# Patient Record
Sex: Male | Born: 1955 | Race: White | Hispanic: No | Marital: Married | Smoking: Never smoker
Health system: Southern US, Community
[De-identification: ages and names within clinical notes are randomized; demographics above are authoritative.]

## PROBLEM LIST (undated history)

## (undated) DIAGNOSIS — E785 Hyperlipidemia, unspecified: Secondary | ICD-10-CM

## (undated) DIAGNOSIS — E559 Vitamin D deficiency, unspecified: Secondary | ICD-10-CM

## (undated) DIAGNOSIS — E875 Hyperkalemia: Secondary | ICD-10-CM

## (undated) DIAGNOSIS — E46 Unspecified protein-calorie malnutrition: Secondary | ICD-10-CM

## (undated) DIAGNOSIS — I10 Essential (primary) hypertension: Secondary | ICD-10-CM

## (undated) DIAGNOSIS — M792 Neuralgia and neuritis, unspecified: Secondary | ICD-10-CM

## (undated) DIAGNOSIS — A419 Sepsis, unspecified organism: Secondary | ICD-10-CM

## (undated) DIAGNOSIS — F32A Depression, unspecified: Secondary | ICD-10-CM

## (undated) DIAGNOSIS — F329 Major depressive disorder, single episode, unspecified: Secondary | ICD-10-CM

## (undated) DIAGNOSIS — E538 Deficiency of other specified B group vitamins: Secondary | ICD-10-CM

## (undated) DIAGNOSIS — M51379 Other intervertebral disc degeneration, lumbosacral region without mention of lumbar back pain or lower extremity pain: Secondary | ICD-10-CM

## (undated) DIAGNOSIS — E8809 Other disorders of plasma-protein metabolism, not elsewhere classified: Secondary | ICD-10-CM

## (undated) DIAGNOSIS — M5137 Other intervertebral disc degeneration, lumbosacral region: Secondary | ICD-10-CM

## (undated) DIAGNOSIS — K5900 Constipation, unspecified: Secondary | ICD-10-CM

## (undated) DIAGNOSIS — D62 Acute posthemorrhagic anemia: Secondary | ICD-10-CM

## (undated) DIAGNOSIS — K649 Unspecified hemorrhoids: Secondary | ICD-10-CM

## (undated) DIAGNOSIS — N319 Neuromuscular dysfunction of bladder, unspecified: Secondary | ICD-10-CM

## (undated) DIAGNOSIS — G894 Chronic pain syndrome: Secondary | ICD-10-CM

## (undated) DIAGNOSIS — N179 Acute kidney failure, unspecified: Secondary | ICD-10-CM

## (undated) DIAGNOSIS — S2242XA Multiple fractures of ribs, left side, initial encounter for closed fracture: Secondary | ICD-10-CM

## (undated) DIAGNOSIS — K592 Neurogenic bowel, not elsewhere classified: Secondary | ICD-10-CM

## (undated) DIAGNOSIS — M109 Gout, unspecified: Secondary | ICD-10-CM

## (undated) DIAGNOSIS — K920 Hematemesis: Secondary | ICD-10-CM

## (undated) DIAGNOSIS — R6521 Severe sepsis with septic shock: Secondary | ICD-10-CM

## (undated) DIAGNOSIS — F419 Anxiety disorder, unspecified: Secondary | ICD-10-CM

## (undated) HISTORY — DX: Deficiency of other specified B group vitamins: E53.8

## (undated) HISTORY — DX: Neuralgia and neuritis, unspecified: M79.2

## (undated) HISTORY — DX: Neurogenic bowel, not elsewhere classified: K59.2

## (undated) HISTORY — DX: Other intervertebral disc degeneration, lumbosacral region without mention of lumbar back pain or lower extremity pain: M51.379

## (undated) HISTORY — DX: Gout, unspecified: M10.9

## (undated) HISTORY — DX: Neuromuscular dysfunction of bladder, unspecified: N31.9

## (undated) HISTORY — DX: Anxiety disorder, unspecified: F41.9

## (undated) HISTORY — DX: Depression, unspecified: F32.A

## (undated) HISTORY — DX: Other disorders of plasma-protein metabolism, not elsewhere classified: E88.09

## (undated) HISTORY — DX: Chronic pain syndrome: G89.4

## (undated) HISTORY — DX: Acute posthemorrhagic anemia: D62

## (undated) HISTORY — DX: Unspecified hemorrhoids: K64.9

## (undated) HISTORY — DX: Vitamin D deficiency, unspecified: E55.9

## (undated) HISTORY — DX: Hyperlipidemia, unspecified: E78.5

## (undated) HISTORY — DX: Constipation, unspecified: K59.00

## (undated) HISTORY — DX: Unspecified protein-calorie malnutrition: E46

## (undated) HISTORY — PX: BACK SURGERY: SHX140

## (undated) HISTORY — DX: Acute kidney failure, unspecified: N17.9

## (undated) HISTORY — DX: Multiple fractures of ribs, left side, initial encounter for closed fracture: S22.42XA

## (undated) HISTORY — DX: Essential (primary) hypertension: I10

## (undated) HISTORY — PX: TONSILLECTOMY: SUR1361

## (undated) HISTORY — DX: Major depressive disorder, single episode, unspecified: F32.9

## (undated) HISTORY — DX: Other intervertebral disc degeneration, lumbosacral region: M51.37

## (undated) HISTORY — DX: Severe sepsis with septic shock: A41.9

## (undated) HISTORY — DX: Hyperkalemia: E87.5

## (undated) HISTORY — DX: Hematemesis: K92.0

## (undated) HISTORY — DX: Sepsis, unspecified organism: A41.9

## (undated) HISTORY — DX: Sepsis, unspecified organism: R65.21

## (undated) HISTORY — PX: APPENDECTOMY: SHX54

---

## 2000-01-16 ENCOUNTER — Emergency Department (HOSPITAL_COMMUNITY): Admission: EM | Admit: 2000-01-16 | Discharge: 2000-01-16 | Payer: Self-pay | Admitting: Emergency Medicine

## 2000-01-16 ENCOUNTER — Encounter: Payer: Self-pay | Admitting: Emergency Medicine

## 2001-09-22 ENCOUNTER — Encounter: Payer: Self-pay | Admitting: *Deleted

## 2001-09-22 ENCOUNTER — Ambulatory Visit (HOSPITAL_COMMUNITY): Admission: RE | Admit: 2001-09-22 | Discharge: 2001-09-22 | Payer: Self-pay | Admitting: *Deleted

## 2007-07-17 ENCOUNTER — Encounter: Admission: RE | Admit: 2007-07-17 | Discharge: 2007-07-17 | Payer: Self-pay | Admitting: Orthopedic Surgery

## 2008-06-24 HISTORY — PX: CHOLECYSTECTOMY: SHX55

## 2011-04-24 DIAGNOSIS — S68121A 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, initial encounter: Secondary | ICD-10-CM | POA: Insufficient documentation

## 2013-04-20 DIAGNOSIS — S2242XA Multiple fractures of ribs, left side, initial encounter for closed fracture: Secondary | ICD-10-CM

## 2013-04-20 HISTORY — DX: Multiple fractures of ribs, left side, initial encounter for closed fracture: S22.42XA

## 2013-04-22 ENCOUNTER — Encounter: Payer: Self-pay | Admitting: *Deleted

## 2013-04-22 DIAGNOSIS — E559 Vitamin D deficiency, unspecified: Secondary | ICD-10-CM | POA: Insufficient documentation

## 2013-04-22 DIAGNOSIS — S2242XA Multiple fractures of ribs, left side, initial encounter for closed fracture: Secondary | ICD-10-CM

## 2013-04-22 DIAGNOSIS — F329 Major depressive disorder, single episode, unspecified: Secondary | ICD-10-CM | POA: Insufficient documentation

## 2013-04-22 DIAGNOSIS — M109 Gout, unspecified: Secondary | ICD-10-CM | POA: Insufficient documentation

## 2013-04-22 DIAGNOSIS — E785 Hyperlipidemia, unspecified: Secondary | ICD-10-CM | POA: Insufficient documentation

## 2013-04-22 DIAGNOSIS — I10 Essential (primary) hypertension: Secondary | ICD-10-CM | POA: Insufficient documentation

## 2013-04-22 DIAGNOSIS — F418 Other specified anxiety disorders: Secondary | ICD-10-CM | POA: Insufficient documentation

## 2013-04-22 DIAGNOSIS — E538 Deficiency of other specified B group vitamins: Secondary | ICD-10-CM | POA: Insufficient documentation

## 2013-04-22 DIAGNOSIS — M5137 Other intervertebral disc degeneration, lumbosacral region: Secondary | ICD-10-CM | POA: Insufficient documentation

## 2013-04-22 DIAGNOSIS — F419 Anxiety disorder, unspecified: Secondary | ICD-10-CM | POA: Insufficient documentation

## 2013-04-22 DIAGNOSIS — K649 Unspecified hemorrhoids: Secondary | ICD-10-CM | POA: Insufficient documentation

## 2013-04-23 ENCOUNTER — Other Ambulatory Visit: Payer: Self-pay | Admitting: *Deleted

## 2013-04-23 ENCOUNTER — Institutional Professional Consult (permissible substitution) (INDEPENDENT_AMBULATORY_CARE_PROVIDER_SITE_OTHER): Payer: 59 | Admitting: Cardiothoracic Surgery

## 2013-04-23 ENCOUNTER — Encounter: Payer: Self-pay | Admitting: Cardiothoracic Surgery

## 2013-04-23 ENCOUNTER — Ambulatory Visit
Admission: RE | Admit: 2013-04-23 | Discharge: 2013-04-23 | Disposition: A | Discharge status: Home / Self Care | Payer: 59 | Source: Ambulatory Visit | Attending: Cardiothoracic Surgery | Admitting: Cardiothoracic Surgery

## 2013-04-23 VITALS — BP 124/85 | HR 109 | Resp 19 | Ht 68.0 in | Wt 227.0 lb

## 2013-04-23 DIAGNOSIS — S2249XA Multiple fractures of ribs, unspecified side, initial encounter for closed fracture: Secondary | ICD-10-CM

## 2013-04-23 DIAGNOSIS — S2239XA Fracture of one rib, unspecified side, initial encounter for closed fracture: Secondary | ICD-10-CM

## 2013-04-23 DIAGNOSIS — S2232XA Fracture of one rib, left side, initial encounter for closed fracture: Secondary | ICD-10-CM

## 2013-04-23 MED ORDER — OXYCODONE HCL 5 MG PO TABS: 5.0000 mg | ORAL_TABLET | ORAL | Status: DC | PRN

## 2013-04-23 NOTE — Progress Notes (Addendum)
[address]       [place] [address]             [phone]                    [facility] Medical Record #[id number] Date of Birth: [birth year]  Referring: Konrad Felix, MD Primary Care: Konrad Felix, MD  Chief Complaint:    Chief Complaint  Patient presents with  . Rib Fracture    thoracic rib pain, rib fractures    History of Present Illness:    Patient fell about 6 feet off ladder hitting his left side while cleaning gutters. This occurred on the 10/28. Chest xray in [place] showed multiple rib fractures. Patient doing well since without SOB , some tenderness. No hemoptysis or abdominal pain. Patient to have back surgery by Dr  Jola Babinski in 3 weeks.      Current Activity/ Functional Status:  Patient is independent with mobility/ambulation, transfers, ADL's, IADL's.  Zubrod Score: At the time of surgery this patient's most appropriate activity status/level should be described as: [x]  Normal activity, no symptoms []  Symptoms, fully ambulatory []  Symptoms, in bed less than or equal to 50% of the time []  Symptoms, in bed greater than 50% of the time but less than 100% []  Bedridden []  Moribund   Past Medical History  Diagnosis Date  . Anxiety   . Cobalamin deficiency   . Degeneration of lumbar or lumbosacral intervertebral disc   . Hemorrhoids   . Hyperlipidemia   . Hypertension   . Depression   . Multiple fractures of ribs of left side 04/20/13    FALL FROM A LADDER ON   . Gouty arthropathy, unspecified   . Vitamin D deficiency     Past Surgical History  Procedure Laterality Date  . Cholecystectomy  2010    [facility]  Marland Kitchen Appendectomy    . Tonsillectomy      Family History  Problem Relation Age of Onset  . Cancer Mother     BREAST  . Cancer Father     COLON  . Cancer Son     LEUKEMIA    History   Social History  . Marital Status: Married    Spouse Name: N/A    Number of Children: N/A  . Years of  Education: N/A   Occupational History  . Not on file.   Social History Main Topics  . Smoking status: Never Smoker   . Smokeless tobacco: Current User    Types: Chew  . Alcohol Use: Yes     Comment: 6-8 BEERS A NIGHT  . Drug Use: Not on file  . Sexual Activity: Not on file   Other Topics Concern  . Not on file   Social History Narrative  . No narrative on file    History  Smoking status  . Never Smoker   Smokeless tobacco  . Current User  . Types: Chew    History  Alcohol Use  . Yes    Comment: 6-8 BEERS A NIGHT     No Known Allergies  Current Outpatient Prescriptions  Medication Sig Dispense Refill  . allopurinol (ZYLOPRIM) 100 MG tablet Take 300 mg by mouth daily.      . B-12, Methylcobalamin, 1000 MCG SUBL Place 1 tablet under the tongue.      . celecoxib (CELEBREX) 200 MG capsule Take 200 mg by mouth daily. May take one or two daily      .  cholecalciferol (VITAMIN D) 1000 UNITS tablet Take 1,000 Units by mouth daily. TAKE 2,000 UNITS A DAY      . HYDROcodone-acetaminophen (NORCO/VICODIN) 5-325 MG per tablet Take 1 tablet by mouth every 6 (six) hours as needed for pain.      Marland Kitchen lisinopril (PRINIVIL,ZESTRIL) 40 MG tablet Take 40 mg by mouth daily.      . Multiple Vitamin (MULTIVITAMIN) capsule Take 1 capsule by mouth daily.      . mupirocin ointment (BACTROBAN) 2 %       . phenylephrine-shark liver oil-mineral oil-petrolatum (PREPARATION H) 0.25-3-14-71.9 % rectal ointment Place rectally 2 (two) times daily as needed for hemorrhoids.      . pravastatin (PRAVACHOL) 40 MG tablet Take 40 mg by mouth daily.      Marland Kitchen starch (ANUSOL) 51 % suppository Place 1 suppository rectally as needed for pain.      Marland Kitchen venlafaxine (EFFEXOR) 75 MG tablet Take 75 mg by mouth 2 (two) times daily.      Marland Kitchen oxyCODONE (OXY IR/ROXICODONE) 5 MG immediate release tablet Take 1 tablet (5 mg total) by mouth every 4 (four) hours as needed for pain.  60 tablet  0   No current facility-administered  medications for this visit.       Review of Systems:     Cardiac Review of Systems: Y or N  Chest Pain [  y  ]  Resting SOB [n   ] Exertional SOB  [n  ]  Orthopnea [ n ]   Pedal Edema [n   ]    Palpitations [n  ] Syncope  [  n]   Presyncope [  n ]  General Review of Systems: [Y] = yes [  ]=no Constitional: recent weight change [ n ]; anorexia [  ]; fatigue Cove.Etienne  ]; nausea [  n]; night sweats [  ]; fever [ n ]; or chills [ n ];                                                                                                                                          Dental: poor dentition[  ]; Last Dentist visit:   Eye : blurred vision [  ]; diplopia [   ]; vision changes [  ];  Amaurosis fugax[  ]; Resp: cough [  ];  wheezing[  ];  hemoptysis[  ]; shortness of breath[n  ]; paroxysmal nocturnal dyspnea[ n ]; dyspnea on exertion[  ]; or orthopnea[  ];  GI:  gallstones[  ], vomiting[  ];  dysphagia[ n ]; melena[  ];  hematochezia [  ]; heartburn[  ];   Hx of  Colonoscopy[  ]; GU: kidney stones [  ]; hematuria[  ];   dysuria [  ];  nocturia[  ];  history of     obstruction [  ]; urinary frequency [ n ]  Skin: rash, swelling[  ];, hair loss[  ];  peripheral edema[  ];  or itching[  ]; Musculosketetal: myalgias[  ];  joint swelling[  ];  joint erythema[  ];  joint pain[  ];  back pain[y  ];  Heme/Lymph: bruising[  ];  bleeding[  ];  anemia[  ];  Neuro: TIA[  ];  headaches[  ];  stroke[  ];  vertigo[  ];  seizures[n  ];   paresthesias[  ];  difficulty walking[n  ];  Psych:depression[  ]; anxiety[  ];  Endocrine: diabetes[  ];  thyroid dysfunction[  ];  Immunizations: Flu [ y ]; Pneumococcal[ n ];  Other:  Physical Exam: BP 124/85  Pulse 109  Resp 19  Ht 5\' 8"  (1.727 m)  Wt 227 lb (102.967 kg)  BMI 34.52 kg/m2  SpO2 97%  General appearance: alert and cooperative Neurologic: intact Heart: regular rate and rhythm, S1, S2 normal, no murmur, click, rub or gallop and normal apical  impulse Lungs: clear to auscultation bilaterally and tenderness left upper ribs, minor brusing over 7-8 ribs Abdomen: soft, non-tender; bowel sounds normal; no masses,  no organomegaly, no tenderness over spleen Extremities: extremities normal, atraumatic, no cyanosis or edema, Homans sign is negative, no sign of DVT and brusing on medial surface of left arm, no joint tenderness brusing over right abdominal wall   Diagnostic Studies & Laboratory data:     Recent Radiology Findings:   Dg Chest 2 View  04/23/2013   CLINICAL DATA:  Left-sided rib fractures.  EXAM: CHEST  2 VIEW  COMPARISON:  Chest x-ray 04/21/2013.  FINDINGS: The left 4th through 8th ribs are fractured. Mild displacement is noted. There is no pneumothorax. The heart size is normal. Mild bibasilar atelectasis is evident. A small effusion is present on the left. Mild degenerative changes are noted in the thoracic spine.  IMPRESSION: 1. Multiple left-sided rib fractures are mildly displaced. 2. No pneumothorax. 3. Small left pleural effusion and associated atelectasis.   Electronically Signed   By: Gennette Pac M.D.   On: 04/23/2013 14:09      Recent Lab Findings: No results found for this basename: WBC, HGB, HCT, PLT, GLUCOSE, CHOL, TRIG, HDL, LDLDIRECT, LDLCALC, ALT, AST, NA, K, CL, CREATININE, BUN, CO2, TSH, INR, GLUF, HGBA1C      Assessment / Plan:     Multiple left rib fractures, conservative rx with pain meds, follow up chest xray 2 weeks, no indication for rib plating, no working with heavy lifting for 2 weeks  . Anxiety   . Cobalamin deficiency   . Degeneration of lumbar or lumbosacral intervertebral disc   . Hemorrhoids   . Hyperlipidemia   . Hypertension   . Depression   . Gouty arthropathy, unspecified   . Vitamin D deficiency   . Multiple fractures of ribs of left side 04/20/2013    Delight Ovens MD      [address] [place] [place] [address] Office [phone]   Beeper  [phone]  04/23/2013 4:00 PM

## 2013-04-23 NOTE — Patient Instructions (Signed)
Rib Fracture Your caregiver has diagnosed you as having a rib fracture (a break). This can occur by a blow to the chest, by a fall against a hard object, or by violent coughing or sneezing. There may be one or many breaks. Rib fractures may heal on their own within 3 to 8 weeks. The longer healing period is usually associated with a continued cough or other aggravating activities. HOME CARE INSTRUCTIONS   Avoid strenuous activity. Be careful during activities and avoid bumping the injured rib. Activities that cause pain pull on the fracture site(s) and are best avoided if possible.  Eat a normal, well-balanced diet. Drink plenty of fluids to avoid constipation.  Take deep breaths several times a day to keep lungs free of infection. Try to cough several times a day, splinting the injured area with a pillow. This will help prevent pneumonia.  Do not wear a rib belt or binder. These restrict breathing which can lead to pneumonia.  Only take over-the-counter or prescription medicines for pain, discomfort, or fever as directed by your caregiver. SEEK MEDICAL CARE IF:  You develop a continual cough, associated with thick or bloody sputum. SEEK IMMEDIATE MEDICAL CARE IF:   You have a fever.  You have difficulty breathing.  You have nausea (feeling sick to your stomach), vomiting, or abdominal (belly) pain.  You have worsening pain, not controlled with medications. Document Released: 06/10/2005 Document Revised: 09/02/2011 Document Reviewed: 11/12/2006 [facility] Patient Information 2014 [place], [place].  Constipation, Adult Constipation is when a person has fewer than 3 bowel movements a week; has difficulty having a bowel movement; or has stools that are dry, hard, or larger than normal. As people grow older, constipation is more common. If you try to fix constipation with medicines that make you have a bowel movement (laxatives), the problem may get worse. Long-term laxative use may cause the  muscles of the colon to become weak. A low-fiber diet, not taking in enough fluids, and taking certain medicines may make constipation worse. CAUSES   Certain medicines, such as antidepressants, pain medicine, iron supplements, antacids, and water pills.   Certain diseases, such as diabetes, irritable bowel syndrome (IBS), thyroid disease, or depression.   Not drinking enough water.   Not eating enough fiber-rich foods.   Stress or travel.  Lack of physical activity or exercise.  Not going to the restroom when there is the urge to have a bowel movement.  Ignoring the urge to have a bowel movement.  Using laxatives too much. SYMPTOMS   Having fewer than 3 bowel movements a week.   Straining to have a bowel movement.   Having hard, dry, or larger than normal stools.   Feeling full or bloated.   Pain in the lower abdomen.  Not feeling relief after having a bowel movement. DIAGNOSIS  Your caregiver will take a medical history and perform a physical exam. Further testing may be done for severe constipation. Some tests may include:   A barium enema X-ray to examine your rectum, colon, and sometimes, your small intestine.  A sigmoidoscopy to examine your lower colon.  A colonoscopy to examine your entire colon. TREATMENT  Treatment will depend on the severity of your constipation and what is causing it. Some dietary treatments include drinking more fluids and eating more fiber-rich foods. Lifestyle treatments may include regular exercise. If these diet and lifestyle recommendations do not help, your caregiver may recommend taking over-the-counter laxative medicines to help you have bowel movements. Prescription medicines may  be prescribed if over-the-counter medicines do not work.  HOME CARE INSTRUCTIONS   Increase dietary fiber in your diet, such as fruits, vegetables, whole grains, and beans. Limit high-fat and processed sugars in your diet, such as [place] fries,  hamburgers, cookies, candies, and soda.   A fiber supplement may be added to your diet if you cannot get enough fiber from foods.   Drink enough fluids to keep your urine clear or pale yellow.   Exercise regularly or as directed by your caregiver.   Go to the restroom when you have the urge to go. Do not hold it.  Only take medicines as directed by your caregiver. Do not take other medicines for constipation without talking to your caregiver first. SEEK IMMEDIATE MEDICAL CARE IF:   You have bright red blood in your stool.   Your constipation lasts for more than 4 days or gets worse.   You have abdominal or rectal pain.   You have thin, pencil-like stools.  You have unexplained weight loss. MAKE SURE YOU:   Understand these instructions.  Will watch your condition.  Will get help right away if you are not doing well or get worse. Document Released: 03/08/2004 Document Revised: 09/02/2011 Document Reviewed: 05/14/2011 [facility] Patient Information 2014 [place], [place].

## 2013-05-06 ENCOUNTER — Ambulatory Visit
Admission: RE | Admit: 2013-05-06 | Discharge: 2013-05-06 | Disposition: A | Discharge status: Home / Self Care | Payer: 59 | Source: Ambulatory Visit | Attending: Cardiothoracic Surgery | Admitting: Cardiothoracic Surgery

## 2013-05-06 ENCOUNTER — Encounter: Payer: Self-pay | Admitting: Cardiothoracic Surgery

## 2013-05-06 ENCOUNTER — Ambulatory Visit (INDEPENDENT_AMBULATORY_CARE_PROVIDER_SITE_OTHER): Payer: 59 | Admitting: Cardiothoracic Surgery

## 2013-05-06 VITALS — BP 121/86 | HR 98 | Resp 19 | Ht 68.0 in | Wt 227.0 lb

## 2013-05-06 DIAGNOSIS — S2249XA Multiple fractures of ribs, unspecified side, initial encounter for closed fracture: Secondary | ICD-10-CM

## 2013-05-06 DIAGNOSIS — S2232XA Fracture of one rib, left side, initial encounter for closed fracture: Secondary | ICD-10-CM

## 2013-05-06 DIAGNOSIS — S2239XA Fracture of one rib, unspecified side, initial encounter for closed fracture: Secondary | ICD-10-CM

## 2013-05-06 NOTE — Progress Notes (Signed)
[facility] Medical Record #[id number] Date of Birth: 1955/08/17  Referring: Konrad Felix, MD Primary Care: Konrad Felix, MD  Chief Complaint:    Chief Complaint  Patient presents with  . Rib Fracture    thoracic rib pain, rib fractures     History of Present Illness:    Patient fell about 6 feet off ladder hitting his left side while cleaning gutters. This occurred on the 10/28. Chest xray in [place] showed multiple rib fractures. He now comes in 2 weeks after his fall for a followup chest x-ray. He's returned to near-normal activity level fairly rapidly. He notes that he was out cleaning up his yard and raking leaves yesterday without difficulty. He's avoiding the roof. He's had no significant shortness of breath.     Current Activity/ Functional Status:  Patient is independent with mobility/ambulation, transfers, ADL's, IADL's.  Zubrod Score: At the time of surgery this patient's most appropriate activity status/level should be described as: [x]  Normal activity, no symptoms []  Symptoms, fully ambulatory []  Symptoms, in bed less than or equal to 50% of the time []  Symptoms, in bed greater than 50% of the time but less than 100% []  Bedridden []  Moribund   Past Medical History  Diagnosis Date  . Anxiety   . Cobalamin deficiency   . Degeneration of lumbar or lumbosacral intervertebral disc   . Hemorrhoids   . Hyperlipidemia   . Hypertension   . Depression   . Multiple fractures of ribs of left side 04/20/13    FALL FROM A LADDER ON   . Gouty arthropathy, unspecified   . Vitamin D deficiency     Past Surgical History  Procedure Laterality Date  . Cholecystectomy  2010    [facility]  Marland Kitchen Appendectomy    . Tonsillectomy      Family History  Problem Relation Age of Onset  . Cancer Mother     BREAST  . Cancer Father     COLON  . Cancer Son     LEUKEMIA    History   Social History  . Marital Status: Married    Spouse Name: N/A     Number of Children: N/A  . Years of Education: N/A   Occupational History  . Not on file.   Social History Main Topics  . Smoking status: Never Smoker   . Smokeless tobacco: Current User    Types: Chew  . Alcohol Use: Yes     Comment: 6-8 BEERS A NIGHT  . Drug Use: Not on file  . Sexual Activity: Not on file   Other Topics Concern  . Not on file   Social History Narrative  . No narrative on file    History  Smoking status  . Never Smoker   Smokeless tobacco  . Current User  . Types: Chew    History  Alcohol Use  . Yes    Comment: 6-8 BEERS A NIGHT     No Known Allergies  Current Outpatient Prescriptions  Medication Sig Dispense Refill  . allopurinol (ZYLOPRIM) 100 MG tablet Take 300 mg by mouth daily.      . B-12, Methylcobalamin, 1000 MCG SUBL Place 1 tablet under the tongue.      . celecoxib (CELEBREX) 200 MG capsule Take 200 mg by mouth daily. May take one or two daily      . cholecalciferol (VITAMIN D) 1000 UNITS tablet Take 1,000 Units by mouth daily. TAKE  2,000 UNITS A DAY      . HYDROcodone-acetaminophen (NORCO/VICODIN) 5-325 MG per tablet Take 1 tablet by mouth every 6 (six) hours as needed for pain.      Marland Kitchen lisinopril (PRINIVIL,ZESTRIL) 40 MG tablet Take 40 mg by mouth daily.      . Multiple Vitamin (MULTIVITAMIN) capsule Take 1 capsule by mouth daily.      . mupirocin ointment (BACTROBAN) 2 %       . oxyCODONE (OXY IR/ROXICODONE) 5 MG immediate release tablet Take 1 tablet (5 mg total) by mouth every 4 (four) hours as needed for pain.  60 tablet  0  . phenylephrine-shark liver oil-mineral oil-petrolatum (PREPARATION H) 0.25-3-14-71.9 % rectal ointment Place rectally 2 (two) times daily as needed for hemorrhoids.      . pravastatin (PRAVACHOL) 40 MG tablet Take 40 mg by mouth daily.      Marland Kitchen starch (ANUSOL) 51 % suppository Place 1 suppository rectally as needed for pain.      Marland Kitchen venlafaxine (EFFEXOR) 75 MG tablet Take 75 mg by mouth 2 (two) times daily.        No current facility-administered medications for this visit.       Review of Systems:     Cardiac Review of Systems: Y or N  Chest Pain [  y  ]  Resting SOB [n   ] Exertional SOB  [n  ]  Orthopnea [ n ]   Pedal Edema [n   ]    Palpitations [n  ] Syncope  [  n]   Presyncope [  n ]  General Review of Systems: [Y] = yes [  ]=no Constitional: recent weight change [ n ]; anorexia [  ]; fatigue Cove.Etienne  ]; nausea [  n]; night sweats [  ]; fever [ n ]; or chills [ n ];                                                                                                                                          Dental: poor dentition[  ]; Last Dentist visit:   Eye : blurred vision [  ]; diplopia [   ]; vision changes [  ];  Amaurosis fugax[  ]; Resp: cough [  ];  wheezing[  ];  hemoptysis[  ]; shortness of breath[n  ]; paroxysmal nocturnal dyspnea[ n ]; dyspnea on exertion[  ]; or orthopnea[  ];  GI:  gallstones[  ], vomiting[  ];  dysphagia[ n ]; melena[  ];  hematochezia [  ]; heartburn[  ];   Hx of  Colonoscopy[  ]; GU: kidney stones [  ]; hematuria[  ];   dysuria [  ];  nocturia[  ];  history of     obstruction [  ]; urinary frequency [ n ]             Skin: rash,  swelling[  ];, hair loss[  ];  peripheral edema[  ];  or itching[  ]; Musculosketetal: myalgias[  ];  joint swelling[  ];  joint erythema[  ];  joint pain[  ];  back pain[y  ];  Heme/Lymph: bruising[  ];  bleeding[  ];  anemia[  ];  Neuro: TIA[  ];  headaches[  ];  stroke[  ];  vertigo[  ];  seizures[n  ];   paresthesias[  ];  difficulty walking[n  ];  Psych:depression[  ]; anxiety[  ];  Endocrine: diabetes[  ];  thyroid dysfunction[  ];  Immunizations: Flu [ y ]; Pneumococcal[ n ];  Other:  Physical Exam: BP 121/86  Pulse 98  Resp 19  Ht 5\' 8"  (1.727 m)  Wt 227 lb (102.967 kg)  BMI 34.52 kg/m2  SpO2 96%  General appearance: alert and cooperative Neurologic: intact Heart: regular rate and rhythm, S1, S2 normal, no murmur,  click, rub or gallop and normal apical impulse Lungs: clear to auscultation bilaterally and tenderness left upper ribs, minor brusing over 7-8 ribs , has some mild tenderness over the left lower lateral rib but otherwise no chest wall abnormality Abdomen: soft, non-tender; bowel sounds normal; no masses,  no organomegaly, no tenderness over spleen Extremities: extremities normal, atraumatic, no cyanosis or edema, Homans sign is negative, no sign of DVT and brusing on medial surface of left arm, no joint tenderness brusing over right abdominal wall   Diagnostic Studies & Laboratory data:     Recent Radiology Findings:   Dg Chest 2 View  05/06/2013   CLINICAL DATA:  Followup multiple rib fractures.  EXAM: CHEST  2 VIEW  COMPARISON:  04/23/2013  FINDINGS: Multiple left rib fractures are again noted. Small left pleural effusion, slightly increased since prior study. Increasing left base atelectasis. Right lung is clear. Heart is upper limits normal in size.  IMPRESSION: Multiple left rib fractures with slight increase in the small left pleural effusion and left base atelectasis.   Electronically Signed   By: Charlett Nose M.D.   On: 05/06/2013 13:33      Recent Lab Findings: No results found for this basename: WBC,  HGB,  HCT,  PLT,  GLUCOSE,  CHOL,  TRIG,  HDL,  LDLDIRECT,  LDLCALC,  ALT,  AST,  NA,  K,  CL,  CREATININE,  BUN,  CO2,  TSH,  INR,  GLUF,  HGBA1C      Assessment / Plan:     Multiple left rib fractures, conservative rx with pain meds, with stable follow up chest xray 2 weeks after the injury,  Patient was allowed to return to work Plan to see back when necessary  . Anxiety   . Cobalamin deficiency   . Degeneration of lumbar or lumbosacral intervertebral disc   . Hemorrhoids   . Hyperlipidemia   . Hypertension   . Depression   . Gouty arthropathy, unspecified   . Vitamin D deficiency   . Multiple fractures of ribs of left side 04/20/2013    Delight Ovens MD        [address] [place] [place] [address] Office [phone]   Beeper [phone]  05/06/2013 1:38 PM

## 2013-11-03 DIAGNOSIS — M47816 Spondylosis without myelopathy or radiculopathy, lumbar region: Secondary | ICD-10-CM | POA: Insufficient documentation

## 2013-11-03 DIAGNOSIS — M533 Sacrococcygeal disorders, not elsewhere classified: Secondary | ICD-10-CM | POA: Insufficient documentation

## 2013-11-03 HISTORY — DX: Spondylosis without myelopathy or radiculopathy, lumbar region: M47.816

## 2014-01-07 DIAGNOSIS — M25551 Pain in right hip: Secondary | ICD-10-CM | POA: Insufficient documentation

## 2014-01-07 DIAGNOSIS — M1611 Unilateral primary osteoarthritis, right hip: Secondary | ICD-10-CM | POA: Insufficient documentation

## 2014-03-02 DIAGNOSIS — M5126 Other intervertebral disc displacement, lumbar region: Secondary | ICD-10-CM | POA: Insufficient documentation

## 2014-03-02 DIAGNOSIS — Z6835 Body mass index (BMI) 35.0-35.9, adult: Secondary | ICD-10-CM | POA: Insufficient documentation

## 2016-12-31 DIAGNOSIS — I493 Ventricular premature depolarization: Secondary | ICD-10-CM | POA: Diagnosis not present

## 2016-12-31 DIAGNOSIS — R002 Palpitations: Secondary | ICD-10-CM | POA: Diagnosis not present

## 2017-08-05 DIAGNOSIS — E291 Testicular hypofunction: Secondary | ICD-10-CM | POA: Diagnosis not present

## 2017-08-06 DIAGNOSIS — M47816 Spondylosis without myelopathy or radiculopathy, lumbar region: Secondary | ICD-10-CM | POA: Diagnosis not present

## 2017-08-06 DIAGNOSIS — M25552 Pain in left hip: Secondary | ICD-10-CM | POA: Diagnosis not present

## 2017-08-06 DIAGNOSIS — M533 Sacrococcygeal disorders, not elsewhere classified: Secondary | ICD-10-CM | POA: Diagnosis not present

## 2017-08-06 DIAGNOSIS — G894 Chronic pain syndrome: Secondary | ICD-10-CM | POA: Diagnosis not present

## 2017-09-10 DIAGNOSIS — M25552 Pain in left hip: Secondary | ICD-10-CM | POA: Diagnosis not present

## 2017-09-10 DIAGNOSIS — M533 Sacrococcygeal disorders, not elsewhere classified: Secondary | ICD-10-CM | POA: Diagnosis not present

## 2017-09-10 DIAGNOSIS — M47816 Spondylosis without myelopathy or radiculopathy, lumbar region: Secondary | ICD-10-CM | POA: Diagnosis not present

## 2017-09-10 DIAGNOSIS — G894 Chronic pain syndrome: Secondary | ICD-10-CM | POA: Diagnosis not present

## 2017-09-10 DIAGNOSIS — M5136 Other intervertebral disc degeneration, lumbar region: Secondary | ICD-10-CM | POA: Diagnosis not present

## 2017-09-24 DIAGNOSIS — M533 Sacrococcygeal disorders, not elsewhere classified: Secondary | ICD-10-CM | POA: Diagnosis not present

## 2017-09-24 DIAGNOSIS — M47816 Spondylosis without myelopathy or radiculopathy, lumbar region: Secondary | ICD-10-CM | POA: Diagnosis not present

## 2017-09-30 DIAGNOSIS — E291 Testicular hypofunction: Secondary | ICD-10-CM | POA: Diagnosis not present

## 2017-10-21 DIAGNOSIS — M47816 Spondylosis without myelopathy or radiculopathy, lumbar region: Secondary | ICD-10-CM | POA: Diagnosis not present

## 2017-10-21 DIAGNOSIS — M25552 Pain in left hip: Secondary | ICD-10-CM | POA: Diagnosis not present

## 2017-10-21 DIAGNOSIS — M533 Sacrococcygeal disorders, not elsewhere classified: Secondary | ICD-10-CM | POA: Diagnosis not present

## 2017-10-23 DIAGNOSIS — E291 Testicular hypofunction: Secondary | ICD-10-CM | POA: Diagnosis not present

## 2017-10-23 DIAGNOSIS — Z1211 Encounter for screening for malignant neoplasm of colon: Secondary | ICD-10-CM | POA: Diagnosis not present

## 2017-10-23 DIAGNOSIS — Z1331 Encounter for screening for depression: Secondary | ICD-10-CM | POA: Diagnosis not present

## 2017-10-23 DIAGNOSIS — Z125 Encounter for screening for malignant neoplasm of prostate: Secondary | ICD-10-CM | POA: Diagnosis not present

## 2017-10-23 DIAGNOSIS — Z0000 Encounter for general adult medical examination without abnormal findings: Secondary | ICD-10-CM | POA: Diagnosis not present

## 2017-10-23 DIAGNOSIS — Z79899 Other long term (current) drug therapy: Secondary | ICD-10-CM | POA: Diagnosis not present

## 2017-10-23 DIAGNOSIS — M109 Gout, unspecified: Secondary | ICD-10-CM | POA: Diagnosis not present

## 2017-10-23 DIAGNOSIS — I129 Hypertensive chronic kidney disease with stage 1 through stage 4 chronic kidney disease, or unspecified chronic kidney disease: Secondary | ICD-10-CM | POA: Diagnosis not present

## 2017-10-23 DIAGNOSIS — E785 Hyperlipidemia, unspecified: Secondary | ICD-10-CM | POA: Diagnosis not present

## 2017-10-28 DIAGNOSIS — Z125 Encounter for screening for malignant neoplasm of prostate: Secondary | ICD-10-CM | POA: Diagnosis not present

## 2017-10-28 DIAGNOSIS — Z1331 Encounter for screening for depression: Secondary | ICD-10-CM | POA: Diagnosis not present

## 2017-10-28 DIAGNOSIS — E785 Hyperlipidemia, unspecified: Secondary | ICD-10-CM | POA: Diagnosis not present

## 2017-10-28 DIAGNOSIS — Z0000 Encounter for general adult medical examination without abnormal findings: Secondary | ICD-10-CM | POA: Diagnosis not present

## 2017-10-28 DIAGNOSIS — Z9181 History of falling: Secondary | ICD-10-CM | POA: Diagnosis not present

## 2017-11-19 DIAGNOSIS — G894 Chronic pain syndrome: Secondary | ICD-10-CM | POA: Diagnosis not present

## 2017-11-19 DIAGNOSIS — M25552 Pain in left hip: Secondary | ICD-10-CM | POA: Diagnosis not present

## 2017-11-19 DIAGNOSIS — M533 Sacrococcygeal disorders, not elsewhere classified: Secondary | ICD-10-CM | POA: Diagnosis not present

## 2017-11-19 DIAGNOSIS — M47816 Spondylosis without myelopathy or radiculopathy, lumbar region: Secondary | ICD-10-CM | POA: Diagnosis not present

## 2017-11-25 DIAGNOSIS — N401 Enlarged prostate with lower urinary tract symptoms: Secondary | ICD-10-CM | POA: Diagnosis not present

## 2017-11-25 DIAGNOSIS — I129 Hypertensive chronic kidney disease with stage 1 through stage 4 chronic kidney disease, or unspecified chronic kidney disease: Secondary | ICD-10-CM | POA: Diagnosis not present

## 2017-11-25 DIAGNOSIS — G4700 Insomnia, unspecified: Secondary | ICD-10-CM | POA: Diagnosis not present

## 2017-11-25 DIAGNOSIS — M109 Gout, unspecified: Secondary | ICD-10-CM | POA: Diagnosis not present

## 2017-11-26 DIAGNOSIS — M533 Sacrococcygeal disorders, not elsewhere classified: Secondary | ICD-10-CM | POA: Diagnosis not present

## 2017-11-26 DIAGNOSIS — M47816 Spondylosis without myelopathy or radiculopathy, lumbar region: Secondary | ICD-10-CM | POA: Diagnosis not present

## 2017-12-17 DIAGNOSIS — M533 Sacrococcygeal disorders, not elsewhere classified: Secondary | ICD-10-CM | POA: Diagnosis not present

## 2017-12-17 DIAGNOSIS — M25552 Pain in left hip: Secondary | ICD-10-CM | POA: Diagnosis not present

## 2017-12-17 DIAGNOSIS — M47816 Spondylosis without myelopathy or radiculopathy, lumbar region: Secondary | ICD-10-CM | POA: Diagnosis not present

## 2018-01-08 DIAGNOSIS — R3915 Urgency of urination: Secondary | ICD-10-CM | POA: Diagnosis not present

## 2018-01-08 DIAGNOSIS — E349 Endocrine disorder, unspecified: Secondary | ICD-10-CM | POA: Diagnosis not present

## 2018-01-14 DIAGNOSIS — M47816 Spondylosis without myelopathy or radiculopathy, lumbar region: Secondary | ICD-10-CM | POA: Diagnosis not present

## 2018-01-14 DIAGNOSIS — M25552 Pain in left hip: Secondary | ICD-10-CM | POA: Diagnosis not present

## 2018-01-14 DIAGNOSIS — M533 Sacrococcygeal disorders, not elsewhere classified: Secondary | ICD-10-CM | POA: Diagnosis not present

## 2018-01-21 DIAGNOSIS — M533 Sacrococcygeal disorders, not elsewhere classified: Secondary | ICD-10-CM | POA: Diagnosis not present

## 2018-01-21 DIAGNOSIS — M47816 Spondylosis without myelopathy or radiculopathy, lumbar region: Secondary | ICD-10-CM | POA: Diagnosis not present

## 2018-02-09 DIAGNOSIS — M533 Sacrococcygeal disorders, not elsewhere classified: Secondary | ICD-10-CM | POA: Diagnosis not present

## 2018-02-09 DIAGNOSIS — M47816 Spondylosis without myelopathy or radiculopathy, lumbar region: Secondary | ICD-10-CM | POA: Diagnosis not present

## 2018-02-09 DIAGNOSIS — M25552 Pain in left hip: Secondary | ICD-10-CM | POA: Diagnosis not present

## 2018-02-17 DIAGNOSIS — D1809 Hemangioma of other sites: Secondary | ICD-10-CM | POA: Diagnosis not present

## 2018-02-17 DIAGNOSIS — M47816 Spondylosis without myelopathy or radiculopathy, lumbar region: Secondary | ICD-10-CM | POA: Diagnosis not present

## 2018-02-17 DIAGNOSIS — M5136 Other intervertebral disc degeneration, lumbar region: Secondary | ICD-10-CM | POA: Diagnosis not present

## 2018-02-17 DIAGNOSIS — M5126 Other intervertebral disc displacement, lumbar region: Secondary | ICD-10-CM | POA: Diagnosis not present

## 2018-02-17 DIAGNOSIS — M545 Low back pain: Secondary | ICD-10-CM | POA: Diagnosis not present

## 2018-02-25 DIAGNOSIS — G4700 Insomnia, unspecified: Secondary | ICD-10-CM | POA: Diagnosis not present

## 2018-02-25 DIAGNOSIS — M109 Gout, unspecified: Secondary | ICD-10-CM | POA: Diagnosis not present

## 2018-02-25 DIAGNOSIS — I129 Hypertensive chronic kidney disease with stage 1 through stage 4 chronic kidney disease, or unspecified chronic kidney disease: Secondary | ICD-10-CM | POA: Diagnosis not present

## 2018-02-25 DIAGNOSIS — E785 Hyperlipidemia, unspecified: Secondary | ICD-10-CM | POA: Diagnosis not present

## 2018-03-12 DIAGNOSIS — M48061 Spinal stenosis, lumbar region without neurogenic claudication: Secondary | ICD-10-CM | POA: Diagnosis not present

## 2018-03-12 DIAGNOSIS — M533 Sacrococcygeal disorders, not elsewhere classified: Secondary | ICD-10-CM | POA: Diagnosis not present

## 2018-03-12 DIAGNOSIS — M47816 Spondylosis without myelopathy or radiculopathy, lumbar region: Secondary | ICD-10-CM | POA: Diagnosis not present

## 2018-03-12 DIAGNOSIS — G894 Chronic pain syndrome: Secondary | ICD-10-CM | POA: Diagnosis not present

## 2018-03-31 DIAGNOSIS — Z23 Encounter for immunization: Secondary | ICD-10-CM | POA: Diagnosis not present

## 2018-04-08 DIAGNOSIS — M47816 Spondylosis without myelopathy or radiculopathy, lumbar region: Secondary | ICD-10-CM | POA: Diagnosis not present

## 2018-04-08 DIAGNOSIS — M533 Sacrococcygeal disorders, not elsewhere classified: Secondary | ICD-10-CM | POA: Diagnosis not present

## 2018-04-08 DIAGNOSIS — M48061 Spinal stenosis, lumbar region without neurogenic claudication: Secondary | ICD-10-CM | POA: Diagnosis not present

## 2018-04-21 DIAGNOSIS — E785 Hyperlipidemia, unspecified: Secondary | ICD-10-CM | POA: Diagnosis not present

## 2018-04-22 DIAGNOSIS — M7061 Trochanteric bursitis, right hip: Secondary | ICD-10-CM | POA: Diagnosis not present

## 2018-04-22 DIAGNOSIS — M47816 Spondylosis without myelopathy or radiculopathy, lumbar region: Secondary | ICD-10-CM | POA: Diagnosis not present

## 2018-04-22 DIAGNOSIS — M48061 Spinal stenosis, lumbar region without neurogenic claudication: Secondary | ICD-10-CM | POA: Diagnosis not present

## 2018-04-24 DIAGNOSIS — M25551 Pain in right hip: Secondary | ICD-10-CM | POA: Diagnosis not present

## 2018-04-24 DIAGNOSIS — G8929 Other chronic pain: Secondary | ICD-10-CM | POA: Diagnosis not present

## 2018-05-05 DIAGNOSIS — E785 Hyperlipidemia, unspecified: Secondary | ICD-10-CM | POA: Diagnosis not present

## 2018-05-05 DIAGNOSIS — R740 Nonspecific elevation of levels of transaminase and lactic acid dehydrogenase [LDH]: Secondary | ICD-10-CM | POA: Diagnosis not present

## 2018-05-19 DIAGNOSIS — M47816 Spondylosis without myelopathy or radiculopathy, lumbar region: Secondary | ICD-10-CM | POA: Diagnosis not present

## 2018-05-19 DIAGNOSIS — M25552 Pain in left hip: Secondary | ICD-10-CM | POA: Diagnosis not present

## 2018-05-19 DIAGNOSIS — G894 Chronic pain syndrome: Secondary | ICD-10-CM | POA: Diagnosis not present

## 2018-05-19 DIAGNOSIS — M533 Sacrococcygeal disorders, not elsewhere classified: Secondary | ICD-10-CM | POA: Diagnosis not present

## 2018-06-01 DIAGNOSIS — M6281 Muscle weakness (generalized): Secondary | ICD-10-CM | POA: Diagnosis not present

## 2018-06-01 DIAGNOSIS — R2689 Other abnormalities of gait and mobility: Secondary | ICD-10-CM | POA: Diagnosis not present

## 2018-06-01 DIAGNOSIS — G894 Chronic pain syndrome: Secondary | ICD-10-CM | POA: Diagnosis not present

## 2018-06-01 DIAGNOSIS — M545 Low back pain: Secondary | ICD-10-CM | POA: Diagnosis not present

## 2018-06-01 DIAGNOSIS — M47816 Spondylosis without myelopathy or radiculopathy, lumbar region: Secondary | ICD-10-CM | POA: Diagnosis not present

## 2018-06-01 DIAGNOSIS — M25552 Pain in left hip: Secondary | ICD-10-CM | POA: Diagnosis not present

## 2018-06-01 DIAGNOSIS — M533 Sacrococcygeal disorders, not elsewhere classified: Secondary | ICD-10-CM | POA: Diagnosis not present

## 2018-06-01 DIAGNOSIS — M961 Postlaminectomy syndrome, not elsewhere classified: Secondary | ICD-10-CM | POA: Diagnosis not present

## 2018-06-04 DIAGNOSIS — M545 Low back pain: Secondary | ICD-10-CM | POA: Diagnosis not present

## 2018-06-04 DIAGNOSIS — M6281 Muscle weakness (generalized): Secondary | ICD-10-CM | POA: Diagnosis not present

## 2018-06-04 DIAGNOSIS — R2689 Other abnormalities of gait and mobility: Secondary | ICD-10-CM | POA: Diagnosis not present

## 2018-06-09 DIAGNOSIS — R2689 Other abnormalities of gait and mobility: Secondary | ICD-10-CM | POA: Diagnosis not present

## 2018-06-09 DIAGNOSIS — M545 Low back pain: Secondary | ICD-10-CM | POA: Diagnosis not present

## 2018-06-09 DIAGNOSIS — M6281 Muscle weakness (generalized): Secondary | ICD-10-CM | POA: Diagnosis not present

## 2018-06-15 DIAGNOSIS — M545 Low back pain: Secondary | ICD-10-CM | POA: Diagnosis not present

## 2018-06-15 DIAGNOSIS — M6281 Muscle weakness (generalized): Secondary | ICD-10-CM | POA: Diagnosis not present

## 2018-06-15 DIAGNOSIS — R2689 Other abnormalities of gait and mobility: Secondary | ICD-10-CM | POA: Diagnosis not present

## 2018-06-19 DIAGNOSIS — M6281 Muscle weakness (generalized): Secondary | ICD-10-CM | POA: Diagnosis not present

## 2018-06-19 DIAGNOSIS — R2689 Other abnormalities of gait and mobility: Secondary | ICD-10-CM | POA: Diagnosis not present

## 2018-06-19 DIAGNOSIS — M545 Low back pain: Secondary | ICD-10-CM | POA: Diagnosis not present

## 2018-06-22 DIAGNOSIS — R2689 Other abnormalities of gait and mobility: Secondary | ICD-10-CM | POA: Diagnosis not present

## 2018-06-22 DIAGNOSIS — M6281 Muscle weakness (generalized): Secondary | ICD-10-CM | POA: Diagnosis not present

## 2018-06-22 DIAGNOSIS — M545 Low back pain: Secondary | ICD-10-CM | POA: Diagnosis not present

## 2018-06-29 DIAGNOSIS — M6281 Muscle weakness (generalized): Secondary | ICD-10-CM | POA: Diagnosis not present

## 2018-06-29 DIAGNOSIS — R2689 Other abnormalities of gait and mobility: Secondary | ICD-10-CM | POA: Diagnosis not present

## 2018-06-29 DIAGNOSIS — M545 Low back pain: Secondary | ICD-10-CM | POA: Diagnosis not present

## 2018-07-01 DIAGNOSIS — G894 Chronic pain syndrome: Secondary | ICD-10-CM | POA: Diagnosis not present

## 2018-07-01 DIAGNOSIS — M961 Postlaminectomy syndrome, not elsewhere classified: Secondary | ICD-10-CM | POA: Diagnosis not present

## 2018-07-06 DIAGNOSIS — R2689 Other abnormalities of gait and mobility: Secondary | ICD-10-CM | POA: Diagnosis not present

## 2018-07-06 DIAGNOSIS — M6281 Muscle weakness (generalized): Secondary | ICD-10-CM | POA: Diagnosis not present

## 2018-07-06 DIAGNOSIS — M545 Low back pain: Secondary | ICD-10-CM | POA: Diagnosis not present

## 2018-07-09 DIAGNOSIS — M545 Low back pain: Secondary | ICD-10-CM | POA: Diagnosis not present

## 2018-07-09 DIAGNOSIS — M6281 Muscle weakness (generalized): Secondary | ICD-10-CM | POA: Diagnosis not present

## 2018-07-09 DIAGNOSIS — R2689 Other abnormalities of gait and mobility: Secondary | ICD-10-CM | POA: Diagnosis not present

## 2018-07-13 DIAGNOSIS — M6281 Muscle weakness (generalized): Secondary | ICD-10-CM | POA: Diagnosis not present

## 2018-07-13 DIAGNOSIS — M545 Low back pain: Secondary | ICD-10-CM | POA: Diagnosis not present

## 2018-07-13 DIAGNOSIS — R2689 Other abnormalities of gait and mobility: Secondary | ICD-10-CM | POA: Diagnosis not present

## 2018-07-16 DIAGNOSIS — R2689 Other abnormalities of gait and mobility: Secondary | ICD-10-CM | POA: Diagnosis not present

## 2018-07-16 DIAGNOSIS — M545 Low back pain: Secondary | ICD-10-CM | POA: Diagnosis not present

## 2018-07-16 DIAGNOSIS — M6281 Muscle weakness (generalized): Secondary | ICD-10-CM | POA: Diagnosis not present

## 2018-07-21 DIAGNOSIS — I129 Hypertensive chronic kidney disease with stage 1 through stage 4 chronic kidney disease, or unspecified chronic kidney disease: Secondary | ICD-10-CM | POA: Diagnosis not present

## 2018-07-21 DIAGNOSIS — G894 Chronic pain syndrome: Secondary | ICD-10-CM | POA: Diagnosis not present

## 2018-07-21 DIAGNOSIS — M109 Gout, unspecified: Secondary | ICD-10-CM | POA: Diagnosis not present

## 2018-07-21 DIAGNOSIS — E785 Hyperlipidemia, unspecified: Secondary | ICD-10-CM | POA: Diagnosis not present

## 2018-07-23 DIAGNOSIS — R2689 Other abnormalities of gait and mobility: Secondary | ICD-10-CM | POA: Diagnosis not present

## 2018-07-23 DIAGNOSIS — M545 Low back pain: Secondary | ICD-10-CM | POA: Diagnosis not present

## 2018-07-23 DIAGNOSIS — M6281 Muscle weakness (generalized): Secondary | ICD-10-CM | POA: Diagnosis not present

## 2018-07-27 DIAGNOSIS — M545 Low back pain: Secondary | ICD-10-CM | POA: Diagnosis not present

## 2018-07-27 DIAGNOSIS — M6281 Muscle weakness (generalized): Secondary | ICD-10-CM | POA: Diagnosis not present

## 2018-07-27 DIAGNOSIS — R2689 Other abnormalities of gait and mobility: Secondary | ICD-10-CM | POA: Diagnosis not present

## 2018-08-03 DIAGNOSIS — J208 Acute bronchitis due to other specified organisms: Secondary | ICD-10-CM | POA: Diagnosis not present

## 2018-08-06 DIAGNOSIS — M6281 Muscle weakness (generalized): Secondary | ICD-10-CM | POA: Diagnosis not present

## 2018-08-06 DIAGNOSIS — R2689 Other abnormalities of gait and mobility: Secondary | ICD-10-CM | POA: Diagnosis not present

## 2018-08-06 DIAGNOSIS — M545 Low back pain: Secondary | ICD-10-CM | POA: Diagnosis not present

## 2018-08-12 DIAGNOSIS — M6281 Muscle weakness (generalized): Secondary | ICD-10-CM | POA: Diagnosis not present

## 2018-08-12 DIAGNOSIS — R2689 Other abnormalities of gait and mobility: Secondary | ICD-10-CM | POA: Diagnosis not present

## 2018-08-12 DIAGNOSIS — M545 Low back pain: Secondary | ICD-10-CM | POA: Diagnosis not present

## 2018-08-27 DIAGNOSIS — E349 Endocrine disorder, unspecified: Secondary | ICD-10-CM | POA: Diagnosis not present

## 2018-08-27 DIAGNOSIS — R3915 Urgency of urination: Secondary | ICD-10-CM | POA: Diagnosis not present

## 2018-08-31 DIAGNOSIS — G894 Chronic pain syndrome: Secondary | ICD-10-CM | POA: Diagnosis not present

## 2018-09-29 DIAGNOSIS — G894 Chronic pain syndrome: Secondary | ICD-10-CM | POA: Diagnosis not present

## 2018-09-29 DIAGNOSIS — M961 Postlaminectomy syndrome, not elsewhere classified: Secondary | ICD-10-CM | POA: Diagnosis not present

## 2018-09-29 DIAGNOSIS — M533 Sacrococcygeal disorders, not elsewhere classified: Secondary | ICD-10-CM | POA: Diagnosis not present

## 2018-09-29 DIAGNOSIS — M47816 Spondylosis without myelopathy or radiculopathy, lumbar region: Secondary | ICD-10-CM | POA: Diagnosis not present

## 2018-09-29 DIAGNOSIS — M25552 Pain in left hip: Secondary | ICD-10-CM | POA: Diagnosis not present

## 2018-10-21 DIAGNOSIS — I129 Hypertensive chronic kidney disease with stage 1 through stage 4 chronic kidney disease, or unspecified chronic kidney disease: Secondary | ICD-10-CM | POA: Diagnosis not present

## 2018-10-21 DIAGNOSIS — Z79899 Other long term (current) drug therapy: Secondary | ICD-10-CM | POA: Diagnosis not present

## 2018-10-21 DIAGNOSIS — E785 Hyperlipidemia, unspecified: Secondary | ICD-10-CM | POA: Diagnosis not present

## 2018-10-21 DIAGNOSIS — M109 Gout, unspecified: Secondary | ICD-10-CM | POA: Diagnosis not present

## 2018-10-21 DIAGNOSIS — G894 Chronic pain syndrome: Secondary | ICD-10-CM | POA: Diagnosis not present

## 2018-11-02 DIAGNOSIS — E785 Hyperlipidemia, unspecified: Secondary | ICD-10-CM | POA: Diagnosis not present

## 2018-11-02 DIAGNOSIS — Z9181 History of falling: Secondary | ICD-10-CM | POA: Diagnosis not present

## 2018-11-02 DIAGNOSIS — Z0000 Encounter for general adult medical examination without abnormal findings: Secondary | ICD-10-CM | POA: Diagnosis not present

## 2018-12-22 DIAGNOSIS — M961 Postlaminectomy syndrome, not elsewhere classified: Secondary | ICD-10-CM | POA: Diagnosis not present

## 2019-01-19 DIAGNOSIS — G894 Chronic pain syndrome: Secondary | ICD-10-CM | POA: Diagnosis not present

## 2019-01-19 DIAGNOSIS — M961 Postlaminectomy syndrome, not elsewhere classified: Secondary | ICD-10-CM | POA: Diagnosis not present

## 2019-01-26 DIAGNOSIS — M961 Postlaminectomy syndrome, not elsewhere classified: Secondary | ICD-10-CM | POA: Diagnosis not present

## 2019-01-26 DIAGNOSIS — I10 Essential (primary) hypertension: Secondary | ICD-10-CM | POA: Diagnosis not present

## 2019-01-26 DIAGNOSIS — G894 Chronic pain syndrome: Secondary | ICD-10-CM | POA: Diagnosis not present

## 2019-01-28 DIAGNOSIS — I129 Hypertensive chronic kidney disease with stage 1 through stage 4 chronic kidney disease, or unspecified chronic kidney disease: Secondary | ICD-10-CM | POA: Diagnosis not present

## 2019-01-28 DIAGNOSIS — E785 Hyperlipidemia, unspecified: Secondary | ICD-10-CM | POA: Diagnosis not present

## 2019-01-28 DIAGNOSIS — Z79899 Other long term (current) drug therapy: Secondary | ICD-10-CM | POA: Diagnosis not present

## 2019-01-28 DIAGNOSIS — M109 Gout, unspecified: Secondary | ICD-10-CM | POA: Diagnosis not present

## 2019-01-28 DIAGNOSIS — N183 Chronic kidney disease, stage 3 (moderate): Secondary | ICD-10-CM | POA: Diagnosis not present

## 2019-02-12 DIAGNOSIS — M961 Postlaminectomy syndrome, not elsewhere classified: Secondary | ICD-10-CM | POA: Diagnosis not present

## 2019-02-12 DIAGNOSIS — G894 Chronic pain syndrome: Secondary | ICD-10-CM | POA: Diagnosis not present

## 2019-02-24 DIAGNOSIS — M961 Postlaminectomy syndrome, not elsewhere classified: Secondary | ICD-10-CM | POA: Diagnosis not present

## 2019-02-24 DIAGNOSIS — I10 Essential (primary) hypertension: Secondary | ICD-10-CM | POA: Diagnosis not present

## 2019-03-05 DIAGNOSIS — M720 Palmar fascial fibromatosis [Dupuytren]: Secondary | ICD-10-CM | POA: Diagnosis not present

## 2019-03-05 DIAGNOSIS — Z23 Encounter for immunization: Secondary | ICD-10-CM | POA: Diagnosis not present

## 2019-03-17 DIAGNOSIS — M720 Palmar fascial fibromatosis [Dupuytren]: Secondary | ICD-10-CM | POA: Insufficient documentation

## 2019-03-24 DIAGNOSIS — I10 Essential (primary) hypertension: Secondary | ICD-10-CM | POA: Diagnosis not present

## 2019-03-24 DIAGNOSIS — M961 Postlaminectomy syndrome, not elsewhere classified: Secondary | ICD-10-CM | POA: Diagnosis not present

## 2019-03-24 DIAGNOSIS — G894 Chronic pain syndrome: Secondary | ICD-10-CM | POA: Diagnosis not present

## 2019-03-24 DIAGNOSIS — Z9689 Presence of other specified functional implants: Secondary | ICD-10-CM | POA: Diagnosis not present

## 2019-03-25 DIAGNOSIS — R2689 Other abnormalities of gait and mobility: Secondary | ICD-10-CM | POA: Diagnosis not present

## 2019-03-25 DIAGNOSIS — M545 Low back pain: Secondary | ICD-10-CM | POA: Diagnosis not present

## 2019-03-25 DIAGNOSIS — M6281 Muscle weakness (generalized): Secondary | ICD-10-CM | POA: Diagnosis not present

## 2019-03-30 DIAGNOSIS — R2689 Other abnormalities of gait and mobility: Secondary | ICD-10-CM | POA: Diagnosis not present

## 2019-03-30 DIAGNOSIS — M545 Low back pain: Secondary | ICD-10-CM | POA: Diagnosis not present

## 2019-03-30 DIAGNOSIS — M6281 Muscle weakness (generalized): Secondary | ICD-10-CM | POA: Diagnosis not present

## 2019-04-01 DIAGNOSIS — M6281 Muscle weakness (generalized): Secondary | ICD-10-CM | POA: Diagnosis not present

## 2019-04-01 DIAGNOSIS — R2689 Other abnormalities of gait and mobility: Secondary | ICD-10-CM | POA: Diagnosis not present

## 2019-04-01 DIAGNOSIS — M545 Low back pain: Secondary | ICD-10-CM | POA: Diagnosis not present

## 2019-04-06 DIAGNOSIS — M545 Low back pain: Secondary | ICD-10-CM | POA: Diagnosis not present

## 2019-04-06 DIAGNOSIS — M6281 Muscle weakness (generalized): Secondary | ICD-10-CM | POA: Diagnosis not present

## 2019-04-06 DIAGNOSIS — R2689 Other abnormalities of gait and mobility: Secondary | ICD-10-CM | POA: Diagnosis not present

## 2019-04-08 DIAGNOSIS — R2689 Other abnormalities of gait and mobility: Secondary | ICD-10-CM | POA: Diagnosis not present

## 2019-04-08 DIAGNOSIS — M545 Low back pain: Secondary | ICD-10-CM | POA: Diagnosis not present

## 2019-04-08 DIAGNOSIS — M6281 Muscle weakness (generalized): Secondary | ICD-10-CM | POA: Diagnosis not present

## 2019-04-13 DIAGNOSIS — M545 Low back pain: Secondary | ICD-10-CM | POA: Diagnosis not present

## 2019-04-13 DIAGNOSIS — R2689 Other abnormalities of gait and mobility: Secondary | ICD-10-CM | POA: Diagnosis not present

## 2019-04-13 DIAGNOSIS — M6281 Muscle weakness (generalized): Secondary | ICD-10-CM | POA: Diagnosis not present

## 2019-04-15 DIAGNOSIS — R2689 Other abnormalities of gait and mobility: Secondary | ICD-10-CM | POA: Diagnosis not present

## 2019-04-15 DIAGNOSIS — M6281 Muscle weakness (generalized): Secondary | ICD-10-CM | POA: Diagnosis not present

## 2019-04-15 DIAGNOSIS — M545 Low back pain: Secondary | ICD-10-CM | POA: Diagnosis not present

## 2019-04-20 DIAGNOSIS — R2689 Other abnormalities of gait and mobility: Secondary | ICD-10-CM | POA: Diagnosis not present

## 2019-04-20 DIAGNOSIS — M545 Low back pain: Secondary | ICD-10-CM | POA: Diagnosis not present

## 2019-04-20 DIAGNOSIS — M6281 Muscle weakness (generalized): Secondary | ICD-10-CM | POA: Diagnosis not present

## 2019-04-27 DIAGNOSIS — R2689 Other abnormalities of gait and mobility: Secondary | ICD-10-CM | POA: Diagnosis not present

## 2019-04-27 DIAGNOSIS — M109 Gout, unspecified: Secondary | ICD-10-CM | POA: Diagnosis not present

## 2019-04-27 DIAGNOSIS — N183 Chronic kidney disease, stage 3 unspecified: Secondary | ICD-10-CM | POA: Diagnosis not present

## 2019-04-27 DIAGNOSIS — M6281 Muscle weakness (generalized): Secondary | ICD-10-CM | POA: Diagnosis not present

## 2019-04-27 DIAGNOSIS — I129 Hypertensive chronic kidney disease with stage 1 through stage 4 chronic kidney disease, or unspecified chronic kidney disease: Secondary | ICD-10-CM | POA: Diagnosis not present

## 2019-04-27 DIAGNOSIS — E785 Hyperlipidemia, unspecified: Secondary | ICD-10-CM | POA: Diagnosis not present

## 2019-04-27 DIAGNOSIS — M25551 Pain in right hip: Secondary | ICD-10-CM | POA: Diagnosis not present

## 2019-04-27 DIAGNOSIS — M545 Low back pain: Secondary | ICD-10-CM | POA: Diagnosis not present

## 2019-04-29 DIAGNOSIS — M6281 Muscle weakness (generalized): Secondary | ICD-10-CM | POA: Diagnosis not present

## 2019-04-29 DIAGNOSIS — M545 Low back pain: Secondary | ICD-10-CM | POA: Diagnosis not present

## 2019-04-29 DIAGNOSIS — R2689 Other abnormalities of gait and mobility: Secondary | ICD-10-CM | POA: Diagnosis not present

## 2019-05-04 DIAGNOSIS — M545 Low back pain: Secondary | ICD-10-CM | POA: Diagnosis not present

## 2019-05-04 DIAGNOSIS — M6281 Muscle weakness (generalized): Secondary | ICD-10-CM | POA: Diagnosis not present

## 2019-05-04 DIAGNOSIS — R2689 Other abnormalities of gait and mobility: Secondary | ICD-10-CM | POA: Diagnosis not present

## 2019-05-05 DIAGNOSIS — M5136 Other intervertebral disc degeneration, lumbar region: Secondary | ICD-10-CM | POA: Diagnosis not present

## 2019-05-06 DIAGNOSIS — R2689 Other abnormalities of gait and mobility: Secondary | ICD-10-CM | POA: Diagnosis not present

## 2019-05-06 DIAGNOSIS — M6281 Muscle weakness (generalized): Secondary | ICD-10-CM | POA: Diagnosis not present

## 2019-05-06 DIAGNOSIS — M545 Low back pain: Secondary | ICD-10-CM | POA: Diagnosis not present

## 2019-05-11 DIAGNOSIS — M6281 Muscle weakness (generalized): Secondary | ICD-10-CM | POA: Diagnosis not present

## 2019-05-11 DIAGNOSIS — M545 Low back pain: Secondary | ICD-10-CM | POA: Diagnosis not present

## 2019-05-11 DIAGNOSIS — R2689 Other abnormalities of gait and mobility: Secondary | ICD-10-CM | POA: Diagnosis not present

## 2019-05-13 DIAGNOSIS — M545 Low back pain: Secondary | ICD-10-CM | POA: Diagnosis not present

## 2019-05-13 DIAGNOSIS — M6281 Muscle weakness (generalized): Secondary | ICD-10-CM | POA: Diagnosis not present

## 2019-05-13 DIAGNOSIS — R2689 Other abnormalities of gait and mobility: Secondary | ICD-10-CM | POA: Diagnosis not present

## 2019-05-21 DIAGNOSIS — M961 Postlaminectomy syndrome, not elsewhere classified: Secondary | ICD-10-CM | POA: Diagnosis not present

## 2019-05-21 DIAGNOSIS — M25551 Pain in right hip: Secondary | ICD-10-CM | POA: Diagnosis not present

## 2019-05-21 DIAGNOSIS — G8929 Other chronic pain: Secondary | ICD-10-CM | POA: Diagnosis not present

## 2019-05-21 DIAGNOSIS — M5431 Sciatica, right side: Secondary | ICD-10-CM | POA: Diagnosis not present

## 2019-05-25 DIAGNOSIS — M6281 Muscle weakness (generalized): Secondary | ICD-10-CM | POA: Diagnosis not present

## 2019-05-25 DIAGNOSIS — R2689 Other abnormalities of gait and mobility: Secondary | ICD-10-CM | POA: Diagnosis not present

## 2019-05-25 DIAGNOSIS — M545 Low back pain: Secondary | ICD-10-CM | POA: Diagnosis not present

## 2019-05-26 DIAGNOSIS — Z9689 Presence of other specified functional implants: Secondary | ICD-10-CM | POA: Diagnosis not present

## 2019-05-26 DIAGNOSIS — G894 Chronic pain syndrome: Secondary | ICD-10-CM | POA: Diagnosis not present

## 2019-05-27 DIAGNOSIS — M6281 Muscle weakness (generalized): Secondary | ICD-10-CM | POA: Diagnosis not present

## 2019-05-27 DIAGNOSIS — M545 Low back pain: Secondary | ICD-10-CM | POA: Diagnosis not present

## 2019-05-27 DIAGNOSIS — R2689 Other abnormalities of gait and mobility: Secondary | ICD-10-CM | POA: Diagnosis not present

## 2019-06-01 DIAGNOSIS — R2689 Other abnormalities of gait and mobility: Secondary | ICD-10-CM | POA: Diagnosis not present

## 2019-06-01 DIAGNOSIS — M6281 Muscle weakness (generalized): Secondary | ICD-10-CM | POA: Diagnosis not present

## 2019-06-01 DIAGNOSIS — M545 Low back pain: Secondary | ICD-10-CM | POA: Diagnosis not present

## 2019-06-08 DIAGNOSIS — R2689 Other abnormalities of gait and mobility: Secondary | ICD-10-CM | POA: Diagnosis not present

## 2019-06-08 DIAGNOSIS — M545 Low back pain: Secondary | ICD-10-CM | POA: Diagnosis not present

## 2019-06-08 DIAGNOSIS — M6281 Muscle weakness (generalized): Secondary | ICD-10-CM | POA: Diagnosis not present

## 2019-06-11 DIAGNOSIS — R2689 Other abnormalities of gait and mobility: Secondary | ICD-10-CM | POA: Diagnosis not present

## 2019-06-11 DIAGNOSIS — M545 Low back pain: Secondary | ICD-10-CM | POA: Diagnosis not present

## 2019-06-11 DIAGNOSIS — M6281 Muscle weakness (generalized): Secondary | ICD-10-CM | POA: Diagnosis not present

## 2019-06-16 DIAGNOSIS — M961 Postlaminectomy syndrome, not elsewhere classified: Secondary | ICD-10-CM | POA: Diagnosis not present

## 2019-06-16 DIAGNOSIS — G20 Parkinson's disease: Secondary | ICD-10-CM | POA: Diagnosis not present

## 2019-06-16 DIAGNOSIS — M47816 Spondylosis without myelopathy or radiculopathy, lumbar region: Secondary | ICD-10-CM | POA: Diagnosis not present

## 2019-06-16 DIAGNOSIS — M5416 Radiculopathy, lumbar region: Secondary | ICD-10-CM | POA: Diagnosis not present

## 2019-06-16 DIAGNOSIS — G894 Chronic pain syndrome: Secondary | ICD-10-CM | POA: Diagnosis not present

## 2019-06-30 DIAGNOSIS — M5416 Radiculopathy, lumbar region: Secondary | ICD-10-CM | POA: Diagnosis not present

## 2019-06-30 DIAGNOSIS — M48061 Spinal stenosis, lumbar region without neurogenic claudication: Secondary | ICD-10-CM | POA: Diagnosis not present

## 2019-06-30 DIAGNOSIS — M545 Low back pain: Secondary | ICD-10-CM | POA: Diagnosis not present

## 2019-07-05 DIAGNOSIS — M545 Low back pain: Secondary | ICD-10-CM | POA: Diagnosis not present

## 2019-07-05 DIAGNOSIS — M6281 Muscle weakness (generalized): Secondary | ICD-10-CM | POA: Diagnosis not present

## 2019-07-05 DIAGNOSIS — R2689 Other abnormalities of gait and mobility: Secondary | ICD-10-CM | POA: Diagnosis not present

## 2019-07-06 DIAGNOSIS — G894 Chronic pain syndrome: Secondary | ICD-10-CM | POA: Diagnosis not present

## 2019-07-06 DIAGNOSIS — M47816 Spondylosis without myelopathy or radiculopathy, lumbar region: Secondary | ICD-10-CM | POA: Diagnosis not present

## 2019-07-06 DIAGNOSIS — M961 Postlaminectomy syndrome, not elsewhere classified: Secondary | ICD-10-CM | POA: Diagnosis not present

## 2019-07-06 DIAGNOSIS — M5416 Radiculopathy, lumbar region: Secondary | ICD-10-CM | POA: Diagnosis not present

## 2019-07-07 DIAGNOSIS — M6281 Muscle weakness (generalized): Secondary | ICD-10-CM | POA: Diagnosis not present

## 2019-07-07 DIAGNOSIS — R2689 Other abnormalities of gait and mobility: Secondary | ICD-10-CM | POA: Diagnosis not present

## 2019-07-07 DIAGNOSIS — M545 Low back pain: Secondary | ICD-10-CM | POA: Diagnosis not present

## 2019-07-13 DIAGNOSIS — M545 Low back pain: Secondary | ICD-10-CM | POA: Diagnosis not present

## 2019-07-13 DIAGNOSIS — R2689 Other abnormalities of gait and mobility: Secondary | ICD-10-CM | POA: Diagnosis not present

## 2019-07-13 DIAGNOSIS — M6281 Muscle weakness (generalized): Secondary | ICD-10-CM | POA: Diagnosis not present

## 2019-07-27 DIAGNOSIS — Z981 Arthrodesis status: Secondary | ICD-10-CM | POA: Diagnosis not present

## 2019-07-27 DIAGNOSIS — M5416 Radiculopathy, lumbar region: Secondary | ICD-10-CM | POA: Diagnosis not present

## 2019-07-27 DIAGNOSIS — M48061 Spinal stenosis, lumbar region without neurogenic claudication: Secondary | ICD-10-CM | POA: Diagnosis not present

## 2019-07-27 DIAGNOSIS — M5386 Other specified dorsopathies, lumbar region: Secondary | ICD-10-CM | POA: Diagnosis not present

## 2019-07-28 DIAGNOSIS — M109 Gout, unspecified: Secondary | ICD-10-CM | POA: Diagnosis not present

## 2019-07-28 DIAGNOSIS — N183 Chronic kidney disease, stage 3 unspecified: Secondary | ICD-10-CM | POA: Diagnosis not present

## 2019-07-28 DIAGNOSIS — I129 Hypertensive chronic kidney disease with stage 1 through stage 4 chronic kidney disease, or unspecified chronic kidney disease: Secondary | ICD-10-CM | POA: Diagnosis not present

## 2019-07-28 DIAGNOSIS — E785 Hyperlipidemia, unspecified: Secondary | ICD-10-CM | POA: Diagnosis not present

## 2019-07-28 DIAGNOSIS — G894 Chronic pain syndrome: Secondary | ICD-10-CM | POA: Diagnosis not present

## 2019-08-06 DIAGNOSIS — M47816 Spondylosis without myelopathy or radiculopathy, lumbar region: Secondary | ICD-10-CM | POA: Diagnosis not present

## 2019-08-06 DIAGNOSIS — G894 Chronic pain syndrome: Secondary | ICD-10-CM | POA: Diagnosis not present

## 2019-08-06 DIAGNOSIS — M5416 Radiculopathy, lumbar region: Secondary | ICD-10-CM | POA: Diagnosis not present

## 2019-08-06 DIAGNOSIS — M25551 Pain in right hip: Secondary | ICD-10-CM | POA: Diagnosis not present

## 2019-08-16 DIAGNOSIS — M48062 Spinal stenosis, lumbar region with neurogenic claudication: Secondary | ICD-10-CM | POA: Diagnosis not present

## 2019-08-16 DIAGNOSIS — E785 Hyperlipidemia, unspecified: Secondary | ICD-10-CM | POA: Diagnosis not present

## 2019-08-16 DIAGNOSIS — M533 Sacrococcygeal disorders, not elsewhere classified: Secondary | ICD-10-CM | POA: Diagnosis not present

## 2019-08-16 DIAGNOSIS — I129 Hypertensive chronic kidney disease with stage 1 through stage 4 chronic kidney disease, or unspecified chronic kidney disease: Secondary | ICD-10-CM | POA: Diagnosis not present

## 2019-08-16 DIAGNOSIS — G894 Chronic pain syndrome: Secondary | ICD-10-CM | POA: Diagnosis not present

## 2019-08-16 DIAGNOSIS — M25529 Pain in unspecified elbow: Secondary | ICD-10-CM | POA: Diagnosis not present

## 2019-08-16 DIAGNOSIS — M109 Gout, unspecified: Secondary | ICD-10-CM | POA: Diagnosis not present

## 2019-08-16 DIAGNOSIS — N183 Chronic kidney disease, stage 3 unspecified: Secondary | ICD-10-CM | POA: Diagnosis not present

## 2019-08-16 DIAGNOSIS — M25551 Pain in right hip: Secondary | ICD-10-CM | POA: Diagnosis not present

## 2019-08-16 DIAGNOSIS — M961 Postlaminectomy syndrome, not elsewhere classified: Secondary | ICD-10-CM | POA: Diagnosis not present

## 2019-08-20 DIAGNOSIS — M25529 Pain in unspecified elbow: Secondary | ICD-10-CM | POA: Diagnosis not present

## 2019-08-20 DIAGNOSIS — M961 Postlaminectomy syndrome, not elsewhere classified: Secondary | ICD-10-CM | POA: Diagnosis not present

## 2019-08-20 DIAGNOSIS — M25551 Pain in right hip: Secondary | ICD-10-CM | POA: Diagnosis not present

## 2019-08-20 DIAGNOSIS — M48062 Spinal stenosis, lumbar region with neurogenic claudication: Secondary | ICD-10-CM | POA: Diagnosis not present

## 2019-08-20 DIAGNOSIS — M109 Gout, unspecified: Secondary | ICD-10-CM | POA: Diagnosis not present

## 2019-08-20 DIAGNOSIS — G894 Chronic pain syndrome: Secondary | ICD-10-CM | POA: Diagnosis not present

## 2019-08-20 DIAGNOSIS — I129 Hypertensive chronic kidney disease with stage 1 through stage 4 chronic kidney disease, or unspecified chronic kidney disease: Secondary | ICD-10-CM | POA: Diagnosis not present

## 2019-08-20 DIAGNOSIS — E785 Hyperlipidemia, unspecified: Secondary | ICD-10-CM | POA: Diagnosis not present

## 2019-08-20 DIAGNOSIS — M533 Sacrococcygeal disorders, not elsewhere classified: Secondary | ICD-10-CM | POA: Diagnosis not present

## 2019-08-20 DIAGNOSIS — N183 Chronic kidney disease, stage 3 unspecified: Secondary | ICD-10-CM | POA: Diagnosis not present

## 2019-08-24 DIAGNOSIS — M25551 Pain in right hip: Secondary | ICD-10-CM | POA: Diagnosis not present

## 2019-08-24 DIAGNOSIS — E785 Hyperlipidemia, unspecified: Secondary | ICD-10-CM | POA: Diagnosis not present

## 2019-08-24 DIAGNOSIS — G894 Chronic pain syndrome: Secondary | ICD-10-CM | POA: Diagnosis not present

## 2019-08-24 DIAGNOSIS — M961 Postlaminectomy syndrome, not elsewhere classified: Secondary | ICD-10-CM | POA: Diagnosis not present

## 2019-08-24 DIAGNOSIS — M533 Sacrococcygeal disorders, not elsewhere classified: Secondary | ICD-10-CM | POA: Diagnosis not present

## 2019-08-24 DIAGNOSIS — M109 Gout, unspecified: Secondary | ICD-10-CM | POA: Diagnosis not present

## 2019-08-24 DIAGNOSIS — N183 Chronic kidney disease, stage 3 unspecified: Secondary | ICD-10-CM | POA: Diagnosis not present

## 2019-08-24 DIAGNOSIS — M25529 Pain in unspecified elbow: Secondary | ICD-10-CM | POA: Diagnosis not present

## 2019-08-24 DIAGNOSIS — M48062 Spinal stenosis, lumbar region with neurogenic claudication: Secondary | ICD-10-CM | POA: Diagnosis not present

## 2019-08-24 DIAGNOSIS — I129 Hypertensive chronic kidney disease with stage 1 through stage 4 chronic kidney disease, or unspecified chronic kidney disease: Secondary | ICD-10-CM | POA: Diagnosis not present

## 2019-08-26 DIAGNOSIS — M961 Postlaminectomy syndrome, not elsewhere classified: Secondary | ICD-10-CM | POA: Diagnosis not present

## 2019-08-26 DIAGNOSIS — M533 Sacrococcygeal disorders, not elsewhere classified: Secondary | ICD-10-CM | POA: Diagnosis not present

## 2019-08-26 DIAGNOSIS — M48062 Spinal stenosis, lumbar region with neurogenic claudication: Secondary | ICD-10-CM | POA: Diagnosis not present

## 2019-08-26 DIAGNOSIS — M25551 Pain in right hip: Secondary | ICD-10-CM | POA: Diagnosis not present

## 2019-08-26 DIAGNOSIS — E785 Hyperlipidemia, unspecified: Secondary | ICD-10-CM | POA: Diagnosis not present

## 2019-08-26 DIAGNOSIS — G894 Chronic pain syndrome: Secondary | ICD-10-CM | POA: Diagnosis not present

## 2019-08-26 DIAGNOSIS — I129 Hypertensive chronic kidney disease with stage 1 through stage 4 chronic kidney disease, or unspecified chronic kidney disease: Secondary | ICD-10-CM | POA: Diagnosis not present

## 2019-08-26 DIAGNOSIS — N183 Chronic kidney disease, stage 3 unspecified: Secondary | ICD-10-CM | POA: Diagnosis not present

## 2019-08-26 DIAGNOSIS — M25529 Pain in unspecified elbow: Secondary | ICD-10-CM | POA: Diagnosis not present

## 2019-08-26 DIAGNOSIS — M109 Gout, unspecified: Secondary | ICD-10-CM | POA: Diagnosis not present

## 2019-08-30 DIAGNOSIS — R202 Paresthesia of skin: Secondary | ICD-10-CM | POA: Diagnosis not present

## 2019-08-31 DIAGNOSIS — M109 Gout, unspecified: Secondary | ICD-10-CM | POA: Diagnosis not present

## 2019-08-31 DIAGNOSIS — E785 Hyperlipidemia, unspecified: Secondary | ICD-10-CM | POA: Diagnosis not present

## 2019-08-31 DIAGNOSIS — N183 Chronic kidney disease, stage 3 unspecified: Secondary | ICD-10-CM | POA: Diagnosis not present

## 2019-08-31 DIAGNOSIS — M25529 Pain in unspecified elbow: Secondary | ICD-10-CM | POA: Diagnosis not present

## 2019-08-31 DIAGNOSIS — M533 Sacrococcygeal disorders, not elsewhere classified: Secondary | ICD-10-CM | POA: Diagnosis not present

## 2019-08-31 DIAGNOSIS — M48062 Spinal stenosis, lumbar region with neurogenic claudication: Secondary | ICD-10-CM | POA: Diagnosis not present

## 2019-08-31 DIAGNOSIS — I129 Hypertensive chronic kidney disease with stage 1 through stage 4 chronic kidney disease, or unspecified chronic kidney disease: Secondary | ICD-10-CM | POA: Diagnosis not present

## 2019-08-31 DIAGNOSIS — M25551 Pain in right hip: Secondary | ICD-10-CM | POA: Diagnosis not present

## 2019-08-31 DIAGNOSIS — G894 Chronic pain syndrome: Secondary | ICD-10-CM | POA: Diagnosis not present

## 2019-08-31 DIAGNOSIS — M961 Postlaminectomy syndrome, not elsewhere classified: Secondary | ICD-10-CM | POA: Diagnosis not present

## 2019-09-02 DIAGNOSIS — M48062 Spinal stenosis, lumbar region with neurogenic claudication: Secondary | ICD-10-CM | POA: Diagnosis not present

## 2019-09-02 DIAGNOSIS — M109 Gout, unspecified: Secondary | ICD-10-CM | POA: Diagnosis not present

## 2019-09-02 DIAGNOSIS — M533 Sacrococcygeal disorders, not elsewhere classified: Secondary | ICD-10-CM | POA: Diagnosis not present

## 2019-09-02 DIAGNOSIS — M25551 Pain in right hip: Secondary | ICD-10-CM | POA: Diagnosis not present

## 2019-09-02 DIAGNOSIS — M961 Postlaminectomy syndrome, not elsewhere classified: Secondary | ICD-10-CM | POA: Diagnosis not present

## 2019-09-02 DIAGNOSIS — N183 Chronic kidney disease, stage 3 unspecified: Secondary | ICD-10-CM | POA: Diagnosis not present

## 2019-09-02 DIAGNOSIS — G894 Chronic pain syndrome: Secondary | ICD-10-CM | POA: Diagnosis not present

## 2019-09-02 DIAGNOSIS — E785 Hyperlipidemia, unspecified: Secondary | ICD-10-CM | POA: Diagnosis not present

## 2019-09-02 DIAGNOSIS — M25529 Pain in unspecified elbow: Secondary | ICD-10-CM | POA: Diagnosis not present

## 2019-09-02 DIAGNOSIS — I129 Hypertensive chronic kidney disease with stage 1 through stage 4 chronic kidney disease, or unspecified chronic kidney disease: Secondary | ICD-10-CM | POA: Diagnosis not present

## 2019-09-06 DIAGNOSIS — R262 Difficulty in walking, not elsewhere classified: Secondary | ICD-10-CM | POA: Diagnosis not present

## 2019-09-06 DIAGNOSIS — R531 Weakness: Secondary | ICD-10-CM | POA: Diagnosis not present

## 2019-09-07 DIAGNOSIS — E785 Hyperlipidemia, unspecified: Secondary | ICD-10-CM | POA: Diagnosis not present

## 2019-09-07 DIAGNOSIS — M25551 Pain in right hip: Secondary | ICD-10-CM | POA: Diagnosis not present

## 2019-09-07 DIAGNOSIS — M533 Sacrococcygeal disorders, not elsewhere classified: Secondary | ICD-10-CM | POA: Diagnosis not present

## 2019-09-07 DIAGNOSIS — M48062 Spinal stenosis, lumbar region with neurogenic claudication: Secondary | ICD-10-CM | POA: Diagnosis not present

## 2019-09-07 DIAGNOSIS — M25529 Pain in unspecified elbow: Secondary | ICD-10-CM | POA: Diagnosis not present

## 2019-09-07 DIAGNOSIS — I129 Hypertensive chronic kidney disease with stage 1 through stage 4 chronic kidney disease, or unspecified chronic kidney disease: Secondary | ICD-10-CM | POA: Diagnosis not present

## 2019-09-07 DIAGNOSIS — N183 Chronic kidney disease, stage 3 unspecified: Secondary | ICD-10-CM | POA: Diagnosis not present

## 2019-09-07 DIAGNOSIS — M961 Postlaminectomy syndrome, not elsewhere classified: Secondary | ICD-10-CM | POA: Diagnosis not present

## 2019-09-07 DIAGNOSIS — M109 Gout, unspecified: Secondary | ICD-10-CM | POA: Diagnosis not present

## 2019-09-07 DIAGNOSIS — G894 Chronic pain syndrome: Secondary | ICD-10-CM | POA: Diagnosis not present

## 2019-09-09 DIAGNOSIS — M48062 Spinal stenosis, lumbar region with neurogenic claudication: Secondary | ICD-10-CM | POA: Diagnosis not present

## 2019-09-09 DIAGNOSIS — M25551 Pain in right hip: Secondary | ICD-10-CM | POA: Diagnosis not present

## 2019-09-09 DIAGNOSIS — M25529 Pain in unspecified elbow: Secondary | ICD-10-CM | POA: Diagnosis not present

## 2019-09-09 DIAGNOSIS — M533 Sacrococcygeal disorders, not elsewhere classified: Secondary | ICD-10-CM | POA: Diagnosis not present

## 2019-09-09 DIAGNOSIS — I129 Hypertensive chronic kidney disease with stage 1 through stage 4 chronic kidney disease, or unspecified chronic kidney disease: Secondary | ICD-10-CM | POA: Diagnosis not present

## 2019-09-09 DIAGNOSIS — N183 Chronic kidney disease, stage 3 unspecified: Secondary | ICD-10-CM | POA: Diagnosis not present

## 2019-09-09 DIAGNOSIS — M961 Postlaminectomy syndrome, not elsewhere classified: Secondary | ICD-10-CM | POA: Diagnosis not present

## 2019-09-09 DIAGNOSIS — M109 Gout, unspecified: Secondary | ICD-10-CM | POA: Diagnosis not present

## 2019-09-09 DIAGNOSIS — G894 Chronic pain syndrome: Secondary | ICD-10-CM | POA: Diagnosis not present

## 2019-09-09 DIAGNOSIS — E785 Hyperlipidemia, unspecified: Secondary | ICD-10-CM | POA: Diagnosis not present

## 2019-09-15 DIAGNOSIS — M25529 Pain in unspecified elbow: Secondary | ICD-10-CM | POA: Diagnosis not present

## 2019-09-15 DIAGNOSIS — M25551 Pain in right hip: Secondary | ICD-10-CM | POA: Diagnosis not present

## 2019-09-15 DIAGNOSIS — N183 Chronic kidney disease, stage 3 unspecified: Secondary | ICD-10-CM | POA: Diagnosis not present

## 2019-09-15 DIAGNOSIS — G894 Chronic pain syndrome: Secondary | ICD-10-CM | POA: Diagnosis not present

## 2019-09-15 DIAGNOSIS — E785 Hyperlipidemia, unspecified: Secondary | ICD-10-CM | POA: Diagnosis not present

## 2019-09-15 DIAGNOSIS — I129 Hypertensive chronic kidney disease with stage 1 through stage 4 chronic kidney disease, or unspecified chronic kidney disease: Secondary | ICD-10-CM | POA: Diagnosis not present

## 2019-09-15 DIAGNOSIS — M109 Gout, unspecified: Secondary | ICD-10-CM | POA: Diagnosis not present

## 2019-09-15 DIAGNOSIS — M533 Sacrococcygeal disorders, not elsewhere classified: Secondary | ICD-10-CM | POA: Diagnosis not present

## 2019-09-15 DIAGNOSIS — M48062 Spinal stenosis, lumbar region with neurogenic claudication: Secondary | ICD-10-CM | POA: Diagnosis not present

## 2019-09-15 DIAGNOSIS — M961 Postlaminectomy syndrome, not elsewhere classified: Secondary | ICD-10-CM | POA: Diagnosis not present

## 2019-09-21 DIAGNOSIS — G4733 Obstructive sleep apnea (adult) (pediatric): Secondary | ICD-10-CM | POA: Diagnosis not present

## 2019-09-24 DIAGNOSIS — M961 Postlaminectomy syndrome, not elsewhere classified: Secondary | ICD-10-CM | POA: Diagnosis not present

## 2019-09-24 DIAGNOSIS — I129 Hypertensive chronic kidney disease with stage 1 through stage 4 chronic kidney disease, or unspecified chronic kidney disease: Secondary | ICD-10-CM | POA: Diagnosis not present

## 2019-09-24 DIAGNOSIS — M533 Sacrococcygeal disorders, not elsewhere classified: Secondary | ICD-10-CM | POA: Diagnosis not present

## 2019-09-24 DIAGNOSIS — M48062 Spinal stenosis, lumbar region with neurogenic claudication: Secondary | ICD-10-CM | POA: Diagnosis not present

## 2019-09-24 DIAGNOSIS — M109 Gout, unspecified: Secondary | ICD-10-CM | POA: Diagnosis not present

## 2019-09-24 DIAGNOSIS — M25529 Pain in unspecified elbow: Secondary | ICD-10-CM | POA: Diagnosis not present

## 2019-09-24 DIAGNOSIS — M25551 Pain in right hip: Secondary | ICD-10-CM | POA: Diagnosis not present

## 2019-09-24 DIAGNOSIS — N183 Chronic kidney disease, stage 3 unspecified: Secondary | ICD-10-CM | POA: Diagnosis not present

## 2019-09-24 DIAGNOSIS — G894 Chronic pain syndrome: Secondary | ICD-10-CM | POA: Diagnosis not present

## 2019-09-24 DIAGNOSIS — E785 Hyperlipidemia, unspecified: Secondary | ICD-10-CM | POA: Diagnosis not present

## 2019-09-27 DIAGNOSIS — R600 Localized edema: Secondary | ICD-10-CM | POA: Diagnosis not present

## 2019-09-27 DIAGNOSIS — M7989 Other specified soft tissue disorders: Secondary | ICD-10-CM | POA: Diagnosis not present

## 2019-09-30 DIAGNOSIS — M5416 Radiculopathy, lumbar region: Secondary | ICD-10-CM | POA: Diagnosis not present

## 2019-09-30 DIAGNOSIS — M4804 Spinal stenosis, thoracic region: Secondary | ICD-10-CM | POA: Diagnosis not present

## 2019-09-30 DIAGNOSIS — Z981 Arthrodesis status: Secondary | ICD-10-CM | POA: Diagnosis not present

## 2019-09-30 DIAGNOSIS — M545 Low back pain: Secondary | ICD-10-CM | POA: Diagnosis not present

## 2019-09-30 DIAGNOSIS — M4807 Spinal stenosis, lumbosacral region: Secondary | ICD-10-CM | POA: Diagnosis not present

## 2019-09-30 DIAGNOSIS — M48061 Spinal stenosis, lumbar region without neurogenic claudication: Secondary | ICD-10-CM | POA: Diagnosis not present

## 2019-10-05 DIAGNOSIS — G894 Chronic pain syndrome: Secondary | ICD-10-CM | POA: Diagnosis not present

## 2019-10-05 DIAGNOSIS — M533 Sacrococcygeal disorders, not elsewhere classified: Secondary | ICD-10-CM | POA: Diagnosis not present

## 2019-10-05 DIAGNOSIS — M48062 Spinal stenosis, lumbar region with neurogenic claudication: Secondary | ICD-10-CM | POA: Diagnosis not present

## 2019-10-05 DIAGNOSIS — M961 Postlaminectomy syndrome, not elsewhere classified: Secondary | ICD-10-CM | POA: Diagnosis not present

## 2019-10-05 DIAGNOSIS — M25551 Pain in right hip: Secondary | ICD-10-CM | POA: Diagnosis not present

## 2019-10-05 DIAGNOSIS — M109 Gout, unspecified: Secondary | ICD-10-CM | POA: Diagnosis not present

## 2019-10-05 DIAGNOSIS — I129 Hypertensive chronic kidney disease with stage 1 through stage 4 chronic kidney disease, or unspecified chronic kidney disease: Secondary | ICD-10-CM | POA: Diagnosis not present

## 2019-10-05 DIAGNOSIS — E785 Hyperlipidemia, unspecified: Secondary | ICD-10-CM | POA: Diagnosis not present

## 2019-10-05 DIAGNOSIS — M25529 Pain in unspecified elbow: Secondary | ICD-10-CM | POA: Diagnosis not present

## 2019-10-05 DIAGNOSIS — N183 Chronic kidney disease, stage 3 unspecified: Secondary | ICD-10-CM | POA: Diagnosis not present

## 2019-10-07 DIAGNOSIS — M5417 Radiculopathy, lumbosacral region: Secondary | ICD-10-CM | POA: Diagnosis not present

## 2019-10-08 DIAGNOSIS — M5417 Radiculopathy, lumbosacral region: Secondary | ICD-10-CM | POA: Diagnosis not present

## 2019-10-12 DIAGNOSIS — G894 Chronic pain syndrome: Secondary | ICD-10-CM | POA: Diagnosis not present

## 2019-10-12 DIAGNOSIS — E785 Hyperlipidemia, unspecified: Secondary | ICD-10-CM | POA: Diagnosis not present

## 2019-10-12 DIAGNOSIS — M961 Postlaminectomy syndrome, not elsewhere classified: Secondary | ICD-10-CM | POA: Diagnosis not present

## 2019-10-12 DIAGNOSIS — M48062 Spinal stenosis, lumbar region with neurogenic claudication: Secondary | ICD-10-CM | POA: Diagnosis not present

## 2019-10-12 DIAGNOSIS — M25551 Pain in right hip: Secondary | ICD-10-CM | POA: Diagnosis not present

## 2019-10-12 DIAGNOSIS — M109 Gout, unspecified: Secondary | ICD-10-CM | POA: Diagnosis not present

## 2019-10-12 DIAGNOSIS — M25529 Pain in unspecified elbow: Secondary | ICD-10-CM | POA: Diagnosis not present

## 2019-10-12 DIAGNOSIS — N183 Chronic kidney disease, stage 3 unspecified: Secondary | ICD-10-CM | POA: Diagnosis not present

## 2019-10-12 DIAGNOSIS — M533 Sacrococcygeal disorders, not elsewhere classified: Secondary | ICD-10-CM | POA: Diagnosis not present

## 2019-10-12 DIAGNOSIS — I129 Hypertensive chronic kidney disease with stage 1 through stage 4 chronic kidney disease, or unspecified chronic kidney disease: Secondary | ICD-10-CM | POA: Diagnosis not present

## 2019-10-20 DIAGNOSIS — M961 Postlaminectomy syndrome, not elsewhere classified: Secondary | ICD-10-CM | POA: Diagnosis not present

## 2019-10-20 DIAGNOSIS — E785 Hyperlipidemia, unspecified: Secondary | ICD-10-CM | POA: Diagnosis not present

## 2019-10-20 DIAGNOSIS — I129 Hypertensive chronic kidney disease with stage 1 through stage 4 chronic kidney disease, or unspecified chronic kidney disease: Secondary | ICD-10-CM | POA: Diagnosis not present

## 2019-10-20 DIAGNOSIS — M533 Sacrococcygeal disorders, not elsewhere classified: Secondary | ICD-10-CM | POA: Diagnosis not present

## 2019-10-20 DIAGNOSIS — M25529 Pain in unspecified elbow: Secondary | ICD-10-CM | POA: Diagnosis not present

## 2019-10-20 DIAGNOSIS — G894 Chronic pain syndrome: Secondary | ICD-10-CM | POA: Diagnosis not present

## 2019-10-20 DIAGNOSIS — M25551 Pain in right hip: Secondary | ICD-10-CM | POA: Diagnosis not present

## 2019-10-20 DIAGNOSIS — M48062 Spinal stenosis, lumbar region with neurogenic claudication: Secondary | ICD-10-CM | POA: Diagnosis not present

## 2019-10-20 DIAGNOSIS — N183 Chronic kidney disease, stage 3 unspecified: Secondary | ICD-10-CM | POA: Diagnosis not present

## 2019-10-20 DIAGNOSIS — M109 Gout, unspecified: Secondary | ICD-10-CM | POA: Diagnosis not present

## 2019-10-26 DIAGNOSIS — I129 Hypertensive chronic kidney disease with stage 1 through stage 4 chronic kidney disease, or unspecified chronic kidney disease: Secondary | ICD-10-CM | POA: Diagnosis not present

## 2019-10-26 DIAGNOSIS — M109 Gout, unspecified: Secondary | ICD-10-CM | POA: Diagnosis not present

## 2019-10-26 DIAGNOSIS — M25551 Pain in right hip: Secondary | ICD-10-CM | POA: Diagnosis not present

## 2019-10-26 DIAGNOSIS — M48062 Spinal stenosis, lumbar region with neurogenic claudication: Secondary | ICD-10-CM | POA: Diagnosis not present

## 2019-10-26 DIAGNOSIS — E785 Hyperlipidemia, unspecified: Secondary | ICD-10-CM | POA: Diagnosis not present

## 2019-10-26 DIAGNOSIS — M25529 Pain in unspecified elbow: Secondary | ICD-10-CM | POA: Diagnosis not present

## 2019-10-26 DIAGNOSIS — M961 Postlaminectomy syndrome, not elsewhere classified: Secondary | ICD-10-CM | POA: Diagnosis not present

## 2019-10-26 DIAGNOSIS — N183 Chronic kidney disease, stage 3 unspecified: Secondary | ICD-10-CM | POA: Diagnosis not present

## 2019-10-26 DIAGNOSIS — M533 Sacrococcygeal disorders, not elsewhere classified: Secondary | ICD-10-CM | POA: Diagnosis not present

## 2019-10-26 DIAGNOSIS — G894 Chronic pain syndrome: Secondary | ICD-10-CM | POA: Diagnosis not present

## 2019-10-28 DIAGNOSIS — M4805 Spinal stenosis, thoracolumbar region: Secondary | ICD-10-CM | POA: Diagnosis not present

## 2019-10-29 DIAGNOSIS — M25551 Pain in right hip: Secondary | ICD-10-CM | POA: Diagnosis not present

## 2019-10-29 DIAGNOSIS — M961 Postlaminectomy syndrome, not elsewhere classified: Secondary | ICD-10-CM | POA: Diagnosis not present

## 2019-10-29 DIAGNOSIS — I129 Hypertensive chronic kidney disease with stage 1 through stage 4 chronic kidney disease, or unspecified chronic kidney disease: Secondary | ICD-10-CM | POA: Diagnosis not present

## 2019-10-29 DIAGNOSIS — G894 Chronic pain syndrome: Secondary | ICD-10-CM | POA: Diagnosis not present

## 2019-10-29 DIAGNOSIS — N183 Chronic kidney disease, stage 3 unspecified: Secondary | ICD-10-CM | POA: Diagnosis not present

## 2019-10-29 DIAGNOSIS — M25529 Pain in unspecified elbow: Secondary | ICD-10-CM | POA: Diagnosis not present

## 2019-10-29 DIAGNOSIS — M109 Gout, unspecified: Secondary | ICD-10-CM | POA: Diagnosis not present

## 2019-10-29 DIAGNOSIS — E785 Hyperlipidemia, unspecified: Secondary | ICD-10-CM | POA: Diagnosis not present

## 2019-10-29 DIAGNOSIS — M48062 Spinal stenosis, lumbar region with neurogenic claudication: Secondary | ICD-10-CM | POA: Diagnosis not present

## 2019-10-29 DIAGNOSIS — M533 Sacrococcygeal disorders, not elsewhere classified: Secondary | ICD-10-CM | POA: Diagnosis not present

## 2019-11-01 DIAGNOSIS — M1712 Unilateral primary osteoarthritis, left knee: Secondary | ICD-10-CM | POA: Diagnosis not present

## 2019-11-02 DIAGNOSIS — M109 Gout, unspecified: Secondary | ICD-10-CM | POA: Diagnosis not present

## 2019-11-02 DIAGNOSIS — M533 Sacrococcygeal disorders, not elsewhere classified: Secondary | ICD-10-CM | POA: Diagnosis not present

## 2019-11-02 DIAGNOSIS — E785 Hyperlipidemia, unspecified: Secondary | ICD-10-CM | POA: Diagnosis not present

## 2019-11-02 DIAGNOSIS — M961 Postlaminectomy syndrome, not elsewhere classified: Secondary | ICD-10-CM | POA: Diagnosis not present

## 2019-11-02 DIAGNOSIS — I129 Hypertensive chronic kidney disease with stage 1 through stage 4 chronic kidney disease, or unspecified chronic kidney disease: Secondary | ICD-10-CM | POA: Diagnosis not present

## 2019-11-02 DIAGNOSIS — G894 Chronic pain syndrome: Secondary | ICD-10-CM | POA: Diagnosis not present

## 2019-11-02 DIAGNOSIS — M48062 Spinal stenosis, lumbar region with neurogenic claudication: Secondary | ICD-10-CM | POA: Diagnosis not present

## 2019-11-02 DIAGNOSIS — M25551 Pain in right hip: Secondary | ICD-10-CM | POA: Diagnosis not present

## 2019-11-02 DIAGNOSIS — M25529 Pain in unspecified elbow: Secondary | ICD-10-CM | POA: Diagnosis not present

## 2019-11-02 DIAGNOSIS — N183 Chronic kidney disease, stage 3 unspecified: Secondary | ICD-10-CM | POA: Diagnosis not present

## 2019-11-04 DIAGNOSIS — M25529 Pain in unspecified elbow: Secondary | ICD-10-CM | POA: Diagnosis not present

## 2019-11-04 DIAGNOSIS — M48062 Spinal stenosis, lumbar region with neurogenic claudication: Secondary | ICD-10-CM | POA: Diagnosis not present

## 2019-11-04 DIAGNOSIS — G894 Chronic pain syndrome: Secondary | ICD-10-CM | POA: Diagnosis not present

## 2019-11-04 DIAGNOSIS — E785 Hyperlipidemia, unspecified: Secondary | ICD-10-CM | POA: Diagnosis not present

## 2019-11-04 DIAGNOSIS — N183 Chronic kidney disease, stage 3 unspecified: Secondary | ICD-10-CM | POA: Diagnosis not present

## 2019-11-04 DIAGNOSIS — M533 Sacrococcygeal disorders, not elsewhere classified: Secondary | ICD-10-CM | POA: Diagnosis not present

## 2019-11-04 DIAGNOSIS — M961 Postlaminectomy syndrome, not elsewhere classified: Secondary | ICD-10-CM | POA: Diagnosis not present

## 2019-11-04 DIAGNOSIS — M109 Gout, unspecified: Secondary | ICD-10-CM | POA: Diagnosis not present

## 2019-11-04 DIAGNOSIS — I129 Hypertensive chronic kidney disease with stage 1 through stage 4 chronic kidney disease, or unspecified chronic kidney disease: Secondary | ICD-10-CM | POA: Diagnosis not present

## 2019-11-04 DIAGNOSIS — M25551 Pain in right hip: Secondary | ICD-10-CM | POA: Diagnosis not present

## 2019-11-08 DIAGNOSIS — E785 Hyperlipidemia, unspecified: Secondary | ICD-10-CM | POA: Diagnosis not present

## 2019-11-08 DIAGNOSIS — G894 Chronic pain syndrome: Secondary | ICD-10-CM | POA: Diagnosis not present

## 2019-11-08 DIAGNOSIS — M48062 Spinal stenosis, lumbar region with neurogenic claudication: Secondary | ICD-10-CM | POA: Diagnosis not present

## 2019-11-08 DIAGNOSIS — M533 Sacrococcygeal disorders, not elsewhere classified: Secondary | ICD-10-CM | POA: Diagnosis not present

## 2019-11-08 DIAGNOSIS — M25529 Pain in unspecified elbow: Secondary | ICD-10-CM | POA: Diagnosis not present

## 2019-11-08 DIAGNOSIS — N183 Chronic kidney disease, stage 3 unspecified: Secondary | ICD-10-CM | POA: Diagnosis not present

## 2019-11-08 DIAGNOSIS — M109 Gout, unspecified: Secondary | ICD-10-CM | POA: Diagnosis not present

## 2019-11-08 DIAGNOSIS — M25551 Pain in right hip: Secondary | ICD-10-CM | POA: Diagnosis not present

## 2019-11-08 DIAGNOSIS — M961 Postlaminectomy syndrome, not elsewhere classified: Secondary | ICD-10-CM | POA: Diagnosis not present

## 2019-11-08 DIAGNOSIS — I129 Hypertensive chronic kidney disease with stage 1 through stage 4 chronic kidney disease, or unspecified chronic kidney disease: Secondary | ICD-10-CM | POA: Diagnosis not present

## 2019-11-10 DIAGNOSIS — Z9181 History of falling: Secondary | ICD-10-CM | POA: Diagnosis not present

## 2019-11-10 DIAGNOSIS — E785 Hyperlipidemia, unspecified: Secondary | ICD-10-CM | POA: Diagnosis not present

## 2019-11-10 DIAGNOSIS — M109 Gout, unspecified: Secondary | ICD-10-CM | POA: Diagnosis not present

## 2019-11-10 DIAGNOSIS — M25529 Pain in unspecified elbow: Secondary | ICD-10-CM | POA: Diagnosis not present

## 2019-11-10 DIAGNOSIS — M961 Postlaminectomy syndrome, not elsewhere classified: Secondary | ICD-10-CM | POA: Diagnosis not present

## 2019-11-10 DIAGNOSIS — Z1211 Encounter for screening for malignant neoplasm of colon: Secondary | ICD-10-CM | POA: Diagnosis not present

## 2019-11-10 DIAGNOSIS — Z0000 Encounter for general adult medical examination without abnormal findings: Secondary | ICD-10-CM | POA: Diagnosis not present

## 2019-11-10 DIAGNOSIS — N183 Chronic kidney disease, stage 3 unspecified: Secondary | ICD-10-CM | POA: Diagnosis not present

## 2019-11-10 DIAGNOSIS — M48062 Spinal stenosis, lumbar region with neurogenic claudication: Secondary | ICD-10-CM | POA: Diagnosis not present

## 2019-11-10 DIAGNOSIS — M25551 Pain in right hip: Secondary | ICD-10-CM | POA: Diagnosis not present

## 2019-11-10 DIAGNOSIS — I129 Hypertensive chronic kidney disease with stage 1 through stage 4 chronic kidney disease, or unspecified chronic kidney disease: Secondary | ICD-10-CM | POA: Diagnosis not present

## 2019-11-10 DIAGNOSIS — M533 Sacrococcygeal disorders, not elsewhere classified: Secondary | ICD-10-CM | POA: Diagnosis not present

## 2019-11-10 DIAGNOSIS — G894 Chronic pain syndrome: Secondary | ICD-10-CM | POA: Diagnosis not present

## 2019-11-11 DIAGNOSIS — M48061 Spinal stenosis, lumbar region without neurogenic claudication: Secondary | ICD-10-CM | POA: Diagnosis not present

## 2019-11-18 DIAGNOSIS — M48062 Spinal stenosis, lumbar region with neurogenic claudication: Secondary | ICD-10-CM | POA: Diagnosis not present

## 2019-11-18 DIAGNOSIS — M25551 Pain in right hip: Secondary | ICD-10-CM | POA: Diagnosis not present

## 2019-11-18 DIAGNOSIS — M533 Sacrococcygeal disorders, not elsewhere classified: Secondary | ICD-10-CM | POA: Diagnosis not present

## 2019-11-18 DIAGNOSIS — I129 Hypertensive chronic kidney disease with stage 1 through stage 4 chronic kidney disease, or unspecified chronic kidney disease: Secondary | ICD-10-CM | POA: Diagnosis not present

## 2019-11-18 DIAGNOSIS — G894 Chronic pain syndrome: Secondary | ICD-10-CM | POA: Diagnosis not present

## 2019-11-18 DIAGNOSIS — N183 Chronic kidney disease, stage 3 unspecified: Secondary | ICD-10-CM | POA: Diagnosis not present

## 2019-11-18 DIAGNOSIS — M961 Postlaminectomy syndrome, not elsewhere classified: Secondary | ICD-10-CM | POA: Diagnosis not present

## 2019-11-18 DIAGNOSIS — M109 Gout, unspecified: Secondary | ICD-10-CM | POA: Diagnosis not present

## 2019-11-18 DIAGNOSIS — M25529 Pain in unspecified elbow: Secondary | ICD-10-CM | POA: Diagnosis not present

## 2019-11-18 DIAGNOSIS — E785 Hyperlipidemia, unspecified: Secondary | ICD-10-CM | POA: Diagnosis not present

## 2019-11-19 DIAGNOSIS — I493 Ventricular premature depolarization: Secondary | ICD-10-CM | POA: Diagnosis not present

## 2019-11-19 DIAGNOSIS — I498 Other specified cardiac arrhythmias: Secondary | ICD-10-CM | POA: Diagnosis not present

## 2019-11-19 DIAGNOSIS — R600 Localized edema: Secondary | ICD-10-CM | POA: Diagnosis not present

## 2019-11-19 DIAGNOSIS — E559 Vitamin D deficiency, unspecified: Secondary | ICD-10-CM | POA: Diagnosis not present

## 2019-11-19 DIAGNOSIS — Z01818 Encounter for other preprocedural examination: Secondary | ICD-10-CM | POA: Diagnosis not present

## 2019-11-19 DIAGNOSIS — R7303 Prediabetes: Secondary | ICD-10-CM | POA: Diagnosis not present

## 2019-11-19 DIAGNOSIS — I10 Essential (primary) hypertension: Secondary | ICD-10-CM | POA: Diagnosis not present

## 2019-11-19 DIAGNOSIS — J986 Disorders of diaphragm: Secondary | ICD-10-CM | POA: Diagnosis not present

## 2019-11-22 DIAGNOSIS — M4804 Spinal stenosis, thoracic region: Secondary | ICD-10-CM | POA: Diagnosis not present

## 2019-11-22 DIAGNOSIS — Z20822 Contact with and (suspected) exposure to covid-19: Secondary | ICD-10-CM | POA: Diagnosis not present

## 2019-11-22 DIAGNOSIS — Z981 Arthrodesis status: Secondary | ICD-10-CM | POA: Diagnosis not present

## 2019-11-22 DIAGNOSIS — M5030 Other cervical disc degeneration, unspecified cervical region: Secondary | ICD-10-CM | POA: Diagnosis not present

## 2019-11-22 DIAGNOSIS — Z01818 Encounter for other preprocedural examination: Secondary | ICD-10-CM | POA: Diagnosis not present

## 2019-11-22 DIAGNOSIS — M4807 Spinal stenosis, lumbosacral region: Secondary | ICD-10-CM | POA: Diagnosis not present

## 2019-11-22 DIAGNOSIS — M5134 Other intervertebral disc degeneration, thoracic region: Secondary | ICD-10-CM | POA: Diagnosis not present

## 2019-11-22 DIAGNOSIS — E785 Hyperlipidemia, unspecified: Secondary | ICD-10-CM | POA: Diagnosis not present

## 2019-11-22 DIAGNOSIS — M438X9 Other specified deforming dorsopathies, site unspecified: Secondary | ICD-10-CM | POA: Diagnosis not present

## 2019-11-22 DIAGNOSIS — I10 Essential (primary) hypertension: Secondary | ICD-10-CM | POA: Diagnosis not present

## 2019-11-22 DIAGNOSIS — M438X7 Other specified deforming dorsopathies, lumbosacral region: Secondary | ICD-10-CM | POA: Diagnosis not present

## 2019-11-22 DIAGNOSIS — Z79899 Other long term (current) drug therapy: Secondary | ICD-10-CM | POA: Diagnosis not present

## 2019-11-22 DIAGNOSIS — M48061 Spinal stenosis, lumbar region without neurogenic claudication: Secondary | ICD-10-CM | POA: Diagnosis not present

## 2019-11-22 DIAGNOSIS — Z9889 Other specified postprocedural states: Secondary | ICD-10-CM | POA: Diagnosis not present

## 2019-11-22 DIAGNOSIS — Z428 Encounter for other plastic and reconstructive surgery following medical procedure or healed injury: Secondary | ICD-10-CM | POA: Diagnosis not present

## 2019-11-22 DIAGNOSIS — M5136 Other intervertebral disc degeneration, lumbar region: Secondary | ICD-10-CM | POA: Diagnosis not present

## 2019-11-23 DIAGNOSIS — M48061 Spinal stenosis, lumbar region without neurogenic claudication: Secondary | ICD-10-CM | POA: Insufficient documentation

## 2019-12-07 DIAGNOSIS — M109 Gout, unspecified: Secondary | ICD-10-CM | POA: Diagnosis not present

## 2019-12-07 DIAGNOSIS — D72829 Elevated white blood cell count, unspecified: Secondary | ICD-10-CM | POA: Diagnosis not present

## 2019-12-07 DIAGNOSIS — G4700 Insomnia, unspecified: Secondary | ICD-10-CM | POA: Diagnosis not present

## 2019-12-07 DIAGNOSIS — Z743 Need for continuous supervision: Secondary | ICD-10-CM | POA: Diagnosis not present

## 2019-12-07 DIAGNOSIS — B961 Klebsiella pneumoniae [K. pneumoniae] as the cause of diseases classified elsewhere: Secondary | ICD-10-CM | POA: Diagnosis not present

## 2019-12-07 DIAGNOSIS — Z4789 Encounter for other orthopedic aftercare: Secondary | ICD-10-CM | POA: Diagnosis not present

## 2019-12-07 DIAGNOSIS — Z981 Arthrodesis status: Secondary | ICD-10-CM | POA: Diagnosis not present

## 2019-12-07 DIAGNOSIS — R5381 Other malaise: Secondary | ICD-10-CM | POA: Diagnosis not present

## 2019-12-07 DIAGNOSIS — R531 Weakness: Secondary | ICD-10-CM | POA: Diagnosis not present

## 2019-12-07 DIAGNOSIS — K5900 Constipation, unspecified: Secondary | ICD-10-CM | POA: Diagnosis not present

## 2019-12-07 DIAGNOSIS — N390 Urinary tract infection, site not specified: Secondary | ICD-10-CM | POA: Diagnosis not present

## 2019-12-07 DIAGNOSIS — A4902 Methicillin resistant Staphylococcus aureus infection, unspecified site: Secondary | ICD-10-CM | POA: Diagnosis not present

## 2019-12-07 DIAGNOSIS — E785 Hyperlipidemia, unspecified: Secondary | ICD-10-CM | POA: Diagnosis not present

## 2019-12-07 DIAGNOSIS — R5383 Other fatigue: Secondary | ICD-10-CM | POA: Diagnosis not present

## 2019-12-07 DIAGNOSIS — G8929 Other chronic pain: Secondary | ICD-10-CM | POA: Diagnosis not present

## 2019-12-07 DIAGNOSIS — M549 Dorsalgia, unspecified: Secondary | ICD-10-CM | POA: Diagnosis not present

## 2019-12-07 DIAGNOSIS — R600 Localized edema: Secondary | ICD-10-CM | POA: Diagnosis not present

## 2019-12-07 DIAGNOSIS — Z20822 Contact with and (suspected) exposure to covid-19: Secondary | ICD-10-CM | POA: Diagnosis not present

## 2019-12-07 DIAGNOSIS — I10 Essential (primary) hypertension: Secondary | ICD-10-CM | POA: Diagnosis not present

## 2019-12-07 DIAGNOSIS — B356 Tinea cruris: Secondary | ICD-10-CM | POA: Diagnosis not present

## 2019-12-25 DIAGNOSIS — Z79891 Long term (current) use of opiate analgesic: Secondary | ICD-10-CM | POA: Diagnosis not present

## 2019-12-25 DIAGNOSIS — M109 Gout, unspecified: Secondary | ICD-10-CM | POA: Diagnosis not present

## 2019-12-25 DIAGNOSIS — I10 Essential (primary) hypertension: Secondary | ICD-10-CM | POA: Diagnosis not present

## 2019-12-25 DIAGNOSIS — M5136 Other intervertebral disc degeneration, lumbar region: Secondary | ICD-10-CM | POA: Diagnosis not present

## 2019-12-25 DIAGNOSIS — Z79899 Other long term (current) drug therapy: Secondary | ICD-10-CM | POA: Diagnosis not present

## 2019-12-25 DIAGNOSIS — Z4789 Encounter for other orthopedic aftercare: Secondary | ICD-10-CM | POA: Diagnosis not present

## 2019-12-25 DIAGNOSIS — E785 Hyperlipidemia, unspecified: Secondary | ICD-10-CM | POA: Diagnosis not present

## 2019-12-25 DIAGNOSIS — S2242XD Multiple fractures of ribs, left side, subsequent encounter for fracture with routine healing: Secondary | ICD-10-CM | POA: Diagnosis not present

## 2019-12-25 DIAGNOSIS — M48061 Spinal stenosis, lumbar region without neurogenic claudication: Secondary | ICD-10-CM | POA: Diagnosis not present

## 2019-12-25 DIAGNOSIS — E559 Vitamin D deficiency, unspecified: Secondary | ICD-10-CM | POA: Diagnosis not present

## 2019-12-29 DIAGNOSIS — E785 Hyperlipidemia, unspecified: Secondary | ICD-10-CM | POA: Diagnosis not present

## 2019-12-29 DIAGNOSIS — M5136 Other intervertebral disc degeneration, lumbar region: Secondary | ICD-10-CM | POA: Diagnosis not present

## 2019-12-29 DIAGNOSIS — Z4789 Encounter for other orthopedic aftercare: Secondary | ICD-10-CM | POA: Diagnosis not present

## 2019-12-29 DIAGNOSIS — Z79899 Other long term (current) drug therapy: Secondary | ICD-10-CM | POA: Diagnosis not present

## 2019-12-29 DIAGNOSIS — S2242XD Multiple fractures of ribs, left side, subsequent encounter for fracture with routine healing: Secondary | ICD-10-CM | POA: Diagnosis not present

## 2019-12-29 DIAGNOSIS — M48061 Spinal stenosis, lumbar region without neurogenic claudication: Secondary | ICD-10-CM | POA: Diagnosis not present

## 2019-12-29 DIAGNOSIS — M109 Gout, unspecified: Secondary | ICD-10-CM | POA: Diagnosis not present

## 2019-12-29 DIAGNOSIS — I10 Essential (primary) hypertension: Secondary | ICD-10-CM | POA: Diagnosis not present

## 2019-12-29 DIAGNOSIS — Z79891 Long term (current) use of opiate analgesic: Secondary | ICD-10-CM | POA: Diagnosis not present

## 2019-12-29 DIAGNOSIS — E559 Vitamin D deficiency, unspecified: Secondary | ICD-10-CM | POA: Diagnosis not present

## 2019-12-30 DIAGNOSIS — M48061 Spinal stenosis, lumbar region without neurogenic claudication: Secondary | ICD-10-CM | POA: Diagnosis not present

## 2019-12-30 DIAGNOSIS — E559 Vitamin D deficiency, unspecified: Secondary | ICD-10-CM | POA: Diagnosis not present

## 2019-12-30 DIAGNOSIS — M5136 Other intervertebral disc degeneration, lumbar region: Secondary | ICD-10-CM | POA: Diagnosis not present

## 2019-12-30 DIAGNOSIS — S2242XD Multiple fractures of ribs, left side, subsequent encounter for fracture with routine healing: Secondary | ICD-10-CM | POA: Diagnosis not present

## 2019-12-30 DIAGNOSIS — E785 Hyperlipidemia, unspecified: Secondary | ICD-10-CM | POA: Diagnosis not present

## 2019-12-30 DIAGNOSIS — M109 Gout, unspecified: Secondary | ICD-10-CM | POA: Diagnosis not present

## 2019-12-30 DIAGNOSIS — Z79891 Long term (current) use of opiate analgesic: Secondary | ICD-10-CM | POA: Diagnosis not present

## 2019-12-30 DIAGNOSIS — Z4789 Encounter for other orthopedic aftercare: Secondary | ICD-10-CM | POA: Diagnosis not present

## 2019-12-30 DIAGNOSIS — Z79899 Other long term (current) drug therapy: Secondary | ICD-10-CM | POA: Diagnosis not present

## 2019-12-30 DIAGNOSIS — I10 Essential (primary) hypertension: Secondary | ICD-10-CM | POA: Diagnosis not present

## 2020-01-03 DIAGNOSIS — Z79891 Long term (current) use of opiate analgesic: Secondary | ICD-10-CM | POA: Diagnosis not present

## 2020-01-03 DIAGNOSIS — Z79899 Other long term (current) drug therapy: Secondary | ICD-10-CM | POA: Diagnosis not present

## 2020-01-03 DIAGNOSIS — I10 Essential (primary) hypertension: Secondary | ICD-10-CM | POA: Diagnosis not present

## 2020-01-03 DIAGNOSIS — M109 Gout, unspecified: Secondary | ICD-10-CM | POA: Diagnosis not present

## 2020-01-03 DIAGNOSIS — Z4789 Encounter for other orthopedic aftercare: Secondary | ICD-10-CM | POA: Diagnosis not present

## 2020-01-03 DIAGNOSIS — E785 Hyperlipidemia, unspecified: Secondary | ICD-10-CM | POA: Diagnosis not present

## 2020-01-03 DIAGNOSIS — S2242XD Multiple fractures of ribs, left side, subsequent encounter for fracture with routine healing: Secondary | ICD-10-CM | POA: Diagnosis not present

## 2020-01-03 DIAGNOSIS — E559 Vitamin D deficiency, unspecified: Secondary | ICD-10-CM | POA: Diagnosis not present

## 2020-01-03 DIAGNOSIS — M48061 Spinal stenosis, lumbar region without neurogenic claudication: Secondary | ICD-10-CM | POA: Diagnosis not present

## 2020-01-03 DIAGNOSIS — M5136 Other intervertebral disc degeneration, lumbar region: Secondary | ICD-10-CM | POA: Diagnosis not present

## 2020-01-04 DIAGNOSIS — Z981 Arthrodesis status: Secondary | ICD-10-CM | POA: Diagnosis not present

## 2020-01-04 DIAGNOSIS — M5134 Other intervertebral disc degeneration, thoracic region: Secondary | ICD-10-CM | POA: Diagnosis not present

## 2020-01-04 DIAGNOSIS — M5136 Other intervertebral disc degeneration, lumbar region: Secondary | ICD-10-CM | POA: Diagnosis not present

## 2020-01-04 DIAGNOSIS — M545 Low back pain: Secondary | ICD-10-CM | POA: Diagnosis not present

## 2020-01-05 DIAGNOSIS — M109 Gout, unspecified: Secondary | ICD-10-CM | POA: Diagnosis not present

## 2020-01-05 DIAGNOSIS — Z79891 Long term (current) use of opiate analgesic: Secondary | ICD-10-CM | POA: Diagnosis not present

## 2020-01-05 DIAGNOSIS — S2242XD Multiple fractures of ribs, left side, subsequent encounter for fracture with routine healing: Secondary | ICD-10-CM | POA: Diagnosis not present

## 2020-01-05 DIAGNOSIS — Z79899 Other long term (current) drug therapy: Secondary | ICD-10-CM | POA: Diagnosis not present

## 2020-01-05 DIAGNOSIS — I10 Essential (primary) hypertension: Secondary | ICD-10-CM | POA: Diagnosis not present

## 2020-01-05 DIAGNOSIS — M5136 Other intervertebral disc degeneration, lumbar region: Secondary | ICD-10-CM | POA: Diagnosis not present

## 2020-01-05 DIAGNOSIS — Z4789 Encounter for other orthopedic aftercare: Secondary | ICD-10-CM | POA: Diagnosis not present

## 2020-01-05 DIAGNOSIS — E785 Hyperlipidemia, unspecified: Secondary | ICD-10-CM | POA: Diagnosis not present

## 2020-01-05 DIAGNOSIS — M48061 Spinal stenosis, lumbar region without neurogenic claudication: Secondary | ICD-10-CM | POA: Diagnosis not present

## 2020-01-05 DIAGNOSIS — E559 Vitamin D deficiency, unspecified: Secondary | ICD-10-CM | POA: Diagnosis not present

## 2020-01-06 DIAGNOSIS — Z79899 Other long term (current) drug therapy: Secondary | ICD-10-CM | POA: Diagnosis not present

## 2020-01-06 DIAGNOSIS — I10 Essential (primary) hypertension: Secondary | ICD-10-CM | POA: Diagnosis not present

## 2020-01-06 DIAGNOSIS — E559 Vitamin D deficiency, unspecified: Secondary | ICD-10-CM | POA: Diagnosis not present

## 2020-01-06 DIAGNOSIS — S2242XD Multiple fractures of ribs, left side, subsequent encounter for fracture with routine healing: Secondary | ICD-10-CM | POA: Diagnosis not present

## 2020-01-06 DIAGNOSIS — E785 Hyperlipidemia, unspecified: Secondary | ICD-10-CM | POA: Diagnosis not present

## 2020-01-06 DIAGNOSIS — M48061 Spinal stenosis, lumbar region without neurogenic claudication: Secondary | ICD-10-CM | POA: Diagnosis not present

## 2020-01-06 DIAGNOSIS — Z4789 Encounter for other orthopedic aftercare: Secondary | ICD-10-CM | POA: Diagnosis not present

## 2020-01-06 DIAGNOSIS — M5136 Other intervertebral disc degeneration, lumbar region: Secondary | ICD-10-CM | POA: Diagnosis not present

## 2020-01-06 DIAGNOSIS — Z79891 Long term (current) use of opiate analgesic: Secondary | ICD-10-CM | POA: Diagnosis not present

## 2020-01-06 DIAGNOSIS — M109 Gout, unspecified: Secondary | ICD-10-CM | POA: Diagnosis not present

## 2020-01-10 DIAGNOSIS — M5136 Other intervertebral disc degeneration, lumbar region: Secondary | ICD-10-CM | POA: Diagnosis not present

## 2020-01-10 DIAGNOSIS — M109 Gout, unspecified: Secondary | ICD-10-CM | POA: Diagnosis not present

## 2020-01-10 DIAGNOSIS — I10 Essential (primary) hypertension: Secondary | ICD-10-CM | POA: Diagnosis not present

## 2020-01-10 DIAGNOSIS — S2242XD Multiple fractures of ribs, left side, subsequent encounter for fracture with routine healing: Secondary | ICD-10-CM | POA: Diagnosis not present

## 2020-01-10 DIAGNOSIS — Z79899 Other long term (current) drug therapy: Secondary | ICD-10-CM | POA: Diagnosis not present

## 2020-01-10 DIAGNOSIS — M48061 Spinal stenosis, lumbar region without neurogenic claudication: Secondary | ICD-10-CM | POA: Diagnosis not present

## 2020-01-10 DIAGNOSIS — Z4789 Encounter for other orthopedic aftercare: Secondary | ICD-10-CM | POA: Diagnosis not present

## 2020-01-10 DIAGNOSIS — Z79891 Long term (current) use of opiate analgesic: Secondary | ICD-10-CM | POA: Diagnosis not present

## 2020-01-10 DIAGNOSIS — E559 Vitamin D deficiency, unspecified: Secondary | ICD-10-CM | POA: Diagnosis not present

## 2020-01-10 DIAGNOSIS — E785 Hyperlipidemia, unspecified: Secondary | ICD-10-CM | POA: Diagnosis not present

## 2020-01-12 DIAGNOSIS — Z79899 Other long term (current) drug therapy: Secondary | ICD-10-CM | POA: Diagnosis not present

## 2020-01-12 DIAGNOSIS — Z79891 Long term (current) use of opiate analgesic: Secondary | ICD-10-CM | POA: Diagnosis not present

## 2020-01-12 DIAGNOSIS — M5136 Other intervertebral disc degeneration, lumbar region: Secondary | ICD-10-CM | POA: Diagnosis not present

## 2020-01-12 DIAGNOSIS — M109 Gout, unspecified: Secondary | ICD-10-CM | POA: Diagnosis not present

## 2020-01-12 DIAGNOSIS — S2242XD Multiple fractures of ribs, left side, subsequent encounter for fracture with routine healing: Secondary | ICD-10-CM | POA: Diagnosis not present

## 2020-01-12 DIAGNOSIS — E559 Vitamin D deficiency, unspecified: Secondary | ICD-10-CM | POA: Diagnosis not present

## 2020-01-12 DIAGNOSIS — M48061 Spinal stenosis, lumbar region without neurogenic claudication: Secondary | ICD-10-CM | POA: Diagnosis not present

## 2020-01-12 DIAGNOSIS — I10 Essential (primary) hypertension: Secondary | ICD-10-CM | POA: Diagnosis not present

## 2020-01-12 DIAGNOSIS — E785 Hyperlipidemia, unspecified: Secondary | ICD-10-CM | POA: Diagnosis not present

## 2020-01-12 DIAGNOSIS — Z4789 Encounter for other orthopedic aftercare: Secondary | ICD-10-CM | POA: Diagnosis not present

## 2020-01-13 DIAGNOSIS — M109 Gout, unspecified: Secondary | ICD-10-CM | POA: Diagnosis not present

## 2020-01-13 DIAGNOSIS — E559 Vitamin D deficiency, unspecified: Secondary | ICD-10-CM | POA: Diagnosis not present

## 2020-01-13 DIAGNOSIS — I10 Essential (primary) hypertension: Secondary | ICD-10-CM | POA: Diagnosis not present

## 2020-01-13 DIAGNOSIS — Z79891 Long term (current) use of opiate analgesic: Secondary | ICD-10-CM | POA: Diagnosis not present

## 2020-01-13 DIAGNOSIS — S2242XD Multiple fractures of ribs, left side, subsequent encounter for fracture with routine healing: Secondary | ICD-10-CM | POA: Diagnosis not present

## 2020-01-13 DIAGNOSIS — E785 Hyperlipidemia, unspecified: Secondary | ICD-10-CM | POA: Diagnosis not present

## 2020-01-13 DIAGNOSIS — M5136 Other intervertebral disc degeneration, lumbar region: Secondary | ICD-10-CM | POA: Diagnosis not present

## 2020-01-13 DIAGNOSIS — Z4789 Encounter for other orthopedic aftercare: Secondary | ICD-10-CM | POA: Diagnosis not present

## 2020-01-13 DIAGNOSIS — M48061 Spinal stenosis, lumbar region without neurogenic claudication: Secondary | ICD-10-CM | POA: Diagnosis not present

## 2020-01-13 DIAGNOSIS — Z79899 Other long term (current) drug therapy: Secondary | ICD-10-CM | POA: Diagnosis not present

## 2020-01-18 DIAGNOSIS — S2242XD Multiple fractures of ribs, left side, subsequent encounter for fracture with routine healing: Secondary | ICD-10-CM | POA: Diagnosis not present

## 2020-01-18 DIAGNOSIS — Z79899 Other long term (current) drug therapy: Secondary | ICD-10-CM | POA: Diagnosis not present

## 2020-01-18 DIAGNOSIS — Z79891 Long term (current) use of opiate analgesic: Secondary | ICD-10-CM | POA: Diagnosis not present

## 2020-01-18 DIAGNOSIS — M48061 Spinal stenosis, lumbar region without neurogenic claudication: Secondary | ICD-10-CM | POA: Diagnosis not present

## 2020-01-18 DIAGNOSIS — M5136 Other intervertebral disc degeneration, lumbar region: Secondary | ICD-10-CM | POA: Diagnosis not present

## 2020-01-18 DIAGNOSIS — E785 Hyperlipidemia, unspecified: Secondary | ICD-10-CM | POA: Diagnosis not present

## 2020-01-18 DIAGNOSIS — M109 Gout, unspecified: Secondary | ICD-10-CM | POA: Diagnosis not present

## 2020-01-18 DIAGNOSIS — E559 Vitamin D deficiency, unspecified: Secondary | ICD-10-CM | POA: Diagnosis not present

## 2020-01-18 DIAGNOSIS — Z4789 Encounter for other orthopedic aftercare: Secondary | ICD-10-CM | POA: Diagnosis not present

## 2020-01-18 DIAGNOSIS — I10 Essential (primary) hypertension: Secondary | ICD-10-CM | POA: Diagnosis not present

## 2020-01-20 DIAGNOSIS — M5136 Other intervertebral disc degeneration, lumbar region: Secondary | ICD-10-CM | POA: Diagnosis not present

## 2020-01-20 DIAGNOSIS — M48061 Spinal stenosis, lumbar region without neurogenic claudication: Secondary | ICD-10-CM | POA: Diagnosis not present

## 2020-01-20 DIAGNOSIS — S2242XD Multiple fractures of ribs, left side, subsequent encounter for fracture with routine healing: Secondary | ICD-10-CM | POA: Diagnosis not present

## 2020-01-20 DIAGNOSIS — E559 Vitamin D deficiency, unspecified: Secondary | ICD-10-CM | POA: Diagnosis not present

## 2020-01-20 DIAGNOSIS — I10 Essential (primary) hypertension: Secondary | ICD-10-CM | POA: Diagnosis not present

## 2020-01-20 DIAGNOSIS — Z4789 Encounter for other orthopedic aftercare: Secondary | ICD-10-CM | POA: Diagnosis not present

## 2020-01-20 DIAGNOSIS — E785 Hyperlipidemia, unspecified: Secondary | ICD-10-CM | POA: Diagnosis not present

## 2020-01-20 DIAGNOSIS — M109 Gout, unspecified: Secondary | ICD-10-CM | POA: Diagnosis not present

## 2020-01-20 DIAGNOSIS — Z79891 Long term (current) use of opiate analgesic: Secondary | ICD-10-CM | POA: Diagnosis not present

## 2020-01-20 DIAGNOSIS — Z79899 Other long term (current) drug therapy: Secondary | ICD-10-CM | POA: Diagnosis not present

## 2020-01-25 DIAGNOSIS — I10 Essential (primary) hypertension: Secondary | ICD-10-CM | POA: Diagnosis not present

## 2020-01-25 DIAGNOSIS — Z79899 Other long term (current) drug therapy: Secondary | ICD-10-CM | POA: Diagnosis not present

## 2020-01-25 DIAGNOSIS — Z79891 Long term (current) use of opiate analgesic: Secondary | ICD-10-CM | POA: Diagnosis not present

## 2020-01-25 DIAGNOSIS — Z4789 Encounter for other orthopedic aftercare: Secondary | ICD-10-CM | POA: Diagnosis not present

## 2020-01-25 DIAGNOSIS — M109 Gout, unspecified: Secondary | ICD-10-CM | POA: Diagnosis not present

## 2020-01-25 DIAGNOSIS — S2242XD Multiple fractures of ribs, left side, subsequent encounter for fracture with routine healing: Secondary | ICD-10-CM | POA: Diagnosis not present

## 2020-01-25 DIAGNOSIS — M48061 Spinal stenosis, lumbar region without neurogenic claudication: Secondary | ICD-10-CM | POA: Diagnosis not present

## 2020-01-25 DIAGNOSIS — E785 Hyperlipidemia, unspecified: Secondary | ICD-10-CM | POA: Diagnosis not present

## 2020-01-25 DIAGNOSIS — M5136 Other intervertebral disc degeneration, lumbar region: Secondary | ICD-10-CM | POA: Diagnosis not present

## 2020-01-25 DIAGNOSIS — E559 Vitamin D deficiency, unspecified: Secondary | ICD-10-CM | POA: Diagnosis not present

## 2020-01-28 DIAGNOSIS — Z79891 Long term (current) use of opiate analgesic: Secondary | ICD-10-CM | POA: Diagnosis not present

## 2020-01-28 DIAGNOSIS — M48061 Spinal stenosis, lumbar region without neurogenic claudication: Secondary | ICD-10-CM | POA: Diagnosis not present

## 2020-01-28 DIAGNOSIS — E559 Vitamin D deficiency, unspecified: Secondary | ICD-10-CM | POA: Diagnosis not present

## 2020-01-28 DIAGNOSIS — Z4789 Encounter for other orthopedic aftercare: Secondary | ICD-10-CM | POA: Diagnosis not present

## 2020-01-28 DIAGNOSIS — E785 Hyperlipidemia, unspecified: Secondary | ICD-10-CM | POA: Diagnosis not present

## 2020-01-28 DIAGNOSIS — Z79899 Other long term (current) drug therapy: Secondary | ICD-10-CM | POA: Diagnosis not present

## 2020-01-28 DIAGNOSIS — I10 Essential (primary) hypertension: Secondary | ICD-10-CM | POA: Diagnosis not present

## 2020-01-28 DIAGNOSIS — M109 Gout, unspecified: Secondary | ICD-10-CM | POA: Diagnosis not present

## 2020-01-28 DIAGNOSIS — M5136 Other intervertebral disc degeneration, lumbar region: Secondary | ICD-10-CM | POA: Diagnosis not present

## 2020-01-28 DIAGNOSIS — S2242XD Multiple fractures of ribs, left side, subsequent encounter for fracture with routine healing: Secondary | ICD-10-CM | POA: Diagnosis not present

## 2020-01-31 DIAGNOSIS — R600 Localized edema: Secondary | ICD-10-CM | POA: Diagnosis not present

## 2020-01-31 DIAGNOSIS — Z0000 Encounter for general adult medical examination without abnormal findings: Secondary | ICD-10-CM | POA: Diagnosis not present

## 2020-01-31 DIAGNOSIS — R918 Other nonspecific abnormal finding of lung field: Secondary | ICD-10-CM | POA: Diagnosis not present

## 2020-02-02 DIAGNOSIS — M48061 Spinal stenosis, lumbar region without neurogenic claudication: Secondary | ICD-10-CM | POA: Diagnosis not present

## 2020-02-02 DIAGNOSIS — E785 Hyperlipidemia, unspecified: Secondary | ICD-10-CM | POA: Diagnosis not present

## 2020-02-02 DIAGNOSIS — E559 Vitamin D deficiency, unspecified: Secondary | ICD-10-CM | POA: Diagnosis not present

## 2020-02-02 DIAGNOSIS — M109 Gout, unspecified: Secondary | ICD-10-CM | POA: Diagnosis not present

## 2020-02-02 DIAGNOSIS — I10 Essential (primary) hypertension: Secondary | ICD-10-CM | POA: Diagnosis not present

## 2020-02-02 DIAGNOSIS — M5136 Other intervertebral disc degeneration, lumbar region: Secondary | ICD-10-CM | POA: Diagnosis not present

## 2020-02-02 DIAGNOSIS — Z4789 Encounter for other orthopedic aftercare: Secondary | ICD-10-CM | POA: Diagnosis not present

## 2020-02-02 DIAGNOSIS — Z79899 Other long term (current) drug therapy: Secondary | ICD-10-CM | POA: Diagnosis not present

## 2020-02-02 DIAGNOSIS — Z79891 Long term (current) use of opiate analgesic: Secondary | ICD-10-CM | POA: Diagnosis not present

## 2020-02-02 DIAGNOSIS — S2242XD Multiple fractures of ribs, left side, subsequent encounter for fracture with routine healing: Secondary | ICD-10-CM | POA: Diagnosis not present

## 2020-02-09 DIAGNOSIS — I700 Atherosclerosis of aorta: Secondary | ICD-10-CM | POA: Diagnosis not present

## 2020-02-09 DIAGNOSIS — M109 Gout, unspecified: Secondary | ICD-10-CM | POA: Diagnosis not present

## 2020-02-09 DIAGNOSIS — I2510 Atherosclerotic heart disease of native coronary artery without angina pectoris: Secondary | ICD-10-CM | POA: Diagnosis not present

## 2020-02-09 DIAGNOSIS — R918 Other nonspecific abnormal finding of lung field: Secondary | ICD-10-CM | POA: Diagnosis not present

## 2020-02-09 DIAGNOSIS — E559 Vitamin D deficiency, unspecified: Secondary | ICD-10-CM | POA: Diagnosis not present

## 2020-02-09 DIAGNOSIS — Z79891 Long term (current) use of opiate analgesic: Secondary | ICD-10-CM | POA: Diagnosis not present

## 2020-02-09 DIAGNOSIS — I10 Essential (primary) hypertension: Secondary | ICD-10-CM | POA: Diagnosis not present

## 2020-02-09 DIAGNOSIS — J984 Other disorders of lung: Secondary | ICD-10-CM | POA: Diagnosis not present

## 2020-02-09 DIAGNOSIS — S2242XA Multiple fractures of ribs, left side, initial encounter for closed fracture: Secondary | ICD-10-CM | POA: Diagnosis not present

## 2020-02-09 DIAGNOSIS — E785 Hyperlipidemia, unspecified: Secondary | ICD-10-CM | POA: Diagnosis not present

## 2020-02-09 DIAGNOSIS — M5136 Other intervertebral disc degeneration, lumbar region: Secondary | ICD-10-CM | POA: Diagnosis not present

## 2020-02-09 DIAGNOSIS — M48061 Spinal stenosis, lumbar region without neurogenic claudication: Secondary | ICD-10-CM | POA: Diagnosis not present

## 2020-02-09 DIAGNOSIS — Z79899 Other long term (current) drug therapy: Secondary | ICD-10-CM | POA: Diagnosis not present

## 2020-02-09 DIAGNOSIS — S2242XD Multiple fractures of ribs, left side, subsequent encounter for fracture with routine healing: Secondary | ICD-10-CM | POA: Diagnosis not present

## 2020-02-09 DIAGNOSIS — Z4789 Encounter for other orthopedic aftercare: Secondary | ICD-10-CM | POA: Diagnosis not present

## 2020-02-14 DIAGNOSIS — Z79891 Long term (current) use of opiate analgesic: Secondary | ICD-10-CM | POA: Diagnosis not present

## 2020-02-14 DIAGNOSIS — Z79899 Other long term (current) drug therapy: Secondary | ICD-10-CM | POA: Diagnosis not present

## 2020-02-14 DIAGNOSIS — M48061 Spinal stenosis, lumbar region without neurogenic claudication: Secondary | ICD-10-CM | POA: Diagnosis not present

## 2020-02-14 DIAGNOSIS — E785 Hyperlipidemia, unspecified: Secondary | ICD-10-CM | POA: Diagnosis not present

## 2020-02-14 DIAGNOSIS — M5136 Other intervertebral disc degeneration, lumbar region: Secondary | ICD-10-CM | POA: Diagnosis not present

## 2020-02-14 DIAGNOSIS — Z4789 Encounter for other orthopedic aftercare: Secondary | ICD-10-CM | POA: Diagnosis not present

## 2020-02-14 DIAGNOSIS — M109 Gout, unspecified: Secondary | ICD-10-CM | POA: Diagnosis not present

## 2020-02-14 DIAGNOSIS — E559 Vitamin D deficiency, unspecified: Secondary | ICD-10-CM | POA: Diagnosis not present

## 2020-02-14 DIAGNOSIS — S2242XD Multiple fractures of ribs, left side, subsequent encounter for fracture with routine healing: Secondary | ICD-10-CM | POA: Diagnosis not present

## 2020-02-14 DIAGNOSIS — I10 Essential (primary) hypertension: Secondary | ICD-10-CM | POA: Diagnosis not present

## 2020-02-21 DIAGNOSIS — I10 Essential (primary) hypertension: Secondary | ICD-10-CM | POA: Diagnosis not present

## 2020-02-21 DIAGNOSIS — M109 Gout, unspecified: Secondary | ICD-10-CM | POA: Diagnosis not present

## 2020-02-21 DIAGNOSIS — Z79891 Long term (current) use of opiate analgesic: Secondary | ICD-10-CM | POA: Diagnosis not present

## 2020-02-21 DIAGNOSIS — S2242XD Multiple fractures of ribs, left side, subsequent encounter for fracture with routine healing: Secondary | ICD-10-CM | POA: Diagnosis not present

## 2020-02-21 DIAGNOSIS — E559 Vitamin D deficiency, unspecified: Secondary | ICD-10-CM | POA: Diagnosis not present

## 2020-02-21 DIAGNOSIS — Z4789 Encounter for other orthopedic aftercare: Secondary | ICD-10-CM | POA: Diagnosis not present

## 2020-02-21 DIAGNOSIS — E785 Hyperlipidemia, unspecified: Secondary | ICD-10-CM | POA: Diagnosis not present

## 2020-02-21 DIAGNOSIS — M48061 Spinal stenosis, lumbar region without neurogenic claudication: Secondary | ICD-10-CM | POA: Diagnosis not present

## 2020-02-21 DIAGNOSIS — M5136 Other intervertebral disc degeneration, lumbar region: Secondary | ICD-10-CM | POA: Diagnosis not present

## 2020-02-21 DIAGNOSIS — Z79899 Other long term (current) drug therapy: Secondary | ICD-10-CM | POA: Diagnosis not present

## 2020-03-01 DIAGNOSIS — M5136 Other intervertebral disc degeneration, lumbar region: Secondary | ICD-10-CM | POA: Diagnosis not present

## 2020-03-01 DIAGNOSIS — M109 Gout, unspecified: Secondary | ICD-10-CM | POA: Diagnosis not present

## 2020-03-01 DIAGNOSIS — M48061 Spinal stenosis, lumbar region without neurogenic claudication: Secondary | ICD-10-CM | POA: Diagnosis not present

## 2020-03-01 DIAGNOSIS — E559 Vitamin D deficiency, unspecified: Secondary | ICD-10-CM | POA: Diagnosis not present

## 2020-03-01 DIAGNOSIS — E785 Hyperlipidemia, unspecified: Secondary | ICD-10-CM | POA: Diagnosis not present

## 2020-03-01 DIAGNOSIS — Z79899 Other long term (current) drug therapy: Secondary | ICD-10-CM | POA: Diagnosis not present

## 2020-03-01 DIAGNOSIS — I10 Essential (primary) hypertension: Secondary | ICD-10-CM | POA: Diagnosis not present

## 2020-03-01 DIAGNOSIS — S2242XD Multiple fractures of ribs, left side, subsequent encounter for fracture with routine healing: Secondary | ICD-10-CM | POA: Diagnosis not present

## 2020-03-01 DIAGNOSIS — Z4789 Encounter for other orthopedic aftercare: Secondary | ICD-10-CM | POA: Diagnosis not present

## 2020-03-01 DIAGNOSIS — Z79891 Long term (current) use of opiate analgesic: Secondary | ICD-10-CM | POA: Diagnosis not present

## 2020-03-02 DIAGNOSIS — G894 Chronic pain syndrome: Secondary | ICD-10-CM | POA: Diagnosis not present

## 2020-03-02 DIAGNOSIS — I129 Hypertensive chronic kidney disease with stage 1 through stage 4 chronic kidney disease, or unspecified chronic kidney disease: Secondary | ICD-10-CM | POA: Diagnosis not present

## 2020-03-02 DIAGNOSIS — M109 Gout, unspecified: Secondary | ICD-10-CM | POA: Diagnosis not present

## 2020-03-02 DIAGNOSIS — N183 Chronic kidney disease, stage 3 unspecified: Secondary | ICD-10-CM | POA: Diagnosis not present

## 2020-03-02 DIAGNOSIS — E785 Hyperlipidemia, unspecified: Secondary | ICD-10-CM | POA: Diagnosis not present

## 2020-03-07 DIAGNOSIS — M5136 Other intervertebral disc degeneration, lumbar region: Secondary | ICD-10-CM | POA: Diagnosis not present

## 2020-03-07 DIAGNOSIS — Z4789 Encounter for other orthopedic aftercare: Secondary | ICD-10-CM | POA: Diagnosis not present

## 2020-03-07 DIAGNOSIS — I10 Essential (primary) hypertension: Secondary | ICD-10-CM | POA: Diagnosis not present

## 2020-03-07 DIAGNOSIS — Z79899 Other long term (current) drug therapy: Secondary | ICD-10-CM | POA: Diagnosis not present

## 2020-03-07 DIAGNOSIS — M109 Gout, unspecified: Secondary | ICD-10-CM | POA: Diagnosis not present

## 2020-03-07 DIAGNOSIS — S2242XD Multiple fractures of ribs, left side, subsequent encounter for fracture with routine healing: Secondary | ICD-10-CM | POA: Diagnosis not present

## 2020-03-07 DIAGNOSIS — Z79891 Long term (current) use of opiate analgesic: Secondary | ICD-10-CM | POA: Diagnosis not present

## 2020-03-07 DIAGNOSIS — E785 Hyperlipidemia, unspecified: Secondary | ICD-10-CM | POA: Diagnosis not present

## 2020-03-07 DIAGNOSIS — M48061 Spinal stenosis, lumbar region without neurogenic claudication: Secondary | ICD-10-CM | POA: Diagnosis not present

## 2020-03-07 DIAGNOSIS — E559 Vitamin D deficiency, unspecified: Secondary | ICD-10-CM | POA: Diagnosis not present

## 2020-03-14 DIAGNOSIS — M48061 Spinal stenosis, lumbar region without neurogenic claudication: Secondary | ICD-10-CM | POA: Diagnosis not present

## 2020-03-14 DIAGNOSIS — I10 Essential (primary) hypertension: Secondary | ICD-10-CM | POA: Diagnosis not present

## 2020-03-14 DIAGNOSIS — Z79899 Other long term (current) drug therapy: Secondary | ICD-10-CM | POA: Diagnosis not present

## 2020-03-14 DIAGNOSIS — S2242XD Multiple fractures of ribs, left side, subsequent encounter for fracture with routine healing: Secondary | ICD-10-CM | POA: Diagnosis not present

## 2020-03-14 DIAGNOSIS — Z4789 Encounter for other orthopedic aftercare: Secondary | ICD-10-CM | POA: Diagnosis not present

## 2020-03-14 DIAGNOSIS — E559 Vitamin D deficiency, unspecified: Secondary | ICD-10-CM | POA: Diagnosis not present

## 2020-03-14 DIAGNOSIS — M5136 Other intervertebral disc degeneration, lumbar region: Secondary | ICD-10-CM | POA: Diagnosis not present

## 2020-03-14 DIAGNOSIS — M109 Gout, unspecified: Secondary | ICD-10-CM | POA: Diagnosis not present

## 2020-03-14 DIAGNOSIS — E785 Hyperlipidemia, unspecified: Secondary | ICD-10-CM | POA: Diagnosis not present

## 2020-03-14 DIAGNOSIS — Z79891 Long term (current) use of opiate analgesic: Secondary | ICD-10-CM | POA: Diagnosis not present

## 2020-03-17 DIAGNOSIS — M720 Palmar fascial fibromatosis [Dupuytren]: Secondary | ICD-10-CM | POA: Diagnosis not present

## 2020-03-20 DIAGNOSIS — M545 Low back pain: Secondary | ICD-10-CM | POA: Diagnosis not present

## 2020-03-20 DIAGNOSIS — M21161 Varus deformity, not elsewhere classified, right knee: Secondary | ICD-10-CM | POA: Diagnosis not present

## 2020-03-20 DIAGNOSIS — M5030 Other cervical disc degeneration, unspecified cervical region: Secondary | ICD-10-CM | POA: Diagnosis not present

## 2020-03-20 DIAGNOSIS — M5136 Other intervertebral disc degeneration, lumbar region: Secondary | ICD-10-CM | POA: Diagnosis not present

## 2020-03-20 DIAGNOSIS — M5134 Other intervertebral disc degeneration, thoracic region: Secondary | ICD-10-CM | POA: Diagnosis not present

## 2020-03-20 DIAGNOSIS — Z981 Arthrodesis status: Secondary | ICD-10-CM | POA: Diagnosis not present

## 2020-03-23 DIAGNOSIS — M48061 Spinal stenosis, lumbar region without neurogenic claudication: Secondary | ICD-10-CM | POA: Diagnosis not present

## 2020-03-23 DIAGNOSIS — S2242XD Multiple fractures of ribs, left side, subsequent encounter for fracture with routine healing: Secondary | ICD-10-CM | POA: Diagnosis not present

## 2020-03-23 DIAGNOSIS — E785 Hyperlipidemia, unspecified: Secondary | ICD-10-CM | POA: Diagnosis not present

## 2020-03-23 DIAGNOSIS — E559 Vitamin D deficiency, unspecified: Secondary | ICD-10-CM | POA: Diagnosis not present

## 2020-03-23 DIAGNOSIS — Z79899 Other long term (current) drug therapy: Secondary | ICD-10-CM | POA: Diagnosis not present

## 2020-03-23 DIAGNOSIS — Z79891 Long term (current) use of opiate analgesic: Secondary | ICD-10-CM | POA: Diagnosis not present

## 2020-03-23 DIAGNOSIS — I10 Essential (primary) hypertension: Secondary | ICD-10-CM | POA: Diagnosis not present

## 2020-03-23 DIAGNOSIS — M109 Gout, unspecified: Secondary | ICD-10-CM | POA: Diagnosis not present

## 2020-03-23 DIAGNOSIS — M5136 Other intervertebral disc degeneration, lumbar region: Secondary | ICD-10-CM | POA: Diagnosis not present

## 2020-03-23 DIAGNOSIS — Z4789 Encounter for other orthopedic aftercare: Secondary | ICD-10-CM | POA: Diagnosis not present

## 2020-03-28 DIAGNOSIS — Z79891 Long term (current) use of opiate analgesic: Secondary | ICD-10-CM | POA: Diagnosis not present

## 2020-03-28 DIAGNOSIS — M5136 Other intervertebral disc degeneration, lumbar region: Secondary | ICD-10-CM | POA: Diagnosis not present

## 2020-03-28 DIAGNOSIS — S2242XD Multiple fractures of ribs, left side, subsequent encounter for fracture with routine healing: Secondary | ICD-10-CM | POA: Diagnosis not present

## 2020-03-28 DIAGNOSIS — M48061 Spinal stenosis, lumbar region without neurogenic claudication: Secondary | ICD-10-CM | POA: Diagnosis not present

## 2020-03-28 DIAGNOSIS — I10 Essential (primary) hypertension: Secondary | ICD-10-CM | POA: Diagnosis not present

## 2020-03-28 DIAGNOSIS — Z4789 Encounter for other orthopedic aftercare: Secondary | ICD-10-CM | POA: Diagnosis not present

## 2020-03-28 DIAGNOSIS — Z79899 Other long term (current) drug therapy: Secondary | ICD-10-CM | POA: Diagnosis not present

## 2020-03-28 DIAGNOSIS — M109 Gout, unspecified: Secondary | ICD-10-CM | POA: Diagnosis not present

## 2020-03-28 DIAGNOSIS — E559 Vitamin D deficiency, unspecified: Secondary | ICD-10-CM | POA: Diagnosis not present

## 2020-03-28 DIAGNOSIS — E785 Hyperlipidemia, unspecified: Secondary | ICD-10-CM | POA: Diagnosis not present

## 2020-04-07 DIAGNOSIS — Z4789 Encounter for other orthopedic aftercare: Secondary | ICD-10-CM | POA: Diagnosis not present

## 2020-04-07 DIAGNOSIS — E785 Hyperlipidemia, unspecified: Secondary | ICD-10-CM | POA: Diagnosis not present

## 2020-04-07 DIAGNOSIS — I10 Essential (primary) hypertension: Secondary | ICD-10-CM | POA: Diagnosis not present

## 2020-04-07 DIAGNOSIS — M5136 Other intervertebral disc degeneration, lumbar region: Secondary | ICD-10-CM | POA: Diagnosis not present

## 2020-04-07 DIAGNOSIS — M109 Gout, unspecified: Secondary | ICD-10-CM | POA: Diagnosis not present

## 2020-04-07 DIAGNOSIS — Z79899 Other long term (current) drug therapy: Secondary | ICD-10-CM | POA: Diagnosis not present

## 2020-04-07 DIAGNOSIS — S2242XD Multiple fractures of ribs, left side, subsequent encounter for fracture with routine healing: Secondary | ICD-10-CM | POA: Diagnosis not present

## 2020-04-07 DIAGNOSIS — E559 Vitamin D deficiency, unspecified: Secondary | ICD-10-CM | POA: Diagnosis not present

## 2020-04-07 DIAGNOSIS — M48061 Spinal stenosis, lumbar region without neurogenic claudication: Secondary | ICD-10-CM | POA: Diagnosis not present

## 2020-04-07 DIAGNOSIS — Z79891 Long term (current) use of opiate analgesic: Secondary | ICD-10-CM | POA: Diagnosis not present

## 2020-04-10 DIAGNOSIS — M5136 Other intervertebral disc degeneration, lumbar region: Secondary | ICD-10-CM | POA: Diagnosis not present

## 2020-04-10 DIAGNOSIS — S2242XD Multiple fractures of ribs, left side, subsequent encounter for fracture with routine healing: Secondary | ICD-10-CM | POA: Diagnosis not present

## 2020-04-10 DIAGNOSIS — M109 Gout, unspecified: Secondary | ICD-10-CM | POA: Diagnosis not present

## 2020-04-10 DIAGNOSIS — E559 Vitamin D deficiency, unspecified: Secondary | ICD-10-CM | POA: Diagnosis not present

## 2020-04-10 DIAGNOSIS — M48061 Spinal stenosis, lumbar region without neurogenic claudication: Secondary | ICD-10-CM | POA: Diagnosis not present

## 2020-04-10 DIAGNOSIS — Z79891 Long term (current) use of opiate analgesic: Secondary | ICD-10-CM | POA: Diagnosis not present

## 2020-04-10 DIAGNOSIS — E785 Hyperlipidemia, unspecified: Secondary | ICD-10-CM | POA: Diagnosis not present

## 2020-04-10 DIAGNOSIS — Z79899 Other long term (current) drug therapy: Secondary | ICD-10-CM | POA: Diagnosis not present

## 2020-04-10 DIAGNOSIS — Z4789 Encounter for other orthopedic aftercare: Secondary | ICD-10-CM | POA: Diagnosis not present

## 2020-04-10 DIAGNOSIS — I10 Essential (primary) hypertension: Secondary | ICD-10-CM | POA: Diagnosis not present

## 2020-04-19 DIAGNOSIS — M109 Gout, unspecified: Secondary | ICD-10-CM | POA: Diagnosis not present

## 2020-04-19 DIAGNOSIS — Z4789 Encounter for other orthopedic aftercare: Secondary | ICD-10-CM | POA: Diagnosis not present

## 2020-04-19 DIAGNOSIS — S2242XD Multiple fractures of ribs, left side, subsequent encounter for fracture with routine healing: Secondary | ICD-10-CM | POA: Diagnosis not present

## 2020-04-19 DIAGNOSIS — Z79891 Long term (current) use of opiate analgesic: Secondary | ICD-10-CM | POA: Diagnosis not present

## 2020-04-19 DIAGNOSIS — E785 Hyperlipidemia, unspecified: Secondary | ICD-10-CM | POA: Diagnosis not present

## 2020-04-19 DIAGNOSIS — M48061 Spinal stenosis, lumbar region without neurogenic claudication: Secondary | ICD-10-CM | POA: Diagnosis not present

## 2020-04-19 DIAGNOSIS — Z79899 Other long term (current) drug therapy: Secondary | ICD-10-CM | POA: Diagnosis not present

## 2020-04-19 DIAGNOSIS — E559 Vitamin D deficiency, unspecified: Secondary | ICD-10-CM | POA: Diagnosis not present

## 2020-04-19 DIAGNOSIS — I10 Essential (primary) hypertension: Secondary | ICD-10-CM | POA: Diagnosis not present

## 2020-04-19 DIAGNOSIS — M5136 Other intervertebral disc degeneration, lumbar region: Secondary | ICD-10-CM | POA: Diagnosis not present

## 2020-04-25 DIAGNOSIS — M6281 Muscle weakness (generalized): Secondary | ICD-10-CM | POA: Diagnosis not present

## 2020-04-25 DIAGNOSIS — R2689 Other abnormalities of gait and mobility: Secondary | ICD-10-CM | POA: Diagnosis not present

## 2020-04-25 DIAGNOSIS — M48062 Spinal stenosis, lumbar region with neurogenic claudication: Secondary | ICD-10-CM | POA: Diagnosis not present

## 2020-04-28 DIAGNOSIS — M720 Palmar fascial fibromatosis [Dupuytren]: Secondary | ICD-10-CM | POA: Diagnosis not present

## 2020-05-01 DIAGNOSIS — M720 Palmar fascial fibromatosis [Dupuytren]: Secondary | ICD-10-CM | POA: Diagnosis not present

## 2020-05-02 DIAGNOSIS — M25641 Stiffness of right hand, not elsewhere classified: Secondary | ICD-10-CM | POA: Diagnosis not present

## 2020-05-02 DIAGNOSIS — Z23 Encounter for immunization: Secondary | ICD-10-CM | POA: Diagnosis not present

## 2020-05-02 DIAGNOSIS — M720 Palmar fascial fibromatosis [Dupuytren]: Secondary | ICD-10-CM | POA: Diagnosis not present

## 2020-05-03 DIAGNOSIS — K219 Gastro-esophageal reflux disease without esophagitis: Secondary | ICD-10-CM | POA: Diagnosis not present

## 2020-05-03 DIAGNOSIS — K2289 Other specified disease of esophagus: Secondary | ICD-10-CM | POA: Diagnosis not present

## 2020-05-04 DIAGNOSIS — S86011A Strain of right Achilles tendon, initial encounter: Secondary | ICD-10-CM | POA: Diagnosis not present

## 2020-05-10 DIAGNOSIS — H35712 Central serous chorioretinopathy, left eye: Secondary | ICD-10-CM | POA: Diagnosis not present

## 2020-05-16 DIAGNOSIS — Z1152 Encounter for screening for COVID-19: Secondary | ICD-10-CM | POA: Diagnosis not present

## 2020-05-23 DIAGNOSIS — Z86010 Personal history of colonic polyps: Secondary | ICD-10-CM | POA: Diagnosis not present

## 2020-05-23 DIAGNOSIS — Z720 Tobacco use: Secondary | ICD-10-CM | POA: Diagnosis not present

## 2020-05-23 DIAGNOSIS — K644 Residual hemorrhoidal skin tags: Secondary | ICD-10-CM | POA: Diagnosis not present

## 2020-05-23 DIAGNOSIS — Z8501 Personal history of malignant neoplasm of esophagus: Secondary | ICD-10-CM | POA: Diagnosis not present

## 2020-05-23 DIAGNOSIS — Z1211 Encounter for screening for malignant neoplasm of colon: Secondary | ICD-10-CM | POA: Diagnosis not present

## 2020-05-23 DIAGNOSIS — Z09 Encounter for follow-up examination after completed treatment for conditions other than malignant neoplasm: Secondary | ICD-10-CM | POA: Diagnosis not present

## 2020-05-23 DIAGNOSIS — I10 Essential (primary) hypertension: Secondary | ICD-10-CM | POA: Diagnosis not present

## 2020-05-24 DIAGNOSIS — M48062 Spinal stenosis, lumbar region with neurogenic claudication: Secondary | ICD-10-CM | POA: Diagnosis not present

## 2020-05-24 DIAGNOSIS — R2689 Other abnormalities of gait and mobility: Secondary | ICD-10-CM | POA: Diagnosis not present

## 2020-05-24 DIAGNOSIS — M6281 Muscle weakness (generalized): Secondary | ICD-10-CM | POA: Diagnosis not present

## 2020-05-29 DIAGNOSIS — R2689 Other abnormalities of gait and mobility: Secondary | ICD-10-CM | POA: Diagnosis not present

## 2020-05-29 DIAGNOSIS — M6281 Muscle weakness (generalized): Secondary | ICD-10-CM | POA: Diagnosis not present

## 2020-05-29 DIAGNOSIS — M48062 Spinal stenosis, lumbar region with neurogenic claudication: Secondary | ICD-10-CM | POA: Diagnosis not present

## 2020-06-01 DIAGNOSIS — R2689 Other abnormalities of gait and mobility: Secondary | ICD-10-CM | POA: Diagnosis not present

## 2020-06-01 DIAGNOSIS — M6281 Muscle weakness (generalized): Secondary | ICD-10-CM | POA: Diagnosis not present

## 2020-06-01 DIAGNOSIS — M48062 Spinal stenosis, lumbar region with neurogenic claudication: Secondary | ICD-10-CM | POA: Diagnosis not present

## 2020-06-02 DIAGNOSIS — E785 Hyperlipidemia, unspecified: Secondary | ICD-10-CM | POA: Diagnosis not present

## 2020-06-02 DIAGNOSIS — G894 Chronic pain syndrome: Secondary | ICD-10-CM | POA: Diagnosis not present

## 2020-06-02 DIAGNOSIS — M109 Gout, unspecified: Secondary | ICD-10-CM | POA: Diagnosis not present

## 2020-06-02 DIAGNOSIS — I129 Hypertensive chronic kidney disease with stage 1 through stage 4 chronic kidney disease, or unspecified chronic kidney disease: Secondary | ICD-10-CM | POA: Diagnosis not present

## 2020-06-02 DIAGNOSIS — N183 Chronic kidney disease, stage 3 unspecified: Secondary | ICD-10-CM | POA: Diagnosis not present

## 2020-06-05 DIAGNOSIS — S86011A Strain of right Achilles tendon, initial encounter: Secondary | ICD-10-CM | POA: Diagnosis not present

## 2020-06-06 DIAGNOSIS — M48062 Spinal stenosis, lumbar region with neurogenic claudication: Secondary | ICD-10-CM | POA: Diagnosis not present

## 2020-06-06 DIAGNOSIS — M6281 Muscle weakness (generalized): Secondary | ICD-10-CM | POA: Diagnosis not present

## 2020-06-06 DIAGNOSIS — R2689 Other abnormalities of gait and mobility: Secondary | ICD-10-CM | POA: Diagnosis not present

## 2020-06-08 DIAGNOSIS — M48062 Spinal stenosis, lumbar region with neurogenic claudication: Secondary | ICD-10-CM | POA: Diagnosis not present

## 2020-06-08 DIAGNOSIS — R2689 Other abnormalities of gait and mobility: Secondary | ICD-10-CM | POA: Diagnosis not present

## 2020-06-08 DIAGNOSIS — M6281 Muscle weakness (generalized): Secondary | ICD-10-CM | POA: Diagnosis not present

## 2020-06-12 DIAGNOSIS — R2689 Other abnormalities of gait and mobility: Secondary | ICD-10-CM | POA: Diagnosis not present

## 2020-06-12 DIAGNOSIS — M6281 Muscle weakness (generalized): Secondary | ICD-10-CM | POA: Diagnosis not present

## 2020-06-12 DIAGNOSIS — M48062 Spinal stenosis, lumbar region with neurogenic claudication: Secondary | ICD-10-CM | POA: Diagnosis not present

## 2020-06-20 DIAGNOSIS — M48062 Spinal stenosis, lumbar region with neurogenic claudication: Secondary | ICD-10-CM | POA: Diagnosis not present

## 2020-06-20 DIAGNOSIS — M6281 Muscle weakness (generalized): Secondary | ICD-10-CM | POA: Diagnosis not present

## 2020-06-20 DIAGNOSIS — R2689 Other abnormalities of gait and mobility: Secondary | ICD-10-CM | POA: Diagnosis not present

## 2020-07-20 DIAGNOSIS — G9520 Unspecified cord compression: Secondary | ICD-10-CM | POA: Insufficient documentation

## 2020-07-20 DIAGNOSIS — Z981 Arthrodesis status: Secondary | ICD-10-CM | POA: Insufficient documentation

## 2020-07-27 ENCOUNTER — Encounter: Payer: Self-pay | Admitting: *Deleted

## 2020-07-27 NOTE — PMR Pre-admission (Signed)
PMR Admission Coordinator Pre-Admission Assessment  Patient: Randall Mccormick is an 65 y.o., male MRN: [id number] DOB: 23-Jul-1955 Height:   Weight:    Insurance Information HMO:     PPO:      PCP:      IPA:      80/20:      OTHER:  PRIMARY: Medicare a and b      Policy#: [id number]      Subscriber: pt Benefits:  Phone #: passport one source     Name: 07/27/20 Eff. Date: 07/25/2017     Deduct: $1556      Out of Pocket Max: none CIR: 100%      SNF: 20 full days Outpatient: 80%     Co-Pay: 20% Home Health: 100%      Co-Pay: none DME: 80%  Co-Pay: 20% Providers: pt choice  SECONDARY: [employer]      Policy#: [id number]  Financial Counselor:       Phone#:   The "Data Collection Information Summary" for patients in Inpatient Rehabilitation Facilities with attached "Privacy Act Statement-Health Care Records" was provided and verbally reviewed with: Patient  Emergency Contact Information Contact Information    Name Relation Home Work Mobile   [name], [name] [phone]  [phone]      Current Medical History  Patient Admitting Diagnosis: Thoracic myelopathy  History of Present Illness: 65 year old man with past medical history for MDD, HTN and 4 prior lumbar surgeries with the most recent one 12/12/20  T10 to pelvis with L5/S1 TLIF and MRI incompatible thoracic spinal cord stimulator. Presented from [facility] on 07/19/2020 after a fall at home in the shower with acute onset low back pain and bilateral lower extremity pain. Denied any saddle anesthesia, bowel or bladder incontinence. He does report several weeks of persistent and progressive bilateral lower extremity weakness and numbness for about 2 months. Both legs are numb from the hips down.     CT myelogram showed spinal stenosis from T8 to 10. He had an acute exam decline and Dr. Christene Lye performed on 07/19/20  extension of fusion to T8 and T8 to T10 laminectomies with midline closure with bilateral paraspinal flaps and  stimulator removal site with complex closure.  Case was uncomplicated. On pre op exam he had 4+/5 strength in BLE except dorsi and plantarflexion which were approximately 4/5 strength. He had reduced sensation approximately 50% below the waist. No movement in BLE postoperatively, but began moving BLE  that evening. Admitted postop NICU for MAP goals and Riluzole treatment. Dilaudid PCA postoperatively and transitioned to Oxycodone, APAP and Gabapentin. Began Lovenox for DVT prophylaxis. Figure of 8 brace. Bowel regimen of Miralax, Senokot, MOM and dulcolax suppository. Foley removed with I and O caths q 6 hrs and flomax added.   Home meds of Amlodipine, Lisinopril, Lasix and Ezetimibe. BLE +1-2 pitting edema. RUE edema likely from IV infiltration improving. History of gout with allopurinol added.   Patient's medical record from [facility] has been reviewed by the rehabilitation admission coordinator and physician.  Past Medical History  Past Medical History:  Diagnosis Date  . Anxiety   . Cobalamin deficiency   . Degeneration of lumbar or lumbosacral intervertebral disc   . Depression   . Gouty arthropathy, unspecified   . Hemorrhoids   . Hyperlipidemia   . Hypertension   . Multiple fractures of ribs of left side 04/20/13   FALL FROM A LADDER ON   . Vitamin D deficiency  Family History   family history includes Cancer in his father, mother, and son.  Prior Rehab/Hospitalizations Has the patient had prior rehab or hospitalizations prior to admission? Yes   [facility] after surgery 6/21 for 2 weeks  Has the patient had major surgery during 100 days prior to admission? Yes   Current Medications  Current Outpatient Medications:  .  allopurinol (ZYLOPRIM) 100 MG tablet, Take 300 mg by mouth daily., Disp: , Rfl:  .  B-12, Methylcobalamin, 1000 MCG SUBL, Place 1 tablet under the tongue., Disp: , Rfl:  .  celecoxib (CELEBREX) 200 MG capsule, Take 200 mg by mouth daily. May  take one or two daily, Disp: , Rfl:  .  cholecalciferol (VITAMIN D) 1000 UNITS tablet, Take 1,000 Units by mouth daily. TAKE 2,000 UNITS A DAY, Disp: , Rfl:  .  HYDROcodone-acetaminophen (NORCO/VICODIN) 5-325 MG per tablet, Take 1 tablet by mouth every 6 (six) hours as needed for pain., Disp: , Rfl:  .  lisinopril (PRINIVIL,ZESTRIL) 40 MG tablet, Take 40 mg by mouth daily., Disp: , Rfl:  .  Multiple Vitamin (MULTIVITAMIN) capsule, Take 1 capsule by mouth daily., Disp: , Rfl:  .  mupirocin ointment (BACTROBAN) 2 %, , Disp: , Rfl:  .  oxyCODONE (OXY IR/ROXICODONE) 5 MG immediate release tablet, Take 1 tablet (5 mg total) by mouth every 4 (four) hours as needed for pain., Disp: 60 tablet, Rfl: 0 .  phenylephrine-shark liver oil-mineral oil-petrolatum (PREPARATION H) 0.25-3-14-71.9 % rectal ointment, Place rectally 2 (two) times daily as needed for hemorrhoids., Disp: , Rfl:  .  pravastatin (PRAVACHOL) 40 MG tablet, Take 40 mg by mouth daily., Disp: , Rfl:  .  starch (ANUSOL) 51 % suppository, Place 1 suppository rectally as needed for pain., Disp: , Rfl:  .  venlafaxine (EFFEXOR) 75 MG tablet, Take 75 mg by mouth 2 (two) times daily., Disp: , Rfl:   Patients Current Diet: Diet Regular, Ensure added due to poor appetite  Precautions / Restrictions Precautions: Back; Fall Precautions/Special Needs: Hip/Back Weight Bearing Restrictions: No   Has the patient had 2 or more falls or a fall with injury in the past year? Yes  Prior Activity Level Limited Community (1-2x/wk): progressive weakness over past 2 months; now using RW again   Prior Functional Level Self Care: Did the patient need help bathing, dressing, using the toilet or eating? Independent  Indoor Mobility: Did the patient need assistance with walking from room to room (with or without device)? Independent  Stairs: Did the patient need assistance with internal or external stairs (with or without device)? Independent  Functional  Cognition: Did the patient need help planning regular tasks such as shopping or remembering to take medications? [place] / Licensed conveyancer (specify quad or straight); Walker (specify type)   Prior Device Use: Indicate devices/aids used by the patient prior to current illness, exacerbation or injury? Walker; decline in function over past 2 months; had progressed without AD from cane, then returned to RW pta   Prior Functional Level Current Functional Level  Bed Mobility  Independent  side lying to sit via log roll   Transfers  Independent   transfer form EOB to recliner with use of Sara + lift, worked on standing balance, standing tolerance and weight bearing through BUE/LE posture. Max assist   Mobility - Walk/Wheelchair  Mod Independent   not attempted   Upper Body Dressing  Independent   supervision upper body   Lower Body Dressing  Independent   total assist   Grooming  Independent   supervision   Eating/Drinking  Independent   supervision   Toilet Transfer  Independent   max assist   Bladder Continence   continent  foley removed; I and O cath   Bowel Management  continent  constipation and incontinent since surgery; bowel program initiated   Stair Climbing  Mod Independent   not attempted   Communication  independent  independent   Memory  intact intact     Special Needs/ Care Considerations Designated visitor wife, [name]  Previous Home Environment  Living Arrangements: Spouse/significant other  Lives With: Spouse Available Help at Discharge: Family; Available 24 hours/day (wife retired) Type of Home: House Home Layout: One level Home Access: Stairs to enter Entrance Stairs-Rails: Right Entrance Stairs-Number of Steps: 4 steps through [facility] Shower/Tub: Multimedia programmer: Standard Bathroom Accessibility: Yes How Accessible: Accessible via [place]: No  Discharge Living  Setting Plans for Discharge Living Setting: Patient's home; Lives with (comment) (spouse) Type of Home at Discharge: House Discharge [place]: One level Discharge Home Access: Stairs to enter Entrance Stairs-Rails: Right Entrance Stairs-Number of Steps: 4 Discharge Bathroom Shower/Tub: Walk-in shower Discharge Bathroom Toilet: Standard Discharge Bathroom Accessibility: Yes How Accessible: Accessible via walker Does the patient have any problems obtaining your medications?: No  Social/Family/Support Systems Patient Roles: Spouse; Parent Contact Information: wife Anticipated Caregiver: wife Anticipated Caregiver's Contact Information: home [phone]; cell [phone] Ability/Limitations of Caregiver: no limitations; retired Careers adviser: 24/7 Discharge Plan Discussed with Primary Caregiver: Yes Is Caregiver In Agreement with Plan?: Yes Does Caregiver/Family have Issues with Lodging/Transportation while Pt is in Rehab?: No  Goals Patient/Family Goal for Rehab: supervision to min assist with PT and OT Expected length of stay: ELOS 2 to 3 weeks Pt/Family Agrees to Admission and willing to participate: Yes Program Orientation Provided & Reviewed with Pt/Caregiver Including Roles  & Responsibilities: Yes  Decrease burden of Care through IP rehab admission: n/a  Possible need for SNF placement upon discharge: not anticipated  Patient Condition: I have reviewed medical records from [facility], spoken with CSW, and patient and spouse. I discussed via phone for inpatient rehabilitation assessment.  Patient will benefit from ongoing PT and OT, can actively participate in 3 hours of therapy a day 5 days of the week, and can make measurable gains during the admission.  Patient will also benefit from the coordinated team approach during an Inpatient Acute Rehabilitation admission.  The patient will receive intensive therapy as well as Rehabilitation physician,  nursing, social worker, and care management interventions.  Due to bladder management, bowel management, safety, skin/wound care, disease management, medication administration, pain management and patient education the patient requires 24 hour a day rehabilitation nursing.  The patient is currently mod to max assist overall with mobility and basic ADLs.  Discharge setting and therapy post discharge at home with home health is anticipated.  Patient has agreed to participate in the Acute Inpatient Rehabilitation Program and will admit today.  Preadmission Screen Completed By:  Cleatrice Burke, 07/28/2020 10:51 AM ______________________________________________________________________   Discussed status with Dr. Posey Pronto  on 07/28/2020 at 1051 and received approval for admission today.  Admission Coordinator:  Cleatrice Burke, RN, time  1051 Date  07/28/2020   Assessment/Plan: Diagnosis: Thoracic myelopathy  1. Does the need for close, 24 hr/day Medical supervision in concert with the patient's rehab needs make it unreasonable for this patient to be served in a  less intensive setting? Yes  2. Co-Morbidities requiring supervision/potential complications: MDD, HTN, 4 prior lumbar surgeries, chronic pain syndrome 3. Due to bladder management, bowel management, safety, skin/wound care, disease management, pain management and patient education, does the patient require 24 hr/day rehab nursing? Yes 4. Does the patient require coordinated care of a physician, rehab nurse, PT, OT to address physical and functional deficits in the context of the above medical diagnosis(es)? Yes Addressing deficits in the following areas: balance, endurance, locomotion, strength, transferring, bowel/bladder control, bathing, dressing, toileting and psychosocial support 5. Can the patient actively participate in an intensive therapy program of at least 3 hrs of therapy 5 days a week? Yes 6. The potential for patient to make  measurable gains while on inpatient rehab is excellent 7. Anticipated functional outcomes upon discharge from inpatient rehab: supervision and min assist PT, supervision and min assist OT, n/a SLP 8. Estimated rehab length of stay to reach the above functional goals is: 12-16 days. 9. Anticipated discharge destination: Home 10. Overall Rehab/Functional Prognosis: good  MD Signature Maryla Morrow, MD, ABPMR

## 2020-07-28 ENCOUNTER — Other Ambulatory Visit: Payer: Self-pay

## 2020-07-28 ENCOUNTER — Encounter: Payer: Self-pay | Admitting: Physical Medicine and Rehabilitation

## 2020-07-28 ENCOUNTER — Encounter (HOSPITAL_COMMUNITY): Payer: Self-pay | Admitting: Physical Medicine and Rehabilitation

## 2020-07-28 ENCOUNTER — Other Ambulatory Visit: Payer: Self-pay | Admitting: Physical Medicine and Rehabilitation

## 2020-07-28 ENCOUNTER — Inpatient Hospital Stay (HOSPITAL_COMMUNITY)
Admission: RE | Admit: 2020-07-28 | Discharge: 2020-09-01 | DRG: 560 | Disposition: A | Discharge status: Home Health | Payer: MEDICARE | Source: Other Acute Inpatient Hospital | Attending: Physical Medicine and Rehabilitation | Admitting: Physical Medicine and Rehabilitation

## 2020-07-28 ENCOUNTER — Inpatient Hospital Stay (HOSPITAL_COMMUNITY): Payer: MEDICARE

## 2020-07-28 DIAGNOSIS — Z23 Encounter for immunization: Secondary | ICD-10-CM

## 2020-07-28 DIAGNOSIS — E559 Vitamin D deficiency, unspecified: Secondary | ICD-10-CM | POA: Diagnosis present

## 2020-07-28 DIAGNOSIS — F419 Anxiety disorder, unspecified: Secondary | ICD-10-CM | POA: Diagnosis present

## 2020-07-28 DIAGNOSIS — N319 Neuromuscular dysfunction of bladder, unspecified: Secondary | ICD-10-CM

## 2020-07-28 DIAGNOSIS — R296 Repeated falls: Secondary | ICD-10-CM | POA: Diagnosis present

## 2020-07-28 DIAGNOSIS — M4804 Spinal stenosis, thoracic region: Secondary | ICD-10-CM | POA: Diagnosis present

## 2020-07-28 DIAGNOSIS — Z981 Arthrodesis status: Secondary | ICD-10-CM

## 2020-07-28 DIAGNOSIS — E8809 Other disorders of plasma-protein metabolism, not elsewhere classified: Secondary | ICD-10-CM | POA: Diagnosis present

## 2020-07-28 DIAGNOSIS — I10 Essential (primary) hypertension: Secondary | ICD-10-CM | POA: Diagnosis present

## 2020-07-28 DIAGNOSIS — Z87891 Personal history of nicotine dependence: Secondary | ICD-10-CM | POA: Diagnosis not present

## 2020-07-28 DIAGNOSIS — G894 Chronic pain syndrome: Secondary | ICD-10-CM | POA: Diagnosis present

## 2020-07-28 DIAGNOSIS — G8222 Paraplegia, incomplete: Secondary | ICD-10-CM | POA: Diagnosis present

## 2020-07-28 DIAGNOSIS — M109 Gout, unspecified: Secondary | ICD-10-CM | POA: Diagnosis present

## 2020-07-28 DIAGNOSIS — M5104 Intervertebral disc disorders with myelopathy, thoracic region: Secondary | ICD-10-CM

## 2020-07-28 DIAGNOSIS — E785 Hyperlipidemia, unspecified: Secondary | ICD-10-CM | POA: Diagnosis present

## 2020-07-28 DIAGNOSIS — K592 Neurogenic bowel, not elsewhere classified: Secondary | ICD-10-CM

## 2020-07-28 DIAGNOSIS — E669 Obesity, unspecified: Secondary | ICD-10-CM | POA: Diagnosis present

## 2020-07-28 DIAGNOSIS — K5900 Constipation, unspecified: Secondary | ICD-10-CM

## 2020-07-28 DIAGNOSIS — N390 Urinary tract infection, site not specified: Secondary | ICD-10-CM | POA: Diagnosis present

## 2020-07-28 DIAGNOSIS — B9620 Unspecified Escherichia coli [E. coli] as the cause of diseases classified elsewhere: Secondary | ICD-10-CM | POA: Diagnosis present

## 2020-07-28 DIAGNOSIS — M792 Neuralgia and neuritis, unspecified: Secondary | ICD-10-CM

## 2020-07-28 DIAGNOSIS — Z806 Family history of leukemia: Secondary | ICD-10-CM | POA: Diagnosis not present

## 2020-07-28 DIAGNOSIS — G992 Myelopathy in diseases classified elsewhere: Secondary | ICD-10-CM | POA: Diagnosis present

## 2020-07-28 DIAGNOSIS — M62838 Other muscle spasm: Secondary | ICD-10-CM | POA: Diagnosis present

## 2020-07-28 DIAGNOSIS — Z8249 Family history of ischemic heart disease and other diseases of the circulatory system: Secondary | ICD-10-CM | POA: Diagnosis not present

## 2020-07-28 DIAGNOSIS — Z6834 Body mass index (BMI) 34.0-34.9, adult: Secondary | ICD-10-CM

## 2020-07-28 DIAGNOSIS — Z4789 Encounter for other orthopedic aftercare: Principal | ICD-10-CM

## 2020-07-28 DIAGNOSIS — R7989 Other specified abnormal findings of blood chemistry: Secondary | ICD-10-CM | POA: Diagnosis present

## 2020-07-28 DIAGNOSIS — R7401 Elevation of levels of liver transaminase levels: Secondary | ICD-10-CM | POA: Diagnosis not present

## 2020-07-28 DIAGNOSIS — R21 Rash and other nonspecific skin eruption: Secondary | ICD-10-CM | POA: Diagnosis not present

## 2020-07-28 DIAGNOSIS — D72829 Elevated white blood cell count, unspecified: Secondary | ICD-10-CM | POA: Diagnosis not present

## 2020-07-28 DIAGNOSIS — E46 Unspecified protein-calorie malnutrition: Secondary | ICD-10-CM | POA: Diagnosis present

## 2020-07-28 DIAGNOSIS — R609 Edema, unspecified: Secondary | ICD-10-CM | POA: Diagnosis not present

## 2020-07-28 HISTORY — DX: Intervertebral disc disorders with myelopathy, thoracic region: M51.04

## 2020-07-28 LAB — URINALYSIS, ROUTINE W REFLEX MICROSCOPIC
Bilirubin Urine: NEGATIVE
Glucose, UA: NEGATIVE mg/dL
Hgb urine dipstick: NEGATIVE
Ketones, ur: NEGATIVE mg/dL
Leukocytes,Ua: NEGATIVE
Nitrite: NEGATIVE
Protein, ur: NEGATIVE mg/dL
Specific Gravity, Urine: 1.009 (ref 1.005–1.030)
pH: 6 (ref 5.0–8.0)

## 2020-07-28 MED ORDER — FLEET ENEMA 7-19 GM/118ML RE ENEM: 1.0000 | ENEMA | Freq: Once | RECTAL | Status: DC | PRN

## 2020-07-28 MED ORDER — ENOXAPARIN SODIUM 30 MG/0.3ML ~~LOC~~ SOLN: 30.0000 mg | Freq: Two times a day (BID) | SUBCUTANEOUS | Status: DC

## 2020-07-28 MED ORDER — POLYETHYLENE GLYCOL 3350 17 G PO PACK
17.0000 g | PACK | Freq: Every day | ORAL | Status: DC
  Administered 2020-07-30 – 2020-08-30 (×23): 17 g via ORAL
  Filled 2020-07-28 (×33): qty 1

## 2020-07-28 MED ORDER — EZETIMIBE 10 MG PO TABS
10.0000 mg | ORAL_TABLET | Freq: Every day | ORAL | Status: DC
  Administered 2020-07-29 – 2020-09-01 (×35): 10 mg via ORAL
  Filled 2020-07-28 (×35): qty 1

## 2020-07-28 MED ORDER — LIDOCAINE HCL URETHRAL/MUCOSAL 2 % EX GEL
CUTANEOUS | Status: DC | PRN
  Filled 2020-07-28: qty 11

## 2020-07-28 MED ORDER — PROCHLORPERAZINE 25 MG RE SUPP
12.5000 mg | Freq: Four times a day (QID) | RECTAL | Status: DC | PRN
Start: 2020-07-28 — End: 2020-09-01

## 2020-07-28 MED ORDER — RILUZOLE 50 MG PO TABS
50.0000 mg | ORAL_TABLET | Freq: Two times a day (BID) | ORAL | Status: AC
  Administered 2020-07-28 – 2020-08-02 (×11): 50 mg via ORAL
  Filled 2020-07-28 (×12): qty 1

## 2020-07-28 MED ORDER — MELATONIN 3 MG PO TABS: 3.0000 mg | ORAL_TABLET | Freq: Every evening | ORAL | Status: DC | PRN

## 2020-07-28 MED ORDER — BISACODYL 10 MG RE SUPP
10.0000 mg | Freq: Every day | RECTAL | Status: DC
  Administered 2020-07-28 – 2020-08-28 (×11): 10 mg via RECTAL
  Filled 2020-07-28 (×26): qty 1

## 2020-07-28 MED ORDER — TRAZODONE HCL 50 MG PO TABS
50.0000 mg | ORAL_TABLET | Freq: Every day | ORAL | Status: DC
  Administered 2020-07-28 – 2020-08-31 (×35): 50 mg via ORAL
  Filled 2020-07-28 (×35): qty 1

## 2020-07-28 MED ORDER — PROCHLORPERAZINE MALEATE 5 MG PO TABS: 5.0000 mg | ORAL_TABLET | Freq: Four times a day (QID) | ORAL | Status: DC | PRN

## 2020-07-28 MED ORDER — NON FORMULARY: 50.0000 mg | Freq: Two times a day (BID) | Status: DC

## 2020-07-28 MED ORDER — OLANZAPINE 10 MG PO TABS
20.0000 mg | ORAL_TABLET | Freq: Every day | ORAL | Status: DC
  Administered 2020-07-28 – 2020-08-31 (×35): 20 mg via ORAL
  Filled 2020-07-28 (×35): qty 2

## 2020-07-28 MED ORDER — GUAIFENESIN-DM 100-10 MG/5ML PO SYRP: 5.0000 mL | ORAL_SOLUTION | Freq: Four times a day (QID) | ORAL | Status: DC | PRN

## 2020-07-28 MED ORDER — ACETAMINOPHEN 325 MG PO TABS
325.0000 mg | ORAL_TABLET | ORAL | Status: DC | PRN
  Administered 2020-07-31 – 2020-08-15 (×13): 650 mg via ORAL
  Filled 2020-07-28 (×13): qty 2

## 2020-07-28 MED ORDER — BISACODYL 10 MG RE SUPP
10.0000 mg | Freq: Every day | RECTAL | Status: DC | PRN
Start: 2020-07-28 — End: 2020-09-01
  Administered 2020-08-20 – 2020-08-21 (×2): 10 mg via RECTAL

## 2020-07-28 MED ORDER — GABAPENTIN 600 MG PO TABS
600.0000 mg | ORAL_TABLET | Freq: Three times a day (TID) | ORAL | Status: DC
  Administered 2020-07-28 – 2020-09-01 (×104): 600 mg via ORAL
  Filled 2020-07-28 (×105): qty 1

## 2020-07-28 MED ORDER — TRAMADOL HCL 50 MG PO TABS
50.0000 mg | ORAL_TABLET | Freq: Four times a day (QID) | ORAL | Status: DC | PRN
  Administered 2020-07-31 – 2020-08-09 (×4): 50 mg via ORAL
  Filled 2020-07-28 (×5): qty 1

## 2020-07-28 MED ORDER — METHOCARBAMOL 500 MG PO TABS
500.0000 mg | ORAL_TABLET | Freq: Four times a day (QID) | ORAL | Status: DC | PRN
  Administered 2020-07-31 – 2020-08-30 (×41): 500 mg via ORAL
  Filled 2020-07-28 (×42): qty 1

## 2020-07-28 MED ORDER — OXYCODONE HCL 5 MG PO TABS
5.0000 mg | ORAL_TABLET | ORAL | Status: DC | PRN
  Administered 2020-07-28: 5 mg via ORAL
  Administered 2020-07-29 – 2020-08-01 (×8): 10 mg via ORAL
  Administered 2020-08-01: 5 mg via ORAL
  Administered 2020-08-01 – 2020-08-09 (×20): 10 mg via ORAL
  Filled 2020-07-28 (×11): qty 2
  Filled 2020-07-28: qty 1
  Filled 2020-07-28 (×6): qty 2
  Filled 2020-07-28: qty 1
  Filled 2020-07-28 (×12): qty 2

## 2020-07-28 MED ORDER — ALUM & MAG HYDROXIDE-SIMETH 200-200-20 MG/5ML PO SUSP: 30.0000 mL | ORAL | Status: DC | PRN

## 2020-07-28 MED ORDER — TAMSULOSIN HCL 0.4 MG PO CAPS
0.4000 mg | ORAL_CAPSULE | Freq: Every day | ORAL | Status: DC
  Administered 2020-07-28 – 2020-08-06 (×10): 0.4 mg via ORAL
  Filled 2020-07-28 (×11): qty 1

## 2020-07-28 MED ORDER — HEPARIN SODIUM (PORCINE) 5000 UNIT/ML IJ SOLN: 5000.0000 [IU] | Freq: Three times a day (TID) | INTRAMUSCULAR | Status: DC

## 2020-07-28 MED ORDER — PROCHLORPERAZINE EDISYLATE 10 MG/2ML IJ SOLN: 5.0000 mg | Freq: Four times a day (QID) | INTRAMUSCULAR | Status: DC | PRN

## 2020-07-28 MED ORDER — AMLODIPINE BESYLATE 5 MG PO TABS
5.0000 mg | ORAL_TABLET | Freq: Every day | ORAL | Status: DC
  Administered 2020-07-29 – 2020-09-01 (×35): 5 mg via ORAL
  Filled 2020-07-28 (×35): qty 1

## 2020-07-28 MED ORDER — SENNOSIDES-DOCUSATE SODIUM 8.6-50 MG PO TABS: 1.0000 | ORAL_TABLET | Freq: Every day | ORAL | Status: DC

## 2020-07-28 MED ORDER — FLUOXETINE HCL 20 MG PO CAPS
40.0000 mg | ORAL_CAPSULE | Freq: Every day | ORAL | Status: DC
  Administered 2020-07-29 – 2020-07-31 (×3): 40 mg via ORAL
  Filled 2020-07-28 (×3): qty 2

## 2020-07-28 MED ORDER — DIPHENHYDRAMINE HCL 12.5 MG/5ML PO ELIX
12.5000 mg | ORAL_SOLUTION | Freq: Four times a day (QID) | ORAL | Status: DC | PRN
  Administered 2020-07-30: 12.5 mg via ORAL
  Filled 2020-07-28: qty 10

## 2020-07-28 MED ORDER — ALLOPURINOL 100 MG PO TABS
100.0000 mg | ORAL_TABLET | Freq: Every day | ORAL | Status: DC
  Administered 2020-07-29 – 2020-09-01 (×35): 100 mg via ORAL
  Filled 2020-07-28 (×35): qty 1

## 2020-07-28 MED ORDER — POLYETHYLENE GLYCOL 3350 17 G PO PACK
17.0000 g | PACK | Freq: Every day | ORAL | Status: DC | PRN
  Administered 2020-08-22: 17 g via ORAL
  Filled 2020-07-28: qty 1

## 2020-07-28 MED ORDER — ENOXAPARIN SODIUM 40 MG/0.4ML ~~LOC~~ SOLN
40.0000 mg | Freq: Two times a day (BID) | SUBCUTANEOUS | Status: DC
  Administered 2020-07-29 – 2020-08-28 (×61): 40 mg via SUBCUTANEOUS
  Filled 2020-07-28 (×61): qty 0.4

## 2020-07-28 MED ORDER — METHOCARBAMOL 500 MG PO TABS: 1000.0000 mg | ORAL_TABLET | Freq: Three times a day (TID) | ORAL | Status: DC

## 2020-07-28 NOTE — Progress Notes (Signed)
Patient admitted to 47m04, A&Ox4 no complaints of pain, vital signs stable. Wife and patient made aware of visitor policy and fall policy.

## 2020-07-28 NOTE — Progress Notes (Signed)
Cristina Gong, RN  Rehab Admission Coordinator  Physical Medicine and Rehabilitation  PMR Pre-admission  Signed  Encounter Date:  07/27/2020      Related encounter: Documentation from 07/27/2020 in [facility]          Show:Clear all [x] Manual[x] Template[] Copied  Added by: [x] Cristina Gong, RN[x] Jamse Arn, MD   [] Hover for details  PMR Admission Coordinator Pre-Admission Assessment   Patient: Randall Mccormick is an 65 y.o., male MRN: [id number] DOB: 1955-11-04 Height:   Weight:     Insurance Information HMO:     PPO:      PCP:      IPA:      80/20:      OTHER:  PRIMARY: Medicare a and b      Policy#: [id number]      Subscriber: pt Benefits:  Phone #: passport one source     Name: 07/27/20 Eff. Date: 07/25/2017     Deduct: $1556      Out of Pocket Max: none CIR: 100%      SNF: 20 full days Outpatient: 80%     Co-Pay: 20% Home Health: 100%      Co-Pay: none DME: 80%  Co-Pay: 20% Providers: pt choice  SECONDARY: [employer]      Policy#: [id number]   Financial Counselor:       Phone#:    The "Data Collection Information Summary" for patients in Inpatient Rehabilitation Facilities with attached "Privacy Act [place] [facility] Records" was provided and verbally reviewed with: Patient   Emergency Contact Information         Contact Information     Name Relation Home Work Mobile    Jermal, Dismuke [phone]   [phone]         Current Medical History  Patient Admitting Diagnosis: Thoracic myelopathy   History of Present Illness: 65 year old man with past medical history for MDD, HTN and 4 prior lumbar surgeries with the most recent one 12/12/20  T10 to pelvis with L5/S1 TLIF and MRI incompatible thoracic spinal cord stimulator. Presented from [facility] on 07/19/2020 after a fall at home in the shower with acute onset low back pain and bilateral lower extremity pain. Denied any saddle  anesthesia, bowel or bladder incontinence. He does report several weeks of persistent and progressive bilateral lower extremity weakness and numbness for about 2 months. Both legs are numb from the hips down.      CT myelogram showed spinal stenosis from T8 to 10. He had an acute exam decline and Dr. Laren Everts performed on 07/19/20  extension of fusion to T8 and T8 to T10 laminectomies with midline closure with bilateral paraspinal flaps and stimulator removal site with complex closure.  Case was uncomplicated. On pre op exam he had 4+/5 strength in BLE except dorsi and plantarflexion which were approximately 4/5 strength. He had reduced sensation approximately 50% below the waist. No movement in BLE postoperatively, but began moving BLE  that evening. Admitted postop NICU for MAP goals and Riluzole treatment. Dilaudid PCA postoperatively and transitioned to Oxycodone, APAP and Gabapentin. Began Lovenox for DVT prophylaxis. Figure of 8 brace. Bowel regimen of Miralax, Senokot, MOM and dulcolax suppository. Foley removed with I and O caths q 6 hrs and flomax added.    Home meds of Amlodipine, Lisinopril, Lasix and Ezetimibe. BLE +1-2 pitting edema. RUE edema likely from IV infiltration improving. History of gout with allopurinol added.  Patient[facility] has been reviewed by the rehabilitation admission coordinator and physician.   Past Medical History      Past Medical History:  Diagnosis Date  . Anxiety    . Cobalamin deficiency    . Degeneration of lumbar or lumbosacral intervertebral disc    . Depression    . Gouty arthropathy, unspecified    . Hemorrhoids    . Hyperlipidemia    . Hypertension    . Multiple fractures of ribs of left side 04/20/13    FALL FROM A LADDER ON   . Vitamin D deficiency        Family History   family history includes Cancer in his father, mother, and son.   Prior Rehab/Hospitalizations Has the patient had prior rehab or  hospitalizations prior to admission? Yes    [facility] after surgery 6/21 for 2 weeks   Has the patient had major surgery during 100 days prior to admission? Yes              Current Medications   Current Outpatient Medications:  .  allopurinol (ZYLOPRIM) 100 MG tablet, Take 300 mg by mouth daily., Disp: , Rfl:  .  B-12, Methylcobalamin, 1000 MCG SUBL, Place 1 tablet under the tongue., Disp: , Rfl:  .  celecoxib (CELEBREX) 200 MG capsule, Take 200 mg by mouth daily. May take one or two daily, Disp: , Rfl:  .  cholecalciferol (VITAMIN D) 1000 UNITS tablet, Take 1,000 Units by mouth daily. TAKE 2,000 UNITS A DAY, Disp: , Rfl:  .  HYDROcodone-acetaminophen (NORCO/VICODIN) 5-325 MG per tablet, Take 1 tablet by mouth every 6 (six) hours as needed for pain., Disp: , Rfl:  .  lisinopril (PRINIVIL,ZESTRIL) 40 MG tablet, Take 40 mg by mouth daily., Disp: , Rfl:  .  Multiple Vitamin (MULTIVITAMIN) capsule, Take 1 capsule by mouth daily., Disp: , Rfl:  .  mupirocin ointment (BACTROBAN) 2 %, , Disp: , Rfl:  .  oxyCODONE (OXY IR/ROXICODONE) 5 MG immediate release tablet, Take 1 tablet (5 mg total) by mouth every 4 (four) hours as needed for pain., Disp: 60 tablet, Rfl: 0 .  phenylephrine-shark liver oil-mineral oil-petrolatum (PREPARATION H) 0.25-3-14-71.9 % rectal ointment, Place rectally 2 (two) times daily as needed for hemorrhoids., Disp: , Rfl:  .  pravastatin (PRAVACHOL) 40 MG tablet, Take 40 mg by mouth daily., Disp: , Rfl:  .  starch (ANUSOL) 51 % suppository, Place 1 suppository rectally as needed for pain., Disp: , Rfl:  .  venlafaxine (EFFEXOR) 75 MG tablet, Take 75 mg by mouth 2 (two) times daily., Disp: , Rfl:    Patients Current Diet: Diet Regular, Ensure added due to poor appetite   Precautions / Restrictions Precautions: Back; Fall Precautions/Special Needs: Hip/Back Weight Bearing Restrictions: No   Has the patient had 2 or more falls or a fall with injury in the past year? Yes    Prior Activity Level Limited Community (1-2x/wk): progressive weakness over past 2 months; now using RW again     Prior Functional Level Self Care: Did the patient need help bathing, dressing, using the toilet or eating? Independent   Indoor Mobility: Did the patient need assistance with walking from room to room (with or without device)? Independent   Stairs: Did the patient need assistance with internal or external stairs (with or without device)? Independent   Functional Cognition: Did the patient need help planning regular tasks such as shopping or remembering to take  medications? [place] / Licensed conveyancer (specify quad or straight); Walker (specify type)     Prior Device Use: Indicate devices/aids used by the patient prior to current illness, exacerbation or injury? Walker; decline in function over past 2 months; had progressed without AD from cane, then returned to RW pta     Prior Functional Level Current Functional Level  Bed Mobility   Independent  side lying to sit via log roll    Transfers   Independent    transfer form EOB to recliner with use of Sara + lift, worked on standing balance, standing tolerance and weight bearing through BUE/LE posture. Max assist    Mobility - Walk/Wheelchair   Mod Independent    not attempted    Upper Body Dressing   Independent    supervision upper body    Lower Body Dressing   Independent    total assist    Grooming   Independent    supervision    Eating/Drinking   Independent    supervision    Toilet Transfer   Independent    max assist    Bladder Continence    continent   foley removed; I and O cath    Bowel Management   continent   constipation and incontinent since surgery; bowel program initiated    Stair Climbing   Mod Independent    not attempted    Communication   independent   independent    Memory   intact intact      Special Needs/ Care Considerations Designated  visitor wife, [name]   Previous Home Environment  Living Arrangements: Spouse/significant other  Lives With: Spouse Available Help at Discharge: Family; Available 24 hours/day (wife retired) Type of Home: House Home Layout: One level Home Access: Stairs to enter Entrance Stairs-Rails: Right Entrance Stairs-Number of Steps: 4 steps through [facility] Shower/Tub: Multimedia programmer: Standard Bathroom Accessibility: Yes How Accessible: Accessible via [place]: No   Discharge Living Setting Plans for Discharge Living Setting: Patient's home; Lives with (comment) (spouse) Type of Home at Discharge: House Discharge [place]: One level Discharge Home Access: Stairs to enter Entrance Stairs-Rails: Right Entrance Stairs-Number of Steps: 4 Discharge Bathroom Shower/Tub: Walk-in shower Discharge Bathroom Toilet: Standard Discharge Bathroom Accessibility: Yes How Accessible: Accessible via walker Does the patient have any problems obtaining your medications?: No   Social/Family/Support Systems Patient Roles: Spouse; Parent Contact Information: wife Anticipated Caregiver: wife Anticipated Caregiver's Contact Information: home [phone]; cell [phone] Ability/Limitations of Caregiver: no limitations; retired Careers adviser: 24/7 Discharge Plan Discussed with Primary Caregiver: Yes Is Caregiver In Agreement with Plan?: Yes Does Caregiver/Family have Issues with Lodging/Transportation while Pt is in Rehab?: No   Goals Patient/Family Goal for Rehab: supervision to min assist with PT and OT Expected length of stay: ELOS 2 to 3 weeks Pt/Family Agrees to Admission and willing to participate: Yes Program Orientation Provided & Reviewed with Pt/Caregiver Including Roles  & Responsibilities: Yes   Decrease burden of Care through IP rehab admission: n/a   Possible need for SNF placement upon discharge: not anticipated   Patient Condition:  I have reviewed medical records from [facility], spoken with CSW, and patient and spouse. I discussed via phone for inpatient rehabilitation assessment.  Patient will benefit from ongoing PT and OT, can actively participate in 3 hours of therapy a day 5 days of the week, and can make measurable gains during the admission.  Patient will  also benefit from the coordinated team approach during an Inpatient Acute Rehabilitation admission.  The patient will receive intensive therapy as well as Rehabilitation physician, nursing, social worker, and care management interventions.  Due to bladder management, bowel management, safety, skin/wound care, disease management, medication administration, pain management and patient education the patient requires 24 hour a day rehabilitation nursing.  The patient is currently mod to max assist overall with mobility and basic ADLs.  Discharge setting and therapy post discharge at home with home health is anticipated.  Patient has agreed to participate in the Acute Inpatient Rehabilitation Program and will admit today.   Preadmission Screen Completed By:  Clois Dupes, 07/28/2020 10:51 AM ______________________________________________________________________   Discussed status with Dr. Allena Katz  on 07/28/2020 at 1051 and received approval for admission today.   Admission Coordinator:  Clois Dupes, RN, time  1051 Date  07/28/2020    Assessment/Plan: Diagnosis: Thoracic myelopathy   1. Does the need for close, 24 hr/day Medical supervision in concert with the patient's rehab needs make it unreasonable for this patient to be served in a less intensive setting? Yes  2. Co-Morbidities requiring supervision/potential complications: MDD, HTN, 4 prior lumbar surgeries, chronic pain syndrome 3. Due to bladder management, bowel management, safety, skin/wound care, disease management, pain management and patient education, does the patient require 24 hr/day  rehab nursing? Yes 4. Does the patient require coordinated care of a physician, rehab nurse, PT, OT to address physical and functional deficits in the context of the above medical diagnosis(es)? Yes Addressing deficits in the following areas: balance, endurance, locomotion, strength, transferring, bowel/bladder control, bathing, dressing, toileting and psychosocial support 5. Can the patient actively participate in an intensive therapy program of at least 3 hrs of therapy 5 days a week? Yes 6. The potential for patient to make measurable gains while on inpatient rehab is excellent 7. Anticipated functional outcomes upon discharge from inpatient rehab: supervision and min assist PT, supervision and min assist OT, n/a SLP 8. Estimated rehab length of stay to reach the above functional goals is: 12-16 days. 9. Anticipated discharge destination: Home 10. Overall Rehab/Functional Prognosis: good   MD Signature Maryla Morrow, MD, ABPMR        Cosigned by: Marcello Fennel, MD at 07/28/2020 11:08 AM    Revision History                             Note Details  Author Standley Brooking, RN File Time 07/28/2020 11:02 AM  Author Type Rehab Admission Coordinator Status Signed  Last Editor Marcello Fennel, MD Service Physical Medicine and Rehabilitation  Hospital Acct # [id number] Admit Date 07/28/2020

## 2020-07-28 NOTE — H&P (Signed)
Physical Medicine and Rehabilitation Admission H&P    CC: Functional deficits due to thoracic myelopathy.   HPI:  Randall Mccormick is a 65 year old male with history of HTN, Vitamin D deficiency, gouty arthritis, anxiety d/o, DDD with multiple back surgeries, LBP with weakness s/p spinal cord stimulator and multiple lumbar surgery--last T10- pelvis decompressive fusion on 11/23/19 with [facility] CIR stay, multiple falls since d/c to home with progressive weakness and BLE numbness from hips down.  He started declining around October and had to start using his walker again. History from chart review and patient.  He was admitted to [facility] on 07/19/2020 after a fall in the shower, with reported incontinence.  Work-up revealed severe flattening of the thecal sac with severe segmental spinal stenosis from T8-T10 and epidural thickening with fibrosis/disc bulge. He underwent posterior thoracic fusion with laminectomy and extension of arthrodesis to T8.    Hospital course complicated by postop bilateral lower extremity paraplegia as well as hypotension treated with Riluzole, fluid bolus, plasma as well as pressors to Keep MAPs > 85. He started having improvement in BLE movement on 07/13/2020 and therapy has been ongoing. Bowel program augmented to manage constipation and wound VAC discontinued 02/03. He has had difficulty voiding requiring I/O caths after which condom cath was placed to help manage "incontinence".  On Riluzole for SCI-->to complete 13-day course on 02/09. Patient with resultant bilateral lower extremity weakness with sensory deficits, decreased endurance, has poor awareness/lacks insight into deficits as well as pain affecting ADLs and mobility. CIR recommended due to functional decline.  Please see preadmission assessment from earlier today as well.   Review of Systems  Constitutional: Positive for malaise/fatigue. Negative for fever.  HENT: Negative for hearing loss.   Eyes: Negative for blurred  vision and double vision.  Respiratory: Negative for cough and shortness of breath.   Cardiovascular: Negative for chest pain and palpitations.  Gastrointestinal: Positive for constipation. Negative for abdominal pain and nausea.  Genitourinary: Positive for frequency and urgency.  Musculoskeletal: Positive for back pain, falls (past couple of months) and myalgias.  Skin: Negative for rash.  Neurological: Positive for sensory change (numb from hips down ), focal weakness and weakness. Negative for dizziness and speech change.  Psychiatric/Behavioral: The patient is not nervous/anxious.   All other systems reviewed and are negative.     Past Medical History:  Diagnosis Date  . Anxiety   . Cobalamin deficiency   . Degeneration of lumbar or lumbosacral intervertebral disc   . Depression   . Gouty arthropathy, unspecified   . Hemorrhoids   . Hyperlipidemia   . Hypertension   . Multiple fractures of ribs of left side 04/20/13   FALL FROM A LADDER ON   . Vitamin D deficiency     Past Surgical History:  Procedure Laterality Date  . APPENDECTOMY    . CHOLECYSTECTOMY  2010   [facility]  . TONSILLECTOMY      Family History  Problem Relation Age of Onset  . Cancer Mother        BREAST  . Heart disease Father   . Alcoholism Father   . 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 Son passed away from leukemia at age 52 and has a daughter in [place]. He reports that he has never smoked. His smokeless tobacco use includes chew--quit a 11/2019. He reports current alcohol use--6 beers and one  shot of liquor daily. He does not use illicit drugs.     Allergies: No Known Allergies    Medications Prior to Admission  Medication Sig Dispense Refill  . allopurinol (ZYLOPRIM) 100 MG tablet Take 300 mg by mouth daily.    . B-12, Methylcobalamin, 1000 MCG SUBL Place 1 tablet under the tongue.    . celecoxib (CELEBREX) 200 MG capsule Take  200 mg by mouth daily. May take one or two daily    . cholecalciferol (VITAMIN D) 1000 UNITS tablet Take 1,000 Units by mouth daily. TAKE 2,000 UNITS A DAY    . HYDROcodone-acetaminophen (NORCO/VICODIN) 5-325 MG per tablet Take 1 tablet by mouth every 6 (six) hours as needed for pain.    Marland Kitchen lisinopril (PRINIVIL,ZESTRIL) 40 MG tablet Take 40 mg by mouth daily.    . Multiple Vitamin (MULTIVITAMIN) capsule Take 1 capsule by mouth daily.    . mupirocin ointment (BACTROBAN) 2 %     . oxyCODONE (OXY IR/ROXICODONE) 5 MG immediate release tablet Take 1 tablet (5 mg total) by mouth every 4 (four) hours as needed for pain. 60 tablet 0  . phenylephrine-shark liver oil-mineral oil-petrolatum (PREPARATION H) 0.25-3-14-71.9 % rectal ointment Place rectally 2 (two) times daily as needed for hemorrhoids.    . pravastatin (PRAVACHOL) 40 MG tablet Take 40 mg by mouth daily.    Marland Kitchen starch (ANUSOL) 51 % suppository Place 1 suppository rectally as needed for pain.    Marland Kitchen venlafaxine (EFFEXOR) 75 MG tablet Take 75 mg by mouth 2 (two) times daily.      Drug Regimen Review  Drug regimen was reviewed and remains appropriate with no significant issues identified  Home: One level home with 4 STE.    Functional History: Was independent with walker PTA.   Functional Status:  Mobility: Max assist with Clarise Cruz Lift for sit to stand transfers Working on standing balance and WB BLE/posture in lift   ADL: Supervision for UB bathing.  Total assist with dressing.   Cognition:    Physical Exam: There were no vitals taken for this visit. Physical Exam Vitals reviewed.  Constitutional:      General: He is not in acute distress.    Appearance: He is obese. He is not ill-appearing.  HENT:     Head: Normocephalic and atraumatic.     Right Ear: External ear normal.     Left Ear: External ear normal.     Nose: Nose normal.  Eyes:     General:        Right eye: No discharge.        Left eye: No discharge.      Extraocular Movements: Extraocular movements intact.  Cardiovascular:     Rate and Rhythm: Normal rate and regular rhythm.  Pulmonary:     Effort: Pulmonary effort is normal. No respiratory distress.     Breath sounds: No stridor.  Abdominal:     General: Abdomen is flat. Bowel sounds are normal. There is distension.  Musculoskeletal:     Cervical back: Normal range of motion and neck supple.     Comments: No edema or tenderness in extremities  Skin:    General: Skin is warm and dry.     Comments: Upper back incision intact and healing well. rectangular discoloration around the incision (contact dermatitis?) and small rectangular patch of erythema around prior drain site.  Bilateral heels boggy.  Neurological:     Mental Status: He is alert.     Comments:  Alert Motor: Bilateral: 5/5 right lower extremity: Hip flexion, knee extension 2+/5, ankle dorsiflexion 4-/5 Left lower extremity: Hip flexion, knee extension 2/5, ankle dorsiflexion 4 -/5 Sensation diminished to light touch bilateral lower extremities  Psychiatric:        Mood and Affect: Mood normal.        Behavior: Behavior normal.        Thought Content: Thought content normal.     No results found for this or any previous visit (from the past 48 hour(s)). No results found.     Medical Problem List and Plan: 1.  Bilateral lower extremity weakness with sensory deficits, decreased endurance, poor awareness/lacks insight as well as pain affecting ADLs and mobility secondary to thoracic myelopathy with paraparesis.  -patient may shower  -ELOS/Goals:-15 days/supervision/Min A   Admit to CIR 2.  Antithrombotics: -DVT/anticoagulation:  Pharmaceutical: Lovenox --will order dopplers for surveillance.   -antiplatelet therapy: N/a 3. Pain Management: Continue oxycodone prn.   --neuropathy BLE and right chest wall-->continue gabapentin 600 mg tid for now.   Monitor with increase exertion 4. H/o anxiety/Mood: LCSW to follow for  evaluation and support.   Prozaac  -antipsychotic agents: On Zyprexa 5. Neuropsych: This patient is capable of making decisions on his own behalf. 6. Skin/Wound Care: Routine pressure relief measures. Hypoallergenic sheets.  7. Fluids/Electrolytes/Nutrition: Monitor I/O.  CMP ordered 8.Neurogenic bladder: Overflowing at this time--cathed for 450 cc past admission.  Check UA/UCS (has been cathed 3-4 times at [facility])   --Toilet every 3 hours. Monitor voiding with PVR checks/bladder scan.    --Urgency for past few months.  Flomax  Increase bladder meds as necessary 9. HTN: Monitor BP tid--continue Norvasc.   Monitor with increased mobility 10. Neurogenic bowel:  KUB ordered to check stool burden.    --Senna S/Miralax ordered but has been held -->received dose of Miralax today.   --Miralax daily and dulcolax supp w/dig stim after supper.   Bary Leriche, PA-C 07/28/2020  I have personally performed a face to face diagnostic evaluation, including, but not limited to relevant history and physical exam findings, of this patient and developed relevant assessment and plan.  Additionally, I have reviewed and concur with the physician assistant's documentation above.  Delice Lesch, MD, ABPMR

## 2020-07-28 NOTE — Progress Notes (Signed)
Inpatient Rehabilitation Medication Review by a Pharmacist  A complete drug regimen review was completed for this patient to identify any potential clinically significant medication issues.  Clinically significant medication issues were identified:  No.  Check AMION for pharmacist assigned to patient if future medication questions/issues arise during this admission.  Time spent performing this drug regimen review (minutes):  10 min.   Blenda Nicely 07/28/2020 8:03 PM

## 2020-07-28 NOTE — Progress Notes (Signed)
    Incision C/D/I with circumferal discoloration.

## 2020-07-29 ENCOUNTER — Inpatient Hospital Stay (HOSPITAL_COMMUNITY): Payer: MEDICARE

## 2020-07-29 DIAGNOSIS — R609 Edema, unspecified: Secondary | ICD-10-CM

## 2020-07-29 DIAGNOSIS — E8809 Other disorders of plasma-protein metabolism, not elsewhere classified: Secondary | ICD-10-CM

## 2020-07-29 DIAGNOSIS — M5104 Intervertebral disc disorders with myelopathy, thoracic region: Secondary | ICD-10-CM | POA: Diagnosis not present

## 2020-07-29 DIAGNOSIS — D72829 Elevated white blood cell count, unspecified: Secondary | ICD-10-CM

## 2020-07-29 DIAGNOSIS — G894 Chronic pain syndrome: Secondary | ICD-10-CM | POA: Diagnosis not present

## 2020-07-29 DIAGNOSIS — R7401 Elevation of levels of liver transaminase levels: Secondary | ICD-10-CM

## 2020-07-29 DIAGNOSIS — K592 Neurogenic bowel, not elsewhere classified: Secondary | ICD-10-CM | POA: Diagnosis not present

## 2020-07-29 DIAGNOSIS — N319 Neuromuscular dysfunction of bladder, unspecified: Secondary | ICD-10-CM | POA: Diagnosis not present

## 2020-07-29 DIAGNOSIS — E46 Unspecified protein-calorie malnutrition: Secondary | ICD-10-CM

## 2020-07-29 LAB — COMPREHENSIVE METABOLIC PANEL
ALT: 56 U/L — ABNORMAL HIGH (ref 0–44)
AST: 42 U/L — ABNORMAL HIGH (ref 15–41)
Albumin: 2.7 g/dL — ABNORMAL LOW (ref 3.5–5.0)
Alkaline Phosphatase: 101 U/L (ref 38–126)
Anion gap: 11 (ref 5–15)
BUN: 10 mg/dL (ref 8–23)
CO2: 24 mmol/L (ref 22–32)
Calcium: 8.8 mg/dL — ABNORMAL LOW (ref 8.9–10.3)
Chloride: 100 mmol/L (ref 98–111)
Creatinine, Ser: 0.78 mg/dL (ref 0.61–1.24)
GFR, Estimated: >60 mL/min (ref >60)
Glucose, Bld: 105 mg/dL — ABNORMAL HIGH (ref 70–99)
Potassium: 4 mmol/L (ref 3.5–5.1)
Sodium: 135 mmol/L (ref 135–145)
Total Bilirubin: 1 mg/dL (ref 0.3–1.2)
Total Protein: 5.9 g/dL — ABNORMAL LOW (ref 6.5–8.1)

## 2020-07-29 LAB — CBC WITH DIFFERENTIAL/PLATELET
Abs Immature Granulocytes: 0.23 10*3/uL — ABNORMAL HIGH (ref 0.00–0.07)
Basophils Absolute: 0.1 10*3/uL (ref 0.0–0.1)
Basophils Relative: 1 %
Eosinophils Absolute: 0.6 10*3/uL — ABNORMAL HIGH (ref 0.0–0.5)
Eosinophils Relative: 5 %
HCT: 35.7 % — ABNORMAL LOW (ref 39.0–52.0)
Hemoglobin: 11.8 g/dL — ABNORMAL LOW (ref 13.0–17.0)
Immature Granulocytes: 2 %
Lymphocytes Relative: 15 %
Lymphs Abs: 1.8 10*3/uL (ref 0.7–4.0)
MCH: 30.3 pg (ref 26.0–34.0)
MCHC: 33.1 g/dL (ref 30.0–36.0)
MCV: 91.5 fL (ref 80.0–100.0)
Monocytes Absolute: 1.3 10*3/uL — ABNORMAL HIGH (ref 0.1–1.0)
Monocytes Relative: 10 %
Neutro Abs: 8.2 10*3/uL — ABNORMAL HIGH (ref 1.7–7.7)
Neutrophils Relative %: 67 %
Platelets: 398 10*3/uL (ref 150–400)
RBC: 3.9 MIL/uL — ABNORMAL LOW (ref 4.22–5.81)
RDW: 15.5 % (ref 11.5–15.5)
WBC: 12.1 10*3/uL — ABNORMAL HIGH (ref 4.0–10.5)
nRBC: 0 % (ref 0.0–0.2)

## 2020-07-29 MED ORDER — PROSOURCE PLUS PO LIQD
30.0000 mL | Freq: Two times a day (BID) | ORAL | Status: DC
  Administered 2020-07-29 – 2020-09-01 (×66): 30 mL via ORAL
  Filled 2020-07-29 (×64): qty 30

## 2020-07-29 NOTE — Plan of Care (Signed)
  Problem: RH Balance Goal: LTG: Patient will maintain dynamic sitting balance (OT) Description: LTG:  Patient will maintain dynamic sitting balance with assistance during activities of daily living (OT) Flowsheets (Taken 07/29/2020 1225) LTG: Pt will maintain dynamic sitting balance during ADLs with: Minimal Assistance - Patient > 75%   Problem: RH Grooming Goal: LTG Patient will perform grooming w/assist,cues/equip (OT) Description: LTG: Patient will perform grooming with assist, with/without cues using equipment (OT) Flowsheets (Taken 07/29/2020 1225) LTG: Pt will perform grooming with assistance level of: Set up assist    Problem: RH Bathing Goal: LTG Patient will bathe all body parts with assist levels (OT) Description: LTG: Patient will bathe all body parts with assist levels (OT) Flowsheets (Taken 07/29/2020 1225) LTG: Pt will perform bathing with assistance level/cueing: Moderate Assistance - Patient 50 - 74%   Problem: RH Dressing Goal: LTG Patient will perform upper body dressing (OT) Description: LTG Patient will perform upper body dressing with assist, with/without cues (OT). Flowsheets (Taken 07/29/2020 1225) LTG: Pt will perform upper body dressing with assistance level of: Minimal Assistance - Patient > 75% Goal: LTG Patient will perform lower body dressing w/assist (OT) Description: LTG: Patient will perform lower body dressing with assist, with/without cues in positioning using equipment (OT) Flowsheets (Taken 07/29/2020 1225) LTG: Pt will perform lower body dressing with assistance level of: Moderate Assistance - Patient 50 - 74%   Problem: RH Toileting Goal: LTG Patient will perform toileting task (3/3 steps) with assistance level (OT) Description: LTG: Patient will perform toileting task (3/3 steps) with assistance level (OT)  Flowsheets (Taken 07/29/2020 1225) LTG: Pt will perform toileting task (3/3 steps) with assistance level: Moderate Assistance - Patient 50 - 74%    Problem: RH Toilet Transfers Goal: LTG Patient will perform toilet transfers w/assist (OT) Description: LTG: Patient will perform toilet transfers with assist, with/without cues using equipment (OT) Flowsheets (Taken 07/29/2020 1225) LTG: Pt will perform toilet transfers with assistance level of: Minimal Assistance - Patient > 75%

## 2020-07-29 NOTE — Progress Notes (Signed)
Physical Therapy Session Note  Patient Details  Name: Randall Mccormick MRN: [id number] Date of Birth: May 22, 1956  Today's Date: 07/29/2020 PT Individual Time: 1630-1700 PT Individual Time Calculation (min): 30 min   Short Term Goals: Week 1:  PT Short Term Goal 1 (Week 1): Pt will be max A for transfers with LRAD PT Short Term Goal 2 (Week 1): Pt will be able to maintain sitting balance with mod A for 5 min PT Short Term Goal 3 (Week 1): Pt will be able to propel wc 50 ft with supervision.  Skilled Therapeutic Interventions/Progress Updates:  Pt received semi-reclined in bed, agreeable to PT session. Pt reports 6/10 pain in mid-upper back region at rest that increases to 7/10 with exercise. Pt declines any intervention, reports being premedicated prior to start of therapy session and reports repositioning, heat, and ice have been unsuccessful in assisting with decreasing his pain. Session focus on BLE strengthening at bed level. Supine BLE strengthening therex 2 x 5 reps with AAROM needed at times: heel slides, hip abd, SAQ, SLR, hip flexion. Pt found to be incontinent of urine in brief. Pt is mod A for rolling R/L with use of bedrail and skilled cueing for dependent brief change and pericare. Education with patient regarding letting staff know if he is aware incontinence has occurred to prevent skin breakdown. Pt understanding of education. Pt left semi-reclined in bed with needs in reach, bed alarm in place at end of session.  Therapy Documentation Precautions:  Precautions Precautions: Back,Fall Precaution Comments: paraparesis Required Braces or Orthoses: Spinal Brace Spinal Brace: Other (comment) Spinal Brace Comments: Figure 8 Brace Restrictions Weight Bearing Restrictions: No   Therapy/Group: Individual Therapy   Excell Seltzer, PT, DPT  07/29/2020, 5:19 PM

## 2020-07-29 NOTE — Progress Notes (Signed)
[facility] PHYSICAL MEDICINE & REHABILITATION PROGRESS NOTE  Subjective/Complaints: Patient seen sitting up in bed this morning.  He states he slept well overnight.  He states he had a bowel movement.  He states he is ready to begin therapies.  ROS: Denies CP, SOB, N/V/D  Objective: Vital Signs: Blood pressure 124/67, pulse 91, temperature 98.1 F (36.7 C), temperature source Oral, resp. rate 20, SpO2 92 %. DG Abd 1 View  Result Date: 07/28/2020 CLINICAL DATA:  65 year old male with abdominal pain. EXAM: ABDOMEN - 1 VIEW COMPARISON:  Lumbar spine radiograph dated 07/19/2022. FINDINGS: No bowel dilatation or evidence of obstruction. No significant colonic stool burden. Faint radiopaque focus over the right renal silhouette may represent a kidney stone. Extensive lower thoracic and lumbar spine fixation hardware. The hardware is intact. The soft tissues are unremarkable. IMPRESSION: No bowel obstruction. Electronically Signed   By: Anner Crete M.D.   On: 07/28/2020 19:23   Recent Labs    07/29/20 0533  WBC 12.1*  HGB 11.8*  HCT 35.7*  PLT 398   Recent Labs    07/29/20 0533  NA 135  K 4.0  CL 100  CO2 24  GLUCOSE 105*  BUN 10  CREATININE 0.78  CALCIUM 8.8*    Intake/Output Summary (Last 24 hours) at 07/29/2020 0944 Last data filed at 07/29/2020 0856 Gross per 24 hour  Intake 240 ml  Output 450 ml  Net -210 ml        Physical Exam: BP 124/67 (BP Location: Right Arm)   Pulse 91   Temp 98.1 F (36.7 C) (Oral)   Resp 20   SpO2 92%  Constitutional: No distress . Vital signs reviewed.  Obese. HENT: Normocephalic.  Atraumatic. Eyes: EOMI. No discharge. Cardiovascular: No JVD.  RRR. Respiratory: Normal effort.  No stridor.  Bilateral clear to auscultation. GI: Distended.  BS +.  Soft. Skin: Warm and dry.   Back incision CDI Boggy heels Psych: Normal mood.  Normal behavior. Musc: No edema in extremities.  No tenderness in extremities. Neuro: Alert Motor:  Bilateral upper extremities: 5/5  Right lower extremity: Hip flexion, knee extension 2+/5, ankle dorsiflexion 4-/5 Left lower extremity: Hip flexion, knee extension 2/5, ankle dorsiflexion 4 -/5, unchanged Sensation diminished to light touch bilateral lower extremities   Assessment/Plan: 1. Functional deficits which require 3+ hours per day of interdisciplinary therapy in a comprehensive inpatient rehab setting.  Physiatrist is providing close team supervision and 24 hour management of active medical problems listed below.  Physiatrist and rehab team continue to assess barriers to discharge/monitor patient progress toward functional and medical goals   Care Tool:  Bathing              Bathing assist       Upper Body Dressing/Undressing Upper body dressing   What is the patient wearing?: Hospital gown only    Upper body assist      Lower Body Dressing/Undressing Lower body dressing      What is the patient wearing?: Incontinence brief     Lower body assist       Toileting Toileting    Toileting assist Assist for toileting: Moderate Assistance - Patient 50 - 74%     Transfers Chair/bed transfer  Transfers assist           Locomotion Ambulation   Ambulation assist              Walk 10 feet activity   Assist  Walk 50 feet activity   Assist           Walk 150 feet activity   Assist           Walk 10 feet on uneven surface  activity   Assist           Wheelchair     Assist               Wheelchair 50 feet with 2 turns activity    Assist            Wheelchair 150 feet activity     Assist           Medical Problem List and Plan: 1.  Bilateral lower extremity weakness with sensory deficits, decreased endurance, poor awareness/lacks insight as well as pain affecting ADLs and mobility secondary to thoracic myelopathy with paraparesis.  Begin CIR evaluations 2.   Antithrombotics: -DVT/anticoagulation:  Pharmaceutical: Lovenox    Lower extremity Dopplers ordered             -antiplatelet therapy: N/a 3. Pain Management: Continue oxycodone prn.              --neuropathy BLE and right chest wall-->continue gabapentin 600 mg tid for now.              Monitor with increased exertion 4. H/o anxiety/Mood: LCSW to follow for evaluation and support.              Prozaac             -antipsychotic agents: On Zyprexa 5. Neuropsych: This patient is capable of making decisions on his own behalf. 6. Skin/Wound Care: Routine pressure relief measures. Hypoallergenic sheets.   Prevalon boots for both heels ordered 7. Fluids/Electrolytes/Nutrition: Monitor I/Os.    8. Neurogenic bladder:   UA unremarkable, urine culture pending  PVRs  Flomax 9. HTN: Monitor BP   Norvasc             Monitor with increased mobility 10. Neurogenic bowel:    KUB reviewed, unremarkable             Miralax daily and dulcolax supp w/dig stim after supper.   Improving 11.  Hypoalbuminemia  Supplement initiated on 2/5 12.  Transaminitis  LFTs elevated on 2/5, repeat labs on Monday 13.  Leukocytosis  WBC 12.1 on 2/5, labs ordered for Monday  Afebrile, no signs/symptoms of infection  LOS: 1 days A FACE TO FACE EVALUATION WAS PERFORMED  Arhan Mcmanamon Lorie Phenix 07/29/2020, 9:44 AM

## 2020-07-29 NOTE — Evaluation (Signed)
Occupational Therapy Assessment and Plan  Patient Details  Name: Randall Mccormick MRN: [id number] Date of Birth: 08-Dec-1955  OT Diagnosis: abnormal posture, lumbago (low back pain), muscle weakness (generalized) and paraplegia Rehab Potential: Rehab Potential (ACUTE ONLY): Fair ELOS: 3-4 weeks   Today's Date: 07/29/2020 OT Individual Time: 0900-1000 and 1345-1425 OT Individual Time Calculation (min): 60 min and 40 min  Hospital Problem: Principal Problem:   Thoracic disc disease with myelopathy Active Problems:   Constipation   Neurogenic bowel   Neurogenic bladder   Neuropathic pain   Chronic pain syndrome   Leukocytosis   Transaminitis   Hypoalbuminemia due to protein-calorie malnutrition [facility])   Past Medical History:  Past Medical History:  Diagnosis Date  . Anxiety   . Cobalamin deficiency   . Degeneration of lumbar or lumbosacral intervertebral disc   . Depression   . Gouty arthropathy, unspecified   . Hemorrhoids   . Hyperlipidemia   . Hypertension   . Multiple fractures of ribs of left side 04/20/13   FALL FROM A LADDER ON   . Vitamin D deficiency    Past Surgical History:  Past Surgical History:  Procedure Laterality Date  . APPENDECTOMY    . CHOLECYSTECTOMY  2010   [facility]  . TONSILLECTOMY      Assessment & Plan Clinical Impression: Randall Mccormick is a 65 year old male with history of HTN, Vitamin D deficiency, gouty arthritis, anxiety d/o, DDD with multiple back surgeries, LBP with weakness s/p spinal cord stimulator and multiple lumbar surgery--last T10- pelvis decompressive fusion on 11/23/19 with [facility] CIR stay, multiple falls since d/c to home with progressive weakness and BLE numbness from hips down.  He started declining around October and had to start using his walker again. History from chart review and patient.  He was admitted to [facility] on 07/19/2020 after a fall in the shower, with reported incontinence.  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 He underwent posterior thoracic fusion with laminectomy and extension of arthrodesis to T8.    Hospital course complicated by postop bilateral lower extremity paraplegia as well as hypotension treated with Riluzole, fluid bolus, plasma as well as pressors to Keep MAPs > 85. He started having improvement in BLE movement on 07/13/2020 and therapy has been ongoing. Bowel program augmented to manage constipation and wound VAC discontinued 02/03. He has had difficulty voiding requiring I/O caths after which condom cath was placed to help manage "incontinence".  On Riluzole for SCI-->to complete 13-day course on 02/09. Patient with resultant bilateral lower extremity weakness with sensory deficits, decreased endurance, has poor awareness/lacks insight into deficits as well as pain affecting ADLs and mobility. CIR recommended due to functional decline.  Please see preadmission assessment from earlier today as well.   Patient currently requires total with basic self-care skills secondary to muscle weakness and muscle paralysis, decreased cardiorespiratoy endurance, decreased coordination and decreased sitting balance, decreased postural control and decreased balance strategies.  Prior to hospitalization, patient could complete BADLs with min- mod.  Patient will benefit from skilled intervention to increase independence with basic self-care skills prior to discharge home with wife.  Anticipate patient will require 24 hour supervision and moderate physical assestance and follow up home health.  OT - End of Session Endurance Deficit: Yes Endurance Deficit Description: Increased truncal fatigue when sitting EOB OT Assessment Rehab Potential (ACUTE ONLY): Fair OT Barriers to Discharge: [place] home environment;Lack  of/limited family support;Incontinence;Home environment access/layout OT Patient  demonstrates impairments in the following area(s): Balance;Safety;Sensory;Skin Integrity;Endurance;Motor;Pain OT Basic ADL's Functional Problem(s): Grooming;Bathing;Dressing;Toileting OT Advanced ADL's Functional Problem(s): Simple Meal Preparation OT Transfers Functional Problem(s): Toilet;Tub/Shower OT Additional Impairment(s): None OT Plan OT Intensity: Minimum of 1-2 x/day, 45 to 90 minutes OT Frequency: 5 out of 7 days OT Duration/Estimated Length of Stay: 3-4 weeks OT Treatment/Interventions: Balance/vestibular training;Discharge planning;Pain management;Self Care/advanced ADL retraining;UE/LE Coordination activities;Therapeutic Exercise;Patient/family education;Functional mobility training;Disease mangement/prevention;Community reintegration;DME/adaptive equipment instruction;Neuromuscular re-education;Psychosocial support;Splinting/orthotics;UE/LE Strength taining/ROM;Wheelchair propulsion/positioning OT Self Feeding Anticipated Outcome(s): No goal OT Basic Self-Care Anticipated Outcome(s): Min-Mod A OT Toileting Anticipated Outcome(s): Mod A OT Bathroom Transfers Anticipated Outcome(s): Min A OT Recommendation Recommendations for Other Services: Neuropsych consult Patient destination: Home Follow Up Recommendations: 24 hour supervision/assistance Equipment Recommended: To be determined   OT Evaluation Precautions/Restrictions  Precautions Precautions: Back;Fall Precaution Comments: paraplegia Required Braces or Orthoses: Spinal Brace Spinal Brace: Other (comment) Spinal Brace Comments: Figure 8 Brace Pain Pain Assessment Pain Score: 4  Home Living/Prior Functioning Home Living Family/patient expects to be discharged to:: Private residence Living Arrangements: Spouse/significant other Available Help at Discharge: Family,Available 24 hours/day Type of Home: House Home Access: Stairs to enter Entrance Stairs-Number of Steps: 4 Home Layout: One level Bathroom Shower/Tub:  Walk-in shower Bathroom Toilet: Standard Bathroom Accessibility: No Additional Comments: Bathroom is not walker accessible, pt reports leaving walker in front of the doorway when toileting or showering  Lives With: Spouse IADL History Homemaking Responsibilities: No (Pt reports since functional decline starting June last year (post previous back sx), his wife takes care of all IADL responsibilities) Occupation: Retired 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" Leisure and Hobbies: Hunting and fishing Prior Function Level of Independence: Needs assistance with ADLs Bath: Minimal Toileting: Minimal Dressing: Moderate Driving: No Vision Baseline Vision/History: Wears glasses Wears Glasses: Reading only Patient Visual Report: No change from baseline Perception  Perception: Within Functional Limits Praxis Praxis: Intact Cognition Overall Cognitive Status: Within Functional Limits for tasks assessed Arousal/Alertness: Awake/alert Orientation Level: Person;Place;Situation Person: Oriented Place: Oriented Situation: Oriented Year: 2021 Month: January Day of Week: Incorrect Immediate Memory Recall: Sock;Blue;Bed Memory Recall Sock: Without Cue Memory Recall Blue: Without Cue Memory Recall Bed: Without Cue Sensation Sensation Light Touch: Impaired Detail Light Touch Impaired Details: Impaired RLE;Impaired LLE Coordination Gross Motor Movements are Fluid and Coordinated: No Fine Motor Movements are Fluid and Coordinated: Yes Coordination and Movement Description: coordination affected by trunk control deficits + LE weakness Finger Nose Finger Test: WNL bilaterally Motor  Motor Motor: Abnormal postural alignment and control;Paraplegia  Trunk/Postural Assessment  Cervical Assessment Cervical Assessment: Exceptions to WFL (forward head) Thoracic Assessment Thoracic Assessment: Exceptions to WFL (n/a back precautions) Lumbar Assessment Lumbar Assessment: Exceptions  to WFL (n/a back precautions) Postural Control Postural Control: Deficits on evaluation (Lt anterior lean/LOBs during session)  Balance Balance Balance Assessed: Yes Dynamic Sitting Balance Dynamic Sitting - Balance Support: During functional activity Dynamic Sitting - Level of Assistance: 2: Max assist (UB dressing) Dynamic Sitting - Balance Activities: Lateral lean/weight shifting;Forward lean/weight shifting Dynamic Standing Balance Dynamic Standing - Balance Support:  (sit<stand not attempted) Extremity/Trunk Assessment RUE Assessment RUE Assessment: Within Functional Limits Active Range of Motion (AROM) Comments: WNL LUE Assessment LUE Assessment: Within Functional Limits Active Range of Motion (AROM) Comments: WNL  Care Tool Care Tool Self Care Eating    not assessed    Oral Care    Oral Care Assist Level: Set up assist    Bathing     Body parts bathed by patient: Right arm;Left arm;Chest;Abdomen;Face Body parts bathed by helper: Front perineal area;Buttocks;Right upper leg;Left upper leg;Right lower leg;Left lower leg   Assist Level: 2 Helpers (EOB + bedlevel)    Upper Body Dressing(including orthotics)       Assist Level: Maximal Assistance - Patient 25 - 49% (sitting EOB)    Lower Body Dressing (excluding footwear)   What is the patient wearing?: Incontinence brief;Pants Assist for lower body dressing: Total Assistance - Patient < 25%    Putting on/Taking off footwear     Assist for footwear: Dependent - Patient 0%       Care Tool Toileting Toileting activity   Assist for toileting: 2 Helpers        Toilet transfer Toilet transfer activity did not occur: Safety/medical concerns (not safe at this time due to trunk control deficits and B LE weakness)         Refer to Care Plan for Long Term Goals  SHORT TERM GOAL WEEK 1 OT Short Term Goal 1 (Week 1): Pt will complete BSC transfer with LRAD and 2 assist OT Short Term Goal 2 (Week 1): Pt will  complete UB dressing with Mod A OT Short Term Goal 3 (Week 1): Pt will complete 1 grooming task seated EOB with no more than Mod balance assistance  Recommendations for other services: Neuropsych   Skilled Therapeutic Intervention Skilled OT session completed with focus on initial evaluation, education on OT role/POC, and establishment of patient-centered goals.   Pt greeted in bed and premedicated for back pain. He was agreeable to completing bathing/dressing tasks EOB and bedlevel today. Max A for supine<sit and Mod-Max A for dynamic sitting balance while engaged in UB self care tasks, pt with Lt-anterior lean. He was able to state 3/3 back precautions, already wearing his figure 8 back brace. Max A for returning to bed and +2 for boosting up in bed and changing saturated brief pt logrolling Rt>Lt with Max A. Pt with some B LE movement but very difficult for pt to lift each LE against gravity to assist with LB tasks, able to tolerate figure 4 stretches bilaterally to hips. Total A-2 assist overall. Pt very pleasant and cooperative throughout session. Setup for oral care with [facility] raised. Left him with all needs within reach and bed alarm set.   2nd Session 1:1 tx (40 min) Pt greeted in the bed and premedicated for pain. He requested for bedlevel therapy due to fatigue. His spouse Sula Soda was present to observe. Guided pt through core exercises involving anterior reaches to target outside of base of support with bed placed in supine position. Pt reported no back strain during engagement. Next with HOB elevated, instructed pt in UE exercises using the 3# bar 15 reps 2 sets for UB strengthening. Pt remained in bed at close of session, all needs within reach and bed alarm set.  ADL ADL Eating: Not assessed Grooming: Setup Where Assessed-Grooming: Bed level Upper Body Bathing: Moderate assistance Where Assessed-Upper Body Bathing: Edge of bed (taking sitting balance into consideration) Lower Body  Bathing: Dependent (+2 assist) Where Assessed-Lower Body Bathing: Bed level Upper Body Dressing: Maximal assistance Where Assessed-Upper Body Dressing: Edge of bed Lower Body Dressing: Dependent Where Assessed-Lower Body Dressing: Bed level Toileting: Dependent (+2 assist) Toilet Transfer: Not assessed Tub/Shower Transfer: Not assessed ADL Comments: Toilet and shower transfers not safe to attempt due to pts trunk control deficits and B LE weakness    Discharge Criteria: Patient will be  discharged from OT if patient refuses treatment 3 consecutive times without medical reason, if treatment goals not met, if there is a change in medical status, if patient makes no progress towards goals or if patient is discharged from hospital.  The above assessment, treatment plan, treatment alternatives and goals were discussed and mutually agreed upon: by patient  Skeet Simmer 07/29/2020, 12:20 PM

## 2020-07-29 NOTE — Evaluation (Signed)
Physical Therapy Assessment and Plan  Patient Details  Name: Randall Mccormick MRN: [id number] Date of Birth: 1955/11/05  PT Diagnosis: Abnormal posture, Difficulty walking, Impaired sensation, Low back pain, Muscle weakness and Paraplegia Rehab Potential: Good ELOS: 25-28 days   Today's Date: 07/29/2020 PT Individual Time:  1050-1155 PT minutes- 65 min  Hospital Problem: Principal Problem:   Thoracic disc disease with myelopathy Active Problems:   Constipation   Neurogenic bowel   Neurogenic bladder   Neuropathic pain   Chronic pain syndrome   Leukocytosis   Transaminitis   Hypoalbuminemia due to protein-calorie malnutrition [facility])   Past Medical History:  Past Medical History:  Diagnosis Date  . Anxiety   . Cobalamin deficiency   . Degeneration of lumbar or lumbosacral intervertebral disc   . Depression   . Gouty arthropathy, unspecified   . Hemorrhoids   . Hyperlipidemia   . Hypertension   . Multiple fractures of ribs of left side 04/20/13   FALL FROM A LADDER ON   . Vitamin D deficiency    Past Surgical History:  Past Surgical History:  Procedure Laterality Date  . APPENDECTOMY    . CHOLECYSTECTOMY  2010   [facility]  . TONSILLECTOMY      Assessment & Plan Clinical Impression:  Randall Mccormick is a 65 year old male with history of HTN, Vitamin D deficiency, gouty arthritis, anxiety d/o, DDD with multiple back surgeries, LBP with weakness s/p spinal cord stimulator and multiple lumbar surgery--last T10- pelvis decompressive fusion on 11/23/19 with [place] CIR stay, multiple falls since d/c to home with progressive weakness and BLE numbness from hips down.  He started declining around October and had to start using his walker again. History from chart review and patient.  He was admitted to [facility] on 07/19/2020 after a fall in the shower, with reported incontinence.  Work-up revealed severe flattening of the thecal sac with severe segmental spinal stenosis from T8-T10 and  epidural thickening with fibrosis/disc bulge. He underwent posterior thoracic fusion with laminectomy and extension of arthrodesis to T8.    Hospital course complicated by postop bilateral lower extremity paraplegia as well as hypotension treated with Riluzole, fluid bolus, plasma as well as pressors to Keep MAPs > 85. He started having improvement in BLE movement on 07/13/2020 and therapy has been ongoing. Bowel program augmented to manage constipation and wound VAC discontinued 02/03. He has had difficulty voiding requiring I/O caths after which condom cath was placed to help manage "incontinence".  On Riluzole for SCI-->to complete 13-day course on 02/09. Patient with resultant bilateral lower extremity weakness with sensory deficits, decreased endurance, has poor awareness/lacks insight into deficits as well as pain affecting ADLs and mobility. CIR recommended due to functional decline. Patient transferred to CIR on 07/28/2020 .   Patient currently requires total with mobility secondary to muscle weakness and muscle paralysis, decreased cardiorespiratoy endurance, abnormal tone, unbalanced muscle activation and decreased coordination and decreased sitting balance and decreased postural control.  Prior to hospitalization, patient was modified independent  with mobility and lived with Spouse in a House home.  Home access is 4Stairs to enter.  Patient will benefit from skilled PT intervention to maximize safe functional mobility, minimize fall risk and decrease caregiver burden for planned discharge home with 24 hour assist.  Anticipate patient will benefit from follow up [facility] at discharge.  PT - End of Session Activity Tolerance: Tolerates 30+ min activity with multiple rests Endurance Deficit: Yes Endurance Deficit Description: Frequent rest breaks during  therapy session PT Assessment Rehab Potential (ACUTE/IP ONLY): Good PT Barriers to Discharge: Inaccessible home environment;Home environment  access/layout;Incontinence;Neurogenic Bowel & Bladder;Wound Care PT Plan PT Intensity: Minimum of 1-2 x/day ,45 to 90 minutes PT Frequency: 5 out of 7 days PT Duration Estimated Length of Stay: 25-28 days PT Treatment/Interventions: Ambulation/gait training;Balance/vestibular training;Community reintegration;Discharge planning;DME/adaptive equipment instruction;Neuromuscular re-education;Functional electrical stimulation;Pain management;Functional mobility training;Patient/family education;Psychosocial support;Therapeutic Activities;Therapeutic Exercise;UE/LE Strength taining/ROM;UE/LE Coordination activities;Wheelchair propulsion/positioning;Stair training;Splinting/orthotics PT Recommendation Recommendations for Other Services: Neuropsych consult;Therapeutic Recreation consult Therapeutic Recreation Interventions: Stress management Follow Up Recommendations: Home health PT;24 hour supervision/assistance Patient destination: Home Equipment Recommended: Wheelchair cushion (measurements);Wheelchair (measurements) Equipment Details: TBD pending progress   PT Evaluation Precautions/Restrictions Precautions Precautions: Back;Fall Precaution Comments: paraparesis Required Braces or Orthoses: Spinal Brace Spinal Brace: Other (comment) Spinal Brace Comments: Figure 8 Brace General   Vital Signs  Pain   Home Living/Prior Functioning Home Living Available Help at Discharge: Family;Available 24 hours/day Type of Home: House Home Access: Stairs to enter [employer] of Steps: 4 Entrance Stairs-Rails: Right Home Layout: One level  Lives With: Spouse Prior Function Level of Independence: Independent with transfers;Independent with gait;Requires assistive device for independence  Able to Take Stairs?: Yes Driving: No Vocation: Retired Radiographer, therapeutic - History Baseline Vision: Wears glasses only for reading Perception Perception: Within Functional  Limits Praxis Praxis: Intact  Cognition Overall Cognitive Status: Within Functional Limits for tasks assessed Arousal/Alertness: Awake/alert Attention: Focused;Sustained Focused Attention: Appears intact Sustained Attention: Appears intact Memory: Appears intact Awareness: Appears intact Problem Solving: Appears intact Safety/Judgment: Appears intact Sensation Sensation Light Touch: Impaired Detail Light Touch Impaired Details: Impaired RLE;Impaired LLE Proprioception: Impaired Detail Proprioception Impaired Details: Impaired RLE;Absent LLE Coordination Gross Motor Movements are Fluid and Coordinated: No Coordination and Movement Description: coordination affected by trunk control deficits + LE weakness Motor  Motor Motor: Paraplegia;Abnormal postural alignment and control;Abnormal tone Motor - Skilled Clinical Observations: paraparesis   Trunk/Postural Assessment  Cervical Assessment Cervical Assessment: Exceptions to [facility] Thoracic Assessment Thoracic Assessment: Exceptions to [facility] (back precautions) Lumbar Assessment Lumbar Assessment: Exceptions to [facility] (back precautions; PPT) Postural Control Postural Control: Deficits on evaluation Trunk Control: Heavy posterior lean in sitting  Balance Balance Balance Assessed: Yes Static Sitting Balance Static Sitting - Balance Support: Bilateral upper extremity supported;Feet supported Static Sitting - Level of Assistance: 2: Max assist Static Sitting - Comment/# of Minutes: with heavy posterior lean Extremity Assessment      RLE Assessment RLE Assessment: Exceptions to [facility] General Strength Comments: Impaired, see below RLE Strength Right Hip Flexion: 1/5 Right Knee Flexion: 2+/5 Right Knee Extension: 2+/5 Right Ankle Dorsiflexion: 2/5 Right Ankle Plantar Flexion: 3+/5 LLE Assessment LLE Assessment: Exceptions to [facility] General Strength Comments: Impaired see below LLE Strength Left Hip Flexion: 1/5 Left Knee Flexion:  2/5 Left Knee Extension: 3+/5 Left Ankle Dorsiflexion: 3+/5 Left Ankle Plantar Flexion: 4-/5  Care Tool Care Tool Bed Mobility Roll left and right activity   Roll left and right assist level: Moderate Assistance - Patient 50 - 74%    Sit to lying activity   Sit to lying assist level: Maximal Assistance - Patient 25 - 49%    Lying to sitting edge of bed activity   Lying to sitting edge of bed assist level: Maximal Assistance - Patient 25 - 49%     Care Tool Transfers Sit to stand transfer   Sit to stand assist level: Dependent - Patient 0%    Chair/bed transfer   Chair/bed transfer assist level: Dependent - Patient  0%     Psychologist, counselling transfer activity did not occur: Safety/medical concerns        Care Tool Locomotion Ambulation Ambulation activity did not occur: Safety/medical concerns        Walk 10 feet activity Walk 10 feet activity did not occur: Safety/medical concerns       Walk 50 feet with 2 turns activity Walk 50 feet with 2 turns activity did not occur: Safety/medical concerns      Walk 150 feet activity Walk 150 feet activity did not occur: Safety/medical concerns      Walk 10 feet on uneven surfaces activity Walk 10 feet on uneven surfaces activity did not occur: Safety/medical concerns      Stairs Stair activity did not occur: Safety/medical concerns        Walk up/down 1 step activity Walk up/down 1 step or curb (drop down) activity did not occur: Safety/medical concerns     Walk up/down 4 steps activity did not occuR: Safety/medical concerns  Walk up/down 4 steps activity      Walk up/down 12 steps activity Walk up/down 12 steps activity did not occur: Safety/medical concerns      Pick up small objects from floor Pick up small object from the floor (from standing position) activity did not occur: Safety/medical concerns      Wheelchair Will patient use wheelchair at discharge?: Yes Type of Wheelchair: Manual    Wheelchair assist level: Minimal Assistance - Patient > 75% Max wheelchair distance: 50 ft  Wheel 50 feet with 2 turns activity   Assist Level: Minimal Assistance - Patient > 75%  Wheel 150 feet activity   Assist Level: Maximal Assistance - Patient 25 - 49%    Refer to Care Plan for Long Term Goals  SHORT TERM GOAL WEEK 1 PT Short Term Goal 1 (Week 1): Pt will be max A for transfers with LRAD PT Short Term Goal 2 (Week 1): Pt will be able to maintain sitting balance with mod A for 5 min PT Short Term Goal 3 (Week 1): Pt will be able to propel wc 50 ft with supervision.  Recommendations for other services: Neuropsych and Therapeutic Recreation  Stress management  Skilled Therapeutic Intervention  Evaluation completed (see details above and below) with education on PT POC and goals and individual treatment initiated with focus on improving LE strength, improving sitting balance, improving functional mobility. Pt was resting in bed with figure 8 brace in place upon arrival and agreeable to PT. Pt able to recall spinal precautions and states his goal is to walk out of the hospital. Pt able to roll R and L with mod A to change brief, with total assist for hygiene. Lying>sitting EOB max A to assist with trunk stability and LE management. Sitting EOB, pt demonstrated heavy posterior lean and required UE support and max A to maintain sitting balance. Attempted STS with stedy with max A x2 without success. Pt able to use sara plus to transfer to wheelchair, total A. Stand with sara plus x2, pt reporting pain in his back when standing. Pt propelled wc with BUE  with min A for steering x 50 ft. Pt states he has always had trouble steering a wc (veers to R). Pt returned to room and performed LAQ 2 x 10. Pt required assist to perform LAQ with R leg and became quickly fatigued. Pt remained in wc after session with all needs in reach  and quick release belt in place.   Mobility Bed Mobility Bed Mobility:  Rolling Right;Rolling Left;Supine to Sit;Sitting - Scoot to Edge of Bed Rolling Right: Moderate Assistance - Patient 50-74% Rolling Left: Moderate Assistance - Patient 50-74% Supine to Sit: Maximal Assistance - Patient - Patient 25-49% Sitting - Scoot to [employer] of Bed: Maximal Assistance - Patient 25-49% Transfers Transfers: Radio broadcast assistant (Assistive device): Other (Comment) Transfer via Lift Equipment: Press photographer / Therapist, nutritional Stairs: No Architect: Yes Wheelchair Assistance: Minimal assistance - Patient Building control surveyor: Both upper extremities Wheelchair Parts Management: Needs assistance   Discharge Criteria: Patient will be discharged from PT if patient refuses treatment 3 consecutive times without medical reason, if treatment goals not met, if there is a change in medical status, if patient makes no progress towards goals or if patient is discharged from hospital.  The above assessment, treatment plan, treatment alternatives and goals were discussed and mutually agreed upon: by patient  Sharen Counter, SPT 07/29/2020, 5:14 PM

## 2020-07-29 NOTE — Progress Notes (Signed)
Bilateral lower extremity venous study completed.      Please see CV Proc for preliminary results.   Bricen Victory, RVT  

## 2020-07-30 DIAGNOSIS — R7401 Elevation of levels of liver transaminase levels: Secondary | ICD-10-CM | POA: Diagnosis not present

## 2020-07-30 DIAGNOSIS — M5104 Intervertebral disc disorders with myelopathy, thoracic region: Secondary | ICD-10-CM | POA: Diagnosis not present

## 2020-07-30 DIAGNOSIS — D72829 Elevated white blood cell count, unspecified: Secondary | ICD-10-CM | POA: Diagnosis not present

## 2020-07-30 DIAGNOSIS — G894 Chronic pain syndrome: Secondary | ICD-10-CM | POA: Diagnosis not present

## 2020-07-30 LAB — URINE CULTURE: Culture: <10000 — AB

## 2020-07-30 NOTE — Plan of Care (Signed)
  Problem: SCI BLADDER ELIMINATION Goal: RH STG SCI MANAGE BLADDER PROGRAM W/ASSISTANCE Description: Manage bladder program with min assist Outcome: Not Progressing; bladder scan ; condom cath at hs per pt request removed in am

## 2020-07-30 NOTE — Progress Notes (Signed)
[place] PHYSICAL MEDICINE & REHABILITATION PROGRESS NOTE  Subjective/Complaints: Patient seen laying in bed this morning.  He states he slept fairly well overnight.  He states he had good first day of therapies yesterday.  He denies complaints.  ROS: Denies CP, SOB, N/V/D  Objective: Vital Signs: Blood pressure 126/70, pulse 92, temperature 98.6 F (37 C), resp. rate 18, SpO2 97 %. DG Abd 1 View  Result Date: 07/28/2020 CLINICAL DATA:  65 year old male with abdominal pain. EXAM: ABDOMEN - 1 VIEW COMPARISON:  Lumbar spine radiograph dated 07/19/2022. FINDINGS: No bowel dilatation or evidence of obstruction. No significant colonic stool burden. Faint radiopaque focus over the right renal silhouette may represent a kidney stone. Extensive lower thoracic and lumbar spine fixation hardware. The hardware is intact. The soft tissues are unremarkable. IMPRESSION: No bowel obstruction. Electronically Signed   By: Anner Crete M.D.   On: 07/28/2020 19:23   VAS [place] LOWER EXTREMITY VENOUS (DVT)  Result Date: 07/29/2020  Lower Venous DVT Study Indications: Edema.  Risk Factors: Immobility. Comparison Study: No previous exams Performing Technologist: Vonzell Schlatter RVT  Examination Guidelines: A complete evaluation includes B-mode imaging, spectral Doppler, color Doppler, and power Doppler as needed of all accessible portions of each vessel. Bilateral testing is considered an integral part of a complete examination. Limited examinations for reoccurring indications may be performed as noted. The reflux portion of the exam is performed with the patient in reverse Trendelenburg.  +---------+---------------+---------+-----------+----------+--------------+ RIGHT    CompressibilityPhasicitySpontaneityPropertiesThrombus Aging +---------+---------------+---------+-----------+----------+--------------+ CFV      Full           Yes      Yes                                  +---------+---------------+---------+-----------+----------+--------------+ SFJ      Full                                                        +---------+---------------+---------+-----------+----------+--------------+ FV Prox  Full                                                        +---------+---------------+---------+-----------+----------+--------------+ FV Mid   Full                                                        +---------+---------------+---------+-----------+----------+--------------+ FV DistalFull                                                        +---------+---------------+---------+-----------+----------+--------------+ PFV      Full                                                        +---------+---------------+---------+-----------+----------+--------------+  POP      Full           Yes      Yes                                 +---------+---------------+---------+-----------+----------+--------------+ PTV      Full                                                        +---------+---------------+---------+-----------+----------+--------------+ PERO     Full                                                        +---------+---------------+---------+-----------+----------+--------------+   +---------+---------------+---------+-----------+----------+--------------+ LEFT     CompressibilityPhasicitySpontaneityPropertiesThrombus Aging +---------+---------------+---------+-----------+----------+--------------+ CFV      Full           Yes      Yes                                 +---------+---------------+---------+-----------+----------+--------------+ SFJ      Full                                                        +---------+---------------+---------+-----------+----------+--------------+ FV Prox  Full                                                         +---------+---------------+---------+-----------+----------+--------------+ FV Mid   Full                                                        +---------+---------------+---------+-----------+----------+--------------+ FV DistalFull                                                        +---------+---------------+---------+-----------+----------+--------------+ PFV      Full                                                        +---------+---------------+---------+-----------+----------+--------------+ POP      Full           Yes      Yes                                 +---------+---------------+---------+-----------+----------+--------------+  PTV      Full                                                        +---------+---------------+---------+-----------+----------+--------------+ PERO     Full                                                        +---------+---------------+---------+-----------+----------+--------------+     Summary: BILATERAL: - No evidence of deep vein thrombosis seen in the lower extremities, bilaterally. -No evidence of popliteal cyst, bilaterally.   *See table(s) above for measurements and observations. Electronically signed by Monica Martinez MD on 07/29/2020 at 3:47:57 PM.    Final    Recent Labs    07/29/20 0533  WBC 12.1*  HGB 11.8*  HCT 35.7*  PLT 398   Recent Labs    07/29/20 0533  NA 135  K 4.0  CL 100  CO2 24  GLUCOSE 105*  BUN 10  CREATININE 0.78  CALCIUM 8.8*    Intake/Output Summary (Last 24 hours) at 07/30/2020 1406 Last data filed at 07/30/2020 1300 Gross per 24 hour  Intake 897 ml  Output 1550 ml  Net -653 ml        Physical Exam: BP 126/70   Pulse 92   Temp 98.6 F (37 C)   Resp 18   SpO2 97%  Constitutional: No distress . Vital signs reviewed.  Obese. HENT: Normocephalic.  Atraumatic. Eyes: EOMI. No discharge. Cardiovascular: No JVD.  RRR. Respiratory: Normal effort.  No stridor.   Bilateral clear to auscultation. GI: BS +.  Distended. Skin: Warm and dry.   Neck incision examined today. Psych: Normal mood.  Normal behavior. Musc: No edema in extremities.  No tenderness in extremities. Neuro: Alert Motor: Bilateral upper extremities: 5/5  Right lower extremity: Hip flexion, knee extension 2+/5, ankle dorsiflexion 4-/5, improving Left lower extremity: Hip flexion, knee extension 2/5, ankle dorsiflexion 4 -/5, improving Sensation diminished to light touch bilateral lower extremities   Assessment/Plan: 1. Functional deficits which require 3+ hours per day of interdisciplinary therapy in a comprehensive inpatient rehab setting.  Physiatrist is providing close team supervision and 24 hour management of active medical problems listed below.  Physiatrist and rehab team continue to assess barriers to discharge/monitor patient progress toward functional and medical goals   Care Tool:  Bathing    Body parts bathed by patient: Right arm,Left arm,Chest,Abdomen,Face   Body parts bathed by helper: Front perineal area,Buttocks,Right upper leg,Left upper leg,Right lower leg,Left lower leg     Bathing assist Assist Level: 2 Helpers (EOB + bedlevel)     Upper Body Dressing/Undressing Upper body dressing   What is the patient wearing?: Pull over shirt    Upper body assist Assist Level: Maximal Assistance - Patient 25 - 49%    Lower Body Dressing/Undressing Lower body dressing      What is the patient wearing?: Incontinence brief,Pants     Lower body assist Assist for lower body dressing: Total Assistance - Patient < 25%     Toileting Toileting    Toileting assist Assist for toileting: Maximal Assistance - Patient 25 - 49%  Transfers Chair/bed transfer  Transfers assist     Chair/bed transfer assist level: Dependent - Patient 0%     Locomotion Ambulation   Ambulation assist   Ambulation activity did not occur: Safety/medical concerns           Walk 10 feet activity   Assist  Walk 10 feet activity did not occur: Safety/medical concerns        Walk 50 feet activity   Assist Walk 50 feet with 2 turns activity did not occur: Safety/medical concerns         Walk 150 feet activity   Assist Walk 150 feet activity did not occur: Safety/medical concerns         Walk 10 feet on uneven surface  activity   Assist Walk 10 feet on uneven surfaces activity did not occur: Safety/medical concerns         Wheelchair     Assist Will patient use wheelchair at discharge?: Yes Type of Wheelchair: Manual    Wheelchair assist level: Minimal Assistance - Patient > 75% Max wheelchair distance: 50 ft    Wheelchair 50 feet with 2 turns activity    Assist        Assist Level: Minimal Assistance - Patient > 75%   Wheelchair 150 feet activity     Assist      Assist Level: Maximal Assistance - Patient 25 - 49%    Medical Problem List and Plan: 1.  Bilateral lower extremity weakness with sensory deficits, decreased endurance, poor awareness/lacks insight as well as pain affecting ADLs and mobility secondary to thoracic myelopathy with paraparesis.  Continue CIR 2.  Antithrombotics: -DVT/anticoagulation:  Pharmaceutical: Lovenox    Lower extremity Dopplers ordered             -antiplatelet therapy: N/a 3. Pain Management: Continue oxycodone prn.              --neuropathy BLE and right chest wall-->continue gabapentin 600 mg tid for now.              Monitor with increased exertion 4. H/o anxiety/Mood: LCSW to follow for evaluation and support.              Prozaac             -antipsychotic agents: On Zyprexa 5. Neuropsych: This patient is capable of making decisions on his own behalf. 6. Skin/Wound Care: Routine pressure relief measures. Hypoallergenic sheets.   Prevalon boots for both heels ordered 7. Fluids/Electrolytes/Nutrition: Monitor I/Os.    8. Neurogenic bladder:   UA unremarkable,  urine culture pending  PVRs  Flomax 9. HTN: Monitor BP   Norvasc  Mildly elevated on 2/6, monitor for trend             Monitor with increased mobility 10. Neurogenic bowel:    KUB reviewed, unremarkable             Miralax daily and dulcolax supp w/dig stim after supper.   Improving 11.  Hypoalbuminemia  Supplement initiated on 2/5 12.  Transaminitis  LFTs elevated on 2/5, repeat labs on tomorrow 13.  Leukocytosis  WBC 12.1 on 2/5, labs ordered for tomorrow  Afebrile, no signs/symptoms of infection  LOS: 2 days A FACE TO FACE EVALUATION WAS PERFORMED  Missi Mcmackin Lorie Phenix 07/30/2020, 2:06 PM

## 2020-07-30 NOTE — Progress Notes (Signed)
Physical Therapy Session Note  Patient Details  Name: Randall Mccormick MRN: [id number] Date of Birth: 15-Jun-1956  Today's Date: 07/30/2020 PT Individual Time: 0900-1000 PT Individual Time Calculation (min): 60 min   Short Term Goals: Week 1:  PT Short Term Goal 1 (Week 1): Pt will be max A for transfers with LRAD PT Short Term Goal 2 (Week 1): Pt will be able to maintain sitting balance with mod A for 5 min PT Short Term Goal 3 (Week 1): Pt will be able to propel wc 50 ft with supervision.  Skilled Therapeutic Interventions/Progress Updates:    Pt supine in bed upon arrival and agreeable to therapy. Pt able to roll R and left mod A to change brief after urinary incontinence, and to don shorts. Lying to EOB mod A for lower extremity management and trunk management. Clarise Cruz plus standing x 4 to transfer to and from wc. Pt demonstrates heavy reliance on UE support in sara plus. Wc propulsion 2 x 100 ft with min A for steering. Pt performed dynamic sitting balance work on EOM with min A for balance, giving high fives x 2 min, straight and crossing midline. Pt performed 2 x 5 crunches to physio ball with min A, discussed using head to move body forward. Pt returned to bed after session with mod A for LE management and was left with all needs in reach and bed alarm active.   Therapy Documentation Precautions:  Precautions Precautions: Back,Fall Precaution Comments: paraparesis Required Braces or Orthoses: Spinal Brace Spinal Brace: Other (comment) Spinal Brace Comments: Figure 8 Brace Restrictions Weight Bearing Restrictions: No    Therapy/Group: Individual Therapy  Sharen Counter, SPT 07/30/2020, 2:13 PM

## 2020-07-30 NOTE — Progress Notes (Signed)
Patient refused dulcolax suppository claims he had a good size of bowel movement today.

## 2020-07-31 ENCOUNTER — Inpatient Hospital Stay (HOSPITAL_COMMUNITY): Payer: MEDICARE

## 2020-07-31 DIAGNOSIS — M5104 Intervertebral disc disorders with myelopathy, thoracic region: Secondary | ICD-10-CM | POA: Diagnosis not present

## 2020-07-31 LAB — URINALYSIS, ROUTINE W REFLEX MICROSCOPIC
Bilirubin Urine: NEGATIVE
Glucose, UA: NEGATIVE mg/dL
Ketones, ur: NEGATIVE mg/dL
Nitrite: POSITIVE — AB
Protein, ur: 30 mg/dL — AB
Specific Gravity, Urine: 1.012 (ref 1.005–1.030)
pH: 6 (ref 5.0–8.0)

## 2020-07-31 LAB — COMPREHENSIVE METABOLIC PANEL
ALT: 67 U/L — ABNORMAL HIGH (ref 0–44)
AST: 38 U/L (ref 15–41)
Albumin: 2.7 g/dL — ABNORMAL LOW (ref 3.5–5.0)
Alkaline Phosphatase: 124 U/L (ref 38–126)
Anion gap: 9 (ref 5–15)
BUN: 12 mg/dL (ref 8–23)
CO2: 26 mmol/L (ref 22–32)
Calcium: 8.9 mg/dL (ref 8.9–10.3)
Chloride: 100 mmol/L (ref 98–111)
Creatinine, Ser: 0.81 mg/dL (ref 0.61–1.24)
GFR, Estimated: >60 mL/min (ref >60)
Glucose, Bld: 113 mg/dL — ABNORMAL HIGH (ref 70–99)
Potassium: 3.9 mmol/L (ref 3.5–5.1)
Sodium: 135 mmol/L (ref 135–145)
Total Bilirubin: 0.6 mg/dL (ref 0.3–1.2)
Total Protein: 6.3 g/dL — ABNORMAL LOW (ref 6.5–8.1)

## 2020-07-31 LAB — CBC
HCT: 38.5 % — ABNORMAL LOW (ref 39.0–52.0)
Hemoglobin: 12.2 g/dL — ABNORMAL LOW (ref 13.0–17.0)
MCH: 29.4 pg (ref 26.0–34.0)
MCHC: 31.7 g/dL (ref 30.0–36.0)
MCV: 92.8 fL (ref 80.0–100.0)
Platelets: 447 10*3/uL — ABNORMAL HIGH (ref 150–400)
RBC: 4.15 MIL/uL — ABNORMAL LOW (ref 4.22–5.81)
RDW: 15.4 % (ref 11.5–15.5)
WBC: 17.7 10*3/uL — ABNORMAL HIGH (ref 4.0–10.5)
nRBC: 0 % (ref 0.0–0.2)

## 2020-07-31 MED ORDER — NON FORMULARY: Freq: Two times a day (BID) | Status: DC

## 2020-07-31 MED ORDER — PNEUMOCOCCAL VAC POLYVALENT 25 MCG/0.5ML IJ INJ
0.5000 mL | INJECTION | INTRAMUSCULAR | Status: AC
  Administered 2020-08-01: 0.5 mL via INTRAMUSCULAR
  Filled 2020-07-31: qty 0.5

## 2020-07-31 MED ORDER — CEPHALEXIN 250 MG PO CAPS
500.0000 mg | ORAL_CAPSULE | Freq: Three times a day (TID) | ORAL | Status: DC
  Administered 2020-07-31 – 2020-08-23 (×68): 500 mg via ORAL
  Filled 2020-07-31 (×69): qty 2

## 2020-07-31 MED ORDER — FEXOFENADINE HCL 60 MG PO TABS
60.0000 mg | ORAL_TABLET | Freq: Two times a day (BID) | ORAL | Status: DC
  Administered 2020-07-31 – 2020-09-01 (×63): 60 mg via ORAL
  Filled 2020-07-31 (×64): qty 1

## 2020-07-31 MED ORDER — DULOXETINE HCL 30 MG PO CPEP
30.0000 mg | ORAL_CAPSULE | Freq: Every day | ORAL | Status: DC
  Administered 2020-07-31 – 2020-08-31 (×32): 30 mg via ORAL
  Filled 2020-07-31 (×32): qty 1

## 2020-07-31 MED ORDER — TRIAMCINOLONE 0.1 % CREAM:EUCERIN CREAM 1:1
TOPICAL_CREAM | Freq: Three times a day (TID) | CUTANEOUS | Status: DC
  Administered 2020-08-02 – 2020-08-22 (×6): 1 via TOPICAL
  Filled 2020-07-31 (×3): qty 1

## 2020-07-31 MED ORDER — FLUOXETINE HCL 20 MG PO CAPS
20.0000 mg | ORAL_CAPSULE | Freq: Every day | ORAL | Status: DC
Start: 2020-08-01 — End: 2020-09-01
  Administered 2020-08-01 – 2020-09-01 (×32): 20 mg via ORAL
  Filled 2020-07-31 (×32): qty 1

## 2020-07-31 NOTE — Progress Notes (Signed)
Physical Therapy Session Note  Patient Details  Name: Randall Mccormick MRN: [id number] Date of Birth: [birth year]  Today's Date: 07/31/2020 PT Individual Time: 0900-1000; 1300-1400 PT Individual Time Calculation (min): 60 min and 60 min  Short Term Goals: Week 1:  PT Short Term Goal 1 (Week 1): Pt will be max A for transfers with LRAD PT Short Term Goal 2 (Week 1): Pt will be able to maintain sitting balance with mod A for 5 min PT Short Term Goal 3 (Week 1): Pt will be able to propel wc 50 ft with supervision.  Skilled Therapeutic Interventions/Progress Updates:    Session 1: Pt received seated in bed, agreeable to PT session. Pt reports 7/10 pain in L lateral mid-low back region, reports pain as "stabbing" and constant sensation. Pt pre-medicated prior to start of therapy session and declines further intervention other than repositioning. Assisted pt with donning shorts and shoes at bed level. Pt is able to perform rolling R/L with mod A and skilled cueing for technique. Supine to sit with max A for BLE management and trunk control, HOB elevated and use of bedrail. Sara Plus transfer bed to/from w/c throughout session. Manual w/c propulsion x 100 ft with use of BUE at min A level with assist needed for steering. Pt exhibits some difficulty reaching w/c rims for adequate propulsion due to body habitus (short arms). Seated balance EOM reaching outside BOS and across midline with alt UE with CGA to min A needed for sitting balance. Seated ball toss with min A up to mod A needed for balance due to occasional LOB posteriorly. Pt initially agreeable to remain seated in w/c at end of session, experiences urinary incontinence with use of Sara Plus during transfer back to w/c from mat table. Assisted pt back to bed via [place]. Sit to supine mod A for BLE management. Pt is dependent for brief change and pericare, mod A for rolling L/R. Pt left semi-reclined in bed with needs in reach, bed alarm in place at end  of session.  Session 2: Pt received seated in bed, agreeable to PT session. Pt reports ongoing 7/10 pain in L lateral mid-low back region. Pt reports being premedicated prior to session and has been using ice pack for pain management. Supine to sit max A for BLE management and trunk elevation. Use of Clarise Cruz Plus for transfers bed to w/c to mat table throughout session. Sit to stand x 4 reps from elevated mat table with [place]. While in standing pt able to perform mini-squats x 5 reps, trace activation of R quads noted along with delayed activation. Forward/backward w/c propulsion 2 x 10 ft with focus on use of BLE for strengthening, min to mod A needed. Pt exhibits quick onset of LE muscle fatigue. Pt left seated in w/c in room with needs in reach at end of session.  Therapy Documentation Precautions:  Precautions Precautions: Back,Fall Precaution Comments: paraparesis Required Braces or Orthoses: Spinal Brace Spinal Brace: Other (comment) Spinal Brace Comments: Figure 8 Brace Restrictions Weight Bearing Restrictions: No   Therapy/Group: Individual Therapy   Excell Seltzer, PT, DPT  07/31/2020, 12:14 PM

## 2020-07-31 NOTE — Progress Notes (Signed)
Inpatient Rehabilitation  Patient information reviewed and entered into eRehab system by Magalie Almon M. Yazlyn Wentzel, M.A., CCC/SLP, PPS Coordinator.  Information including medical coding, functional ability and quality indicators will be reviewed and updated through discharge.    

## 2020-07-31 NOTE — Progress Notes (Addendum)
Leucocytosis discussed with Dr. Tedd Sias denies SOB, Cough or dysuria. Bowels moving. Small dressings on back from prior drain sites removed --contact reaction noted. Back diffuse erythema--reports itching and has not gotten hypoallergenic sheets. UA/UCS/CXR ordered to rule out infectious cause. Will also add allegra and triamcinolone for rash/itching.

## 2020-07-31 NOTE — Care Management (Addendum)
Inpatient Rehabilitation Center Individual Statement of Services  Patient Name:  Randall Mccormick  Date:  07/31/2020  Welcome to the [facility].  Our goal is to provide you with an individualized program based on your diagnosis and situation, designed to meet your specific needs.  With this comprehensive rehabilitation program, you will be expected to participate in at least 3 hours of rehabilitation therapies Monday-Friday, with modified therapy programming on the weekends.  Your rehabilitation program will include the following services:  Physical Therapy (PT), Occupational Therapy (OT), 24 hour per day rehabilitation nursing, Therapeutic Recreaction (TR), Psychology, Neuropsychology, Care Coordinator, Rehabilitation Medicine, Nutrition Services, Pharmacy Services and Other  Weekly team conferences will be held on Tuesdays to discuss your progress.  Your Inpatient Rehabilitation Care Coordinator will talk with you frequently to get your input and to update you on team discussions.  Team conferences with you and your family in attendance may also be held.  Expected length of stay: 3-4 weeks    Overall anticipated outcome: Minimal Assistance  Depending on your progress and recovery, your program may change. Your Inpatient Rehabilitation Care Coordinator will coordinate services and will keep you informed of any changes. Your Inpatient Rehabilitation Care Coordinator's name and contact numbers are listed  below.  The following services may also be recommended but are not provided by the [facility] will be made to provide these services after discharge if needed.  Arrangements include referral to agencies that provide these services.  Your insurance has been verified to be:  Medicare A/B  Your primary doctor is:   Nelda Bucks  Pertinent information will be shared with your doctor and your insurance company.  Inpatient Rehabilitation Care Coordinator:  Cathleen Corti [phone] or [phone]  Information discussed with and copy given to patient by: Rana Snare, 07/31/2020, 9:56 AM

## 2020-07-31 NOTE — Progress Notes (Signed)
Occupational Therapy Session Note  Patient Details  Name: Randall Mccormick MRN: [id number] Date of Birth: 1955/11/12  Today's Date: 07/31/2020 OT Individual Time: 6433-2951 OT Individual Time Calculation (min): 60 min    Short Term Goals: Week 1:  OT Short Term Goal 1 (Week 1): Pt will complete BSC transfer with LRAD and 2 assist OT Short Term Goal 2 (Week 1): Pt will complete UB dressing with Mod A OT Short Term Goal 3 (Week 1): Pt will complete 1 grooming task seated EOB with no more than Mod balance assistance   Skilled Therapeutic Interventions/Progress Updates:    Pt greeted at time of session supine in bed c/o back pain, no number given but states it has been hurting on and off all day with ice pack on back, rest breaks PRN and repositioning as needed. Pt needed to urinate, provided urinal at bed level and had incontinent episode d/t urgency. Able to tell when he has to urinate but unable to control per pt. Brief change at bed level, rolling L/R with Mod A overall, dependent brief change with log rolling. Donned shorts back over hips total A as well with rolling. Supine > sit Max A with log roll, sat EOB approx 25 minutes to complete UB bathe/dress with Mod A for sitting balance and fully pulling down in the back and for PA to examine surgical site. Several mild LOB in sitting posteriorly with Min-Mod A to correct during bathing/dressing tasks. Figure 8 brace applied as well per PA instructions from neuro. Bed > w/c via sara lift and pt performed oral hygiene sitting with set up. At this time, pt with incontinent episode of urine, sara lift > bed with nurse present, performed brief change in partial stand in sara lift dependently. Sit to supine Max A, hand off to nursing as pt was being taken down for xray.   Therapy Documentation Precautions:  Precautions Precautions: Back,Fall Precaution Comments: paraparesis Required Braces or Orthoses: Spinal Brace Spinal Brace: Other (comment) Spinal  Brace Comments: Figure 8 Brace Restrictions Weight Bearing Restrictions: No     Therapy/Group: Individual Therapy  Viona Gilmore 07/31/2020, 11:44 AM

## 2020-07-31 NOTE — Progress Notes (Signed)
[facility] PHYSICAL MEDICINE & REHABILITATION PROGRESS NOTE  Subjective/Complaints:  Pt reports feeling good- is sore from therapy, but not in "pain".   Has muscles spasms- can be severe- if doesn't get gabapentin, then not controlled, but gabapentin helps   Does describe nerve pain- stabbing, shooting R>L abd pain as well.   Knows when needs to void/pee, but cannot even get urinal in place, it occurs so fast.  Was cathed first day, but not since.   Using condom catheter right now- from night time.   Says has control of bowels- LBM this weekend.     ROS:  Pt denies SOB, abd pain, CP, N/V/C/D, and vision changes   Objective: Vital Signs: Blood pressure (!) 155/90, pulse 96, temperature 98.8 F (37.1 C), temperature source Oral, resp. rate 20, SpO2 94 %. VAS [place] LOWER EXTREMITY VENOUS (DVT)  Result Date: 07/29/2020  Lower Venous DVT Study Indications: Edema.  Risk Factors: Immobility. Comparison Study: No previous exams Performing Technologist: Vonzell Schlatter RVT  Examination Guidelines: A complete evaluation includes B-mode imaging, spectral Doppler, color Doppler, and power Doppler as needed of all accessible portions of each vessel. Bilateral testing is considered an integral part of a complete examination. Limited examinations for reoccurring indications may be performed as noted. The reflux portion of the exam is performed with the patient in reverse Trendelenburg.  +---------+---------------+---------+-----------+----------+--------------+ RIGHT    CompressibilityPhasicitySpontaneityPropertiesThrombus Aging +---------+---------------+---------+-----------+----------+--------------+ CFV      Full           Yes      Yes                                 +---------+---------------+---------+-----------+----------+--------------+ SFJ      Full                                                        +---------+---------------+---------+-----------+----------+--------------+  FV Prox  Full                                                        +---------+---------------+---------+-----------+----------+--------------+ FV Mid   Full                                                        +---------+---------------+---------+-----------+----------+--------------+ FV DistalFull                                                        +---------+---------------+---------+-----------+----------+--------------+ PFV      Full                                                        +---------+---------------+---------+-----------+----------+--------------+  POP      Full           Yes      Yes                                 +---------+---------------+---------+-----------+----------+--------------+ PTV      Full                                                        +---------+---------------+---------+-----------+----------+--------------+ PERO     Full                                                        +---------+---------------+---------+-----------+----------+--------------+   +---------+---------------+---------+-----------+----------+--------------+ LEFT     CompressibilityPhasicitySpontaneityPropertiesThrombus Aging +---------+---------------+---------+-----------+----------+--------------+ CFV      Full           Yes      Yes                                 +---------+---------------+---------+-----------+----------+--------------+ SFJ      Full                                                        +---------+---------------+---------+-----------+----------+--------------+ FV Prox  Full                                                        +---------+---------------+---------+-----------+----------+--------------+ FV Mid   Full                                                        +---------+---------------+---------+-----------+----------+--------------+ FV DistalFull                                                         +---------+---------------+---------+-----------+----------+--------------+ PFV      Full                                                        +---------+---------------+---------+-----------+----------+--------------+ POP      Full           Yes      Yes                                 +---------+---------------+---------+-----------+----------+--------------+  PTV      Full                                                        +---------+---------------+---------+-----------+----------+--------------+ PERO     Full                                                        +---------+---------------+---------+-----------+----------+--------------+     Summary: BILATERAL: - No evidence of deep vein thrombosis seen in the lower extremities, bilaterally. -No evidence of popliteal cyst, bilaterally.   *See table(s) above for measurements and observations. Electronically signed by Monica Martinez MD on 07/29/2020 at 3:47:57 PM.    Final    Recent Labs    07/29/20 0533 07/31/20 0608  WBC 12.1* 17.7*  HGB 11.8* 12.2*  HCT 35.7* 38.5*  PLT 398 447*   Recent Labs    07/29/20 0533 07/31/20 0608  NA 135 135  K 4.0 3.9  CL 100 100  CO2 24 26  GLUCOSE 105* 113*  BUN 10 12  CREATININE 0.78 0.81  CALCIUM 8.8* 8.9    Intake/Output Summary (Last 24 hours) at 07/31/2020 1109 Last data filed at 07/31/2020 0838 Gross per 24 hour  Intake 598 ml  Output 1600 ml  Net -1002 ml        Physical Exam: BP (!) 155/90 (BP Location: Left Arm)   Pulse 96   Temp 98.8 F (37.1 C) (Oral)   Resp 20   SpO2 94%  Constitutional: awake, on side in bed; appropriate, NAD HENT: Normocephalic.  Atraumatic. Eyes: EOMI. No discharge. Cardiovascular: no JVD- RRR Respiratory: CTA B/L- no W/R/R- good air movementn. GI: Soft, NT, distended vs protuberant, (+)BS  Skin: Warm and dry.   Neck incision not examined today. Psych: appropriate Musc: No edema in  extremities.  No tenderness in extremities. Neuro: Alert Motor: Bilateral upper extremities: 5/5  Right lower extremity: Hip flexion, knee extension 2+/5, ankle dorsiflexion 4-/5, improving Left lower extremity: Hip flexion, knee extension 2/5, ankle dorsiflexion 4 -/5, improving Sensation diminished to light touch bilateral lower extremities   Assessment/Plan: 1. Functional deficits which require 3+ hours per day of interdisciplinary therapy in a comprehensive inpatient rehab setting.  Physiatrist is providing close team supervision and 24 hour management of active medical problems listed below.  Physiatrist and rehab team continue to assess barriers to discharge/monitor patient progress toward functional and medical goals   Care Tool:  Bathing    Body parts bathed by patient: Right arm,Left arm,Chest,Abdomen,Face   Body parts bathed by helper: Front perineal area,Buttocks,Right upper leg,Left upper leg,Right lower leg,Left lower leg     Bathing assist Assist Level: 2 Helpers (EOB + bedlevel)     Upper Body Dressing/Undressing Upper body dressing   What is the patient wearing?: Pull over shirt    Upper body assist Assist Level: Maximal Assistance - Patient 25 - 49%    Lower Body Dressing/Undressing Lower body dressing      What is the patient wearing?: Incontinence brief,Pants     Lower body assist Assist for lower body dressing: Total Assistance - Patient < 25%     Toileting Toileting  Toileting assist Assist for toileting: 2 Helpers     Transfers Chair/bed transfer  Transfers assist     Chair/bed transfer assist level: Dependent - Patient 0%     Locomotion Ambulation   Ambulation assist   Ambulation activity did not occur: Safety/medical concerns          Walk 10 feet activity   Assist  Walk 10 feet activity did not occur: Safety/medical concerns        Walk 50 feet activity   Assist Walk 50 feet with 2 turns activity did not  occur: Safety/medical concerns         Walk 150 feet activity   Assist Walk 150 feet activity did not occur: Safety/medical concerns         Walk 10 feet on uneven surface  activity   Assist Walk 10 feet on uneven surfaces activity did not occur: Safety/medical concerns         Wheelchair     Assist Will patient use wheelchair at discharge?: Yes Type of Wheelchair: Manual    Wheelchair assist level: Minimal Assistance - Patient > 75% Max wheelchair distance: 50 ft    Wheelchair 50 feet with 2 turns activity    Assist        Assist Level: Minimal Assistance - Patient > 75%   Wheelchair 150 feet activity     Assist      Assist Level: Maximal Assistance - Patient 25 - 49%    Medical Problem List and Plan: 1.  Bilateral lower extremity weakness with sensory deficits, decreased endurance, poor awareness/lacks insight as well as pain affecting ADLs and mobility secondary to thoracic myelopathy with paraparesis.  Continue CIR 2.  Antithrombotics: -DVT/anticoagulation:  Pharmaceutical: Lovenox    Lower extremity Dopplers ordered  2/7- Dopplers (-)- will con't Lovenox             -antiplatelet therapy: N/a 3. Pain Management: Continue oxycodone prn.              --neuropathy BLE and right chest wall-->continue gabapentin 600 mg tid for now.   2/7- will Add Duloxetine 30 mg QHS and decrease Prozac to 20 mg daily since needs help with nerve pain and already at 600 mg TID of gabapentin             Monitor with increased exertion 4. H/o anxiety/Mood: LCSW to follow for evaluation and support.              Prozaac  2/7- decrease to 20 mg daily so has Duloxetine             -antipsychotic agents: On Zyprexa 5. Neuropsych: This patient is capable of making decisions on his own behalf. 6. Skin/Wound Care: Routine pressure relief measures. Hypoallergenic sheets.   Prevalon boots for both heels ordered 7. Fluids/Electrolytes/Nutrition: Monitor I/Os.    8.  Neurogenic bladder:   UA unremarkable, urine culture pending  2/7- will recheck U/A and Cx due to incontinence AND leukocytosis  PVRs  Flomax 9. HTN: Monitor BP   Norvasc  Mildly elevated on 2/6, monitor for trend  2/7- BP 155/90- will give 1 more day and then increase meds as required             Monitor with increased mobility 10. Neurogenic bowel:    KUB reviewed, unremarkable             Miralax daily and dulcolax supp w/dig stim after supper.   2/7- says has control,  but requiring bowel program?  Improving 11.  Hypoalbuminemia  Supplement initiated on 2/5 12.  Transaminitis  LFTs elevated on 2/5, repeat labs on tomorrow  2/7- LFTs 38 and 67- 1 up slightly; 1 down slightly- con't ot monitor 13.  Leukocytosis  WBC 12.1 on 2/5, labs ordered for tomorrow  2/7- WBC 17.7!- will check U/A and Cx again AND CXR since WBC up so much- not on steroids. Afebrile and says he doesn't feel ill.   Afebrile, no signs/symptoms of infection  LOS: 3 days A FACE TO FACE EVALUATION WAS PERFORMED  Pratyush Ammon 07/31/2020, 11:09 AM

## 2020-07-31 NOTE — IPOC Note (Signed)
Overall Plan of Care [facility]) Patient Details Name: Randall Mccormick MRN: [id number] DOB: 1955/08/11  Admitting Diagnosis: Thoracic disc disease with myelopathy  Hospital Problems: Principal Problem:   Thoracic disc disease with myelopathy Active Problems:   Constipation   Neurogenic bowel   Neurogenic bladder   Neuropathic pain   Chronic pain syndrome   Leukocytosis   Transaminitis   Hypoalbuminemia due to protein-calorie malnutrition ([place])     Functional Problem List: Nursing Bladder,Bowel,Endurance,Pain,Safety,Skin Integrity,Sensory,Motor  PT Balance,Endurance,Motor,Pain,Skin Integrity,Sensory,Safety  OT Balance,Safety,Sensory,Skin Integrity,Endurance,Motor,Pain  SLP    TR         Basic ADL's: OT Grooming,Bathing,Dressing,Toileting     Advanced  ADL's: OT Simple Meal Preparation     Transfers: PT Bed Mobility,Bed to [place]  OT Toilet,Tub/Shower     Locomotion: PT Ambulation,Wheelchair Mobility,Stairs     Additional Impairments: OT None  SLP        TR      Anticipated Outcomes Item Anticipated Outcome  Self Feeding No goal  Swallowing      Basic self-care  Min-Mod A  Toileting  Mod A   Bathroom Transfers Min A  Bowel/Bladder  manage bowel and bladder wirh min assist  Transfers  Min A  Locomotion  Min A overall, likely wheelchair level  Communication     Cognition     Pain  pain level less than 4 on scale of 0-10  Safety/Judgment  remain free of injury, prevent falls with cues and reminders   Therapy Plan: PT Intensity: Minimum of 1-2 x/day ,45 to 90 minutes PT Frequency: 5 out of 7 days PT Duration Estimated Length of Stay: 25-28 days OT Intensity: Minimum of 1-2 x/day, 45 to 90 minutes OT Frequency: 5 out of 7 days OT Duration/Estimated Length of Stay: 3-4 weeks     Due to the current state of emergency, patients may not be receiving their 3-hours of Medicare-mandated therapy.   Team Interventions: Nursing  Interventions Patient/Family Education,Pain Management,Bladder Management,Medication Management,Bowel Multimedia programmer Planning  PT interventions Ambulation/gait training,Balance/vestibular training,Community reintegration,Discharge planning,DME/adaptive equipment instruction,Neuromuscular re-education,Functional electrical stimulation,Pain management,Functional mobility training,Patient/family education,Psychosocial support,Therapeutic Activities,Therapeutic Exercise,UE/LE Strength taining/ROM,UE/LE Coordination activities,Wheelchair propulsion/positioning,Stair training,Splinting/orthotics  OT Interventions Balance/vestibular training,Discharge planning,Pain management,Self Care/advanced ADL retraining,UE/LE Coordination activities,Therapeutic Exercise,Patient/family education,Functional mobility training,Disease mangement/prevention,Community reintegration,DME/adaptive equipment instruction,Neuromuscular re-education,Psychosocial support,Splinting/orthotics,UE/LE Strength taining/ROM,Wheelchair propulsion/positioning  SLP Interventions    TR Interventions    SW/CM Interventions Discharge Planning,Psychosocial Support,Patient/Family Education   Barriers to Discharge MD  Medical stability, Home enviroment access/loayout, Incontinence, Neurogenic bowel and bladder, Wound care, Weight and Weight bearing restrictions  Nursing      PT Inaccessible home environment,Home environment access/layout,Incontinence,Neurogenic Bowel & Bladder,Wound Care    OT Inaccessible home environment,Lack of/limited family support,Incontinence,Home environment Child psychotherapist    SLP      SW       Team Discharge Planning: Destination: PT-Home ,OT- Home , SLP-  Projected Follow-up: PT-Home health PT,24 hour supervision/assistance, OT-  24 hour supervision/assistance, SLP-  Projected Equipment Needs: PT-Wheelchair cushion (measurements),Wheelchair (measurements), OT- To be determined, SLP-   Equipment Details: PT-TBD pending progress, OT-  Patient/family involved in discharge planning: PT- Patient,  OT-Patient, SLP-   MD ELOS: 25-28 days Medical Rehab Prognosis:  Fair Assessment: Pt is a 65 yr old male with  Thoracic myelopathy with paraparesis and neurogenic bowel and bladder and spasticity-  As well as nerve pain.  Has WBC of 17.7- rising working this up- also trying to reat nerve pain more- adding Duloxetine for nerve pain.   Goals min-mod A by d/c  at the end of 4 weeks or so.   See Team Conference Notes for weekly updates to the plan of care

## 2020-07-31 NOTE — Progress Notes (Signed)
PA Pam notified of pt UA results.  Sheela Stack, LPN

## 2020-07-31 NOTE — Progress Notes (Signed)
Incision on back is clean and dry. No drainage noted. Incision approximated. No redness or edema. Pt denies pain at site. Rash noted on back. Cream applied. Sheela Stack, LPN

## 2020-07-31 NOTE — Progress Notes (Signed)
Inpatient Rehabilitation Care Coordinator Assessment and Plan Patient Details  Name: Randall Mccormick MRN: [id number] Date of Birth: April 02, 1956  Today's Date: 07/31/2020  Hospital Problems: Principal Problem:   Thoracic disc disease with myelopathy Active Problems:   Constipation   Neurogenic bowel   Neurogenic bladder   Neuropathic pain   Chronic pain syndrome   Leukocytosis   Transaminitis   Hypoalbuminemia due to protein-calorie malnutrition [facility])  Past Medical History:  Past Medical History:  Diagnosis Date  . Anxiety   . Cobalamin deficiency   . Degeneration of lumbar or lumbosacral intervertebral disc   . Depression   . Gouty arthropathy, unspecified   . Hemorrhoids   . Hyperlipidemia   . Hypertension   . Multiple fractures of ribs of left side 04/20/13   FALL FROM A LADDER ON   . Vitamin D deficiency    Past Surgical History:  Past Surgical History:  Procedure Laterality Date  . APPENDECTOMY    . CHOLECYSTECTOMY  2010   [facility]  . TONSILLECTOMY     Social History:  reports that he has never smoked. His smokeless tobacco use includes chew. He reports current alcohol use. No history on file for drug use.  Family / Support Systems Marital Status: Married How Long?: 46 years Patient Roles: Spouse,Parent Spouse/Significant Other: [name] (wife): [phone] home/[phone] cell Children: 1 adult daughter Other Supports: None reported Anticipated Caregiver: wife Ability/Limitations of Caregiver: wife to provide 24/7 care Caregiver Availability: 24/7 Family Dynamics: pt is retired and lives with wife.  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 Education: some college Read: Yes Write: Yes Employment Status: Disabled Date Retired/Disabled/Unemployed: SSDI-2020 due to medical issues Legal History/Current Legal Issues: Denies Guardian/Conservator: N/A    Abuse/Neglect Abuse/Neglect Assessment Can Be Completed: Yes Physical Abuse: Denies Verbal Abuse: Denies Sexual Abuse: Denies Exploitation of patient/patient's resources: Denies Self-Neglect: Denies  Emotional Status Pt's affect, behavior and adjustment status: Pt in good spirits at time of visit. Recent Psychosocial Issues: Pt admits that he takes Prozac for depression prescribed by PCP Dr. Delena Bali. 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  Patient / Family Perceptions, Expectations & Goals Pt/Family understanding of illness & functional limitations: Pt has general understanding of care needs Premorbid pt/family roles/activities: Independent Anticipated changes in roles/activities/participation: Assistance with ADLs/IADLs Pt/family expectations/goals: Pt foal is to "get where I can walk with DME to help take care of myself."  [employer]: None Premorbid Home Care/DME Agencies: None Transportation available at discharge: wife Resource referrals recommended: Neuropsychology  Discharge Planning Living Arrangements: Spouse/significant other Support Systems: Spouse/significant other Type of Residence: Private residence Insurance Resources: Chartered certified accountant Resources: SSD Financial Screen Referred: No Living Expenses: Own Money Management: Spouse Does the patient have any problems obtaining your medications?: No Home Management: Pt and wife manange home care needs Care Coordinator Barriers to Discharge: Decreased caregiver support,Lack of/limited family support Care Coordinator Anticipated Follow Up Needs: HH/OP Expected length of stay: 3-4 weeks  Clinical Impression SW met with pt in room to introduce self, explain role, and discuss discharge process. Pt states HCPOA is his wife [name]; SW asked if these items can be brought in. Pt is not a English as a second language teacher. DME: RW, cane, shower seat, grab bars in  bathroom around toilet in shower. One level home with 3 steps to enter at [place]. Pt aware SW to follow-up with his wife.  SW called  pt wife [name] [phone]) and left message to introduce self, explain role, and discuss discharge process. SW informed will follow-up tomorrow after team conference with updates.   Lisandro Meggett A Jania Steinke 07/31/2020, 1:14 PM

## 2020-07-31 NOTE — Progress Notes (Signed)
New PrimoFit external catheter applied to pt for noc per pt request.  Sheela Stack, LPN

## 2020-08-01 DIAGNOSIS — M5104 Intervertebral disc disorders with myelopathy, thoracic region: Secondary | ICD-10-CM | POA: Diagnosis not present

## 2020-08-01 NOTE — Progress Notes (Signed)
Patient ID: Randall Mccormick, male   DOB: 05/13/1956, 65 y.o.   MRN: [id number]  SW met with pt in room to provide updates from team conference. Pt made efforts to call his wife while SW in room but no answer. SW informed pt on d/c date est. 4 weeks. Pt aware SW to follow-up with his wife.   Loralee Pacas, MSW, [place] Office: [phone] Cell: [phone] Fax: [phone]

## 2020-08-01 NOTE — Progress Notes (Signed)
Physical Therapy Session Note  Patient Details  Name: Randall Mccormick MRN: [id number] Date of Birth: 04/11/1956  Today's Date: 08/01/2020 PT Individual Time: 0900-1015,1615-1700 PT Individual Time Calculation (min): 75 min, 45 min  Short Term Goals: Week 1:  PT Short Term Goal 1 (Week 1): Pt will be max A for transfers with LRAD PT Short Term Goal 2 (Week 1): Pt will be able to maintain sitting balance with mod A for 5 min PT Short Term Goal 3 (Week 1): Pt will be able to propel wc 50 ft with supervision.  Skilled Therapeutic Interventions/Progress Updates:    AM session: Pt was semi-reclined in bed on arrival and agreeable to therapy. Pt states he is having pain in his middle back, not rated. Tender to palpation over paraspinals, some relief with STM. Pain addressed with positioning, frequent rest breaks, and a moist heat pack for 15 min after session. Skin assessed after 15 minutes, no apparent issues. Pt able to roll with min A for donning shorts, dependent for shoes. Pt sat up to EOB with mod A for LE management and trunk stability. Sara plus transfer EOB<>wc<>mat table. Pt propelled wc with BUE x50 ft before becoming fatigued. Once on mat table pt performed crunches to physioball 2x6 with improving technique from previous attempts. Pt reports he has an old hernia and is concerned with using his stomach too much. Pt performed 2x4 "around the world" reaches (forward, lateral, posterior) and return to center. Pt was able to control COM when reaching outside of BOS. Attempted standing with sara plus x 2, pt states he needs to stop 2/2 pain. Returned to room and returned to bed for brief change with sara plus. Min A for rolling, dependent for hygiene. Pt was unaware that he had a very small episode of bowel incontinence in addition to urinary incontinence.  Pt left in bed with all needs met, bed alarm on,  and call bell in reach.  PM session: Pt resting in bed on arrival and agreeable to therapy but  states his pain is 8/10. Reports the moist heat back  was helpful and he would like to use again after session. Pain addressed with positioning, frequent rest breaks, and moist hot pack after session, assessed and removed after 15 minutes, no apparent issues.. Pt sat up to EOB with mod A, stated he had been incontinent and returned to supine, also mod A. Pt mod A for rolling during bed level brief change, dependent for hygiene. Supine>EOB with max A due to fatigue. Attempted marches, stopped due to back pain. Pt performed LAQ 2 x 10, kicked ball x 10 min, and tossed ball x 5 min. Pt required frequent rest breaks due to fatigue and pain. Figure 8 brace doffed while sitting EOB, Sit>supine max A for speed to perform bed level brief change with mod A for rolling. Pt was very fatigued and was shaky at the end of session. Pt was left resting in bed with call bell in reach, all needs met, and bed alarm active.   Therapy Documentation Precautions:  Precautions Precautions: Back,Fall Precaution Comments: paraparesis Required Braces or Orthoses: Spinal Brace Spinal Brace: Other (comment) Spinal Brace Comments: Figure 8 Brace Restrictions Weight Bearing Restrictions: No General:   Therapy/Group: Individual Therapy   , SPT 08/01/2020, 7:44 AM  

## 2020-08-01 NOTE — Patient Care Conference (Signed)
Inpatient RehabilitationTeam Conference and Plan of Care Update Date: 08/01/2020   Time: 11:02 AM    Patient Name: Randall Mccormick      Medical Record Number: [id number]  Date of Birth: [birth year] Sex: Male         Room/Bed: 4M04C/4M04C-01 Payor Info: Payor: MEDICARE / Plan: MEDICARE PART A AND B / Product Type: *No Product type* /    Admit Date/Time:  07/28/2020  2:39 PM  Primary Diagnosis:  Thoracic disc disease with myelopathy  Hospital Problems: Principal Problem:   Thoracic disc disease with myelopathy Active Problems:   Constipation   Neurogenic bowel   Neurogenic bladder   Neuropathic pain   Chronic pain syndrome   Leukocytosis   Transaminitis   Hypoalbuminemia due to protein-calorie malnutrition [facility])    Expected Discharge Date: Expected Discharge Date:  (4 Weeks)  Team Members Present: Physician leading conference: Dr. Courtney Heys Care Coodinator Present: Loralee Pacas, LCSWA;Tharon Kitch Creig Hines, RN, BSN, [place] Nurse Present: Annita Brod, LPN PT Present: Excell Seltzer, PT OT Present: Roanna Epley, COTA;Jennifer Tamala Julian, OT PPS Coordinator present : Gunnar Fusi, SLP     Current Status/Progress Goal Weekly Team Focus  Bowel/Bladder   Continent  and incontinent episodes of bladder/bowel, LBM 07/30/20  restore continence  QS/PRN assessment , time toleting and Primo-fit at [facility]   Swallow/Nutrition/ Hydration             ADL's   bathing-mod A; UB dressing-mod A; LB dressing-tot A; sitting balance-min A; tranfsers with Clarise Cruz Plus  min/mod A overall  bed mobility, activity tolerance, BADL training, functional transfers, safety awareness   Mobility   mod A rolling, max A supine to/from sit, dependent to stand and transfer with Clarise Cruz Plus, min A w/c mobility x 100 ft (difficult due to body habitus)  min A overall, likely w/c level (will need a ramp)  pain management, sitting balance, w/c mobility, standing as able and tolerated, LE NMR   Communication              Safety/Cognition/ Behavioral Observations            Pain   Surgical         Skin               Discharge Planning:  D/c to home with 24/7 care from pt wife.   Team Discussion: Experiencing nerve pain; added Cymbalta and decreased Prozac. Possible UTI. On bowel program, refusing bowel medications last few evenings, still claims to need dig stem. However, has control over bowels. Continent B/B, nursing reports refusing suppository and dig stem since 07/29/20. Received 10 mg Oxy at 0800. At 1000 complained of 9/10 pain. Alternated Tramadol and Oxy, seemed to work well. Rash to back. MD states doesn't have to wear Figure 8 dressing while in bed, has to while in therapy. Will be patient and wife at home, no other support.  Patient on target to meet rehab goals: Mod-max assist, Clarise Cruz lift for transfers. Working on sitting balance, will fall posteriorly. Frequently incontinent of urine with PT. Mod assist upper body bathing and dressing. Total assist lower body bathing and dressing. Min-mod assist goals.   *See Care Plan and progress notes for long and short-term goals.   Revisions to Treatment Plan:  Keflex for UTI. Teaching Needs: Family education, medication management, wound/skin care, transfer training, gait training, endurance training, weight bearing precautions.  Current Barriers to Discharge: Inaccessible home environment, Decreased caregiver support, Home enviroment access/layout, Neurogenic bowel and bladder, Wound care,  Lack of/limited family support, Weight, Weight bearing restrictions, Medication compliance and Behavior  Possible Resolutions to Barriers: Continue current medications, provide emotional support to patient and family.     Medical Summary Current Status: incontinent bladder/bowel- on bowel program- refusing  dig stim/suppositories x4+ nights-LBM incontinent was 2/6; incision looks good;  Barriers to Discharge: Neurogenic Bowel &  Bladder;Incontinence;Behavior;Decreased family/caregiver support;Home enviroment access/layout;Weight;Weight bearing restrictions;Other (comments);Medical stability;Medication compliance  Barriers to Discharge Comments: rash on back/improving; UTI on Keflex; incomplete paraplegia; alternates tramadol/oxy- helped some- will change figure * brace to just with therapies. Possible Resolutions to [employer]: going home with wife- no other support; mod-max A bed -Clarise Cruz plus with transfers; w/c- 50-100 ft-max- short arms- falls posteriorly with sitting; frequent urinaary incontience- FREQUENT; OT- total A LB ADLs; goals min-mod A at best;  d/c- 4 weeks   Continued Need for Acute Rehabilitation Level of Care: The patient requires daily medical management by a physician with specialized training in physical medicine and rehabilitation for the following reasons: Direction of a multidisciplinary physical rehabilitation program to maximize functional independence : Yes Medical management of patient stability for increased activity during participation in an intensive rehabilitation regime.: Yes Analysis of laboratory values and/or radiology reports with any subsequent need for medication adjustment and/or medical intervention. : Yes   I attest that I was present, lead the team conference, and concur with the assessment and plan of the team.   Cristi Loron 08/01/2020, 4:47 PM

## 2020-08-01 NOTE — Progress Notes (Signed)
[place] PHYSICAL MEDICINE & REHABILITATION PROGRESS NOTE  Subjective/Complaints:  Pt reports has UTI- thinks that could be why cannot control his urine/having incontinence.  Had no results with bowel program last night- LBM 2 days ago.   Is wet- didn't tell nurse this AM.     ROS:    Pt denies SOB, abd pain, CP, N/V/C/D, and vision changes     Objective: Vital Signs: Blood pressure 139/72, pulse 87, temperature 97.8 F (36.6 C), temperature source Oral, resp. rate 18, height 5\' 8"  (1.727 m), weight 103.6 kg, SpO2 94 %. DG Chest 2 View  Result Date: 07/31/2020 CLINICAL DATA:  Leukocytosis. EXAM: CHEST - 2 VIEW COMPARISON:  April 21, 2013. FINDINGS: The heart size and mediastinal contours are within normal limits. Both lungs are clear. The visualized skeletal structures are unremarkable. IMPRESSION: No active cardiopulmonary disease. Electronically Signed   By: Marijo Conception M.D.   On: 07/31/2020 12:21   Recent Labs    07/31/20 0608  WBC 17.7*  HGB 12.2*  HCT 38.5*  PLT 447*   Recent Labs    07/31/20 0608  NA 135  K 3.9  CL 100  CO2 26  GLUCOSE 113*  BUN 12  CREATININE 0.81  CALCIUM 8.9    Intake/Output Summary (Last 24 hours) at 08/01/2020 0836 Last data filed at 08/01/2020 0734 Gross per 24 hour  Intake 960 ml  Output 1825 ml  Net -865 ml        Physical Exam: BP 139/72 (BP Location: Left Arm)   Pulse 87   Temp 97.8 F (36.6 C) (Oral)   Resp 18   Ht 5\' 8"  (1.727 m)   Wt 103.6 kg   SpO2 94%   BMI 34.73 kg/m  Constitutional: awake, lying in bed, NAD HENT: Normocephalic.  Atraumatic. Eyes: EOMI. No discharge. Cardiovascular: no JVD, RRR Respiratory: CTA B/L- no W/R/R- good air movement GI: Soft, NT, ND, (+)BS - hypoactive Skin: Warm and dry.   Back incision almost healed- less redness/less rash/less intense color- a little puffy in midline back upper/mid thoracic.  Psych: appropriate, but didn't tell nurse was wet- can't understand  why Musc: No edema in extremities.  No tenderness in extremities. Neuro: Alert Motor: Bilateral upper extremities: 5/5  Right lower extremity: Hip flexion, knee extension 2+/5, ankle dorsiflexion 4-/5, improving Left lower extremity: Hip flexion, knee extension 2/5, ankle dorsiflexion 4 -/5, improving Sensation diminished to light touch bilateral lower extremities   Assessment/Plan: 1. Functional deficits which require 3+ hours per day of interdisciplinary therapy in a comprehensive inpatient rehab setting.  Physiatrist is providing close team supervision and 24 hour management of active medical problems listed below.  Physiatrist and rehab team continue to assess barriers to discharge/monitor patient progress toward functional and medical goals   Care Tool:  Bathing    Body parts bathed by patient: Right arm,Left arm,Chest,Abdomen,Face   Body parts bathed by helper: Front perineal area (declined washing lower body today)     Bathing assist Assist Level: Moderate Assistance - Patient 50 - 74% (note pt only washed UB and periarea)     Upper Body Dressing/Undressing Upper body dressing   What is the patient wearing?: Pull over shirt    Upper body assist Assist Level: Moderate Assistance - Patient 50 - 74%    Lower Body Dressing/Undressing Lower body dressing      What is the patient wearing?: Incontinence brief,Pants     Lower body assist Assist for lower body dressing:  Total Assistance - Patient < 25%     Toileting Toileting    Toileting assist Assist for toileting: Total Assistance - Patient < 25% (urinal, bed level)     Transfers Chair/bed transfer  Transfers assist     Chair/bed transfer assist level: Dependent - mechanical lift     Locomotion Ambulation   Ambulation assist   Ambulation activity did not occur: Safety/medical concerns          Walk 10 feet activity   Assist  Walk 10 feet activity did not occur: Safety/medical concerns         Walk 50 feet activity   Assist Walk 50 feet with 2 turns activity did not occur: Safety/medical concerns         Walk 150 feet activity   Assist Walk 150 feet activity did not occur: Safety/medical concerns         Walk 10 feet on uneven surface  activity   Assist Walk 10 feet on uneven surfaces activity did not occur: Safety/medical concerns         Wheelchair     Assist Will patient use wheelchair at discharge?: Yes Type of Wheelchair: Manual    Wheelchair assist level: Minimal Assistance - Patient > 75% Max wheelchair distance: 100'    Wheelchair 50 feet with 2 turns activity    Assist        Assist Level: Minimal Assistance - Patient > 75%   Wheelchair 150 feet activity     Assist      Assist Level: Maximal Assistance - Patient 25 - 49%    Medical Problem List and Plan: 1.  Bilateral lower extremity weakness with sensory deficits, decreased endurance, poor awareness/lacks insight as well as pain affecting ADLs and mobility secondary to thoracic myelopathy with paraparesis.  Continue CIR 2.  Antithrombotics: -DVT/anticoagulation:  Pharmaceutical: Lovenox    Lower extremity Dopplers ordered  2/7- Dopplers (-)- will con't Lovenox             -antiplatelet therapy: N/a 3. Pain Management: Continue oxycodone prn.              --neuropathy BLE and right chest wall-->continue gabapentin 600 mg tid for now.   2/7- will Add Duloxetine 30 mg QHS and decrease Prozac to 20 mg daily since needs help with nerve pain and already at 600 mg TID of gabapentin  2/8- no change so far- con't regimen             Monitor with increased exertion 4. H/o anxiety/Mood: LCSW to follow for evaluation and support.              Prozaac  2/7- decrease to 20 mg daily so has Duloxetine             -antipsychotic agents: On Zyprexa 5. Neuropsych: This patient is capable of making decisions on his own behalf. 6. Skin/Wound Care: Routine pressure relief measures.  Hypoallergenic sheets.   Prevalon boots for both heels ordered 7. Fluids/Electrolytes/Nutrition: Monitor I/Os.    8. Neurogenic bladder:   UA unremarkable, urine culture pending  2/7- will recheck U/A and Cx due to incontinence AND leukocytosis  2/8- F/U U/A is strongly (+) for UTI- started Keflex 500 mg TID- awaiting Cx- will recheck CBC in AM- wait on stopping Flomax.   PVRs  Flomax 9. HTN: Monitor BP   Norvasc  Mildly elevated on 2/6, monitor for trend  2/7- BP 155/90- will give 1 more day and then increase  meds as required  2/8- BP 139/72- con't regimen             Monitor with increased mobility 10. Neurogenic bowel:    KUB reviewed, unremarkable             Miralax daily and dulcolax supp w/dig stim after supper.   2/7- says has control, but requiring bowel program?  2/8- Says still needs dig stim, but says has control- didn't have BM last night with program- but will con't because pt thinks he still needs dig stim.   Improving 11.  Hypoalbuminemia  Supplement initiated on 2/5 12.  Transaminitis  LFTs elevated on 2/5, repeat labs on tomorrow  2/7- LFTs 38 and 67- 1 up slightly; 1 down slightly- con't ot monitor 13.  Leukocytosis  WBC 12.1 on 2/5, labs ordered for tomorrow  2/7- WBC 17.7!- will check U/A and Cx again AND CXR since WBC up so much- not on steroids. Afebrile and says he doesn't feel ill.   2/8- has UTI- denied Sx's of UTI, however has incontinence- Keflex 500 mg TID- will wait for Cx results.    LOS: 4 days A FACE TO FACE EVALUATION WAS PERFORMED  Randall Mccormick 08/01/2020, 8:36 AM

## 2020-08-01 NOTE — Progress Notes (Signed)
Pt refused suppository and digital stimulation this evening. Educated on purpose of bowel program   Randall Stack, LPN

## 2020-08-01 NOTE — Progress Notes (Signed)
Occupational Therapy Session Note  Patient Details  Name: Randall Mccormick MRN: [id number] Date of Birth: Oct 15, 1955  Today's Date: 08/01/2020 OT Individual Time: 1345-1430 OT Individual Time Calculation (min): 45 min    Short Term Goals: Week 1:  OT Short Term Goal 1 (Week 1): Pt will complete BSC transfer with LRAD and 2 assist OT Short Term Goal 2 (Week 1): Pt will complete UB dressing with Mod A OT Short Term Goal 3 (Week 1): Pt will complete 1 grooming task seated EOB with no more than Mod balance assistance  Skilled Therapeutic Interventions/Progress Updates:    Pt resting in bed upon arrival and agreeable to therapy. Pt required tot A for donning pants at bed level. Rolling R<>L using bed rails with min A. Supine>sit EOB with assistance to move RLE off EOB. Pt able to scoot forward so BLE can touch floor. OT intervention sitting EOB with focus on kicking beach ball 3x8 and catching/tossing beach ball 3x8. Pt with slight posterior bias when sitting EOB but able to self correct with min verbal cues. Discussed home arrangement for bathing/toileting. Pt required tot A+2 for sit>supine in bed. Tot A+2 for repositioning. Pt remained in bed with all needs within reach and bed alarm activated.   Therapy Documentation Precautions:  Precautions Precautions: Back,Fall Precaution Comments: paraparesis Required Braces or Orthoses: Spinal Brace Spinal Brace: Other (comment) Spinal Brace Comments: Figure 8 Brace Restrictions Weight Bearing Restrictions: No Pain: Pain Assessment Pain Scale: 0-10 Pain Score: 6  Pain Type: Chronic pain Pain Location: Back Pain Orientation: Mid;Lower Pain Descriptors / Indicators: Sharp Pain Frequency: Constant Pain Intervention(s):Meds admin prior to therapy; repositioned    Therapy/Group: Individual Therapy  Leroy Libman 08/01/2020, 2:43 PM

## 2020-08-01 NOTE — Progress Notes (Signed)
Occupational Therapy Session Note  Patient Details  Name: Randall Mccormick MRN: [id number] Date of Birth: [birth year]  Today's Date: 08/01/2020 OT Individual Time: 7615-1834 OT Individual Time Calculation (min): 29 min    Short Term Goals: Week 1:  OT Short Term Goal 1 (Week 1): Pt will complete BSC transfer with LRAD and 2 assist OT Short Term Goal 2 (Week 1): Pt will complete UB dressing with Mod A OT Short Term Goal 3 (Week 1): Pt will complete 1 grooming task seated EOB with no more than Mod balance assistance  Skilled Therapeutic Interventions/Progress Updates:    Patient in bed, alert - he notes mild pain in back and generalized fatigue.  He requests bed level activities at this time.  Completed repositioning, bilateral legs AROM/AAROM and stretch with relief, trunk mobility activities - good carryover of back safety.  Tricep exercises with theraband.  He remained in bed at close of session.  Bed alarm set and call bell in reach.    Therapy Documentation Precautions:  Precautions Precautions: Back,Fall Precaution Comments: paraparesis Required Braces or Orthoses: Spinal Brace Spinal Brace: Other (comment) Spinal Brace Comments: Figure 8 Brace Restrictions Weight Bearing Restrictions: No  Therapy/Group: Individual Therapy  Carlos Levering 08/01/2020, 7:53 AM

## 2020-08-02 DIAGNOSIS — M5104 Intervertebral disc disorders with myelopathy, thoracic region: Secondary | ICD-10-CM | POA: Diagnosis not present

## 2020-08-02 LAB — URINE CULTURE: Culture: >=100000 — AB

## 2020-08-02 LAB — CBC WITH DIFFERENTIAL/PLATELET
Abs Immature Granulocytes: 0.26 10*3/uL — ABNORMAL HIGH (ref 0.00–0.07)
Basophils Absolute: 0.1 10*3/uL (ref 0.0–0.1)
Basophils Relative: 1 %
Eosinophils Absolute: 0.7 10*3/uL — ABNORMAL HIGH (ref 0.0–0.5)
Eosinophils Relative: 5 %
HCT: 37.8 % — ABNORMAL LOW (ref 39.0–52.0)
Hemoglobin: 11.7 g/dL — ABNORMAL LOW (ref 13.0–17.0)
Immature Granulocytes: 2 %
Lymphocytes Relative: 14 %
Lymphs Abs: 2 10*3/uL (ref 0.7–4.0)
MCH: 28.8 pg (ref 26.0–34.0)
MCHC: 31 g/dL (ref 30.0–36.0)
MCV: 93.1 fL (ref 80.0–100.0)
Monocytes Absolute: 1.2 10*3/uL — ABNORMAL HIGH (ref 0.1–1.0)
Monocytes Relative: 8 %
Neutro Abs: 10.4 10*3/uL — ABNORMAL HIGH (ref 1.7–7.7)
Neutrophils Relative %: 70 %
Platelets: 491 10*3/uL — ABNORMAL HIGH (ref 150–400)
RBC: 4.06 MIL/uL — ABNORMAL LOW (ref 4.22–5.81)
RDW: 14.9 % (ref 11.5–15.5)
WBC: 14.6 10*3/uL — ABNORMAL HIGH (ref 4.0–10.5)
nRBC: 0 % (ref 0.0–0.2)

## 2020-08-02 LAB — COMPREHENSIVE METABOLIC PANEL
ALT: 47 U/L — ABNORMAL HIGH (ref 0–44)
AST: 30 U/L (ref 15–41)
Albumin: 2.8 g/dL — ABNORMAL LOW (ref 3.5–5.0)
Alkaline Phosphatase: 138 U/L — ABNORMAL HIGH (ref 38–126)
Anion gap: 10 (ref 5–15)
BUN: 12 mg/dL (ref 8–23)
CO2: 26 mmol/L (ref 22–32)
Calcium: 8.8 mg/dL — ABNORMAL LOW (ref 8.9–10.3)
Chloride: 99 mmol/L (ref 98–111)
Creatinine, Ser: 0.68 mg/dL (ref 0.61–1.24)
GFR, Estimated: >60 mL/min (ref >60)
Glucose, Bld: 106 mg/dL — ABNORMAL HIGH (ref 70–99)
Potassium: 4 mmol/L (ref 3.5–5.1)
Sodium: 135 mmol/L (ref 135–145)
Total Bilirubin: 0.7 mg/dL (ref 0.3–1.2)
Total Protein: 6.3 g/dL — ABNORMAL LOW (ref 6.5–8.1)

## 2020-08-02 NOTE — Progress Notes (Signed)
Physical Therapy Session Note  Patient Details  Name: Randall Mccormick MRN: [id number] Date of Birth: [birth year]  Today's Date: 08/02/2020 PT Individual Time: 0800-0900, 1300-1410 PT Individual Time Calculation (min): 60 min, 70 min  Short Term Goals: Week 1:  PT Short Term Goal 1 (Week 1): Pt will be max A for transfers with LRAD PT Short Term Goal 2 (Week 1): Pt will be able to maintain sitting balance with mod A for 5 min PT Short Term Goal 3 (Week 1): Pt will be able to propel wc 50 ft with supervision.  Skilled Therapeutic Interventions/Progress Updates:    AM session: Pt reclined in bed on arrival and agreeable to therapy. Pt states he is having 6/10 pain in his back at rest. Pain addressed with frequent rest breaks and positioning, nursing administered pain medication during session. Min A rolling L and R for bed level brief change. Dependent for hygiene and donning pants. Supine>EOB mod A for trunk stability. Pt able to sit EOB while donning figure 8 brace with assist to fasten in back and to don shirt independently with only minimal LOB. Clarise Cruz plus transfer EOB>WC. Pt performed oral care and grooming at sink independently while seated in wc. Pt propelled wc with BUE over 500 ft with frequent rest breaks, every 100-150 ft. Pt demonstrates better control of wc after removing cushion to make it easier to reach push rims. Pt returned to bed after session using sara plus, mod A for supine>sit for LE management. Pt left with all needs in reach and bed alarm active.   PM session: Pt supine in bed on arrival and agreeable to therapy, but states he continues to have pain in his back. Pain addressed with rest breaks and positioning throughout session. Supine<>sit mod A for LE management and trunk support. Clarise Cruz plus transfer to wc. Pt propelled chair with BUE x 100 ft before becoming fatigued, transported the rest of the way to gym. Pt performed 4 bouts of 30 sec on the kinetron at 50 cm/sec. Pt instructed  and performed slideboard transfer wc<>mat table with max A from behind and CGA from the front for safety. Pt had difficulty pushing through arms due to body habitus. Pt returned to bed after session via sara plus and was left with all needs in reach and bed alarm active.    Therapy Documentation Precautions:  Precautions Precautions: Back,Fall Precaution Comments: paraparesis Required Braces or Orthoses: Spinal Brace Spinal Brace: Other (comment) Spinal Brace Comments: Figure 8 Brace Restrictions Weight Bearing Restrictions: No    Therapy/Group: Individual Therapy  Sharen Counter, SPT 08/02/2020, 12:23 PM

## 2020-08-02 NOTE — Progress Notes (Signed)
Patient ID: Randall Mccormick, male   DOB: May 07, 1956, 65 y.o.   MRN: [id number]  SW spoke with pt wife Randall Mccormick [phone]) to provide updates from team conference, and ELOS 4 weeks. When discussing with wife what pt will need to do to be at home, she reported pt will need to be more self-sufficient, which is a discussion they have had. She would like to know if he will be able to use a RW at d/c. SW informed medical team could make a better decision, and we should have more information within 2 weeks. Pt aware SW will share her concerns about pt being more self-sufficient at home and being able to perform his own self-care needs.   SW shared her above concerns with medical team. Recommendations for a family meeting. Family meeting scheduled for 2/17 at 11am.   SW updated pt on above.   Loralee Pacas, MSW, [place] Office: [phone] Cell: [phone] Fax: [phone]

## 2020-08-02 NOTE — Progress Notes (Signed)
Occupational Therapy Session Note  Patient Details  Name: Randall Mccormick MRN: [id number] Date of Birth: 05-02-1956  Today's Date: 08/02/2020 OT Individual Time: 1100-1155 OT Individual Time Calculation (min): 55 min    Short Term Goals: Week 1:  OT Short Term Goal 1 (Week 1): Pt will complete BSC transfer with LRAD and 2 assist OT Short Term Goal 2 (Week 1): Pt will complete UB dressing with Mod A OT Short Term Goal 3 (Week 1): Pt will complete 1 grooming task seated EOB with no more than Mod balance assistance  Skilled Therapeutic Interventions/Progress Updates:    Pt resting in bed upon arrival. Supine>sit using bed functions with supervision. Sitting balance EOB with supervision. Transfer to w/c with [place]. Grooming at sink with supervision. W/c propulsion to ortho gym with supervision and extra time. BUE therex on arm bike-7 mins x 2 level 4 in opposite directions. Pt also engaged in sitting balance activities at [employer]. Focus on activity tolerance and BUE strengthening, in addition to activity tolerance. Pt propelled w/c back to room and remained in w/c with all needs within reach.   Therapy Documentation Precautions:  Precautions Precautions: Back,Fall Precaution Comments: paraparesis Required Braces or Orthoses: Spinal Brace Spinal Brace: Other (comment) Spinal Brace Comments: Figure 8 Brace Restrictions Weight Bearing Restrictions: No  Pain: Pain Assessment Pain Scale: 0-10 Pain Score: 4  Pain Type: Chronic pain Pain Location: Back Pain Orientation: Lower  Therapy/Group: Individual Therapy  Leroy Libman 08/02/2020, 12:00 PM

## 2020-08-02 NOTE — Progress Notes (Signed)
[place] PHYSICAL MEDICINE & REHABILITATION PROGRESS NOTE  Subjective/Complaints:  Pt having a LOT of back pain this morning- more than normal. - also more sustained than normal.  Back not itching any more.   Refused bowel program x5+ days- educated that NEEDS this or will  End up spending 1-2 days having BMs due to being backed up- trying to avoid what occurred at [facility]- not have it occur again.   Pt said "willing to try it".      ROS:   Pt denies SOB, abd pain, CP, N/V/C/D, and vision changes    Objective: Vital Signs: Blood pressure 126/83, pulse 89, temperature (!) 97.4 F (36.3 C), resp. rate 18, height 5\' 8"  (1.727 m), weight 103.6 kg, SpO2 91 %. DG Chest 2 View  Result Date: 07/31/2020 CLINICAL DATA:  Leukocytosis. EXAM: CHEST - 2 VIEW COMPARISON:  April 21, 2013. FINDINGS: The heart size and mediastinal contours are within normal limits. Both lungs are clear. The visualized skeletal structures are unremarkable. IMPRESSION: No active cardiopulmonary disease. Electronically Signed   By: Marijo Conception M.D.   On: 07/31/2020 12:21   Recent Labs    07/31/20 0608 08/02/20 0507  WBC 17.7* 14.6*  HGB 12.2* 11.7*  HCT 38.5* 37.8*  PLT 447* 491*   Recent Labs    07/31/20 0608 08/02/20 0507  NA 135 135  K 3.9 4.0  CL 100 99  CO2 26 26  GLUCOSE 113* 106*  BUN 12 12  CREATININE 0.81 0.68  CALCIUM 8.9 8.8*    Intake/Output Summary (Last 24 hours) at 08/02/2020 0846 Last data filed at 08/02/2020 0636 Gross per 24 hour  Intake 777 ml  Output 800 ml  Net -23 ml        Physical Exam: BP 126/83 (BP Location: Left Arm)   Pulse 89   Temp (!) 97.4 F (36.3 C)   Resp 18   Ht 5\' 8"  (1.727 m)   Wt 103.6 kg   SpO2 91%   BMI 34.73 kg/m  Constitutional: awake, alert, laying supine in bed watching TV, NAD HENT: Normocephalic.  Atraumatic. Eyes: EOMI. No discharge. Cardiovascular: no JVD, RRR Respiratory: CTA B/L- no W/R/R- good air movement GI: Soft, NT, ND,  (+)BS  Skin: Warm and dry.   Back incision almost healed- less redness/less rash/less intense color- a little puffy in midline back upper/mid thoracic- rash less red/erythematous.  Psych: appropriate- but some misconceptions Musc: No edema in extremities.  No tenderness in extremities. Neuro: Alert Motor: Bilateral upper extremities: 5/5  Right lower extremity: Hip flexion, knee extension 2+/5, ankle dorsiflexion 4-/5, improving Left lower extremity: Hip flexion, knee extension 2/5, ankle dorsiflexion 4 -/5, improving Sensation diminished to light touch bilateral lower extremities   Assessment/Plan: 1. Functional deficits which require 3+ hours per day of interdisciplinary therapy in a comprehensive inpatient rehab setting.  Physiatrist is providing close team supervision and 24 hour management of active medical problems listed below.  Physiatrist and rehab team continue to assess barriers to discharge/monitor patient progress toward functional and medical goals   Care Tool:  Bathing    Body parts bathed by patient: Right arm,Left arm,Chest,Abdomen,Face   Body parts bathed by helper: Front perineal area (declined washing lower body today)     Bathing assist Assist Level: Moderate Assistance - Patient 50 - 74% (note pt only washed UB and periarea)     Upper Body Dressing/Undressing Upper body dressing   What is the patient wearing?: Pull over shirt  Upper body assist Assist Level: Moderate Assistance - Patient 50 - 74%    Lower Body Dressing/Undressing Lower body dressing      What is the patient wearing?: Incontinence brief,Pants     Lower body assist Assist for lower body dressing: Total Assistance - Patient < 25%     Toileting Toileting    Toileting assist Assist for toileting: Total Assistance - Patient < 25% (urinal, bed level)     Transfers Chair/bed transfer  Transfers assist     Chair/bed transfer assist level: Dependent - mechanical lift      Locomotion Ambulation   Ambulation assist   Ambulation activity did not occur: Safety/medical concerns          Walk 10 feet activity   Assist  Walk 10 feet activity did not occur: Safety/medical concerns        Walk 50 feet activity   Assist Walk 50 feet with 2 turns activity did not occur: Safety/medical concerns         Walk 150 feet activity   Assist Walk 150 feet activity did not occur: Safety/medical concerns         Walk 10 feet on uneven surface  activity   Assist Walk 10 feet on uneven surfaces activity did not occur: Safety/medical concerns         Wheelchair     Assist Will patient use wheelchair at discharge?: Yes Type of Wheelchair: Manual    Wheelchair assist level: Minimal Assistance - Patient > 75% Max wheelchair distance: 100'    Wheelchair 50 feet with 2 turns activity    Assist        Assist Level: Minimal Assistance - Patient > 75%   Wheelchair 150 feet activity     Assist      Assist Level: Maximal Assistance - Patient 25 - 49%    Medical Problem List and Plan: 1.  Bilateral lower extremity weakness with sensory deficits, decreased endurance, poor awareness/lacks insight as well as pain affecting ADLs and mobility secondary to thoracic myelopathy with paraparesis.  Continue CIR 2.  Antithrombotics: -DVT/anticoagulation:  Pharmaceutical: Lovenox    Lower extremity Dopplers ordered  2/7- Dopplers (-)- will con't Lovenox             -antiplatelet therapy: N/a 3. Pain Management: Continue oxycodone prn.              --neuropathy BLE and right chest wall-->continue gabapentin 600 mg tid for now.   2/7- will Add Duloxetine 30 mg QHS and decrease Prozac to 20 mg daily since needs help with nerve pain and already at 600 mg TID of gabapentin  2/9- no change in pain, and no side effects or mood getting worse             Monitor with increased exertion 4. H/o anxiety/Mood: LCSW to follow for evaluation and  support.              Prozaac  2/7- decrease to 20 mg daily so has Duloxetine             -antipsychotic agents: On Zyprexa 5. Neuropsych: This patient is capable of making decisions on his own behalf. 6. Skin/Wound Care: Routine pressure relief measures. Hypoallergenic sheets.   Prevalon boots for both heels ordered 7. Fluids/Electrolytes/Nutrition: Monitor I/Os.    8. Neurogenic bladder:   UA unremarkable, urine culture pending  2/7- will recheck U/A and Cx due to incontinence AND leukocytosis  2/8- F/U U/A is strongly (+)  for UTI- started Keflex 500 mg TID- awaiting Cx- will recheck CBC in AM- wait on stopping Flomax.  2/9- U Cx (+) for Ecoli- pansensitive- WBC down to 14k- will con't Keflex 500 mg TID for a total of 7 days.    PVRs  Flomax 9. HTN: Monitor BP   Norvasc  Mildly elevated on 2/6, monitor for trend  2/7- BP 155/90- will give 1 more day and then increase meds as required  2/9- BP 126/80s- con't regimen             Monitor with increased mobility 10. Neurogenic bowel:    KUB reviewed, unremarkable             Miralax daily and dulcolax supp w/dig stim after supper.   2/7- says has control, but requiring bowel program?  2/8- Says still needs dig stim, but says has control- didn't have BM last night with program- but will con't because pt thinks he still needs dig stim.   2/9- refused all bowel program x5+ days- explained to pt why he needs it- should be able to avoid "2 days of stools", but the more he waits, the more chance he will have  A 1-2 day "blowout", which will be due to not emptying gut. He said he would try it.  11.  Hypoalbuminemia  Supplement initiated on 2/5 12.  Transaminitis  LFTs elevated on 2/5, repeat labs on tomorrow  2/7- LFTs 38 and 67- 1 up slightly; 1 down slightly- con't ot monitor 13.  Leukocytosis  WBC 12.1 on 2/5, labs ordered for tomorrow  2/7- WBC 17.7!- will check U/A and Cx again AND CXR since WBC up so much- not on steroids. Afebrile and  says he doesn't feel ill.   2/8- has UTI- denied Sx's of UTI, however has incontinence- Keflex 500 mg TID- will wait for Cx results.   2/9- Cx shows ECOLI- PAN-SENSITIVE- CON'T Keflex  LOS: 5 days A FACE TO FACE EVALUATION WAS PERFORMED  Doralee Kocak 08/02/2020, 8:46 AM

## 2020-08-03 DIAGNOSIS — M5104 Intervertebral disc disorders with myelopathy, thoracic region: Secondary | ICD-10-CM | POA: Diagnosis not present

## 2020-08-03 MED ORDER — MORPHINE SULFATE ER 15 MG PO TBCR
15.0000 mg | EXTENDED_RELEASE_TABLET | Freq: Two times a day (BID) | ORAL | Status: DC
Start: 2020-08-03 — End: 2020-09-01
  Administered 2020-08-03 – 2020-09-01 (×59): 15 mg via ORAL
  Filled 2020-08-03 (×59): qty 1

## 2020-08-03 NOTE — Progress Notes (Signed)
[place] PHYSICAL MEDICINE & REHABILITATION PROGRESS NOTE  Subjective/Complaints:  Pt reports back hurting worse than yesterday- cannot participate in therapy per pt and OT/PT.  Mainly in surgical site/area.   Denies any weakness that's worse or sensory deficits.   Refused bowel program again last night- did have incontinent BM- per chart, had a small BM THEN large BM- incontinent, in spite of pt saying has control.   Spoke with [facility]- Dr Laren Everts- he doesn't feel additional imaging or steroid is warranted- based on strength exam- will add MS Contin 15 mg BID per his request.     ROS:    Pt denies SOB, abd pain, CP, N/V/C/D, and vision changes   Objective: Vital Signs: Blood pressure (!) 143/83, pulse 97, temperature 98.8 F (37.1 C), temperature source Oral, resp. rate 16, height 5\' 8"  (1.727 m), weight 103.6 kg, SpO2 93 %. No results found. Recent Labs    08/02/20 0507  WBC 14.6*  HGB 11.7*  HCT 37.8*  PLT 491*   Recent Labs    08/02/20 0507  NA 135  K 4.0  CL 99  CO2 26  GLUCOSE 106*  BUN 12  CREATININE 0.68  CALCIUM 8.8*    Intake/Output Summary (Last 24 hours) at 08/03/2020 0910 Last data filed at 08/03/2020 0400 Gross per 24 hour  Intake 397 ml  Output 1150 ml  Net -753 ml        Physical Exam: BP (!) 143/83 (BP Location: Left Arm)   Pulse 97   Temp 98.8 F (37.1 C) (Oral)   Resp 16   Ht 5\' 8"  (1.727 m)   Wt 103.6 kg   SpO2 93%   BMI 34.73 kg/m  Constitutional: awake, alert, lackadaisical behavior, c/o back pain; supine in bed, NAD  HENT: Normocephalic.  Atraumatic. Eyes: EOMI. No discharge. Cardiovascular: RRR- no JVD Respiratory: CTA B/L- no W/R/R- good air movement GI: Soft, NT, ND, (+)BS   Skin: Warm and dry.   Back incision completely healed- a little puffy but less than 2 days ago. Rash better Psych: lackadaisical behavior.  Musc: No edema in extremities.  No tenderness in extremities. Neuro: Alert Motor: Bilateral upper  extremities: 5/5  Right lower extremity: Hip flexion, knee extension 2+/5, ankle dorsiflexion 4-/5, improving Left lower extremity: Hip flexion, knee extension 2/5, ankle dorsiflexion 4 -/5, improving Sensation diminished to light touch bilateral lower extremities   Assessment/Plan: 1. Functional deficits which require 3+ hours per day of interdisciplinary therapy in a comprehensive inpatient rehab setting.  Physiatrist is providing close team supervision and 24 hour management of active medical problems listed below.  Physiatrist and rehab team continue to assess barriers to discharge/monitor patient progress toward functional and medical goals   Care Tool:  Bathing    Body parts bathed by patient: Right arm,Left arm,Chest,Abdomen,Face   Body parts bathed by helper: Front perineal area (declined washing lower body today)     Bathing assist Assist Level: Moderate Assistance - Patient 50 - 74% (note pt only washed UB and periarea)     Upper Body Dressing/Undressing Upper body dressing   What is the patient wearing?: Pull over shirt    Upper body assist Assist Level: Moderate Assistance - Patient 50 - 74%    Lower Body Dressing/Undressing Lower body dressing      What is the patient wearing?: Incontinence brief,Pants     Lower body assist Assist for lower body dressing: Total Assistance - Patient < 25%     Toileting  Toileting    Toileting assist Assist for toileting: Total Assistance - Patient < 25% (urinal, bed level)     Transfers Chair/bed transfer  Transfers assist     Chair/bed transfer assist level: Dependent - mechanical lift     Locomotion Ambulation   Ambulation assist   Ambulation activity did not occur: Safety/medical concerns          Walk 10 feet activity   Assist  Walk 10 feet activity did not occur: Safety/medical concerns        Walk 50 feet activity   Assist Walk 50 feet with 2 turns activity did not occur: Safety/medical  concerns         Walk 150 feet activity   Assist Walk 150 feet activity did not occur: Safety/medical concerns         Walk 10 feet on uneven surface  activity   Assist Walk 10 feet on uneven surfaces activity did not occur: Safety/medical concerns         Wheelchair     Assist Will patient use wheelchair at discharge?: Yes Type of Wheelchair: Manual    Wheelchair assist level: Supervision/Verbal cueing Max wheelchair distance: 150    Wheelchair 50 feet with 2 turns activity    Assist        Assist Level: Supervision/Verbal cueing   Wheelchair 150 feet activity     Assist      Assist Level: Supervision/Verbal cueing    Medical Problem List and Plan: 1.  Bilateral lower extremity weakness with sensory deficits, decreased endurance, poor awareness/lacks insight as well as pain affecting ADLs and mobility secondary to thoracic myelopathy with paraparesis.  Continue CIR 2.  Antithrombotics: -DVT/anticoagulation:  Pharmaceutical: Lovenox    Lower extremity Dopplers ordered  2/7- Dopplers (-)- will con't Lovenox             -antiplatelet therapy: N/a 3. Pain Management: Continue oxycodone prn.              --neuropathy BLE and right chest wall-->continue gabapentin 600 mg tid for now.   2/7- will Add Duloxetine 30 mg QHS and decrease Prozac to 20 mg daily since needs help with nerve pain and already at 600 mg TID of gabapentin  2/9- no change in pain, and no side effects or mood getting worse  2/10- no change in weakness/sensation- but pain much worse- will start MS Contin 15 mg BID and maintain other meds per [facility]             Monitor with increased exertion 4. H/o anxiety/Mood: LCSW to follow for evaluation and support.              Prozaac  2/7- decrease to 20 mg daily so has Duloxetine             -antipsychotic agents: On Zyprexa 5. Neuropsych: This patient is capable of making decisions on his own behalf. 6. Skin/Wound Care: Routine  pressure relief measures. Hypoallergenic sheets.   Prevalon boots for both heels ordered 7. Fluids/Electrolytes/Nutrition: Monitor I/Os.    8. Neurogenic bladder:   UA unremarkable, urine culture pending  2/7- will recheck U/A and Cx due to incontinence AND leukocytosis  2/8- F/U U/A is strongly (+) for UTI- started Keflex 500 mg TID- awaiting Cx- will recheck CBC in AM- wait on stopping Flomax.  2/9- U Cx (+) for Ecoli- pansensitive- WBC down to 14k- will con't Keflex 500 mg TID for a total of 7 days.  2/10- labs in AM- CBC-diff and BMP to make sure things normalizing.   PVRs  Flomax 9. HTN: Monitor BP   Norvasc  Mildly elevated on 2/6, monitor for trend  2/7- BP 155/90- will give 1 more day and then increase meds as required  2/9- BP 126/80s- con't regimen             Monitor with increased mobility 10. Neurogenic bowel:    KUB reviewed, unremarkable             Miralax daily and dulcolax supp w/dig stim after supper.   2/7- says has control, but requiring bowel program?  2/8- Says still needs dig stim, but says has control- didn't have BM last night with program- but will con't because pt thinks he still needs dig stim.   2/9- refused all bowel program x5+ days- explained to pt why he needs it- should be able to avoid "2 days of stools", but the more he waits, the more chance he will have  A 1-2 day "blowout", which will be due to not emptying gut. He said he would try it.  2/19- refused bowel program again. Incontinent- doesn't understand can't control bowels.    11.  Hypoalbuminemia  Supplement initiated on 2/5 12.  Transaminitis  LFTs elevated on 2/5, repeat labs on tomorrow  2/7- LFTs 38 and 67- 1 up slightly; 1 down slightly- con't ot monitor  2/10- will check CMP in AM 13.  Leukocytosis  WBC 12.1 on 2/5, labs ordered for tomorrow  2/7- WBC 17.7!- will check U/A and Cx again AND CXR since WBC up so much- not on steroids. Afebrile and says he doesn't feel ill.   2/8- has  UTI- denied Sx's of UTI, however has incontinence- Keflex 500 mg TID- will wait for Cx results.   2/9- Cx shows ECOLI- PAN-SENSITIVE- CON'T Keflex  I spent a total of 35 minutes on care today- 20 minutes trying to reach and speaking with [facility]- Dr Laren Everts- >50% on coordination of care.   LOS: 6 days A FACE TO FACE EVALUATION WAS PERFORMED  Randall Mccormick 08/03/2020, 9:10 AM

## 2020-08-03 NOTE — Progress Notes (Signed)
Pt refused bowel program but had medium sized bowel movement. No other issues to report.

## 2020-08-03 NOTE — Progress Notes (Signed)
Occupational Therapy Session Note  Patient Details  Name: Randall Mccormick MRN: [id number] Date of Birth: [birth year]  Today's Date: 08/03/2020 OT Individual Time: 1345-1430 OT Individual Time Calculation (min): 45 min    Short Term Goals: Week 1:  OT Short Term Goal 1 (Week 1): Pt will complete BSC transfer with LRAD and 2 assist OT Short Term Goal 2 (Week 1): Pt will complete UB dressing with Mod A OT Short Term Goal 3 (Week 1): Pt will complete 1 grooming task seated EOB with no more than Mod balance assistance  Skilled Therapeutic Interventions/Progress Updates:    Pt resting in bed upon arrival. OT intervention with focus on bed mobility, sit<>stand in [place], standing balance in [place], activity tolerance, and safety awareness to increase independence with BADLs. Supine>sit EOB with supervision. Pt dependent for donning shoes. Sit<>stand in [place] with bed elevated with min A. Pt transferred to w/c and practiced sit<>stands and standing in [place]. Sit>stand in [place] with min A. Stand>sit with mod A for descent and to prevent R knee buckling. Pt practiced sit<>stand X 4 with increased assistance required with fatigue. Planned on shower for tomorrow's session. Pt remained in w/c with all needs within reach.   Therapy Documentation Precautions:  Precautions Precautions: Back,Fall Precaution Comments: paraparesis Required Braces or Orthoses: Spinal Brace Spinal Brace: Other (comment) Spinal Brace Comments: Figure 8 Brace Restrictions Weight Bearing Restrictions: No Pain: Pain Assessment Pain Scale: 0-10 Pain Score: 4  Pain Type: Acute pain Pain Location: Back Pain Descriptors / Indicators: Aching Pain Frequency: Constant Pain Onset: On-going Pain Intervention(s):premedicated    Therapy/Group: Individual Therapy  Leroy Libman 08/03/2020, 2:41 PM

## 2020-08-03 NOTE — Progress Notes (Signed)
Physical Therapy Session Note  Patient Details  Name: Randall Mccormick MRN: [id number] Date of Birth: March 14, 1956  Today's Date: 08/03/2020 PT Individual Time: 6378-5885 PT Individual Time Calculation (min): 63 min   Short Term Goals: Week 1:  PT Short Term Goal 1 (Week 1): Pt will be max A for transfers with LRAD PT Short Term Goal 2 (Week 1): Pt will be able to maintain sitting balance with mod A for 5 min PT Short Term Goal 3 (Week 1): Pt will be able to propel wc 50 ft with supervision.  Skilled Therapeutic Interventions/Progress Updates: Pt presented in w/c agreeable to therapy. Pt states pain in back 5/10 and medicated, no additional intervention required. Provided rest breaks as needed for fatigue and pain management. Pt propelled to ortho gym ~182f with supervision and increased time due to frequent R drift. Discussed fair tolerance to SCharlaine Daltonstands today and agreeable to try additional stands with possible change in [place]. Pt was able to perform STS from w/c and from elevated mat with minA. During stand on elevated mat mirror used and facilitation at hips to improve erect posture and decrease heavy use of BUE. Pt stood from [place] x 2 requiring modA due to fatigue and increased facilitation at hips. Pt was able to tolerate standing frame x 3 min for wt bearing through BLE with pt able to perform TKE with LLE only. Pt expressed significant fatigue after standing frame therefore pt transported back to room. Performed SClarise Cruztransfer back to bed and required modA for sit to supine transfer. Pt was able to laterally shift hips to improve positioning. Pt left in bed at end of session with bed alarm on, call bell within reach and needs met.      Therapy Documentation Precautions:  Precautions Precautions: Back,Fall Precaution Comments: paraparesis Required Braces or Orthoses: Spinal Brace Spinal Brace: Other (comment) Spinal Brace Comments: Figure  8 Brace Restrictions Weight Bearing Restrictions: No General:   Vital Signs: Therapy Vitals Temp: 98.1 F (36.7 C) Pulse Rate: (!) 102 Resp: 18 BP: 123/77 Patient Position (if appropriate): Sitting Oxygen Therapy SpO2: 96 % O2 Device: Room Air Pain: Pain Assessment Pain Scale: 0-10 Pain Score: 4  Pain Type: Acute pain Pain Location: Back Pain Descriptors / Indicators: Aching Pain Frequency: Constant Pain Onset: On-going Pain Intervention(s): Medication (See eMAR)  Therapy/Group: Individual Therapy  Eleanora Guinyard  [place], PTA  08/03/2020, 4:13 PM

## 2020-08-03 NOTE — Plan of Care (Signed)
  Problem: SCI BOWEL ELIMINATION Goal: RH STG MANAGE BOWEL WITH ASSISTANCE Description: STG Manage Bowel with min Assistance. Outcome: Not Progressing; bowel program   Problem: SCI BLADDER ELIMINATION Goal: RH STG SCI MANAGE BLADDER PROGRAM W/ASSISTANCE Description: Manage bladder program with min assist Outcome: Not Progressing; condom cath at hs; bladder scan   Problem: RH PAIN MANAGEMENT Goal: RH STG PAIN MANAGED AT OR BELOW PT'S PAIN GOAL Description: Pain level less than 4 on scale of 0-10 Outcome: Not Progressing; new pain med ; morphine started today

## 2020-08-03 NOTE — Progress Notes (Signed)
Patient refused dulcolax supp. BM last night per report.

## 2020-08-03 NOTE — Evaluation (Signed)
Recreational Therapy Assessment and Plan  Patient Details  Name: Randall Mccormick MRN: [id number] Date of Birth: 1956/04/11 Today's Date: 08/03/2020  Rehab Potential:  good ELOS:   4 weeks  Assessment  Hospital Problem: Principal Problem:   Thoracic disc disease with myelopathy Active Problems:   Constipation   Neurogenic bowel   Neurogenic bladder   Neuropathic pain   Chronic pain syndrome   Leukocytosis   Transaminitis   Hypoalbuminemia due to protein-calorie malnutrition [facility])   Past Medical History:      Past Medical History:  Diagnosis Date  . Anxiety   . Cobalamin deficiency   . Degeneration of lumbar or lumbosacral intervertebral disc   . Depression   . Gouty arthropathy, unspecified   . Hemorrhoids   . Hyperlipidemia   . Hypertension   . Multiple fractures of ribs of left side 04/20/13   FALL FROM A LADDER ON   . Vitamin D deficiency    Past Surgical History:  Past Surgical History:  Procedure Laterality Date  . APPENDECTOMY    . CHOLECYSTECTOMY  2010   [facility]  . TONSILLECTOMY      Assessment & Plan Clinical Impression: Randall Mccormick is a 65 year old male with history of HTN, Vitamin D deficiency, gouty arthritis, anxiety d/o, DDD with multiple back surgeries, LBP with weakness s/p spinal cord stimulator and multiple lumbar surgery--last T10- pelvis decompressive fusion on 11/23/19 with [facility] CIR stay, multiple falls since d/c to home with progressive weakness and BLE numbness from hips down.He started declining around October and had to start using his walker again.History from chart review and patient.He was [facility] to [facility] on[facility]/20[facility] after a fall in the shower, with reported incontinence. Work-up revealed severe flattening of the thecal sac with severe segmental spinalstenosisfrom T8-T10and epidural thickeningwith fibrosis/disc bulge. He underwentposterior thoracic fusionwith laminectomy and extension of arthrodesis  to T8.   Hospital course complicated by postop bilateral lower extremity paraplegiaas well as hypotension treated withRiluzole,fluid bolus,plasma as well as pressors to Keep MAPs >85. He started having improvement in BLE movement on 1/20/2022and therapy has been ongoing. Bowel program augmented to manage constipationand wound VAC discontinued 02/03. He has had difficulty voiding requiring I/O caths after which condom cath was placed to help manage "incontinence".On Riluzole for SCI-->to complete13-day courseon 02/09. Patient with resultant bilateral lower extremity weakness with sensory deficits, decreased endurance,has poor awareness/lacks insight into deficits as well as pain affecting ADLs and mobility. CIR recommended due to functional decline. Patient transferred to CIR on 07/28/2020 .   Pt presents with decreased activity tolerance, decreased functional mobility, decreased balance, decreased coordination Limiting pt's independence with leisure/community pursuits.    Plan  Min 1 TR session >20 minutes per week during LOS  Recommendations for other services: None   Discharge Criteria: Patient will be discharged from TR if patient refuses treatment 3 consecutive times without medical reason.  If treatment goals not met, if there is a change in medical status, if patient makes no progress towards goals or if patient is discharged from hospital.  The above assessment, treatment plan, treatment alternatives and goals were discussed and mutually agreed upon: by patient  [place] 08/03/2020, 12:15 PM

## 2020-08-03 NOTE — Progress Notes (Signed)
Occupational Therapy Session Note  Patient Details  Name: Randall Mccormick MRN: [id number] Date of Birth: May 03, 1956  Today's Date: 08/03/2020 OT Individual Time: 1130-1200 OT Individual Time Calculation (min): 30 min    Short Term Goals: Week 1:  OT Short Term Goal 1 (Week 1): Pt will complete BSC transfer with LRAD and 2 assist OT Short Term Goal 2 (Week 1): Pt will complete UB dressing with Mod A OT Short Term Goal 3 (Week 1): Pt will complete 1 grooming task seated EOB with no more than Mod balance assistance  Skilled Therapeutic Interventions/Progress Updates:    OT intervention with focus on w/c mobility and BUE therex. W/c mobility to ortho gym with supervision. BUE therex on [employer]-random level 3 2x7 mins in opposite directions. Pt propelled w/c back to room. Pt remained in room with all needs within reach.  Therapy Documentation Precautions:  Precautions Precautions: Back,Fall Precaution Comments: paraparesis Required Braces or Orthoses: Spinal Brace Spinal Brace: Other (comment) Spinal Brace Comments: Figure 8 Brace Restrictions Weight Bearing Restrictions: No  Pain:  Pt c/o 6/10 back pain; premedicated  Therapy/Group: Individual Therapy  Leroy Libman 08/03/2020, 12:11 PM

## 2020-08-03 NOTE — Progress Notes (Signed)
Physical Therapy Session Note  Patient Details  Name: Randall Mccormick MRN: [id number] Date of Birth: 01-27-1956  Today's Date: 08/03/2020 PT Individual Time: 0930-1000 PT Individual Time Calculation (min): 30 min   Short Term Goals: Week 1:  PT Short Term Goal 1 (Week 1): Pt will be max A for transfers with LRAD PT Short Term Goal 2 (Week 1): Pt will be able to maintain sitting balance with mod A for 5 min PT Short Term Goal 3 (Week 1): Pt will be able to propel wc 50 ft with supervision. Week 2:    Week 3:     Skilled Therapeutic Interventions/Progress Updates:    PAIN Pt initially seated mid set up w/nursing w/Sarah +.  Pt brought to standing and nursing provided total assist w/pericare.  Therapist assisted NT w/use of Sarah + and process for removal of sling.    wc skills: Pt propelled wc 136ft w/bilat UEs  Reviewed efficient stroke technique, importance of protecting shoulders w/wc use.  Educated pt on specialized wc options if needed at [employer] ([employer], axle position, etc.) Pt performed LE AROM ankle pumps, TKEs  Standing: Sit to stand in parallel bars w/max assist of 1, cues to increase ant wt shift w/transition.  Therapist blocking bilat knees and verbal cues for pt to activate quads RLE/extend knees.  Cues for UE positioning on bars to facilitate upright posture.  Pt stood 1-1.5 min, used mirror for feedback, very pleased w/having achieved full standing today. Pt rested in sitting then second attempt pt unable to achieve standing due to fatigue.  wc propulsion x 141ft w/bilat Ues, cues for technique.   Pt left oob in wc w/alarm belt set and needs in reach  Therapy Documentation Precautions:  Precautions Precautions: Back,Fall Precaution Comments: paraparesis Required Braces or Orthoses: Spinal Brace Spinal Brace: Other (comment) Spinal Brace Comments: Figure 8 Brace Restrictions Weight Bearing Restrictions: No    Therapy/Group: Individual Therapy  Callie Fielding, [place] 08/03/2020, 12:31 PM

## 2020-08-03 NOTE — Progress Notes (Signed)
Occupational Therapy Session Note  Patient Details  Name: Randall Mccormick MRN: [id number] Date of Birth: 11-07-1955  Today's Date: 08/03/2020 OT Individual Time: 4782-9562 OT Individual Time Calculation (min): 43 min    Short Term Goals: Week 1:  OT Short Term Goal 1 (Week 1): Pt will complete BSC transfer with LRAD and 2 assist OT Short Term Goal 2 (Week 1): Pt will complete UB dressing with Mod A OT Short Term Goal 3 (Week 1): Pt will complete 1 grooming task seated EOB with no more than Mod balance assistance   Skilled Therapeutic Interventions/Progress Updates:    Pt greeted at time of session supine in bed resting, c/o back pain but no # given, wanting to get up and OOB. Note condom cath still attached, spoke with nursing who said ok to remove. Removed condom cath, brief noted to be soiled with urine, dependent brief change at bed level with pt rolling Min A L/R.  Donned pants total A with pt declining to assist d/t back pain and wanting to sit up quickly for relief, donning pants rolling L/R at bed level. Supine > sit Mod/Max A with log roll. Sara lift > wheelchair and set up at sink to perform UB bathe/dress Min A to fully pull down shirt in the back. Oral hygiene set up as well. Back pain improved sitting up in wheelchair compared to bed level. Up in chair with alarm on call bell in reach.   Therapy Documentation Precautions:  Precautions Precautions: Back,Fall Precaution Comments: paraparesis Required Braces or Orthoses: Spinal Brace Spinal Brace: Other (comment) Spinal Brace Comments: Figure 8 Brace Restrictions Weight Bearing Restrictions: No     Therapy/Group: Individual Therapy  Viona Gilmore 08/03/2020, 8:49 AM

## 2020-08-04 DIAGNOSIS — M5104 Intervertebral disc disorders with myelopathy, thoracic region: Secondary | ICD-10-CM | POA: Diagnosis not present

## 2020-08-04 LAB — CBC WITH DIFFERENTIAL/PLATELET
Abs Immature Granulocytes: 0.22 10*3/uL — ABNORMAL HIGH (ref 0.00–0.07)
Basophils Absolute: 0.1 10*3/uL (ref 0.0–0.1)
Basophils Relative: 1 %
Eosinophils Absolute: 0.5 10*3/uL (ref 0.0–0.5)
Eosinophils Relative: 4 %
HCT: 37.4 % — ABNORMAL LOW (ref 39.0–52.0)
Hemoglobin: 11.8 g/dL — ABNORMAL LOW (ref 13.0–17.0)
Immature Granulocytes: 2 %
Lymphocytes Relative: 21 %
Lymphs Abs: 2.5 10*3/uL (ref 0.7–4.0)
MCH: 29.6 pg (ref 26.0–34.0)
MCHC: 31.6 g/dL (ref 30.0–36.0)
MCV: 93.7 fL (ref 80.0–100.0)
Monocytes Absolute: 1.3 10*3/uL — ABNORMAL HIGH (ref 0.1–1.0)
Monocytes Relative: 11 %
Neutro Abs: 7.3 10*3/uL (ref 1.7–7.7)
Neutrophils Relative %: 61 %
Platelets: 490 10*3/uL — ABNORMAL HIGH (ref 150–400)
RBC: 3.99 MIL/uL — ABNORMAL LOW (ref 4.22–5.81)
RDW: 14.7 % (ref 11.5–15.5)
WBC: 11.9 10*3/uL — ABNORMAL HIGH (ref 4.0–10.5)
nRBC: 0 % (ref 0.0–0.2)

## 2020-08-04 LAB — COMPREHENSIVE METABOLIC PANEL
ALT: 41 U/L (ref 0–44)
AST: 25 U/L (ref 15–41)
Albumin: 2.8 g/dL — ABNORMAL LOW (ref 3.5–5.0)
Alkaline Phosphatase: 131 U/L — ABNORMAL HIGH (ref 38–126)
Anion gap: 9 (ref 5–15)
BUN: 12 mg/dL (ref 8–23)
CO2: 29 mmol/L (ref 22–32)
Calcium: 8.9 mg/dL (ref 8.9–10.3)
Chloride: 97 mmol/L — ABNORMAL LOW (ref 98–111)
Creatinine, Ser: 0.78 mg/dL (ref 0.61–1.24)
GFR, Estimated: >60 mL/min (ref >60)
Glucose, Bld: 108 mg/dL — ABNORMAL HIGH (ref 70–99)
Potassium: 4.1 mmol/L (ref 3.5–5.1)
Sodium: 135 mmol/L (ref 135–145)
Total Bilirubin: 0.6 mg/dL (ref 0.3–1.2)
Total Protein: 6.1 g/dL — ABNORMAL LOW (ref 6.5–8.1)

## 2020-08-04 NOTE — Progress Notes (Signed)
Occupational Therapy Weekly Progress Note  Patient Details  Name: Randall Mccormick MRN: [id number] Date of Birth: 03/28/1956  Beginning of progress report period: July 29, 2020 End of progress report period: August 04, 2020  Patient has met 2 of 3 short term goals.  Pt is making slow but steady progress. Pt now uses [employer] for sit<>stand and transfers vs [employer]. Pt currently completing bathing/dressing tasks at w/c level with mod A for bathing, mod A for UB dressing, and max A for LB dressing with max A. Sit<>stand in [place] with min A. Sitting balance EOB with CGA.   Patient continues to demonstrate the following deficits: muscle weakness, decreased cardiorespiratoy endurance, impaired timing and sequencing, unbalanced muscle activation and decreased coordination and decreased sitting balance, decreased standing balance, decreased postural control and decreased balance strategies and therefore will continue to benefit from skilled OT intervention to enhance overall performance with BADL and Reduce care partner burden.  Patient progressing toward long term goals..  Continue plan of care.  OT Short Term Goals Week 1:  OT Short Term Goal 1 (Week 1): Pt will complete BSC transfer with LRAD and 2 assist OT Short Term Goal 1 - Progress (Week 1): Progressing toward goal OT Short Term Goal 2 (Week 1): Pt will complete UB dressing with Mod A OT Short Term Goal 2 - Progress (Week 1): Met OT Short Term Goal 3 (Week 1): Pt will complete 1 grooming task seated EOB with no more than Mod balance assistance OT Short Term Goal 3 - Progress (Week 1): Met Week 2:  OT Short Term Goal 1 (Week 2): Pt will complete BSC transfer with LRAD and 2 assist OT Short Term Goal 2 (Week 2): Pt will complete LB dressing with mod A using AE PRN OT Short Term Goal 3 (Week 2): Pt will complete toileting tasks with max A OT Short Term Goal 4 (Week 2): Pt will perform UB dressing tasks with min A     Leroy Libman 08/04/2020, 6:39 AM

## 2020-08-04 NOTE — Progress Notes (Signed)
Physical Therapy Session Note  Patient Details  Name: Randall Mccormick MRN: [id number] Date of Birth: March 25, 1956  Today's Date: 08/04/2020 PT Individual Time: 0805-0900 PT Individual Time Calculation (min): 55 min   Short Term Goals: Week 1:  PT Short Term Goal 1 (Week 1): Pt will be max A for transfers with LRAD PT Short Term Goal 2 (Week 1): Pt will be able to maintain sitting balance with mod A for 5 min PT Short Term Goal 3 (Week 1): Pt will be able to propel wc 50 ft with supervision.  Skilled Therapeutic Interventions/Progress Updates: Pt presented in bed agreeable to therapy. Pt states pain initially 4/10 increased with activity. PTA threaded pants total A for energy conservation and performed rolling L/R with bed rail supervision to pull pants over hips. Performed supine to sit with use of bed features with minA. PTA donned shoes total A and pt performed Stedy transfer to w/c with minA from elevated bed. Pt then performed oral hygiene and washed face at sink mod I. Pt propelled to rehab gym for cardiovascular endurance with supervision and increased time/effort. Performed Stedy transfer to high/low at with modA from w/c. Participated in sitting balance activities on unsupported surface performing ball taps with 2lb dowel. Pt required rest break supported by physioball with Lattie Haw, TR present conversing with pt. PTA then raised mat and pt performed ball kicks alternating BLE to fatigue ~3 min ea. Pt was able to perform all activities with CGA for sitting balance as pt would intermittently display increased posterior lean however pt was able to correct with verbal cues. Pt transferred back to to w/c via Stedy and minA from elevated mat. Pt transported back to room at end of session and left with belt alarm on, call bell within reach and needs met.      Therapy Documentation Precautions:  Precautions Precautions: Back,Fall Precaution Comments: paraparesis Required Braces or Orthoses: Spinal  Brace Spinal Brace: Other (comment) Spinal Brace Comments: Figure 8 Brace Restrictions Weight Bearing Restrictions: No General:   Vital Signs:   Pain:   Mobility:   Locomotion :    Trunk/Postural Assessment :    Balance:   Exercises:   Other Treatments:      Therapy/Group: Individual Therapy  Lenni Reckner 08/04/2020, 12:58 PM

## 2020-08-04 NOTE — Progress Notes (Signed)
[facility] PHYSICAL MEDICINE & REHABILITATION PROGRESS NOTE  Subjective/Complaints:  MS Contin helped a lot- for pain- able to stand for first time.  Getting shower today.  Feels like pain now going in the right direction  ROS:    Pt denies SOB, abd pain, CP, N/V/C/D, and vision changes     Objective: Vital Signs: Blood pressure 120/80, pulse 87, temperature 97.7 F (36.5 C), resp. rate 20, height 5\' 8"  (1.727 m), weight 103.6 kg, SpO2 91 %. No results found. Recent Labs    08/02/20 0507 08/04/20 0453  WBC 14.6* 11.9*  HGB 11.7* 11.8*  HCT 37.8* 37.4*  PLT 491* 490*   Recent Labs    08/02/20 0507 08/04/20 0453  NA 135 135  K 4.0 4.1  CL 99 97*  CO2 26 29  GLUCOSE 106* 108*  BUN 12 12  CREATININE 0.68 0.78  CALCIUM 8.8* 8.9    Intake/Output Summary (Last 24 hours) at 08/04/2020 0843 Last data filed at 08/04/2020 0739 Gross per 24 hour  Intake 360 ml  Output 2911 ml  Net -2551 ml        Physical Exam: BP 120/80 (BP Location: Left Arm)   Pulse 87   Temp 97.7 F (36.5 C)   Resp 20   Ht 5\' 8"  (1.727 m)   Wt 103.6 kg   SpO2 91%   BMI 34.73 kg/m  Constitutional: awake, alert, sitting up in bed; NAD HENT: Normocephalic.  Atraumatic. Eyes: EOMI. No discharge. Cardiovascular: RRR- no JVD Respiratory: CTA B/L- no W/R/R- good air movement GI: Soft, NT, ND, (+)BS    Skin: Warm and dry.   Back incision completely healed- a little puffy but less than 2 days ago. Rash better Psych: lackadaisical behavior- still -no change.  Musc: No edema in extremities.  No tenderness in extremities. Neuro: Alert Motor: Bilateral upper extremities: 5/5  Right lower extremity: Hip flexion, knee extension 2+/5, ankle dorsiflexion 4-/5, improving Left lower extremity: Hip flexion, knee extension 2/5, ankle dorsiflexion 4 -/5, improving Sensation diminished to light touch bilateral lower extremities   Assessment/Plan: 1. Functional deficits which require 3+ hours per day  of interdisciplinary therapy in a comprehensive inpatient rehab setting.  Physiatrist is providing close team supervision and 24 hour management of active medical problems listed below.  Physiatrist and rehab team continue to assess barriers to discharge/monitor patient progress toward functional and medical goals   Care Tool:  Bathing    Body parts bathed by patient: Right arm,Left arm,Chest,Abdomen,Face   Body parts bathed by helper: Front perineal area,Buttocks (note declined washing legs/feet today)     Bathing assist Assist Level: Minimal Assistance - Patient > 75% (UB and periarea only, declined LB bathing)     Upper Body Dressing/Undressing Upper body dressing   What is the patient wearing?: Pull over shirt    Upper body assist Assist Level: Minimal Assistance - Patient > 75%    Lower Body Dressing/Undressing Lower body dressing      What is the patient wearing?: Incontinence brief,Pants     Lower body assist Assist for lower body dressing: Total Assistance - Patient < 25%     Toileting Toileting    Toileting assist Assist for toileting: Total Assistance - Patient < 25% (urinal, bed level)     Transfers Chair/bed transfer  Transfers assist     Chair/bed transfer assist level: Dependent - mechanical lift     Locomotion Ambulation   Ambulation assist   Ambulation activity did not occur:  Safety/medical concerns          Walk 10 feet activity   Assist  Walk 10 feet activity did not occur: Safety/medical concerns        Walk 50 feet activity   Assist Walk 50 feet with 2 turns activity did not occur: Safety/medical concerns         Walk 150 feet activity   Assist Walk 150 feet activity did not occur: Safety/medical concerns         Walk 10 feet on uneven surface  activity   Assist Walk 10 feet on uneven surfaces activity did not occur: Safety/medical concerns         Wheelchair     Assist Will patient use  wheelchair at discharge?: Yes Type of Wheelchair: Manual    Wheelchair assist level: Supervision/Verbal cueing Max wheelchair distance: 150    Wheelchair 50 feet with 2 turns activity    Assist        Assist Level: Supervision/Verbal cueing   Wheelchair 150 feet activity     Assist      Assist Level: Supervision/Verbal cueing    Medical Problem List and Plan: 1.  Bilateral lower extremity weakness with sensory deficits, decreased endurance, poor awareness/lacks insight as well as pain affecting ADLs and mobility secondary to thoracic myelopathy with paraparesis.  Continue CIR 2.  Antithrombotics: -DVT/anticoagulation:  Pharmaceutical: Lovenox    Lower extremity Dopplers ordered  2/7- Dopplers (-)- will con't Lovenox             -antiplatelet therapy: N/a 3. Pain Management: Continue oxycodone prn.              --neuropathy BLE and right chest wall-->continue gabapentin 600 mg tid for now.   2/7- will Add Duloxetine 30 mg QHS and decrease Prozac to 20 mg daily since needs help with nerve pain and already at 600 mg TID of gabapentin  2/9- no change in pain, and no side effects or mood getting worse  2/10- no change in weakness/sensation- but pain much worse- will start MS Contin 15 mg BID and maintain other meds per [facility]  2/11- pain doing better per pt- will con't MS Contin at this time             Monitor with increased exertion 4. H/o anxiety/Mood: LCSW to follow for evaluation and support.              Prozaac  2/7- decrease to 20 mg daily so has Duloxetine  2/11- no change in affect             -antipsychotic agents: On Zyprexa 5. Neuropsych: This patient is capable of making decisions on his own behalf. 6. Skin/Wound Care: Routine pressure relief measures. Hypoallergenic sheets.   Prevalon boots for both heels ordered 7. Fluids/Electrolytes/Nutrition: Monitor I/Os.    8. Neurogenic bladder:   UA unremarkable, urine culture pending  2/7- will recheck U/A  and Cx due to incontinence AND leukocytosis  2/8- F/U U/A is strongly (+) for UTI- started Keflex 500 mg TID- awaiting Cx- will recheck CBC in AM- wait on stopping Flomax.  2/9- U Cx (+) for Ecoli- pansensitive- WBC down to 14k- will con't Keflex 500 mg TID for a total of 7 days.    2/10- labs in AM- CBC-diff and BMP to make sure things normalizing.  2/11- CBC shows WBC down to 11k- con't Keflex for 7 days total   PVRs  Flomax 9. HTN: Monitor BP  Norvasc  Mildly elevated on 2/6, monitor for trend  2/11- BP great 120/80 this AM- con't regimen             Monitor with increased mobility 10. Neurogenic bowel:    KUB reviewed, unremarkable             Miralax daily and dulcolax supp w/dig stim after supper.   2/7- says has control, but requiring bowel program?  2/8- Says still needs dig stim, but says has control- didn't have BM last night with program- but will con't because pt thinks he still needs dig stim.   2/9- refused all bowel program x5+ days- explained to pt why he needs it- should be able to avoid "2 days of stools", but the more he waits, the more chance he will have  A 1-2 day "blowout", which will be due to not emptying gut. He said he would try it.  2/10- refused bowel program again. Incontinent- doesn't understand can't control bowels.  2/ 11- still refusing bowel program- will con't to reinforce this is needed to control his stools at particular times- I.e prevent random incontinence 11.  Hypoalbuminemia  Supplement initiated on 2/5 12.  Transaminitis  LFTs elevated on 2/5, repeat labs on tomorrow  2/7- LFTs 38 and 67- 1 up slightly; 1 down slightly- con't ot monitor  2/10- will check CMP in AM  2/11- Now normal- con't regimen/monitor 13.  Leukocytosis  WBC 12.1 on 2/5, labs ordered for tomorrow  2/7- WBC 17.7!- will check U/A and Cx again AND CXR since WBC up so much- not on steroids. Afebrile and says he doesn't feel ill.   2/8- has UTI- denied Sx's of UTI, however has  incontinence- Keflex 500 mg TID- will wait for Cx results.   2/9- Cx shows ECOLI- PAN-SENSITIVE- CON'T Keflex  2/11- Keflex x total of 7 days-     LOS: 7 days A FACE TO FACE EVALUATION WAS PERFORMED  Randall Mccormick 08/04/2020, 8:43 AM

## 2020-08-04 NOTE — Progress Notes (Signed)
Occupational Therapy Session Note  Patient Details  Name: Randall Mccormick MRN: [id number] Date of Birth: [birth year]  Today's Date: 08/04/2020 OT Individual Time:  -       Short Term Goals: Week 2:  OT Short Term Goal 1 (Week 2): Pt will complete BSC transfer with LRAD and 2 assist OT Short Term Goal 2 (Week 2): Pt will complete LB dressing with mod A using AE PRN OT Short Term Goal 3 (Week 2): Pt will complete toileting tasks with max A OT Short Term Goal 4 (Week 2): Pt will perform UB dressing tasks with min A  Skilled Therapeutic Interventions/Progress Updates:    OT intervention with focus on bathing at shower level and dressing with sit<>stand in [place]. All tranfsers with Stedy. Sit<>stand in [place] with min A/CGA. Standing in [place] for approx 15 seconds x 4. UB dressing with supervision. LB dressing with max A. Pt required more then a reasonable amount of time to complete all tasks. RN requested pt return to bed for bladder scan. Sit>supine in bed with mod A for BLE management. Pt remained in bed with RN present.   Therapy Documentation Precautions:  Precautions Precautions: Back,Fall Precaution Comments: paraparesis Required Braces or Orthoses: Spinal Brace Spinal Brace: Other (comment) Spinal Brace Comments: Figure 8 Brace Restrictions Weight Bearing Restrictions: No    Pain:  Pt c/o 4/10 back pain; shower and repositioning  Therapy/Group: Individual Therapy  Leroy Libman 08/04/2020, 12:06 PM

## 2020-08-04 NOTE — Progress Notes (Signed)
Bowel program begun at 6:04 pm suppository given but patient refused digital stim. Bowel movement did not occur on this shift night shift nurse notified about bowel program

## 2020-08-04 NOTE — Progress Notes (Signed)
Physical Therapy Session Note  Patient Details  Name: Randall Mccormick MRN: [id number] Date of Birth: [birth year]  Today's Date: 08/04/2020 PT Individual Time: 8546-2703 PT Individual Time Calculation (min): 20 min   Short Term Goals: Week 1:  PT Short Term Goal 1 (Week 1): Pt will be max A for transfers with LRAD PT Short Term Goal 2 (Week 1): Pt will be able to maintain sitting balance with mod A for 5 min PT Short Term Goal 3 (Week 1): Pt will be able to propel wc 50 ft with supervision.     Skilled Therapeutic Interventions/Progress Updates:    pt received in bed and agreeable to therapy however reported he was very tired and 6/10 pain in back. Pt directed in supine>sit min A and min A scooting to EOB. Pt reported he did not want to attempt to transfer OOB due to back pain and fatigue after being up all day. Pt was agreeable to sitting EOB exercises, x10 marching and ankle pumps however x3 LAQ on RLE completed and pt reported great increase in pain and unable to tolerate sitting EOB politely requested to return to supine, mod A to complete. Pt unable to scoot to [facility] and required additional person to assist, max A x2 to complete. Pt left with nursing to provide medication, All needs in reach and in good condition. Call light in hand.  And alarm set. Pt missed 10 mins.   Therapy Documentation Precautions:  Precautions Precautions: Back,Fall Precaution Comments: paraparesis Required Braces or Orthoses: Spinal Brace Spinal Brace: Other (comment) Spinal Brace Comments: Figure 8 Brace Restrictions Weight Bearing Restrictions: No General: PT Amount of Missed Time (min): 10 Minutes PT Missed Treatment Reason: Pain Vital Signs: Therapy Vitals Pulse Rate: 98 Resp: 16 BP: 129/61 Patient Position (if appropriate): Lying Oxygen Therapy SpO2: 94 % O2 Device: Room Air Pain: Pain Assessment Pain Scale: 0-10 Pain Score: 8  Pain Type: Acute pain Pain Location: Back Pain Orientation:  Mid Pain Descriptors / Indicators: Aching;Stabbing;Shooting Pain Frequency: Constant Pain Onset: On-going Pain Intervention(s): Medication (See eMAR) Mobility:   Locomotion :    Trunk/Postural Assessment :    Balance:   Exercises:   Other Treatments:      Therapy/Group: Individual Therapy  Junie Panning 08/04/2020, 3:40 PM

## 2020-08-04 NOTE — Progress Notes (Signed)
Occupational Therapy Session Note  Patient Details  Name: Izic Stfort MRN: [id number] Date of Birth: 24-Sep-1955  Today's Date: 08/04/2020 OT Individual Time: 1345-1441 OT Individual Time Calculation (min): 56 min    Short Term Goals: Week 2:  OT Short Term Goal 1 (Week 2): Pt will complete BSC transfer with LRAD and 2 assist OT Short Term Goal 2 (Week 2): Pt will complete LB dressing with mod A using AE PRN OT Short Term Goal 3 (Week 2): Pt will complete toileting tasks with max A OT Short Term Goal 4 (Week 2): Pt will perform UB dressing tasks with min A  Skilled Therapeutic Interventions/Progress Updates:    Pt resting in bed upon arrival. Supine>sit EOB with supervision using bed functions. Pt threaded BLE into pants using reacher. Tot A for pulling over hips when standing in [place]. Sit<>stand and transfer to w/c with Stedy. CGA for sit>stand. Pt propelled w/c to ortho gym and engaged in BUE therex on arm bike-2x7 mins random load 4 in opposite directions. Pt propelled w/c back to room and informed therapist that he had urinated. Transfer back to bed with Stedy. Sit>supine with mod A for BLE management. Rolling in bed to remove brief, perform hygiene, don brief and pants. Rolling R/L with min A. Dependent for donning pants.  Therapy Documentation Precautions:  Precautions Precautions: Back,Fall Precaution Comments: paraparesis Required Braces or Orthoses: Spinal Brace Spinal Brace: Other (comment) Spinal Brace Comments: Figure 8 Brace Restrictions Weight Bearing Restrictions: No Pain: Pt c/o 4/10 back pain; repositioned Therapy/Group: Individual Therapy  Leroy Libman 08/04/2020, 2:44 PM

## 2020-08-04 NOTE — Progress Notes (Signed)
Recreational Therapy Session Note  Patient Details  Name: Randall Mccormick MRN: [id number] Date of Birth: [birth year] Today's Date: 08/04/2020  Pain: no c/o Skilled Therapeutic Interventions/Progress Updates: Session focused on activity tolerance, dynamic sitting balance, BLE strengthening during co-treat with PT.  Pt seated EOM for ball tap activity using weighted bar to tap a ball back and for the with therapist with supervision, frequent rest breaks due to fatigue.  Transitioned to sitting EOM on slightly elevated surface for ball kicks alternating feet with close supervision.  During seated rest breaks, continued discussion on travel accessibility including available services, equipment and self advocacy.  Pt stated understanding.  Therapy/Group: Co-Treatment  Jacquelina Hewins 08/04/2020, 10:25 AM

## 2020-08-05 DIAGNOSIS — N319 Neuromuscular dysfunction of bladder, unspecified: Secondary | ICD-10-CM | POA: Diagnosis not present

## 2020-08-05 DIAGNOSIS — M5104 Intervertebral disc disorders with myelopathy, thoracic region: Secondary | ICD-10-CM | POA: Diagnosis not present

## 2020-08-05 DIAGNOSIS — K592 Neurogenic bowel, not elsewhere classified: Secondary | ICD-10-CM | POA: Diagnosis not present

## 2020-08-05 NOTE — Progress Notes (Signed)
RN offered to do digital stimulation per bowel program but pt refused.

## 2020-08-05 NOTE — Progress Notes (Signed)
Bowel program initiated at 1803 suppository given and digital stimulation refused. Patient states it makes them uncomfortable but dig Stim is not painful pt. Refused and educated. Bowel movement did not occur this shift will notify on coming nurse about status of bowel program

## 2020-08-05 NOTE — Progress Notes (Signed)
[place] PHYSICAL MEDICINE & REHABILITATION PROGRESS NOTE  Subjective/Complaints:  Randall Mccormick reports improvement in his pain. Still having some problems emptying his bladder. Refused dig stim last night  ROS: Patient denies fever, rash, sore throat, blurred vision, nausea, vomiting, diarrhea, cough, shortness of breath or chest pain,  headache, or mood change.      Objective: Vital Signs: Blood pressure 119/79, pulse 93, temperature 99.8 F (37.7 C), temperature source Oral, resp. rate 16, height 5\' 8"  (1.727 m), weight 103.6 kg, SpO2 95 %. No results found. Recent Labs    08/04/20 0453  WBC 11.9*  HGB 11.8*  HCT 37.4*  PLT 490*   Recent Labs    08/04/20 0453  NA 135  K 4.1  CL 97*  CO2 29  GLUCOSE 108*  BUN 12  CREATININE 0.78  CALCIUM 8.9    Intake/Output Summary (Last 24 hours) at 08/05/2020 0933 Last data filed at 08/05/2020 0506 Gross per 24 hour  Intake 120 ml  Output 2450 ml  Net -2330 ml        Physical Exam: BP 119/79 (BP Location: Left Arm)   Pulse 93   Temp 99.8 F (37.7 C) (Oral)   Resp 16   Ht 5\' 8"  (1.727 m)   Wt 103.6 kg   SpO2 95%   BMI 34.73 kg/m  Constitutional: No distress . Vital signs reviewed. HEENT: EOMI, oral membranes moist Neck: supple Cardiovascular: RRR without murmur. No JVD    Respiratory/Chest: CTA Bilaterally without wheezes or rales. Normal effort    GI/Abdomen: BS +, non-tender, non-distended Ext: no clubbing, cyanosis, or edema Psych: pleasant and cooperative  Skin: Warm and dry.   Back incision  healed-  Musc: No edema in extremities.  No tenderness in extremities. Neuro: Alert Motor: Bilateral upper extremities: 5/5  Right lower extremity: Hip flexion, knee extension 2+/5, ankle dorsiflexion 4-/5, improving Left lower extremity: Hip flexion, knee extension 2/5, ankle dorsiflexion 4 -/5, improving Sensation diminished sl to light touch bilateral lower extremities    Assessment/Plan: 1. Functional deficits which  require 3+ hours per day of interdisciplinary therapy in a comprehensive inpatient rehab setting.  Physiatrist is providing close team supervision and 24 hour management of active medical problems listed below.  Physiatrist and rehab team continue to assess barriers to discharge/monitor patient progress toward functional and medical goals   Care Tool:  Bathing    Body parts bathed by patient: Right arm,Left arm,Chest,Abdomen,Front perineal area,Right upper leg,Left upper leg,Face   Body parts bathed by helper: Buttocks,Right lower leg,Left lower leg     Bathing assist Assist Level: Minimal Assistance - Patient > 75%     Upper Body Dressing/Undressing Upper body dressing   What is the patient wearing?: Pull over shirt    Upper body assist Assist Level: Supervision/Verbal cueing    Lower Body Dressing/Undressing Lower body dressing      What is the patient wearing?: Incontinence brief,Pants     Lower body assist Assist for lower body dressing: 2 Helpers     Toileting Toileting    Toileting assist Assist for toileting: Total Assistance - Patient < 25% (urinal, bed level)     Transfers Chair/bed transfer  Transfers assist     Chair/bed transfer assist level: Dependent - mechanical lift     Locomotion Ambulation   Ambulation assist   Ambulation activity did not occur: Safety/medical concerns          Walk 10 feet activity   Assist  Walk 10 feet  activity did not occur: Safety/medical concerns        Walk 50 feet activity   Assist Walk 50 feet with 2 turns activity did not occur: Safety/medical concerns         Walk 150 feet activity   Assist Walk 150 feet activity did not occur: Safety/medical concerns         Walk 10 feet on uneven surface  activity   Assist Walk 10 feet on uneven surfaces activity did not occur: Safety/medical concerns         Wheelchair     Assist Will patient use wheelchair at discharge?: Yes Type  of Wheelchair: Manual    Wheelchair assist level: Supervision/Verbal cueing Max wheelchair distance: 150    Wheelchair 50 feet with 2 turns activity    Assist        Assist Level: Supervision/Verbal cueing   Wheelchair 150 feet activity     Assist      Assist Level: Supervision/Verbal cueing    Medical Problem List and Plan: 1.  Bilateral lower extremity weakness with sensory deficits, decreased endurance, poor awareness/lacks insight as well as pain affecting ADLs and mobility secondary to thoracic myelopathy with paraparesis.  Continue CIR 2.  Antithrombotics: -DVT/anticoagulation:  Pharmaceutical: Lovenox    Lower extremity Dopplers ordered  2/7- Dopplers (-)- will con't Lovenox             -antiplatelet therapy: N/a 3. Pain Management: Continue oxycodone prn.              --neuropathy BLE and right chest wall-->continue gabapentin 600 mg tid for now.   2/7- will Add Duloxetine 30 mg QHS and decrease Prozac to 20 mg daily since needs help with nerve pain and already at 600 mg TID of gabapentin  2/9- no change in pain, and no side effects or mood getting worse  2/10- no change in weakness/sensation- but pain much worse- will start MS Contin 15 mg BID and maintain other meds per [facility]  2/11- pain doing better per Randall Mccormick- will con't MS Contin at this time             Monitor with increased exertion 4. H/o anxiety/Mood: LCSW to follow for evaluation and support.              Prozaac  2/7- decrease to 20 mg daily so has Duloxetine  2/11- no change in affect             -antipsychotic agents: On Zyprexa 5. Neuropsych: This patient is capable of making decisions on his own behalf. 6. Skin/Wound Care: Routine pressure relief measures. Hypoallergenic sheets.   Prevalon boots for both heels ordered 7. Fluids/Electrolytes/Nutrition: Monitor I/Os.    8. Neurogenic bladder:   UA unremarkable, urine culture pending  2/7- will recheck U/A and Cx due to incontinence AND  leukocytosis  2/8- F/U U/A is strongly (+) for UTI- started Keflex 500 mg TID- awaiting Cx- will recheck CBC in AM- wait on stopping Flomax.  2/9- U Cx (+) for Ecoli- pansensitive- WBC down to 14k- will con't Keflex 500 mg TID for a total of 7 days.    2/10- labs in AM- CBC-diff and BMP to make sure things normalizing.  2/11- CBC shows WBC down to 11k- con't Keflex for 7 days total   PVRs  2/12  500cc this am. Flomax, OOB to void 9. HTN: Monitor BP   Norvasc  Mildly elevated on 2/6, monitor for trend  2/11- BP great  120/80 this AM- con't regimen             Monitor with increased mobility 10. Neurogenic bowel:    KUB reviewed, unremarkable             Miralax daily and dulcolax supp w/dig stim after supper.   2/7- says has control, but requiring bowel program?  2/8- Says still needs dig stim, but says has control- didn't have BM last night with program- but will con't because Randall Mccormick thinks he still needs dig stim.   2/9- refused all bowel program x5+ days- explained to Randall Mccormick why he needs it- should be able to avoid "2 days of stools", but the more he waits, the more chance he will have  A 1-2 day "blowout", which will be due to not emptying gut. He said he would try it.  2/10- refused bowel program again. Incontinent- doesn't understand can't control bowels.  2/ 11-12- still refusing bowel program- Randall Mccormick has been educated on rationale behind program 44.  Hypoalbuminemia  Supplement initiated on 2/5 12.  Transaminitis  Resolved 13.  Leukocytosis  WBC 12.1 on 2/5, labs ordered for tomorrow  2/7- WBC 17.7!- will check U/A and Cx again AND CXR since WBC up so much- not on steroids. Afebrile and says he doesn't feel ill.   2/8- has UTI- denied Sx's of UTI, however has incontinence- Keflex 500 mg TID- will wait for Cx results.   2/9- Cx shows ECOLI- PAN-SENSITIVE- CON'T Keflex  2/11- Keflex x total of 7 days-     LOS: 8 days A FACE TO FACE EVALUATION WAS PERFORMED  Meredith Staggers 08/05/2020,  9:33 AM

## 2020-08-05 NOTE — Progress Notes (Signed)
Occupational Therapy Session Note  Patient Details  Name: Randall Mccormick MRN: [id number] Date of Birth: 19-Apr-1956  Today's Date: 08/05/2020 OT Individual Time: 1420-1500 OT Individual Time Calculation (min): 40 min    Short Term Goals: Week 1:  OT Short Term Goal 1 (Week 1): Pt will complete BSC transfer with LRAD and 2 assist OT Short Term Goal 1 - Progress (Week 1): Progressing toward goal OT Short Term Goal 2 (Week 1): Pt will complete UB dressing with Mod A OT Short Term Goal 2 - Progress (Week 1): Met OT Short Term Goal 3 (Week 1): Pt will complete 1 grooming task seated EOB with no more than Mod balance assistance OT Short Term Goal 3 - Progress (Week 1): Met  Skilled Therapeutic Interventions/Progress Updates:    1:1. Pt receive in bed bed. Pt completes Sup>sitting with MOD A overall. Pt transfers with MOD A +2 sit to stand in steady EOB<>w/c. Pt dons shorts with reacher with voerall MOD A to get LEs into pants. Pt changes shirt with S. Pt grooms at sink with se tup. In tx gym pt completes 3x69mn on 1 min rest beach ball volley with 4# dowel rod for cardio conditioning and BUE strengthenig. Exited session with pt seated in bed, exit alarm on and call light in reach   Therapy Documentation Precautions:  Precautions Precautions: Back,Fall Precaution Comments: paraparesis Required Braces or Orthoses: Spinal Brace Spinal Brace: Other (comment) Spinal Brace Comments: Figure 8 Brace Restrictions Weight Bearing Restrictions: No General:   Vital Signs: Therapy Vitals Temp: 98.2 F (36.8 C) Pulse Rate: 94 Resp: 19 BP: 119/69 Patient Position (if appropriate): Lying Oxygen Therapy SpO2: 98 % O2 Device: Room Air Pain:   ADL: ADL Eating: Not assessed Grooming: Setup Where Assessed-Grooming: Bed level Upper Body Bathing: Moderate assistance Where Assessed-Upper Body Bathing: Edge of bed (taking sitting balance into consideration) Lower Body Bathing: Dependent (+2  assist) Where Assessed-Lower Body Bathing: Bed level Upper Body Dressing: Maximal assistance Where Assessed-Upper Body Dressing: Edge of bed Lower Body Dressing: Dependent Where Assessed-Lower Body Dressing: Bed level Toileting: Dependent (+2 assist) Toilet Transfer: Not assessed Tub/Shower Transfer: Not assessed ADL Comments: Toilet and shower transfers not safe to attempt due to pts trunk control deficits and B LE weakness Vision   Perception    Praxis   Exercises:   Other Treatments:     Therapy/Group: Individual Therapy  STonny Branch2/05/2021, 3:02 PM

## 2020-08-06 DIAGNOSIS — N319 Neuromuscular dysfunction of bladder, unspecified: Secondary | ICD-10-CM | POA: Diagnosis not present

## 2020-08-06 DIAGNOSIS — K592 Neurogenic bowel, not elsewhere classified: Secondary | ICD-10-CM | POA: Diagnosis not present

## 2020-08-06 DIAGNOSIS — M5104 Intervertebral disc disorders with myelopathy, thoracic region: Secondary | ICD-10-CM | POA: Diagnosis not present

## 2020-08-06 NOTE — Progress Notes (Signed)
Patient refused evening suppository. Stated his stomach is upset and says "not tonight".

## 2020-08-06 NOTE — Progress Notes (Signed)
Occupational Therapy Session Note  Patient Details  Name: Randall Mccormick MRN: [id number] Date of Birth: 1955-07-10  Today's Date: 08/06/2020 OT Individual Time: 3785-8850 OT Individual Time Calculation (min): 60 min    Short Term Goals: Week 2:  OT Short Term Goal 1 (Week 2): Pt will complete BSC transfer with LRAD and 2 assist OT Short Term Goal 2 (Week 2): Pt will complete LB dressing with mod A using AE PRN OT Short Term Goal 3 (Week 2): Pt will complete toileting tasks with max A OT Short Term Goal 4 (Week 2): Pt will perform UB dressing tasks with min A  Skilled Therapeutic Interventions/Progress Updates:    Patient in bed, alert and ready for therapy session.   He notes having a bad night last night, pain in mid back.  Note edema mid-back (alerted nursing) and provided ice pack to trial for relief.  Provided solid back rest in w/c which provided some relief as well.   Donned pants bed level with max A.  Rolling in bed with rails min A.  Side lying to sitting edge of bed mod A.  Sit to stand from bed to stedy min A with elevated bed surface.  Utilized stedy to w/c.  Completed grooming tasks w/c level mod I/set up.  Completed upper body ergometer w/c level without resistance 2 x 5 minutes.  Core and leg exercises w/c level with moderate difficulty.  Good carryover of back precautions.  Utilized stedy for transfer back to bed, mod A for legs into bed and positioning.  He remained in bed at close of session, bed alarm set and call bell in hand.    Therapy Documentation Precautions:  Precautions Precautions: Back,Fall Precaution Comments: paraparesis Required Braces or Orthoses: Spinal Brace Spinal Brace: Other (comment) Spinal Brace Comments: Figure 8 Brace Restrictions Weight Bearing Restrictions: No   Therapy/Group: Individual Therapy  Carlos Levering 08/06/2020, 7:24 AM

## 2020-08-06 NOTE — Progress Notes (Signed)
[place] PHYSICAL MEDICINE & REHABILITATION PROGRESS NOTE  Subjective/Complaints:  Pt refused bowel program last night again. Had incontinent bm at 0500 this morning. "I don't want someone twirling around in there (rectum)".   ROS: Patient denies fever, rash, sore throat, blurred vision, nausea, vomiting, diarrhea, cough, shortness of breath or chest pain,  headache, or mood change.      Objective: Vital Signs: Blood pressure (!) 141/77, pulse 100, temperature 98.2 F (36.8 C), temperature source Oral, resp. rate 18, height 5\' 8"  (1.727 m), weight 103.6 kg, SpO2 93 %. No results found. Recent Labs    08/04/20 0453  WBC 11.9*  HGB 11.8*  HCT 37.4*  PLT 490*   Recent Labs    08/04/20 0453  NA 135  K 4.1  CL 97*  CO2 29  GLUCOSE 108*  BUN 12  CREATININE 0.78  CALCIUM 8.9    Intake/Output Summary (Last 24 hours) at 08/06/2020 0819 Last data filed at 08/06/2020 0600 Gross per 24 hour  Intake 840 ml  Output 1998 ml  Net -1158 ml        Physical Exam: BP (!) 141/77 (BP Location: Left Arm)   Pulse 100   Temp 98.2 F (36.8 C) (Oral)   Resp 18   Ht 5\' 8"  (1.727 m)   Wt 103.6 kg   SpO2 93%   BMI 34.73 kg/m  Constitutional: No distress . Vital signs reviewed. HEENT: EOMI, oral membranes moist Neck: supple Cardiovascular: RRR without murmur. No JVD    Respiratory/Chest: CTA Bilaterally without wheezes or rales. Normal effort    GI/Abdomen: BS +, non-tender, non-distended Ext: no clubbing, cyanosis, or edema Psych: pleasant and cooperative Skin: Warm and dry.   Back incision  healed-  Musc: No edema in extremities.  No tenderness in extremities. Neuro: Alert Motor: Bilateral upper extremities: 5/5  Right lower extremity: Hip flexion, knee extension 2+/5, ankle dorsiflexion 4-/5, stable Left lower extremity: Hip flexion, knee extension 2/5, ankle dorsiflexion 4 -/5, stable Sensation diminished sl to light touch bilateral lower extremities     Assessment/Plan: 1. Functional deficits which require 3+ hours per day of interdisciplinary therapy in a comprehensive inpatient rehab setting.  Physiatrist is providing close team supervision and 24 hour management of active medical problems listed below.  Physiatrist and rehab team continue to assess barriers to discharge/monitor patient progress toward functional and medical goals   Care Tool:  Bathing    Body parts bathed by patient: Right arm,Left arm,Chest,Abdomen,Front perineal area,Right upper leg,Left upper leg,Face   Body parts bathed by helper: Buttocks,Right lower leg,Left lower leg     Bathing assist Assist Level: Minimal Assistance - Patient > 75%     Upper Body Dressing/Undressing Upper body dressing   What is the patient wearing?: Pull over shirt    Upper body assist Assist Level: Supervision/Verbal cueing    Lower Body Dressing/Undressing Lower body dressing      What is the patient wearing?: Incontinence brief,Pants     Lower body assist Assist for lower body dressing: 2 Helpers     Toileting Toileting    Toileting assist Assist for toileting: Total Assistance - Patient < 25% (urinal, bed level)     Transfers Chair/bed transfer  Transfers assist     Chair/bed transfer assist level: Dependent - mechanical lift     Locomotion Ambulation   Ambulation assist   Ambulation activity did not occur: Safety/medical concerns          Walk 10 feet activity  Assist  Walk 10 feet activity did not occur: Safety/medical concerns        Walk 50 feet activity   Assist Walk 50 feet with 2 turns activity did not occur: Safety/medical concerns         Walk 150 feet activity   Assist Walk 150 feet activity did not occur: Safety/medical concerns         Walk 10 feet on uneven surface  activity   Assist Walk 10 feet on uneven surfaces activity did not occur: Safety/medical concerns         Wheelchair     Assist  Will patient use wheelchair at discharge?: Yes Type of Wheelchair: Manual    Wheelchair assist level: Supervision/Verbal cueing Max wheelchair distance: 150    Wheelchair 50 feet with 2 turns activity    Assist        Assist Level: Supervision/Verbal cueing   Wheelchair 150 feet activity     Assist      Assist Level: Supervision/Verbal cueing    Medical Problem List and Plan: 1.  Bilateral lower extremity weakness with sensory deficits, decreased endurance, poor awareness/lacks insight as well as pain affecting ADLs and mobility secondary to thoracic myelopathy with paraparesis.  Continue CIR PT, OT 2.  Antithrombotics: -DVT/anticoagulation:  Pharmaceutical: Lovenox    Lower extremity Dopplers ordered  2/7- Dopplers (-)- will con't Lovenox             -antiplatelet therapy: N/a 3. Pain Management: Continue oxycodone prn.              --neuropathy BLE and right chest wall-->continue gabapentin 600 mg tid for now.   2/7- will Add Duloxetine 30 mg QHS and decrease Prozac to 20 mg daily since needs help with nerve pain and already at 600 mg TID of gabapentin  2/9- no change in pain, and no side effects or mood getting worse  2/10- no change in weakness/sensation- but pain much worse- will start MS Contin 15 mg BID and maintain other meds per [facility]  2/11-13- pain doing better per pt- will con't MS Contin at this time              4. H/o anxiety/Mood: LCSW to follow for evaluation and support.              Prozaac  2/7- decrease to 20 mg daily so has Duloxetine  2/11- no change in affect             -antipsychotic agents: On Zyprexa 5. Neuropsych: This patient is capable of making decisions on his own behalf. 6. Skin/Wound Care: Routine pressure relief measures. Hypoallergenic sheets.   Prevalon boots for both heels ordered 7. Fluids/Electrolytes/Nutrition: Monitor I/Os.    8. Neurogenic bladder:   UA unremarkable, urine culture pending  2/7- will recheck U/A and  Cx due to incontinence AND leukocytosis  2/8- F/U U/A is strongly (+) for UTI- started Keflex 500 mg TID- awaiting Cx- will recheck CBC in AM- wait on stopping Flomax.  2/9- U Cx (+) for Ecoli- pansensitive- WBC down to 14k- will con't Keflex 500 mg TID for a total of 7 days.    2/10- labs in AM- CBC-diff and BMP to make sure things normalizing.  2/11- CBC shows WBC down to 11k- con't Keflex for 7 days total   PVRs  2/13 improving voids (PVR 150-200-438), continent   -continue Flomax, OOB to void 9. HTN: Monitor BP   Norvasc  Mildly elevated on  2/6, monitor for trend  2/11- BP great 120/80 this AM- con't regimen             Monitor with increased mobility 10. Neurogenic bowel:    KUB reviewed, unremarkable             Miralax daily and dulcolax supp w/dig stim after supper.   2/7- says has control, but requiring bowel program?  2/8- Says still needs dig stim, but says has control- didn't have BM last night with program- but will con't because pt thinks he still needs dig stim.   2/9- refused all bowel program x5+ days- explained to pt why he needs it- should be able to avoid "2 days of stools", but the more he waits, the more chance he will have  A 1-2 day "blowout", which will be due to not emptying gut. He said he would try it.  2/10- refused bowel program again. Incontinent- doesn't understand can't control bowels.  2/13- pt continues to refuse bowel program. Had a frank conversation as to why we're doing it. Pt seemed to listen/understand. We'll see how it goes tonight 35.  Hypoalbuminemia  Supplement initiated on 2/5 12.  Transaminitis  Resolved 13.  Leukocytosis  WBC 12.1 on 2/5, labs ordered for tomorrow  2/7- WBC 17.7!- will check U/A and Cx again AND CXR since WBC up so much- not on steroids. Afebrile and says he doesn't feel ill.   2/8- has UTI- denied Sx's of UTI, however has incontinence- Keflex 500 mg TID- will wait for Cx results.   2/9- Cx shows ECOLI- PAN-SENSITIVE- CON'T  Keflex  2/11- Keflex x total of 7 days-     LOS: 9 days A FACE TO FACE EVALUATION WAS PERFORMED  Meredith Staggers 08/06/2020, 8:19 AM

## 2020-08-07 DIAGNOSIS — M5104 Intervertebral disc disorders with myelopathy, thoracic region: Secondary | ICD-10-CM | POA: Diagnosis not present

## 2020-08-07 LAB — CBC WITH DIFFERENTIAL/PLATELET
Abs Immature Granulocytes: 0.15 10*3/uL — ABNORMAL HIGH (ref 0.00–0.07)
Basophils Absolute: 0.1 10*3/uL (ref 0.0–0.1)
Basophils Relative: 1 %
Eosinophils Absolute: 0.3 10*3/uL (ref 0.0–0.5)
Eosinophils Relative: 4 %
HCT: 37.3 % — ABNORMAL LOW (ref 39.0–52.0)
Hemoglobin: 12.2 g/dL — ABNORMAL LOW (ref 13.0–17.0)
Immature Granulocytes: 2 %
Lymphocytes Relative: 25 %
Lymphs Abs: 2.1 10*3/uL (ref 0.7–4.0)
MCH: 30 pg (ref 26.0–34.0)
MCHC: 32.7 g/dL (ref 30.0–36.0)
MCV: 91.6 fL (ref 80.0–100.0)
Monocytes Absolute: 1 10*3/uL (ref 0.1–1.0)
Monocytes Relative: 12 %
Neutro Abs: 4.7 10*3/uL (ref 1.7–7.7)
Neutrophils Relative %: 56 %
Platelets: 509 10*3/uL — ABNORMAL HIGH (ref 150–400)
RBC: 4.07 MIL/uL — ABNORMAL LOW (ref 4.22–5.81)
RDW: 14.6 % (ref 11.5–15.5)
WBC: 8.2 10*3/uL (ref 4.0–10.5)
nRBC: 0 % (ref 0.0–0.2)

## 2020-08-07 LAB — COMPREHENSIVE METABOLIC PANEL
ALT: 36 U/L (ref 0–44)
AST: 21 U/L (ref 15–41)
Albumin: 3 g/dL — ABNORMAL LOW (ref 3.5–5.0)
Alkaline Phosphatase: 129 U/L — ABNORMAL HIGH (ref 38–126)
Anion gap: 11 (ref 5–15)
BUN: 14 mg/dL (ref 8–23)
CO2: 27 mmol/L (ref 22–32)
Calcium: 9 mg/dL (ref 8.9–10.3)
Chloride: 99 mmol/L (ref 98–111)
Creatinine, Ser: 0.8 mg/dL (ref 0.61–1.24)
GFR, Estimated: >60 mL/min (ref >60)
Glucose, Bld: 114 mg/dL — ABNORMAL HIGH (ref 70–99)
Potassium: 3.9 mmol/L (ref 3.5–5.1)
Sodium: 137 mmol/L (ref 135–145)
Total Bilirubin: 0.8 mg/dL (ref 0.3–1.2)
Total Protein: 6.3 g/dL — ABNORMAL LOW (ref 6.5–8.1)

## 2020-08-07 LAB — GLUCOSE, CAPILLARY: Glucose-Capillary: 121 mg/dL — ABNORMAL HIGH (ref 70–99)

## 2020-08-07 MED ORDER — TAMSULOSIN HCL 0.4 MG PO CAPS
0.8000 mg | ORAL_CAPSULE | Freq: Every day | ORAL | Status: DC
  Administered 2020-08-07 – 2020-08-31 (×25): 0.8 mg via ORAL
  Filled 2020-08-07 (×25): qty 2

## 2020-08-07 NOTE — Progress Notes (Signed)
[place] PHYSICAL MEDICINE & REHABILITATION PROGRESS NOTE  Subjective/Complaints:  Randall Mccormick did bowel program 1x but refused dig stim- just doesn't want "it" because "he's a man".   Refused again last night-  Said required cathing once last night- will increase Flomax [place], back hurting, but hasn't received long acting pain meds yet this AM.     ROS:  Randall Mccormick denies SOB, abd pain, CP, N/V/C/D, and vision changes    Objective: Vital Signs: Blood pressure 128/77, pulse 88, temperature 97.7 F (36.5 C), resp. rate 16, height 5\' 8"  (1.727 m), weight 103.6 kg, SpO2 93 %. No results found. Recent Labs    08/07/20 0547  WBC 8.2  HGB 12.2*  HCT 37.3*  PLT 509*   Recent Labs    08/07/20 0547  NA 137  K 3.9  CL 99  CO2 27  GLUCOSE 114*  BUN 14  CREATININE 0.80  CALCIUM 9.0    Intake/Output Summary (Last 24 hours) at 08/07/2020 0848 Last data filed at 08/07/2020 0519 Gross per 24 hour  Intake 700 ml  Output 1600 ml  Net -900 ml        Physical Exam: BP 128/77 (BP Location: Left Arm)   Pulse 88   Temp 97.7 F (36.5 C)   Resp 16   Ht 5\' 8"  (1.727 m)   Wt 103.6 kg   SpO2 93%   BMI 34.73 kg/m  Constitutional: No distress . Vital signs reviewed. Sitting up watching TV in bed; appropriate, but keeps refusing to discuss bowels, NAD HEENT: EOMI, oral membranes moist Neck: supple Cardiovascular: RRR- no JVD  Respiratory/Chest: CTA B/L- no W/R/R- good air movement GI/Abdomen: soft, NT, slightly distended; hypoactive BS Ext: no clubbing, cyanosis, or edema Psych: pleasant and cooperative Skin: Warm and dry.   Back incision  healed-  Musc: No edema in extremities.  No tenderness in extremities. Neuro: Alert Motor: Bilateral upper extremities: 5/5  Right lower extremity: Hip flexion, knee extension 2+/5, ankle dorsiflexion 4-/5, stable Left lower extremity: Hip flexion, knee extension 2/5, ankle dorsiflexion 4 -/5, stable Sensation diminished sl to light touch bilateral  lower extremities    Assessment/Plan: 1. Functional deficits which require 3+ hours per day of interdisciplinary therapy in a comprehensive inpatient rehab setting.  Physiatrist is providing close team supervision and 24 hour management of active medical problems listed below.  Physiatrist and rehab team continue to assess barriers to discharge/monitor patient progress toward functional and medical goals   Care Tool:  Bathing    Body parts bathed by patient: Right arm,Left arm,Chest,Abdomen,Front perineal area,Right upper leg,Left upper leg,Face   Body parts bathed by helper: Buttocks,Right lower leg,Left lower leg     Bathing assist Assist Level: Minimal Assistance - Patient > 75%     Upper Body Dressing/Undressing Upper body dressing   What is the patient wearing?: Pull over shirt    Upper body assist Assist Level: Supervision/Verbal cueing    Lower Body Dressing/Undressing Lower body dressing      What is the patient wearing?: Incontinence brief,Pants     Lower body assist Assist for lower body dressing: 2 Helpers     Toileting Toileting    Toileting assist Assist for toileting: Total Assistance - Patient < 25% (urinal, bed level)     Transfers Chair/bed transfer  Transfers assist     Chair/bed transfer assist level: Dependent - mechanical lift     Locomotion Ambulation   Ambulation assist   Ambulation activity did not occur: Safety/medical concerns  Walk 10 feet activity   Assist  Walk 10 feet activity did not occur: Safety/medical concerns        Walk 50 feet activity   Assist Walk 50 feet with 2 turns activity did not occur: Safety/medical concerns         Walk 150 feet activity   Assist Walk 150 feet activity did not occur: Safety/medical concerns         Walk 10 feet on uneven surface  activity   Assist Walk 10 feet on uneven surfaces activity did not occur: Safety/medical concerns          Wheelchair     Assist Will patient use wheelchair at discharge?: Yes Type of Wheelchair: Manual    Wheelchair assist level: Supervision/Verbal cueing Max wheelchair distance: 150    Wheelchair 50 feet with 2 turns activity    Assist        Assist Level: Supervision/Verbal cueing   Wheelchair 150 feet activity     Assist      Assist Level: Supervision/Verbal cueing    Medical Problem List and Plan: 1.  Bilateral lower extremity weakness with sensory deficits, decreased endurance, poor awareness/lacks insight as well as pain affecting ADLs and mobility secondary to thoracic myelopathy with paraparesis.  Continue CIR Randall Mccormick, OT 2.  Antithrombotics: -DVT/anticoagulation:  Pharmaceutical: Lovenox    Lower extremity Dopplers ordered  2/7- Dopplers (-)- will con't Lovenox             -antiplatelet therapy: N/a 3. Pain Management: Continue oxycodone prn.              --neuropathy BLE and right chest wall-->continue gabapentin 600 mg tid for now.   2/7- will Add Duloxetine 30 mg QHS and decrease Prozac to 20 mg daily since needs help with nerve pain and already at 600 mg TID of gabapentin  2/9- no change in pain, and no side effects or mood getting worse  2/10- no change in weakness/sensation- but pain much worse- will start MS Contin 15 mg BID and maintain other meds per [facility]  2/11-13- pain doing better per Randall Mccormick- will con't MS Contin at this time             2/14- said having back pain this AM, hasn't received AM meds- will wait to change anything 4. H/o anxiety/Mood: LCSW to follow for evaluation and support.              Prozaac  2/7- decrease to 20 mg daily so has Duloxetine  2/11- no change in affect             -antipsychotic agents: On Zyprexa 5. Neuropsych: This patient is capable of making decisions on his own behalf. 6. Skin/Wound Care: Routine pressure relief measures. Hypoallergenic sheets.   Prevalon boots for both heels ordered 7.  Fluids/Electrolytes/Nutrition: Monitor I/Os.    8. Neurogenic bladder:   UA unremarkable, urine culture pending  2/7- will recheck U/A and Cx due to incontinence AND leukocytosis  2/8- F/U U/A is strongly (+) for UTI- started Keflex 500 mg TID- awaiting Cx- will recheck CBC in AM- wait on stopping Flomax.  2/9- U Cx (+) for Ecoli- pansensitive- WBC down to 14k- will con't Keflex 500 mg TID for a total of 7 days.    2/10- labs in AM- CBC-diff and BMP to make sure things normalizing.  2/11- CBC shows WBC down to 11k- con't Keflex for 7 days total   PVRs  2/13 improving voids (  PVR 150-200-438), continent   -continue Flomax, OOB to void  2/14- increased Flomax to 0.8 mg qevening- since required cath x1-  9. HTN: Monitor BP   Norvasc  Mildly elevated on 2/6, monitor for trend  2/11- BP great 120/80 this AM- con't regimen             Monitor with increased mobility 10. Neurogenic bowel:    KUB reviewed, unremarkable             Miralax daily and dulcolax supp w/dig stim after supper.   2/7- says has control, but requiring bowel program?  2/8- Says still needs dig stim, but says has control- didn't have BM last night with program- but will con't because Randall Mccormick thinks he still needs dig stim.   2/9- refused all bowel program x5+ days- explained to Randall Mccormick why he needs it- should be able to avoid "2 days of stools", but the more he waits, the more chance he will have  A 1-2 day "blowout", which will be due to not emptying gut. He said he would try it.  2/10- refused bowel program again. Incontinent- doesn't understand can't control bowels.  2/13- Randall Mccormick continues to refuse bowel program. Had a frank conversation as to why we're doing it. Randall Mccormick seemed to listen/understand. We'll see how it goes tonight  2/14- refused bowel program again- "he's a man".  11.  Hypoalbuminemia  Supplement initiated on 2/5 12.  Transaminitis  Resolved 13.  Leukocytosis  WBC 12.1 on 2/5, labs ordered for tomorrow  2/7- WBC 17.7!-  will check U/A and Cx again AND CXR since WBC up so much- not on steroids. Afebrile and says he doesn't feel ill.   2/8- has UTI- denied Sx's of UTI, however has incontinence- Keflex 500 mg TID- will wait for Cx results.   2/9- Cx shows ECOLI- PAN-SENSITIVE- CON'T Keflex  2/11- Keflex x total of 7 days-     LOS: 10 days A FACE TO FACE EVALUATION WAS PERFORMED  Marcia Hartwell 08/07/2020, 8:48 AM

## 2020-08-07 NOTE — Progress Notes (Signed)
Occupational Therapy Session Note  Patient Details  Name: Randall Mccormick MRN: [id number] Date of Birth: Nov 14, 1955  Today's Date: 08/07/2020 OT Individual Time: 3785-8850 OT Individual Time Calculation (min): 55 min    Short Term Goals: Week 1:  OT Short Term Goal 1 (Week 1): Pt will complete BSC transfer with LRAD and 2 assist OT Short Term Goal 1 - Progress (Week 1): Progressing toward goal OT Short Term Goal 2 (Week 1): Pt will complete UB dressing with Mod A OT Short Term Goal 2 - Progress (Week 1): Met OT Short Term Goal 3 (Week 1): Pt will complete 1 grooming task seated EOB with no more than Mod balance assistance OT Short Term Goal 3 - Progress (Week 1): Met Week 2:  OT Short Term Goal 1 (Week 2): Pt will complete BSC transfer with LRAD and 2 assist OT Short Term Goal 2 (Week 2): Pt will complete LB dressing with mod A using AE PRN OT Short Term Goal 3 (Week 2): Pt will complete toileting tasks with max A OT Short Term Goal 4 (Week 2): Pt will perform UB dressing tasks with min A   Skilled Therapeutic Interventions/Progress Updates:    Pt greeted at time of session supine in bed resting agreeable to OT session, mild back pain at 4/10 stating this is constant for him. RN entering at end of session for med pass as well. Declined washing LB at bed level today, but did want to put on pants, Min A rolling L/R with cues for log roll but total A for donning pants at bed level. Offered to try in stedy but pt hesitant. Supine > sit Mod/Max, Stedy transfer to wheelchair with Min A to stand from bed surface but mostly Mod A throughout session for sit to stands from wheelchair surface. Set up at sink and performed UB bathe/dress with S/CGA. Transported to gym dependent for time management and performed SCIFIT on level 2 for 5 minutes as a warm up to decrease shoulder tightness/stiffness with brief STM at upper trap. Multiple sit > stands at [facility] with Mod/Max with 2nd helper at times to fully  extend hips and bring buttocks forward, able to stand from 10-20 second intervals with rest breaks. Stedy back to bed, declined sitting up for next therapy session. Sit > supine Max A and hand off to nursing for med pass.   Therapy Documentation Precautions:  Precautions Precautions: Back,Fall Precaution Comments: paraparesis Required Braces or Orthoses: Spinal Brace Spinal Brace: Other (comment) Spinal Brace Comments: Figure 8 Brace Restrictions Weight Bearing Restrictions: No     Therapy/Group: Individual Therapy  Viona Gilmore 08/07/2020, 9:27 AM

## 2020-08-07 NOTE — Progress Notes (Signed)
Physical Therapy Session Note  Patient Details  Name: Randall Mccormick MRN: [id number] Date of Birth: 20-Feb-1956  Today's Date: 08/07/2020 PT Individual Time: 1345-1445 PT Individual Time Calculation (min): 60 min   Short Term Goals: Week 1:  PT Short Term Goal 1 (Week 1): Pt will be max A for transfers with LRAD PT Short Term Goal 2 (Week 1): Pt will be able to maintain sitting balance with mod A for 5 min PT Short Term Goal 3 (Week 1): Pt will be able to propel wc 50 ft with supervision.  Skilled Therapeutic Interventions/Progress Updates:    Pt seated in wheelchair on arrival and agreeable to therapy. Pt reports that he continues to have baseline back pain, 6/10. Pain addressed with positioning and frequent rest breaks. Pt propelled wc with BUE to gym, 100 ft with supervision, requested assistance navigating doorway and positioning at standing frame. Pt performed sit to stand from elevated standing frame sling 3 x 5. Pt reports his back is not painful in standing. Pt passed cones across his body while in standing frame to promote UUE support with sitting breaks due to fatigue. Pt propelled wc back to room, 100 ft and transferred to EOB using stedy, mod A to stand from wc. STS x 6 using stedy with CGA from elevated bed to change brief. Tot A for clothing management and brief, pt able to perform own pericare in standing with stedy. Pt returned to bed with mod A sit>supine. Pt remained semi reclined in bed with all needs in reach and bed alarm active.   Therapy Documentation Precautions:  Precautions Precautions: Back,Fall Precaution Comments: paraparesis Required Braces or Orthoses: Spinal Brace Spinal Brace: Other (comment) Spinal Brace Comments: Figure 8 Brace Restrictions Weight Bearing Restrictions: No   Therapy/Group: Individual Therapy  Sharen Counter, SPT 08/07/2020, 4:52 PM

## 2020-08-07 NOTE — Progress Notes (Signed)
Bowel program started this evening at 1800 hours p[atient refusing dig stim but accepted suppository no bm on this shift night shift otified about bowel program in progress.

## 2020-08-07 NOTE — Progress Notes (Signed)
Occupational Therapy Session Note  Patient Details  Name: Randall Mccormick MRN: [id number] Date of Birth: 03/18/1956  Today's Date: 08/07/2020 OT Individual Time: 1100-1155 OT Individual Time Calculation (min): 55 min    Short Term Goals: Week 2:  OT Short Term Goal 1 (Week 2): Pt will complete BSC transfer with LRAD and 2 assist OT Short Term Goal 2 (Week 2): Pt will complete LB dressing with mod A using AE PRN OT Short Term Goal 3 (Week 2): Pt will complete toileting tasks with max A OT Short Term Goal 4 (Week 2): Pt will perform UB dressing tasks with min A  Skilled Therapeutic Interventions/Progress Updates:    Pt resting in bed upon arrival; brief unfastened and pants below knees. Pt agreeable to getting OOB.  Pt rolled R/L in bed using bed rails to facilitate therapist pulling pants over hips after fastening brief. Pt able to move BLE off EOB but required min A for sidelying>sit EOB with HOB elevated. Sitting balance with supervision. Pt stated he needed to lay back into bed to use urinal. Max A for sit>supine in bed. Dependent for pulling pants down and placement of urinal. Tot A for pulling pants over hips rolling R/L in bed. Min A supine>sit EOB in preparation for transfer to w/c with Stedy. Sit<>stand in [place] with min A. Pt propelled w/c to gym and engaged in BUE activities bouncing 1kg ball against rebounder 4x15. Pt propelled w/c back to room and remained seated in w/c with all needs within reach and belt alarm activated. Focus on bed mobility, sitting balance, w/c mobility, and BUE therex to increase independence with BADLs.   Therapy Documentation Precautions:  Precautions Precautions: Back,Fall Precaution Comments: paraparesis Required Braces or Orthoses: Spinal Brace Spinal Brace: Other (comment) Spinal Brace Comments: Figure 8 Brace Restrictions Weight Bearing Restrictions: No   Pain: Pain Assessment Pain Scale: 0-10 Pain Score: 8  Pain Type: Acute pain Pain Location:  Back Pain Orientation: Mid Pain Descriptors / Indicators: Aching;Stabbing Pain Frequency: Intermittent Pain Onset: On-going Pain Intervention(s): repositioned   Therapy/Group: Individual Therapy  Leroy Libman 08/07/2020, 12:11 PM

## 2020-08-08 DIAGNOSIS — G894 Chronic pain syndrome: Secondary | ICD-10-CM | POA: Diagnosis not present

## 2020-08-08 DIAGNOSIS — M5104 Intervertebral disc disorders with myelopathy, thoracic region: Secondary | ICD-10-CM | POA: Diagnosis not present

## 2020-08-08 NOTE — Progress Notes (Signed)
[place] PHYSICAL MEDICINE & REHABILITATION PROGRESS NOTE  Subjective/Complaints:  Back pain is "killing him"- but isn't taking pain meds q4 hours- only ~6-8 hours per pt.  SO explained has to take prns, before I increase pain meds.   Also, is emphatic won't do dig stim- "men don't do that'.  But did explain I don't know what he's going to do at home when has bowel accidents, sometimes 2+ times per day- who's going to clean him up- his response was- I probably won't have bowel program by d/c"- I explained that's unlikely- can't say won't occur, but unlikely.  He said he'd think about dig stim with bowel program.   Did do suppository last night.    ROS:   Pt denies SOB, abd pain, CP, N/V/C/D, and vision changes     Objective: Vital Signs: Blood pressure (!) 147/83, pulse 82, temperature 97.6 F (36.4 C), resp. rate 17, height 5\' 8"  (1.727 m), weight 103.6 kg, SpO2 96 %. No results found. Recent Labs    08/07/20 0547  WBC 8.2  HGB 12.2*  HCT 37.3*  PLT 509*   Recent Labs    08/07/20 0547  NA 137  K 3.9  CL 99  CO2 27  GLUCOSE 114*  BUN 14  CREATININE 0.80  CALCIUM 9.0    Intake/Output Summary (Last 24 hours) at 08/08/2020 0850 Last data filed at 08/08/2020 0730 Gross per 24 hour  Intake 649 ml  Output 1833 ml  Net -1184 ml        Physical Exam: BP (!) 147/83 (BP Location: Left Arm)   Pulse 82   Temp 97.6 F (36.4 C)   Resp 17   Ht 5\' 8"  (1.727 m)   Wt 103.6 kg   SpO2 96%   BMI 34.73 kg/m  Constitutional: awake, alert, c/o back pain, NAD HEENT: EOMI, oral membranes moist Neck: supple Cardiovascular: RRR- no jVD  Respiratory/Chest: CTA B/L- no W/R/R- good air movement GI/Abdomen: Soft, NT, ND, (+)BS  Ext: no clubbing, cyanosis, or edema Psych: unhappy about bowel program Skin: Warm and dry.   Back incision  healed-  Musc: No edema in extremities.  No tenderness in extremities. Neuro: Alert Motor: Bilateral upper extremities: 5/5  Right  lower extremity: Hip flexion, knee extension 2+/5, ankle dorsiflexion 4-/5, stable Left lower extremity: Hip flexion, knee extension 2/5, ankle dorsiflexion 4 -/5, stable Sensation diminished sl to light touch bilateral lower extremities    Assessment/Plan: 1. Functional deficits which require 3+ hours per day of interdisciplinary therapy in a comprehensive inpatient rehab setting.  Physiatrist is providing close team supervision and 24 hour management of active medical problems listed below.  Physiatrist and rehab team continue to assess barriers to discharge/monitor patient progress toward functional and medical goals   Care Tool:  Bathing    Body parts bathed by patient: Right arm,Left arm,Chest,Abdomen,Front perineal area,Face   Body parts bathed by helper: Buttocks,Right lower leg,Left lower leg     Bathing assist Assist Level: Contact Guard/Touching assist (note pt declined washing LB today, UB only)     Upper Body Dressing/Undressing Upper body dressing   What is the patient wearing?: Pull over shirt    Upper body assist Assist Level: Supervision/Verbal cueing    Lower Body Dressing/Undressing Lower body dressing      What is the patient wearing?: Incontinence brief,Pants     Lower body assist Assist for lower body dressing: Total Assistance - Patient < 25%     Toileting Toileting  Toileting assist Assist for toileting: Total Assistance - Patient < 25% (urinal, bed level)     Transfers Chair/bed transfer  Transfers assist     Chair/bed transfer assist level: Dependent - mechanical lift     Locomotion Ambulation   Ambulation assist   Ambulation activity did not occur: Safety/medical concerns          Walk 10 feet activity   Assist  Walk 10 feet activity did not occur: Safety/medical concerns        Walk 50 feet activity   Assist Walk 50 feet with 2 turns activity did not occur: Safety/medical concerns         Walk 150 feet  activity   Assist Walk 150 feet activity did not occur: Safety/medical concerns         Walk 10 feet on uneven surface  activity   Assist Walk 10 feet on uneven surfaces activity did not occur: Safety/medical concerns         Wheelchair     Assist Will patient use wheelchair at discharge?: Yes Type of Wheelchair: Manual    Wheelchair assist level: Supervision/Verbal cueing Max wheelchair distance: 150    Wheelchair 50 feet with 2 turns activity    Assist        Assist Level: Supervision/Verbal cueing   Wheelchair 150 feet activity     Assist      Assist Level: Supervision/Verbal cueing    Medical Problem List and Plan: 1.  Bilateral lower extremity weakness with sensory deficits, decreased endurance, poor awareness/lacks insight as well as pain affecting ADLs and mobility secondary to thoracic myelopathy with paraparesis.  Continue CIR PT, OT 2.  Antithrombotics: -DVT/anticoagulation:  Pharmaceutical: Lovenox    Lower extremity Dopplers ordered  2/7- Dopplers (-)- will con't Lovenox             -antiplatelet therapy: N/a 3. Pain Management: Continue oxycodone prn.              --neuropathy BLE and right chest wall-->continue gabapentin 600 mg tid for now.   2/7- will Add Duloxetine 30 mg QHS and decrease Prozac to 20 mg daily since needs help with nerve pain and already at 600 mg TID of gabapentin  2/9- no change in pain, and no side effects or mood getting worse  2/10- no change in weakness/sensation- but pain much worse- will start MS Contin 15 mg BID and maintain other meds per [facility]  2/11-13- pain doing better per pt- will con't MS Contin at this time             2/14- said having back pain this AM, hasn't received AM meds- will wait to change anything  2/15- isn't taking pain meds regularly- -asked him to do so.  4. H/o anxiety/Mood: LCSW to follow for evaluation and support.              Prozaac  2/7- decrease to 20 mg daily so has  Duloxetine  2/11- no change in affect             -antipsychotic agents: On Zyprexa 5. Neuropsych: This patient is capable of making decisions on his own behalf. 6. Skin/Wound Care: Routine pressure relief measures. Hypoallergenic sheets.   Prevalon boots for both heels ordered 7. Fluids/Electrolytes/Nutrition: Monitor I/Os.    8. Neurogenic bladder:   UA unremarkable, urine culture pending  2/7- will recheck U/A and Cx due to incontinence AND leukocytosis  2/8- F/U U/A is strongly (+) for  UTI- started Keflex 500 mg TID- awaiting Cx- will recheck CBC in AM- wait on stopping Flomax.  2/9- U Cx (+) for Ecoli- pansensitive- WBC down to 14k- will con't Keflex 500 mg TID for a total of 7 days.    2/10- labs in AM- CBC-diff and BMP to make sure things normalizing.  2/11- CBC shows WBC down to 11k- con't Keflex for 7 days total   PVRs  2/13 improving voids (PVR 150-200-438), continent   -continue Flomax, OOB to void  2/14- increased Flomax to 0.8 mg qevening- since required cath x1-  2/15- WBC down to 8.2- con't regimen  9. HTN: Monitor BP   Norvasc  Mildly elevated on 2/6, monitor for trend  2/11- BP great 120/80 this AM- con't regimen             Monitor with increased mobility 10. Neurogenic bowel:    KUB reviewed, unremarkable             Miralax daily and dulcolax supp w/dig stim after supper.   2/7- says has control, but requiring bowel program?  2/8- Says still needs dig stim, but says has control- didn't have BM last night with program- but will con't because pt thinks he still needs dig stim.   2/9- refused all bowel program x5+ days- explained to pt why he needs it- should be able to avoid "2 days of stools", but the more he waits, the more chance he will have  A 1-2 day "blowout", which will be due to not emptying gut. He said he would try it.  2/10- refused bowel program again. Incontinent- doesn't understand can't control bowels.  2/13- pt continues to refuse bowel program. Had a  frank conversation as to why we're doing it. Pt seemed to listen/understand. We'll see how it goes tonight  2/14- refused bowel program again- "he's a man".   2/15- allowing suppository but not dig stim- has long intense conversation about this-who's going to clean him up at home?- he will "think" about it.   11.  Hypoalbuminemia  Supplement initiated on 2/5 12.  Transaminitis  Resolved 13.  Leukocytosis  WBC 12.1 on 2/5, labs ordered for tomorrow  2/7- WBC 17.7!- will check U/A and Cx again AND CXR since WBC up so much- not on steroids. Afebrile and says he doesn't feel ill.   2/8- has UTI- denied Sx's of UTI, however has incontinence- Keflex 500 mg TID- will wait for Cx results.   2/9- Cx shows ECOLI- PAN-SENSITIVE- CON'T Keflex  2/11- Keflex x total of 7 days-   2/15- WBC 8.2- resolved  I spent a total of 35 minutes on visit- >50% on coordination of care- discussing bowel program and pain.    LOS: 11 days A FACE TO FACE EVALUATION WAS PERFORMED  Randall Mccormick 08/08/2020, 8:50 AM

## 2020-08-08 NOTE — Progress Notes (Signed)
Physical Therapy Session Note  Patient Details  Name: Randall Mccormick MRN: [id number] Date of Birth: Sep 13, 1955  Today's Date: 08/08/2020 PT Individual Time: 1345-1445 PT Individual Time Calculation (min): 60 min   Short Term Goals: Week 2:  PT Short Term Goal 1 (Week 2): Pt will perform least restrictive transfer with max A PT Short Term Goal 2 (Week 2): Pt will tolerate standing x 5 min with LRAD PT Short Term Goal 3 (Week 2): Pt will propel w/c x 150 ft with min A PT Short Term Goal 4 (Week 2): Pt will maintain dynamic sitting balance x 5 min with min A  Skilled Therapeutic Interventions/Progress Updates:    Pt seated in wc on arrival and agreeable to therapy. States he continues to have mid back pain, 6/10 but that he had received pain medication shortly before session. Pain addressed with positioning and frequent rest breaks. Pt attempted wc propulsion with BUE several times throughout session, but continues to have difficulty steering and will not continue after ~100 ft. STS with stedy mod A to min A throughout session. Pt able to stand ~30 sec before fatigued and needing to sit. Pt performed LAQ 3 x 10 while seated on the edge of a mat table with back supported by physioball to improve pain. Pt states that he needs to use the restroom and isn't sure he can hold it to get back to his room. Pt transferred to wc via stedy and returned to room. Pt had an episode of incontinence before he was able to use the urinal. Tot A for standing brief change using stedy. Pt returned to gym to stand in parallel bars with max A x 5. Pt required knee blocking, assist to power up, and VCs to straighten knees, use glutes, and stand tall. Pt states it felt easier the more he did. Pt returned to room and was left with all needs in reach and quick release belt in place.  Therapy Documentation Precautions:  Precautions Precautions: Back,Fall Precaution Comments: paraparesis Required Braces or Orthoses: Spinal  Brace Spinal Brace: Other (comment) Spinal Brace Comments: Figure 8 Brace Restrictions Weight Bearing Restrictions: No    Therapy/Group: Individual Therapy  Sharen Counter, SPT 08/08/2020, 5:17 PM

## 2020-08-08 NOTE — Patient Care Conference (Signed)
Inpatient RehabilitationTeam Conference and Plan of Care Update Date: 08/08/2020   Time: 11:19 AM    Patient Name: Randall Mccormick      Medical Record Number: [id number]  Date of Birth: 1956-02-14 Sex: Male         Room/Bed: 4M04C/4M04C-01 Payor Info: Payor: MEDICARE / Plan: MEDICARE PART A AND B / Product Type: *No Product type* /    Admit Date/Time:  07/28/2020  2:39 PM  Primary Diagnosis:  Thoracic disc disease with myelopathy  Hospital Problems: Principal Problem:   Thoracic disc disease with myelopathy Active Problems:   Constipation   Neurogenic bowel   Neurogenic bladder   Neuropathic pain   Chronic pain syndrome   Leukocytosis   Transaminitis   Hypoalbuminemia due to protein-calorie malnutrition [facility])    Expected Discharge Date: Expected Discharge Date: 08/25/20  Team Members Present: Physician leading conference: Dr. Courtney Heys Care Coodinator Present: Dorthula Nettles, RN, BSN, CRRN;Loralee Pacas, LCSWA Nurse Present: Rayne Du, LPN PT Present: Excell Seltzer, PT OT Present: Willeen Cass, OT;Roanna Epley, COTA PPS Coordinator present : Ileana Ladd, Burna Mortimer, SLP     Current Status/Progress Goal Weekly Team Focus  Bowel/Bladder   Patient is continent and incontinent at times with bladder. Pt. is on bowel program  Pt. will maintain continent with bowel and bladder  Toilet patient every 2 hours and as needed.   Swallow/Nutrition/ Hydration             ADL's   bthing-mod A at shower level; UB dressing-min A/supervision; LB dressing-tot A; transfers with Stedy; pain limiting  min/mod A overall  bed mobility, activity tolerance, BADL training, functional transfers   Mobility   mod A bed mobility, transfers via stedy (progressed from [employer]), Supervision w/c mobility x 100 ft, max A stand in // bars  min A overall, likely w/c level (will need a ramp)  LE NMR and strengthening, transfers, standing as able, w/c mobility   Communication              Safety/Cognition/ Behavioral Observations            Pain   Pt. pain is effectively manage with current pain medications.  Monitor paient for pain on every shift  Notify provider if current pain medication is ineffective   Skin   Pt. has abrasion to left forearm with foam dressing  Maintain skin intergrity with no further skin breakdown  [place] patient's skin on every shift for any breakdown     Discharge Planning:  D/c to home with 24/7 care from pt wife. Pt will need to work on being able to perform self-care needs and becoming more self-sufficient.   Team Discussion: Doesn't do dig stem, will do a suppository. Not taking PRN pain medications as often as he could. MD increased his Flomax. Pain medications were given this morning. Incontinent of urine, spills the urinal. Claims he knows when he has to go. Normal behavior according to the wife. Not able to do self care, may not be able to go back home.  Patient on target to meet rehab goals: Very slow to progress. Bed mobility is mod assist. Have to [place] stedy for transfers. Bathing at shower level is a min assist but max assist for toileting. He is very self limiting, that is the biggest barrier. Complains of pain with each session.   *See Care Plan and progress notes for long and short-term goals.   Revisions to Treatment Plan:  MD increased Flomax.  Teaching  Needs: Family education, medication management, transfer training, gait training, endurance training, toilet transfers, skin/wound care, bowel/bladder training.  Current Barriers to Discharge: Inaccessible home environment, Decreased caregiver support, Home enviroment access/layout, Neurogenic bowel and bladder, Lack of/limited family support, Weight, Weight bearing restrictions, Medication compliance and Behavior  Possible Resolutions to Barriers: Continue current medications, provide emotional support to patient and family.     Medical Summary Current Status: Bowel/bladder  incontinent- "knows when needs to go"- no skin issues; poor initiative -chronic;- thinks knows when goes, but no control  Barriers to Discharge: Incontinence;Neurogenic Bowel & Bladder;Behavior;Decreased family/caregiver support;Home enviroment access/layout;Weight;Medical stability;Medication compliance  Barriers to Discharge Comments: mod A bed; using steady; w/c minA-supervision; little slow progress; Possible Resolutions to [employer] Focus: OT- LB- min-max assist;  using steady for transfers; max A toileting;   poor initiative- self limiting- c/o pain- but no signs of it limiting him;  d/c 3/4- base don 4 weeks   Continued Need for Acute Rehabilitation Level of Care: The patient requires daily medical management by a physician with specialized training in physical medicine and rehabilitation for the following reasons: Direction of a multidisciplinary physical rehabilitation program to maximize functional independence : Yes Medical management of patient stability for increased activity during participation in an intensive rehabilitation regime.: Yes Analysis of laboratory values and/or radiology reports with any subsequent need for medication adjustment and/or medical intervention. : Yes   I attest that I was present, lead the team conference, and concur with the assessment and plan of the team.   Cristi Loron 08/08/2020, 5:03 PM

## 2020-08-08 NOTE — Progress Notes (Signed)
Patient ID: Randall Mccormick, male   DOB: [birth year], 65 y.o.   MRN: [id number]  SW met with pt in room to provide updates from team conference, and d/c date now 3/4. Pt called his wife Randall Mccormick while SW in room. Family meeting confirmed for Thursday 2/17 at 11am. Discussed pt care needs at d/c and planning for w/c level of care. Pt short term goal is to perform his own personal care, with long term goal to walk. Pt encouraged to continue to do his best. Pt is open to dig stem and willing to try despite being apprehensive. SW will continue to follow pt for care needs.   Loralee Pacas, MSW, [place] Office: [phone] Cell: [phone] Fax: [phone]

## 2020-08-08 NOTE — Progress Notes (Signed)
Occupational Therapy Session Note  Patient Details  Name: Randall Mccormick MRN: [id number] Date of Birth: 1955-12-19  Today's Date: 08/08/2020 OT Individual Time: 9798-9211 OT Individual Time Calculation (min): 55 min    Short Term Goals: Week 2:  OT Short Term Goal 1 (Week 2): Pt will complete BSC transfer with LRAD and 2 assist OT Short Term Goal 2 (Week 2): Pt will complete LB dressing with mod A using AE PRN OT Short Term Goal 3 (Week 2): Pt will complete toileting tasks with max A OT Short Term Goal 4 (Week 2): Pt will perform UB dressing tasks with min A  Skilled Therapeutic Interventions/Progress Updates:    OT intervention with focus on bathing at shower level and dressing with sit<>stand from w/c with Stedy. Tranfser to shower with Stedy. Pt completed shower with min A using AD. Transfer to [facility] over toilet with Stedy. Dependent for toileting tasks. Pt tot A for LB dressing tasks. UB dressing and grooming with supervision. Sit<>stand in [place] with CGA. Pt remained in w/c with all needs within reach and belt alarm activated.   Therapy Documentation Precautions:  Precautions Precautions: Back,Fall Precaution Comments: paraparesis Required Braces or Orthoses: Spinal Brace Spinal Brace: Other (comment) Spinal Brace Comments: Figure 8 Brace Restrictions Weight Bearing Restrictions: No  Pain: Pt c/o 6/10 back pain; shower and repositioned  Therapy/Group: Individual Therapy  Leroy Libman 08/08/2020, 10:01 AM

## 2020-08-08 NOTE — Progress Notes (Signed)
Physical Therapy Weekly Progress Note  Patient Details  Name: Randall Mccormick MRN: [id number] Date of Birth: Feb 16, 1956  Beginning of progress report period: July 29, 2020 End of progress report period: August 08, 2020  Today's Date: 08/08/2020  Patient has met 2 of 3 short term goals.  Pt is making slow progress towards therapy goals. Pt is overall mod A for bed mobility, dependent for transfers via use of stedy (has recently progressed from use of [employer]), and Supervision to min A for w/c mobility up to 100 ft before onset of fatigue. Pt has been able to stand to stedy with up to mod A from lower height surfaces and stand in the // bars for a few seconds with max A needed to stand. Pt has been unable to initiate gait training due to LE weakness and decreased overall endurance. Pt remains limited by pain in his back as well as overall decreased endurance and muscular strength as well as body habitus. The patient's LTG are currently set for wheelchair level but may be updated pending progress.  Patient continues to demonstrate the following deficits muscle weakness, muscle joint tightness and muscle paralysis, decreased cardiorespiratoy endurance, abnormal tone and unbalanced muscle activation and decreased sitting balance, decreased standing balance, decreased postural control and decreased balance strategies and therefore will continue to benefit from skilled PT intervention to increase functional independence with mobility.  Patient progressing toward long term goals..  Continue plan of care.  PT Short Term Goals Week 1:  PT Short Term Goal 1 (Week 1): Pt will be max A for transfers with LRAD PT Short Term Goal 1 - Progress (Week 1): Progressing toward goal PT Short Term Goal 2 (Week 1): Pt will be able to maintain sitting balance with mod A for 5 min PT Short Term Goal 2 - Progress (Week 1): Met PT Short Term Goal 3 (Week 1): Pt will be able to propel wc 50 ft with supervision. PT  Short Term Goal 3 - Progress (Week 1): Met Week 2:  PT Short Term Goal 1 (Week 2): Pt will perform least restrictive transfer with max A PT Short Term Goal 2 (Week 2): Pt will tolerate standing x 5 min with LRAD PT Short Term Goal 3 (Week 2): Pt will propel w/c x 150 ft with min A PT Short Term Goal 4 (Week 2): Pt will maintain dynamic sitting balance x 5 min with min A   Therapy Documentation Precautions:  Precautions Precautions: Back,Fall Precaution Comments: paraparesis Required Braces or Orthoses: Spinal Brace Spinal Brace: Other (comment) Spinal Brace Comments: Figure 8 Brace Restrictions Weight Bearing Restrictions: No   Therapy/Group: Individual Therapy   Excell Seltzer, PT, DPT  08/08/2020, 7:46 AM

## 2020-08-08 NOTE — Consult Note (Signed)
Neuropsychological Consultation   Patient:   Randall Mccormick   DOB:   12-22-1955  MR Number:  [id number]  Location:  [place] [address] CENTER B [place] [id number] [place] [address] Dept: [place]: [phone]           Date of Service:   08/08/2020  Start Time:   3 PM End Time:   4 PM  Provider/Observer:  Ilean Skill, Psy.D.       Clinical Neuropsychologist       Billing Code/Service: [phone]  Chief Complaint:    Randall Mccormick is a 65 year old male with past medical history including hypertension, vitamin D deficiency, gout, arthritis, anxiety disorder, degenerative disc disease with multiple back surgeries, lower back pain with weakness status post spinal cord stimulator and multiple prior lumbar surgeries.  Last surgery was T10-pelvis decompressive fusion on 11/23/2019 performed at [facility] with CIR stay.  Patient has had multiple falls since discharge to home with with progressive weakness and bilateral lower extremity numbness from hips down.  Reports decline started around October and he again started using rolling walker.  Patient was admitted to [facility] on 07/19/2020 after fall in shower with reported incontinence.  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  Patient underwent posterior thoracic fusion with laminectomy and extension of thoracentesis to T8.  Hospital course complicated by postop bilateral lower extremity paraplegia as well as hypotension.  Patient has started having improvement in bilateral lower extremity movement on 07/13/2020 and therapy has been ongoing.  Bowel program was required.  Reason for Service:  Patient was referred for neuropsychological consultation due to coping adjustment issues and history of anxiety associated with worsening chronic pain symptoms and  significant functional loss.  Below is the HPI for the current admission.  HPI:  Randall Mccormick is a 65 year old male with history of HTN, Vitamin D deficiency, gouty arthritis, anxiety d/o, DDD with multiple back surgeries, LBP with weakness s/p spinal cord stimulator and multiple lumbar surgery--last T10- pelvis decompressive fusion on 11/23/19 with [facility] CIR stay, multiple falls since d/c to home with progressive weakness and BLE numbness from hips down.  He started declining around October and had to start using his walker again. History from chart review and patient.  He was admitted to [facility] on 07/19/2020 after a fall in the shower, with reported incontinence.  Work-up revealed severe flattening of the thecal sac with severe segmental spinal stenosis from T8-T10 and epidural thickening with fibrosis/disc bulge. He underwent posterior thoracic fusion with laminectomy and extension of arthrodesis to T8.    Hospital course complicated by postop bilateral lower extremity paraplegia as well as hypotension treated with Riluzole, fluid bolus, plasma as well as pressors to Keep MAPs > 85. He started having improvement in BLE movement on 07/13/2020 and therapy has been ongoing. Bowel program augmented to manage constipation and wound VAC discontinued 02/03. He has had difficulty voiding requiring I/O caths after which condom cath was placed to help manage "incontinence".  On Riluzole for SCI-->to complete 13-day course on 02/09. Patient with resultant bilateral lower extremity weakness with sensory deficits, decreased endurance, has poor awareness/lacks insight into deficits as well as pain affecting ADLs and mobility. CIR recommended due to functional decline.  Please see preadmission assessment from earlier today as well.  Current Status:  Patient was alert and oriented with good cognition  and sitting in a wheelchair.  Patient reports that he is beginning to move his lower extremities but continues to be very weak.   He has been actively working in the therapeutic process and experiencing gains and worked on the parallel bars and rolling walker at times today in therapy.  Patient reports that he has experienced an increase in anxiety but it does not stop him from fully participating in therapeutic interventions.  Patient reports that he continues to have mid back pain that is new since surgery but also this mid back pain is not stopping him from being able to fully participate.  Patient had numerous questions about what to expect with therapy and these questions were answered as best as possible.  Behavioral Observation: Randall Mccormick  presents as a 65 y.o.-year-old Right handed Caucasian Male who appeared his stated age. his dress was Appropriate and he was Well Groomed and his manners were Appropriate to the situation.  his participation was indicative of Appropriate and Attentive behaviors.  There were physical disabilities noted.  he displayed an appropriate level of cooperation and motivation.     Interactions:    Active Appropriate and Attentive  Attention:   within normal limits and attention span and concentration were age appropriate  Memory:   within normal limits; recent and remote memory intact  Visuo-spatial:  not examined  Speech (Volume):  low  Speech:   normal; normal  Thought Process:  Coherent and Relevant  Though Content:  WNL; not suicidal and not homicidal  Orientation:   person, place, time/date and situation  Judgment:   Good  Planning:   Fair  Affect:    Appropriate  Mood:    Anxious  Insight:   Good  Intelligence:   normal  Medical History:   Past Medical History:  Diagnosis Date  . Anxiety   . Cobalamin deficiency   . Degeneration of lumbar or lumbosacral intervertebral disc   . Depression   . Gouty arthropathy, unspecified   . Hemorrhoids   . Hyperlipidemia   . Hypertension   . Multiple fractures of ribs of left side 04/20/13   FALL FROM A LADDER ON   .  Vitamin D deficiency          Patient Active Problem List   Diagnosis Date Noted  . Leukocytosis   . Transaminitis   . Hypoalbuminemia due to protein-calorie malnutrition ([place])   . 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/2020  . Constipation   . Neurogenic bowel   . Neurogenic bladder   . Neuropathic pain   . Chronic pain syndrome   . Anxiety   . Cobalamin deficiency   . Degeneration of lumbar or lumbosacral intervertebral disc   . Hemorrhoids   . Hyperlipidemia   . Hypertension   . Depression   . Gouty arthropathy, unspecified   . Vitamin D deficiency   . Multiple fractures of ribs of left side 04/20/2013     Psychiatric History:  Patient with prior psychiatric history including anxiety and depression.  With home medications including Prozac and Zyprexa.  Unclear as to the purpose of the Zyprexa prescription although may be due to anxiety versus prior hypomanic or manic episodes.  Patient has had times of worsening of anxiety symptoms with chronic pain and has had past significant psychosocial stressors including the death of her son at age 61 from leukemia.  Family Med/Psych History:  Family History  Problem Relation Age of Onset  . Cancer Mother  BREAST  . Heart disease Father   . Alcoholism Father   . Cancer Son        LEUKEMIA    Impression/DX:  Randall Mccormick is a 65 year old male with past medical history including hypertension, vitamin D deficiency, gout, arthritis, anxiety disorder, degenerative disc disease with multiple back surgeries, lower back pain with weakness status post spinal cord stimulator and multiple prior lumbar surgeries.  Last surgery was T10-pelvis decompressive fusion on 11/23/2019 performed at [facility] with CIR stay.  Patient has had multiple falls since discharge to home with with progressive weakness and bilateral lower extremity numbness from hips down.  Reports decline started around October and he again started  using rolling walker.  Patient was admitted to [facility] on 07/19/2020 after fall in shower with reported incontinence.  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  Patient underwent posterior thoracic fusion with laminectomy and extension of thoracentesis to T8.  Hospital course complicated by postop bilateral lower extremity paraplegia as well as hypotension.  Patient has started having improvement in bilateral lower extremity movement on 07/13/2020 and therapy has been ongoing.  Bowel program was required.  Patient was alert and oriented with good cognition and sitting in a wheelchair.  Patient reports that he is beginning to move his lower extremities but continues to be very weak.  He has been actively working in the therapeutic process and experiencing gains and worked on the parallel bars and rolling walker at times today in therapy.  Patient reports that he has experienced an increase in anxiety but it does not stop him from fully participating in therapeutic interventions.  Patient reports that he continues to have mid back pain that is new since surgery but also this mid back pain is not stopping him from being able to fully participate.  Patient had numerous questions about what to expect with therapy and these questions were answered as best as possible.  Patient with prior psychiatric history including anxiety and depression.  With home medications including Prozac and Zyprexa.  Unclear as to the purpose of the Zyprexa prescription although may be due to anxiety versus prior hypomanic or manic episodes.  Patient has had times of worsening of anxiety symptoms with chronic pain and has had past significant psychosocial stressors including the death of her son around age 70 from leukemia.  Disposition/Plan:  Today we worked on coping and adjustment issues.  Will need to keep an eye to make sure that  anxiety does not become exacerbated or possible hypomanic episodes do not develop.  Is unclear the complete purpose of the Zyprexa historically.          Electronically Signed   _______________________ Ilean Skill, Psy.D. Clinical Neuropsychologist

## 2020-08-08 NOTE — Progress Notes (Signed)
Occupational Therapy Session Note  Patient Details  Name: Randall Mccormick MRN: [id number] Date of Birth: May 11, 1956  Today's Date: 08/08/2020 OT Individual Time: 1100-1200 OT Individual Time Calculation (min): 60 min    Short Term Goals: Week 2:  OT Short Term Goal 1 (Week 2): Pt will complete BSC transfer with LRAD and 2 assist OT Short Term Goal 2 (Week 2): Pt will complete LB dressing with mod A using AE PRN OT Short Term Goal 3 (Week 2): Pt will complete toileting tasks with max A OT Short Term Goal 4 (Week 2): Pt will perform UB dressing tasks with min A  Skilled Therapeutic Interventions/Progress Updates:    Patient seated in w/c, ready for therapy session.  He notes ongoing pain in back.  Sit to stand in stedy to manage pants and use urinal with min A.   Transfer to mat table with stedy CS to stand from w/c and mat surfaces.  Tolerated unsupported sitting with focus on back mobility/light stretching, scapular mobility and shoulder ROM.  Returned to w/c via stedy.  Provided ice pack for pain control with good results, completed ergometer 2 x 5 minutes, theraband exercises.  He remained seated in w/c at close of session, seat belt alarm set and call bell in hand.    Therapy Documentation Precautions:  Precautions Precautions: Back,Fall Precaution Comments: paraparesis Required Braces or Orthoses: Spinal Brace Spinal Brace: Other (comment) Spinal Brace Comments: Figure 8 Brace Restrictions Weight Bearing Restrictions: No   Therapy/Group: Individual Therapy  Carlos Levering 08/08/2020, 7:43 AM

## 2020-08-09 DIAGNOSIS — M5104 Intervertebral disc disorders with myelopathy, thoracic region: Secondary | ICD-10-CM | POA: Diagnosis not present

## 2020-08-09 MED ORDER — OXYCODONE HCL 5 MG PO TABS
15.0000 mg | ORAL_TABLET | ORAL | Status: DC | PRN
  Administered 2020-08-09 – 2020-08-24 (×31): 15 mg via ORAL
  Filled 2020-08-09 (×34): qty 3

## 2020-08-09 NOTE — Progress Notes (Signed)
Physical Therapy Session Note  Patient Details  Name: Randall Mccormick MRN: [id number] Date of Birth: 1956-02-05  Today's Date: 08/09/2020 PT Individual Time: 0800-0900, 1450-1550 PT Individual Time Calculation (min): 60 min 60 min Time Missed: 15 min Missed time reason: pt fatigue  Short Term Goals: Week 2:  PT Short Term Goal 1 (Week 2): Pt will perform least restrictive transfer with max A PT Short Term Goal 2 (Week 2): Pt will tolerate standing x 5 min with LRAD PT Short Term Goal 3 (Week 2): Pt will propel w/c x 150 ft with min A PT Short Term Goal 4 (Week 2): Pt will maintain dynamic sitting balance x 5 min with min A  Skilled Therapeutic Interventions/Progress Updates:    AM session: pt semi reclined in bed on arrival and reporting 6/10 pain at rest. States he took pain medication prior to session, pain managed with rest breaks and positioning. Pt able to sit to EOB with CGA, tot A for LB dressing for time management. Min A STS with stedy for transfers throughout session. Pt transported to gym for energy conservation. Pt performed wc pushups from arm rests x 10. Performed slide board transfer wc<>mat table with min A to mat and max A to wc. Pt reported increased pain when leaning to L side to maneuver slide board, reporting 10/10 pain after transfer. Pt educated on head hips relationship and was able to unload bottom, but could not scoot laterally without assistance due to pain. Pt performed mini squats in parallel bars 2x5, 1x3 to fatigue with max A to block knees, heavy verbal cues to stand straight and eccentric knee control. Pt propelled wc 150 ft to room. Pt stated he needed to use urinal, was able to void with assist for holding urinal in place, independent for hygiene. Pt returned to bed min A with stedy and mod A for LE management EOB>supine. Pt left with all needs in reach and bed alarm active.     PM session: pt seated in wc on arrival and agreeable to therapy. Pt states he would  like to try standing and taking steps. Pt transported to gym and stood to parallel bars with min A throughout session. Pt was able to take 5, 10, 8, 5, 8 steps with mod A and max VCs for posture, weight shift, and BOS. Pt performed better with spacer between legs due to narrow BOS and poor sensation in his feet. Pt demonstrated some hyperextension and had difficulty shifting weight forward over foot to advance CL limb. Pt played volleyball while seated in wc with 5 lb weight bar x 15 minutes for endurance with rest breaks. Pt able to propel chair with BUE approx. 50 ft x 2. Pt returned to bed min A with stedy, mod A EOB>supine for LE management. Pt left in bed with all needs in reach and bed alarm active. Pt missed 15 min at end of session due to fatigue.   Therapy Documentation Precautions:  Precautions Precautions: Back,Fall Precaution Comments: paraparesis Required Braces or Orthoses: Spinal Brace Spinal Brace: Other (comment) Spinal Brace Comments: Figure 8 Brace Restrictions Weight Bearing Restrictions: No General:    Therapy/Group: Individual Therapy  Sharen Counter, SPT 08/09/2020, 12:02 PM

## 2020-08-09 NOTE — Progress Notes (Signed)
[facility] PHYSICAL MEDICINE & REHABILITATION PROGRESS NOTE  Subjective/Complaints:  Pt reports did bowel program with dig stim last night- no results.  Back still killing him- even with little ROM.    ROS:   Pt denies SOB, abd pain, CP, N/V/C/D, and vision changes    Objective: Vital Signs: Blood pressure 122/77, pulse 78, temperature 97.8 F (36.6 C), resp. rate 16, height 5\' 8"  (1.727 m), weight 103.6 kg, SpO2 93 %. No results found. Recent Labs    08/07/20 0547  WBC 8.2  HGB 12.2*  HCT 37.3*  PLT 509*   Recent Labs    08/07/20 0547  NA 137  K 3.9  CL 99  CO2 27  GLUCOSE 114*  BUN 14  CREATININE 0.80  CALCIUM 9.0    Intake/Output Summary (Last 24 hours) at 08/09/2020 0755 Last data filed at 08/09/2020 5956 Gross per 24 hour  Intake 810 ml  Output 1775 ml  Net -965 ml        Physical Exam: BP 122/77 (BP Location: Left Arm)   Pulse 78   Temp 97.8 F (36.6 C)   Resp 16   Ht 5\' 8"  (1.727 m)   Wt 103.6 kg   SpO2 93%   BMI 34.73 kg/m  Constitutional: awake, alert, more appropriate, NAD HEENT: EOMI, oral membranes moist Neck: supple Cardiovascular: RRR- no JVD  Respiratory/Chest: CTA B/L- no W/R/R- good air movement GI/Abdomen: Soft, NT, ND, (+)BS  Ext: no clubbing, cyanosis, or edema Psych: calmer, more appropriate Skin: Warm and dry.   Back incision  healed-  Musc: No edema in extremities.  No tenderness in extremities. Neuro: Alert Motor: Bilateral upper extremities: 5/5  Right lower extremity: Hip flexion, knee extension 2+/5, ankle dorsiflexion 4-/5, stable Left lower extremity: Hip flexion, knee extension 2/5, ankle dorsiflexion 4 -/5, stable Sensation diminished sl to light touch bilateral lower extremities    Assessment/Plan: 1. Functional deficits which require 3+ hours per day of interdisciplinary therapy in a comprehensive inpatient rehab setting.  Physiatrist is providing close team supervision and 24 hour management of active  medical problems listed below.  Physiatrist and rehab team continue to assess barriers to discharge/monitor patient progress toward functional and medical goals   Care Tool:  Bathing    Body parts bathed by patient: Right arm,Left arm,Chest,Abdomen,Front perineal area,Right upper leg,Left upper leg,Right lower leg,Left lower leg,Face   Body parts bathed by helper: Buttocks     Bathing assist Assist Level: Minimal Assistance - Patient > 75%     Upper Body Dressing/Undressing Upper body dressing   What is the patient wearing?: Pull over shirt    Upper body assist Assist Level: Set up assist    Lower Body Dressing/Undressing Lower body dressing      What is the patient wearing?: Incontinence brief,Pants     Lower body assist Assist for lower body dressing: Total Assistance - Patient < 25%     Toileting Toileting    Toileting assist Assist for toileting: Total Assistance - Patient < 25% (urinal, bed level)     Transfers Chair/bed transfer  Transfers assist     Chair/bed transfer assist level: Dependent - mechanical lift     Locomotion Ambulation   Ambulation assist   Ambulation activity did not occur: Safety/medical concerns          Walk 10 feet activity   Assist  Walk 10 feet activity did not occur: Safety/medical concerns        Walk 50 feet  activity   Assist Walk 50 feet with 2 turns activity did not occur: Safety/medical concerns         Walk 150 feet activity   Assist Walk 150 feet activity did not occur: Safety/medical concerns         Walk 10 feet on uneven surface  activity   Assist Walk 10 feet on uneven surfaces activity did not occur: Safety/medical concerns         Wheelchair     Assist Will patient use wheelchair at discharge?: Yes Type of Wheelchair: Manual    Wheelchair assist level: Supervision/Verbal cueing Max wheelchair distance: 100    Wheelchair 50 feet with 2 turns  activity    Assist        Assist Level: Supervision/Verbal cueing   Wheelchair 150 feet activity     Assist      Assist Level: Moderate Assistance - Patient 50 - 74%    Medical Problem List and Plan: 1.  Bilateral lower extremity weakness with sensory deficits, decreased endurance, poor awareness/lacks insight as well as pain affecting ADLs and mobility secondary to thoracic myelopathy with paraparesis.  Continue CIR PT, OT 2.  Antithrombotics: -DVT/anticoagulation:  Pharmaceutical: Lovenox    Lower extremity Dopplers ordered  2/7- Dopplers (-)- will con't Lovenox             -antiplatelet therapy: N/a 3. Pain Management: Continue oxycodone prn.              --neuropathy BLE and right chest wall-->continue gabapentin 600 mg tid for now.   2/7- will Add Duloxetine 30 mg QHS and decrease Prozac to 20 mg daily since needs help with nerve pain and already at 600 mg TID of gabapentin  2/9- no change in pain, and no side effects or mood getting worse  2/10- no change in weakness/sensation- but pain much worse- will start MS Contin 15 mg BID and maintain other meds per [facility]  2/11-13- pain doing better per pt- will con't MS Contin at this time             2/14- said having back pain this AM, hasn't received AM meds- will wait to change anything  2/15- isn't taking pain meds regularly- -asked him to do so.  2/16- will increase Oxycodone to 15 mg q4 hours prn  4. H/o anxiety/Mood: LCSW to follow for evaluation and support.              Prozaac  2/7- decrease to 20 mg daily so has Duloxetine  2/11- no change in affect             -antipsychotic agents: On Zyprexa 5. Neuropsych: This patient is capable of making decisions on his own behalf. 6. Skin/Wound Care: Routine pressure relief measures. Hypoallergenic sheets.   Prevalon boots for both heels ordered 7. Fluids/Electrolytes/Nutrition: Monitor I/Os.    8. Neurogenic bladder:   UA unremarkable, urine culture pending  2/7-  will recheck U/A and Cx due to incontinence AND leukocytosis  2/8- F/U U/A is strongly (+) for UTI- started Keflex 500 mg TID- awaiting Cx- will recheck CBC in AM- wait on stopping Flomax.  2/9- U Cx (+) for Ecoli- pansensitive- WBC down to 14k- will con't Keflex 500 mg TID for a total of 7 days.    2/10- labs in AM- CBC-diff and BMP to make sure things normalizing.  2/11- CBC shows WBC down to 11k- con't Keflex for 7 days total   PVRs  2/13  improving voids (PVR 150-200-438), continent   -continue Flomax, OOB to void  2/14- increased Flomax to 0.8 mg qevening- since required cath x1-  2/15- WBC down to 8.2- con't regimen   2/16- hasn't required cath in last 48 hours 9. HTN: Monitor BP   Norvasc  Mildly elevated on 2/6, monitor for trend  2/16- BP 122/77- con't regimen             Monitor with increased mobility 10. Neurogenic bowel:    KUB reviewed, unremarkable             Miralax daily and dulcolax supp w/dig stim after supper.   2/7- says has control, but requiring bowel program?  2/8- Says still needs dig stim, but says has control- didn't have BM last night with program- but will con't because pt thinks he still needs dig stim.   2/9- refused all bowel program x5+ days- explained to pt why he needs it- should be able to avoid "2 days of stools", but the more he waits, the more chance he will have  A 1-2 day "blowout", which will be due to not emptying gut. He said he would try it.  2/10- refused bowel program again. Incontinent- doesn't understand can't control bowels.  2/13- pt continues to refuse bowel program. Had a frank conversation as to why we're doing it. Pt seemed to listen/understand. We'll see how it goes tonight  2/14- refused bowel program again- "he's a man".   2/15- allowing suppository but not dig stim- has long intense conversation about this-who's going to clean him up at home?- he will "think" about it.    2/16- plans on doing bowel program with dig stim now 11.   Hypoalbuminemia  Supplement initiated on 2/5 12.  Transaminitis  Resolved 13.  Leukocytosis  WBC 12.1 on 2/5, labs ordered for tomorrow  2/7- WBC 17.7!- will check U/A and Cx again AND CXR since WBC up so much- not on steroids. Afebrile and says he doesn't feel ill.   2/8- has UTI- denied Sx's of UTI, however has incontinence- Keflex 500 mg TID- will wait for Cx results.   2/9- Cx shows ECOLI- PAN-SENSITIVE- CON'T Keflex  2/11- Keflex x total of 7 days-   2/15- WBC 8.2- resolved    LOS: 12 days A FACE TO FACE EVALUATION WAS PERFORMED  Shakima Nisley 08/09/2020, 7:55 AM

## 2020-08-09 NOTE — Progress Notes (Signed)
Occupational Therapy Session Note  Patient Details  Name: Randall Mccormick MRN: [id number] Date of Birth: [birth year]  Today's Date: 08/09/2020 OT Individual Time: 1100-1200 OT Individual Time Calculation (min): 60 min    Short Term Goals: Week 2:  OT Short Term Goal 1 (Week 2): Pt will complete BSC transfer with LRAD and 2 assist OT Short Term Goal 2 (Week 2): Pt will complete LB dressing with mod A using AE PRN OT Short Term Goal 3 (Week 2): Pt will complete toileting tasks with max A OT Short Term Goal 4 (Week 2): Pt will perform UB dressing tasks with min A  Skilled Therapeutic Interventions/Progress Updates:    Pt resting in bed upon arrival. Supine>sit EOB with supervision using bed functions. Pt threaded pants using reacher and pulled up to knees. Sit<>stand from EOB with Stedy. Pt unable to assist with pulling pants over hips. Attempted lateral scoot on EOB but pt c/o increased back pain. Attempted to don shoes but pt unable to lift legs adequately to place into shoe. Transfer to w/c with Stedy. Min A for sit<>stand in [place]. Pt propelled w/c to nursing station. Pt transitioned to day room and engaged in w/c BUE activities to increase endurance and BUE strength. Tapping ball with 4# bar 6x15. Pt maneuvered w/c through series on cones on floor. Pt with difficulty avoiding cones on floor 2/2 unable to see cones when positioned closely. Pt returned to room and returned to bed with Stedy to change incontinence brief. Pt dependent for doffing/donning brief and pulling pants over hips. Pt returned to w/c with Stedy. Pt remained in w/c with all needs within reach and belt alarm activated.   Therapy Documentation Precautions:  Precautions Precautions: Back,Fall Precaution Comments: paraparesis Required Braces or Orthoses: Spinal Brace Spinal Brace: Other (comment) Spinal Brace Comments: Figure 8 Brace Restrictions Weight Bearing Restrictions: No   Pain:  Pt c/o 6/10 back pain. Pt stated he  couldn't have any more meds until after therapy session; repositioned and emotional support   Therapy/Group: Individual Therapy  Leroy Libman 08/09/2020, 12:26 PM

## 2020-08-10 DIAGNOSIS — M5104 Intervertebral disc disorders with myelopathy, thoracic region: Secondary | ICD-10-CM | POA: Diagnosis not present

## 2020-08-10 NOTE — Progress Notes (Signed)
Occupational Therapy Session Note  Patient Details  Name: Demico Ploch MRN: [id number] Date of Birth: 1955-08-23  Today's Date: 08/10/2020 OT Individual Time: 1430-1530 OT Individual Time Calculation (min): 60 min    Short Term Goals: Week 2:  OT Short Term Goal 1 (Week 2): Pt will complete BSC transfer with LRAD and 2 assist OT Short Term Goal 2 (Week 2): Pt will complete LB dressing with mod A using AE PRN OT Short Term Goal 3 (Week 2): Pt will complete toileting tasks with max A OT Short Term Goal 4 (Week 2): Pt will perform UB dressing tasks with min A  Skilled Therapeutic Interventions/Progress Updates:    Patient seated in w/c, ready for therapy session.  He notes that pain is improving in back and his endurance is better.  To gym via w/c - returned to room due to incontinent episode - utilized stedy for clean up and change of clothes.  Sit to stand in stedy from w/c surface CS.  He is able to maintain stance - dependent for clothing management and hygiene, max A to donn clean shorts, dependent for brief.   To mat table in gym via stedy.  He tolerated unsupported sitting for posture, shoulder and core activities without difficulty for 30 minutes.  Completed standing quad exercises in stedy with CGA.  Returned to bed via stedy - sit to supine max A for bilateral lower legs.  He remained in bed at close of session, bed alarm set and call bell in hand.    Therapy Documentation Precautions:  Precautions Precautions: Back,Fall Precaution Comments: paraparesis Required Braces or Orthoses: Spinal Brace Spinal Brace: Other (comment) Spinal Brace Comments: Figure 8 Brace Restrictions Weight Bearing Restrictions: No   Therapy/Group: Individual Therapy  Carlos Levering 08/10/2020, 7:47 AM

## 2020-08-10 NOTE — Progress Notes (Signed)
Physical Therapy Session Note  Patient Details  Name: Randall Mccormick MRN: [id number] Date of Birth: 15-Feb-1956  Today's Date: 08/10/2020 PT Individual Time: 1008-1102 PT Individual Time Calculation (min): 54 min   Short Term Goals: Week 2:  PT Short Term Goal 1 (Week 2): Pt will perform least restrictive transfer with max A PT Short Term Goal 2 (Week 2): Pt will tolerate standing x 5 min with LRAD PT Short Term Goal 3 (Week 2): Pt will propel w/c x 150 ft with min A PT Short Term Goal 4 (Week 2): Pt will maintain dynamic sitting balance x 5 min with min A  Skilled Therapeutic Interventions/Progress Updates: Pt presented in bed agreeable to therapy. Pt states pain 6/10 in back but premedicated. Pt performed rolling L/R with supervision and use of bed rail as PTA threaded and pulled pants over hips maxA. Pt then performed supine to sit with CGA and increased time with use of bed features. Performed Stedy transfer to w/c with minA. Pt in new lightweight w/c and was able to propel ~144f with supervision and pt indicating although continues to be difficult was easier to propel. Performed Stedy transfer to high/low mat. Remaining session focused on initiating scoot in preparation for SB transfer. PTA placed yoga blocks under pt's hands and pt performed anterior boosts with blocks 3 x 5. Pt requiring min facilitation to improve positioning and cues to attempt to initiate with BLE. Pt then attempted to perform same activity without yoga blocks with pt unable to fully clear but increase anterior shift and partially lift buttocks off mat. On last attempt pt asked if able to place something under block to change positioning when attempting that boost pt with back spasm and significantly increased pain. Pt was able to tolerate performing Stedy transfer back to w/c in same manner as prior and pain resolved back to baseline after several minutes. Pt transferred back to room and ice pack placed on back for pain  management. Pt left in w/c at end of session with call bell within reach and needs met.      Therapy Documentation Precautions:  Precautions Precautions: Back,Fall Precaution Comments: paraparesis Required Braces or Orthoses: Spinal Brace Spinal Brace: Other (comment) Spinal Brace Comments: Figure 8 Brace Restrictions Weight Bearing Restrictions: No General:   Vital Signs: Therapy Vitals Temp: 98 F (36.7 C) Temp Source: Oral Pulse Rate: 83 Resp: 19 BP: 126/71 Patient Position (if appropriate): Sitting Oxygen Therapy SpO2: 100 % O2 Device: Room Air Pain:   Mobility:   Locomotion :    Trunk/Postural Assessment :    Balance:   Exercises:   Other Treatments:      Therapy/Group: Individual Therapy  Tersea Aulds 08/10/2020, 4:48 PM

## 2020-08-10 NOTE — Progress Notes (Signed)
Randall Mccormick PHYSICAL MEDICINE & REHABILITATION PROGRESS NOTE  Subjective/Complaints:  Saw pt 2x today 1. During rounds- 2nd- family conference.   Said back is better with increase in Oxy- walked 30 ft in parallel bars yesterday-  LBM yesterday, but not with bowel program.   Wife and pt asked about diagnosis, prognosis, specifics on bowel program and how to do, how it works, etc, bladder, spasticity, pain, and how much help she will need to give him.  Also about f/u after d/c, medically- went over these issues- 30 minutes spent directly educating.     ROS:   Pt denies SOB, abd pain, CP, N/V/C/D, and vision changes     Objective: Vital Signs: Blood pressure (!) 129/91, pulse 98, temperature 98.2 F (36.8 C), temperature source Oral, resp. rate 18, height 5\' 8"  (1.727 m), weight 103.6 kg, SpO2 94 %. No results found. No results for input(s): WBC, HGB, HCT, PLT in the last 72 hours. No results for input(s): NA, K, CL, CO2, GLUCOSE, BUN, CREATININE, CALCIUM in the last 72 hours.  Intake/Output Summary (Last 24 hours) at 08/10/2020 1227 Last data filed at 08/10/2020 1100 Gross per 24 hour  Intake 600 ml  Output 1950 ml  Net -1350 ml        Physical Exam: BP (!) 129/91 (BP Location: Left Arm)   Pulse 98   Temp 98.2 F (36.8 C) (Oral)   Resp 18   Ht 5\' 8"  (1.727 m)   Wt 103.6 kg   SpO2 94%   BMI 34.73 kg/m  Constitutional: sitting up in w/c, appropriate, wife in room 2nd time, NAD HEENT: EOMI, oral membranes moist Neck: supple Cardiovascular: RRR- no JVD Respiratory/Chest: CTA B/L- no W/R/R- good air movement GI/Abdomen: S.mla Ext: no clubbing, cyanosis, or edema Psych: less anxious- talking more about participating in therapy Skin: Warm and dry.   Back incision  healed-  Musc: No edema in extremities.  No tenderness in extremities. Neuro: Alert Motor: Bilateral upper extremities: 5/5  Right lower extremity: Hip flexion, knee extension 2+/5, ankle dorsiflexion  4-/5, stable Left lower extremity: Hip flexion, knee extension 2/5, ankle dorsiflexion 4 -/5, stable Sensation diminished sl to light touch bilateral lower extremities    Assessment/Plan: 1. Functional deficits which require 3+ hours per day of interdisciplinary therapy in a comprehensive inpatient rehab setting.  Physiatrist is providing close team supervision and 24 hour management of active medical problems listed below.  Physiatrist and rehab team continue to assess barriers to discharge/monitor patient progress toward functional and medical goals   Care Tool:  Bathing    Body parts bathed by patient: Right arm,Left arm,Chest,Abdomen,Front perineal area,Right upper leg,Left upper leg,Right lower leg,Left lower leg,Face   Body parts bathed by helper: Buttocks     Bathing assist Assist Level: Minimal Assistance - Patient > 75%     Upper Body Dressing/Undressing Upper body dressing   What is the patient wearing?: Pull over shirt    Upper body assist Assist Level: Set up assist    Lower Body Dressing/Undressing Lower body dressing      What is the patient wearing?: Incontinence brief,Pants     Lower body assist Assist for lower body dressing: Total Assistance - Patient < 25%     Toileting Toileting    Toileting assist Assist for toileting: Total Assistance - Patient < 25% (urinal, bed level)     Transfers Chair/bed transfer  Transfers assist     Chair/bed transfer assist level: Maximal Assistance - Patient 25 -  49% (slideboard transfer)     Locomotion Ambulation   Ambulation assist   Ambulation activity did not occur: Safety/medical concerns          Walk 10 feet activity   Assist  Walk 10 feet activity did not occur: Safety/medical concerns        Walk 50 feet activity   Assist Walk 50 feet with 2 turns activity did not occur: Safety/medical concerns         Walk 150 feet activity   Assist Walk 150 feet activity did not occur:  Safety/medical concerns         Walk 10 feet on uneven surface  activity   Assist Walk 10 feet on uneven surfaces activity did not occur: Safety/medical concerns         Wheelchair     Assist Will patient use wheelchair at discharge?: Yes Type of Wheelchair: Manual    Wheelchair assist level: Supervision/Verbal cueing Max wheelchair distance: 150    Wheelchair 50 feet with 2 turns activity    Assist        Assist Level: Supervision/Verbal cueing   Wheelchair 150 feet activity     Assist      Assist Level: Supervision/Verbal cueing    Medical Problem List and Plan: 1.  Bilateral lower extremity weakness with sensory deficits, decreased endurance, poor awareness/lacks insight as well as pain affecting ADLs and mobility secondary to thoracic myelopathy with paraparesis.  Continue CIR PT, OT 2.  Antithrombotics: -DVT/anticoagulation:  Pharmaceutical: Lovenox    Lower extremity Dopplers ordered  2/7- Dopplers (-)- will con't Lovenox             -antiplatelet therapy: N/a 3. Pain Management: Continue oxycodone prn.              --neuropathy BLE and right chest wall-->continue gabapentin 600 mg tid for now.   2/7- will Add Duloxetine 30 mg QHS and decrease Prozac to 20 mg daily since needs help with nerve pain and already at 600 mg TID of gabapentin  2/9- no change in pain, and no side effects or mood getting worse  2/10- no change in weakness/sensation- but pain much worse- will start MS Contin 15 mg BID and maintain other meds per [facility]  2/11-13- pain doing better per pt- will con't MS Contin at this time             2/14- said having back pain this AM, hasn't received AM meds- will wait to change anything  2/15- isn't taking pain meds regularly- -asked him to do so.  2/16- will increase Oxycodone to 15 mg q4 hours prn   2/17- pain doing better- today- explained might have difficulty giving this much pain meds when leaves, due to  Federal law for  first 7 days- will see what we can do.  4. H/o anxiety/Mood: LCSW to follow for evaluation and support.              Prozaac  2/7- decrease to 20 mg daily so has Duloxetine  2/11- no change in affect             -antipsychotic agents: On Zyprexa 5. Neuropsych: This patient is capable of making decisions on his own behalf. 6. Skin/Wound Care: Routine pressure relief measures. Hypoallergenic sheets.   Prevalon boots for both heels ordered 7. Fluids/Electrolytes/Nutrition: Monitor I/Os.    8. Neurogenic bladder:   UA unremarkable, urine culture pending  2/7- will recheck U/A and Cx due to  incontinence AND leukocytosis  2/8- F/U U/A is strongly (+) for UTI- started Keflex 500 mg TID- awaiting Cx- will recheck CBC in AM- wait on stopping Flomax.  2/9- U Cx (+) for Ecoli- pansensitive- WBC down to 14k- will con't Keflex 500 mg TID for a total of 7 days.    2/10- labs in AM- CBC-diff and BMP to make sure things normalizing.  2/11- CBC shows WBC down to 11k- con't Keflex for 7 days total   PVRs  2/13 improving voids (PVR 150-200-438), continent   -continue Flomax, OOB to void  2/14- increased Flomax to 0.8 mg qevening- since required cath x1-  2/15- WBC down to 8.2- con't regimen   2/16- hasn't required cath in last 48 hours  2/17- no caths required since Monday- flomax helping, but "gotta go"- immediately. - helped with urinal x1. con't regimen 9. HTN: Monitor BP   Norvasc  Mildly elevated on 2/6, monitor for trend  2/16- BP 122/77- con't regimen             Monitor with increased mobility 10. Neurogenic bowel:    KUB reviewed, unremarkable             Miralax daily and dulcolax supp w/dig stim after supper.   2/7- says has control, but requiring bowel program?  2/8- Says still needs dig stim, but says has control- didn't have BM last night with program- but will con't because pt thinks he still needs dig stim.   2/9- refused all bowel program x5+ days- explained to pt why he needs it-  should be able to avoid "2 days of stools", but the more he waits, the more chance he will have  A 1-2 day "blowout", which will be due to not emptying gut. He said he would try it.  2/10- refused bowel program again. Incontinent- doesn't understand can't control bowels.  2/13- pt continues to refuse bowel program. Had a frank conversation as to why we're doing it. Pt seemed to listen/understand. We'll see how it goes tonight  2/17- discussed bowel program and how it's to help reduce accidents over 3-6 weeks, not instantly- explained to pt/wife- can also order dig stim stick and suppository inserter as well- explained they are on [place] if wife wants to get them. Con't bowel program- had bowel accident last night- no BM with program.  11.  Hypoalbuminemia  Supplement initiated on 2/5 12.  Transaminitis  Resolved 13.  Leukocytosis  WBC 12.1 on 2/5, labs ordered for tomorrow  2/7- WBC 17.7!- will check U/A and Cx again AND CXR since WBC up so much- not on steroids. Afebrile and says he doesn't feel ill.   2/8- has UTI- denied Sx's of UTI, however has incontinence- Keflex 500 mg TID- will wait for Cx results.   2/9- Cx shows ECOLI- PAN-SENSITIVE- CON'T Keflex  2/11- Keflex x total of 7 days-   2/15- WBC 8.2- resolved 14. Dispo  2/17- family conference today- spent 30 minutes in discussions about medical issues- as detailed about- explained to pt I know he gets scared when confronting something new- explained in stead of hearing I can't- lets try "i'm going to try".    I spent a total of 40 minutes total today on pt- >50% on coordination of care- 30 minutes just on family conference.    LOS: 13 days A FACE TO FACE EVALUATION WAS PERFORMED  Deerica Waszak 08/10/2020, 12:27 PM

## 2020-08-10 NOTE — Progress Notes (Signed)
Physical Therapy Session Note  Patient Details  Name: Randall Mccormick MRN: [id number] Date of Birth: 12/31/1955  Today's Date: 08/10/2020 PT Individual Time: 2355-7322 PT Individual Time Calculation (min): 53 min   Short Term Goals: Week 2:  PT Short Term Goal 1 (Week 2): Pt will perform least restrictive transfer with max A PT Short Term Goal 2 (Week 2): Pt will tolerate standing x 5 min with LRAD PT Short Term Goal 3 (Week 2): Pt will propel w/c x 150 ft with min A PT Short Term Goal 4 (Week 2): Pt will maintain dynamic sitting balance x 5 min with min A  Skilled Therapeutic Interventions/Progress Updates:    Pt received supine in bed and agreeable to therapy session. Reports pain 7/10 at this time and upon encouraging pt to continue working towards scooting pt declines stating when he attempts to scoot it causes a severe pain in his back described as feeling as though a 'knife goes through it." Pt unable to tolerate HOB <20degrees due to increased back pain therefore discussed this and pt states if he is unable to tolerate lying in a regular bed then he will sleep in a powered recliner. Supine>sitting L EOB, HOB partially elevated and using bedrail, via logroll technique with close supervision. Sitting EOB donned shoes max assist for time management. Pt suddenly reports need to use bathroom. Sit>supine via reverse logroll mod assist for B LE management. Max assist for LB clothing management and total assist urinal management - continent of bladder, charted by NT. Returned to sitting as above. Discussed pt's progress and pt stating his LTGs are to advance to at least performing stand pivot transfers using RW to/from w/c - therapist educated pt on option of privately purchasing a stedy if feel that is necessary for safe transfers at home with family; pt remains very motivated to meet his LTGs and progress back to ambulating. Sit>stand EOB>stedy CGA for safety. Dependent stedy transfer to w/c.  Transported to therapy gym in w/c for energy conservation. Sit>stand w/c>// bars x4 during session, pulls up on bars, min assist for lifting/balance - blocking/guarding R knee but no true buckling - cuing for increased hip extension - noted to have significant L LE hyperextension. Gait training 3x in // bars ~6-62ft each with min assist, guarding R knee, and very close +2 assist w/c follow - 1st and 2nd walk pt demos very narrow BOS that improves significantly on 3rd walk after visual demonstration and verbal explanation of impairments (pt reports due to impaired sensation he has a hard time knowing where he is stepping his LEs and how he is placing his feet reporting it feels like he is standing on his toes) - throughout demonstrates significant L LE knee hyperextension during stance, able to advance both LEs during swing without assist but 1x noted excessive R LE hip/knee flexion - throughout, lacks forward progression of his pelvis over the stance limb as well as lacks of forward movement of hands along the bars - cuing throughout for improvement. BUE w/c propulsion ~118ft back to room with close supervision - noted the side guard on the chair rubbing L wheel (fixed) and cued for improved propulsion technique/mechanics for energy conservation and increased push-stroke distance. Cuing to navigate w/c through doorway and how to turn and back up in room next to the bed. Pt left sitting in w/c with needs in reach, seat belt alarm on, and meal tray set-up.   Therapy Documentation Precautions:  Precautions Precautions: Back,Fall,Other (comment) Precaution Comments:  paraparesis Restrictions Weight Bearing Restrictions: No  Pain:  Reports 7/10 pain at rest supine in bed, states received pain medication ~30-45 minutes prior to therapist's arrival. Denies increased pain throughout session.    Therapy/Group: Individual Therapy  Tawana Scale , PT, DPT, CSRS  08/10/2020, 6:44 PM

## 2020-08-10 NOTE — Progress Notes (Signed)
Patient ID: Randall Mccormick, male   DOB: 1956/06/22, 65 y.o.   MRN: [id number]   Patient/Family Conference  Patient/family in attendance: [name] (wife)  Staff in attendance: Dr. Dagoberto Ligas (attending), Phylliss Bob (PT), and Loralee Pacas (SW)  Main focus: to discuss current challenges in rehab, and how to prepare for discharge to home.   Synopsis of information shared: Pt will d/c to home at wheelchair level. Encouragement of pt to participation in bowel program (which has been consistent for last 2 days) to help regulate bowels. Plan is to work towards Mid A at discharge in which he will require assistance with transfers, self-care such as bathing/dressing, toileting, hygiene, etc. Pt is currently Max A with LB bathing, dressing, hygiene, etc. Therapy continue to work with pt on improving strength so he can better help assist with own self-care needs, and transfers.   Barriers/concerns expressed by patient and family: limited supports (only his wife at discharge)  Patient/family response: pt intends to continue to participate in bowel program, and will continue to work with therapy so he can be able to assist his wife with his own self-care needs when home.   Follow-up/action plans: SW will continue to provide updates from team conference, and help coordinate d/c needs.   Loralee Pacas, MSW, [place] Office: [phone] Cell: [phone] Fax: [phone]

## 2020-08-11 DIAGNOSIS — M5104 Intervertebral disc disorders with myelopathy, thoracic region: Secondary | ICD-10-CM | POA: Diagnosis not present

## 2020-08-11 NOTE — Progress Notes (Signed)
[place] PHYSICAL MEDICINE & REHABILITATION PROGRESS NOTE  Subjective/Complaints:  Pt reports slept well- Had BM with bowel program last night- which is great.  Feels better since family conference about plan.    ROS:   Pt denies SOB, abd pain, CP, N/V/C/D, and vision changes    Objective: Vital Signs: Blood pressure 136/75, pulse 84, temperature 98.1 F (36.7 C), temperature source Oral, resp. rate 16, height 5\' 8"  (1.727 m), weight 103.6 kg, SpO2 98 %. No results found. No results for input(s): WBC, HGB, HCT, PLT in the last 72 hours. No results for input(s): NA, K, CL, CO2, GLUCOSE, BUN, CREATININE, CALCIUM in the last 72 hours.  Intake/Output Summary (Last 24 hours) at 08/11/2020 0818 Last data filed at 08/11/2020 5329 Gross per 24 hour  Intake 580 ml  Output 1250 ml  Net -670 ml        Physical Exam: BP 136/75 (BP Location: Right Arm)   Pulse 84   Temp 98.1 F (36.7 C) (Oral)   Resp 16   Ht 5\' 8"  (1.727 m)   Wt 103.6 kg   SpO2 98%   BMI 34.73 kg/m  Constitutional: sitting up in bed eating breakfast, NAD HEENT: EOMI, oral membranes moist Neck: supple Cardiovascular: RRR- no JVD Respiratory/Chest: CTA B/L- no W/R/R- good air movement GI/Abdomen: Soft, NT, ND, (+)BS  Ext: no clubbing, cyanosis, or edema Psych: no anxiety seen this AM Skin: Warm and dry.   Back incision  healed-  Musc: No edema in extremities.  No tenderness in extremities. Neuro: Alert Motor: Bilateral upper extremities: 5/5  Right lower extremity: Hip flexion, knee extension 2+/5, ankle dorsiflexion 4-/5, stable Left lower extremity: Hip flexion, knee extension 2/5, ankle dorsiflexion 4 -/5, stable Sensation diminished sl to light touch bilateral lower extremities    Assessment/Plan: 1. Functional deficits which require 3+ hours per day of interdisciplinary therapy in a comprehensive inpatient rehab setting.  Physiatrist is providing close team supervision and 24 hour management of  active medical problems listed below.  Physiatrist and rehab team continue to assess barriers to discharge/monitor patient progress toward functional and medical goals   Care Tool:  Bathing    Body parts bathed by patient: Right arm,Left arm,Chest,Abdomen,Front perineal area,Right upper leg,Left upper leg,Right lower leg,Left lower leg,Face   Body parts bathed by helper: Buttocks     Bathing assist Assist Level: Minimal Assistance - Patient > 75%     Upper Body Dressing/Undressing Upper body dressing   What is the patient wearing?: Pull over shirt    Upper body assist Assist Level: Set up assist    Lower Body Dressing/Undressing Lower body dressing      What is the patient wearing?: Incontinence brief,Pants     Lower body assist Assist for lower body dressing: Total Assistance - Patient < 25%     Toileting Toileting    Toileting assist Assist for toileting: Total Assistance - Patient < 25% (urinal, bed level)     Transfers Chair/bed transfer  Transfers assist     Chair/bed transfer assist level: Dependent - mechanical lift (stedy)     Locomotion Ambulation   Ambulation assist   Ambulation activity did not occur: Safety/medical concerns  Assist level: 2 helpers (min A of 1 and +2 w/c follow) Assistive device: Parallel bars Max distance: 62ft   Walk 10 feet activity   Assist  Walk 10 feet activity did not occur: Safety/medical concerns        Walk 50 feet activity  Assist Walk 50 feet with 2 turns activity did not occur: Safety/medical concerns         Walk 150 feet activity   Assist Walk 150 feet activity did not occur: Safety/medical concerns         Walk 10 feet on uneven surface  activity   Assist Walk 10 feet on uneven surfaces activity did not occur: Safety/medical concerns         Wheelchair     Assist Will patient use wheelchair at discharge?: Yes Type of Wheelchair: Manual    Wheelchair assist level:  Supervision/Verbal cueing,Set up assist Max wheelchair distance: 159ft    Wheelchair 50 feet with 2 turns activity    Assist        Assist Level: Supervision/Verbal cueing   Wheelchair 150 feet activity     Assist      Assist Level: Supervision/Verbal cueing    Medical Problem List and Plan: 1.  Bilateral lower extremity weakness with sensory deficits, decreased endurance, poor awareness/lacks insight as well as pain affecting ADLs and mobility secondary to thoracic myelopathy with paraparesis.  Continue CIR PT, OT 2.  Antithrombotics: -DVT/anticoagulation:  Pharmaceutical: Lovenox    Lower extremity Dopplers ordered  2/7- Dopplers (-)- will con't Lovenox             -antiplatelet therapy: N/a 3. Pain Management: Continue oxycodone prn.              --neuropathy BLE and right chest wall-->continue gabapentin 600 mg tid for now.   2/7- will Add Duloxetine 30 mg QHS and decrease Prozac to 20 mg daily since needs help with nerve pain and already at 600 mg TID of gabapentin  2/16- will increase Oxycodone to 15 mg q4 hours prn   2/17- pain doing better- today- explained might have difficulty giving this much pain meds when leaves, due to  [employer] for first 7 days- will see what we can do.   2/18- pain doing well/better with increase of Oxycodone- con't regimen 4. H/o anxiety/Mood: LCSW to follow for evaluation and support.              Prozaac  2/7- decrease to 20 mg daily so has Duloxetine  2/11- no change in affect  2/18- since on prozac, cannot increase Duloxetine             -antipsychotic agents: On Zyprexa 5. Neuropsych: This patient is capable of making decisions on his own behalf. 6. Skin/Wound Care: Routine pressure relief measures. Hypoallergenic sheets.   Prevalon boots for both heels ordered 7. Fluids/Electrolytes/Nutrition: Monitor I/Os.    8. Neurogenic bladder:   UA unremarkable, urine culture pending  2/7- will recheck U/A and Cx due to incontinence  AND leukocytosis  2/8- F/U U/A is strongly (+) for UTI- started Keflex 500 mg TID- awaiting Cx- will recheck CBC in AM- wait on stopping Flomax.  2/9- U Cx (+) for Ecoli- pansensitive- WBC down to 14k- will con't Keflex 500 mg TID for a total of 7 days.    2/10- labs in AM- CBC-diff and BMP to make sure things normalizing.  2/11- CBC shows WBC down to 11k- con't Keflex for 7 days total   PVRs  2/13 improving voids (PVR 150-200-438), continent   -continue Flomax, OOB to void  2/14- increased Flomax to 0.8 mg qevening- since required cath x1-  2/15- WBC down to 8.2- con't regimen   2/16- hasn't required cath in last 48 hours  2/17- no caths  required since Monday- flomax helping, but "gotta go"- immediately. - helped with urinal x1. con't regimen 9. HTN: Monitor BP   Norvasc  Mildly elevated on 2/6, monitor for trend  2/16- BP 122/77- con't regimen  2/18- BP 136/70s- con't regimen             Monitor with increased mobility 10. Neurogenic bowel:    KUB reviewed, unremarkable             Miralax daily and dulcolax supp w/dig stim after supper.   2/7- says has control, but requiring bowel program?  2/8- Says still needs dig stim, but says has control- didn't have BM last night with program- but will con't because pt thinks he still needs dig stim.   2/9- refused all bowel program x5+ days- explained to pt why he needs it- should be able to avoid "2 days of stools", but the more he waits, the more chance he will have  A 1-2 day "blowout", which will be due to not emptying gut. He said he would try it.  2/10- refused bowel program again. Incontinent- doesn't understand can't control bowels.  2/13- pt continues to refuse bowel program. Had a frank conversation as to why we're doing it. Pt seemed to listen/understand. We'll see how it goes tonight  2/17- discussed bowel program and how it's to help reduce accidents over 3-6 weeks, not instantly- explained to pt/wife- can also order dig stim stick  and suppository inserter as well- explained they are on [place] if wife wants to get them. Con't bowel program- had bowel accident last night- no BM with program.  2/18- had BM with bowel program last night- starting to work better- con't regimen  11.  Hypoalbuminemia  Supplement initiated on 2/5 12.  Transaminitis  Resolved 13.  Leukocytosis  WBC 12.1 on 2/5, labs ordered for tomorrow  2/7- WBC 17.7!- will check U/A and Cx again AND CXR since WBC up so much- not on steroids. Afebrile and says he doesn't feel ill.   2/8- has UTI- denied Sx's of UTI, however has incontinence- Keflex 500 mg TID- will wait for Cx results.   2/9- Cx shows ECOLI- PAN-SENSITIVE- CON'T Keflex  2/11- Keflex x total of 7 days-   2/15- WBC 8.2- resolved 14. Dispo  2/17- family conference today- spent 30 minutes in discussions about medical issues- as detailed about- explained to pt I know he gets scared when confronting something new- explained in stead of hearing I can't- lets try "i'm going to try".      LOS: 14 days A FACE TO FACE EVALUATION WAS PERFORMED  Nonie Lochner 08/11/2020, 8:18 AM

## 2020-08-11 NOTE — Progress Notes (Signed)
Occupational Therapy Session Note  Patient Details  Name: Randall Mccormick MRN: [id number] Date of Birth: 10/20/1955  Today's Date: 08/11/2020 OT Individual Time: 1355-1433 OT Individual Time Calculation (min): 38 min    Short Term Goals: Week 2:  OT Short Term Goal 1 (Week 2): Pt will complete BSC transfer with LRAD and 2 assist OT Short Term Goal 2 (Week 2): Pt will complete LB dressing with mod A using AE PRN OT Short Term Goal 3 (Week 2): Pt will complete toileting tasks with max A OT Short Term Goal 4 (Week 2): Pt will perform UB dressing tasks with min A  Skilled Therapeutic Interventions/Progress Updates:    Pt received in therapy gym finishing up with PTA, agreeable to OT, but req to return to room 2/2 urinary incontinence. C/o of back pain throughout session, did not quantify and denies request for rx at this time. W/c transport back to room, Stedy transfer to bed with min A to power up. Total A to doff B shoes/socks. Sitting EOB > supine with mod A to bring BLE onto bed. Doffed brief/shorts with total A at bed level, rolls R and L with min A to assist. Set-up A for anterior peri care when semi-reclined. Req to use urinal, total A to place and hold. Redonned brief/shorts same manner as above. Supine > sitting EOB via logroll, mod A to bring LLE off bed and to lift trunk. In prep for improved functional transfer/activity tolerance, pt completed 1x10 of the following with 5 lb dowel rod: forward/backward rows, B shoulder flexion to 90 degrees, and bicep curls. Req rest break between each exercise, requesting to return to bed 2/2 back pain. Transition to supine same manner as before, does not tolerate laying on back 2/2 pain. Total A + 2 to scoot up in bed. Pt left semi-reclined in bed with NT present, call bell in reach, and bed alarm engaged.   Therapy Documentation Precautions:  Precautions Precautions: Back,Fall,Other (comment) Precaution Comments: paraparesis Required Braces or  Orthoses: Spinal Brace Spinal Brace: Other (comment) Spinal Brace Comments: Figure 8 Brace Restrictions Weight Bearing Restrictions: No Pain: Pain Assessment Pain Scale: Faces Faces Pain Scale: Hurts little more Pain Type: Surgical pain Pain Location: Back Pain Onset: On-going Pain Intervention(s): Rest;Repositioned ADL: See Care Tool for more details.  Therapy/Group: Individual Therapy  Volanda Napoleon MS, OTR/L  08/11/2020, 2:41 PM

## 2020-08-11 NOTE — Progress Notes (Signed)
Physical Therapy Session Note  Patient Details  Name: Randall Mccormick MRN: [id number] Date of Birth: 01-Feb-1956  Today's Date: 08/11/2020 PT Individual Time: 1105-1200 and 1305-1400 PT Individual Time Calculation (min): 55 min and 55 min  Short Term Goals: Week 2:  PT Short Term Goal 1 (Week 2): Pt will perform least restrictive transfer with max A PT Short Term Goal 2 (Week 2): Pt will tolerate standing x 5 min with LRAD PT Short Term Goal 3 (Week 2): Pt will propel w/c x 150 ft with min A PT Short Term Goal 4 (Week 2): Pt will maintain dynamic sitting balance x 5 min with min A  Skilled Therapeutic Interventions/Progress Updates: Tx1: Pt presented in bed agreeable to therapy. Pt states pain currently 4/10 thoracic spine and states recently premedicated. Pt performed rolling L/R with CGA and bed rail as PTA pulled pants over hips total A. Performed supine to sit with CGA and use of bed features. Pt requesting to use urinal and PTA provided total A for urinal management (+urinary void). Performed Stedy transfer to w/c with pt performing STS from elevated bed with minA and heavy reliance on BUE. Pt with skin tear on hand while performing stedy transfer with PTA applying band aid and nsg aware. Pt propelled ~164f with supervision and PTA transported remaining distance to day room. Pt participated in Cybex Kinetron 60cm/sec 4 x 2 min bouts for glute/hamstring strengthening with PTA providing verbal cues to push through full range for maximal effect. Pt then performed LAQ with 5 sec hold x 10 bilaterally. Pt transported back to room and expressed need for brief change. [place]. Pt left in w/c at end of session with belt alarm on, call bell within reach and needs met.   Tx2: Pt presented in w/c agreeable to therapy. PTA donned shoes total A for time management. Pt transported to rehab gym for energy conservation. Performed Stedy transfer to high/low mat. Pt  participated in STS from elevated mat (22 in). Pt performed STS x 8 requiring min/modA to achieve full stand. Pt required moderate tactile cues to achieve extension in knees R>L and facilitation at hips for improved postural alignment. Stands 6-8 pt demonstrated improved posture and active engagement of BLE. Once completed performed Stedy transfer to w/c in same manner as prior. Pt then [place] session.      Therapy Documentation Precautions:  Precautions Precautions: Back,Fall,Other (comment) Precaution Comments: paraparesis Required Braces or Orthoses: Spinal Brace Spinal Brace: Other (comment) Spinal Brace Comments: Figure 8 Brace Restrictions Weight Bearing Restrictions: No General:   Vital Signs: Therapy Vitals Temp: 98.6 F (37 C) Temp Source: Oral Pulse Rate: 98 Resp: 17 BP: 117/69 Patient Position (if appropriate): Lying Oxygen Therapy SpO2: 96 % O2 Device: Room Air Pain: Pain Assessment Pain Scale: Faces Faces Pain Scale: Hurts little more Pain Type: Surgical pain Pain Location: Back Pain Onset: On-going Pain Intervention(s): Rest;Repositioned    Therapy/Group: Individual Therapy  Charlye Spare 08/11/2020, 4:00 PM

## 2020-08-11 NOTE — Progress Notes (Signed)
Occupational Therapy Weekly Progress Note  Patient Details  Name: Randall Mccormick MRN: [id number] Date of Birth: December 26, 1955  Beginning of progress report period: August 04, 2020 End of progress report period: August 11, 2020  Patient has met 1 of 4 short term goals.  Pt progress with BADLs has been minimal over the past week. Pt has demonstrated improvement in sitting balance, sit<>stand in [place], UB dressing, and bathing tasks at shower level. Pt continues to require max A for LB dressing and tot A/dependent for toileting tasks. All tranfsers are with Stedy. Min A/supervision for sit<>stand with Stedy.UB dressing with supervision. Family meeting during the past week with focus on expected therapy outcomes and assist level goals to enable d/c home.   Patient continues to demonstrate the following deficits: muscle weakness, decreased cardiorespiratoy endurance, impaired timing and sequencing, unbalanced muscle activation and decreased coordination and decreased standing balance, decreased postural control and decreased balance strategies and therefore will continue to benefit from skilled OT intervention to enhance overall performance with BADL and Reduce care partner burden.  Patient progressing toward long term goals..  Continue plan of care.  OT Short Term Goals Week 2:  OT Short Term Goal 1 (Week 2): Pt will complete BSC transfer with LRAD and 2 assist OT Short Term Goal 1 - Progress (Week 2): Progressing toward goal OT Short Term Goal 2 (Week 2): Pt will complete LB dressing with mod A using AE PRN OT Short Term Goal 2 - Progress (Week 2): Progressing toward goal OT Short Term Goal 3 (Week 2): Pt will complete toileting tasks with max A OT Short Term Goal 3 - Progress (Week 2): Progressing toward goal OT Short Term Goal 4 (Week 2): Pt will perform UB dressing tasks with min A OT Short Term Goal 4 - Progress (Week 2): Met Week 3:  OT Short Term Goal 2 (Week 3): Pt will complete BSC  transfer with LRAD and 2 assist OT Short Term Goal 3 (Week 3): Pt will complete LB dressing with mod A using AE PRN OT Short Term Goal 4 (Week 3): Pt will complete toileting tasks with max A      Leroy Libman 08/11/2020, 2:52 PM

## 2020-08-11 NOTE — Progress Notes (Signed)
Occupational Therapy Session Note  Patient Details  Name: Randall Mccormick MRN: [id number] Date of Birth: 22-Jan-1956  Today's Date: 08/11/2020 OT Individual Time: 0800-0900 OT Individual Time Calculation (min): 60 min    Short Term Goals: Week 2:  OT Short Term Goal 1 (Week 2): Pt will complete BSC transfer with LRAD and 2 assist OT Short Term Goal 2 (Week 2): Pt will complete LB dressing with mod A using AE PRN OT Short Term Goal 3 (Week 2): Pt will complete toileting tasks with max A OT Short Term Goal 4 (Week 2): Pt will perform UB dressing tasks with min A  Skilled Therapeutic Interventions/Progress Updates:    Pt resting in bed upon arrival. OT intervention with focus on bed mobility, sitting balance, toileting, bathing at shower level, dressing with sit<>stand from w/c with Stedy, and activity tolerance to increase independence with BADLs. Supine>sit EOB with supervision. Sit<>stand in [place] CGA throughout session. Dependent for toileting and toilet transfers with [facility]. Bathing with min A seated on BSC in shower. LB dressing with tot A. Pt requested to return to bed. Max A for sit>supine. Pt assisted with repositioning in bed. Pt reamined in bed with all needs within reach and bed alarm activated.  Therapy Documentation Precautions:  Precautions Precautions: Back,Fall,Other (comment) Precaution Comments: paraparesis Required Braces or Orthoses: Spinal Brace Spinal Brace: Other (comment) Spinal Brace Comments: Figure 8 Brace Restrictions Weight Bearing Restrictions: No Pain: Pt c/o 4/10 back pain; premedicated and shower  Therapy/Group: Individual Therapy  Leroy Libman 08/11/2020, 9:33 AM

## 2020-08-12 DIAGNOSIS — M5104 Intervertebral disc disorders with myelopathy, thoracic region: Secondary | ICD-10-CM | POA: Diagnosis not present

## 2020-08-12 NOTE — Progress Notes (Signed)
[facility] PHYSICAL MEDICINE & REHABILITATION PROGRESS NOTE  Subjective/Complaints: No complaints Pain is well controlled Vitals are stable   ROS:   Pt denies SOB, abd pain, CP, N/V/C/D, and vision changes, pain   Objective: Vital Signs: Blood pressure 129/86, pulse 88, temperature 98 F (36.7 C), temperature source Oral, resp. rate 18, height 5\' 8"  (1.727 m), weight 103.6 kg, SpO2 92 %. No results found. No results for input(s): WBC, HGB, HCT, PLT in the last 72 hours. No results for input(s): NA, K, CL, CO2, GLUCOSE, BUN, CREATININE, CALCIUM in the last 72 hours.  Intake/Output Summary (Last 24 hours) at 08/12/2020 1053 Last data filed at 08/12/2020 0820 Gross per 24 hour  Intake 954 ml  Output 1400 ml  Net -446 ml        Physical Exam: BP 129/86 (BP Location: Left Arm)   Pulse 88   Temp 98 F (36.7 C) (Oral)   Resp 18   Ht 5\' 8"  (1.727 m)   Wt 103.6 kg   SpO2 92%   BMI 34.73 kg/m  Gen: no distress, normal appearing HEENT: oral mucosa pink and moist, NCAT Cardio: Reg rate Chest: normal effort, normal rate of breathing Abd: soft, non-distended Ext: no edema Psych: no anxiety seen this AM Skin: Warm and dry.   Back incision  healed-  Musc: No edema in extremities.  No tenderness in extremities. Neuro: Alert Motor: Bilateral upper extremities: 5/5  Right lower extremity: Hip flexion, knee extension 2+/5, ankle dorsiflexion 4-/5, stable Left lower extremity: Hip flexion, knee extension 2/5, ankle dorsiflexion 4 -/5, stable Sensation diminished sl to light touch bilateral lower extremities    Assessment/Plan: 1. Functional deficits which require 3+ hours per day of interdisciplinary therapy in a comprehensive inpatient rehab setting.  Physiatrist is providing close team supervision and 24 hour management of active medical problems listed below.  Physiatrist and rehab team continue to assess barriers to discharge/monitor patient progress toward functional and  medical goals   Care Tool:  Bathing    Body parts bathed by patient: Right arm,Left arm,Chest,Abdomen,Front perineal area,Right upper leg,Left upper leg,Face   Body parts bathed by helper: Buttocks,Right lower leg,Left lower leg     Bathing assist Assist Level: Minimal Assistance - Patient > 75%     Upper Body Dressing/Undressing Upper body dressing   What is the patient wearing?: Pull over shirt    Upper body assist Assist Level: Set up assist    Lower Body Dressing/Undressing Lower body dressing      What is the patient wearing?: Incontinence brief,Pants     Lower body assist Assist for lower body dressing: Total Assistance - Patient < 25%     Toileting Toileting    Toileting assist Assist for toileting: Set up assist Assistive Device Comment: pericare after urinal   Transfers Chair/bed transfer  Transfers assist     Chair/bed transfer assist level: Dependent - mechanical lift (stedy)     Locomotion Ambulation   Ambulation assist   Ambulation activity did not occur: Safety/medical concerns  Assist level: 2 helpers (min A of 1 and +2 w/c follow) Assistive device: Parallel bars Max distance: 83ft   Walk 10 feet activity   Assist  Walk 10 feet activity did not occur: Safety/medical concerns        Walk 50 feet activity   Assist Walk 50 feet with 2 turns activity did not occur: Safety/medical concerns         Walk 150 feet activity  Assist Walk 150 feet activity did not occur: Safety/medical concerns         Walk 10 feet on uneven surface  activity   Assist Walk 10 feet on uneven surfaces activity did not occur: Safety/medical concerns         Wheelchair     Assist Will patient use wheelchair at discharge?: Yes Type of Wheelchair: Manual    Wheelchair assist level: Supervision/Verbal cueing,Set up assist Max wheelchair distance: 170ft    Wheelchair 50 feet with 2 turns activity    Assist        Assist  Level: Supervision/Verbal cueing   Wheelchair 150 feet activity     Assist      Assist Level: Supervision/Verbal cueing    Medical Problem List and Plan: 1.  Bilateral lower extremity weakness with sensory deficits, decreased endurance, poor awareness/lacks insight as well as pain affecting ADLs and mobility secondary to thoracic myelopathy with paraparesis.  Continue CIR PT, OT 2.  Antithrombotics: -DVT/anticoagulation:  Pharmaceutical: Lovenox    Lower extremity Dopplers ordered  Dopplers (-)- will continue Lovenox             -antiplatelet therapy: N/a 3. Pain Management: Continue oxycodone prn.              --neuropathy BLE and right chest wall-->continue gabapentin 600 mg tid for now.   2/7- will Add Duloxetine 30 mg QHS and decrease Prozac to 20 mg daily since needs help with nerve pain and already at 600 mg TID of gabapentin  2/16- will increase Oxycodone to 15 mg q4 hours prn   2/17- pain doing better- today- explained might have difficulty giving this much pain meds when leaves, due to  [employer] for first 7 days- will see what we can do.   2/18- pain doing well/better with increase of Oxycodone- con't regimen 4. H/o anxiety/Mood: LCSW to follow for evaluation and support.              Prozaac  2/7- decrease to 20 mg daily so has Duloxetine  2/11- no change in affect  2/18- since on prozac, cannot increase Duloxetine             -antipsychotic agents: Continue Zyprexa 5. Neuropsych: This patient is capable of making decisions on his own behalf. 6. Skin/Wound Care: Routine pressure relief measures. Hypoallergenic sheets.   Prevalon boots for both heels ordered 7. Fluids/Electrolytes/Nutrition: Monitor I/Os.    8. Neurogenic bladder:   UA unremarkable, urine culture pending  2/7- will recheck U/A and Cx due to incontinence AND leukocytosis  2/8- F/U U/A is strongly (+) for UTI- started Keflex 500 mg TID- awaiting Cx- will recheck CBC in AM- wait on stopping  Flomax.  2/9- U Cx (+) for Ecoli- pansensitive- WBC down to 14k- will con't Keflex 500 mg TID for a total of 7 days.    2/10- labs in AM- CBC-diff and BMP to make sure things normalizing.  2/11- CBC shows WBC down to 11k- con't Keflex for 7 days total   PVRs  2/13 improving voids (PVR 150-200-438), continent   -continue Flomax, OOB to void  2/14- increased Flomax to 0.8 mg qevening- since required cath x1-  2/15- WBC down to 8.2- con't regimen   2/16- hasn't required cath in last 48 hours  2/17- no caths required since Monday- flomax helping, but "gotta go"- immediately. - helped with urinal x1. con't regimen 9. HTN: Monitor BP   Norvasc  Mildly elevated on 2/6,  monitor for trend  2/16- BP 122/77- con't regimen  2/19- BP 129/86- continue regimen             Monitor with increased mobility 10. Neurogenic bowel:    KUB reviewed, unremarkable             Miralax daily and dulcolax supp w/dig stim after supper.   2/7- says has control, but requiring bowel program?  2/8- Says still needs dig stim, but says has control- didn't have BM last night with program- but will con't because pt thinks he still needs dig stim.   2/9- refused all bowel program x5+ days- explained to pt why he needs it- should be able to avoid "2 days of stools", but the more he waits, the more chance he will have  A 1-2 day "blowout", which will be due to not emptying gut. He said he would try it.  2/10- refused bowel program again. Incontinent- doesn't understand can't control bowels.  2/13- pt continues to refuse bowel program. Had a frank conversation as to why we're doing it. Pt seemed to listen/understand. We'll see how it goes tonight  2/17- discussed bowel program and how it's to help reduce accidents over 3-6 weeks, not instantly- explained to pt/wife- can also order dig stim stick and suppository inserter as well- explained they are on [place] if wife wants to get them. Con't bowel program- had bowel accident last  night- no BM with program.  2/18- had BM with bowel program last night- starting to work better- con't regimen  11.  Hypoalbuminemia  Supplement initiated on 2/5 12.  Transaminitis  Resolved 13.  Leukocytosis  WBC 12.1 on 2/5, labs ordered for tomorrow  2/7- WBC 17.7!- will check U/A and Cx again AND CXR since WBC up so much- not on steroids. Afebrile and says he doesn't feel ill.   2/8- has UTI- denied Sx's of UTI, however has incontinence- Keflex 500 mg TID- will wait for Cx results.   2/9- Cx shows ECOLI- PAN-SENSITIVE- CON'T Keflex  2/11- Keflex x total of 7 days-   2/15- WBC 8.2- resolved 14. Dispo  2/17- family conference today- spent 30 minutes in discussions about medical issues- as detailed about- explained to pt I know he gets scared when confronting something new- explained in stead of hearing I can't- lets try "i'm going to try".      LOS: 15 days A FACE TO FACE EVALUATION WAS PERFORMED  Clide Deutscher Saniya Tranchina 08/12/2020, 10:53 AM

## 2020-08-13 DIAGNOSIS — M5104 Intervertebral disc disorders with myelopathy, thoracic region: Secondary | ICD-10-CM | POA: Diagnosis not present

## 2020-08-13 NOTE — Progress Notes (Signed)
Occupational Therapy Session Note  Patient Details  Name: Randall Mccormick MRN: [id number] Date of Birth: [birth year]  Today's Date: 08/13/2020 OT Individual Time: [phone] OT Individual Time Calculation (min): 58 min    Short Term Goals: Week 3:  OT Short Term Goal 2 (Week 3): Pt will complete BSC transfer with LRAD and 2 assist OT Short Term Goal 3 (Week 3): Pt will complete LB dressing with mod A using AE PRN OT Short Term Goal 4 (Week 3): Pt will complete toileting tasks with max A  Skilled Therapeutic Interventions/Progress Updates:    OT intervention with focus on bathing at shower level, toileting, dressing at w/c level, bed mobility, standing balance, and activity tolerance to increase independence with BADLs. Pt used Stedy for all transfers and sit<>stand/standing. Min A for sit<>stand with pt relying significantly on BUE. Pt dependent for toileting tasks. Pt min/mod A for bathing at shower level. Pt tot A/max A for LB dressing tasks. Pt returned to bed (per request) and remained in bed with all needs within reach and bed alarm activated.   Therapy Documentation Precautions:  Precautions Precautions: Back,Fall,Other (comment) Precaution Comments: paraparesis Required Braces or Orthoses: Spinal Brace Spinal Brace: Other (comment) Spinal Brace Comments: Figure 8 Brace Restrictions Weight Bearing Restrictions: No   Pain:  Pt c/o 3/10 low back pain; shower and repositioned  Therapy/Group: Individual Therapy  Leroy Libman 08/13/2020, 9:59 AM

## 2020-08-13 NOTE — Progress Notes (Signed)
Physical Therapy Session Note  Patient Details  Name: Randall Mccormick MRN: [id number] Date of Birth: 12/15/1955  Today's Date: 08/13/2020 PT Individual Time: 3009-2330 PT Individual Time Calculation (min): 45 min   Short Term Goals: Week 2:  PT Short Term Goal 1 (Week 2): Pt will perform least restrictive transfer with max A PT Short Term Goal 2 (Week 2): Pt will tolerate standing x 5 min with LRAD PT Short Term Goal 3 (Week 2): Pt will propel w/c x 150 ft with min A PT Short Term Goal 4 (Week 2): Pt will maintain dynamic sitting balance x 5 min with min A  Skilled Therapeutic Interventions/Progress Updates:    Pt received seated in bed, agreeable and eager to participate in therapy session. Pt reports pain is well-controlled this date. Assisted pt with donning pants at bed level via rolling with min A and use of bedrails. Supine to sitting EOB at Supervision level with use of bedrails and HOB elevated. Assisted pt with donning shoes while seated EOB. Sit to stand with CGA to min A to stedy. Stedy transfer to w/c. Manual w/c propulsion x 150 ft with use of BUE at Supervision level. Pt exhibits improved ability to maneuver ultra light weight manual wheelchair as compared to lightweight w/c. Stedy transfer to/from mat table. Session focus on sit to stand to [facility] walker from elevated mat table with B knees blocked. Pt continues to require heavy UE support in standing, use of mirror for visual feedback for upright posture. Pt requires up to mod A to stand to [employer]. Attempt mini-squats in standing, pt initially exhibits knee buckling L>R but eventually able to perform several mini-squats before onset of fatigue. Pt also tends to keep weight on heels/posteriorly, encouraged anterior weight shift during standing. Pt reports urge to use the bathroom at end of session. Stedy transfer to elevated BSC over toilet. Pt requires assist for clothing management. Pt left seated on toilet in bathroom with call button  in reach, NT aware of pt's location.  Therapy Documentation Precautions:  Precautions Precautions: Back,Fall,Other (comment) Precaution Comments: paraparesis Required Braces or Orthoses: Spinal Brace Spinal Brace: Other (comment) Spinal Brace Comments: Figure 8 Brace Restrictions Weight Bearing Restrictions: No   Therapy/Group: Individual Therapy   Excell Seltzer, PT, DPT  08/13/2020, 5:34 PM

## 2020-08-13 NOTE — Progress Notes (Signed)
[place] PHYSICAL MEDICINE & REHABILITATION PROGRESS NOTE  Subjective/Complaints: No issues overnight Vitals signs stable No complaints this morning  ROS:   Pt denies SOB, abd pain, CP, N/V/C/D, and vision changes, pain   Objective: Vital Signs: Blood pressure 136/86, pulse 88, temperature 98 F (36.7 C), resp. rate 17, height 5\' 8"  (1.727 m), weight 103.6 kg, SpO2 96 %. No results found. No results for input(s): WBC, HGB, HCT, PLT in the last 72 hours. No results for input(s): NA, K, CL, CO2, GLUCOSE, BUN, CREATININE, CALCIUM in the last 72 hours.  Intake/Output Summary (Last 24 hours) at 08/13/2020 0702 Last data filed at 08/13/2020 0424 Gross per 24 hour  Intake 828 ml  Output 1575 ml  Net -747 ml        Physical Exam: BP 136/86   Pulse 88   Temp 98 F (36.7 C)   Resp 17   Ht 5\' 8"  (1.727 m)   Wt 103.6 kg   SpO2 96%   BMI 34.73 kg/m  Gen: no distress, normal appearing HEENT: oral mucosa pink and moist, NCAT Cardio: Reg rate Chest: normal effort, normal rate of breathing Abd: soft, non-distended Ext: no edema Psych: pleasant, normal affect Skin: intact Motor: Bilateral upper extremities: 5/5  Right lower extremity: Hip flexion, knee extension 2+/5, ankle dorsiflexion 4-/5, stable Left lower extremity: Hip flexion, knee extension 2/5, ankle dorsiflexion 4 -/5, stable Sensation diminished sl to light touch bilateral lower extremities    Assessment/Plan: 1. Functional deficits which require 3+ hours per day of interdisciplinary therapy in a comprehensive inpatient rehab setting.  Physiatrist is providing close team supervision and 24 hour management of active medical problems listed below.  Physiatrist and rehab team continue to assess barriers to discharge/monitor patient progress toward functional and medical goals   Care Tool:  Bathing    Body parts bathed by patient: Right arm,Left arm,Chest,Abdomen,Front perineal area,Right upper leg,Left upper  leg,Face   Body parts bathed by helper: Buttocks,Right lower leg,Left lower leg     Bathing assist Assist Level: Minimal Assistance - Patient > 75%     Upper Body Dressing/Undressing Upper body dressing   What is the patient wearing?: Pull over shirt    Upper body assist Assist Level: Set up assist    Lower Body Dressing/Undressing Lower body dressing      What is the patient wearing?: Incontinence brief,Pants     Lower body assist Assist for lower body dressing: Total Assistance - Patient < 25%     Toileting Toileting    Toileting assist Assist for toileting: Set up assist Assistive Device Comment: pericare after urinal   Transfers Chair/bed transfer  Transfers assist     Chair/bed transfer assist level: Dependent - mechanical lift (stedy)     Locomotion Ambulation   Ambulation assist   Ambulation activity did not occur: Safety/medical concerns  Assist level: 2 helpers (min A of 1 and +2 w/c follow) Assistive device: Parallel bars Max distance: 60ft   Walk 10 feet activity   Assist  Walk 10 feet activity did not occur: Safety/medical concerns        Walk 50 feet activity   Assist Walk 50 feet with 2 turns activity did not occur: Safety/medical concerns         Walk 150 feet activity   Assist Walk 150 feet activity did not occur: Safety/medical concerns         Walk 10 feet on uneven surface  activity   Assist Walk 10  feet on uneven surfaces activity did not occur: Safety/medical concerns         Wheelchair     Assist Will patient use wheelchair at discharge?: Yes Type of Wheelchair: Manual    Wheelchair assist level: Supervision/Verbal cueing,Set up assist Max wheelchair distance: 130ft    Wheelchair 50 feet with 2 turns activity    Assist        Assist Level: Supervision/Verbal cueing   Wheelchair 150 feet activity     Assist      Assist Level: Supervision/Verbal cueing    Medical Problem List  and Plan: 1.  Bilateral lower extremity weakness with sensory deficits, decreased endurance, poor awareness/lacks insight as well as pain affecting ADLs and mobility secondary to thoracic myelopathy with paraparesis.  Continue CIR PT, OT 2.  Antithrombotics: -DVT/anticoagulation:  Pharmaceutical: Lovenox    Lower extremity Dopplers ordered  Dopplers (-)- will continue Lovenox             -antiplatelet therapy: N/a 3. Pain Management: Continue oxycodone prn.              --neuropathy BLE and right chest wall-->continue gabapentin 600 mg tid for now.   2/7- will Add Duloxetine 30 mg QHS and decrease Prozac to 20 mg daily since needs help with nerve pain and already at 600 mg TID of gabapentin  2/16- will increase Oxycodone to 15 mg q4 hours prn   2/17- pain doing better- today- explained might have difficulty giving this much pain meds when leaves, due to  [employer] for first 7 days- will see what we can do.   2/18- pain doing well/better with increase of Oxycodone- con't regimen 4. H/o anxiety/Mood: LCSW to follow for evaluation and support.              Prozaac  2/7- decrease to 20 mg daily so has Duloxetine  2/11- no change in affect  2/18- since on prozac, cannot increase Duloxetine             -antipsychotic agents: Continue Zyprexa 5. Neuropsych: This patient is capable of making decisions on his own behalf. 6. Skin/Wound Care: Routine pressure relief measures. Hypoallergenic sheets.   Prevalon boots for both heels ordered 7. Fluids/Electrolytes/Nutrition: Monitor I/Os.    8. Neurogenic bladder:   UA unremarkable, urine culture pending  2/7- will recheck U/A and Cx due to incontinence AND leukocytosis  2/8- F/U U/A is strongly (+) for UTI- started Keflex 500 mg TID- awaiting Cx- will recheck CBC in AM- wait on stopping Flomax.  2/9- U Cx (+) for Ecoli- pansensitive- WBC down to 14k- will con't Keflex 500 mg TID for a total of 7 days.    2/10- labs in AM- CBC-diff and BMP to make  sure things normalizing.  2/11- CBC shows WBC down to 11k- con't Keflex for 7 days total   PVRs  2/13 improving voids (PVR 150-200-438), continent   -continue Flomax, OOB to void  2/14- increased Flomax to 0.8 mg qevening- since required cath x1-  2/15- WBC down to 8.2- con't regimen   2/16- hasn't required cath in last 48 hours  2/17- no caths required since Monday- flomax helping, but "gotta go"- immediately. - helped with urinal x1. con't regimen 9. HTN: Monitor BP   Norvasc  Mildly elevated on 2/6, monitor for trend  2/16- BP 122/77- con't regimen  2/20- BP 136/86- contine regimen             Monitor with increased mobility  10. Neurogenic bowel:    KUB reviewed, unremarkable             Miralax daily and dulcolax supp w/dig stim after supper.   2/7- says has control, but requiring bowel program?  2/8- Says still needs dig stim, but says has control- didn't have BM last night with program- but will con't because pt thinks he still needs dig stim.   2/9- refused all bowel program x5+ days- explained to pt why he needs it- should be able to avoid "2 days of stools", but the more he waits, the more chance he will have  A 1-2 day "blowout", which will be due to not emptying gut. He said he would try it.  2/10- refused bowel program again. Incontinent- doesn't understand can't control bowels.  2/13- pt continues to refuse bowel program. Had a frank conversation as to why we're doing it. Pt seemed to listen/understand. We'll see how it goes tonight  2/17- discussed bowel program and how it's to help reduce accidents over 3-6 weeks, not instantly- explained to pt/wife- can also order dig stim stick and suppository inserter as well- explained they are on [place] if wife wants to get them. Con't bowel program- had bowel accident last night- no BM with program.  2/18- had BM with bowel program last night- starting to work better- con't regimen  11.  Hypoalbuminemia  Supplement initiated on 2/5 12.   Transaminitis  Resolved 13.  Leukocytosis  WBC 12.1 on 2/5, labs ordered for tomorrow  2/7- WBC 17.7!- will check U/A and Cx again AND CXR since WBC up so much- not on steroids. Afebrile and says he doesn't feel ill.   2/8- has UTI- denied Sx's of UTI, however has incontinence- Keflex 500 mg TID- will wait for Cx results.   2/9- Cx shows ECOLI- PAN-SENSITIVE- CON'T Keflex  2/11- Keflex x total of 7 days-   2/15- WBC 8.2- resolved 14. Dispo  2/17- family conference today- spent 30 minutes in discussions about medical issues- as detailed about- explained to pt I know he gets scared when confronting something new- explained in stead of hearing I can't- lets try "i'm going to try".      LOS: 16 days A FACE TO FACE EVALUATION WAS PERFORMED  Randall Mccormick P Searra Carnathan 08/13/2020, 7:02 AM

## 2020-08-14 DIAGNOSIS — M5104 Intervertebral disc disorders with myelopathy, thoracic region: Secondary | ICD-10-CM | POA: Diagnosis not present

## 2020-08-14 LAB — CBC WITH DIFFERENTIAL/PLATELET
Abs Immature Granulocytes: 0.04 10*3/uL (ref 0.00–0.07)
Basophils Absolute: 0 10*3/uL (ref 0.0–0.1)
Basophils Relative: 0 %
Eosinophils Absolute: 0.4 10*3/uL (ref 0.0–0.5)
Eosinophils Relative: 5 %
HCT: 37.4 % — ABNORMAL LOW (ref 39.0–52.0)
Hemoglobin: 11.5 g/dL — ABNORMAL LOW (ref 13.0–17.0)
Immature Granulocytes: 1 %
Lymphocytes Relative: 25 %
Lymphs Abs: 1.9 10*3/uL (ref 0.7–4.0)
MCH: 28.6 pg (ref 26.0–34.0)
MCHC: 30.7 g/dL (ref 30.0–36.0)
MCV: 93 fL (ref 80.0–100.0)
Monocytes Absolute: 0.9 10*3/uL (ref 0.1–1.0)
Monocytes Relative: 11 %
Neutro Abs: 4.6 10*3/uL (ref 1.7–7.7)
Neutrophils Relative %: 58 %
Platelets: 285 10*3/uL (ref 150–400)
RBC: 4.02 MIL/uL — ABNORMAL LOW (ref 4.22–5.81)
RDW: 14.4 % (ref 11.5–15.5)
WBC: 7.8 10*3/uL (ref 4.0–10.5)
nRBC: 0 % (ref 0.0–0.2)

## 2020-08-14 LAB — COMPREHENSIVE METABOLIC PANEL
ALT: 21 U/L (ref 0–44)
AST: 16 U/L (ref 15–41)
Albumin: 2.9 g/dL — ABNORMAL LOW (ref 3.5–5.0)
Alkaline Phosphatase: 108 U/L (ref 38–126)
Anion gap: 6 (ref 5–15)
BUN: 12 mg/dL (ref 8–23)
CO2: 31 mmol/L (ref 22–32)
Calcium: 8.9 mg/dL (ref 8.9–10.3)
Chloride: 101 mmol/L (ref 98–111)
Creatinine, Ser: 0.86 mg/dL (ref 0.61–1.24)
GFR, Estimated: >60 mL/min (ref >60)
Glucose, Bld: 101 mg/dL — ABNORMAL HIGH (ref 70–99)
Potassium: 3.9 mmol/L (ref 3.5–5.1)
Sodium: 138 mmol/L (ref 135–145)
Total Bilirubin: 0.8 mg/dL (ref 0.3–1.2)
Total Protein: 5.7 g/dL — ABNORMAL LOW (ref 6.5–8.1)

## 2020-08-14 NOTE — Progress Notes (Signed)
Occupational Therapy Session Note  Patient Details  Name: Randall Mccormick MRN: [id number] Date of Birth: Jan 21, 1956  Today's Date: 08/14/2020 OT Individual Time: 1100-1200 OT Individual Time Calculation (min): 60 min    Short Term Goals: Week 3:  OT Short Term Goal 2 (Week 3): Pt will complete BSC transfer with LRAD and 2 assist OT Short Term Goal 3 (Week 3): Pt will complete LB dressing with mod A using AE PRN OT Short Term Goal 4 (Week 3): Pt will complete toileting tasks with max A  Skilled Therapeutic Interventions/Progress Updates:    Pt resting in w/c upon arrival. Pt deferred shower until tomorrow. OT intervention with focus on w/c propulsion, standing tolerance in [place], BUE strengthening, activity tolerance, discharge planning, and safety awareness. Pt propelled w/c gym and engaged in standing task in [place] (removing and placing cards on card board X 2 for total of 6 mins). Pt used 4# bar to tap ball to therapist-3x20 with rest breaks. Pt propelled to ortho gym and completed [employer] arm bike exercise-2x7 mins level 5 in opposite directions. Pt returned to room and requested his brief be changed. Pt incontinent of bowel. Pt stood in [place] X 3 for removal, hygiene and placement of new brief. Pt dependent for all tasks while steanding. Pt returned to w/c and remained in w/c with all needs within reach.   Therapy Documentation Precautions:  Precautions Precautions: Back,Fall,Other (comment) Precaution Comments: paraparesis Required Braces or Orthoses: Spinal Brace Spinal Brace: Other (comment) Spinal Brace Comments: Figure 8 Brace Restrictions Weight Bearing Restrictions: No  Pain:  Pt c/o 3/10 low back pain     Therapy/Group: Individual Therapy  Leroy Libman 08/14/2020, 12:15 PM

## 2020-08-14 NOTE — Progress Notes (Signed)
Physical Therapy Session Note  Patient Details  Name: Randall Mccormick MRN: [id number] Date of Birth: Oct 26, 1955  Today's Date: 08/14/2020 PT Individual Time: 0900-1000 14:45-1558 PT Individual Time Calculation (min): 60 min, 73 min  Short Term Goals: Week 2:  PT Short Term Goal 1 (Week 2): Pt will perform least restrictive transfer with max A PT Short Term Goal 2 (Week 2): Pt will tolerate standing x 5 min with LRAD PT Short Term Goal 3 (Week 2): Pt will propel w/c x 150 ft with min A PT Short Term Goal 4 (Week 2): Pt will maintain dynamic sitting balance x 5 min with min A  Skilled Therapeutic Interventions/Progress Updates:    Session 1: Pt semi reclined in bed on arrival and agreeable to therapy. Pt sat up to EOB with supervision and use of bed features. Max A for donning shoes. Stedy transfer to [facility] EOB>wc and wc> mat table. Pt propelled wc with BUE x 100 ft with supervision and VCs for propulsion technique. Once seated on table pt performed lateral leans to don maxi sky harness. Pt performed STS from elevated mat table and wc with eva walker, maxi sky, and CGA. Gait with maxi sky, eva walker, and CGA x 6 ft, x8 ft, x 11 ft, x9 ft. VCs to stand upright and improve heel strike. Pt demonstrated poor DF, knee buckling with fatigue, knee hyperextension. Standing with eva walker and maxi sky x 4 min to increase standing tolerance. Pt propelled wc with BUE x 100 ft with supervision. Pt left with all needs in reach and chair alarm active.  Session 2: Pt seated in wc with wife present on arrival. Pt donned BIL AFOs with tot A. Propelled wc x 100 ft with BUE and supervision. Stedy transfer wc>mat table with CGA. Gait with maxi sky and eva walker with CGA and AFOs 3 x 12 ft. Pt demonstrated improved gait mechanics and increased velocity with BIL AFOs in place with mirror for visual feedback. Pt performed LAQ while seated in wc with 1lb ankle weight on RLE and .5 lb on LLE, 3 x 10. Volley ball with 5 lb  weight bar x 11 min with breaks to improve UE strength and endurance. Pt returned to bed via CGA stedy transfer and was left with all needs in reach and bed alarm active.  Therapy Documentation Precautions:  Precautions Precautions: Back,Fall,Other (comment) Precaution Comments: paraparesis Required Braces or Orthoses: Spinal Brace Spinal Brace: Other (comment) Spinal Brace Comments: Figure 8 Brace Restrictions Weight Bearing Restrictions: No    Therapy/Group: Individual Therapy  Sharen Counter, SPT 08/14/2020, 12:50 PM

## 2020-08-14 NOTE — Progress Notes (Signed)
[place] PHYSICAL MEDICINE & REHABILITATION PROGRESS NOTE  Subjective/Complaints:  Pt reports doing bowel program nightly- going ok/better- but had 2 BMs yesterday that were accidents.  Had a great weekend otherwise.   ROS:    Pt denies SOB, abd pain, CP, N/V/C/D, and vision changes    Objective: Vital Signs: Blood pressure (!) 150/87, pulse 88, temperature 98.2 F (36.8 C), temperature source Oral, resp. rate 16, height 5\' 8"  (1.727 m), weight 103.6 kg, SpO2 94 %. No results found. Recent Labs    08/14/20 0618  WBC 7.8  HGB 11.5*  HCT 37.4*  PLT 285   Recent Labs    08/14/20 0618  NA 138  K 3.9  CL 101  CO2 31  GLUCOSE 101*  BUN 12  CREATININE 0.86  CALCIUM 8.9    Intake/Output Summary (Last 24 hours) at 08/14/2020 0837 Last data filed at 08/14/2020 0423 Gross per 24 hour  Intake 177 ml  Output 2025 ml  Net -1848 ml        Physical Exam: BP (!) 150/87 (BP Location: Left Arm)   Pulse 88   Temp 98.2 F (36.8 C) (Oral)   Resp 16   Ht 5\' 8"  (1.727 m)   Wt 103.6 kg   SpO2 94%   BMI 34.73 kg/m  Gen: sitting up eating breakfast in bed; appropriate, NAD HEENT: oral mucosa pink and moist, NCAT Cardio: RRR- no JVD Chest: CTA B/L- no W/R/R- good air movement Abd: Soft, NT, ND, (+)BS  Ext: no edema Psych: appropriate-  Skin: intact Motor: Bilateral upper extremities: 5/5  Right lower extremity: Hip flexion, knee extension 2+/5, ankle dorsiflexion 4-/5, stable Left lower extremity: Hip flexion, knee extension 2/5, ankle dorsiflexion 4 -/5, stable Sensation diminished sl to light touch bilateral lower extremities    Assessment/Plan: 1. Functional deficits which require 3+ hours per day of interdisciplinary therapy in a comprehensive inpatient rehab setting.  Physiatrist is providing close team supervision and 24 hour management of active medical problems listed below.  Physiatrist and rehab team continue to assess barriers to discharge/monitor  patient progress toward functional and medical goals   Care Tool:  Bathing    Body parts bathed by patient: Right arm,Left arm,Chest,Abdomen,Front perineal area,Right upper leg,Left upper leg,Face   Body parts bathed by helper: Buttocks,Right lower leg,Left lower leg     Bathing assist Assist Level: Minimal Assistance - Patient > 75%     Upper Body Dressing/Undressing Upper body dressing   What is the patient wearing?: Pull over shirt    Upper body assist Assist Level: Set up assist    Lower Body Dressing/Undressing Lower body dressing      What is the patient wearing?: Incontinence brief,Pants     Lower body assist Assist for lower body dressing: Total Assistance - Patient < 25%     Toileting Toileting    Toileting assist Assist for toileting: Dependent - Patient 0% Assistive Device Comment: pericare after urinal   Transfers Chair/bed transfer  Transfers assist     Chair/bed transfer assist level: Dependent - mechanical lift (stedy)     Locomotion Ambulation   Ambulation assist   Ambulation activity did not occur: Safety/medical concerns  Assist level: 2 helpers (min A of 1 and +2 w/c follow) Assistive device: Parallel bars Max distance: 79ft   Walk 10 feet activity   Assist  Walk 10 feet activity did not occur: Safety/medical concerns        Walk 50 feet activity  Assist Walk 50 feet with 2 turns activity did not occur: Safety/medical concerns         Walk 150 feet activity   Assist Walk 150 feet activity did not occur: Safety/medical concerns         Walk 10 feet on uneven surface  activity   Assist Walk 10 feet on uneven surfaces activity did not occur: Safety/medical concerns         Wheelchair     Assist Will patient use wheelchair at discharge?: Yes Type of Wheelchair: Manual    Wheelchair assist level: Supervision/Verbal cueing Max wheelchair distance: 144ft    Wheelchair 50 feet with 2 turns  activity    Assist        Assist Level: Supervision/Verbal cueing   Wheelchair 150 feet activity     Assist      Assist Level: Supervision/Verbal cueing    Medical Problem List and Plan: 1.  Bilateral lower extremity weakness with sensory deficits, decreased endurance, poor awareness/lacks insight as well as pain affecting ADLs and mobility secondary to thoracic myelopathy with paraparesis.  Continue CIR PT, OT 2.  Antithrombotics: -DVT/anticoagulation:  Pharmaceutical: Lovenox    Lower extremity Dopplers ordered  Dopplers (-)- will continue Lovenox             -antiplatelet therapy: N/a 3. Pain Management: Continue oxycodone prn.              --neuropathy BLE and right chest wall-->continue gabapentin 600 mg tid for now.   2/7- will Add Duloxetine 30 mg QHS and decrease Prozac to 20 mg daily since needs help with nerve pain and already at 600 mg TID of gabapentin  2/16- will increase Oxycodone to 15 mg q4 hours prn   2/17- pain doing better- today- explained might have difficulty giving this much pain meds when leaves, due to  [employer] for first 7 days- will see what we can do.   2/18- pain doing well/better with increase of Oxycodone- con't regimen 4. H/o anxiety/Mood: LCSW to follow for evaluation and support.              Prozaac  2/7- decrease to 20 mg daily so has Duloxetine  2/11- no change in affect  2/18- since on prozac, cannot increase Duloxetine  2/21- mood better last few days- con't regimen             -antipsychotic agents: Continue Zyprexa 5. Neuropsych: This patient is capable of making decisions on his own behalf. 6. Skin/Wound Care: Routine pressure relief measures. Hypoallergenic sheets.   Prevalon boots for both heels ordered 7. Fluids/Electrolytes/Nutrition: Monitor I/Os.    8. Neurogenic bladder:   UA unremarkable, urine culture pending  2/7- will recheck U/A and Cx due to incontinence AND leukocytosis  2/8- F/U U/A is strongly (+) for UTI-  started Keflex 500 mg TID- awaiting Cx- will recheck CBC in AM- wait on stopping Flomax.  2/9- U Cx (+) for Ecoli- pansensitive- WBC down to 14k- will con't Keflex 500 mg TID for a total of 7 days.    2/10- labs in AM- CBC-diff and BMP to make sure things normalizing.  2/11- CBC shows WBC down to 11k- con't Keflex for 7 days total   PVRs  2/13 improving voids (PVR 150-200-438), continent   -continue Flomax, OOB to void  2/14- increased Flomax to 0.8 mg qevening- since required cath x1-  2/15- WBC down to 8.2- con't regimen   2/16- hasn't required cath in last  48 hours  2/17- no caths required since Monday- flomax helping, but "gotta go"- immediately. - helped with urinal x1. con't regimen 9. HTN: Monitor BP   Norvasc  Mildly elevated on 2/6, monitor for trend  2/16- BP 122/77- con't regimen  2/20- BP 136/86- contine regimen  2/21- BP 150/87- will monitor- usually stable- con't regimen             Monitor with increased mobility 10. Neurogenic bowel:    KUB reviewed, unremarkable             Miralax daily and dulcolax supp w/dig stim after supper.   2/9- refused all bowel program x5+ days- explained to pt why he needs it- should be able to avoid "2 days of stools", but the more he waits, the more chance he will have  A 1-2 day "blowout", which will be due to not emptying gut. He said he would try it.  2/10- refused bowel program again. Incontinent- doesn't understand can't control bowels.  2/13- pt continues to refuse bowel program. Had a frank conversation as to why we're doing it. Pt seemed to listen/understand. We'll see how it goes tonight  2/17- discussed bowel program and how it's to help reduce accidents over 3-6 weeks, not instantly- explained to pt/wife- can also order dig stim stick and suppository inserter as well- explained they are on [place] if wife wants to get them. Con't bowel program- had bowel accident last night- no BM with program.  2/21- Tolerating bowel program nightly-  still having accidents, it's in the first week, so expected.  11.  Hypoalbuminemia  Supplement initiated on 2/5 12.  Transaminitis  Resolved 13.  Leukocytosis  WBC 12.1 on 2/5, labs ordered for tomorrow  2/7- WBC 17.7!- will check U/A and Cx again AND CXR since WBC up so much- not on steroids. Afebrile and says he doesn't feel ill.   2/8- has UTI- denied Sx's of UTI, however has incontinence- Keflex 500 mg TID- will wait for Cx results.   2/9- Cx shows ECOLI- PAN-SENSITIVE- CON'T Keflex  2/11- Keflex x total of 7 days-   2/15- WBC 8.2- resolved 14. Dispo  2/17- family conference today- spent 30 minutes in discussions about medical issues- as detailed about- explained to pt I know he gets scared when confronting something new- explained in stead of hearing I can't- lets try "i'm going to try".      LOS: 17 days A FACE TO FACE EVALUATION WAS PERFORMED  Mairany Bruno 08/14/2020, 8:37 AM

## 2020-08-15 DIAGNOSIS — M5104 Intervertebral disc disorders with myelopathy, thoracic region: Secondary | ICD-10-CM | POA: Diagnosis not present

## 2020-08-15 NOTE — Patient Care Conference (Signed)
Inpatient RehabilitationTeam Conference and Plan of Care Update Date: 08/15/2020   Time: 11:17 AM    Patient Name: Randall Mccormick      Medical Record Number: [id number]  Date of Birth: Nov 15, 1955 Sex: Male         Room/Bed: 4M04C/4M04C-01 Payor Info: Payor: MEDICARE RAILROAD / Plan: MEDICARE RAILROAD / Product Type: *No Product type* /    Admit Date/Time:  07/28/2020  2:39 PM  Primary Diagnosis:  Thoracic disc disease with myelopathy  Hospital Problems: Principal Problem:   Thoracic disc disease with myelopathy Active Problems:   Constipation   Neurogenic bowel   Neurogenic bladder   Neuropathic pain   Chronic pain syndrome   Leukocytosis   Transaminitis   Hypoalbuminemia due to protein-calorie malnutrition [facility])    Expected Discharge Date: Expected Discharge Date: 09/01/20  Team Members Present: Physician leading conference: Dr. Courtney Heys Care Coodinator Present: Loralee Pacas, LCSWA;Stacey Creig Hines, RN, BSN, CRRN Nurse Present: Rayne Du, LPN PT Present: Excell Seltzer, PT OT Present: Roanna Epley, [place], OT PPS Coordinator present : Gunnar Fusi, SLP     Current Status/Progress Goal Weekly Team Focus  Bowel/Bladder   pt continent of b/b; LBM: 02/20, afternnon bowel program  will remain continent of b/b  assist with toileting needs prn   Swallow/Nutrition/ Hydration             ADL's   bathing-min A at shower level; UB dressing-supervision; LB dressing with sit<>stand in [place]-max A; transfers with [place]  min/mod A overall  bed mobility, activity tolerance, BADL training, functional transfers   Mobility   supervision supine>sit, mod A sit>supine. Transfer with stedy, supervision wc mobility x 100 ft. Gait with eva and maxi sky x 12 ft.  min A overall, likely w/c level (will need a ramp)  Gait, progressing to walker if possible, strength, wc mobility   Communication             Safety/Cognition/ Behavioral Observations            Pain    c/o pain to back; prn oxycodone 15mg   pain level <6  assess pain level QS and prn   Skin   ecchymosis bil UE, abrasion L hand, surgical site OTA on back  Skin will remain infection free without breakdown  assess skin QS and prn     Discharge Planning:  D/c to home with 24/7 care from pt wife. Pt will need to work on being able to perform self-care needs and becoming more self-sufficient. Family meeting held on 2/17 addressing pt care needs, and pt discharging at w/c level.   Team Discussion: Please document result's of bowel program, MD would like patient to stop with "I can't" and start with the "I'm going to try." Increased Oxy IR dosage. Nursing reports patient is not doing the bowel program. He does well with the urinal. Has pain and nursing is rotating Tylenol, Robaxin, and Oxy IR.  Patient on target to meet rehab goals: Doing well with therapy, walked with the [place]. Using [place] for transfers. Participating in OT. At shower level he is a min assist. Receptive to using adaptive equipment. Working towards squat pivot transfers.  *See Care Plan and progress notes for long and short-term goals.   Revisions to Treatment Plan:  MD increased Oxy IR dosage.  Teaching Needs: Family education, medication management, bowel program education, skin/wound care, transfer training, gait training, pain management, endurance training.  Current Barriers to Discharge: Inaccessible home environment, Decreased caregiver support, Home  enviroment access/layout, Incontinence, Neurogenic bowel and bladder, Wound care, Lack of/limited family support, Weight, Weight bearing restrictions, Medication compliance and Behavior  Possible Resolutions to Barriers: Continue current medications, bowel program education, provide emotional support to patient and family.     Medical Summary Current Status: Pain better with increased oxy  ; not doing bowel program since 2/19- refused it last few days- says "doesn't need  it". refused x 4 days; better with urinal;  Barriers to Discharge: Incontinence;Neurogenic Bowel & Bladder;Behavior;Medication compliance;Medical stability;Decreased family/caregiver support;Home enviroment access/layout;Weight;Weight bearing restrictions  Barriers to Discharge Comments: only wife to care for him at home- not willing to do bowel program- pt expects wife to do all his care Possible Resolutions to [employer] Focus: Pain improved with increased pain meds; needs bowel program- keeps refusing; - focus on bowels/ and participation better- - wants to keep longer/extend him- since participating better with PT/OT-extend to 3/11   Continued Need for Acute Rehabilitation Level of Care: The patient requires daily medical management by a physician with specialized training in physical medicine and rehabilitation for the following reasons: Direction of a multidisciplinary physical rehabilitation program to maximize functional independence : Yes Medical management of patient stability for increased activity during participation in an intensive rehabilitation regime.: Yes Analysis of laboratory values and/or radiology reports with any subsequent need for medication adjustment and/or medical intervention. : Yes   I attest that I was present, lead the team conference, and concur with the assessment and plan of the team.   Cristi Loron 08/15/2020, 6:11 PM

## 2020-08-15 NOTE — Progress Notes (Signed)
Occupational Therapy Session Note  Patient Details  Name: Randall Mccormick MRN: [id number] Date of Birth: [birth year]  Today's Date: 08/15/2020 OT Individual Time: 0900-1000 OT Individual Time Calculation (min): 60 min    Short Term Goals: Week 3:  OT Short Term Goal 2 (Week 3): Pt will complete BSC transfer with LRAD and 2 assist OT Short Term Goal 3 (Week 3): Pt will complete LB dressing with mod A using AE PRN OT Short Term Goal 4 (Week 3): Pt will complete toileting tasks with max A  Skilled Therapeutic Interventions/Progress Updates:    OT intervention with focus on bed mobility, sit<>stand in [place], bathing at shower level, dressing with sit<>stand in [place], AE use, activity tolerance, and safety awareness to increase independence with BADLs. Transfers with Stedy. Sit<>stand with min A. Standing in [place] with forward lean and heavy reliance on BUE. Bathing with min A at shower level. Pt used reacher to thread BLE into pants. Dependent for pulling over hips when standing. Pt used sock aid for donning one sock but not efficient. Pt wears tube socks that come up to his knees and unable to removed sock aid from sock. Pt progressing with sit<>stand and standing. Continue to reinforce use of AE to increase independence. Pt remained in w/c awaiting PT session.    Therapy Documentation Precautions:  Precautions Precautions: Back,Fall,Other (comment) Precaution Comments: paraparesis Required Braces or Orthoses: Spinal Brace Spinal Brace: Other (comment) Spinal Brace Comments: Figure 8 Brace Restrictions Weight Bearing Restrictions: No   Pain: Pain Assessment Pain Scale: 0-10 Pain Score: 0-No pain Therapy/Group: Individual Therapy  Leroy Libman 08/15/2020, 10:04 AM

## 2020-08-15 NOTE — Progress Notes (Signed)
Occupational Therapy Session Note  Patient Details  Name: Randall Mccormick MRN: [id number] Date of Birth: [birth year]  Today's Date: 08/15/2020 OT Individual Time: 1400-1440 OT Individual Time Calculation (min): 40 min    Short Term Goals: Week 3:  OT Short Term Goal 2 (Week 3): Pt will complete BSC transfer with LRAD and 2 assist OT Short Term Goal 3 (Week 3): Pt will complete LB dressing with mod A using AE PRN OT Short Term Goal 4 (Week 3): Pt will complete toileting tasks with max A  Skilled Therapeutic Interventions/Progress Updates:    Pt sitting up in w/c, agreeable to OT, reports no significant pain at this time.  Pt propelled using BUE with supervision to ortho gym, however upon arriving, pt reporting need to urinate urgently.  OT transported pt back to room for time mgt and promote continence.  Pt required max assist to place urinal as pt required bilateral hands for clothing mgt.  Pt had continent episode of urine.  Pt propelled to ortho gym with supervision using BUE.  Pt completed Sci[employer] arm bike exercise-x 10 mins to promote increased activity tolerance and endurance.  Pt transported back to room and requested back to bed at end of session.  Sit<>stand at stedy with CGA and transported pt w/c<>EOB.  Mod assist sit to supine to support BLE.  Call bell in reach, bed alarm on.  Therapy Documentation Precautions:  Precautions Precautions: Back,Fall,Other (comment) Precaution Comments: paraparesis Required Braces or Orthoses: Spinal Brace Spinal Brace: Other (comment) Spinal Brace Comments: Figure 8 Brace Restrictions Weight Bearing Restrictions: No   Therapy/Group: Individual Therapy  Ezekiel Slocumb 08/15/2020, 3:35 PM

## 2020-08-15 NOTE — Progress Notes (Signed)
Physical Therapy Weekly Progress Note  Patient Details  Name: Randall Mccormick MRN: [id number] Date of Birth: 09-08-1955  Beginning of progress report period: August 08, 2020 End of progress report period: August 15, 2020  Today's Date: 08/15/2020 PT Individual Time:1000-1100, 1130-1200 PT Individual Time Calculation (min): 60, 30 min  Patient has met 3 of 4 short term goals. Supine>EOB with supervision and use of bed features, but requires mod A to return to supine for LE management. Pt continues to progress with sit to stand with stedy, min A to CGA at times. Pt was able to walk 3 x 12 ft with eva walker and maxi sky. Trialed STS with RW from elevated mat surface, with mod A to block knees and power up to stand. Wc mobility x 150 ft with supervision, demonstrating improving endurance and navigation. Slide board transfer with mod A wc<>mat.   Patient continues to demonstrate the following deficits muscle weakness, decreased cardiorespiratoy endurance, unbalanced muscle activation and decreased coordination and decreased sitting balance and therefore will continue to benefit from skilled PT intervention to increase functional independence with mobility.  Patient progressing toward long term goals..  Continue plan of care.  PT Short Term Goals Week 2:  PT Short Term Goal 1 (Week 2): Pt will perform least restrictive transfer with max A PT Short Term Goal 1 - Progress (Week 2): Met PT Short Term Goal 2 (Week 2): Pt will tolerate standing x 5 min with LRAD PT Short Term Goal 2 - Progress (Week 2): Progressing toward goal PT Short Term Goal 3 (Week 2): Pt will propel w/c x 150 ft with min A PT Short Term Goal 3 - Progress (Week 2): Met PT Short Term Goal 4 (Week 2): Pt will maintain dynamic sitting balance x 5 min with min A PT Short Term Goal 4 - Progress (Week 2): Met Week 3:  PT Short Term Goal 1 (Week 3): Pt will perform least restrictive transfer with mod A consistently. PT Short Term  Goal 2 (Week 3): Pt will tolerate standing x 5 min with LRAD. PT Short Term Goal 3 (Week 3): Pt will propel wc x 300 ft with supervision.  Skilled Therapeutic Interventions/Progress Updates:  Ambulation/gait training;Balance/vestibular training;Community reintegration;Discharge planning;DME/adaptive equipment instruction;Neuromuscular re-education;Functional electrical stimulation;Pain management;Functional mobility training;Patient/family education;Psychosocial support;Therapeutic Activities;Therapeutic Exercise;UE/LE Strength taining/ROM;UE/LE Coordination activities;Wheelchair propulsion/positioning;Stair training;Splinting/orthotics   Session 1: Pt seated in wc on arrival and agreeable to therapy. Pt propelled wc with BUE 3 x 150 ft with supervision, demonstrating improved speed and navigation from previous sessions. Stedy transfer wc>mat table with min A to STS. Pt performed STS from elevated mat to RW with mod A x5 and x3, with one episode of knee buckling. Mirror placed in front of pt for visual feedback to stand with upright posture and initiate hip extension. Pt kicked 6 lb med ball x 3 min to improve strength and power in BLE. Kinetron 4 x 1 min at 60 cm/sec to improve posterior chain activation, pt reported RPE 13/20. Pt returned to room and was left with all needs in reach.   Session 2: Pt seated in wc, agreeable to therapy. Pt propelled wc with BUE and supervision to gym, x 150 ft. Performed wc pushups 3 x 10 with VCs to lean forward to load LE. Pt performed slideboard transfer with mod A wc<>mat table. Pt had difficulty utilizing head hips relationship to scoot efficiently, even after multimodal cueing. Pt propelled wc back to room, x 150 ft with BUE and supervision. Pt  left with all needs in reach, seated in wc.   Therapy Documentation Precautions:  Precautions Precautions: Back,Fall,Other (comment) Precaution Booklet Issued: No Precaution Comments: paraparesis Required Braces or  Orthoses: Spinal Brace Spinal Brace: Other (comment) Spinal Brace Comments: Figure 8 Brace Restrictions Weight Bearing Restrictions: No   Therapy/Group: Individual Therapy  Sharen Counter, SPT 08/15/2020, 5:15 PM

## 2020-08-15 NOTE — Progress Notes (Signed)
Patient accepted suppository this evening. Patient refused dig stim due to discharge situation. Patient stated it was unrealistic to think his wife would do dig stim on him every night and said that wasn't really something he would be interested in doing nor is something he would even feel comfortable asking his wife to do. Patient stated he has been going consistently every other day without use of suppository and doesn't see what the problem would be at home. Patient states wife would for sure insert suppository no problem, but dig stim was out of the question. Nurse educated patient on purpose of bowel program and the importance of consistency.

## 2020-08-15 NOTE — Progress Notes (Signed)
[place] PHYSICAL MEDICINE & REHABILITATION PROGRESS NOTE  Subjective/Complaints:  Getting shower today.  No BM with bowel program, but had 2 BMs/accidents yesterday.     ROS:   Pt denies SOB, abd pain, CP, N/V/C/D, and vision changes    Objective: Vital Signs: Blood pressure 136/75, pulse 86, temperature 98.4 F (36.9 C), temperature source Oral, resp. rate 18, height 5\' 8"  (1.727 m), weight 103.6 kg, SpO2 93 %. No results found. Recent Labs    08/14/20 0618  WBC 7.8  HGB 11.5*  HCT 37.4*  PLT 285   Recent Labs    08/14/20 0618  NA 138  K 3.9  CL 101  CO2 31  GLUCOSE 101*  BUN 12  CREATININE 0.86  CALCIUM 8.9    Intake/Output Summary (Last 24 hours) at 08/15/2020 0835 Last data filed at 08/15/2020 0746 Gross per 24 hour  Intake 780 ml  Output 1500 ml  Net -720 ml        Physical Exam: BP 136/75 (BP Location: Right Arm)   Pulse 86   Temp 98.4 F (36.9 C) (Oral)   Resp 18   Ht 5\' 8"  (1.727 m)   Wt 103.6 kg   SpO2 93%   BMI 34.73 kg/m  Gen: awake, alert, sitting up in bed; NAD HEENT: oral mucosa pink and moist, NCAT Cardio: RRR_ no JVD Chest: CTA B/L- no W/R/R- good air movement Abd: Soft, NT, ND, (+)BS  Ext: no edema Psych: appropriate- no change- brighter, more positive affect Skin: intact Motor: Bilateral upper extremities: 5/5  Right lower extremity: Hip flexion, knee extension 2+/5, ankle dorsiflexion 4-/5, stable Left lower extremity: Hip flexion, knee extension 2/5, ankle dorsiflexion 4 -/5, stable Sensation diminished sl to light touch bilateral lower extremities    Assessment/Plan: 1. Functional deficits which require 3+ hours per day of interdisciplinary therapy in a comprehensive inpatient rehab setting.  Physiatrist is providing close team supervision and 24 hour management of active medical problems listed below.  Physiatrist and rehab team continue to assess barriers to discharge/monitor patient progress toward functional  and medical goals   Care Tool:  Bathing    Body parts bathed by patient: Right arm,Left arm,Chest,Abdomen,Front perineal area,Right upper leg,Left upper leg,Face   Body parts bathed by helper: Buttocks,Right lower leg,Left lower leg     Bathing assist Assist Level: Minimal Assistance - Patient > 75%     Upper Body Dressing/Undressing Upper body dressing   What is the patient wearing?: Pull over shirt    Upper body assist Assist Level: Set up assist    Lower Body Dressing/Undressing Lower body dressing      What is the patient wearing?: Incontinence brief,Pants     Lower body assist Assist for lower body dressing: Total Assistance - Patient < 25%     Toileting Toileting    Toileting assist Assist for toileting: Dependent - Patient 0% Assistive Device Comment: pericare after urinal   Transfers Chair/bed transfer  Transfers assist     Chair/bed transfer assist level: Dependent - mechanical lift     Locomotion Ambulation   Ambulation assist   Ambulation activity did not occur: Safety/medical concerns  Assist level: 2 helpers (eva walker and wc follow) Assistive device: Maxi Sky Max distance: 11   Walk 10 feet activity   Assist  Walk 10 feet activity did not occur: Safety/medical concerns  Assist level: Maximal Assistance - Patient 25 - 49% Assistive device: Maxi Sky   Walk 50 feet activity  Assist Walk 50 feet with 2 turns activity did not occur: Safety/medical concerns         Walk 150 feet activity   Assist Walk 150 feet activity did not occur: Safety/medical concerns         Walk 10 feet on uneven surface  activity   Assist Walk 10 feet on uneven surfaces activity did not occur: Safety/medical concerns         Wheelchair     Assist Will patient use wheelchair at discharge?: Yes Type of Wheelchair: Manual    Wheelchair assist level: Supervision/Verbal cueing Max wheelchair distance: 128ft    Wheelchair 50 feet  with 2 turns activity    Assist        Assist Level: Supervision/Verbal cueing   Wheelchair 150 feet activity     Assist      Assist Level: Supervision/Verbal cueing    Medical Problem List and Plan: 1.  Bilateral lower extremity weakness with sensory deficits, decreased endurance, poor awareness/lacks insight as well as pain affecting ADLs and mobility secondary to thoracic myelopathy with paraparesis.  Continue CIR PT, OT 2.  Antithrombotics: -DVT/anticoagulation:  Pharmaceutical: Lovenox    Lower extremity Dopplers ordered  Dopplers (-)- will continue Lovenox             -antiplatelet therapy: N/a 3. Pain Management: Continue oxycodone prn.              --neuropathy BLE and right chest wall-->continue gabapentin 600 mg tid for now.   2/7- will Add Duloxetine 30 mg QHS and decrease Prozac to 20 mg daily since needs help with nerve pain and already at 600 mg TID of gabapentin  2/16- will increase Oxycodone to 15 mg q4 hours prn   2/17- pain doing better- today- explained might have difficulty giving this much pain meds when leaves, due to  [employer] for first 7 days- will see what we can do.   2/18- pain doing well/better with increase of Oxycodone- con't regimen  2/22- pain controlled- con't regimen 4. H/o anxiety/Mood: LCSW to follow for evaluation and support.              Prozaac  2/7- decrease to 20 mg daily so has Duloxetine  2/11- no change in affect  2/18- since on prozac, cannot increase Duloxetine  2/21- mood better last few days- con't regimen             -antipsychotic agents: Continue Zyprexa 5. Neuropsych: This patient is capable of making decisions on his own behalf. 6. Skin/Wound Care: Routine pressure relief measures. Hypoallergenic sheets.   Prevalon boots for both heels ordered 7. Fluids/Electrolytes/Nutrition: Monitor I/Os.    8. Neurogenic bladder:   UA unremarkable, urine culture pending  2/7- will recheck U/A and Cx due to incontinence AND  leukocytosis  2/8- F/U U/A is strongly (+) for UTI- started Keflex 500 mg TID- awaiting Cx- will recheck CBC in AM- wait on stopping Flomax.  2/9- U Cx (+) for Ecoli- pansensitive- WBC down to 14k- will con't Keflex 500 mg TID for a total of 7 days.    2/10- labs in AM- CBC-diff and BMP to make sure things normalizing.  2/11- CBC shows WBC down to 11k- con't Keflex for 7 days total   PVRs  2/13 improving voids (PVR 150-200-438), continent   -continue Flomax, OOB to void  2/14- increased Flomax to 0.8 mg qevening- since required cath x1-  2/15- WBC down to 8.2- con't regimen  2/16- hasn't required cath in last 48 hours  2/17- no caths required since Monday- flomax helping, but "gotta go"- immediately. - helped with urinal x1. con't regimen 9. HTN: Monitor BP   Norvasc  Mildly elevated on 2/6, monitor for trend  2/16- BP 122/77- con't regimen  2/20- BP 136/86- contine regimen  2/21- BP 150/87- will monitor- usually stable- con't regimen  2/22- BP 130s/70s- con't regimen             Monitor with increased mobility 10. Neurogenic bowel:    KUB reviewed, unremarkable             Miralax daily and dulcolax supp w/dig stim after supper.   2/9- refused all bowel program x5+ days- explained to pt why he needs it- should be able to avoid "2 days of stools", but the more he waits, the more chance he will have  A 1-2 day "blowout", which will be due to not emptying gut. He said he would try it.  2/10- refused bowel program again. Incontinent- doesn't understand can't control bowels.  2/13- pt continues to refuse bowel program. Had a frank conversation as to why we're doing it. Pt seemed to listen/understand. We'll see how it goes tonight  2/17- discussed bowel program and how it's to help reduce accidents over 3-6 weeks, not instantly- explained to pt/wife- can also order dig stim stick and suppository inserter as well- explained they are on [place] if wife wants to get them. Con't bowel program- had  bowel accident last night- no BM with program.  2/21- Tolerating bowel program nightly- still having accidents, it's in the first week, so expected.  11.  Hypoalbuminemia  Supplement initiated on 2/5 12.  Transaminitis  Resolved 13.  Leukocytosis  WBC 12.1 on 2/5, labs ordered for tomorrow  2/7- WBC 17.7!- will check U/A and Cx again AND CXR since WBC up so much- not on steroids. Afebrile and says he doesn't feel ill.   2/8- has UTI- denied Sx's of UTI, however has incontinence- Keflex 500 mg TID- will wait for Cx results.   2/9- Cx shows ECOLI- PAN-SENSITIVE- CON'T Keflex  2/11- Keflex x total of 7 days-   2/15- WBC 8.2- resolved 14. Dispo  2/17- family conference today- spent 30 minutes in discussions about medical issues- as detailed about- explained to pt I know he gets scared when confronting something new- explained in stead of hearing I can't- lets try "i'm going to try".      LOS: 18 days A FACE TO FACE EVALUATION WAS PERFORMED  Launi Asencio 08/15/2020, 8:35 AM

## 2020-08-15 NOTE — Progress Notes (Addendum)
Patient ID: Randall Mccormick, male   DOB: [birth year], 65 y.o.   MRN: [id number]  SW met with pt in room to provide updates from team conference on gains made in rehab, and d/c date moved from 3/4 to 3/11. Pt would like resources on ramp, and would like the information to be given to his wife. SW will follow-up with his wife to give updates.   SW called pt wife [name] [phone]) to provide updates, and SW to leave ramp resources in the room.   SW left resources in room.   Loralee Pacas, MSW, [place] Office: [phone] Cell: [phone] Fax: [phone]

## 2020-08-16 DIAGNOSIS — M5104 Intervertebral disc disorders with myelopathy, thoracic region: Secondary | ICD-10-CM | POA: Diagnosis not present

## 2020-08-16 NOTE — Progress Notes (Signed)
[facility] PHYSICAL MEDICINE & REHABILITATION PROGRESS NOTE  Subjective/Complaints:  Refusing dig stim since "doesn't want to have wife do it". Willing to have suppository done- and did that last night - did have results.    Hasn't been retaining urine anymore.  Happy has extra week to improve- in terms of d/c date.      ROS:   Pt denies SOB, abd pain, CP, N/V/C/D, and vision changes    Objective: Vital Signs: Blood pressure (!) 118/58, pulse 88, temperature 98.5 F (36.9 C), resp. rate 18, height 5\' 8"  (1.727 m), weight 103.6 kg, SpO2 94 %. No results found. Recent Labs    08/14/20 0618  WBC 7.8  HGB 11.5*  HCT 37.4*  PLT 285   Recent Labs    08/14/20 0618  NA 138  K 3.9  CL 101  CO2 31  GLUCOSE 101*  BUN 12  CREATININE 0.86  CALCIUM 8.9    Intake/Output Summary (Last 24 hours) at 08/16/2020 0842 Last data filed at 08/16/2020 0800 Gross per 24 hour  Intake 240 ml  Output 627 ml  Net -387 ml        Physical Exam: BP (!) 118/58 (BP Location: Left Arm)   Pulse 88   Temp 98.5 F (36.9 C)   Resp 18   Ht 5\' 8"  (1.727 m)   Wt 103.6 kg   SpO2 94%   BMI 34.73 kg/m  Gen: awake alert, but needed help to wake up; appropriate, NAD HEENT: oral mucosa pink and moist, NCAT Cardio: RRR- no JVD Chest: CTA B/L- no W/R/R- good air movement Abd: Soft, NT, ND, (+)BS  Ext: no edema Psych: appropriate- more (+) affect Skin: intact Motor: Bilateral upper extremities: 5/5  Right lower extremity: Hip flexion, knee extension 2+/5, ankle dorsiflexion 4-/5, stable Left lower extremity: Hip flexion, knee extension 2/5, ankle dorsiflexion 4 -/5, stable Sensation diminished sl to light touch bilateral lower extremities    Assessment/Plan: 1. Functional deficits which require 3+ hours per day of interdisciplinary therapy in a comprehensive inpatient rehab setting.  Physiatrist is providing close team supervision and 24 hour management of active medical problems listed  below.  Physiatrist and rehab team continue to assess barriers to discharge/monitor patient progress toward functional and medical goals   Care Tool:  Bathing    Body parts bathed by patient: Right arm,Left arm,Chest,Abdomen,Front perineal area,Right upper leg,Left upper leg,Face   Body parts bathed by helper: Buttocks,Right lower leg,Left lower leg     Bathing assist Assist Level: Minimal Assistance - Patient > 75%     Upper Body Dressing/Undressing Upper body dressing   What is the patient wearing?: Pull over shirt    Upper body assist Assist Level: Set up assist    Lower Body Dressing/Undressing Lower body dressing      What is the patient wearing?: Incontinence brief,Pants     Lower body assist Assist for lower body dressing: Maximal Assistance - Patient 25 - 49%     Toileting Toileting    Toileting assist Assist for toileting: Dependent - Patient 0% Assistive Device Comment: pericare after urinal   Transfers Chair/bed transfer  Transfers assist     Chair/bed transfer assist level: Moderate Assistance - Patient 50 - 74% (slideboard)     Locomotion Ambulation   Ambulation assist   Ambulation activity did not occur: Safety/medical concerns  Assist level: 2 helpers (eva walker and wc follow) Assistive device: Maxi Sky Max distance: 11   Walk 10 feet activity  Assist  Walk 10 feet activity did not occur: Safety/medical concerns  Assist level: Maximal Assistance - Patient 25 - 49% Assistive device: Maxi Sky   Walk 50 feet activity   Assist Walk 50 feet with 2 turns activity did not occur: Safety/medical concerns         Walk 150 feet activity   Assist Walk 150 feet activity did not occur: Safety/medical concerns         Walk 10 feet on uneven surface  activity   Assist Walk 10 feet on uneven surfaces activity did not occur: Safety/medical concerns         Wheelchair     Assist Will patient use wheelchair at  discharge?: Yes Type of Wheelchair: Manual    Wheelchair assist level: Supervision/Verbal cueing Max wheelchair distance: 177ft    Wheelchair 50 feet with 2 turns activity    Assist        Assist Level: Supervision/Verbal cueing   Wheelchair 150 feet activity     Assist      Assist Level: Supervision/Verbal cueing    Medical Problem List and Plan: 1.  Bilateral lower extremity weakness with sensory deficits, decreased endurance, poor awareness/lacks insight as well as pain affecting ADLs and mobility secondary to thoracic myelopathy with paraparesis.  Continue CIR PT, OT 2.  Antithrombotics: -DVT/anticoagulation:  Pharmaceutical: Lovenox    Lower extremity Dopplers ordered  Dopplers (-)- will continue Lovenox             -antiplatelet therapy: N/a 3. Pain Management: Continue oxycodone prn.              --neuropathy BLE and right chest wall-->continue gabapentin 600 mg tid for now.   2/7- will Add Duloxetine 30 mg QHS and decrease Prozac to 20 mg daily since needs help with nerve pain and already at 600 mg TID of gabapentin  2/16- will increase Oxycodone to 15 mg q4 hours prn   2/17- pain doing better- today- explained might have difficulty giving this much pain meds when leaves, due to  Federal law for first 7 days- will see what we can do.   2/23- pain doing well- con't regimen 4. H/o anxiety/Mood: LCSW to follow for evaluation and support.              Prozaac  2/7- decrease to 20 mg daily so has Duloxetine  2/11- no change in affect  2/18- since on prozac, cannot increase Duloxetine  2/21- mood better last few days- con't regimen             -antipsychotic agents: Continue Zyprexa 5. Neuropsych: This patient is capable of making decisions on his own behalf. 6. Skin/Wound Care: Routine pressure relief measures. Hypoallergenic sheets.   Prevalon boots for both heels ordered 7. Fluids/Electrolytes/Nutrition: Monitor I/Os.    8. Neurogenic bladder:   UA  unremarkable, urine culture pending  2/7- will recheck U/A and Cx due to incontinence AND leukocytosis  2/8- F/U U/A is strongly (+) for UTI- started Keflex 500 mg TID- awaiting Cx- will recheck CBC in AM- wait on stopping Flomax.  2/9- U Cx (+) for Ecoli- pansensitive- WBC down to 14k- will con't Keflex 500 mg TID for a total of 7 days.    2/10- labs in AM- CBC-diff and BMP to make sure things normalizing.  2/11- CBC shows WBC down to 11k- con't Keflex for 7 days total   PVRs  2/13 improving voids (PVR 150-200-438), continent   -continue Flomax, OOB to  void  2/14- increased Flomax to 0.8 mg qevening- since required cath x1-  2/15- WBC down to 8.2- con't regimen   2/16- hasn't required cath in last 48 hours  2/17- no caths required since Monday- flomax helping, but "gotta go"- immediately. - helped with urinal x1. con't regimen  2/23- no caths required- no retention since increased Flomax - will stop PVRs and bladder scans 9. HTN: Monitor BP   Norvasc  Mildly elevated on 2/6, monitor for trend  2/23- BP controlled- con't regimen             Monitor with increased mobility 10. Neurogenic bowel:    KUB reviewed, unremarkable             Miralax daily and dulcolax supp w/dig stim after supper.   2/9- refused all bowel program x5+ days- explained to pt why he needs it- should be able to avoid "2 days of stools", but the more he waits, the more chance he will have  A 1-2 day "blowout", which will be due to not emptying gut. He said he would try it.  2/10- refused bowel program again. Incontinent- doesn't understand can't control bowels.  2/13- pt continues to refuse bowel program. Had a frank conversation as to why we're doing it. Pt seemed to listen/understand. We'll see how it goes tonight  2/17- discussed bowel program and how it's to help reduce accidents over 3-6 weeks, not instantly- explained to pt/wife- can also order dig stim stick and suppository inserter as well- explained they are on  [place] if wife wants to get them. Con't bowel program- had bowel accident last night- no BM with program.  2/21- Tolerating bowel program nightly- still having accidents, it's in the first week, so expected.  2/23- wasn't doing bowel program- restarted Suppository but refuses dig stim  11.  Hypoalbuminemia  Supplement initiated on 2/5 12.  Transaminitis  Resolved 13.  Leukocytosis  WBC 12.1 on 2/5, labs ordered for tomorrow  2/7- WBC 17.7!- will check U/A and Cx again AND CXR since WBC up so much- not on steroids. Afebrile and says he doesn't feel ill.   2/8- has UTI- denied Sx's of UTI, however has incontinence- Keflex 500 mg TID- will wait for Cx results.   2/9- Cx shows ECOLI- PAN-SENSITIVE- CON'T Keflex  2/11- Keflex x total of 7 days-   2/15- WBC 8.2- resolved 14. Dispo  2/17- family conference today- spent 30 minutes in discussions about medical issues- as detailed about- explained to pt I know he gets scared when confronting something new- explained in stead of hearing I can't- lets try "i'm going to try".      LOS: 19 days A FACE TO FACE EVALUATION WAS PERFORMED  Shauni Henner 08/16/2020, 8:42 AM

## 2020-08-16 NOTE — Progress Notes (Signed)
Physical Therapy Session Note  Patient Details  Name: Randall Mccormick MRN: [id number] Date of Birth: [birth year]  Today's Date: 08/16/2020 PT Individual Time: 1100-1200, 1600-1655 PT Individual Time Calculation (min): 60 min, 55 min  Short Term Goals: Week 3:  PT Short Term Goal 1 (Week 3): Pt will perform least restrictive transfer with mod A consistently. PT Short Term Goal 2 (Week 3): Pt will tolerate standing x 5 min with LRAD. PT Short Term Goal 3 (Week 3): Pt will propel wc x 300 ft with supervision.  Skilled Therapeutic Interventions/Progress Updates:    AM session: Pt seated in wc on arrival and agreeable to therapy. Wc propulsion x 150 ft to gym with BUE and supervision. Slideboard transfer wc>mat table with mod A and VC/TCs for technique. Pt required max A to laterally scoot due to body habitus. Gait with RW and maxi sky with wc follow x 7 ft, x 13 ft, x 11 ft, x 10 ft. Pt required visual feedback and multi modal cues for upright posture and weight shift. Pt became fatigued after 4 trials and was not able to continue standing activity. Pt reported burning sensation in his legs that did not resolve with rest and demonstrated a tremor in BIL hands with fatigue. Pt performed seated marches 4 x 20 while conversing with recreational therapist. Pt propelled wc 120 ft with BUE and supervision before becoming fatigue. Pt returned to room and remained in wc with all needs in reach. Co-treatment with LRT.   PM session:  Pt received supine in bed, agreeable to therapy. Pt states his legs feel "like jello" after walking today. Supine>EOB supervision with use of bed features. Stedy transfer to wc, CGA. Pt propelled wc with BUE x 100 ft and was instructed on/performed technique to back into elevator. Pt became fatigued and requested transport outside. Once outdoors pt propelled wc with BUE on uneven surfaces 2 x 50 ft, requiring min-mod A at times for navigation. Pt performed 4 x 20 ball taps with 5lb  weighted bar for UE strength and endurance. Pt navigated elevator and propelled wc x 50 ft before becoming fatigued. Returned to room for dependent brief change in stedy. EOB>supine with min A and VCs for technique. Pt left semi reclined in  Bed with alarm set and all needs in reach.  Therapy Documentation Precautions:  Precautions Precautions: Back,Fall,Other (comment) Precaution Booklet Issued: No Precaution Comments: paraparesis Required Braces or Orthoses: Spinal Brace Spinal Brace: Other (comment) Spinal Brace Comments: Figure 8 Brace Restrictions Weight Bearing Restrictions: No General:    Therapy/Group: Individual Therapy  Sharen Counter, SPT 08/16/2020, 12:52 PM

## 2020-08-16 NOTE — Progress Notes (Signed)
Occupational Therapy Session Note  Patient Details  Name: Edi Gorniak MRN: [id number] Date of Birth: 1955-11-18  Today's Date: 08/16/2020 OT Individual Time: 0930-1030 OT Individual Time Calculation (min): 60 min    Short Term Goals: Week 1:  OT Short Term Goal 1 (Week 1): Pt will complete BSC transfer with LRAD and 2 assist OT Short Term Goal 1 - Progress (Week 1): Progressing toward goal OT Short Term Goal 2 (Week 1): Pt will complete UB dressing with Mod A OT Short Term Goal 2 - Progress (Week 1): Met OT Short Term Goal 3 (Week 1): Pt will complete 1 grooming task seated EOB with no more than Mod balance assistance OT Short Term Goal 3 - Progress (Week 1): Met Week 2:  OT Short Term Goal 1 (Week 2): Pt will complete BSC transfer with LRAD and 2 assist OT Short Term Goal 1 - Progress (Week 2): Progressing toward goal OT Short Term Goal 2 (Week 2): Pt will complete LB dressing with mod A using AE PRN OT Short Term Goal 2 - Progress (Week 2): Progressing toward goal OT Short Term Goal 3 (Week 2): Pt will complete toileting tasks with max A OT Short Term Goal 3 - Progress (Week 2): Progressing toward goal OT Short Term Goal 4 (Week 2): Pt will perform UB dressing tasks with min A OT Short Term Goal 4 - Progress (Week 2): Met Week 3:  OT Short Term Goal 2 (Week 3): Pt will complete BSC transfer with LRAD and 2 assist OT Short Term Goal 3 (Week 3): Pt will complete LB dressing with mod A using AE PRN OT Short Term Goal 4 (Week 3): Pt will complete toileting tasks with max A  Skilled Therapeutic Interventions/Progress Updates:    Pt received in bed and pt stated that he just was showered yesterday so today he just wanted to get LB dressed. Using rails, sat to EOB with close S.  Used reacher to don pants over feet.  Therapist A with shoes with AFOs.  Pt then used stedy to rise to stand with Close S.   His standing tolerance in steady is 10-15 seconds max, so used standing intervals to  adjust brief, pull pants up and to challenge pt to stand upright engaging glutes as much as possible.   Pt able to tolerate a total of 6 times before he felt fatigued. Transferred to wc with stedy to complete oral care at sink. He needed urinal and needed A to manage use of urinal.   (per nursing note, pt declined use of digital stimulation as he does not want his wife doing that and feels suppositories will be enough)  Provided pt with a hand out of how he can use a dig stim tool (hands free) and how to purchase off of [employer] should he need one in the future.    Pt resting in wc with all needs met.  Therapy Documentation Precautions:  Precautions Precautions: Back,Fall,Other (comment) Precaution Booklet Issued: No Precaution Comments: paraparesis Required Braces or Orthoses: Spinal Brace Spinal Brace: Other (comment) Spinal Brace Comments: Figure 8 Brace Restrictions Weight Bearing Restrictions: No  Pain: Pain Assessment Pain Scale: 0-10 Pain Score: 4  Pain Type: Acute pain;Chronic pain Pain Location: Back Pain Orientation: Lower;Mid Pain Descriptors / Indicators: Aching;Discomfort;Throbbing Pain Frequency: Constant Pain Onset: On-going Pain Intervention(s): Medication (See eMAR) ADL: ADL Eating: Not assessed Grooming: Setup Where Assessed-Grooming: Bed level Upper Body Bathing: Moderate assistance Where Assessed-Upper Body Bathing: Edge of bed (taking  sitting balance into consideration) Lower Body Bathing: Dependent (+2 assist) Where Assessed-Lower Body Bathing: Bed level Upper Body Dressing: Maximal assistance Where Assessed-Upper Body Dressing: Edge of bed Lower Body Dressing: Dependent Where Assessed-Lower Body Dressing: Bed level Toileting: Dependent (+2 assist) Toilet Transfer: Not assessed Tub/Shower Transfer: Not assessed ADL Comments: Toilet and shower transfers not safe to attempt due to pts trunk control deficits and B LE weakness  Therapy/Group:  Individual Therapy  [place] 08/16/2020, 10:18 AM

## 2020-08-16 NOTE — Progress Notes (Signed)
Recreational Therapy Session Note  Patient Details  Name: Diane Mochizuki MRN: [id number] Date of Birth: 1956/04/26 Today's Date: 08/16/2020  Pain: no c/o Skilled Therapeutic Interventions/Progress Updates: Session focused on activity tolerance, standing balance and ambulation with RW in Maxi sky, discharge planning,  Emotional support during co-treat with PT. Pt performed w/c mobility using BUEs with supervision on the unit, slide board transfer w/c-->mat with mod assist & ambulated in the maxi sky with +2 assist (w/c follow and assistance with RW management).  Pt required tactile cues, visual cue (mirror) and verbal instruction during ambulation.  During seated rest breaks, discussed discharge planning, home set/modifications/recommendations.  Pt states he is happy with extension in rehab stay and is looking forward to getting more therapy in preparation for going home.    Therapy/Group: Co-Treatment Lisl Slingerland 08/16/2020, 3:55 PM

## 2020-08-17 DIAGNOSIS — M5104 Intervertebral disc disorders with myelopathy, thoracic region: Secondary | ICD-10-CM | POA: Diagnosis not present

## 2020-08-17 NOTE — Progress Notes (Signed)
Physical Therapy Session Note  Patient Details  Name: Randall Mccormick MRN: [id number] Date of Birth: 04/07/1956  Today's Date: 08/17/2020 PT Individual Time: 1000-1100 PT Individual Time Calculation (min): 60 min   Short Term Goals: Week 1:  PT Short Term Goal 1 (Week 1): Pt will be max A for transfers with LRAD PT Short Term Goal 1 - Progress (Week 1): Progressing toward goal PT Short Term Goal 2 (Week 1): Pt will be able to maintain sitting balance with mod A for 5 min PT Short Term Goal 2 - Progress (Week 1): Met PT Short Term Goal 3 (Week 1): Pt will be able to propel wc 50 ft with supervision. PT Short Term Goal 3 - Progress (Week 1): Met Week 2:  PT Short Term Goal 1 (Week 2): Pt will perform least restrictive transfer with max A PT Short Term Goal 1 - Progress (Week 2): Met PT Short Term Goal 2 (Week 2): Pt will tolerate standing x 5 min with LRAD PT Short Term Goal 2 - Progress (Week 2): Progressing toward goal PT Short Term Goal 3 (Week 2): Pt will propel w/c x 150 ft with min A PT Short Term Goal 3 - Progress (Week 2): Met PT Short Term Goal 4 (Week 2): Pt will maintain dynamic sitting balance x 5 min with min A PT Short Term Goal 4 - Progress (Week 2): Met Week 3:  PT Short Term Goal 1 (Week 3): Pt will perform least restrictive transfer with mod A consistently. PT Short Term Goal 2 (Week 3): Pt will tolerate standing x 5 min with LRAD. PT Short Term Goal 3 (Week 3): Pt will propel wc x 300 ft with supervision.  Skilled Therapeutic Interventions/Progress Updates:    PAIN 2/10 central low back, treatment to tolerance.   Pt seen this am for 60 min session w/focus on functional mobility, gait training in LiteGait system, balance, functional strengthening   Pt initially oob in wc  and agreeable to treatment.  Already in bilat AFOs from prior session.  wc propulsion x 100ft w/bilat UEs, inefficient stroke length.  Pt educated re LiteGait and harness donned by therapist w/pt  sitting/pt boosts Independently to assist. Sit to stand in LiteGait w/ mod assist, mild bodyweight support provided via LiteGait once in standing. Gait trials as follows: 24ft x 1 24 ft x 1 28 ft x 1 w/significantly improved wt shifting/clearance. Pt required 6-7 min seated recovery between gait trials due to LE fatigue, general endurance. Pt w/limited wt shift to L which improved w/multimodal cues/attention to deficit, cues for upright posture, some scissoring/narrow base esp w/fatigue.  Pt transported to room.  wc to bed via Stedy transfer.  Pt handed off to NT who was assisting w/linen change, personal care at EOB.     Therapy Documentation Precautions:  Precautions Precautions: Back,Fall,Other (comment) Precaution Booklet Issued: No Precaution Comments: paraparesis Required Braces or Orthoses: Spinal Brace Spinal Brace: Other (comment) Spinal Brace Comments: Figure 8 Brace Restrictions Weight Bearing Restrictions: No    Therapy/Group: Individual Therapy   , PT    M  08/17/2020, 12:39 PM  

## 2020-08-17 NOTE — Progress Notes (Signed)
Physical Therapy Session Note  Patient Details  Name: Randall Mccormick MRN: [id number] Date of Birth: [birth year]  Today's Date: 08/17/2020 PT Individual Time: 0354-6568 PT Individual Time Calculation (min): 68 min   Short Term Goals: Week 3:  PT Short Term Goal 1 (Week 3): Pt will perform least restrictive transfer with mod A consistently. PT Short Term Goal 2 (Week 3): Pt will tolerate standing x 5 min with LRAD. PT Short Term Goal 3 (Week 3): Pt will propel wc x 300 ft with supervision.  Skilled Therapeutic Interventions/Progress Updates:    Pt received sitting on BSC over toilet with stedy in place - finishing a BM. Pt agreeable to therapy session. Sit>stand BSC over toilet>stedy with CGA/supervision for safety. Performed ~5x sit<>stands to/from stedy seat with close supervision and static standing in stedy for ~1-64minute each time he came to stand during total assist peri-care. Pt reporting fatigue after this amount of transfers and standing. In stedy high perched position, doffed dirty shirt and donned clean shirt without assist working on core strengthening with decreased UE support. Stedy transfer to w/c. Sitting in w/c donned shorts max assist. Sit<>stand w/c<>stedy with close supervision and pulled pants over hips total assist. Transported to main gym in w/c for time management and energy conservation; however, no +2 assist available therefore transitioned treatment intervention plan. B UE w/c propulsion ~168ft 2x to/from day room with cuing for improved propulsion technique to increase push-stroke distance and energy conservation. B LE reciprocal movement pattern and strengthening using Kinetron from sitting in w/c against 25cm/sec resistance for 42min 2x - pt reports Borg RPE of 17 during this exercise. Performed B UE strengthening and endurance activities using 4lb weighted dowel rod to complete ball toss activity 2x15 reps. Therapist donned B LE [place] Sprystep PLS AFOs in preparation to  ambulate during next PT session. Transported pt back to room and left sitting in w/c with needs in reach.    Therapy Documentation Precautions:  Precautions Precautions: Back,Fall,Other (comment) Precaution Booklet Issued: No Precaution Comments: paraparesis Required Braces or Orthoses: Spinal Brace Spinal Brace: Other (comment) Spinal Brace Comments: Figure 8 Brace Restrictions Weight Bearing Restrictions: No  Pain:  States "not bad" pain in the back also stating "just a little bit." Provided rest breaks and repositioning for pain management.   Therapy/Group: Individual Therapy  Tawana Scale , PT, DPT, CSRS  08/17/2020, 7:57 AM

## 2020-08-17 NOTE — Plan of Care (Signed)
  Problem: Consults Goal: RH SPINAL CORD INJURY PATIENT EDUCATION Description:  See Patient Education module for education specifics.  Outcome: Progressing   Problem: SCI BOWEL ELIMINATION Goal: RH STG MANAGE BOWEL WITH ASSISTANCE Description: STG Manage Bowel with min Assistance. Outcome: Progressing Goal: RH STG SCI MANAGE BOWEL PROGRAM W/ASSIST OR AS APPROPRIATE Description: STG SCI Manage bowel program with min assist or as appropriate. Outcome: Progressing   Problem: SCI BLADDER ELIMINATION Goal: RH STG SCI MANAGE BLADDER PROGRAM W/ASSISTANCE Description: Manage bladder program with min assist Outcome: Progressing   Problem: RH PAIN MANAGEMENT Goal: RH STG PAIN MANAGED AT OR BELOW PT'S PAIN GOAL Description: Pain level less than 4 on scale of 0-10 Outcome: Progressing   Problem: RH KNOWLEDGE DEFICIT SCI Goal: RH STG INCREASE KNOWLEDGE OF SELF CARE AFTER SCI Description: Pt will be able to demonstrate management of bowel and bladder programs with min assist.  Outcome: Progressing   Problem: RH SKIN INTEGRITY Goal: RH STG MAINTAIN SKIN INTEGRITY WITH ASSISTANCE Description: STG Maintain Skin Integrity With min Assistance. Outcome: Progressing

## 2020-08-17 NOTE — Progress Notes (Signed)
Occupational Therapy Session Note  Patient Details  Name: Randall Mccormick MRN: [id number] Date of Birth: 1956-03-27  Today's Date: 08/17/2020 OT Individual Time: 1532-1630 OT Individual Time Calculation (min): 58 min    Short Term Goals: Week 2:  OT Short Term Goal 1 (Week 2): Pt will complete BSC transfer with LRAD and 2 assist OT Short Term Goal 1 - Progress (Week 2): Progressing toward goal OT Short Term Goal 2 (Week 2): Pt will complete LB dressing with mod A using AE PRN OT Short Term Goal 2 - Progress (Week 2): Progressing toward goal OT Short Term Goal 3 (Week 2): Pt will complete toileting tasks with max A OT Short Term Goal 3 - Progress (Week 2): Progressing toward goal OT Short Term Goal 4 (Week 2): Pt will perform UB dressing tasks with min A OT Short Term Goal 4 - Progress (Week 2): Met Week 3:  OT Short Term Goal 2 (Week 3): Pt will complete BSC transfer with LRAD and 2 assist OT Short Term Goal 3 (Week 3): Pt will complete LB dressing with mod A using AE PRN OT Short Term Goal 4 (Week 3): Pt will complete toileting tasks with max A  Skilled Therapeutic Interventions/Progress Updates:    Pt greeted at time of session supine in bed resting, no pain and agreeable to OT session. Supine > sit CGA with bed rail and pt noted to have small amount of urine on brief but declined changing. Donned shorts Max/total A with assist to thread (no reacher present) and sit > stand in [place] CGA for therapist to don over hips. Discussion throughout session regarding home set up and DC home with assist from wife. Stedy > wheelchair and set up at sink for oral hygiene and face washing. Self propel room <> gym and performed SCIFIT level 4.5 for 7 minutes no fatigue or SOB. Rebounder throws 3x20 with 6# ball for core strength and BUE strength. 1# dowel as assist for shoulder and UB stretches as pt noted to be tight, overhead raise, forward reach, shoulder rolls, and scap squeezes all within back  precautions and cues for form. Short STM as well for tight traps with pt reporting that felt less tension. Once in room set up with alarm on call bell in reach wanting to sit up for dinner.   Therapy Documentation Precautions:  Precautions Precautions: Back,Fall,Other (comment) Precaution Booklet Issued: No Precaution Comments: paraparesis Required Braces or Orthoses: Spinal Brace Spinal Brace: Other (comment) Spinal Brace Comments: Figure 8 Brace Restrictions Weight Bearing Restrictions: No    Therapy/Group: Individual Therapy  Viona Gilmore 08/17/2020, 4:33 PM

## 2020-08-17 NOTE — Progress Notes (Signed)
[facility] PHYSICAL MEDICINE & REHABILITATION PROGRESS NOTE  Subjective/Complaints:  Pt willing to do suppository nightly- hasn't had BM with it in the last 24 hours, but had a BM incontinent this AM.  Says he's still getting bladder scanned- but we don't know why.  No other issues.    ROS:   Pt denies SOB, abd pain, CP, N/V/C/D, and vision changes    Objective: Vital Signs: Blood pressure 122/67, pulse 89, temperature 98.7 F (37.1 C), temperature source Oral, resp. rate 20, height 5\' 8"  (1.727 m), weight 103.6 kg, SpO2 95 %. No results found. No results for input(s): WBC, HGB, HCT, PLT in the last 72 hours. No results for input(s): NA, K, CL, CO2, GLUCOSE, BUN, CREATININE, CALCIUM in the last 72 hours.  Intake/Output Summary (Last 24 hours) at 08/17/2020 1704 Last data filed at 08/17/2020 1300 Gross per 24 hour  Intake 977 ml  Output 1690 ml  Net -713 ml        Physical Exam: BP 122/67 (BP Location: Left Arm)   Pulse 89   Temp 98.7 F (37.1 C) (Oral)   Resp 20   Ht 5\' 8"  (1.727 m)   Wt 103.6 kg   SpO2 95%   BMI 34.73 kg/m  Gen: sitting up eating breakfast and watching TV, NAD HEENT: oral mucosa pink and moist, NCAT Cardio: RRR-  No JVD Chest: CTA B/L- no W/R/R- good air movement Abd: Soft, NT, ND, (+)BS  Ext: no edema Psych: appropriate-but frustrated with bladder scans Skin: intact Motor: Bilateral upper extremities: 5/5  Right lower extremity: Hip flexion, knee extension 2+/5, ankle dorsiflexion 4-/5, stable Left lower extremity: Hip flexion, knee extension 2/5, ankle dorsiflexion 4 -/5, stable Sensation diminished sl to light touch bilateral lower extremities    Assessment/Plan: 1. Functional deficits which require 3+ hours per day of interdisciplinary therapy in a comprehensive inpatient rehab setting.  Physiatrist is providing close team supervision and 24 hour management of active medical problems listed below.  Physiatrist and rehab team continue  to assess barriers to discharge/monitor patient progress toward functional and medical goals   Care Tool:  Bathing    Body parts bathed by patient: Right arm,Left arm,Chest,Abdomen,Front perineal area,Right upper leg,Left upper leg,Face   Body parts bathed by helper: Buttocks,Right lower leg,Left lower leg     Bathing assist Assist Level: Minimal Assistance - Patient > 75%     Upper Body Dressing/Undressing Upper body dressing   What is the patient wearing?: Pull over shirt    Upper body assist Assist Level: Set up assist    Lower Body Dressing/Undressing Lower body dressing      What is the patient wearing?: Incontinence brief,Pants     Lower body assist Assist for lower body dressing: Maximal Assistance - Patient 25 - 49%     Toileting Toileting    Toileting assist Assist for toileting: Dependent - Patient 0% Assistive Device Comment: pericare after urinal   Transfers Chair/bed transfer  Transfers assist     Chair/bed transfer assist level: Moderate Assistance - Patient 50 - 74% (slideboard)     Locomotion Ambulation   Ambulation assist   Ambulation activity did not occur: Safety/medical concerns  Assist level: 2 helpers Assistive device: Lite Gait Max distance: 24   Walk 10 feet activity   Assist  Walk 10 feet activity did not occur: Safety/medical concerns  Assist level: 2 helpers Assistive device: Lite Gait   Walk 50 feet activity   Assist Walk 50 feet with  2 turns activity did not occur: Safety/medical concerns         Walk 150 feet activity   Assist Walk 150 feet activity did not occur: Safety/medical concerns         Walk 10 feet on uneven surface  activity   Assist Walk 10 feet on uneven surfaces activity did not occur: Safety/medical concerns         Wheelchair     Assist Will patient use wheelchair at discharge?: Yes Type of Wheelchair: Manual    Wheelchair assist level: Supervision/Verbal cueing Max  wheelchair distance: 168ft    Wheelchair 50 feet with 2 turns activity    Assist        Assist Level: Supervision/Verbal cueing   Wheelchair 150 feet activity     Assist      Assist Level: Supervision/Verbal cueing    Medical Problem List and Plan: 1.  Bilateral lower extremity weakness with sensory deficits, decreased endurance, poor awareness/lacks insight as well as pain affecting ADLs and mobility secondary to thoracic myelopathy with paraparesis.  Continue CIR PT, OT  2/24- moved d/c date to later in March- moved out 1 week or so.  2.  Antithrombotics: -DVT/anticoagulation:  Pharmaceutical: Lovenox    Lower extremity Dopplers ordered  Dopplers (-)- will continue Lovenox             -antiplatelet therapy: N/a 3. Pain Management: Continue oxycodone prn.              --neuropathy BLE and right chest wall-->continue gabapentin 600 mg tid for now.   2/7- will Add Duloxetine 30 mg QHS and decrease Prozac to 20 mg daily since needs help with nerve pain and already at 600 mg TID of gabapentin  2/16- will increase Oxycodone to 15 mg q4 hours prn   2/17- pain doing better- today- explained might have difficulty giving this much pain meds when leaves, due to  Federal law for first 7 days- will see what we can do.   2/24- pain controlled with current regimen- con't pain meds 4. H/o anxiety/Mood: LCSW to follow for evaluation and support.              Prozaac  2/7- decrease to 20 mg daily so has Duloxetine  2/11- no change in affect  2/18- since on prozac, cannot increase Duloxetine  2/21- mood better last few days- con't regimen             -antipsychotic agents: Continue Zyprexa 5. Neuropsych: This patient is capable of making decisions on his own behalf. 6. Skin/Wound Care: Routine pressure relief measures. Hypoallergenic sheets.   Prevalon boots for both heels ordered 7. Fluids/Electrolytes/Nutrition: Monitor I/Os.    8. Neurogenic bladder:   UA unremarkable, urine  culture pending  2/7- will recheck U/A and Cx due to incontinence AND leukocytosis  2/8- F/U U/A is strongly (+) for UTI- started Keflex 500 mg TID- awaiting Cx- will recheck CBC in AM- wait on stopping Flomax.  2/9- U Cx (+) for Ecoli- pansensitive- WBC down to 14k- will con't Keflex 500 mg TID for a total of 7 days.    2/10- labs in AM- CBC-diff and BMP to make sure things normalizing.  2/11- CBC shows WBC down to 11k- con't Keflex for 7 days total   PVRs  2/13 improving voids (PVR 150-200-438), continent   -continue Flomax, OOB to void  2/14- increased Flomax to 0.8 mg qevening- since required cath x1-   2/16- hasn't required cath  in last 48 hours  2/23- no caths required- no retention since increased Flomax - will stop PVRs and bladder scans  2/24- I went and cancelled them again today-  9. HTN: Monitor BP   Norvasc  Mildly elevated on 2/6, monitor for trend  2/23- BP controlled- con't regimen             Monitor with increased mobility 10. Neurogenic bowel:    KUB reviewed, unremarkable             Miralax daily and dulcolax supp w/dig stim after supper.   2/9- refused all bowel program x5+ days- explained to pt why he needs it- should be able to avoid "2 days of stools", but the more he waits, the more chance he will have  A 1-2 day "blowout", which will be due to not emptying gut. He said he would try it.  2/10- refused bowel program again. Incontinent- doesn't understand can't control bowels.  2/13- pt continues to refuse bowel program. Had a frank conversation as to why we're doing it. Pt seemed to listen/understand. We'll see how it goes tonight  2/17- discussed bowel program and how it's to help reduce accidents over 3-6 weeks, not instantly- explained to pt/wife- can also order dig stim stick and suppository inserter as well- explained they are on [place] if wife wants to get them. Con't bowel program- had bowel accident last night- no BM with program.  2/21- Tolerating bowel  program nightly- still having accidents, it's in the first week, so expected.  2/23- wasn't doing bowel program- restarted Suppository but refuses dig stim  2/24- still having accidents- will likely continue since only doing suppository- but pt refuses dig stim  11.  Hypoalbuminemia  Supplement initiated on 2/5 12.  Transaminitis  Resolved 13.  Leukocytosis  WBC 12.1 on 2/5, labs ordered for tomorrow  2/7- WBC 17.7!- will check U/A and Cx again AND CXR since WBC up so much- not on steroids. Afebrile and says he doesn't feel ill.   2/8- has UTI- denied Sx's of UTI, however has incontinence- Keflex 500 mg TID- will wait for Cx results.   2/9- Cx shows ECOLI- PAN-SENSITIVE- CON'T Keflex  2/11- Keflex x total of 7 days-   2/15- WBC 8.2- resolved 14. Dispo  2/17- family conference today- spent 30 minutes in discussions about medical issues- as detailed about- explained to pt I know he gets scared when confronting something new- explained in stead of hearing I can't- lets try "i'm going to try".      LOS: 20 days A FACE TO FACE EVALUATION WAS PERFORMED  Chrystie Hagwood 08/17/2020, 5:04 PM

## 2020-08-18 DIAGNOSIS — M5104 Intervertebral disc disorders with myelopathy, thoracic region: Secondary | ICD-10-CM | POA: Diagnosis not present

## 2020-08-18 NOTE — Progress Notes (Signed)
Physical Therapy Session Note  Patient Details  Name: Randall Mccormick MRN: [id number] Date of Birth: Nov 04, 1955  Today's Date: 08/18/2020 PT Individual Time:1106-1200   62mn  Short Term Goals: Week 3:  PT Short Term Goal 1 (Week 3): Pt will perform least restrictive transfer with mod A consistently. PT Short Term Goal 2 (Week 3): Pt will tolerate standing x 5 min with LRAD. PT Short Term Goal 3 (Week 3): Pt will propel wc x 300 ft with supervision.  Skilled Therapeutic Interventions/Progress Updates:   Pt received sitting in WC and agreeable to PT. WC mobility in hall of rehab unit 2 x [id number] x [id number] assist in rehab unit and min assist intermittently for obstacles navigation. Sit<>stand in parallel bars x 5 with min-mod assist and cues for LE position due to extensor response in the LLE. pregait weight shift 2 x 5 Bil and reciprocal march x 5 bil with stance LE blocked. Gait training in parallel bars [id number] ft each direction. With stance limb blocked with each step. Noted to have heavy posterior bias, and able to correct slightly with cues. Patient returned to room in WC[facility] from PT. Pt  left sitting in WC with call bell in reach and all needs met.           Therapy Documentation Precautions:  Precautions Precautions: Back,Fall,Other (comment) Precaution Booklet Issued: No Precaution Comments: paraparesis Required Braces or Orthoses: Spinal Brace Spinal Brace: Other (comment) Spinal Brace Comments: Figure 8 Brace Restrictions Weight Bearing Restrictions: No Pain: Pain Assessment Pain Scale: 0-10 Pain Score: 2  Pain Type: Acute pain Pain Location: Back Pain Orientation: Right Pain Intervention(s): Medication (See eMAR)    Therapy/Group: Individual Therapy  AustLorie Phenix5/2022, 11:45 AM

## 2020-08-18 NOTE — Progress Notes (Signed)
Physical Therapy Session Note  Patient Details  Name: Randall Mccormick MRN: [id number] Date of Birth: September 03, 1955  Today's Date: 08/18/2020 PT Individual Time: 1445-1530 PT Individual Time Calculation (min): 45 min   Short Term Goals: Week 1:  PT Short Term Goal 1 (Week 1): Pt will be max A for transfers with LRAD PT Short Term Goal 1 - Progress (Week 1): Progressing toward goal PT Short Term Goal 2 (Week 1): Pt will be able to maintain sitting balance with mod A for 5 min PT Short Term Goal 2 - Progress (Week 1): Met PT Short Term Goal 3 (Week 1): Pt will be able to propel wc 50 ft with supervision. PT Short Term Goal 3 - Progress (Week 1): Met Week 2:  PT Short Term Goal 1 (Week 2): Pt will perform least restrictive transfer with max A PT Short Term Goal 1 - Progress (Week 2): Met PT Short Term Goal 2 (Week 2): Pt will tolerate standing x 5 min with LRAD PT Short Term Goal 2 - Progress (Week 2): Progressing toward goal PT Short Term Goal 3 (Week 2): Pt will propel w/c x 150 ft with min A PT Short Term Goal 3 - Progress (Week 2): Met PT Short Term Goal 4 (Week 2): Pt will maintain dynamic sitting balance x 5 min with min A PT Short Term Goal 4 - Progress (Week 2): Met Week 3:  PT Short Term Goal 1 (Week 3): Pt will perform least restrictive transfer with mod A consistently. PT Short Term Goal 2 (Week 3): Pt will tolerate standing x 5 min with LRAD. PT Short Term Goal 3 (Week 3): Pt will propel wc x 300 ft with supervision.  Skilled Therapeutic Interventions/Progress Updates:    pt received in bed and agreeable to therapy. Pt reported 3/10 pain in back at start and end of session and had already received meds per pt. Pt directed in supine >sit min A for trunk. Pt then directed in Sit to stand in stedy min A and requested to stand to urinate, total A for clothing and urinal management. Pt then directed in Stand pivot transfer with stedy to WC total A. Pt directed in x10 Sit to stand from [facility]  level to stedy min A -mod A with fatigue with VC for one hand at [facility] arm rest to achieve standing for progression to standing from WC. Pt then directed in x2 Sit to stand from [facility] to stedy with x8 marching each LE in place with VC for placement of BLE with return to stance. Pt then transferred back to bed in stedy total A, min A for BLE management to bedside from sitting. Pt left in bed, All needs in reach and in good condition. Call light in hand.  And alarm set.   Therapy Documentation Precautions:  Precautions Precautions: Back,Fall,Other (comment) Precaution Booklet Issued: No Precaution Comments: paraparesis Required Braces or Orthoses: Spinal Brace Spinal Brace: Other (comment) Spinal Brace Comments: Figure 8 Brace Restrictions Weight Bearing Restrictions: No General:   Vital Signs: Therapy Vitals Pulse Rate: 88 Resp: 15 BP: 126/65 Patient Position (if appropriate): Sitting Oxygen Therapy SpO2: 100 % O2 Device: Room Air Pain: Pain Assessment Pain Score: 1  Mobility:   Locomotion :    Trunk/Postural Assessment :    Balance:   Exercises:   Other Treatments:      Therapy/Group: Individual Therapy  Junie Panning 08/18/2020, 4:07 PM

## 2020-08-18 NOTE — Progress Notes (Signed)
Occupational Therapy Weekly Progress Note  Patient Details  Name: Randall Mccormick MRN: [id number] Date of Birth: August 24, 1955  Beginning of progress report period: August 12, 2020 End of progress report period: August 18, 2020  Today's Date: 08/18/2020 OT Individual Time: 0901-1004 OT Individual Time Calculation (min): 63 min    Patient has met 0 of 3 short term goals.  Mr. Jenkinson continues to make steady progress with OT at this time, however increased LE weakness continues to persist.  He is able to complete UB selfcare in sitting with supervision.  Mod assist is needed for LB bathing sit to stand and Max assist for LB dressing sit to stand in the [place].  He has not been able to efficiently stand without use of the Stedy except in the parallel bars.  All transfers at this point are still dependent on use of the [place].  Feel he will need continued CIR level therapy to continue progressing until expected discharge 3/11. With continued progression in standing vs completion of ADLs in sitting with lateral leans.     Patient continues to demonstrate the following deficits: muscle weakness and muscle paralysis, unbalanced muscle activation and decreased standing balance and decreased balance strategies and therefore will continue to benefit from skilled OT intervention to enhance overall performance with BADL and Reduce care partner burden.  Patient progressing toward long term goals..  Continue plan of care.  OT Short Term Goals Week 4:  OT Short Term Goal 1 (Week 4): Pt will complete LB dressing with mod A using AE PRN OT Short Term Goal 2 (Week 4): Pt will complete toileting tasks with max A OT Short Term Goal 3 (Week 4): Pt will complete sliding board transfers to the drop arm commode with mod assist.  Skilled Therapeutic Interventions/Progress Updates:    Pt worked on shower and dressing during session.  Supervision for transfer from supine to sit EOB with use of the [facility] for transfer to the  3:1 in the shower.  He was able to complete sit to stand in the [place] with min assist.  UB bathing was completed with supervision.  He used the [facility] sponge for washing his lower legs in sitting.  Therapist assisted with washing buttocks after he transferred out of the shower and was standing with use of the [place].  He maintains flexed trunk in standing with inability to let go with either UE to assist with managing clothing.  He was able to complete donning his pullover shirt with supervision EOB.  Reacher was utilized with supervision for donning the brief and pants over his feet with total assist to pull them up over his hips while standing in the [place].  Therapist then assisted with donning socks and gripper socks before transferring over to the wheelchair with use of the [place] for grooming tasks at the sink.  He was able to complete oral hygiene at modified independent level from seated position.  Finished session with pt sitting in the wheelchair with the call button and phone in reach and safety belt in place.    Therapy Documentation Precautions:  Precautions Precautions: Back,Fall,Other (comment) Precaution Booklet Issued: No Precaution Comments: paraparesis Required Braces or Orthoses: Spinal Brace Spinal Brace: Other (comment) Spinal Brace Comments: Figure 8 Brace Restrictions Weight Bearing Restrictions: No General:   Vital Signs:   Pain: Pain Assessment Pain Scale: PAINAD Faces Pain Scale: Hurts little more Pain Type: Acute pain;Surgical pain Pain Location: Back Pain Orientation: Lower Pain Descriptors / Indicators: Discomfort Pain Onset:  With Activity Pain Intervention(s): Repositioned ADL: ADL Eating: Not assessed Grooming: Setup Where Assessed-Grooming: Bed level Upper Body Bathing: Moderate assistance Where Assessed-Upper Body Bathing: Edge of bed (taking sitting balance into consideration) Lower Body Bathing: Dependent (+2 assist) Where Assessed-Lower Body Bathing:  Bed level Upper Body Dressing: Maximal assistance Where Assessed-Upper Body Dressing: Edge of bed Lower Body Dressing: Dependent Where Assessed-Lower Body Dressing: Bed level Toileting: Dependent (+2 assist) Toilet Transfer: Not assessed Tub/Shower Transfer: Not assessed ADL Comments: Toilet and shower transfers not safe to attempt due to pts trunk control deficits and B LE weakness Vision   Perception    Praxis   Exercises:   Other Treatments:     Therapy/Group: Individual Therapy  MCGUIRE,JAMES 08/18/2020, 12:45 PM

## 2020-08-18 NOTE — Progress Notes (Signed)
[facility] PHYSICAL MEDICINE & REHABILITATION PROGRESS NOTE  Subjective/Complaints:  Pt reports didn't get bladder scanned yesterday- so was happy about that- nursing tried, but explained wasn't in orders.   Walked 48 ft with RW yesterday which is great LBM yesterday.   ROS:   Pt denies SOB, abd pain, CP, N/V/C/D, and vision changes   Objective: Vital Signs: Blood pressure 118/72, pulse 77, temperature 97.9 F (36.6 C), temperature source Oral, resp. rate 18, height 5\' 8"  (1.727 m), weight 103.6 kg, SpO2 95 %. No results found. No results for input(s): WBC, HGB, HCT, PLT in the last 72 hours. No results for input(s): NA, K, CL, CO2, GLUCOSE, BUN, CREATININE, CALCIUM in the last 72 hours.  Intake/Output Summary (Last 24 hours) at 08/18/2020 0842 Last data filed at 08/18/2020 0700 Gross per 24 hour  Intake 560 ml  Output 1195 ml  Net -635 ml        Physical Exam: BP 118/72 (BP Location: Left Arm)   Pulse 77   Temp 97.9 F (36.6 C) (Oral)   Resp 18   Ht 5\' 8"  (1.727 m)   Wt 103.6 kg   SpO2 95%   BMI 34.73 kg/m  Gen: sitting up finished breakfast, appropriate, NAD HEENT: oral mucosa pink and moist, NCAT Cardio: RRR- no JVD Chest: CTA B/L- no W/R/R- good air movement Abd: Soft, NT, ND, (+)BS  Ext: no edema Psych: brighter affect- more appropriate Skin: intact Motor: Bilateral upper extremities: 5/5  Right lower extremity: Hip flexion, knee extension 2+/5, ankle dorsiflexion 4-/5, stable Left lower extremity: Hip flexion, knee extension 2/5, ankle dorsiflexion 4 -/5, stable Sensation diminished sl to light touch bilateral lower extremities    Assessment/Plan: 1. Functional deficits which require 3+ hours per day of interdisciplinary therapy in a comprehensive inpatient rehab setting.  Physiatrist is providing close team supervision and 24 hour management of active medical problems listed below.  Physiatrist and rehab team continue to assess barriers to  discharge/monitor patient progress toward functional and medical goals   Care Tool:  Bathing    Body parts bathed by patient: Right arm,Left arm,Chest,Abdomen,Front perineal area,Right upper leg,Left upper leg,Face   Body parts bathed by helper: Buttocks,Right lower leg,Left lower leg     Bathing assist Assist Level: Minimal Assistance - Patient > 75%     Upper Body Dressing/Undressing Upper body dressing   What is the patient wearing?: Pull over shirt    Upper body assist Assist Level: Set up assist    Lower Body Dressing/Undressing Lower body dressing      What is the patient wearing?: Incontinence brief,Pants     Lower body assist Assist for lower body dressing: Maximal Assistance - Patient 25 - 49%     Toileting Toileting    Toileting assist Assist for toileting: Dependent - Patient 0% Assistive Device Comment: pericare after urinal   Transfers Chair/bed transfer  Transfers assist     Chair/bed transfer assist level: Dependent - mechanical lift (stedy)     Locomotion Ambulation   Ambulation assist   Ambulation activity did not occur: Safety/medical concerns  Assist level: 2 helpers Assistive device: Lite Gait Max distance: 24   Walk 10 feet activity   Assist  Walk 10 feet activity did not occur: Safety/medical concerns  Assist level: 2 helpers Assistive device: Lite Gait   Walk 50 feet activity   Assist Walk 50 feet with 2 turns activity did not occur: Safety/medical concerns  Walk 150 feet activity   Assist Walk 150 feet activity did not occur: Safety/medical concerns         Walk 10 feet on uneven surface  activity   Assist Walk 10 feet on uneven surfaces activity did not occur: Safety/medical concerns         Wheelchair     Assist Will patient use wheelchair at discharge?: Yes Type of Wheelchair: Manual    Wheelchair assist level: Supervision/Verbal cueing,Set up assist Max wheelchair distance:  164ft    Wheelchair 50 feet with 2 turns activity    Assist        Assist Level: Supervision/Verbal cueing   Wheelchair 150 feet activity     Assist      Assist Level: Supervision/Verbal cueing    Medical Problem List and Plan: 1.  Bilateral lower extremity weakness with sensory deficits, decreased endurance, poor awareness/lacks insight as well as pain affecting ADLs and mobility secondary to thoracic myelopathy with paraparesis.  Continue CIR PT, OT  2/24- moved d/c date to later in March- moved out 1 week or so.  2.  Antithrombotics: -DVT/anticoagulation:  Pharmaceutical: Lovenox    Lower extremity Dopplers ordered  Dopplers (-)- will continue Lovenox  2/25- will need Lovenox for 2 month from date of surgery.              -antiplatelet therapy: N/a 3. Pain Management: Continue oxycodone prn.              --neuropathy BLE and right chest wall-->continue gabapentin 600 mg tid for now.   2/7- will Add Duloxetine 30 mg QHS and decrease Prozac to 20 mg daily since needs help with nerve pain and already at 600 mg TID of gabapentin  2/16- will increase Oxycodone to 15 mg q4 hours prn   2/17- pain doing better- today- explained might have difficulty giving this much pain meds when leaves, due to  Federal law for first 7 days- will see what we can do.   2/25- pain controlled- will see if pain doing better, so can try to wean Oxy prn if possible 4. H/o anxiety/Mood: LCSW to follow for evaluation and support.              Prozaac  2/7- decrease to 20 mg daily so has Duloxetine  2/11- no change in affect  2/18- since on prozac, cannot increase Duloxetine  2/21- mood better last few days- con't regimen             -antipsychotic agents: Continue Zyprexa 5. Neuropsych: This patient is capable of making decisions on his own behalf. 6. Skin/Wound Care: Routine pressure relief measures. Hypoallergenic sheets.   Prevalon boots for both heels ordered 7.  Fluids/Electrolytes/Nutrition: Monitor I/Os.    8. Neurogenic bladder:   UA unremarkable, urine culture pending  2/7- will recheck U/A and Cx due to incontinence AND leukocytosis  2/8- F/U U/A is strongly (+) for UTI- started Keflex 500 mg TID- awaiting Cx- will recheck CBC in AM- wait on stopping Flomax.  2/9- U Cx (+) for Ecoli- pansensitive- WBC down to 14k- will con't Keflex 500 mg TID for a total of 7 days.    2/10- labs in AM- CBC-diff and BMP to make sure things normalizing.  2/11- CBC shows WBC down to 11k- con't Keflex for 7 days total   PVRs  2/13 improving voids (PVR 150-200-438), continent   -continue Flomax, OOB to void  2/14- increased Flomax to 0.8 mg qevening- since required  cath x1-   2/16- hasn't required cath in last 48 hours  2/23- no caths required- no retention since increased Flomax - will stop PVRs and bladder scans  2/24- I went and cancelled them again today-   2/25- no more bladder scans- con't regimen 9. HTN: Monitor BP   Norvasc  Mildly elevated on 2/6, monitor for trend  2/23- BP controlled- con't regimen             Monitor with increased mobility 10. Neurogenic bowel:    KUB reviewed, unremarkable             Miralax daily and dulcolax supp w/dig stim after supper.   2/9- refused all bowel program x5+ days- explained to pt why he needs it- should be able to avoid "2 days of stools", but the more he waits, the more chance he will have  A 1-2 day "blowout", which will be due to not emptying gut. He said he would try it.  2/10- refused bowel program again. Incontinent- doesn't understand can't control bowels.  2/13- pt continues to refuse bowel program. Had a frank conversation as to why we're doing it. Pt seemed to listen/understand. We'll see how it goes tonight  2/17- discussed bowel program and how it's to help reduce accidents over 3-6 weeks, not instantly- explained to pt/wife- can also order dig stim stick and suppository inserter as well- explained they  are on [place] if wife wants to get them. Con't bowel program- had bowel accident last night- no BM with program.  2/21- Tolerating bowel program nightly- still having accidents, it's in the first week, so expected.  2/23- wasn't doing bowel program- restarted Suppository but refuses dig stim  2/25- still has accidents- but expected without dig stim- is having regular BMs  11.  Hypoalbuminemia  Supplement initiated on 2/5 12.  Transaminitis  Resolved 13.  Leukocytosis  WBC 12.1 on 2/5, labs ordered for tomorrow  2/7- WBC 17.7!- will check U/A and Cx again AND CXR since WBC up so much- not on steroids. Afebrile and says he doesn't feel ill.   2/8- has UTI- denied Sx's of UTI, however has incontinence- Keflex 500 mg TID- will wait for Cx results.   2/9- Cx shows ECOLI- PAN-SENSITIVE- CON'T Keflex  2/11- Keflex x total of 7 days-   2/15- WBC 8.2- resolved 14. Dispo  2/17- family conference today- spent 30 minutes in discussions about medical issues- as detailed about- explained to pt I know he gets scared when confronting something new- explained in stead of hearing I can't- lets try "i'm going to try".   2/25- walked 75 ft with RW- pt very excited about improvement   LOS: 21 days A FACE TO FACE EVALUATION WAS PERFORMED  Berdella Bacot 08/18/2020, 8:42 AM

## 2020-08-19 NOTE — Plan of Care (Signed)
  Problem: Consults Goal: RH SPINAL CORD INJURY PATIENT EDUCATION Description:  See Patient Education module for education specifics.  Outcome: Progressing   Problem: SCI BOWEL ELIMINATION Goal: RH STG MANAGE BOWEL WITH ASSISTANCE Description: STG Manage Bowel with min Assistance. Outcome: Progressing Goal: RH STG SCI MANAGE BOWEL PROGRAM W/ASSIST OR AS APPROPRIATE Description: STG SCI Manage bowel program with min assist or as appropriate. Outcome: Progressing   Problem: SCI BLADDER ELIMINATION Goal: RH STG SCI MANAGE BLADDER PROGRAM W/ASSISTANCE Description: Manage bladder program with min assist Outcome: Progressing   Problem: RH PAIN MANAGEMENT Goal: RH STG PAIN MANAGED AT OR BELOW PT'S PAIN GOAL Description: Pain level less than 4 on scale of 0-10 Outcome: Progressing   Problem: RH KNOWLEDGE DEFICIT SCI Goal: RH STG INCREASE KNOWLEDGE OF SELF CARE AFTER SCI Description: Pt will be able to demonstrate management of bowel and bladder programs with min assist.  Outcome: Progressing   Problem: RH SKIN INTEGRITY Goal: RH STG MAINTAIN SKIN INTEGRITY WITH ASSISTANCE Description: STG Maintain Skin Integrity With min Assistance. Outcome: Progressing

## 2020-08-20 DIAGNOSIS — M5104 Intervertebral disc disorders with myelopathy, thoracic region: Secondary | ICD-10-CM | POA: Diagnosis not present

## 2020-08-20 NOTE — Progress Notes (Signed)
Physical Therapy Session Note  Patient Details  Name: Randall Mccormick MRN: [id number] Date of Birth: Nov 24, 1955  Today's Date: 08/20/2020 PT Individual Time: 6811-5726 PT Individual Time Calculation (min): 65 min   Short Term Goals: Week 3:  PT Short Term Goal 1 (Week 3): Pt will perform least restrictive transfer with mod A consistently. PT Short Term Goal 2 (Week 3): Pt will tolerate standing x 5 min with LRAD. PT Short Term Goal 3 (Week 3): Pt will propel wc x 300 ft with supervision.  Skilled Therapeutic Interventions/Progress Updates:    Pt received seated in wc with wife present, agreeable to therapy. Pt propelled wc x200 ft with BUE and supervision, demonstrating improved propulsion technique. Pt performed slideboard transfer wc<>mat table with min A, mod A with fatigue. Pt stood with lite gait and min A x 4 but was unable to take steps today due to knee buckling. Pt reports that he needs to urinate, stedy transport to bathroom to use urinal. STS with stedy x 3 with Min A, dependent for clothing management and urinal placement. Pt performed marches in stedy x 10, was too fatigued to continue. STS x 5 with stedy and min A to fatigue. Pt propelled wc with BUE and supervision x 100 ft, x 300 ft with 1 rest break. Pt required VCs for longer strokes as he became fatigued. Pt returned to room and remained seated in wc with all needs in reach.  Therapy Documentation Precautions:  Precautions Precautions: Back,Fall,Other (comment) Precaution Booklet Issued: No Precaution Comments: paraparesis Required Braces or Orthoses: Spinal Brace Spinal Brace: Other (comment) Spinal Brace Comments: Figure 8 Brace Restrictions Weight Bearing Restrictions: No    Therapy/Group: Individual Therapy  Sharen Counter, SPT 08/20/2020, 5:21 PM

## 2020-08-20 NOTE — Progress Notes (Signed)
[facility] PHYSICAL MEDICINE & REHABILITATION PROGRESS NOTE  Subjective/Complaints:  Pt reports didn't sleep as well, woke up at 4am til 5-6 am- wants to go back to sleep Hasn't eaten breakfast yet.  Doesn't have therapy today- is rest day. NO issues   ROS:   Pt denies SOB, abd pain, CP, N/V/C/D, and vision changes    Objective: Vital Signs: Blood pressure 136/77, pulse 78, temperature 98.2 F (36.8 C), resp. rate 18, height 5\' 8"  (1.727 m), weight 103.6 kg, SpO2 93 %. No results found. No results for input(s): WBC, HGB, HCT, PLT in the last 72 hours. No results for input(s): NA, K, CL, CO2, GLUCOSE, BUN, CREATININE, CALCIUM in the last 72 hours.  Intake/Output Summary (Last 24 hours) at 08/20/2020 1029 Last data filed at 08/20/2020 0342 Gross per 24 hour  Intake 837 ml  Output 950 ml  Net -113 ml        Physical Exam: BP 136/77 (BP Location: Right Arm)   Pulse 78   Temp 98.2 F (36.8 C)   Resp 18   Ht 5\' 8"  (1.727 m)   Wt 103.6 kg   SpO2 93%   BMI 34.73 kg/m    General: awake, alert, appropriate, NAD- sleepy, but woke to stimuli HENT: conjugate gaze; oropharynx moist CV: regular rate; no JVD Pulmonary: CTA B/L; no W/R/R- good air movement GI: soft, NT, ND, (+)BS Psychiatric: sleepy Neurological: Ox3 Skin: intact Motor: Bilateral upper extremities: 5/5  Right lower extremity: Hip flexion, knee extension 2+/5, ankle dorsiflexion 4-/5, stable Left lower extremity: Hip flexion, knee extension 2/5, ankle dorsiflexion 4 -/5, stable Sensation diminished sl to light touch bilateral lower extremities    Assessment/Plan: 1. Functional deficits which require 3+ hours per day of interdisciplinary therapy in a comprehensive inpatient rehab setting.  Physiatrist is providing close team supervision and 24 hour management of active medical problems listed below.  Physiatrist and rehab team continue to assess barriers to discharge/monitor patient progress toward  functional and medical goals   Care Tool:  Bathing    Body parts bathed by patient: Right arm,Left arm,Chest,Abdomen,Front perineal area,Right upper leg,Left upper leg,Face,Right lower leg,Left lower leg   Body parts bathed by helper: Buttocks     Bathing assist Assist Level: Minimal Assistance - Patient > 75% (standing on the stedy for washing buttocks after shower)     Upper Body Dressing/Undressing Upper body dressing   What is the patient wearing?: Pull over shirt    Upper body assist Assist Level: Supervision/Verbal cueing    Lower Body Dressing/Undressing Lower body dressing      What is the patient wearing?: Incontinence brief,Pants     Lower body assist Assist for lower body dressing: Moderate Assistance - Patient 50 - 74%     Toileting Toileting    Toileting assist Assist for toileting: Dependent - Patient 0% Assistive Device Comment: pericare after urinal   Transfers Chair/bed transfer  Transfers assist     Chair/bed transfer assist level: Dependent - mechanical lift (Stedy)     Locomotion Ambulation   Ambulation assist   Ambulation activity did not occur: Safety/medical concerns  Assist level: 2 helpers Assistive device: Lite Gait Max distance: 24   Walk 10 feet activity   Assist  Walk 10 feet activity did not occur: Safety/medical concerns  Assist level: 2 helpers Assistive device: Lite Gait   Walk 50 feet activity   Assist Walk 50 feet with 2 turns activity did not occur: Safety/medical concerns  Walk 150 feet activity   Assist Walk 150 feet activity did not occur: Safety/medical concerns         Walk 10 feet on uneven surface  activity   Assist Walk 10 feet on uneven surfaces activity did not occur: Safety/medical concerns         Wheelchair     Assist Will patient use wheelchair at discharge?: Yes Type of Wheelchair: Manual    Wheelchair assist level: Supervision/Verbal cueing,Set up  assist Max wheelchair distance: 164ft    Wheelchair 50 feet with 2 turns activity    Assist        Assist Level: Supervision/Verbal cueing   Wheelchair 150 feet activity     Assist      Assist Level: Supervision/Verbal cueing    Medical Problem List and Plan: 1.  Bilateral lower extremity weakness with sensory deficits, decreased endurance, poor awareness/lacks insight as well as pain affecting ADLs and mobility secondary to thoracic myelopathy with paraparesis.  Continue CIR PT, OT  2/24- moved d/c date to later in March- moved out 1 week or so.  2.  Antithrombotics: -DVT/anticoagulation:  Pharmaceutical: Lovenox    Lower extremity Dopplers ordered  Dopplers (-)- will continue Lovenox  2/25- will need Lovenox for 2 month from date of surgery.              -antiplatelet therapy: N/a 3. Pain Management: Continue oxycodone prn.              --neuropathy BLE and right chest wall-->continue gabapentin 600 mg tid for now.   2/7- will Add Duloxetine 30 mg QHS and decrease Prozac to 20 mg daily since needs help with nerve pain and already at 600 mg TID of gabapentin  2/16- will increase Oxycodone to 15 mg q4 hours prn   2/17- pain doing better- today- explained might have difficulty giving this much pain meds when leaves, due to  Federal law for first 7 days- will see what we can do.   2/25- pain controlled- will see if pain doing better, so can try to wean Oxy prn if possible  2/27- pain stable- will d/w pt Tuesday about trying to wean meds a little 4. H/o anxiety/Mood: LCSW to follow for evaluation and support.              Prozaac  2/7- decrease to 20 mg daily so has Duloxetine  2/11- no change in affect  2/18- since on prozac, cannot increase Duloxetine  2/21- mood better last few days- con't regimen             -antipsychotic agents: Continue Zyprexa 5. Neuropsych: This patient is capable of making decisions on his own behalf. 6. Skin/Wound Care: Routine pressure  relief measures. Hypoallergenic sheets.   Prevalon boots for both heels ordered 7. Fluids/Electrolytes/Nutrition: Monitor I/Os.    8. Neurogenic bladder:   UA unremarkable, urine culture pending  2/7- will recheck U/A and Cx due to incontinence AND leukocytosis  2/8- F/U U/A is strongly (+) for UTI- started Keflex 500 mg TID- awaiting Cx- will recheck CBC in AM- wait on stopping Flomax.  2/9- U Cx (+) for Ecoli- pansensitive- WBC down to 14k- will con't Keflex 500 mg TID for a total of 7 days.    2/10- labs in AM- CBC-diff and BMP to make sure things normalizing.  2/14- increased Flomax to 0.8 mg qevening- since required cath x1-   2/25- no more bladder scans- con't regimen of Flomax 0.8 mg nightly  2/27- no caths required on higher dose of Flomax- con't regimen 9. HTN: Monitor BP   Norvasc  Mildly elevated on 2/6, monitor for trend  2/27- BP 136/70s- con't regimen- well controlled             Monitor with increased mobility 10. Neurogenic bowel:    KUB reviewed, unremarkable             Miralax daily and dulcolax supp w/dig stim after supper.   2/9- refused all bowel program x5+ days- explained to pt why he needs it- should be able to avoid "2 days of stools", but the more he waits, the more chance he will have  A 1-2 day "blowout", which will be due to not emptying gut. He said he would try it.  2/10- refused bowel program again. Incontinent- doesn't understand can't control bowels.  2/13- pt continues to refuse bowel program. Had a frank conversation as to why we're doing it. Pt seemed to listen/understand. We'll see how it goes tonight  2/17- discussed bowel program and how it's to help reduce accidents over 3-6 weeks, not instantly- explained to pt/wife- can also order dig stim stick and suppository inserter as well- explained they are on [place] if wife wants to get them. Con't bowel program- had bowel accident last night- no BM with program.  2/21- Tolerating bowel program nightly-  still having accidents, it's in the first week, so expected.  2/23- wasn't doing bowel program- restarted Suppository but refuses dig stim  2/25- still has accidents- but expected without dig stim- is having regular BMs   2/27- pt isn't concerned about bowel accidents- won't do dig stim- con't suppository nightly 11.  Hypoalbuminemia  Supplement initiated on 2/5 12.  Transaminitis  Resolved 13.  Leukocytosis  WBC 12.1 on 2/5, labs ordered for tomorrow  2/7- WBC 17.7!- will check U/A and Cx again AND CXR since WBC up so much- not on steroids. Afebrile and says he doesn't feel ill.   2/8- has UTI- denied Sx's of UTI, however has incontinence- Keflex 500 mg TID- will wait for Cx results.   2/9- Cx shows ECOLI- PAN-SENSITIVE- CON'T Keflex  2/11- Keflex x total of 7 days-   2/15- WBC 8.2- resolved 14. Dispo  2/17- family conference today- spent 30 minutes in discussions about medical issues- as detailed about- explained to pt I know he gets scared when confronting something new- explained in stead of hearing I can't- lets try "i'm going to try".   2/25- walked 75 ft with RW- pt very excited about improvement   LOS: 23 days A FACE TO FACE EVALUATION WAS PERFORMED  Megan Lovorn 08/20/2020, 10:29 AM

## 2020-08-21 DIAGNOSIS — M5104 Intervertebral disc disorders with myelopathy, thoracic region: Secondary | ICD-10-CM | POA: Diagnosis not present

## 2020-08-21 LAB — COMPREHENSIVE METABOLIC PANEL
ALT: 21 U/L (ref 0–44)
AST: 17 U/L (ref 15–41)
Albumin: 2.8 g/dL — ABNORMAL LOW (ref 3.5–5.0)
Alkaline Phosphatase: 98 U/L (ref 38–126)
Anion gap: 7 (ref 5–15)
BUN: 12 mg/dL (ref 8–23)
CO2: 30 mmol/L (ref 22–32)
Calcium: 9.1 mg/dL (ref 8.9–10.3)
Chloride: 103 mmol/L (ref 98–111)
Creatinine, Ser: 0.84 mg/dL (ref 0.61–1.24)
GFR, Estimated: >60 mL/min (ref >60)
Glucose, Bld: 98 mg/dL (ref 70–99)
Potassium: 4.1 mmol/L (ref 3.5–5.1)
Sodium: 140 mmol/L (ref 135–145)
Total Bilirubin: 0.7 mg/dL (ref 0.3–1.2)
Total Protein: 5.8 g/dL — ABNORMAL LOW (ref 6.5–8.1)

## 2020-08-21 LAB — CBC WITH DIFFERENTIAL/PLATELET
Abs Immature Granulocytes: 0.03 10*3/uL (ref 0.00–0.07)
Basophils Absolute: 0 10*3/uL (ref 0.0–0.1)
Basophils Relative: 0 %
Eosinophils Absolute: 0.3 10*3/uL (ref 0.0–0.5)
Eosinophils Relative: 5 %
HCT: 38.8 % — ABNORMAL LOW (ref 39.0–52.0)
Hemoglobin: 11.9 g/dL — ABNORMAL LOW (ref 13.0–17.0)
Immature Granulocytes: 0 %
Lymphocytes Relative: 26 %
Lymphs Abs: 1.9 10*3/uL (ref 0.7–4.0)
MCH: 28.1 pg (ref 26.0–34.0)
MCHC: 30.7 g/dL (ref 30.0–36.0)
MCV: 91.5 fL (ref 80.0–100.0)
Monocytes Absolute: 0.8 10*3/uL (ref 0.1–1.0)
Monocytes Relative: 11 %
Neutro Abs: 4.2 10*3/uL (ref 1.7–7.7)
Neutrophils Relative %: 58 %
Platelets: 297 10*3/uL (ref 150–400)
RBC: 4.24 MIL/uL (ref 4.22–5.81)
RDW: 14.4 % (ref 11.5–15.5)
WBC: 7.2 10*3/uL (ref 4.0–10.5)
nRBC: 0 % (ref 0.0–0.2)

## 2020-08-21 NOTE — Progress Notes (Signed)
Physical Therapy Session Note  Patient Details  Name: Randall Mccormick MRN: [id number] Date of Birth: 1955/10/19  Today's Date: 08/21/2020 PT Individual Time: 1100-1150, 1445-1530 PT Individual Time Calculation (min): 50 min, 45 min  Short Term Goals: Week 3:  PT Short Term Goal 1 (Week 3): Pt will perform least restrictive transfer with mod A consistently. PT Short Term Goal 2 (Week 3): Pt will tolerate standing x 5 min with LRAD. PT Short Term Goal 3 (Week 3): Pt will propel wc x 300 ft with supervision.  Skilled Therapeutic Interventions/Progress Updates:    AM session: Pt seated in wc on arrival and agreeable to therapy. Today's session focused on functional transfers and strategies for moving safely. Pt propelled wc with BUE and supervision 2 x 200 ft. Pt performed slideboard transfer with min A to an elevated surface. Educated pt to pause and think about his body position in order to transfer safely. STS x 5 with RW, min A and knees blocked. VCs for anterior weight shift and upright posture. Standing marches x 10 to prepare for stand pivot transfer with RW and min A. Pt performed stand pivot transfer x 3 with mod A-max A for anterior weight shift and min A from second person for safety. Pt had poor control when stepping and difficulty stepping forward. He could not maintain upright posture even with VCs/TCs and visual feedback. Pt returned to room for brief change. Pt stood to stedy with min A-CGA x 3 during dependent brief change. Pt stood to stedy with min A  3 x 30 sec to improve standing tolerance before becoming fatigued. Pt remained in wc with all needs in reach. Missed 10 min due to fatigue and bladder accident.  PM session: Pt supine in bed on arrival, agreeable to therapy. Supine<>sit supervision with use of bed features. Pt performed stand pivot transfer bed>wc>mat>wc with min A x 2 throughout session. Required multimodal cues for upright posture and anterior weight shift with hip  extension. Pt propelled wc 2 x 200 ft with BUE and supervision. Pt performed 1/2 stands  3 x 5 with focus on technique and utilizing head hips relationship. Slideboard transfer wc>EOB with min A and VCs/education for technique. Pt remained supine in bed after session with all needs in reach.   Therapy Documentation Precautions:  Precautions Precautions: Back,Fall,Other (comment) Precaution Booklet Issued: No Precaution Comments: paraparesis Required Braces or Orthoses: Spinal Brace Spinal Brace: Other (comment) Spinal Brace Comments: Figure 8 Brace Restrictions Weight Bearing Restrictions: No General: PT Amount of Missed Time (min): 10 Minutes PT Missed Treatment Reason: Bowel/bladder accident;Patient fatigue    Therapy/Group: Individual Therapy  Sharen Counter, SPT 08/21/2020, 11:57 AM

## 2020-08-21 NOTE — Progress Notes (Signed)
Physical Therapy Session Note  Patient Details  Name: Randall Mccormick MRN: [id number] Date of Birth: Aug 14, 1955  Today's Date: 08/21/2020 PT Individual Time: 0830-0900 PT Individual Time Calculation (min): 30 min   Short Term Goals: Week 3:  PT Short Term Goal 1 (Week 3): Pt will perform least restrictive transfer with mod A consistently. PT Short Term Goal 2 (Week 3): Pt will tolerate standing x 5 min with LRAD. PT Short Term Goal 3 (Week 3): Pt will propel wc x 300 ft with supervision.  Skilled Therapeutic Interventions/Progress Updates:    Patient supine in bed upon PT arrival. Patient alert and agreeable to PT session. Patient without pain complaint throughout session.  Therapeutic Activity: Bed Mobility: Patient performed supine -->sit with supervsion requiring extra time and no vc this day for safety, only for squaring hips to EOB for good seated position.  Transfers: Patient performed STS using STEDY throughout session and is able to perform with supervision/ CGA using pull-to-stand technique.  VC initially for foot positioning. Standing tolerance challenged with dressing tasks and pt able to stand for 2 bouts at 3.5 minutes and 2.5 minutes. Good, controlled descent to sit until final 10% of drop to seat with pt able to reach far back in seat for good positioning in w/c.   Sitting balance and unsupported tolerance challenged with dressing tasks and functional reaching tasks requiring reach outside of BOS, across midline, and from mid-shin to shoulder height. No UE required and no LOB noted.  Wheelchair Mobility:  Patient propelled wheelchair within confines of room and is able to maneuver in tight spaces starting between bed and sink, reaching position in front of sink, then maneuvering to park at bedside with minimal cues. Requires Max A for managing leg rests, but manages brakes with Mod I.   Patient seated in w/c at end of session with brakes locked, belt alarm set, and all needs  within reach.  Therapy Documentation Precautions:  Precautions Precautions: Back,Fall,Other (comment) Precaution Booklet Issued: No Precaution Comments: paraparesis Required Braces or Orthoses: Spinal Brace Spinal Brace: Other (comment) Spinal Brace Comments: Figure 8 Brace Restrictions Weight Bearing Restrictions: No  Therapy/Group: Individual Therapy  Alger Simons 08/21/2020, 5:48 PM

## 2020-08-21 NOTE — Progress Notes (Signed)
Patient ID: Randall Mccormick, male   DOB: May 18, 1956, 65 y.o.   MRN: [id number]  Per PT pt to have w/c eval tomorrow with [facility] faxed H&P and demo sheet to [facility] (p:[phone]/[phone]).  Loralee Pacas, MSW, [place] Office: [phone] Cell: [phone] Fax: [phone]

## 2020-08-21 NOTE — Progress Notes (Signed)
[facility] PHYSICAL MEDICINE & REHABILITATION PROGRESS NOTE  Subjective/Complaints: Patient's chart reviewed- No issues reported overnight Systolic BP elevated- Vitals otherwise stable. Flowsheet reviewed and has been labile.  Hgb 11.9  ROS:   Pt denies SOB, abd pain, CP, N/V/C/D, and vision changes  Objective: Vital Signs: Blood pressure (!) 149/83, pulse 78, temperature 98.1 F (36.7 C), temperature source Oral, resp. rate 18, height 5\' 8"  (1.727 m), weight 103.6 kg, SpO2 91 %. No results found. Recent Labs    08/21/20 0519  WBC 7.2  HGB 11.9*  HCT 38.8*  PLT 297   Recent Labs    08/21/20 0519  NA 140  K 4.1  CL 103  CO2 30  GLUCOSE 98  BUN 12  CREATININE 0.84  CALCIUM 9.1    Intake/Output Summary (Last 24 hours) at 08/21/2020 0829 Last data filed at 08/20/2020 2207 Gross per 24 hour  Intake 860 ml  Output 1400 ml  Net -540 ml        Physical Exam: BP (!) 149/83 (BP Location: Left Arm)   Pulse 78   Temp 98.1 F (36.7 C) (Oral)   Resp 18   Ht 5\' 8"  (1.727 m)   Wt 103.6 kg   SpO2 91%   BMI 34.73 kg/m  Gen: no distress, normal appearing HEENT: oral mucosa pink and moist, NCAT Cardio: Reg rate Chest: normal effort, normal rate of breathing Abd: soft, non-distended Ext: no edema Psych: pleasant, normal affect Skin: intact Motor: Bilateral upper extremities: 5/5  Right lower extremity: Hip flexion, knee extension 2+/5, ankle dorsiflexion 4-/5, stable Left lower extremity: Hip flexion, knee extension 2/5, ankle dorsiflexion 4 -/5, stable Sensation diminished sl to light touch bilateral lower extremities    Assessment/Plan: 1. Functional deficits which require 3+ hours per day of interdisciplinary therapy in a comprehensive inpatient rehab setting.  Physiatrist is providing close team supervision and 24 hour management of active medical problems listed below.  Physiatrist and rehab team continue to assess barriers to discharge/monitor patient  progress toward functional and medical goals   Care Tool:  Bathing    Body parts bathed by patient: Right arm,Left arm,Chest,Abdomen,Front perineal area,Right upper leg,Left upper leg,Face,Right lower leg,Left lower leg   Body parts bathed by helper: Buttocks     Bathing assist Assist Level: Minimal Assistance - Patient > 75% (standing on the stedy for washing buttocks after shower)     Upper Body Dressing/Undressing Upper body dressing   What is the patient wearing?: Pull over shirt    Upper body assist Assist Level: Supervision/Verbal cueing    Lower Body Dressing/Undressing Lower body dressing      What is the patient wearing?: Incontinence brief,Pants     Lower body assist Assist for lower body dressing: Moderate Assistance - Patient 50 - 74%     Toileting Toileting    Toileting assist Assist for toileting: Dependent - Patient 0% Assistive Device Comment: pericare after urinal   Transfers Chair/bed transfer  Transfers assist     Chair/bed transfer assist level: Dependent - mechanical lift     Locomotion Ambulation   Ambulation assist   Ambulation activity did not occur: Safety/medical concerns  Assist level: 2 helpers Assistive device: Lite Gait Max distance: 24   Walk 10 feet activity   Assist  Walk 10 feet activity did not occur: Safety/medical concerns  Assist level: 2 helpers Assistive device: Lite Gait   Walk 50 feet activity   Assist Walk 50 feet with 2 turns activity did  not occur: Safety/medical concerns         Walk 150 feet activity   Assist Walk 150 feet activity did not occur: Safety/medical concerns         Walk 10 feet on uneven surface  activity   Assist Walk 10 feet on uneven surfaces activity did not occur: Safety/medical concerns         Wheelchair     Assist Will patient use wheelchair at discharge?: Yes Type of Wheelchair: Manual    Wheelchair assist level: Supervision/Verbal cueing,Set up  assist Max wheelchair distance: 300    Wheelchair 50 feet with 2 turns activity    Assist        Assist Level: Supervision/Verbal cueing   Wheelchair 150 feet activity     Assist      Assist Level: Supervision/Verbal cueing    Medical Problem List and Plan: 1.  Bilateral lower extremity weakness with sensory deficits, decreased endurance, poor awareness/lacks insight as well as pain affecting ADLs and mobility secondary to thoracic myelopathy with paraparesis.  Continue CIR PT, OT  2/24- moved d/c date to later in March- moved out 1 week or so.   -He was upset he was not OOB on Saturday- placed daily OOB order 2.  Antithrombotics: -DVT/anticoagulation:  Pharmaceutical: Lovenox    Lower extremity Dopplers ordered  Dopplers (-)- will continue Lovenox  2/25- will need Lovenox for 2 month from date of surgery.              -antiplatelet therapy: N/a 3. Pain Management: Continue oxycodone prn.              --neuropathy BLE and right chest wall-->continue gabapentin 600 mg tid for now.   2/7- will Add Duloxetine 30 mg QHS and decrease Prozac to 20 mg daily since needs help with nerve pain and already at 600 mg TID of gabapentin  2/16- will increase Oxycodone to 15 mg q4 hours prn   2/17- pain doing better- today- explained might have difficulty giving this much pain meds when leaves, due to  Federal law for first 7 days- will see what we can do.   2/25- pain controlled- will see if pain doing better, so can try to wean Oxy prn if possible  2/27- pain stable- will d/w pt Tuesday about trying to wean meds a little 4. H/o anxiety/Mood: LCSW to follow for evaluation and support.              Prozaac  2/7- decrease to 20 mg daily so has Duloxetine  2/11- no change in affect  2/18- since on prozac, cannot increase Duloxetine  2/21- mood better last few days- con't regimen             -antipsychotic agents: Continue Zyprexa 5. Neuropsych: This patient is capable of making  decisions on his own behalf. 6. Skin/Wound Care: Routine pressure relief measures. Hypoallergenic sheets.   Prevalon boots for both heels ordered 7. Fluids/Electrolytes/Nutrition: Monitor I/Os.    8. Neurogenic bladder:   UA unremarkable, urine culture pending  2/7- will recheck U/A and Cx due to incontinence AND leukocytosis  2/8- F/U U/A is strongly (+) for UTI- started Keflex 500 mg TID- awaiting Cx- will recheck CBC in AM- wait on stopping Flomax.  2/9- U Cx (+) for Ecoli- pansensitive- WBC down to 14k- will con't Keflex 500 mg TID for a total of 7 days.    2/10- labs in AM- CBC-diff and BMP to make sure things normalizing.  2/14- increased Flomax to 0.8 mg qevening- since required cath x1-   2/25- no more bladder scans- con't regimen of Flomax 0.8 mg nightly  2/27- no caths required on higher dose of Flomax- con't regimen 9. HTN: Monitor BP   Norvasc  Mildly elevated on 2/6, monitor for trend  2/28-systolic elevated last 2 reads but has mostly been stable-continue Norvasc             Monitor with increased mobility 10. Neurogenic bowel:    KUB reviewed, unremarkable             Miralax daily and dulcolax supp w/dig stim after supper.   2/9- refused all bowel program x5+ days- explained to pt why he needs it- should be able to avoid "2 days of stools", but the more he waits, the more chance he will have  A 1-2 day "blowout", which will be due to not emptying gut. He said he would try it.  2/10- refused bowel program again. Incontinent- doesn't understand can't control bowels.  2/13- pt continues to refuse bowel program. Had a frank conversation as to why we're doing it. Pt seemed to listen/understand. We'll see how it goes tonight  2/17- discussed bowel program and how it's to help reduce accidents over 3-6 weeks, not instantly- explained to pt/wife- can also order dig stim stick and suppository inserter as well- explained they are on [place] if wife wants to get them. Con't bowel program-  had bowel accident last night- no BM with program.  2/21- Tolerating bowel program nightly- still having accidents, it's in the first week, so expected.  2/23- wasn't doing bowel program- restarted Suppository but refuses dig stim  2/25- still has accidents- but expected without dig stim- is having regular BMs   2/27- pt isn't concerned about bowel accidents- won't do dig stim- con't suppository nightly 11.  Hypoalbuminemia  Supplement initiated on 2/5 12.  Transaminitis  Resolved 13.  Leukocytosis  WBC 12.1 on 2/5, labs ordered for tomorrow  2/7- WBC 17.7!- will check U/A and Cx again AND CXR since WBC up so much- not on steroids. Afebrile and says he doesn't feel ill.   2/8- has UTI- denied Sx's of UTI, however has incontinence- Keflex 500 mg TID- will wait for Cx results.   2/9- Cx shows ECOLI- PAN-SENSITIVE- CON'T Keflex  2/11- Keflex x total of 7 days-   2/15- WBC 8.2- resolved 14. Anemia: Hgb reviewed and low at 11.9, but improved from last read 15. Dispo  2/17- family conference today- spent 30 minutes in discussions about medical issues- as detailed about- explained to pt I know he gets scared when confronting something new- explained in stead of hearing I can't- lets try "i'm going to try".   2/25- walked 75 ft with RW- pt very excited about improvement   LOS: 24 days A FACE TO [place] 08/21/2020, 8:29 AM

## 2020-08-21 NOTE — Progress Notes (Signed)
Occupational Therapy Session Note  Patient Details  Name: Randall Mccormick MRN: [id number] Date of Birth: 05/21/1956  Today's Date: 08/21/2020 OT Individual Time: 0918-1014 OT Individual Time Calculation (min): 56 min    Short Term Goals: Week 3:  OT Short Term Goal 2 (Week 3): Pt will complete BSC transfer with LRAD and 2 assist OT Short Term Goal 2 - Progress (Week 3): Not met OT Short Term Goal 3 (Week 3): Pt will complete LB dressing with mod A using AE PRN OT Short Term Goal 3 - Progress (Week 3): Not met OT Short Term Goal 4 (Week 3): Pt will complete toileting tasks with max A OT Short Term Goal 4 - Progress (Week 3): Not met Week 4:  OT Short Term Goal 1 (Week 4): Pt will complete LB dressing with mod A using AE PRN OT Short Term Goal 2 (Week 4): Pt will complete toileting tasks with max A OT Short Term Goal 3 (Week 4): Pt will complete sliding board transfers to the drop arm commode with mod assist.  Skilled Therapeutic Interventions/Progress Updates:    Pt greeted at time of session sitting up in wheelchair agreeable to OT session, stating he already got dressed and did not want to shower, wants to shower tomorrow. Attempted to use urinal at beginning of session but unable to void. Self propel > gym and performed SCIFIT on level 5 for 7 mins forward and 3 backward to improve global endurance and UB strength. Self propel > room to work on Stedy standing and LE strength for carryover to ADL transfers. Sit> stand at stedy consistently Min/CGA and able to stand for the following: 1:50, :27, :40, and :57 with tactile cues at L quad for engagement and inconsistent buckling of both knees, cues for posture and bringing buttocks forward. Pt had questions about home showering, has a walk in shower and tub but states walker will not fit into either bathroom. Pt transported > ADL apartment and demonstrated TTB transfer for potential use at home, pt verbalized understanding and is interested. Back in  room alarm on call bell in reach.   Therapy Documentation Precautions:  Precautions Precautions: Back,Fall,Other (comment) Precaution Booklet Issued: No Precaution Comments: paraparesis Required Braces or Orthoses: Spinal Brace Spinal Brace: Other (comment) Spinal Brace Comments: Figure 8 Brace Restrictions Weight Bearing Restrictions: No     Therapy/Group: Individual Therapy   C  08/21/2020, 8:49 AM 

## 2020-08-22 DIAGNOSIS — M5104 Intervertebral disc disorders with myelopathy, thoracic region: Secondary | ICD-10-CM | POA: Diagnosis not present

## 2020-08-22 NOTE — Progress Notes (Signed)
Occupational Therapy Session Note  Patient Details  Name: Randall Mccormick MRN: [id number] Date of Birth: August 30, 1955  Today's Date: 08/22/2020 OT Individual Time: 0762-2633 OT Individual Time Calculation (min): 57 min    Short Term Goals: Week 3:  OT Short Term Goal 2 (Week 3): Pt will complete BSC transfer with LRAD and 2 assist OT Short Term Goal 2 - Progress (Week 3): Not met OT Short Term Goal 3 (Week 3): Pt will complete LB dressing with mod A using AE PRN OT Short Term Goal 3 - Progress (Week 3): Not met OT Short Term Goal 4 (Week 3): Pt will complete toileting tasks with max A OT Short Term Goal 4 - Progress (Week 3): Not met Week 4:  OT Short Term Goal 1 (Week 4): Pt will complete LB dressing with mod A using AE PRN OT Short Term Goal 2 (Week 4): Pt will complete toileting tasks with max A OT Short Term Goal 3 (Week 4): Pt will complete sliding board transfers to the drop arm commode with mod assist.   Skilled Therapeutic Interventions/Progress Updates:    Pt greeted at time of session sitting on commode with NT present in room, OT to resume care. Pt wanting to shower, could not have BM initially. Stedy transfer Min/CGA to stand to shower, performed bathing Min A overall with assist to wash buttocks via BSC hole and thoroughly wash feet after LHS. After drying off Min A, Stedy > toilet as pt needed to have BM, continent void on BSC over top of toilet. Total A for clothing management and hygiene after BM with pt standing in [place], attempted to have pt try to assist with hygiene but stating he could not perform. LB dress Stedy level Max-total A for brief and shorts. UB dress Supervision. Stedy > wheelchair and set up at sink for oral hygiene. Self propel > gym and performed SCIFIT seated for 8 minutes on level 5 for UB strength and global endurance. Back in room alarm on call bell in reach.    Therapy Documentation Precautions:  Precautions Precautions: Back,Fall,Other  (comment) Precaution Booklet Issued: No Precaution Comments: paraparesis Required Braces or Orthoses: Spinal Brace Spinal Brace: Other (comment) Spinal Brace Comments: Figure 8 Brace Restrictions Weight Bearing Restrictions: No     Therapy/Group: Individual Therapy  Viona Gilmore 08/22/2020, 10:33 AM

## 2020-08-22 NOTE — Progress Notes (Signed)
Patient ID: Randall Mccormick, male   DOB: 01/16/1956, 64 y.o.   MRN: [id number]  SW met with pt and pt wife in room to provide updates on gains made in rehab, and d/c date remains the same. SW discussed HHA preference. Pt prefers [facility] (p: [phone]/f:[phone]). SW informed will provide other updates on d/c recommendations.   *SW spoke with [facility] to follow-up if able to accept pt insurance, reports does not accept [employer] insurance plan. SW informed pt and pt wife on above, and provided with HHA list via Medicare.gov. SW informed if able to find out if pt has a different plan, will be sure to follow-up with them.   Further investigation shows pt has [employer] and not just Medicare A/B.   , MSW, LCSWA Office: [phone] Cell: [phone] Fax: [phone] 

## 2020-08-22 NOTE — Progress Notes (Signed)
[place] PHYSICAL MEDICINE & REHABILITATION PROGRESS NOTE  Subjective/Complaints:  Pt reports didn't get up this weekend- asked to, but wasn't up.   Taking suppository at night and miralax in AM. Bowels working "ok".   Working on "turns" with RW with therapy.   getting w/c eval tomorrow  ROS:   Pt denies SOB, abd pain, CP, N/V/C/D, and vision changes   Objective: Vital Signs: Blood pressure (!) 152/79, pulse 89, temperature 97.7 F (36.5 C), temperature source Oral, resp. rate 19, height 5\' 8"  (1.727 m), weight 103.6 kg, SpO2 96 %. No results found. Recent Labs    08/21/20 0519  WBC 7.2  HGB 11.9*  HCT 38.8*  PLT 297   Recent Labs    08/21/20 0519  NA 140  K 4.1  CL 103  CO2 30  GLUCOSE 98  BUN 12  CREATININE 0.84  CALCIUM 9.1    Intake/Output Summary (Last 24 hours) at 08/22/2020 0919 Last data filed at 08/22/2020 0841 Gross per 24 hour  Intake 960 ml  Output 1300 ml  Net -340 ml        Physical Exam: BP (!) 152/79 (BP Location: Left Arm)   Pulse 89   Temp 97.7 F (36.5 C) (Oral)   Resp 19   Ht 5\' 8"  (1.727 m)   Wt 103.6 kg   SpO2 96%   BMI 34.73 kg/m    General: awake, alert, appropriate, NAD- sitting upl finished breakfast HENT: conjugate gaze; oropharynx moist CV: regular rate; no JVD Pulmonary: CTA B/L; no W/R/R- good air movement GI: soft, NT, ND, (+)BS Psychiatric: appropriate Neurological: Ox3 Skin: intact Motor: Bilateral upper extremities: 5/5  Right lower extremity: Hip flexion, knee extension 2+/5, ankle dorsiflexion 4-/5, no change Left lower extremity: Hip flexion, knee extension 2/5, ankle dorsiflexion 4 -/5, no change  Sensation diminished sl to light touch bilateral lower extremities    Assessment/Plan: 1. Functional deficits which require 3+ hours per day of interdisciplinary therapy in a comprehensive inpatient rehab setting.  Physiatrist is providing close team supervision and 24 hour management of active medical  problems listed below.  Physiatrist and rehab team continue to assess barriers to discharge/monitor patient progress toward functional and medical goals   Care Tool:  Bathing    Body parts bathed by patient: Right arm,Left arm,Chest,Abdomen,Front perineal area,Right upper leg,Left upper leg,Face,Right lower leg,Left lower leg   Body parts bathed by helper: Buttocks     Bathing assist Assist Level: Minimal Assistance - Patient > 75% (standing on the stedy for washing buttocks after shower)     Upper Body Dressing/Undressing Upper body dressing   What is the patient wearing?: Pull over shirt    Upper body assist Assist Level: Supervision/Verbal cueing    Lower Body Dressing/Undressing Lower body dressing      What is the patient wearing?: Incontinence brief,Pants     Lower body assist Assist for lower body dressing: Moderate Assistance - Patient 50 - 74%     Toileting Toileting    Toileting assist Assist for toileting: Dependent - Patient 0% Assistive Device Comment: pericare after urinal   Transfers Chair/bed transfer  Transfers assist     Chair/bed transfer assist level: Moderate Assistance - Patient 50 - 74%     Locomotion Ambulation   Ambulation assist   Ambulation activity did not occur: Safety/medical concerns  Assist level: 2 helpers Assistive device: Lite Gait Max distance: 24   Walk 10 feet activity   Assist  Walk 10 feet  activity did not occur: Safety/medical concerns  Assist level: 2 helpers Assistive device: Lite Gait   Walk 50 feet activity   Assist Walk 50 feet with 2 turns activity did not occur: Safety/medical concerns         Walk 150 feet activity   Assist Walk 150 feet activity did not occur: Safety/medical concerns         Walk 10 feet on uneven surface  activity   Assist Walk 10 feet on uneven surfaces activity did not occur: Safety/medical concerns         Wheelchair     Assist Will patient use  wheelchair at discharge?: Yes Type of Wheelchair: Manual    Wheelchair assist level: Supervision/Verbal cueing,Set up assist Max wheelchair distance: 300    Wheelchair 50 feet with 2 turns activity    Assist        Assist Level: Supervision/Verbal cueing   Wheelchair 150 feet activity     Assist      Assist Level: Supervision/Verbal cueing    Medical Problem List and Plan: 1.  Bilateral lower extremity weakness with sensory deficits, decreased endurance, poor awareness/lacks insight as well as pain affecting ADLs and mobility secondary to thoracic myelopathy with paraparesis.  Continue CIR PT, OT  2/24- moved d/c date to later in March- moved out 1 week or so.   -He was upset he was not OOB on Saturday- placed daily OOB order 2.  Antithrombotics: -DVT/anticoagulation:  Pharmaceutical: Lovenox    Lower extremity Dopplers ordered  Dopplers (-)- will continue Lovenox  3/1- will need Lovenox for 2 months total from surgery- will order Lovenox teaching.              -antiplatelet therapy: N/a 3. Pain Management: Continue oxycodone prn.              --neuropathy BLE and right chest wall-->continue gabapentin 600 mg tid for now.   2/7- will Add Duloxetine 30 mg QHS and decrease Prozac to 20 mg daily since needs help with nerve pain and already at 600 mg TID of gabapentin  2/16- will increase Oxycodone to 15 mg q4 hours prn   2/17- pain doing better- today- explained might have difficulty giving this much pain meds when leaves, due to  [employer] for first 7 days- will see what we can do.   2/25- pain controlled- will see if pain doing better, so can try to wean Oxy prn if possible  2/27- pain stable- will d/w pt Tuesday about trying to wean meds a little  3/1- pt doesn't want to wean meds as of yet- will d/w pt Thursday 4. H/o anxiety/Mood: LCSW to follow for evaluation and support.              Prozaac  2/7- decrease to 20 mg daily so has Duloxetine  2/11- no change in  affect  2/18- since on prozac, cannot increase Duloxetine  2/21- mood better last few days- con't regimen             -antipsychotic agents: Continue Zyprexa 5. Neuropsych: This patient is capable of making decisions on his own behalf. 6. Skin/Wound Care: Routine pressure relief measures. Hypoallergenic sheets.   Prevalon boots for both heels ordered 7. Fluids/Electrolytes/Nutrition: Monitor I/Os.    8. Neurogenic bladder:   UA unremarkable, urine culture pending  2/7- will recheck U/A and Cx due to incontinence AND leukocytosis  2/8- F/U U/A is strongly (+) for UTI- started Keflex 500 mg TID-  awaiting Cx- will recheck CBC in AM- wait on stopping Flomax.  2/9- U Cx (+) for Ecoli- pansensitive- WBC down to 14k- will con't Keflex 500 mg TID for a total of 7 days.    2/10- labs in AM- CBC-diff and BMP to make sure things normalizing.  2/14- increased Flomax to 0.8 mg qevening- since required cath x1-   3/1- no caths- voiding OK- con't Flomax 0.8 mg nightly 9. HTN: Monitor BP   Norvasc  Mildly elevated on 2/6, monitor for trend  2/28-systolic elevated last 2 reads but has mostly been stable-continue Norvasc  3/1- BP 150s/80s- this is unusual for pt- if no improvement in next 1-2 days, will increase Meds- will verify no orthostatic hypotension with walking.              Monitor with increased mobility 10. Neurogenic bowel:    KUB reviewed, unremarkable             Miralax daily and dulcolax supp w/dig stim after supper.   2/9- refused all bowel program x5+ days- explained to pt why he needs it- should be able to avoid "2 days of stools", but the more he waits, the more chance he will have  A 1-2 day "blowout", which will be due to not emptying gut. He said he would try it.  2/10- refused bowel program again. Incontinent- doesn't understand can't control bowels.  2/13- pt continues to refuse bowel program. Had a frank conversation as to why we're doing it. Pt seemed to listen/understand. We'll  see how it goes tonight  2/17- discussed bowel program and how it's to help reduce accidents over 3-6 weeks, not instantly- explained to pt/wife- can also order dig stim stick and suppository inserter as well- explained they are on [place] if wife wants to get them. Con't bowel program- had bowel accident last night- no BM with program.  2/27- pt isn't concerned about bowel accidents- won't do dig stim- con't suppository nightly  3/1- pt doing miralax and Suppository daily- still having some accidents per pt- con't regimen 11.  Hypoalbuminemia  Supplement initiated on 2/5 12.  Transaminitis  Resolved 13.  Leukocytosis  WBC 12.1 on 2/5, labs ordered for tomorrow  2/7- WBC 17.7!- will check U/A and Cx again AND CXR since WBC up so much- not on steroids. Afebrile and says he doesn't feel ill.   2/8- has UTI- denied Sx's of UTI, however has incontinence- Keflex 500 mg TID- will wait for Cx results.   2/9- Cx shows ECOLI- PAN-SENSITIVE- CON'T Keflex  2/11- Keflex x total of 7 days-   2/15- WBC 8.2- resolved 14. Anemia: Hgb reviewed and low at 11.9, but improved from last read 15. Dispo  2/17- family conference today- spent 30 minutes in discussions about medical issues- as detailed about- explained to pt I know he gets scared when confronting something new- explained in stead of hearing I can't- lets try "i'm going to try".   2/25- walked 43 ft with RW- pt very excited about improvement  3/1- pt working on turning to sides with RW and tolerating/balancing- con't therapy. W/C evaluation tomorrow- will see who's doing.    LOS: 25 days A FACE TO FACE EVALUATION WAS PERFORMED  Megan Lovorn 08/22/2020, 9:19 AM

## 2020-08-22 NOTE — Patient Care Conference (Signed)
Inpatient RehabilitationTeam Conference and Plan of Care Update Date: 08/22/2020   Time: 11:09 AM    Patient Name: Randall Mccormick      Medical Record Number: [id number]  Date of Birth: 1956-03-07 Sex: Male         Room/Bed: 4M04C/4M04C-01 Payor Info: Payor: MEDICARE RAILROAD / Plan: MEDICARE RAILROAD / Product Type: *No Product type* /    Admit Date/Time:  07/28/2020  2:39 PM  Primary Diagnosis:  Thoracic disc disease with myelopathy  Hospital Problems: Principal Problem:   Thoracic disc disease with myelopathy Active Problems:   Constipation   Neurogenic bowel   Neurogenic bladder   Neuropathic pain   Chronic pain syndrome   Leukocytosis   Transaminitis   Hypoalbuminemia due to protein-calorie malnutrition [facility])    Expected Discharge Date: Expected Discharge Date: 09/01/20  Team Members Present: Physician leading conference: Dr. Courtney Heys Care Coodinator Present: Loralee Pacas, LCSWA;Amberleigh Gerken Creig Hines, RN, BSN, [place] Nurse Present: Other (comment) Philip Aspen, RN) PT Present: Excell Seltzer, PT OT Present: Lillia Corporal, OT PPS Coordinator present : Gunnar Fusi, SLP     Current Status/Progress Goal Weekly Team Focus  Bowel/Bladder   Infrequent incontinence of bowel and bladder noted. Urgency of bladder. Large loose BM 2/26. Refuses bowel program.  Will remain continent and regular of bowel.  Assess toileting needs with every round.   Swallow/Nutrition/ Hydration             ADL's   Bathing shower level Min, Stedy transfers, UB ADLs S, LB dress at stedy Max A, frequent incontinence of urine  min/mod A overall  bed mobility for ADLs, sit <> stands, standing balance/tolerance, BADL training, transfers, core strength, sitting balance   Mobility   Bed mobility supervision, STS min A with RW, stand pivot transfer with RW min-mod A, slideboard transfer with min A, wc mobility supervision x 300 ft, gait with RW and maxi sky x 12 ft  min A overall, likely w/c level (will  need a ramp)  Progressing gait, transfers, strength, endurance   Communication             Safety/Cognition/ Behavioral Observations            Pain   C/o back pain from 1-8 out of 10. Current meds needed regulaly but effecetive.  Pain level < 5.  Assess pain q shift and PRN.   Skin   No skin issues.  Skin will remain free breakdown.  Assess skin every shift.     Discharge Planning:  D/c to home with 24/7 care from pt wife. Pt will need to work on being able to perform self-care needs and becoming more self-sufficient. Family meeting held on 2/17 addressing pt care needs, and pt discharging at w/c level.   Team Discussion: Receiving suppository every night per patient, need nursing documentation. Patient and caregiver need Lovenox education and hands on training. BP running high, making sure patient is not ortho static. Continent of bowel x2 today, complaints of pain today, given Oxy IR. Patient on target to meet rehab goals: Progressing well, bed mobility is min assist. Ramp is being built. WC eval is today. Stand pivot +2 assist for safety, has trouble with left foot. Knees tend to give out. Ambulated a total of 56ft last week, while in the Lite gait, with the RW. Showering at the shower level, using AD equipment. Using a stedy for transfers. Wants to be able to shower at home but in all likelihood that may not be possible.   *  See Care Plan and progress notes for long and short-term goals.   Revisions to Treatment Plan:  Not at this time.  Teaching Needs: Family education, medication management, skin/wound care, pain management, Lovenox education, bowel and bladder training, transfer training, gait training, endurance training, weight bearing precautions.  Current Barriers to Discharge: Inaccessible home environment, Decreased caregiver support, Home enviroment access/layout, Incontinence, Neurogenic bowel and bladder, Wound care, Lack of/limited family support, Weight, Weight bearing  restrictions, Medication compliance and Behavior  Possible Resolutions to Barriers: Continue current medications, provide emotional support.     Medical Summary Current Status: LBM this AM- on toilet; suppository at night; pain controlled with oxycodone/Long acting pain meds; occ incontinent of urine;  Barriers to Discharge: Decreased family/caregiver support;Home enviroment access/layout;Medication compliance;Weight bearing restrictions;Weight;Neurogenic Bowel & Bladder;Other (comments)  Barriers to Discharge Comments: working on ramp at home; w/c eval today. acute on chronic pain; Possible Resolutions to [employer]: new weakness or fatigue this week not sure what the cause is per PT/MD; and knee pain/new/worsening; walking with lite gait short distances; ' con't owel program; try to reduce pain meds; BP a little high- no orthostasis- d/c 09/01/20   Continued Need for Acute Rehabilitation Level of Care: The patient requires daily medical management by a physician with specialized training in physical medicine and rehabilitation for the following reasons: Direction of a multidisciplinary physical rehabilitation program to maximize functional independence : Yes Medical management of patient stability for increased activity during participation in an intensive rehabilitation regime.: Yes Analysis of laboratory values and/or radiology reports with any subsequent need for medication adjustment and/or medical intervention. : Yes   I attest that I was present, lead the team conference, and concur with the assessment and plan of the team.   Cristi Loron 08/22/2020, 4:56 PM

## 2020-08-22 NOTE — Progress Notes (Signed)
Physical Therapy Session Note  Patient Details  Name: Randall Mccormick MRN: [id number] Date of Birth: 1956/01/27  Today's Date: 08/22/2020 PT Individual Time:1400-1500, 8115-7262 PT Individual Time Calculation (min): 60 min, 35 min   Short Term Goals: Week 3:  PT Short Term Goal 1 (Week 3): Pt will perform least restrictive transfer with mod A consistently. PT Short Term Goal 2 (Week 3): Pt will tolerate standing x 5 min with LRAD. PT Short Term Goal 3 (Week 3): Pt will propel wc x 300 ft with supervision.  Skilled Therapeutic Interventions/Progress Updates:    Session 1: Pt seated in wc on arrival with wife, Randall Mccormick, present. Session focused on wc evaluation with [place] for specialty wc and pt/family education. Pt agreeable to ordering rigid frame custom chair for ease of transport and improved propulsion efficiency. Pt will receive loaner chair from [place] before d/c. Pt performed stand pivot transfer with min A x 2 for safety wc<> EOB. On standing, pt had episode of bowel incontinence. Stedy transfer to commode, total assist for hygiene and clothing management while standing with stedy. After returning to EOB with stedy, pt had another episode of bowel incontinence. Brief change performed in standing with stedy, total assist for hygiene and clothing management. After seeing both episodes, pt's wife states that she might be willing to perform bowel program at home. Pt and wife educated on importance of  bowel program, what it would entail, and alternative methods of performing digital stimulation. Pt's wife seemed willing, pt continues to display apprehension. Pt returned to bed after session and was left with all needs in reach and bed alarm active.   Session 2: Pt resting in bed on arrival, requesting brief change due to incontinence. Brief change performed in standing with stedy, total assist for hygiene and clothing management. Stedy transfer to wc for time and energy  conservation. Pt propelled wc 2 x 75 ft with BUE and supervision before becoming fatigued. States that he has not been feeling well the past few days. Stand pivot transfer wc<>ADL apartment bed with min A x 2 for safety. Pt required 3" step to scoot back and mod A sit>supine for LE management. Supine>sit supervision with simulated bed rail and VCs for technique to maintain spinal precautions. Pt returned to room after session, Brief change performed in standing with stedy, total assist for hygiene and clothing management. Pt remained seated in wc with quick release belt and all needs in place.   Therapy Documentation Precautions:  Precautions Precautions: Back,Fall,Other (comment) Precaution Booklet Issued: No Precaution Comments: paraparesis Required Braces or Orthoses: Spinal Brace Spinal Brace: Other (comment) Spinal Brace Comments: Figure 8 Brace Restrictions Weight Bearing Restrictions: No General: PT Amount of Missed Time (min): 25 Minutes PT Missed Treatment Reason: Bowel/bladder accident;Patient fatigue Vital Signs: Therapy Vitals Pulse Rate: 87 Resp: 18 BP: 132/73 Patient Position (if appropriate): Lying Oxygen Therapy SpO2: 96 % O2 Device: Room Air Pain:   Mobility:   Locomotion :    Trunk/Postural Assessment :    Balance:   Exercises:   Other Treatments:      Therapy/Group: Individual Therapy  Sharen Counter, SPT 08/22/2020, 5:32 PM

## 2020-08-23 DIAGNOSIS — G894 Chronic pain syndrome: Secondary | ICD-10-CM | POA: Diagnosis not present

## 2020-08-23 DIAGNOSIS — M5104 Intervertebral disc disorders with myelopathy, thoracic region: Secondary | ICD-10-CM | POA: Diagnosis not present

## 2020-08-23 NOTE — Progress Notes (Signed)
[place] PHYSICAL MEDICINE & REHABILITATION PROGRESS NOTE  Subjective/Complaints:   Pt says feels "blah" today- no nausea, no illness, just doesn't feel "good".   Got w/c evaluation done yesterday- did OK.   ROS:   Pt denies SOB, abd pain, CP, N/V/C/D, and vision changes   Objective: Vital Signs: Blood pressure 122/86, pulse 73, temperature 97.7 F (36.5 C), temperature source Oral, resp. rate 16, height 5\' 8"  (1.727 m), weight 103.6 kg, SpO2 97 %. No results found. Recent Labs    08/21/20 0519  WBC 7.2  HGB 11.9*  HCT 38.8*  PLT 297   Recent Labs    08/21/20 0519  NA 140  K 4.1  CL 103  CO2 30  GLUCOSE 98  BUN 12  CREATININE 0.84  CALCIUM 9.1    Intake/Output Summary (Last 24 hours) at 08/23/2020 1042 Last data filed at 08/23/2020 0700 Gross per 24 hour  Intake 320 ml  Output 1180 ml  Net -860 ml        Physical Exam: BP 122/86   Pulse 73   Temp 97.7 F (36.5 C) (Oral)   Resp 16   Ht 5\' 8"  (1.727 m)   Wt 103.6 kg   SpO2 97%   BMI 34.73 kg/m     General: awake, alert, appropriate, NAD- laying supine in bed; just woke up HENT: conjugate gaze; oropharynx moist CV: regular rate; no JVD Pulmonary: CTA B/L; no W/R/R- good air movement GI: soft, NT, ND, (+)BS Psychiatric: appropriate, but appears a little more flat today Neurological: Ox3 Skin: intact Motor: Bilateral upper extremities: 5/5  Right lower extremity: Hip flexion, knee extension 2+/5, ankle dorsiflexion 4-/5, no change Left lower extremity: Hip flexion, knee extension 2/5, ankle dorsiflexion 4 -/5, no change  Sensation diminished sl to light touch bilateral lower extremities    Assessment/Plan: 1. Functional deficits which require 3+ hours per day of interdisciplinary therapy in a comprehensive inpatient rehab setting.  Physiatrist is providing close team supervision and 24 hour management of active medical problems listed below.  Physiatrist and rehab team continue to assess  barriers to discharge/monitor patient progress toward functional and medical goals   Care Tool:  Bathing    Body parts bathed by patient: Right arm,Left arm,Chest,Abdomen,Front perineal area,Right upper leg,Left upper leg,Face,Right lower leg,Left lower leg   Body parts bathed by helper: Buttocks,Right lower leg,Left lower leg     Bathing assist Assist Level: Minimal Assistance - Patient > 75% (therapist washed buttocks via BSC hole, assist for thoroughly washing feet after LHS)     Upper Body Dressing/Undressing Upper body dressing   What is the patient wearing?: Pull over shirt    Upper body assist Assist Level: Supervision/Verbal cueing    Lower Body Dressing/Undressing Lower body dressing      What is the patient wearing?: Incontinence brief,Pants     Lower body assist Assist for lower body dressing: Moderate Assistance - Patient 50 - 74%     Toileting Toileting    Toileting assist Assist for toileting: Total Assistance - Patient < 25% Assistive Device Comment: pericare after urinal   Transfers Chair/bed transfer  Transfers assist     Chair/bed transfer assist level: Moderate Assistance - Patient 50 - 74%     Locomotion Ambulation   Ambulation assist   Ambulation activity did not occur: Safety/medical concerns  Assist level: 2 helpers Assistive device: Lite Gait Max distance: 24   Walk 10 feet activity   Assist  Walk 10 feet activity  did not occur: Safety/medical concerns  Assist level: 2 helpers Assistive device: Lite Gait   Walk 50 feet activity   Assist Walk 50 feet with 2 turns activity did not occur: Safety/medical concerns         Walk 150 feet activity   Assist Walk 150 feet activity did not occur: Safety/medical concerns         Walk 10 feet on uneven surface  activity   Assist Walk 10 feet on uneven surfaces activity did not occur: Safety/medical concerns         Wheelchair     Assist Will patient use  wheelchair at discharge?: Yes Type of Wheelchair: Manual    Wheelchair assist level: Supervision/Verbal cueing,Set up assist Max wheelchair distance: 300    Wheelchair 50 feet with 2 turns activity    Assist        Assist Level: Supervision/Verbal cueing   Wheelchair 150 feet activity     Assist      Assist Level: Supervision/Verbal cueing    Medical Problem List and Plan: 1.  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  Continue CIR PT, OT  2/24- moved d/c date to later in March- moved out 1 week or so.   -He was upset he was not OOB on Saturday- placed daily OOB order 2.  Antithrombotics: -DVT/anticoagulation:  Pharmaceutical: Lovenox    Lower extremity Dopplers ordered  Dopplers (-)- will continue Lovenox  3/1- will need Lovenox for 2 months total from surgery- will order Lovenox teaching.              -antiplatelet therapy: N/a 3. Pain Management: Continue oxycodone prn.              --neuropathy BLE and right chest wall-->continue gabapentin 600 mg tid for now.   2/7- will Add Duloxetine 30 mg QHS and decrease Prozac to 20 mg daily since needs help with nerve pain and already at 600 mg TID of gabapentin  2/16- will increase Oxycodone to 15 mg q4 hours prn   2/17- pain doing better- today- explained might have difficulty giving this much pain meds when leaves, due to  [employer] for first 7 days- will see what we can do.   2/25- pain controlled- will see if pain doing better, so can try to wean Oxy prn if possible  2/27- pain stable- will d/w pt Tuesday about trying to wean meds a little  3/1- pt doesn't want to wean meds as of yet- will d/w pt Thursday 4. H/o anxiety/Mood: LCSW to follow for evaluation and support.              Prozaac  2/7- decrease to 20 mg  daily so has Duloxetine  2/11- no change in affect  2/18- since on prozac, cannot increase Duloxetine  2/21- mood better last few days- con't regimen             -antipsychotic agents: Continue Zyprexa 5. Neuropsych: This patient is capable of making decisions on his own behalf. 6. Skin/Wound Care: Routine pressure relief measures. Hypoallergenic sheets.   Prevalon boots for both heels ordered 7. Fluids/Electrolytes/Nutrition: Monitor I/Os.    8. Neurogenic bladder:   UA unremarkable, urine culture pending  2/7- will recheck U/A and Cx due to incontinence AND leukocytosis  2/8- F/U U/A is strongly (+) for UTI- started  Keflex 500 mg TID- awaiting Cx- will recheck CBC in AM- wait on stopping Flomax.  2/9- U Cx (+) for Ecoli- pansensitive- WBC down to 14k- will con't Keflex 500 mg TID for a total of 7 days.    2/10- labs in AM- CBC-diff and BMP to make sure things normalizing.  2/14- increased Flomax to 0.8 mg qevening- since required cath x1-   3/1- no caths- voiding OK- con't Flomax 0.8 mg nightly  3/2- stop Keflex for UTI 9. HTN: Monitor BP   Norvasc  Mildly elevated on 2/6, monitor for trend  2/28-systolic elevated last 2 reads but has mostly been stable-continue Norvasc  3/1- BP 150s/80s- this is unusual for pt- if no improvement in next 1-2 days, will increase Meds- will verify no orthostatic hypotension with walking.   3/2- BP much better today 122/86- won't make any BP med changes- no orthostasis per PT/OT with therapy.              Monitor with increased mobility 10. Neurogenic bowel:    KUB reviewed, unremarkable             Miralax daily and dulcolax supp w/dig stim after supper.   2/9- refused all bowel program x5+ days- explained to pt why he needs it- should be able to avoid "2 days of stools", but the more he waits, the more chance he will have  A 1-2 day "blowout", which will be due to not emptying gut. He said he would try it.  2/10- refused bowel program again.  Incontinent- doesn't understand can't control bowels.  2/13- pt continues to refuse bowel program. Had a frank conversation as to why we're doing it. Pt seemed to listen/understand. We'll see how it goes tonight  2/17- discussed bowel program and how it's to help reduce accidents over 3-6 weeks, not instantly- explained to pt/wife- can also order dig stim stick and suppository inserter as well- explained they are on [place] if wife wants to get them. Con't bowel program- had bowel accident last night- no BM with program.  2/27- pt isn't concerned about bowel accidents- won't do dig stim- con't suppository nightly  3/1- pt doing miralax and Suppository daily- still having some accidents per pt- con't regimen 11.  Hypoalbuminemia  Supplement initiated on 2/5 12.  Transaminitis  Resolved 13.  Leukocytosis  WBC 12.1 on 2/5, labs ordered for tomorrow  2/7- WBC 17.7!- will check U/A and Cx again AND CXR since WBC up so much- not on steroids. Afebrile and says he doesn't feel ill.   2/8- has UTI- denied Sx's of UTI, however has incontinence- Keflex 500 mg TID- will wait for Cx results.   2/9- Cx shows ECOLI- PAN-SENSITIVE- CON'T Keflex  2/11- Keflex x total of 7 days-   2/15- WBC 8.2- resolved  3/2- stop Keflex 14. Anemia: Hgb reviewed and low at 11.9, but improved from last read 15. Dispo  2/17- family conference today- spent 30 minutes in discussions about medical issues- as detailed about- explained to pt I know he gets scared when confronting something new- explained in stead of hearing I can't- lets try "i'm going to try".   2/25- walked 43 ft with RW- pt very excited about improvement  3/1- pt working on turning to sides with RW and tolerating/balancing- con't therapy. W/C evaluation tomorrow- will see who's doing.   Pt meets criteria for an ultra light manual w/c and pressure relieving w/c cushion due to incomplete paraplegia- he is not walking at  this time, and not sure will be able to in the  future-  he needs it to get from 1 place to another- propel w/c- cannot use heavier w/c, because he will need for 100% of propulsion and wife cannot push him; also will make transfers and pressure relief easier to use this type of w/c, which will allow pt to stay pressure ulcer free- hopefully, if does his pressure relief. Absolutely essential that he receive an ultra light weight manual w/c.   Spent 25 minutes on total care today- >50% coordination of care- speaking with PT and w/c rep about w/c needs.     LOS: 26 days A FACE TO FACE EVALUATION WAS PERFORMED  Randall Mccormick 08/23/2020, 10:42 AM

## 2020-08-23 NOTE — Progress Notes (Signed)
Physical Therapy Weekly Progress Note  Patient Details  Name: Randall Mccormick MRN: [id number] Date of Birth: [birth year]  Beginning of progress report period: August 15, 2020 End of progress report period: August 23, 2020  Today's Date: 08/23/2020 PT Individual Time: 0906-1000, 1630-1700 PT Individual Time Calculation (min): 54 min, 30 min  Patient has met 2 of 3 short term goals.  Bed mobility supervision with bed features, trialed bed mobility in ADL apartment, supervision with simulated bed rail. STS with RW CGA, CGA with stedy. Stand pivot transfers with RW and min A x 2 consistently. Gait x 12 ft with maxi sky and RW with min A and wc follow. Wc mobility up to 300 ft with supervision. Pt completed custom wc eval this week, received loaner chair from [employer].  Patient continues to demonstrate the following deficits muscle weakness, decreased cardiorespiratoy endurance and decreased balance strategies and therefore will continue to benefit from skilled PT intervention to increase functional independence with mobility.  Patient progressing toward long term goals..  Continue plan of care.  PT Short Term Goals Week 3:  PT Short Term Goal 1 (Week 3): Pt will perform least restrictive transfer with mod A consistently. PT Short Term Goal 1 - Progress (Week 3): Met PT Short Term Goal 2 (Week 3): Pt will tolerate standing x 5 min with LRAD. PT Short Term Goal 2 - Progress (Week 3): Not met PT Short Term Goal 3 (Week 3): Pt will propel wc x 300 ft with supervision. PT Short Term Goal 3 - Progress (Week 3): Met Week 4:  PT Short Term Goal 1 (Week 4): Pt will perform stand pivot transfer with LRAD and min A x1 consistently. PT Short Term Goal 2 (Week 4): Pt will tolerate standing x 5 min with LRAD. PT Short Term Goal 3 (Week 4): Pt will propel wc x 500 ft with supervision.  Skilled Therapeutic Interventions/Progress Updates:  AM session: Pt seated in wc on arrival and agreeable to therapy. Stedy  transfer to commode with CGA. Dependent brief change in stedy. Pt propelled wc with BUE and supervision to therapy gym. Performed seated leg exercises 2 x 20 each before standing: LAQ, marches, toe raises. Performed stand pivot transfer mat<>wc with min A x 1 towards R side. Required min A x 2 for stand pivot to left side. Performed STS x 3 from mat table with VCs for upright posture and anterior weight shift. Pt propelled wc back to room, attempted STS x 2 but stated he was too fatigued to continue. Pt remained seated in wc with all needs in reach. Pt missed 21 minutes of therapy due to fatigue.  PM session:  Pt seated in rigid frame loaner wc on arrival, agreeable to therapy. Pt self propels wc with supervision and BUE x 100 ft to gym. States new chair is easier to propel, but feet will not stay on foot plate. Contacted wc rep about correcting foot plate angle, attached theraband strap for temporary positioning.+ Pt instructed and performed stand pivot transfer to simulated car with mod A to power up and lift LEs into car, and VCs for upright posture. Pt performed mod A x 1 squat pivot back to wc. Discussed that this option might not be possible with his vehicle, but will trial next week. Pt propelled back to room for dependent brief change in stedy. Pt remained seated in wc after session with all needs in reach.   Therapy Documentation Precautions:  Precautions Precautions: Back,Fall,Other (comment) Precaution Booklet Issued:  No Precaution Comments: paraparesis Required Braces or Orthoses: Spinal Brace Spinal Brace: Other (comment) Spinal Brace Comments: Figure 8 Brace Restrictions Weight Bearing Restrictions: No General: PT Amount of Missed Time (min): 21 Minutes PT Missed Treatment Reason: Patient fatigue   Therapy/Group: Individual Therapy  Sharen Counter, SPT 08/23/2020, 5:11 PM

## 2020-08-23 NOTE — Consult Note (Signed)
Neuropsychological Consultation   Patient:   Randall Mccormick   DOB:   03-30-1956  MR Number:  [id number]  Location:  [facility] [address] [facility] [address] [place] [id number] [place] [address] Dept: [place]: [phone]           Date of Service:   08/23/2020  Start Time:   2 PM End Time:   3 PM  Provider/Observer:  Ilean Skill, Psy.D.       Clinical Neuropsychologist       Billing Code/Service: [id number]/[id number]  Chief Complaint:    Randall Mccormick is a 65 year old male with past medical history including hypertension, vitamin D deficiency, gout, arthritis, anxiety disorder, degenerative disc disease with multiple back surgeries, lower back pain with weakness status post spinal cord stimulator and multiple prior lumbar surgeries.  Last surgery was T10-pelvis decompressive fusion on 11/23/2019 performed at [facility] with CIR stay.  Patient has had multiple falls since discharge to home with with progressive weakness and bilateral lower extremity numbness from hips down.  Reports decline started around October and he again started using rolling walker.  Patient was admitted to [facility] on 07/19/2020 after fall in shower with reported incontinence.  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  Patient underwent posterior thoracic fusion with laminectomy and extension of thoracentesis to T8.  Hospital course complicated by postop bilateral lower extremity paraplegia as well as hypotension.  Patient has started having improvement in bilateral lower extremity movement on 07/13/2020 and therapy has been ongoing.  Bowel program was required.  Patient is continuing to progress with therapies and is now able to stand and use his rolling walker for pivoting and transfers.  Patient reports that his mood is better as is  seeing progressive improvements with regard to motor function.  Reason for Service:  Patient was referred for neuropsychological consultation due to coping adjustment issues and history of anxiety associated with worsening chronic pain symptoms and significant functional loss.  Below is the HPI for the current admission.  HPI:  Randall Mccormick is a 65 year old male with history of HTN, Vitamin D deficiency, gouty arthritis, anxiety d/o, DDD with multiple back surgeries, LBP with weakness s/p spinal cord stimulator and multiple lumbar surgery--last T10- pelvis decompressive fusion on 11/23/19 with [facility] CIR stay, multiple falls since d/c to home with progressive weakness and BLE numbness from hips down.  He started declining around October and had to start using his walker again. History from chart review and patient.  He was admitted to [facility] on 07/19/2020 after a fall in the shower, with reported incontinence.  Work-up revealed severe flattening of the thecal sac with severe segmental spinal stenosis from T8-T10 and epidural thickening with fibrosis/disc bulge. He underwent posterior thoracic fusion with laminectomy and extension of arthrodesis to T8.    Hospital course complicated by postop bilateral lower extremity paraplegia as well as hypotension treated with Riluzole, fluid bolus, plasma as well as pressors to Keep MAPs > 85. He started having improvement in BLE movement on 07/13/2020 and therapy has been ongoing. Bowel program augmented to manage constipation and wound VAC discontinued 02/03. He has had difficulty voiding requiring I/O caths after which condom cath was placed to help manage "incontinence".  On Riluzole for SCI-->to complete 13-day course on 02/09. Patient with resultant bilateral lower extremity weakness with sensory deficits, decreased endurance, has  poor awareness/lacks insight into deficits as well as pain affecting ADLs and mobility. CIR recommended due to functional decline.  Please see  preadmission assessment from earlier today as well.  Current Status:  Patient was alert and oriented and reported improved mood.  Patient reports that he has been extended which while he would like to get home as soon as possible is also seeing the benefit need for extended care to gain enough motor functions to allow some degree of independence in his ability to get to the bathroom independently and move around the house independently.  Behavioral Observation: Randall Mccormick  presents as a 65 y.o.-year-old Right handed Caucasian Male who appeared his stated age. his dress was Appropriate and he was Well Groomed and his manners were Appropriate to the situation.  his participation was indicative of Appropriate and Attentive behaviors.  There were physical disabilities noted.  he displayed an appropriate level of cooperation and motivation.     Interactions:    Active Appropriate and Attentive  Attention:   within normal limits and attention span and concentration were age appropriate  Memory:   within normal limits; recent and remote memory intact  Visuo-spatial:  not examined  Speech (Volume):  low  Speech:   normal; normal  Thought Process:  Coherent and Relevant  Though Content:  WNL; not suicidal and not homicidal  Orientation:   person, place, time/date and situation  Judgment:   Good  Planning:   Fair  Affect:    Appropriate  Mood:    Anxious  Insight:   Good  Intelligence:   normal  Medical History:   Past Medical History:  Diagnosis Date  . Anxiety   . Cobalamin deficiency   . Degeneration of lumbar or lumbosacral intervertebral disc   . Depression   . Gouty arthropathy, unspecified   . Hemorrhoids   . Hyperlipidemia   . Hypertension   . Multiple fractures of ribs of left side 04/20/13   FALL FROM A LADDER ON   . Vitamin D deficiency          Patient Active Problem List   Diagnosis Date Noted  . Leukocytosis   . Transaminitis   . Hypoalbuminemia due to  protein-calorie malnutrition ([place])   . 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/2020  . Constipation   . Neurogenic bowel   . Neurogenic bladder   . Neuropathic pain   . Chronic pain syndrome   . Anxiety   . Cobalamin deficiency   . Degeneration of lumbar or lumbosacral intervertebral disc   . Hemorrhoids   . Hyperlipidemia   . Hypertension   . Depression   . Gouty arthropathy, unspecified   . Vitamin D deficiency   . Multiple fractures of ribs of left side 04/20/2013     Psychiatric History:  Patient with prior psychiatric history including anxiety and depression.  With home medications including Prozac and Zyprexa.  Unclear as to the purpose of the Zyprexa prescription although may be due to anxiety versus prior hypomanic or manic episodes.  Patient has had times of worsening of anxiety symptoms with chronic pain and has had past significant psychosocial stressors including the death of her son at age 6 from leukemia.  Family Med/Psych History:  Family History  Problem Relation Age of Onset  . Cancer Mother        BREAST  . Heart disease Father   . Alcoholism Father   . Cancer Son        LEUKEMIA  Impression/DX:  Faris Coolman is a 65 year old male with past medical history including hypertension, vitamin D deficiency, gout, arthritis, anxiety disorder, degenerative disc disease with multiple back surgeries, lower back pain with weakness status post spinal cord stimulator and multiple prior lumbar surgeries.  Last surgery was T10-pelvis decompressive fusion on 11/23/2019 performed at [facility] with CIR stay.  Patient has had multiple falls since discharge to home with with progressive weakness and bilateral lower extremity numbness from hips down.  Reports decline started around October and he again started using rolling walker.  Patient was admitted to [facility] on 07/19/2020 after fall in shower with reported incontinence.  Work-up revealed severe flattening of  the thecal sac with severe segmental spinal stenosis from T8-T10 with epidural thickening and fibrosis/disc bulge.  Patient underwent posterior thoracic fusion with laminectomy and extension of thoracentesis to T8.  Hospital course complicated by postop bilateral lower extremity paraplegia as well as hypotension.  Patient has started having improvement in bilateral lower extremity movement on 07/13/2020 and therapy has been ongoing.  Bowel program was required.  Patient is continuing to progress with therapies and is now able to stand and use his rolling walker for pivoting and transfers.  Patient reports that his mood is better as is seeing progressive improvements with regard to motor function.  Patient was alert and oriented and reported improved mood.  Patient reports that he has been extended which while he would like to get home as soon as possible is also seeing the benefit need for extended care to gain enough motor functions to allow some degree of independence in his ability to get to the bathroom independently and move around the house independently.  Disposition/Plan:  Worked on coping and adjustment issues today patient making good progress.          Electronically Signed   _______________________ Ilean Skill, Psy.D. Clinical Neuropsychologist

## 2020-08-23 NOTE — Progress Notes (Signed)
Occupational Therapy Session Note  Patient Details  Name: Randall Mccormick MRN: [id number] Date of Birth: March 10, 1956  Today's Date: 08/23/2020 OT Individual Time: 1400-1500 OT Individual Time Calculation (min): 60 min    Short Term Goals: Week 4:  OT Short Term Goal 1 (Week 4): Pt will complete LB dressing with mod A using AE PRN OT Short Term Goal 2 (Week 4): Pt will complete toileting tasks with max A OT Short Term Goal 3 (Week 4): Pt will complete sliding board transfers to the drop arm commode with mod assist.  Skilled Therapeutic Interventions/Progress Updates:    Pt received supine with no c/o pain, agreeable to OT session, declining ADLs. Loaner w/c now in room for use. Pt required max A to don shorts, to both thread over legs and to pull up in standing. Discussed use of reacher for LB dressing. Pt stood with RW with mod A overall. He completed a squat pivot transfer to the w/c with min A. Discussed possible adjustments with w/c, especially foot rest, with primary PT, as pt's L foot frequently came off the footrest. Remainder of session focused on w/c management, proper propulsion technique, and general BUE strengthening/endurance for community level propulsion. He was taken outside where he practiced w/c mobility over uneven surface and various incline/decline. Mod A required for incline/decline d/t weak UE. Pt returned inside and had urgent, incontinent/continent urine void. Pt stood with the stedy for max A brief change. He was able to perform peri hygiene with min A for standing balance. CGA for sit <> stand in the stedy. Pt was left sitting in his w/c with all needs met.   Therapy Documentation Precautions:  Precautions Precautions: Back,Fall,Other (comment) Precaution Booklet Issued: No Precaution Comments: paraparesis Required Braces or Orthoses: Spinal Brace Spinal Brace: Other (comment) Spinal Brace Comments: Figure 8 Brace Restrictions Weight Bearing Restrictions:  No Therapy/Group: Individual Therapy  Curtis Sites 08/23/2020, 7:31 AM

## 2020-08-23 NOTE — Progress Notes (Signed)
Patient ID: Randall Mccormick, male   DOB: Apr 17, 1956, 65 y.o.   MRN: [id number]  SW called pt wife Randall Mccormick [phone]) to inform on will need to find anohter HHA. She intends to review list and follow-up with SW.  *SW received call from pt wife Randall Mccormick stating she spoke with [facility] and was informed they will accept pt insurance. SW spoke with Ivin Booty to follow-up about conversation and speak with intake coord. Reports she is in a meeting, but understands insurance can be accepted. SW faxed referral to [facility] ([phone]/[phone]). *SW spoke with Ellen[facility] confirming pt was accepted. Left message for pt with Randall Mccormick on above. SW left handicap placard form in room.  SW spoke with pt wife Randall Mccormick on above.   Loralee Pacas, MSW, [place] Office: [phone] Cell: [phone] Fax: [phone]

## 2020-08-24 DIAGNOSIS — M5104 Intervertebral disc disorders with myelopathy, thoracic region: Secondary | ICD-10-CM | POA: Diagnosis not present

## 2020-08-24 MED ORDER — OXYCODONE HCL 5 MG PO TABS
10.0000 mg | ORAL_TABLET | ORAL | Status: DC | PRN
  Administered 2020-08-24: 10 mg via ORAL
  Administered 2020-08-24 – 2020-08-27 (×5): 15 mg via ORAL
  Administered 2020-08-28: 10 mg via ORAL
  Administered 2020-08-28: 15 mg via ORAL
  Administered 2020-08-28 – 2020-08-29 (×2): 10 mg via ORAL
  Filled 2020-08-24 (×3): qty 3
  Filled 2020-08-24 (×2): qty 2
  Filled 2020-08-24: qty 3
  Filled 2020-08-24: qty 2
  Filled 2020-08-24: qty 3
  Filled 2020-08-24: qty 2
  Filled 2020-08-24: qty 3

## 2020-08-24 NOTE — Progress Notes (Signed)
Physical Therapy Session Note  Patient Details  Name: Randall Mccormick MRN: [id number] Date of Birth: 10-10-1955  Today's Date: 08/24/2020 PT Individual Time: 0900-1000 PT Individual Time Calculation (min): 60 min   Short Term Goals: Week 1:  PT Short Term Goal 1 (Week 1): Pt will be max A for transfers with LRAD PT Short Term Goal 1 - Progress (Week 1): Progressing toward goal PT Short Term Goal 2 (Week 1): Pt will be able to maintain sitting balance with mod A for 5 min PT Short Term Goal 2 - Progress (Week 1): Met PT Short Term Goal 3 (Week 1): Pt will be able to propel wc 50 ft with supervision. PT Short Term Goal 3 - Progress (Week 1): Met Week 2:  PT Short Term Goal 1 (Week 2): Pt will perform least restrictive transfer with max A PT Short Term Goal 1 - Progress (Week 2): Met PT Short Term Goal 2 (Week 2): Pt will tolerate standing x 5 min with LRAD PT Short Term Goal 2 - Progress (Week 2): Progressing toward goal PT Short Term Goal 3 (Week 2): Pt will propel w/c x 150 ft with min A PT Short Term Goal 3 - Progress (Week 2): Met PT Short Term Goal 4 (Week 2): Pt will maintain dynamic sitting balance x 5 min with min A PT Short Term Goal 4 - Progress (Week 2): Met Week 3:  PT Short Term Goal 1 (Week 3): Pt will perform least restrictive transfer with mod A consistently. PT Short Term Goal 1 - Progress (Week 3): Met PT Short Term Goal 2 (Week 3): Pt will tolerate standing x 5 min with LRAD. PT Short Term Goal 2 - Progress (Week 3): Not met PT Short Term Goal 3 (Week 3): Pt will propel wc x 300 ft with supervision. PT Short Term Goal 3 - Progress (Week 3): Met  Skilled Therapeutic Interventions/Progress Updates:    Pain:  Pt reports 2/10 pain.  Treatment to tolerance.  Rest breaks and repositioning as needed. No family present during this session. Pt initially oob in wc and agreeable to treatment session with focus on standing posture and stability, Sit to stand, and transfers. Pt  propels wc mod I > 165f. In parallel bars worked on S[employer] Groupto stand, sMedical sales representativefor visual feedback/promote improved upright, verbal and tactille cues to improve quad and gult activation in standing.  Sit to stand requires min assit using bars to pull up, repeated standing activity up to 1 min x 5.  Sit to stand and gait 848fx 2 in parallel bars w/close wc follow, verbal and tactile cues to prevent scissoring, cues for activation of gluts and quads w/stance, cues to increase upright trunk w/gait.  Improved knee stability and ability to conteract scissoring noted.  Gait 67f60f/RW, verbal encouragement/assurance, close wc follow, gait pattern as above, slowed due to pt fearful w/attempting w/RW vs parallel bars, no buckling w/task today.  Pt propels wc mod I back to room.  Pt left oob in wc w/alarm belt set and needs in reach    Therapy Documentation Precautions:  Precautions Precautions: Back,Fall,Other (comment) Precaution Booklet Issued: No Precaution Comments: paraparesis Required Braces or Orthoses: Spinal Brace Spinal Brace: Other (comment) Spinal Brace Comments: Figure 8 Brace Restrictions Weight Bearing Restrictions: No       Therapy/Group: Individual Therapy  BarCallie FieldingT [place]3/2022, 12:23 PM

## 2020-08-24 NOTE — Progress Notes (Signed)
[place] PHYSICAL MEDICINE & REHABILITATION PROGRESS NOTE  Subjective/Complaints:  Feeling better today- less "blah"-  Has HA- will ask nursing for some tylenol.     ROS:   Pt denies SOB, abd pain, CP, N/V/C/D, and vision changes   Objective: Vital Signs: Blood pressure (!) 144/77, pulse 83, temperature 97.8 F (36.6 C), resp. rate 18, height 5\' 8"  (1.727 m), weight 103.6 kg, SpO2 96 %. No results found. No results for input(s): WBC, HGB, HCT, PLT in the last 72 hours. No results for input(s): NA, K, CL, CO2, GLUCOSE, BUN, CREATININE, CALCIUM in the last 72 hours.  Intake/Output Summary (Last 24 hours) at 08/24/2020 0845 Last data filed at 08/24/2020 0806 Gross per 24 hour  Intake 960 ml  Output 1575 ml  Net -615 ml        Physical Exam: BP (!) 144/77   Pulse 83   Temp 97.8 F (36.6 C)   Resp 18   Ht 5\' 8"  (1.727 m)   Wt 103.6 kg   SpO2 96%   BMI 34.73 kg/m      General: awake, alert, appropriate, NAD- just woke up, sleepy HENT: conjugate gaze; oropharynx moist CV: regular rate and rhythm; no JVD Pulmonary: CTA B/L; no W/R/R- good air movement GI: soft, NT, ND, (+)BS Psychiatric: appropriate Neurological: Ox3 Skin: intact Motor: Bilateral upper extremities: 5/5  Right lower extremity: Hip flexion, knee extension 2+/5, ankle dorsiflexion 4-/5, no change Left lower extremity: Hip flexion, knee extension 2/5, ankle dorsiflexion 4 -/5, no change  Sensation diminished sl to light touch bilateral lower extremities    Assessment/Plan: 1. Functional deficits which require 3+ hours per day of interdisciplinary therapy in a comprehensive inpatient rehab setting.  Physiatrist is providing close team supervision and 24 hour management of active medical problems listed below.  Physiatrist and rehab team continue to assess barriers to discharge/monitor patient progress toward functional and medical goals   Care Tool:  Bathing    Body parts bathed by patient:  Right arm,Left arm,Chest,Abdomen,Front perineal area,Right upper leg,Left upper leg,Face,Right lower leg,Left lower leg   Body parts bathed by helper: Buttocks,Right lower leg,Left lower leg     Bathing assist Assist Level: Minimal Assistance - Patient > 75% (therapist washed buttocks via BSC hole, assist for thoroughly washing feet after LHS)     Upper Body Dressing/Undressing Upper body dressing   What is the patient wearing?: Pull over shirt    Upper body assist Assist Level: Supervision/Verbal cueing    Lower Body Dressing/Undressing Lower body dressing      What is the patient wearing?: Incontinence brief,Pants     Lower body assist Assist for lower body dressing: Moderate Assistance - Patient 50 - 74%     Toileting Toileting    Toileting assist Assist for toileting: Total Assistance - Patient < 25% Assistive Device Comment: pericare after urinal   Transfers Chair/bed transfer  Transfers assist     Chair/bed transfer assist level: Moderate Assistance - Patient 50 - 74%     Locomotion Ambulation   Ambulation assist   Ambulation activity did not occur: Safety/medical concerns  Assist level: 2 helpers Assistive device: Lite Gait Max distance: 24   Walk 10 feet activity   Assist  Walk 10 feet activity did not occur: Safety/medical concerns  Assist level: 2 helpers Assistive device: Lite Gait   Walk 50 feet activity   Assist Walk 50 feet with 2 turns activity did not occur: Safety/medical concerns  Walk 150 feet activity   Assist Walk 150 feet activity did not occur: Safety/medical concerns         Walk 10 feet on uneven surface  activity   Assist Walk 10 feet on uneven surfaces activity did not occur: Safety/medical concerns         Wheelchair     Assist Will patient use wheelchair at discharge?: Yes Type of Wheelchair: Manual    Wheelchair assist level: Supervision/Verbal cueing,Set up assist Max wheelchair  distance: 100    Wheelchair 50 feet with 2 turns activity    Assist        Assist Level: Supervision/Verbal cueing   Wheelchair 150 feet activity     Assist      Assist Level: Supervision/Verbal cueing    Medical Problem List and Plan: 1.  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  Continue CIR PT, OT  2/24- moved d/c date to later in March- moved out 1 week or so.   -He was upset he was not OOB on Saturday- placed daily OOB order 2.  Antithrombotics: -DVT/anticoagulation:  Pharmaceutical: Lovenox    Lower extremity Dopplers ordered  Dopplers (-)- will continue Lovenox  3/1- will need Lovenox for 2 months total from surgery- will order Lovenox teaching.  3/3- double check that wife is taught Lovenox injections              -antiplatelet therapy: N/a 3. Pain Management: Continue oxycodone prn.              --neuropathy BLE and right chest wall-->continue gabapentin 600 mg tid for now.   2/7- will Add Duloxetine 30 mg QHS and decrease Prozac to 20 mg daily since needs help with nerve pain and already at 600 mg TID of gabapentin  2/16- will increase Oxycodone to 15 mg q4 hours prn   2/17- pain doing better- today- explained might have difficulty giving this much pain meds when leaves, due to  [employer] for first 7 days- will see what we can do.   2/25- pain controlled- will see if pain doing better, so can try to wean Oxy prn if possible  2/27- pain stable- will d/w pt Tuesday about trying to wean meds a little  3/1- pt doesn't want to wean meds as of yet- will d/w pt Thursday  3/3- reduced Oxy to 10-15 mg based on pain scores 4. H/o anxiety/Mood: LCSW to follow for evaluation and support.              Prozaac  2/7- decrease to 20 mg daily so has Duloxetine  2/11- no  change in affect  2/18- since on prozac, cannot increase Duloxetine  2/21- mood better last few days- con't regimen             -antipsychotic agents: Continue Zyprexa 5. Neuropsych: This patient is capable of making decisions on his own behalf. 6. Skin/Wound Care: Routine pressure relief measures. Hypoallergenic sheets.   Prevalon boots for both heels ordered 7. Fluids/Electrolytes/Nutrition: Monitor I/Os.    8. Neurogenic bladder:   UA unremarkable, urine culture pending  2/7- will recheck U/A and Cx due to incontinence AND leukocytosis  2/8- F/U U/A is strongly (+) for UTI- started Keflex 500 mg TID- awaiting Cx- will recheck CBC in AM- wait on stopping Flomax.  2/9- U Cx (+) for Ecoli-  pansensitive- WBC down to 14k- will con't Keflex 500 mg TID for a total of 7 days.    2/10- labs in AM- CBC-diff and BMP to make sure things normalizing.  2/14- increased Flomax to 0.8 mg qevening- since required cath x1-   3/1- no caths- voiding OK- con't Flomax 0.8 mg nightly  3/2- stop Keflex for UTI 9. HTN: Monitor BP   Norvasc  Mildly elevated on 2/6, monitor for trend  2/28-systolic elevated last 2 reads but has mostly been stable-continue Norvasc  3/1- BP 150s/80s- this is unusual for pt- if no improvement in next 1-2 days, will increase Meds- will verify no orthostatic hypotension with walking.   3/2- BP much better today 122/86- won't make any BP med changes- no orthostasis per PT/OT with therapy.   3/3 BP 144/77- con't regimen             Monitor with increased mobility 10. Neurogenic bowel:    KUB reviewed, unremarkable             Miralax daily and dulcolax supp w/dig stim after supper.   2/9- refused all bowel program x5+ days- explained to pt why he needs it- should be able to avoid "2 days of stools", but the more he waits, the more chance he will have  A 1-2 day "blowout", which will be due to not emptying gut. He said he would try it.  2/10- refused bowel program again. Incontinent-  doesn't understand can't control bowels.  2/13- pt continues to refuse bowel program. Had a frank conversation as to why we're doing it. Pt seemed to listen/understand. We'll see how it goes tonight  2/17- discussed bowel program and how it's to help reduce accidents over 3-6 weeks, not instantly- explained to pt/wife- can also order dig stim stick and suppository inserter as well- explained they are on [place] if wife wants to get them. Con't bowel program- had bowel accident last night- no BM with program.  2/27- pt isn't concerned about bowel accidents- won't do dig stim- con't suppository nightly  3/1- pt doing miralax and Suppository daily- still having some accidents per pt- con't regimen 11.  Hypoalbuminemia  Supplement initiated on 2/5 12.  Transaminitis  Resolved 13.  Leukocytosis  WBC 12.1 on 2/5, labs ordered for tomorrow  2/7- WBC 17.7!- will check U/A and Cx again AND CXR since WBC up so much- not on steroids. Afebrile and says he doesn't feel ill.   2/8- has UTI- denied Sx's of UTI, however has incontinence- Keflex 500 mg TID- will wait for Cx results.   2/9- Cx shows ECOLI- PAN-SENSITIVE- CON'T Keflex  2/11- Keflex x total of 7 days-   2/15- WBC 8.2- resolved  3/2- stop Keflex 14. Anemia: Hgb reviewed and low at 11.9, but improved from last read 15. Dispo  2/17- family conference today- spent 30 minutes in discussions about medical issues- as detailed about- explained to pt I know he gets scared when confronting something new- explained in stead of hearing I can't- lets try "i'm going to try".   2/25- walked 9 ft with RW- pt very excited about improvement  3/1- pt working on turning to sides with RW and tolerating/balancing- con't therapy. W/C evaluation tomorrow- will see who's doing.   Pt meets criteria for an ultra light manual w/c and pressure relieving w/c cushion due to incomplete paraplegia- he is not walking at this time, and not sure will be able to in the future-  he  needs  it to get from 1 place to another- propel w/c- cannot use heavier w/c, because he will need for 100% of propulsion and wife cannot push him; also will make transfers and pressure relief easier to use this type of w/c, which will allow pt to stay pressure ulcer free- hopefully, if does his pressure relief. Absolutely essential that he receive an ultra light weight manual w/c.      LOS: 27 days A FACE TO FACE EVALUATION WAS PERFORMED  Randall Mccormick 08/24/2020, 8:45 AM

## 2020-08-24 NOTE — Progress Notes (Signed)
Occupational Therapy Session Note  Patient Details  Name: Randall Mccormick MRN: [id number] Date of Birth: [birth year]  Today's Date: 08/24/2020 OT Individual Time: 1445-1525 OT Individual Time Calculation (min): 40 min    Short Term Goals: Week 1:  OT Short Term Goal 1 (Week 1): Pt will complete BSC transfer with LRAD and 2 assist OT Short Term Goal 1 - Progress (Week 1): Progressing toward goal OT Short Term Goal 2 (Week 1): Pt will complete UB dressing with Mod A OT Short Term Goal 2 - Progress (Week 1): Met OT Short Term Goal 3 (Week 1): Pt will complete 1 grooming task seated EOB with no more than Mod balance assistance OT Short Term Goal 3 - Progress (Week 1): Met  Skilled Therapeutic Interventions/Progress Updates:    1;1. Pt and RT in room. Pt with no pain reported. Pt feet slipping off foot rest of w/c. OT attatched wooden board to w/c with coban and theraband to keep B feet from slipping off foot plate with limited success. Pt propels w/c to outside courtyard to picnic table. Pt, OT and RT discuss travel accessibility, managing expectations and advocacy for needs on upcoming trip at end of summer. Pt participates well in dynamic discussion about bathroom access, excursions, energy conservation as well as flying. Exited session with pt seated in bed, exit alarm on and call light in reach   Therapy Documentation Precautions:  Precautions Precautions: Back,Fall,Other (comment) Precaution Booklet Issued: No Precaution Comments: paraparesis Required Braces or Orthoses: Spinal Brace Spinal Brace: Other (comment) Spinal Brace Comments: Figure 8 Brace Restrictions Weight Bearing Restrictions: No General:   Vital Signs: Therapy Vitals Temp: 97.8 F (36.6 C) Pulse Rate: 83 Resp: 18 BP: (!) 144/77 Patient Position (if appropriate): Lying Oxygen Therapy SpO2: 96 % Pain:   ADL: ADL Eating: Not assessed Grooming: Setup Where Assessed-Grooming: Bed level Upper Body Bathing:  Moderate assistance Where Assessed-Upper Body Bathing: Edge of bed (taking sitting balance into consideration) Lower Body Bathing: Dependent (+2 assist) Where Assessed-Lower Body Bathing: Bed level Upper Body Dressing: Maximal assistance Where Assessed-Upper Body Dressing: Edge of bed Lower Body Dressing: Dependent Where Assessed-Lower Body Dressing: Bed level Toileting: Dependent (+2 assist) Toilet Transfer: Not assessed Tub/Shower Transfer: Not assessed ADL Comments: Toilet and shower transfers not safe to attempt due to pts trunk control deficits and B LE weakness Vision   Perception    Praxis   Exercises:   Other Treatments:     Therapy/Group: Individual Therapy  Tonny Branch 08/24/2020, 7:01 AM

## 2020-08-24 NOTE — Progress Notes (Signed)
Occupational Therapy Session Note  Patient Details  Name: Randall Mccormick MRN: [id number] Date of Birth: [birth year]  Today's Date: 08/24/2020 OT Individual Time: 1110-1205 OT Individual Time Calculation (min): 55 min    Short Term Goals: Week 4:  OT Short Term Goal 1 (Week 4): Pt will complete LB dressing with mod A using AE PRN OT Short Term Goal 2 (Week 4): Pt will complete toileting tasks with max A OT Short Term Goal 3 (Week 4): Pt will complete sliding board transfers to the drop arm commode with mod assist.  Skilled Therapeutic Interventions/Progress Updates:    Pt received in wc eager to shower. Pt wanted to use stedy only to get in and out of the shower. Able to rise to stand with S in stedy.   See ADL documentation below. On BSC in shower, pt used long sponge to reach his feet and could reach all other areas except his bottom.  Pt was incontinent of bowel in shower but was not aware.  He needed assist to cleanse.  (at end of session, realized pt has his personal AE in his room. He has a toilet aid and long tongs).  Suggested he try the tongs with a wash cloth to wash his bottom during next shower.  He rolled his eyes at this idea and suggested he at least give it a try. Pt used reacher to don shorts over feet.  Suggested he try to stand with the RW to pull over hips but said he was not ready to try that. Instead he pushed up with B hands and therapist pulled shorts over hips.  A with socks and then he completed oral care at sink.   Set up in wc with all needs met. Alarm set.   Therapy Documentation Precautions:  Precautions Precautions: Back,Fall,Other (comment) Precaution Booklet Issued: No Precaution Comments: paraparesis Required Braces or Orthoses: Spinal Brace Spinal Brace: Other (comment) Spinal Brace Comments: Figure 8 Brace Restrictions Weight Bearing Restrictions: No      Pain: Pain Assessment Pain Scale: 0-10 Pain Score: 2  Pain Location: Leg Pain Descriptors  / Indicators: Aching;Spasm Patients Stated Pain Goal: 1 Pain Intervention(s): Medication (See eMAR) ADL: ADL Eating: Independent Grooming: Modified independent Where Assessed-Grooming: Sitting at sink Upper Body Bathing: Setup Where Assessed-Upper Body Bathing: Shower Lower Body Bathing: Minimal assistance Where Assessed-Lower Body Bathing: Shower Upper Body Dressing: Setup Where Assessed-Upper Body Dressing: Wheelchair Lower Body Dressing: Moderate assistance Where Assessed-Lower Body Dressing: Wheelchair Toileting: Dependent (+2 assist) Toilet Transfer: Not assessed Tub/Shower Transfer: Not assessed Social research officer, government Method: Other (comment) (stedy to [facility] in shower)   Therapy/Group: Individual Therapy  [place] 08/24/2020, 12:55 PM

## 2020-08-24 NOTE — Progress Notes (Signed)
Recreational Therapy Session Note  Patient Details  Name: Randall Mccormick MRN: [id number] Date of Birth: [birth year] Today's Date: 08/24/2020  Pain: no c/o Skilled Therapeutic Interventions/Progress Updates: Session focused on activity tolerance, w/c mobiltiy, discharge planning, discussing accessible travel and identifying potential barriers.  Pt performed w/c mobility throughout the unit and outside with supervision.  Also attempted to address feet slipping forward off of footplate with little success.  Pt states PT was contacting vendor for suggestions.  Dania [place] 08/24/2020, 4:00 PM

## 2020-08-24 NOTE — Plan of Care (Signed)
  Problem: Consults Goal: RH SPINAL CORD INJURY PATIENT EDUCATION Description:  See Patient Education module for education specifics.  Outcome: Progressing   Problem: SCI BOWEL ELIMINATION Goal: RH STG MANAGE BOWEL WITH ASSISTANCE Description: STG Manage Bowel with min Assistance. Outcome: Progressing Goal: RH STG SCI MANAGE BOWEL PROGRAM W/ASSIST OR AS APPROPRIATE Description: STG SCI Manage bowel program with min assist or as appropriate. Outcome: Progressing   Problem: SCI BLADDER ELIMINATION Goal: RH STG SCI MANAGE BLADDER PROGRAM W/ASSISTANCE Description: Manage bladder program with min assist Outcome: Progressing   Problem: RH PAIN MANAGEMENT Goal: RH STG PAIN MANAGED AT OR BELOW PT'S PAIN GOAL Description: Pain level less than 4 on scale of 0-10 Outcome: Progressing   Problem: RH KNOWLEDGE DEFICIT SCI Goal: RH STG INCREASE KNOWLEDGE OF SELF CARE AFTER SCI Description: Pt will be able to demonstrate management of bowel and bladder programs with min assist.  Outcome: Progressing   Problem: RH SKIN INTEGRITY Goal: RH STG MAINTAIN SKIN INTEGRITY WITH ASSISTANCE Description: STG Maintain Skin Integrity With min Assistance. Outcome: Progressing

## 2020-08-24 NOTE — Progress Notes (Signed)
Occupational Therapy Session Note  Patient Details  Name: Randall Mccormick MRN: [id number] Date of Birth: 08/15/1955  Today's Date: 08/24/2020 OT Individual Time: 7366-8159 OT Individual Time Calculation (min): 28 min    Short Term Goals: Week 3:  OT Short Term Goal 2 (Week 3): Pt will complete BSC transfer with LRAD and 2 assist OT Short Term Goal 2 - Progress (Week 3): Not met OT Short Term Goal 3 (Week 3): Pt will complete LB dressing with mod A using AE PRN OT Short Term Goal 3 - Progress (Week 3): Not met OT Short Term Goal 4 (Week 3): Pt will complete toileting tasks with max A OT Short Term Goal 4 - Progress (Week 3): Not met Week 4:  OT Short Term Goal 1 (Week 4): Pt will complete LB dressing with mod A using AE PRN OT Short Term Goal 2 (Week 4): Pt will complete toileting tasks with max A OT Short Term Goal 3 (Week 4): Pt will complete sliding board transfers to the drop arm commode with mod assist.   Skilled Therapeutic Interventions/Progress Updates:    Pt greeted at time of session sitting up in wheelchair agreeable to OT session, no pain. Discussed foot rests on wheelchair that did not fit right, PT already aware. Provided dycem later in session in attempt to keep feet in place. Provided hand out as well for bed rail for home use from PT. Pt self propel to gym Supervision, performed sit > stands 3x Mod A each time able to stand  From 10-30 seconds at RW to improve standing balance for LB ADL. Note anterior lean noted and cues for hip extension and upright posture during standing, mirror for feedback. Transported back to room, alarm on call bell  In reach planning to sit up for dinner.   Therapy Documentation Precautions:  Precautions Precautions: Back,Fall,Other (comment) Precaution Booklet Issued: No Precaution Comments: paraparesis Required Braces or Orthoses: Spinal Brace Spinal Brace: Other (comment) Spinal Brace Comments: Figure 8 Brace Restrictions Weight Bearing  Restrictions: No     Therapy/Group: Individual Therapy  Viona Gilmore 08/24/2020, 7:28 AM

## 2020-08-25 DIAGNOSIS — M5104 Intervertebral disc disorders with myelopathy, thoracic region: Secondary | ICD-10-CM | POA: Diagnosis not present

## 2020-08-25 MED ORDER — ENOXAPARIN (LOVENOX) PATIENT EDUCATION KIT
PACK | Freq: Once | Status: AC
  Filled 2020-08-25: qty 1

## 2020-08-25 NOTE — Progress Notes (Signed)
Physical Therapy Session Note  Patient Details  Name: Randall Mccormick MRN: [id number] Date of Birth: 02/22/1956  Today's Date: 08/25/2020 PT Individual Time: 1302-1358 PT treatment time  40 min   Short Term Goals: Week 4:  PT Short Term Goal 1 (Week 4): Pt will perform stand pivot transfer with LRAD and min A x1 consistently. PT Short Term Goal 2 (Week 4): Pt will tolerate standing x 5 min with LRAD. PT Short Term Goal 3 (Week 4): Pt will propel wc x 500 ft with supervision.  Skilled Therapeutic Interventions/Progress Updates: Pt presents sitting in w/c and eager for therapy to do" more walking" as wants to be doing that at home.  Pt required min A for sit to stand transfers but verbal cues required for initiation of transfers, esp. B foot placement and for forward lean.  Pt amb multiple trials w/ mod A and RW.  Pt given visual input of mirror to improve upright posture and foot placement.  Pt amb w/ decreased BOS at best and scissoring at worst, ext rotated feet.  Pt amb up to 15' including turns to approach seat, w/ rest breaks required for fatigue.  Pt states during gait trial, fatigues and then increasing WB through UEs, but normally about 30% through RW.  Pt negotiated w/ multiple trials on varying terrain, including slightly ramped surface up to 250'.    Pt returned to room and remained in w/c w/ chair alarm on and all need in reach.     Therapy Documentation Precautions:  Precautions Precautions: Back,Fall,Other (comment) Precaution Booklet Issued: No Precaution Comments: paraparesis Required Braces or Orthoses: Spinal Brace Spinal Brace: Other (comment) Spinal Brace Comments: Figure 8 Brace Restrictions Weight Bearing Restrictions: No General:   Vital Signs: Therapy Vitals Temp: 98.7 F (37.1 C) Pulse Rate: 92 Resp: 18 BP: 119/66 Patient Position (if appropriate): Sitting Oxygen Therapy SpO2: 99 % O2 Device: Room Air Pain: 0/10.        Therapy/Group: Individual  Therapy  [name] 08/25/2020, 1:56 PM

## 2020-08-25 NOTE — Progress Notes (Signed)
Occupational Therapy Session Note ° °Patient Details  °Name: Randall Mccormick °MRN: [id number] °Date of Birth: 11/26/1955 ° °Today's Date: 08/25/2020 °OT Individual Time: 1430-1515 °OT Individual Time Calculation (min): 45 min  ° ° °Short Term Goals: °Week 1:  OT Short Term Goal 1 (Week 1): Pt will complete BSC transfer with LRAD and 2 assist °OT Short Term Goal 1 - Progress (Week 1): Progressing toward goal °OT Short Term Goal 2 (Week 1): Pt will complete UB dressing with Mod A °OT Short Term Goal 2 - Progress (Week 1): Met °OT Short Term Goal 3 (Week 1): Pt will complete 1 grooming task seated EOB with no more than Mod balance assistance °OT Short Term Goal 3 - Progress (Week 1): Met ° °Skilled Therapeutic Interventions/Progress Updates:  °  1;1. Pt received in w/c agreeable to OT with no pain. W/c propulsion throughtou unit/hospital for BUE strengthening/community mobility distances. Pt propels up/down ramp with S and in/around displays of giftshop with OT asking questions for problem solving reaching varous height items with recommendation of having back pack on back of w/c with reacher. ° ° Pt completes 2x10-15 dowel rod therex for BUE shoulder strengthening required for BADLs/functional transfers as follows with demo cuing and 5 # dowel rod ° °Shoulder flex/ext, shoulder press, horizonal ab/adduct °Circles in B directions °Elbow flex/ext, chest press °Wrist flex/ext °Exited session with pt seated in bed, exit alarm on and call light in reach ° ° °Therapy Documentation °Precautions:  °Precautions °Precautions: Back,Fall,Other (comment) °Precaution Booklet Issued: No °Precaution Comments: paraparesis °Required Braces or Orthoses: Spinal Brace °Spinal Brace: Other (comment) °Spinal Brace Comments: Figure 8 Brace °Restrictions °Weight Bearing Restrictions: No °General: °  °Vital Signs: °Therapy Vitals °Temp: 98.4 °F (36.9 °C) °Pulse Rate: 81 °Resp: 17 °BP: 128/71 °Patient Position (if appropriate): Lying °Oxygen  Therapy °SpO2: 96 % °Pain: °  °ADL: °ADL °Eating: Independent °Grooming: Modified independent °Where Assessed-Grooming: Sitting at sink °Upper Body Bathing: Setup °Where Assessed-Upper Body Bathing: Shower °Lower Body Bathing: Minimal assistance °Where Assessed-Lower Body Bathing: Shower °Upper Body Dressing: Setup °Where Assessed-Upper Body Dressing: Wheelchair °Lower Body Dressing: Moderate assistance °Where Assessed-Lower Body Dressing: Wheelchair °Toileting: Dependent (+2 assist) °Toilet Transfer: Not assessed °Tub/Shower Transfer: Not assessed °Walk-In Shower Transfer Method: Other (comment) (stedy to BSC in shower) °ADL Comments: Toilet and shower transfers not safe to attempt due to pts trunk control deficits and B LE weakness °Vision °  °Perception  °  °Praxis °  °Exercises: °  °Other Treatments:   ° ° °Therapy/Group: Individual Therapy ° ° M  °08/25/2020, 6:45 AM ° °

## 2020-08-25 NOTE — Progress Notes (Signed)
Physical Therapy Session Note  Patient Details  Name: Dustine Mccormick MRN: [id number] Date of Birth: [birth year]  Today's Date: 08/25/2020 PT Individual Time: 1022-1120 PT Individual Time Calculation (min): 58 min   Short Term Goals: Week 4:  PT Short Term Goal 1 (Week 4): Pt will perform stand pivot transfer with LRAD and min A x1 consistently. PT Short Term Goal 2 (Week 4): Pt will tolerate standing x 5 min with LRAD. PT Short Term Goal 3 (Week 4): Pt will propel wc x 500 ft with supervision.  Skilled Therapeutic Interventions/Progress Updates:   Pt received supine in bed and agreeable to PT. Pt reports need for urination. able to complete bladder void in supine without assist using urinal. Supine>sit transfer with supervision assist and cues for sequencing of BLE movement. PT assisted pt to don Bil AFO sitting EOB with total A. Sit<>stand from EOB with mod assist  For PT to pull pants to waist. Min assist to prevent LOB to the R. Stand pivot transfer to [facility] with mod assist due to L knee buckle x 1 in transfer, but able to correct with assist.   WC Mobility training through hall of rehab unit x 27f, 1568f and 12029fno cues or assist needed once PT placeed BLE on foot rest with tband to stabilize BLE.   Sit<>stand performed with mod assist throughout session with assist to facilitate anterior weight shift. Dynamic balance/pregait training to perform stepping to target forward x 6 BLE and lateral x 6 BLE with BUE support on RW. Pt performed static balance with onlt 1 UE support x 6 sec for 2 bouts each UE, min assist from PT and moderate cues for improved terminal knee extension on the RLE.   Gait training with RW x [id number] min assist from PT and +2 for WC follow. Cues for improved posture and increased step width as able by pt.   UBE 3 min forward/3 min reverse with prolonged therapeutic resat break between bouts. Patient returned to room and [facility] in reach and  all needs met.          Therapy Documentation Precautions:  Precautions Precautions: Back,Fall,Other (comment) Precaution Booklet Issued: No Precaution Comments: paraparesis Required Braces or Orthoses: Spinal Brace Spinal Brace: Other (comment) Spinal Brace Comments: Figure 8 Brace Restrictions Weight Bearing Restrictions: No Pain: denies   Therapy/Group: Individual Therapy  AustLorie Phenix/2022, 1:02 PM

## 2020-08-25 NOTE — Progress Notes (Signed)
[place] PHYSICAL MEDICINE & REHABILITATION PROGRESS NOTE  Subjective/Complaints:  Pt reports couldn't sleep til 2:30 am- usually gets trazodone, but just couldn't sleep for some reason.   But doesn't want to change sleeping meds.   ROS:   Pt denies SOB, abd pain, CP, N/V/C/D, and vision changes   Objective: Vital Signs: Blood pressure 128/71, pulse 81, temperature 98.4 F (36.9 C), resp. rate 17, height 5\' 8"  (1.727 m), weight 103.6 kg, SpO2 96 %. No results found. No results for input(s): WBC, HGB, HCT, PLT in the last 72 hours. No results for input(s): NA, K, CL, CO2, GLUCOSE, BUN, CREATININE, CALCIUM in the last 72 hours.  Intake/Output Summary (Last 24 hours) at 08/25/2020 0820 Last data filed at 08/25/2020 0700 Gross per 24 hour  Intake 600 ml  Output --  Net 600 ml        Physical Exam: BP 128/71   Pulse 81   Temp 98.4 F (36.9 C)   Resp 17   Ht 5\' 8"  (1.727 m)   Wt 103.6 kg   SpO2 96%   BMI 34.73 kg/m       General: awake, alert, appropriate, NAD; sleepy, woke to stimuli HENT: conjugate gaze; oropharynx moist CV: regular rate and rhythm; no JVD Pulmonary: CTA B/L; no W/R/R- good air movement GI: soft, NT, ND, (+)BS Psychiatric: appropriate- flat affect Neurological: Ox3 Skin: intact Motor: Bilateral upper extremities: 5/5  Right lower extremity: Hip flexion, knee extension 2+/5, ankle dorsiflexion 4-/5, no change Left lower extremity: Hip flexion, knee extension 2/5, ankle dorsiflexion 4 -/5, no change  Sensation diminished sl to light touch bilateral lower extremities    Assessment/Plan: 1. Functional deficits which require 3+ hours per day of interdisciplinary therapy in a comprehensive inpatient rehab setting.  Physiatrist is providing close team supervision and 24 hour management of active medical problems listed below.  Physiatrist and rehab team continue to assess barriers to discharge/monitor patient progress toward functional and medical  goals   Care Tool:  Bathing    Body parts bathed by patient: Right arm,Left arm,Chest,Abdomen,Front perineal area,Right upper leg,Left upper leg,Face,Right lower leg,Left lower leg   Body parts bathed by helper: Buttocks     Bathing assist Assist Level: Minimal Assistance - Patient > 75%     Upper Body Dressing/Undressing Upper body dressing   What is the patient wearing?: Pull over shirt    Upper body assist Assist Level: Set up assist    Lower Body Dressing/Undressing Lower body dressing      What is the patient wearing?: Incontinence brief,Pants     Lower body assist Assist for lower body dressing: Moderate Assistance - Patient 50 - 74%     Toileting Toileting    Toileting assist Assist for toileting: Total Assistance - Patient < 25% Assistive Device Comment: pericare after urinal   Transfers Chair/bed transfer  Transfers assist     Chair/bed transfer assist level: Moderate Assistance - Patient 50 - 74%     Locomotion Ambulation   Ambulation assist   Ambulation activity did not occur: Safety/medical concerns  Assist level: 2 helpers Assistive device: Walker-rolling Max distance: 7   Walk 10 feet activity   Assist  Walk 10 feet activity did not occur: Safety/medical concerns  Assist level: 2 helpers Assistive device: Lite Gait   Walk 50 feet activity   Assist Walk 50 feet with 2 turns activity did not occur: Safety/medical concerns         Walk 150 feet activity  Assist Walk 150 feet activity did not occur: Safety/medical concerns         Walk 10 feet on uneven surface  activity   Assist Walk 10 feet on uneven surfaces activity did not occur: Safety/medical concerns         Wheelchair     Assist Will patient use wheelchair at discharge?: Yes Type of Wheelchair: Manual    Wheelchair assist level: Supervision/Verbal cueing,Set up assist Max wheelchair distance: 100    Wheelchair 50 feet with 2 turns  activity    Assist        Assist Level: Supervision/Verbal cueing   Wheelchair 150 feet activity     Assist      Assist Level: Supervision/Verbal cueing    Medical Problem List and Plan: 1.  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  Continue CIR PT, OT  2/24- moved d/c date to later in March- moved out 1 week or so.   -He was upset he was not OOB on Saturday- placed daily OOB order 2.  Antithrombotics: -DVT/anticoagulation:  Pharmaceutical: Lovenox    Lower extremity Dopplers ordered  Dopplers (-)- will continue Lovenox  3/1- will need Lovenox for 2 months total from surgery- will order Lovenox teaching.  3/3- double check that wife is taught Lovenox injections              -antiplatelet therapy: N/a 3. Pain Management: Continue oxycodone prn.              --neuropathy BLE and right chest wall-->continue gabapentin 600 mg tid for now.   2/7- will Add Duloxetine 30 mg QHS and decrease Prozac to 20 mg daily since needs help with nerve pain and already at 600 mg TID of gabapentin  2/16- will increase Oxycodone to 15 mg q4 hours prn   2/17- pain doing better- today- explained might have difficulty giving this much pain meds when leaves, due to  [employer] for first 7 days- will see what we can do.   2/25- pain controlled- will see if pain doing better, so can try to wean Oxy prn if possible  2/27- pain stable- will d/w pt Tuesday about trying to wean meds a little  3/1- pt doesn't want to wean meds as of yet- will d/w pt Thursday  3/3- reduced Oxy to 10-15 mg based on pain scores  3/4- no change in pain levels 4. H/o anxiety/Mood: LCSW to follow for evaluation and support.              Prozaac  2/7- decrease to 20 mg daily so has Duloxetine  2/11- no change in  affect  2/18- since on prozac, cannot increase Duloxetine  2/21- mood better last few days- con't regimen             -antipsychotic agents: Continue Zyprexa 5. Neuropsych: This patient is capable of making decisions on his own behalf. 6. Skin/Wound Care: Routine pressure relief measures. Hypoallergenic sheets.   Prevalon boots for both heels ordered 7. Fluids/Electrolytes/Nutrition: Monitor I/Os.    8. Neurogenic bladder:   UA unremarkable, urine culture pending  2/7- will recheck U/A and Cx due to incontinence AND leukocytosis  2/8- F/U U/A is strongly (+) for UTI- started Keflex 500 mg TID- awaiting Cx- will recheck CBC in AM- wait on stopping Flomax.  2/9- U Cx (+) for  Ecoli- pansensitive- WBC down to 14k- will con't Keflex 500 mg TID for a total of 7 days.    2/10- labs in AM- CBC-diff and BMP to make sure things normalizing.  2/14- increased Flomax to 0.8 mg qevening- since required cath x1-   3/1- no caths- voiding OK- con't Flomax 0.8 mg nightly  3/2- stop Keflex for UTI 9. HTN: Monitor BP   Norvasc  Mildly elevated on 2/6, monitor for trend  2/28-systolic elevated last 2 reads but has mostly been stable-continue Norvasc  3/1- BP 150s/80s- this is unusual for pt- if no improvement in next 1-2 days, will increase Meds- will verify no orthostatic hypotension with walking.   3/2- BP much better today 122/86- won't make any BP med changes- no orthostasis per PT/OT with therapy.   3/3 BP 144/77- con't regimen  3/4- BP 128/77- con't regimen             Monitor with increased mobility 10. Neurogenic bowel:    KUB reviewed, unremarkable             Miralax daily and dulcolax supp w/dig stim after supper.   2/9- refused all bowel program x5+ days- explained to pt why he needs it- should be able to avoid "2 days of stools", but the more he waits, the more chance he will have  A 1-2 day "blowout", which will be due to not emptying gut. He said he would try it.  2/10- refused bowel program  again. Incontinent- doesn't understand can't control bowels.  2/13- pt continues to refuse bowel program. Had a frank conversation as to why we're doing it. Pt seemed to listen/understand. We'll see how it goes tonight  2/17- discussed bowel program and how it's to help reduce accidents over 3-6 weeks, not instantly- explained to pt/wife- can also order dig stim stick and suppository inserter as well- explained they are on [place] if wife wants to get them. Con't bowel program- had bowel accident last night- no BM with program.  2/27- pt isn't concerned about bowel accidents- won't do dig stim- con't suppository nightly  3/1- pt doing miralax and Suppository daily- still having some accidents per pt- con't regimen 11.  Hypoalbuminemia  Supplement initiated on 2/5 12.  Transaminitis  Resolved 13.  Leukocytosis  WBC 12.1 on 2/5, labs ordered for tomorrow  2/7- WBC 17.7!- will check U/A and Cx again AND CXR since WBC up so much- not on steroids. Afebrile and says he doesn't feel ill.   2/8- has UTI- denied Sx's of UTI, however has incontinence- Keflex 500 mg TID- will wait for Cx results.   2/9- Cx shows ECOLI- PAN-SENSITIVE- CON'T Keflex  2/11- Keflex x total of 7 days-   2/15- WBC 8.2- resolved  3/2- stop Keflex 14. Anemia: Hgb reviewed and low at 11.9, but improved from last read 15. Dispo  2/17- family conference today- spent 30 minutes in discussions about medical issues- as detailed about- explained to pt I know he gets scared when confronting something new- explained in stead of hearing I can't- lets try "i'm going to try".   2/25- walked 52 ft with RW- pt very excited about improvement  3/1- pt working on turning to sides with RW and tolerating/balancing- con't therapy. W/C evaluation tomorrow- will see who's doing.   Pt meets criteria for an ultra light manual w/c and pressure relieving w/c cushion due to incomplete paraplegia- he is not walking at this time, and not sure will be able to  in the future-  he needs it to get from 1 place to another- propel w/c- cannot use heavier w/c, because he will need for 100% of propulsion and wife cannot push him; also will make transfers and pressure relief easier to use this type of w/c, which will allow pt to stay pressure ulcer free- hopefully, if does his pressure relief. Absolutely essential that he receive an ultra light weight manual w/c.      LOS: 28 days A FACE TO FACE EVALUATION WAS PERFORMED  Randall Mccormick 08/25/2020, 8:20 AM

## 2020-08-25 NOTE — Progress Notes (Signed)
Lovenox education and  kit provided to patient this shift, wife not available, however patient request that wife be shown on next visit. Will make nursing staff available. Patient voiced understanding of education. Will continue to monitor,.

## 2020-08-26 DIAGNOSIS — M5104 Intervertebral disc disorders with myelopathy, thoracic region: Secondary | ICD-10-CM | POA: Diagnosis not present

## 2020-08-26 NOTE — Progress Notes (Signed)
[place] PHYSICAL MEDICINE & REHABILITATION PROGRESS NOTE  Subjective/Complaints:  Uses urinal for bladder needs, reports having BM "almost daily" sometimes get supp No abd pain  ROS:   Pt denies SOB, abd pain, CP, N/V/C/D, and vision changes   Objective: Vital Signs: Blood pressure 127/75, pulse 84, temperature 97.7 F (36.5 C), temperature source Oral, resp. rate 18, height 5\' 8"  (1.727 m), weight 103.6 kg, SpO2 92 %. No results found. No results for input(s): WBC, HGB, HCT, PLT in the last 72 hours. No results for input(s): NA, K, CL, CO2, GLUCOSE, BUN, CREATININE, CALCIUM in the last 72 hours.  Intake/Output Summary (Last 24 hours) at 08/26/2020 0740 Last data filed at 08/26/2020 0420 Gross per 24 hour  Intake 1080 ml  Output 2200 ml  Net -1120 ml        Physical Exam: BP 127/75 (BP Location: Left Arm)   Pulse 84   Temp 97.7 F (36.5 C) (Oral)   Resp 18   Ht 5\' 8"  (1.727 m)   Wt 103.6 kg   SpO2 92%   BMI 34.73 kg/m   General: No acute distress Mood and affect are appropriate Heart: Regular rate and rhythm no rubs murmurs or extra sounds Lungs: Clear to auscultation, breathing unlabored, no rales or wheezes Abdomen: Positive bowel sounds, soft nontender to palpation, nondistended Extremities: No clubbing, cyanosis, or edema Skin: No evidence of breakdown, no evidence of rash   Skin: intact Motor: Bilateral upper extremities: 5/5  Right lower extremity: Hip flexion, knee extension 2+/5, ankle dorsiflexion 4-/5, no change Left lower extremity: Hip flexion, knee extension 2/5, ankle dorsiflexion 4 -/5, no change  Sensation diminished sl to light touch bilateral lower extremities    Assessment/Plan: 1. Functional deficits which require 3+ hours per day of interdisciplinary therapy in a comprehensive inpatient rehab setting.  Physiatrist is providing close team supervision and 24 hour management of active medical problems listed below.  Physiatrist and rehab  team continue to assess barriers to discharge/monitor patient progress toward functional and medical goals   Care Tool:  Bathing    Body parts bathed by patient: Right arm,Left arm,Chest,Abdomen,Front perineal area,Right upper leg,Left upper leg,Face,Right lower leg,Left lower leg   Body parts bathed by helper: Buttocks     Bathing assist Assist Level: Minimal Assistance - Patient > 75%     Upper Body Dressing/Undressing Upper body dressing   What is the patient wearing?: Pull over shirt    Upper body assist Assist Level: Set up assist    Lower Body Dressing/Undressing Lower body dressing      What is the patient wearing?: Incontinence brief,Pants     Lower body assist Assist for lower body dressing: Moderate Assistance - Patient 50 - 74%     Toileting Toileting    Toileting assist Assist for toileting: Total Assistance - Patient < 25% Assistive Device Comment: pericare after urinal   Transfers Chair/bed transfer  Transfers assist     Chair/bed transfer assist level: Moderate Assistance - Patient 50 - 74%     Locomotion Ambulation   Ambulation assist   Ambulation activity did not occur: Safety/medical concerns  Assist level: Moderate Assistance - Patient 50 - 74% Assistive device: Walker-rolling Max distance: 15   Walk 10 feet activity   Assist  Walk 10 feet activity did not occur: Safety/medical concerns  Assist level: Moderate Assistance - Patient - 50 - 74% Assistive device: Walker-rolling   Walk 50 feet activity   Assist Walk 50 feet with  2 turns activity did not occur: Safety/medical concerns         Walk 150 feet activity   Assist Walk 150 feet activity did not occur: Safety/medical concerns         Walk 10 feet on uneven surface  activity   Assist Walk 10 feet on uneven surfaces activity did not occur: Safety/medical concerns         Wheelchair     Assist Will patient use wheelchair at discharge?: Yes Type of  Wheelchair: Manual    Wheelchair assist level: Supervision/Verbal cueing,Set up assist Max wheelchair distance: 250    Wheelchair 50 feet with 2 turns activity    Assist        Assist Level: Supervision/Verbal cueing   Wheelchair 150 feet activity     Assist      Assist Level: Supervision/Verbal cueing    Medical Problem List and Plan: 1.  Bilateral lower extremity weakness due to incomplete paraplegia, thoracic myelopathy   with sensory deficits, decreased endurance, poor awareness/lacks insight as well as pain affecting ADLs and mobility secondary to thoracic myelopathy/paraplegia with paraparesis.  Continue CIR PT, OT  2.  Antithrombotics: -DVT/anticoagulation:  Pharmaceutical: Lovenox    Lower extremity Dopplers ordered  Dopplers (-)- will continue Lovenox  3/1- will need Lovenox for 2 months total from surgery- will order Lovenox teaching.  3/3- double check that wife is taught Lovenox injections              -antiplatelet therapy: N/a 3. Pain Management: Continue oxycodone prn.              --neuropathy BLE and right chest wall-->continue gabapentin 600 mg tid for now.   2/7- will Add Duloxetine 30 mg QHS and decrease Prozac to 20 mg daily since needs help with nerve pain and already at 600 mg TID of gabapentin  2/16- will increase Oxycodone to 15 mg q4 hours prn   2/17- pain doing better- today- explained might have difficulty giving this much pain meds when leaves, due to  [employer] for first 7 days- will see what we can do.   2/25- pain controlled- will see if pain doing better, so can try to wean Oxy prn if possible  2/27- pain stable- will d/w pt Tuesday about trying to wean meds a little  3/1- pt doesn't want to wean meds as of yet- will d/w pt Thursday  3/3- reduced Oxy to 10-15 mg based on pain scores  3/4- no change in pain levels 4. H/o anxiety/Mood: LCSW to follow for evaluation and support.              Prozaac  2/7- decrease to 20 mg daily so  has Duloxetine  2/11- no change in affect  2/18- since on prozac, cannot increase Duloxetine  2/21- mood better last few days- con't regimen             -antipsychotic agents: Continue Zyprexa 5. Neuropsych: This patient is capable of making decisions on his own behalf. 6. Skin/Wound Care: Routine pressure relief measures. Hypoallergenic sheets.   Prevalon boots for both heels ordered 7. Fluids/Electrolytes/Nutrition: Monitor I/Os.    8. Neurogenic bladder:   UA unremarkable, urine culture pending  2/7- will recheck U/A and Cx due to incontinence AND leukocytosis  2/8- F/U U/A is strongly (+) for UTI- started Keflex 500 mg TID- awaiting Cx- will recheck CBC in AM- wait on stopping Flomax.  2/9- U Cx (+) for Ecoli- pansensitive- WBC down  to 14k- will con't Keflex 500 mg TID for a total of 7 days.    2/10- labs in AM- CBC-diff and BMP to make sure things normalizing.  2/14- increased Flomax to 0.8 mg qevening- since required cath x1-   3/1- no caths- voiding OK- con't Flomax 0.8 mg nightly  3/2- stop Keflex for UTI 9. HTN: Monitor BP   Norvasc  Mildly elevated on 2/6, monitor for trend  2/28-systolic elevated last 2 reads but has mostly been stable-continue Norvasc  3/1- BP 150s/80s- this is unusual for pt- if no improvement in next 1-2 days, will increase Meds- will verify no orthostatic hypotension with walking.   3/2- BP much better today 122/86- won't make any BP med changes- no orthostasis per PT/OT with therapy.   3/3 BP 144/77- con't regimen  3/4- BP 128/77- con't regimen             Monitor with increased mobility 10. Neurogenic bowel:    KUB reviewed, unremarkable             Miralax daily and dulcolax supp w/dig stim after supper.   2/9- refused all bowel program x5+ days- explained to pt why he needs it- should be able to avoid "2 days of stools", but the more he waits, the more chance he will have  A 1-2 day "blowout", which will be due to not emptying gut. He said he would  try it.  2/10- refused bowel program again. Incontinent- doesn't understand can't control bowels.  2/13- pt continues to refuse bowel program. Had a frank conversation as to why we're doing it. Pt seemed to listen/understand. We'll see how it goes tonight  2/17- discussed bowel program and how it's to help reduce accidents over 3-6 weeks, not instantly- explained to pt/wife- can also order dig stim stick and suppository inserter as well- explained they are on [place] if wife wants to get them. Con't bowel program- had bowel accident last night- no BM with program.  2/27- pt isn't concerned about bowel accidents- won't do dig stim- con't suppository nightly  3/1- pt doing miralax and Suppository daily- still having some accidents per pt- con't regimen 11.  Hypoalbuminemia  Supplement initiated on 2/5 12.  Transaminitis  Resolved 13.  Leukocytosis  WBC 12.1 on 2/5, labs ordered for tomorrow  2/7- WBC 17.7!- will check U/A and Cx again AND CXR since WBC up so much- not on steroids. Afebrile and says he doesn't feel ill.   2/8- has UTI- denied Sx's of UTI, however has incontinence- Keflex 500 mg TID- will wait for Cx results.   2/9- Cx shows ECOLI- PAN-SENSITIVE- CON'T Keflex  2/11- Keflex x total of 7 days-   2/15- WBC 8.2- resolved  3/2- stop Keflex 14. Anemia: Hgb reviewed and low at 11.9, but improved from last read 15. Dispo Tent d/c 3/11      LOS: 29 days A FACE TO FACE EVALUATION WAS PERFORMED  Charlett Blake 08/26/2020, 7:40 AM

## 2020-08-27 NOTE — Progress Notes (Signed)
Occupational Therapy Weekly Progress Note  Patient Details  Name: Randall Mccormick MRN: [id number] Date of Birth: February 21, 1956  Beginning of progress report period: 08/18/2020 End of progress report period: 08/27/2020  Today's Date: 08/27/2020 OT Individual Time: 4709-6283 OT Individual Time Calculation (min): 39 min    Patient has met 0 of 3 short term goals.    Pt at present requires Max A for LB dressing tasks, Min A for bathing, and setup for UB self care. He can complete stand pivot transfers using the RW with Mod A and has yet to practice this in the functional context of toileting. Continue OT POC with wife arriving this week for hands on family training in prep for d/c home on 3/11.    Patient continues to demonstrate the following deficits: muscle weakness and muscle paralysis, decreased cardiorespiratoy endurance, unbalanced muscle activation and decreased coordination and decreased standing balance, decreased postural control and decreased balance strategies and therefore will continue to benefit from skilled OT intervention to enhance overall performance with BADL.  Patient progressing toward long term goals..  Continue plan of care.  OT Short Term Goals Week 4:  OT Short Term Goal 1 (Week 4): Pt will complete LB dressing with mod A using AE PRN OT Short Term Goal 1 - Progress (Week 4): Progressing toward goal OT Short Term Goal 2 (Week 4): Pt will complete toileting tasks with max A OT Short Term Goal 2 - Progress (Week 4): Progressing toward goal OT Short Term Goal 3 (Week 4): Pt will complete sliding board transfers to the drop arm commode with mod assist. OT Short Term Goal 3 - Progress (Week 4): Progressing toward goal Week 5:  OT Short Term Goal 1 (Week 5): STGs=LTGs due to ELOS  Skilled Therapeutic Interventions/Progress Updates:    Pt greeted in the bed, reporting B LE pain to be manageable for tx without additional interventions. He stated that his brief was soiled. Brief  found to be saturated with urine, bed linen also saturated. Pt completed perihygiene in the front and OT assisted with perihygiene in the back, pt logrolling Rt>Lt given Min A. We discussed timed toileting at home due to incontinence. Pt appearing resistant to education, stating he planned to just "go whenever (he) has to go." Advised using pull ups as well. Total A for brief change. Supine<sit from flat bed with bedrail use, per setup at home, completed with setup assistance. Pt used the reacher to thread LEs into pants before OT assisted with elevating pants at sit<stand level using RW. Mod A to rise into standing and then he also transferred to the w/c via stand pivot with the same assistance. Pt completed UB self care and oral care with setup assistance-Mod I while seated at the sink. Brought in the extra wide sock aide with pt appearing irritated by this, stating "That won't work with the socks I wear at home. Why won't you people understand that?" He stated that OT has already educated him on using the extra wide and standard sock aide for donning footwear. When OT educated pt on his LB dressing goal, pt stated his wife will help him, adamant about this. He is also adamant that he wants to use the RW at home vs slideboard for toilet transfers needs. At end of session pt remained sitting up in the w/c, left with all needs within reach and safety belt fastened, in care of RN for morning medication.     Therapy Documentation Precautions:  Precautions Precautions: Back,Fall,Other (  comment) Precaution Booklet Issued: No Precaution Comments: paraparesis Required Braces or Orthoses: Spinal Brace Spinal Brace: Other (comment) Spinal Brace Comments: Figure 8 Brace Restrictions Weight Bearing Restrictions: No Vital Signs: Therapy Vitals Temp: 98 F (36.7 C) Temp Source: Oral Pulse Rate: 82 Resp: 18 BP: 132/80 Patient Position (if appropriate): Lying Oxygen Therapy SpO2: 94 % O2 Device: Room  Air ADL: ADL Eating: Independent Grooming: Modified independent Where Assessed-Grooming: Sitting at sink Upper Body Bathing: Setup Where Assessed-Upper Body Bathing: Shower Lower Body Bathing: Minimal assistance Where Assessed-Lower Body Bathing: Shower Upper Body Dressing: Setup Where Assessed-Upper Body Dressing: Wheelchair Lower Body Dressing: Moderate assistance Where Assessed-Lower Body Dressing: Wheelchair Toileting: Dependent (+2 assist) Toilet Transfer: Not assessed Tub/Shower Transfer: Not assessed Social research officer, government Method: Other (comment) (stedy to [facility] in shower) ADL Comments: Toilet and shower transfers not safe to attempt due to pts trunk control deficits and B LE weakness      Therapy/Group: Individual Therapy  Tahmir Kleckner A Yaretsi Humphres 08/27/2020, 7:04 AM

## 2020-08-27 NOTE — Progress Notes (Signed)
Physical Therapy Session Note  Patient Details  Name: Randall Mccormick MRN: [id number] Date of Birth: [birth year]  Today's Date: 08/27/2020 PT Individual Time: 0902-1001, 1400-1445 PT Individual Time Calculation (min): 59 min, 45 min  Short Term Goals: Week 4:  PT Short Term Goal 1 (Week 4): Pt will perform stand pivot transfer with LRAD and min A x1 consistently. PT Short Term Goal 2 (Week 4): Pt will tolerate standing x 5 min with LRAD. PT Short Term Goal 3 (Week 4): Pt will propel wc x 500 ft with supervision.  Skilled Therapeutic Interventions/Progress Updates:    AM session: Pt seated in wc on arrival, agreeable to therapy. Pt propelled wc with BUE to therapy gym mod I. Stand pivot transfer x 2 with min A to R side and RW. STS to RW min A throughout session, with VCs for upright posture and anterior weight shift. Pt performed pre gait activities to fatigue, x 3 bouts (stepping over hockey stick to targets with VCs for weight shift). Gait with RW and mod A for balance and technique cues x 18 ft, x 30 ft, x 35 ft. Demonstrated flexed trunk posture, narrow BOS, external rotation, poor weight shift. Pt stated that his legs were fatigued, pt propelled wc with BUE in community environment with supervision and cues for technique, navigated elevators, obstacles, and slight inclines. Pt returned to room after session and remained seated in wc with quick release belt and all needs in reach.    PM session:  Pt received supine in bed with wife, [name], present for family education. Bed mobility supervision with use of bed features. Pt performs stand pivot transfers with min A and RW throughout session. Pt propelled wc with BUE throughout session, mod I. Only required assist bringing foot plate down into place. Session focused on family education on functional transfers. [name] was educated on and practiced stand pivot transfers from wc<>mat x 2. Pt performed wc<>bed transfer in ADL apartment with stool for  boosting and simulated bed rail. Discussed logistics and concerns for discharge. Pt returned to room and was left with all needs in reach and wife present.  Therapy Documentation Precautions:  Precautions Precautions: Back,Fall,Other (comment) Precaution Booklet Issued: No Precaution Comments: paraparesis Required Braces or Orthoses: Spinal Brace Spinal Brace: Other (comment) Spinal Brace Comments: Figure 8 Brace Restrictions Weight Bearing Restrictions: No     Therapy/Group: Individual Therapy  Sharen Counter, SPT 08/27/2020, 10:09 AM

## 2020-08-27 NOTE — Progress Notes (Signed)
[name] successfully performed the enoxaparin injection after teaching was accomplished.

## 2020-08-28 DIAGNOSIS — M5104 Intervertebral disc disorders with myelopathy, thoracic region: Secondary | ICD-10-CM | POA: Diagnosis not present

## 2020-08-28 LAB — COMPREHENSIVE METABOLIC PANEL
ALT: 31 U/L (ref 0–44)
AST: 22 U/L (ref 15–41)
Albumin: 3 g/dL — ABNORMAL LOW (ref 3.5–5.0)
Alkaline Phosphatase: 92 U/L (ref 38–126)
Anion gap: 9 (ref 5–15)
BUN: 13 mg/dL (ref 8–23)
CO2: 27 mmol/L (ref 22–32)
Calcium: 9.1 mg/dL (ref 8.9–10.3)
Chloride: 102 mmol/L (ref 98–111)
Creatinine, Ser: 0.93 mg/dL (ref 0.61–1.24)
GFR, Estimated: >60 mL/min (ref >60)
Glucose, Bld: 94 mg/dL (ref 70–99)
Potassium: 3.8 mmol/L (ref 3.5–5.1)
Sodium: 138 mmol/L (ref 135–145)
Total Bilirubin: 0.7 mg/dL (ref 0.3–1.2)
Total Protein: 6 g/dL — ABNORMAL LOW (ref 6.5–8.1)

## 2020-08-28 LAB — CBC WITH DIFFERENTIAL/PLATELET
Abs Immature Granulocytes: 0.04 10*3/uL (ref 0.00–0.07)
Basophils Absolute: 0 10*3/uL (ref 0.0–0.1)
Basophils Relative: 1 %
Eosinophils Absolute: 0.3 10*3/uL (ref 0.0–0.5)
Eosinophils Relative: 4 %
HCT: 39.7 % (ref 39.0–52.0)
Hemoglobin: 12.1 g/dL — ABNORMAL LOW (ref 13.0–17.0)
Immature Granulocytes: 1 %
Lymphocytes Relative: 29 %
Lymphs Abs: 2.1 10*3/uL (ref 0.7–4.0)
MCH: 27.6 pg (ref 26.0–34.0)
MCHC: 30.5 g/dL (ref 30.0–36.0)
MCV: 90.4 fL (ref 80.0–100.0)
Monocytes Absolute: 0.8 10*3/uL (ref 0.1–1.0)
Monocytes Relative: 12 %
Neutro Abs: 3.9 10*3/uL (ref 1.7–7.7)
Neutrophils Relative %: 53 %
Platelets: 399 10*3/uL (ref 150–400)
RBC: 4.39 MIL/uL (ref 4.22–5.81)
RDW: 14 % (ref 11.5–15.5)
WBC: 7.2 10*3/uL (ref 4.0–10.5)
nRBC: 0 % (ref 0.0–0.2)

## 2020-08-28 MED ORDER — ENOXAPARIN SODIUM 40 MG/0.4ML ~~LOC~~ SOLN
40.0000 mg | SUBCUTANEOUS | Status: DC
  Administered 2020-08-29 – 2020-09-01 (×4): 40 mg via SUBCUTANEOUS
  Filled 2020-08-28 (×4): qty 0.4

## 2020-08-28 NOTE — Progress Notes (Incomplete)
Patient noted

## 2020-08-28 NOTE — Progress Notes (Signed)
Occupational Therapy Session Note  Patient Details  Name: Randall Mccormick MRN: [id number] Date of Birth: 05/31/1956  Today's Date: 08/28/2020 OT Individual Time: 1300-1330 OT Individual Time Calculation (min): 30 min    Short Term Goals: Week 5:  OT Short Term Goal 1 (Week 5): STGs=LTGs due to ELOS  Skilled Therapeutic Interventions/Progress Updates:    OT intervention with focus on sit<>stand and standing balance while engaged in functional tasks. Sit<>stand X 6 with CGA and mod verbal cues for technique. Standing at table to assemble/disassemble PVC pipe structure with rest breaks. Standing at table to place and removed checker tokens from board with rest breaks. Pt required unilateral UE support when standing and performing tasks. Pt returned to room and reamined in w/c with seat belt fastened. All needs within reach.  Therapy Documentation Precautions:  Precautions Precautions: Back,Fall,Other (comment) Precaution Booklet Issued: No Precaution Comments: paraparesis Required Braces or Orthoses: Spinal Brace Spinal Brace: Other (comment) Spinal Brace Comments: Figure 8 Brace Restrictions Weight Bearing Restrictions: No   Pain: Pt denies pain this afternoon  Therapy/Group: Individual Therapy  Leroy Libman 08/28/2020, 2:48 PM

## 2020-08-28 NOTE — Progress Notes (Signed)
Physical Therapy Session Note  Patient Details  Name: Randall Mccormick MRN: [id number] Date of Birth: 16-Mar-1956  Today's Date: 08/28/2020 PT Individual Time: 1100-1156, 1400-1430 PT Individual Time Calculation (min): 56 min, 30 min  Short Term Goals: Week 4:  PT Short Term Goal 1 (Week 4): Pt will perform stand pivot transfer with LRAD and min A x1 consistently. PT Short Term Goal 2 (Week 4): Pt will tolerate standing x 5 min with LRAD. PT Short Term Goal 3 (Week 4): Pt will propel wc x 500 ft with supervision.  Skilled Therapeutic Interventions/Progress Updates:    Session 1: Pt received seated in wc and agreeable to therapy, reports LE fatigue after yesterday's sessions. Pt denies pain. Pt propelled wc with BUE mod I to day room. STS min A to mod A at times when fatigued. Stand pivot transfer with min A for balance and power up to stand, VCs for anterior weight shift. Pt performed 2 sets of seated LAQ and marches. Mod A EOM<>supine to perform bridges 3 x 10 with assist to prevent hip ER, bent knee fall outs 2 x 10. Side steps x 6 ft with min A x 2 for safety. Pt performed 3 bouts of alternating high fives with therapist in standing with RW, UUE support, to improve standing tolerance and balance. Pt propelled wc back to room with BUE, mod I and remained seated in wc with all needs in reach.  Session 2: Pt seated in wc on arrival, agreeable to therapy but expressing extreme fatigue. Pt denies pain. Pt propelled wc 2 x 150 feet to/from therapy gym with BUE, mod I. Stand pivot transfer to mat table with min A and max VCs for technique. Pt continues to have difficulty with anterior weight shift. Trialed attaching dycem to foot plate to maintain foot position with minimal success. Pt returned to bed after session with min A stand pivot transfer with RW. Pt presents with L ankle DF AROM>PROM when testing for clonus/spasticity. Dependent brief change 2/2 urinary incontinence performed in supine, pt able to  roll with supervision to assist. Pt left in bed withh bed alarm active and all needs in reach.   Therapy Documentation Precautions:  Precautions Precautions: Back,Fall,Other (comment) Precaution Booklet Issued: No Precaution Comments: paraparesis Required Braces or Orthoses: Spinal Brace Spinal Brace: Other (comment) Spinal Brace Comments: Figure 8 Brace Restrictions Weight Bearing Restrictions: No General:     Therapy/Group: Individual Therapy  Sharen Counter, SPT 08/28/2020, 12:17 PM

## 2020-08-28 NOTE — Progress Notes (Signed)
Occupational Therapy Session Note  Patient Details  Name: Randall Mccormick MRN: [id number] Date of Birth: 1956/02/26  Today's Date: 08/28/2020 OT Individual Time: 0900-1000 OT Individual Time Calculation (min): 60 min    Short Term Goals: Week 5:  OT Short Term Goal 1 (Week 5): STGs=LTGs due to ELOS  Skilled Therapeutic Interventions/Progress Updates:    OT interventin with focus on bed mobility, sit<>stand in [place] and with RW, stand pivot transfers, bathing at shower level, dressing with sit<>stand from EOB with Stedy, discharge planning, and safety awareness to increase independence with BADLs. Supine>sit EOB with supervision. Sit<>stand in [place] with supervision. Bathing at shower level with min A for buttocks. Pt will not initially bathe at shower level at home 2/2 bathroom not accessible by w/c. Pt requires assistance with socks/shoes/AFOs. Pt using AE appropriately but hesitant to use sock aid. W/c mobiity with supervision. Pt remained in w/c with belt alarm activated and all needs within reach.   Therapy Documentation Precautions:  Precautions Precautions: Back,Fall,Other (comment) Precaution Booklet Issued: No Precaution Comments: paraparesis Required Braces or Orthoses: Spinal Brace Spinal Brace: Other (comment) Spinal Brace Comments: Figure 8 Brace Restrictions Weight Bearing Restrictions: No   Pain: Pain Assessment Pain Scale: 0-10 Pain Score: 2  Pain Location: Leg Pain Descriptors / Indicators: Aching Patients Stated Pain Goal: 2 Pain Intervention(s):premidicated  Therapy/Group: Individual Therapy  Leroy Libman 08/28/2020, 10:59 AM

## 2020-08-28 NOTE — Progress Notes (Signed)
Randall Mccormick PHYSICAL MEDICINE & REHABILITATION PROGRESS NOTE  Subjective/Complaints:   Pt reports walked 25 ft x2 then 30 ft with RW- said didn't use the lite gait? Also went outside in w/c.  Wife has done lovenox teaching- was successful.   Asking why getting lovenox BID  ROS:    Pt denies SOB, abd pain, CP, N/V/C/D, and vision changes   Objective: Vital Signs: Blood pressure 126/72, pulse 76, temperature 97.6 F (36.4 C), temperature source Oral, resp. rate 18, height 5\' 8"  (1.727 m), weight 103.6 kg, SpO2 93 %. No results found. Recent Labs    08/28/20 0557  WBC 7.2  HGB 12.1*  HCT 39.7  PLT 399   Recent Labs    08/28/20 0557  NA 138  K 3.8  CL 102  CO2 27  GLUCOSE 94  BUN 13  CREATININE 0.93  CALCIUM 9.1    Intake/Output Summary (Last 24 hours) at 08/28/2020 0826 Last data filed at 08/28/2020 0400 Gross per 24 hour  Intake 320 ml  Output 2175 ml  Net -1855 ml        Physical Exam: BP 126/72 (BP Location: Left Arm)   Pulse 76   Temp 97.6 F (36.4 C) (Oral)   Resp 18   Ht 5\' 8"  (1.727 m)   Wt 103.6 kg   SpO2 93%   BMI 34.73 kg/m    General: awake, alert, appropriate, NAD- just woke up, in bed supine- hasn't eaten breakfast yet HENT: conjugate gaze; oropharynx moist CV: regular rate and rhythm; no JVD Pulmonary: CTA B/L; no W/R/R- good air movement GI: soft, NT, ND, (+)BS Psychiatric: appropriate- more interactive Neurological: Ox3 Skin: intact Motor: Bilateral upper extremities: 5/5  Right lower extremity: Hip flexion, knee extension 2+/5, ankle dorsiflexion 4-/5, no change Left lower extremity: Hip flexion, knee extension 2/5, ankle dorsiflexion 4 -/5, no change  Sensation diminished sl to light touch bilateral lower extremities    Assessment/Plan: 1. Functional deficits which require 3+ hours per day of interdisciplinary therapy in a comprehensive inpatient rehab setting.  Physiatrist is providing close team supervision and 24 hour  management of active medical problems listed below.  Physiatrist and rehab team continue to assess barriers to discharge/monitor patient progress toward functional and medical goals   Care Tool:  Bathing    Body parts bathed by patient: Right arm,Left arm,Chest,Abdomen,Front perineal area,Right upper leg,Left upper leg,Face,Right lower leg,Left lower leg   Body parts bathed by helper: Buttocks     Bathing assist Assist Level: Minimal Assistance - Patient > 75%     Upper Body Dressing/Undressing Upper body dressing   What is the patient wearing?: Pull over shirt    Upper body assist Assist Level: Set up assist    Lower Body Dressing/Undressing Lower body dressing      What is the patient wearing?: Incontinence brief,Pants     Lower body assist Assist for lower body dressing: Moderate Assistance - Patient 50 - 74%     Toileting Toileting    Toileting assist Assist for toileting: Maximal Assistance - Patient 25 - 49% (bedlevel) Assistive Device Comment: pericare after urinal   Transfers Chair/bed transfer  Transfers assist     Chair/bed transfer assist level: Minimal Assistance - Patient > 75%     Locomotion Ambulation   Ambulation assist   Ambulation activity did not occur: Safety/medical concerns  Assist level: Moderate Assistance - Patient 50 - 74% Assistive device: Walker-rolling Max distance: 35   Walk 10 feet activity  Assist  Walk 10 feet activity did not occur: Safety/medical concerns  Assist level: Moderate Assistance - Patient - 50 - 74% Assistive device: Walker-rolling   Walk 50 feet activity   Assist Walk 50 feet with 2 turns activity did not occur: Safety/medical concerns         Walk 150 feet activity   Assist Walk 150 feet activity did not occur: Safety/medical concerns         Walk 10 feet on uneven surface  activity   Assist Walk 10 feet on uneven surfaces activity did not occur: Safety/medical concerns          Wheelchair     Assist Will patient use wheelchair at discharge?: Yes Type of Wheelchair: Manual    Wheelchair assist level: Independent Max wheelchair distance: 500+    Wheelchair 50 feet with 2 turns activity    Assist        Assist Level: Independent   Wheelchair 150 feet activity     Assist      Assist Level: Independent    Medical Problem List and Plan: 1.  Bilateral lower extremity weakness due to incomplete paraplegia, thoracic myelopathy   with sensory deficits, decreased endurance, poor awareness/lacks insight as well as pain affecting ADLs and mobility secondary to thoracic myelopathy/paraplegia with paraparesis.  Continue CIR PT, OT  2.  Antithrombotics: -DVT/anticoagulation:  Pharmaceutical: Lovenox    Lower extremity Dopplers ordered  Dopplers (-)- will continue Lovenox  3/1- will need Lovenox for 2 months total from surgery- will order Lovenox teaching.  3/3- double check that wife is taught Lovenox injections   3/7- will change Lovenox to Qday- don't see a reason was BID             -antiplatelet therapy: N/a 3. Pain Management: Continue oxycodone prn.              --neuropathy BLE and right chest wall-->continue gabapentin 600 mg tid for now.   2/7- will Add Duloxetine 30 mg QHS and decrease Prozac to 20 mg daily since needs help with nerve pain and already at 600 mg TID of gabapentin  2/16- will increase Oxycodone to 15 mg q4 hours prn   2/17- pain doing better- today- explained might have difficulty giving this much pain meds when leaves, due to  [employer] for first 7 days- will see what we can do.   2/25- pain controlled- will see if pain doing better, so can try to wean Oxy prn if possible  2/27- pain stable- will d/w pt Tuesday about trying to wean meds a little  3/1- pt doesn't want to wean meds as of yet- will d/w pt Thursday  3/3- reduced Oxy to 10-15 mg based on pain scores  3/7- hasn't c/o pain lately and taking 10 mg Oxy  usually- con't regimen 4. H/o anxiety/Mood: LCSW to follow for evaluation and support.              Prozaac  2/7- decrease to 20 mg daily so has Duloxetine  2/11- no change in affect  2/18- since on prozac, cannot increase Duloxetine  2/21- mood better last few days- con't regimen             -antipsychotic agents: Continue Zyprexa 5. Neuropsych: This patient is capable of making decisions on his own behalf. 6. Skin/Wound Care: Routine pressure relief measures. Hypoallergenic sheets.   Prevalon boots for both heels ordered 7. Fluids/Electrolytes/Nutrition: Monitor I/Os.    8. Neurogenic bladder:  UA unremarkable, urine culture pending  2/7- will recheck U/A and Cx due to incontinence AND leukocytosis  2/8- F/U U/A is strongly (+) for UTI- started Keflex 500 mg TID- awaiting Cx- will recheck CBC in AM- wait on stopping Flomax.  2/9- U Cx (+) for Ecoli- pansensitive- WBC down to 14k- will con't Keflex 500 mg TID for a total of 7 days.    2/10- labs in AM- CBC-diff and BMP to make sure things normalizing.  2/14- increased Flomax to 0.8 mg qevening- since required cath x1-   3/1- no caths- voiding OK- con't Flomax 0.8 mg nightly  3/2- stop Keflex for UTI 9. HTN: Monitor BP   Norvasc  Mildly elevated on 2/6, monitor for trend  2/28-systolic elevated last 2 reads but has mostly been stable-continue Norvasc  3/1- BP 150s/80s- this is unusual for pt- if no improvement in next 1-2 days, will increase Meds- will verify no orthostatic hypotension with walking.   3/2- BP much better today 122/86- won't make any BP med changes- no orthostasis per PT/OT with therapy.   3/7- BP well controlled at 120s/70s- con't regimen             Monitor with increased mobility 10. Neurogenic bowel:    KUB reviewed, unremarkable             Miralax daily and dulcolax supp w/dig stim after supper.   2/9- refused all bowel program x5+ days- explained to pt why he needs it- should be able to avoid "2 days of  stools", but the more he waits, the more chance he will have  A 1-2 day "blowout", which will be due to not emptying gut. He said he would try it.  2/10- refused bowel program again. Incontinent- doesn't understand can't control bowels.  2/13- pt continues to refuse bowel program. Had a frank conversation as to why we're doing it. Pt seemed to listen/understand. We'll see how it goes tonight  2/17- discussed bowel program and how it's to help reduce accidents over 3-6 weeks, not instantly- explained to pt/wife- can also order dig stim stick and suppository inserter as well- explained they are on [place] if wife wants to get them. Con't bowel program- had bowel accident last night- no BM with program.  2/27- pt isn't concerned about bowel accidents- won't do dig stim- con't suppository nightly  3/1- pt doing miralax and Suppository daily- still having some accidents per pt- con't regimen 11.  Hypoalbuminemia  Supplement initiated on 2/5 12.  Transaminitis  Resolved 13.  Leukocytosis  WBC 12.1 on 2/5, labs ordered for tomorrow  2/7- WBC 17.7!- will check U/A and Cx again AND CXR since WBC up so much- not on steroids. Afebrile and says he doesn't feel ill.   2/8- has UTI- denied Sx's of UTI, however has incontinence- Keflex 500 mg TID- will wait for Cx results.   2/9- Cx shows ECOLI- PAN-SENSITIVE- CON'T Keflex  2/11- Keflex x total of 7 days-   2/15- WBC 8.2- resolved  3/2- stop Keflex 14. Anemia: Hgb reviewed and low at 11.9, but improved from last read 15. Dispo Tent d/c 3/11  3/7- d/c Friday 3/11- is walking? Still needs Lovenox x total 3 months because not walking 150 ft at a time.       LOS: 31 days A FACE TO FACE EVALUATION WAS PERFORMED  Randall Mccormick 08/28/2020, 8:26 AM

## 2020-08-29 DIAGNOSIS — M5104 Intervertebral disc disorders with myelopathy, thoracic region: Secondary | ICD-10-CM | POA: Diagnosis not present

## 2020-08-29 MED ORDER — OXYCODONE HCL 5 MG PO TABS
5.0000 mg | ORAL_TABLET | ORAL | Status: DC | PRN
  Administered 2020-08-29 (×2): 5 mg via ORAL
  Filled 2020-08-29: qty 2
  Filled 2020-08-29: qty 1

## 2020-08-29 NOTE — Progress Notes (Signed)
Occupational Therapy Session Note  Patient Details  Name: Randall Mccormick MRN: [id number] Date of Birth: 10/27/1955  Today's Date: 08/29/2020 OT Individual Time: 0900-1000 OT Individual Time Calculation (min): 60 min    Short Term Goals: Week 5:  OT Short Term Goal 1 (Week 5): STGs=LTGs due to ELOS  Skilled Therapeutic Interventions/Progress Updates:    Pt resting in w/c upon arrival. Pt stated he had already donned clean clothing and was ready. Pt propelled w/c to gym and initially engaged in BUE therex on [employer] (7 mins random load 4). Pt stated he need to urinate and propelled w/c back to room. Pt unsuccessful but almost immediately after pulling pants up, pt incontinent of bladder (unaware). Pt stood X 3 for hygiene and doffing/donning clean brief and shorts. Pt CGA for hygiene but tot A for clothing. Sit<>stand with CGA and max verbal cues for technique. Pt propelled w/c to gym and engaged in standing activities tossing bean bags at target. Sit<>stand X 5 and standing for approx 60 secs. CGA for sit<>stand and standing with max verbal cues for technique. Pt returned to room and remained in w/c with seat belt secured. OT intervention with focus on w/c mobility, sit<>stand, and standing balance during functional tasks to increase independence with BADLs and prepare for discharge 3/11.  Therapy Documentation Precautions:  Precautions Precautions: Back,Fall,Other (comment) Precaution Booklet Issued: No Precaution Comments: paraparesis Required Braces or Orthoses: Spinal Brace Spinal Brace: Other (comment) Spinal Brace Comments: Figure 8 Brace Restrictions Weight Bearing Restrictions: No    Pain: Pain Assessment Pain Scale: 0-10 Pain Score: 4  Pain Location: Leg Pain Orientation: Left;Right Pain Descriptors / Indicators: Aching;Spasm Patients Stated Pain Goal: 2 Pain Intervention(s): Pt premedicated  Therapy/Group: Individual Therapy  Leroy Libman 08/29/2020, 10:11  AM

## 2020-08-29 NOTE — Progress Notes (Signed)
Patient ID: Randall Mccormick, male   DOB: July 08, 1955, 65 y.o.   MRN: [id number]  SW met with pt in room to provide updates from team conference on gains made, and d/c date remains 3/11/. SW informed on Lovenox injection only being once day now. Pt reports his wife will be here tomorrow morning at 10am with therapy to practice car transfers.   Loralee Pacas, MSW, [place] Office: [phone] Cell: [phone] Fax: [phone]

## 2020-08-29 NOTE — Progress Notes (Signed)
[place] PHYSICAL MEDICINE & REHABILITATION PROGRESS NOTE  Subjective/Complaints:   Pt reports getting another w/c eval today at 10am- needs to get different measurements.  B/B going well.  Have changed Lovenox to 1x/day-     ROS:   Pt denies SOB, abd pain, CP, N/V/C/D, and vision changes   Objective: Vital Signs: Blood pressure 123/69, pulse 82, temperature 98.2 F (36.8 C), resp. rate 18, height 5\' 8"  (1.727 m), weight 103.6 kg, SpO2 90 %. No results found. Recent Labs    08/28/20 0557  WBC 7.2  HGB 12.1*  HCT 39.7  PLT 399   Recent Labs    08/28/20 0557  NA 138  K 3.8  CL 102  CO2 27  GLUCOSE 94  BUN 13  CREATININE 0.93  CALCIUM 9.1    Intake/Output Summary (Last 24 hours) at 08/29/2020 0856 Last data filed at 08/29/2020 2956 Gross per 24 hour  Intake 1280 ml  Output 1650 ml  Net -370 ml        Physical Exam: BP 123/69 (BP Location: Left Arm)   Pulse 82   Temp 98.2 F (36.8 C)   Resp 18   Ht 5\' 8"  (1.727 m)   Wt 103.6 kg   SpO2 90%   BMI 34.73 kg/m     General: awake, alert, appropriate, NAD- sitting up, finished breakfast HENT: conjugate gaze; oropharynx moist CV: regular rate and rhythm; no JVD Pulmonary: CTA B/L; no W/R/R- good air movement GI: soft, NT, ND, (+)BS Psychiatric: appropriate- bright affect Neurological: Ox3 Skin: intact Motor: Bilateral upper extremities: 5/5  Right lower extremity: Hip flexion, knee extension 2+/5, ankle dorsiflexion 4-/5, no change Left lower extremity: Hip flexion, knee extension 2/5, ankle dorsiflexion 4 -/5, no change  Sensation diminished sl to light touch bilateral lower extremities    Assessment/Plan: 1. Functional deficits which require 3+ hours per day of interdisciplinary therapy in a comprehensive inpatient rehab setting.  Physiatrist is providing close team supervision and 24 hour management of active medical problems listed below.  Physiatrist and rehab team continue to assess  barriers to discharge/monitor patient progress toward functional and medical goals   Care Tool:  Bathing    Body parts bathed by patient: Right arm,Left arm,Chest,Abdomen,Front perineal area,Right upper leg,Left upper leg,Face,Right lower leg,Left lower leg   Body parts bathed by helper: Buttocks     Bathing assist Assist Level: Minimal Assistance - Patient > 75%     Upper Body Dressing/Undressing Upper body dressing   What is the patient wearing?: Pull over shirt    Upper body assist Assist Level: Set up assist    Lower Body Dressing/Undressing Lower body dressing      What is the patient wearing?: Incontinence brief,Pants     Lower body assist Assist for lower body dressing: Moderate Assistance - Patient 50 - 74%     Toileting Toileting    Toileting assist Assist for toileting: Maximal Assistance - Patient 25 - 49% (bedlevel) Assistive Device Comment: pericare after urinal   Transfers Chair/bed transfer  Transfers assist     Chair/bed transfer assist level: Minimal Assistance - Patient > 75%     Locomotion Ambulation   Ambulation assist   Ambulation activity did not occur: Safety/medical concerns  Assist level: Moderate Assistance - Patient 50 - 74% Assistive device: Walker-rolling Max distance: 35   Walk 10 feet activity   Assist  Walk 10 feet activity did not occur: Safety/medical concerns  Assist level: Moderate Assistance - Patient -  50 - 74% Assistive device: Walker-rolling   Walk 50 feet activity   Assist Walk 50 feet with 2 turns activity did not occur: Safety/medical concerns         Walk 150 feet activity   Assist Walk 150 feet activity did not occur: Safety/medical concerns         Walk 10 feet on uneven surface  activity   Assist Walk 10 feet on uneven surfaces activity did not occur: Safety/medical concerns         Wheelchair     Assist Will patient use wheelchair at discharge?: Yes Type of Wheelchair:  Manual    Wheelchair assist level: Independent Max wheelchair distance: 300    Wheelchair 50 feet with 2 turns activity    Assist        Assist Level: Independent   Wheelchair 150 feet activity     Assist      Assist Level: Independent    Medical Problem List and Plan: 1.  Bilateral lower extremity weakness due to incomplete paraplegia, thoracic myelopathy   with sensory deficits, decreased endurance, poor awareness/lacks insight as well as pain affecting ADLs and mobility secondary to thoracic myelopathy/paraplegia with paraparesis.  Continue CIR PT, OT   3/8- walked some short distances with RW mod A- doing better 2.  Antithrombotics: -DVT/anticoagulation:  Pharmaceutical: Lovenox    Lower extremity Dopplers ordered  Dopplers (-)- will continue Lovenox  3/1- will need Lovenox for 2-3 months total from surgery- will order Lovenox teaching.  3/3- double check that wife is taught Lovenox injections   3/7- will change Lovenox to Qday- don't see a reason was BID  3/8- pt happier with Qday dosing. Will need a total of 3 months at d/c.              -antiplatelet therapy: N/a 3. Pain Management: Continue oxycodone prn.              --neuropathy BLE and right chest wall-->continue gabapentin 600 mg tid for now.   2/7- will Add Duloxetine 30 mg QHS and decrease Prozac to 20 mg daily since needs help with nerve pain and already at 600 mg TID of gabapentin  2/16- will increase Oxycodone to 15 mg q4 hours prn   2/17- pain doing better- today- explained might have difficulty giving this much pain meds when leaves, due to  [employer] for first 7 days- will see what we can do.   2/25- pain controlled- will see if pain doing better, so can try to wean Oxy prn if possible  2/27- pain stable- will d/w pt Tuesday about trying to wean meds a little  3/1- pt doesn't want to wean meds as of yet- will d/w pt Thursday  3/3- reduced Oxy to 10-15 mg based on pain scores  3/7- hasn't c/o  pain lately and taking 10 mg Oxy usually- con't regimen  3/8- will see if can wean Oxy to 5-10 mg today.  4. H/o anxiety/Mood: LCSW to follow for evaluation and support.              Prozaac  2/7- decrease to 20 mg daily so has Duloxetine  2/11- no change in affect  2/18- since on prozac, cannot increase Duloxetine  2/21- mood better last few days- con't regimen             -antipsychotic agents: Continue Zyprexa 5. Neuropsych: This patient is capable of making decisions on his own behalf. 6. Skin/Wound Care: Routine pressure  relief measures. Hypoallergenic sheets.   Prevalon boots for both heels ordered 7. Fluids/Electrolytes/Nutrition: Monitor I/Os.    8. Neurogenic bladder:   UA unremarkable, urine culture pending  2/7- will recheck U/A and Cx due to incontinence AND leukocytosis  2/8- F/U U/A is strongly (+) for UTI- started Keflex 500 mg TID- awaiting Cx- will recheck CBC in AM- wait on stopping Flomax.  2/9- U Cx (+) for Ecoli- pansensitive- WBC down to 14k- will con't Keflex 500 mg TID for a total of 7 days.    2/10- labs in AM- CBC-diff and BMP to make sure things normalizing.  2/14- increased Flomax to 0.8 mg qevening- since required cath x1-   3/1- no caths- voiding OK- con't Flomax 0.8 mg nightly  3/2- stop Keflex for UTI 9. HTN: Monitor BP   Norvasc  Mildly elevated on 2/6, monitor for trend  2/28-systolic elevated last 2 reads but has mostly been stable-continue Norvasc  3/1- BP 150s/80s- this is unusual for pt- if no improvement in next 1-2 days, will increase Meds- will verify no orthostatic hypotension with walking.   3/2- BP much better today 122/86- won't make any BP med changes- no orthostasis per PT/OT with therapy.   3/8- BP well controlled 120s/60s- con't regimen             Monitor with increased mobility 10. Neurogenic bowel:    KUB reviewed, unremarkable             Miralax daily and dulcolax supp w/dig stim after supper.   2/9- refused all bowel program x5+  days- explained to pt why he needs it- should be able to avoid "2 days of stools", but the more he waits, the more chance he will have  A 1-2 day "blowout", which will be due to not emptying gut. He said he would try it.  2/10- refused bowel program again. Incontinent- doesn't understand can't control bowels.  2/13- pt continues to refuse bowel program. Had a frank conversation as to why we're doing it. Pt seemed to listen/understand. We'll see how it goes tonight  2/17- discussed bowel program and how it's to help reduce accidents over 3-6 weeks, not instantly- explained to pt/wife- can also order dig stim stick and suppository inserter as well- explained they are on [place] if wife wants to get them. Con't bowel program- had bowel accident last night- no BM with program.  2/27- pt isn't concerned about bowel accidents- won't do dig stim- con't suppository nightly  3/1- pt doing miralax and Suppository daily- still having some accidents per pt- con't regimen  3/8- says going regularly- still has some accidents- con't regimen- pt using suppository occ.  11.  Hypoalbuminemia  Supplement initiated on 2/5 12.  Transaminitis  Resolved 13.  Leukocytosis  WBC 12.1 on 2/5, labs ordered for tomorrow  2/7- WBC 17.7!- will check U/A and Cx again AND CXR since WBC up so much- not on steroids. Afebrile and says he doesn't feel ill.   2/8- has UTI- denied Sx's of UTI, however has incontinence- Keflex 500 mg TID- will wait for Cx results.   2/9- Cx shows ECOLI- PAN-SENSITIVE- CON'T Keflex  2/11- Keflex x total of 7 days-   2/15- WBC 8.2- resolved  3/2- stop Keflex 14. Anemia: Hgb reviewed and low at 11.9, but improved from last read 15. Dispo Tent d/c 3/11  3/7- d/c Friday 3/11- is walking? Still needs Lovenox x total 3 months because not walking 150 ft at a time.  LOS: 32 days A FACE TO FACE EVALUATION WAS PERFORMED  Megan Lovorn 08/29/2020, 8:56 AM

## 2020-08-29 NOTE — Patient Care Conference (Signed)
Inpatient RehabilitationTeam Conference and Plan of Care Update Date: 08/29/2020   Time: 11:01 AM    Patient Name: Randall Mccormick      Medical Record Number: [id number]  Date of Birth: 11/26/1955 Sex: Male         Room/Bed: 4M04C/4M04C-01 Payor Info: Payor: MEDICARE RAILROAD / Plan: MEDICARE RAILROAD / Product Type: *No Product type* /    Admit Date/Time:  07/28/2020  2:39 PM  Primary Diagnosis:  Thoracic disc disease with myelopathy  Hospital Problems: Principal Problem:   Thoracic disc disease with myelopathy Active Problems:   Constipation   Neurogenic bowel   Neurogenic bladder   Neuropathic pain   Chronic pain syndrome   Leukocytosis   Transaminitis   Hypoalbuminemia due to protein-calorie malnutrition [facility])    Expected Discharge Date: Expected Discharge Date: 09/01/20  Team Members Present: Physician leading conference: Dr. Courtney Heys Care Coodinator Present: Loralee Pacas, LCSWA;Khan Chura Creig Hines, RN, BSN, CRRN Nurse Present: Dorien Chihuahua, RN PT Present: Excell Seltzer, PT OT Present: Roanna Epley, COTA;Jennifer Tamala Julian, OT PPS Coordinator present : Gunnar Fusi, SLP     Current Status/Progress Goal Weekly Team Focus  Bowel/Bladder   incontinent at times. Last BM 3/7  Will remain continent and regular of bowel.  assess q shift and PRN   Swallow/Nutrition/ Hydration             ADL's   bathing at shower and sink level with min A; Stand pivot transfers-min A; LB dressing-mod A, UB dressing-supervision; toileting max A  min/mod A overall  bed mobility, sit<>stand, functional transfers, BADL training, activity tolerance, standing balance, discharge planning   Mobility   Supervision bed mobility, STS min A with RW, stand pivot with RW min A, wc moiblity 500+ feet, gait up to 35 ft with RW and min-mod A  min A overall, mod I wc mobility  Progressing gait, transfers, fam edu, strength and endurance   Communication             Safety/Cognition/ Behavioral  Observations            Pain   c/o pain 4 of 10 . pain mangment in progress, effective  pain<3  assess pain q shift and PRN   Skin   No skin issues  Skin will remain free breakdown.  assess skin q shift and PRN     Discharge Planning:  D/c to home with 24/7 care from pt wife. Pt will need to work on being able to perform self-care needs and becoming more self-sufficient. Family meeting held on 2/17 addressing pt care needs, and pt discharging at w/c level. Pt to [facility]   Team Discussion: Wife has been trained on Lovenox injection. Weaning Oxycodone to 5-10 mg. BP usually good. Giving up on a bowel program. Patient will just continue to have accidents. Patient incontinent of urine at HS, constipated and suppository given. Patient on target to meet rehab goals: Sit/stand with min assist. Lots of education with wife. Ambulating up to 35 ft. Without the Lite gait. Very set in his ways, working on standing balance. Per patient wife has a good grasp on his needs for discharge.  *See Care Plan and progress notes for long and short-term goals.   Revisions to Treatment Plan:  Weaning Oxy to 5-10 mg from 10-15 mg every 4 hours. Teaching Needs: Family education, medication management, pain management, bowel/bladder management, skin/wound care, transfer training, gait training, balance training, endurance training.  Current Barriers to Discharge: Inaccessible home  environment, Decreased caregiver support, Home enviroment access/layout, Incontinence, Neurogenic bowel and bladder, Lack of/limited family support, Weight, Weight bearing restrictions, Medication compliance and Behavior  Possible Resolutions to Barriers: Continue current medications, provide emotional support.     Medical Summary Current Status: inconitnent urine at night; urinal during day- used suppository yesterday-  Barriers to Discharge: Behavior;Decreased family/caregiver support;Home enviroment  access/layout;Incontinence;Neurogenic Bowel & Bladder;Weight bearing restrictions;Medication compliance  Barriers to Discharge Comments: won't do bowel program Possible Resolutions to Barriers/Weekly Focus: weaning Oxy to 5-10 mg from 10-15 mg q4 hours- con't robaxin prn; still incontinent stools occ- w/c eval today; add mild laxative due to constipation- refused bowel program. lovenox training done.  d/c 3/11   Continued Need for Acute Rehabilitation Level of Care: The patient requires daily medical management by a physician with specialized training in physical medicine and rehabilitation for the following reasons: Direction of a multidisciplinary physical rehabilitation program to maximize functional independence : Yes Medical management of patient stability for increased activity during participation in an intensive rehabilitation regime.: Yes Analysis of laboratory values and/or radiology reports with any subsequent need for medication adjustment and/or medical intervention. : Yes   I attest that I was present, lead the team conference, and concur with the assessment and plan of the team.   Cristi Loron 08/29/2020, 3:47 PM

## 2020-08-29 NOTE — Progress Notes (Signed)
Physical Therapy Session Note  Patient Details  Name: Randall Mccormick MRN: [id number] Date of Birth: 08/26/1955  Today's Date: 08/29/2020 PT Individual Time: 1130-1200 PT Individual Time Calculation (min): 30 min   Short Term Goals: Week 1:  PT Short Term Goal 1 (Week 1): Pt will be max A for transfers with LRAD PT Short Term Goal 1 - Progress (Week 1): Progressing toward goal PT Short Term Goal 2 (Week 1): Pt will be able to maintain sitting balance with mod A for 5 min PT Short Term Goal 2 - Progress (Week 1): Met PT Short Term Goal 3 (Week 1): Pt will be able to propel wc 50 ft with supervision. PT Short Term Goal 3 - Progress (Week 1): Met Week 2:  PT Short Term Goal 1 (Week 2): Pt will perform least restrictive transfer with max A PT Short Term Goal 1 - Progress (Week 2): Met PT Short Term Goal 2 (Week 2): Pt will tolerate standing x 5 min with LRAD PT Short Term Goal 2 - Progress (Week 2): Progressing toward goal PT Short Term Goal 3 (Week 2): Pt will propel w/c x 150 ft with min A PT Short Term Goal 3 - Progress (Week 2): Met PT Short Term Goal 4 (Week 2): Pt will maintain dynamic sitting balance x 5 min with min A PT Short Term Goal 4 - Progress (Week 2): Met Week 3:  PT Short Term Goal 1 (Week 3): Pt will perform least restrictive transfer with mod A consistently. PT Short Term Goal 1 - Progress (Week 3): Met PT Short Term Goal 2 (Week 3): Pt will tolerate standing x 5 min with LRAD. PT Short Term Goal 2 - Progress (Week 3): Not met PT Short Term Goal 3 (Week 3): Pt will propel wc x 300 ft with supervision. PT Short Term Goal 3 - Progress (Week 3): Met  Skilled Therapeutic Interventions/Progress Updates:    Pain:  Pt reports minimal back and knee pain.  Treatment to tolerance.  Rest breaks and repositioning as needed.  Pt initially oob in wc w/wife in room and agreeable to treatment session w/focus on family training, gait, functional strengthening. wc propulsion mod I  >163f. Wife instructed w/operation of wc brakes and safety w/wc follow. Pt refused AFOs stating that his foot has been sliding out of the shoe/AFO and that he last walked without them. Sit to stand from wc w/mod assist, cues for ant wt shift w/transition, cues to rully achieve upright posture. Gait 254fw/+[id number] for safety, cues for L foot placement, poor clearance LLE due to footdrop, frequent cues for upright posture/tends to progressively flex trunk/hips due to weakness.  Sit to stand x 4 from wc w/emphasis on mechanics, stability w/transition, achieving full upright.  At end of session pt transported back to room, left oob in wc w/needs in reach, wife at side.   Therapy Documentation Precautions:  Precautions Precautions: Back,Fall,Other (comment) Precaution Booklet Issued: No Precaution Comments: paraparesis Required Braces or Orthoses: Spinal Brace Spinal Brace: Other (comment) Spinal Brace Comments: Figure 8 Brace Restrictions Weight Bearing Restrictions: No    Therapy/Group: Individual Therapy  BarCallie FieldingT Clinton8/2022, 12:17 PM

## 2020-08-29 NOTE — Progress Notes (Addendum)
Physical Therapy Session Note  Patient Details  Name: Randall Mccormick MRN: [id number] Date of Birth: March 02, 1956  Today's Date: 08/29/2020 PT Individual Time: 1002-1100 PT Individual Time Calculation (min): 58 min   Short Term Goals: Week 4:  PT Short Term Goal 1 (Week 4): Pt will perform stand pivot transfer with LRAD and min A x1 consistently. PT Short Term Goal 2 (Week 4): Pt will tolerate standing x 5 min with LRAD. PT Short Term Goal 3 (Week 4): Pt will propel wc x 500 ft with supervision.  Skilled Therapeutic Interventions/Progress Updates:    Pt received seated in w/c in room with wife present, agreeable to PT session. No complaints of pain. Deatra Ina, ATP present during session to continue discussion regarding custom ultra lightweight manual wheelchair. Pt and his wife able to state preferences regarding chair frame type, armrest preference, ease of transport, weight of chair, and footplate type. Pt and his wife continue to agree that rigid frame best fit for patient at this time. Pt is mod I for w/c mobility to/from therapy gym with use of BUE. Reviewed how to assist pt with stand pivot transfer w/c to/from mat table with min A. Pt's wife able to perform return demonstration with min A needed due to pt requiring ongoing cues for weight shift forwards and upright posture during transfer. Discussed stance of caregiver during transfer and to verbally assist pt with foot placement during transfer. Demonstrated how to break down rigid frame loaner chair for ease of transport with regards to removing chair cushion, folding down chair back, and removing wheels. Pt's wife able to perform return demonstration of breaking down chair. Pt left seated in w/c in room with needs in reach, wife present at end of session.  Therapy Documentation Precautions:  Precautions Precautions: Back,Fall,Other (comment) Precaution Booklet Issued: No Precaution Comments: paraparesis Required Braces or Orthoses:  Spinal Brace Spinal Brace: Other (comment) Spinal Brace Comments: Figure 8 Brace Restrictions Weight Bearing Restrictions: No    Therapy/Group: Individual Therapy   Excell Seltzer, PT, DPT  08/29/2020, 12:07 PM

## 2020-08-29 NOTE — Progress Notes (Signed)
Physical Therapy Session Note  Patient Details  Name: Raihan Kimmel MRN: [id number] Date of Birth: Jan 01, 1956  Today's Date: 08/29/2020 PT Individual Time: 6286-3817 PT Individual Time Calculation (min): 45 min   Short Term Goals: Week 1:  PT Short Term Goal 1 (Week 1): Pt will be max A for transfers with LRAD PT Short Term Goal 1 - Progress (Week 1): Progressing toward goal PT Short Term Goal 2 (Week 1): Pt will be able to maintain sitting balance with mod A for 5 min PT Short Term Goal 2 - Progress (Week 1): Met PT Short Term Goal 3 (Week 1): Pt will be able to propel wc 50 ft with supervision. PT Short Term Goal 3 - Progress (Week 1): Met Week 2:  PT Short Term Goal 1 (Week 2): Pt will perform least restrictive transfer with max A PT Short Term Goal 1 - Progress (Week 2): Met PT Short Term Goal 2 (Week 2): Pt will tolerate standing x 5 min with LRAD PT Short Term Goal 2 - Progress (Week 2): Progressing toward goal PT Short Term Goal 3 (Week 2): Pt will propel w/c x 150 ft with min A PT Short Term Goal 3 - Progress (Week 2): Met PT Short Term Goal 4 (Week 2): Pt will maintain dynamic sitting balance x 5 min with min A PT Short Term Goal 4 - Progress (Week 2): Met Week 3:  PT Short Term Goal 1 (Week 3): Pt will perform least restrictive transfer with mod A consistently. PT Short Term Goal 1 - Progress (Week 3): Met PT Short Term Goal 2 (Week 3): Pt will tolerate standing x 5 min with LRAD. PT Short Term Goal 2 - Progress (Week 3): Not met PT Short Term Goal 3 (Week 3): Pt will propel wc x 300 ft with supervision. PT Short Term Goal 3 - Progress (Week 3): Met Week 4:  PT Short Term Goal 1 (Week 4): Pt will perform stand pivot transfer with LRAD and min A x1 consistently. PT Short Term Goal 2 (Week 4): Pt will tolerate standing x 5 min with LRAD. PT Short Term Goal 3 (Week 4): Pt will propel wc x 500 ft with supervision.  Skilled Therapeutic Interventions/Progress Updates:    pt  received in [facility] and agreeable to therapy however reported he felt exhausted and pain in BLE 9/10. Pt was agreeable to attempting therapy if he could do limited standing 2/2 pain. Pt directed in WC mobility 200' off unit to elevator and downstairs outside for improved I with changes of surfaces with WC mob. Nursing aware and agreeable. Pt completed 2x250' with supervision on various grades of incline/decline on concrete. Pt then returned inside, PT assisting with mobility 2/2 fatigue. Pt then returned to room, supervision 150' and requested to stand at stedy to urinate, min A to complete this. Pt directed in Sit to stand to Rolling walker from WC min A and Stand pivot transfer to bed min A with Rolling walker and CGA sit>supine. Pt left in bed alarm set, All needs in reach and in good condition. Call light in hand.  Pt missed 15 mins PT total.   Therapy Documentation Precautions:  Precautions Precautions: Back,Fall,Other (comment) Precaution Booklet Issued: No Precaution Comments: paraparesis Required Braces or Orthoses: Spinal Brace Spinal Brace: Other (comment) Spinal Brace Comments: Figure 8 Brace Restrictions Weight Bearing Restrictions: No General: PT Amount of Missed Time (min): 15 Minutes PT Missed Treatment Reason: Patient fatigue;Pain Vital Signs: Therapy Vitals Temp: 98.7 F (  37.1 C) Pulse Rate: 91 Resp: 18 BP: 125/73 Patient Position (if appropriate): Sitting Oxygen Therapy SpO2: 95 % O2 Device: Room Air Pain: Pain Assessment Pain Scale: 0-10 Pain Score: 5  Pain Location: Leg Pain Orientation: Right;Left Pain Descriptors / Indicators: Aching Patients Stated Pain Goal: 3 Pain Intervention(s): Medication (See eMAR) Mobility:   Locomotion :    Trunk/Postural Assessment :    Balance:   Exercises:   Other Treatments:      Therapy/Group: Individual Therapy  Junie Panning 08/29/2020, 3:45 PM

## 2020-08-30 DIAGNOSIS — M5104 Intervertebral disc disorders with myelopathy, thoracic region: Secondary | ICD-10-CM | POA: Diagnosis not present

## 2020-08-30 NOTE — Progress Notes (Signed)
Inpatient Rehabilitation Care Coordinator Discharge Note  The overall goal for the admission was met for:   Discharge location: Yes. D/c to home with wife who will provide 24/7 care.   Length of Stay: Yes. 34 days.   Discharge activity level: Yes. Minimal Assist.  Home/community participation: Yes. Limited.   Services provided included: MD, RD, PT, OT, RN, CM, TR, Pharmacy, Neuropsych and SW  Financial Services: Medicare  Choices offered to/list presented to:Yes  Follow-up services arranged: Home Health: [facility] for HHPT/OT/aide and DME: speciality w/c with [place] (or additional information):  Patient/Family verbalized understanding of follow-up arrangements: Yes  Individual responsible for coordination of the follow-up plan: Contact pt [phone] or pt wife [name] #[phone]  Confirmed correct DME delivered: Rana Snare 08/30/2020    Rana Snare

## 2020-08-30 NOTE — Progress Notes (Signed)
Occupational Therapy Session Note  Patient Details  Name: Randall Mccormick MRN: [id number] Date of Birth: 1955/12/04  Today's Date: 08/30/2020 OT Individual Time: 7158-0638 OT Individual Time Calculation (min): 38 min    Short Term Goals: Week 5:  OT Short Term Goal 1 (Week 5): STGs=LTGs due to ELOS  Skilled Therapeutic Interventions/Progress Updates:    Pt resting in w/c upon arrival. OT intervention with focus w/c mobility, BUE therex, sit<>stand, bed mobility, standing balance, and activity tolerance to increase independence with BADLs. W/c mobility with supervision. BUE therex on [employer]-7 mins random load 4. Pt requested to use bathroom and propelled w/c back to room. Sit<>stand X 2 in [place]. Pt stood with Stedy to void in urinal. Pt performed clothing management standing in [place]. Pt transferred to bed. Sit>supine with supervision. Pt remained in bed with all needs within reach and bed alarm activated.   Therapy Documentation Precautions:  Precautions Precautions: Back,Fall,Other (comment) Precaution Booklet Issued: No Precaution Comments: paraparesis Required Braces or Orthoses: Spinal Brace Spinal Brace: Other (comment) Spinal Brace Comments: Figure 8 Brace Restrictions Weight Bearing Restrictions: No  Pain:  Pt denies pain Therapy/Group: Individual Therapy  Leroy Libman 08/30/2020, 1:53 PM

## 2020-08-30 NOTE — Progress Notes (Signed)
[place] PHYSICAL MEDICINE & REHABILITATION PROGRESS NOTE  Subjective/Complaints:   Pt reports has 2 BMs yesterday after suppository the night prior.  Also, plans for car transfers today with wife, and PT.     ROS:    Pt denies SOB, abd pain, CP, N/V/C/D, and vision changes   Objective: Vital Signs: Blood pressure 114/64, pulse 98, temperature 98.2 F (36.8 C), resp. rate 18, height 5\' 8"  (1.727 m), weight 103.6 kg, SpO2 98 %. No results found. Recent Labs    08/28/20 0557  WBC 7.2  HGB 12.1*  HCT 39.7  PLT 399   Recent Labs    08/28/20 0557  NA 138  K 3.8  CL 102  CO2 27  GLUCOSE 94  BUN 13  CREATININE 0.93  CALCIUM 9.1    Intake/Output Summary (Last 24 hours) at 08/30/2020 0816 Last data filed at 08/30/2020 0742 Gross per 24 hour  Intake 876 ml  Output 1425 ml  Net -549 ml        Physical Exam: BP 114/64 (BP Location: Left Arm)   Pulse 98   Temp 98.2 F (36.8 C)   Resp 18   Ht 5\' 8"  (1.727 m)   Wt 103.6 kg   SpO2 98%   BMI 34.73 kg/m      General: awake, alert, appropriate, NAD- finished breakfast, sitting up watching TV HENT: conjugate gaze; oropharynx moist CV: regular rate and rhythm; no JVD Pulmonary: CTA B/L; no W/R/R- good air movement GI: soft, NT, ND, (+)BS- normoactive BS Psychiatric: appropriate Neurological: Ox3; MAS of 1 in LEs Skin: intact Motor: Bilateral upper extremities: 5/5  Right lower extremity: Hip flexion, knee extension 2+/5, ankle dorsiflexion 4-/5, no change Left lower extremity: Hip flexion, knee extension 2/5, ankle dorsiflexion 4 -/5, no change  Sensation diminished sl to light touch bilateral lower extremities    Assessment/Plan: 1. Functional deficits which require 3+ hours per day of interdisciplinary therapy in a comprehensive inpatient rehab setting.  Physiatrist is providing close team supervision and 24 hour management of active medical problems listed below.  Physiatrist and rehab team continue  to assess barriers to discharge/monitor patient progress toward functional and medical goals   Care Tool:  Bathing    Body parts bathed by patient: Right arm,Left arm,Chest,Abdomen,Front perineal area,Right upper leg,Left upper leg,Face,Right lower leg,Left lower leg   Body parts bathed by helper: Buttocks     Bathing assist Assist Level: Minimal Assistance - Patient > 75%     Upper Body Dressing/Undressing Upper body dressing   What is the patient wearing?: Pull over shirt    Upper body assist Assist Level: Set up assist    Lower Body Dressing/Undressing Lower body dressing      What is the patient wearing?: Incontinence brief,Pants     Lower body assist Assist for lower body dressing: Moderate Assistance - Patient 50 - 74%     Toileting Toileting    Toileting assist Assist for toileting: Maximal Assistance - Patient 25 - 49% (bedlevel) Assistive Device Comment: pericare after urinal   Transfers Chair/bed transfer  Transfers assist     Chair/bed transfer assist level: Minimal Assistance - Patient > 75%     Locomotion Ambulation   Ambulation assist   Ambulation activity did not occur: Safety/medical concerns  Assist level: 2 helpers Assistive device: Walker-rolling Max distance: 25   Walk 10 feet activity   Assist  Walk 10 feet activity did not occur: Safety/medical concerns  Assist level: 2 helpers Assistive  device: Walker-rolling   Walk 50 feet activity   Assist Walk 50 feet with 2 turns activity did not occur: Safety/medical concerns         Walk 150 feet activity   Assist Walk 150 feet activity did not occur: Safety/medical concerns         Walk 10 feet on uneven surface  activity   Assist Walk 10 feet on uneven surfaces activity did not occur: Safety/medical concerns         Wheelchair     Assist Will patient use wheelchair at discharge?: Yes Type of Wheelchair: Manual    Wheelchair assist level:  Independent Max wheelchair distance: 300    Wheelchair 50 feet with 2 turns activity    Assist        Assist Level: Independent   Wheelchair 150 feet activity     Assist      Assist Level: Independent    Medical Problem List and Plan: 1.  Bilateral lower extremity weakness due to incomplete paraplegia, thoracic myelopathy   with sensory deficits, decreased endurance, poor awareness/lacks insight as well as pain affecting ADLs and mobility secondary to thoracic myelopathy/paraplegia with paraparesis.  Continue CIR PT, OT   3/8- walked some short distances with RW mod A- doing better  3/9- working on car transfers today if doesn't rain on him.  2.  Antithrombotics: -DVT/anticoagulation:  Pharmaceutical: Lovenox    Lower extremity Dopplers ordered  Dopplers (-)- will continue Lovenox  3/1- will need Lovenox for 2-3 months total from surgery- will order Lovenox teaching.  3/3- double check that wife is taught Lovenox injections   3/7- will change Lovenox to Qday- don't see a reason was BID  3/8- pt happier with Qday dosing. Will need a total of 3 months at d/c.              -antiplatelet therapy: N/a 3. Pain Management: Continue oxycodone prn.              --neuropathy BLE and right chest wall-->continue gabapentin 600 mg tid for now.   2/7- will Add Duloxetine 30 mg QHS and decrease Prozac to 20 mg daily since needs help with nerve pain and already at 600 mg TID of gabapentin  2/16- will increase Oxycodone to 15 mg q4 hours prn   2/17- pain doing better- today- explained might have difficulty giving this much pain meds when leaves, due to  [employer] for first 7 days- will see what we can do.   3/3- reduced Oxy to 10-15 mg based on pain scores  3/7- hasn't c/o pain lately and taking 10 mg Oxy usually- con't regimen  3/8- will see if can wean Oxy to 5-10 mg today.   3/9- had tolerated change to 5-10 mg- don't think will be able to wean down more before d/c- will look at  stopping long acting at f/u.  4. H/o anxiety/Mood: LCSW to follow for evaluation and support.              Prozaac  2/7- decrease to 20 mg daily so has Duloxetine  2/11- no change in affect  2/18- since on prozac, cannot increase Duloxetine  2/21- mood better last few days- con't regimen             -antipsychotic agents: Continue Zyprexa 5. Neuropsych: This patient is capable of making decisions on his own behalf. 6. Skin/Wound Care: Routine pressure relief measures. Hypoallergenic sheets.   Prevalon boots for both heels ordered  7. Fluids/Electrolytes/Nutrition: Monitor I/Os.    8. Neurogenic bladder:   UA unremarkable, urine culture pending  2/7- will recheck U/A and Cx due to incontinence AND leukocytosis  2/8- F/U U/A is strongly (+) for UTI- started Keflex 500 mg TID- awaiting Cx- will recheck CBC in AM- wait on stopping Flomax.  2/9- U Cx (+) for Ecoli- pansensitive- WBC down to 14k- will con't Keflex 500 mg TID for a total of 7 days.    2/10- labs in AM- CBC-diff and BMP to make sure things normalizing.  2/14- increased Flomax to 0.8 mg qevening- since required cath x1-   3/1- no caths- voiding OK- con't Flomax 0.8 mg nightly  3/2- stop Keflex for UTI 9. HTN: Monitor BP   Norvasc  Mildly elevated on 2/6, monitor for trend  2/28-systolic elevated last 2 reads but has mostly been stable-continue Norvasc  3/1- BP 150s/80s- this is unusual for pt- if no improvement in next 1-2 days, will increase Meds- will verify no orthostatic hypotension with walking.   3/2- BP much better today 122/86- won't make any BP med changes- no orthostasis per PT/OT with therapy.   3/9- BP controlled 114/64- con't regimen             Monitor with increased mobility 10. Neurogenic bowel:    KUB reviewed, unremarkable             Miralax daily and dulcolax supp w/dig stim after supper.   2/9- refused all bowel program x5+ days- explained to pt why he needs it- should be able to avoid "2 days of stools",  but the more he waits, the more chance he will have  A 1-2 day "blowout", which will be due to not emptying gut. He said he would try it.  2/10- refused bowel program again. Incontinent- doesn't understand can't control bowels.  2/13- pt continues to refuse bowel program. Had a frank conversation as to why we're doing it. Pt seemed to listen/understand. We'll see how it goes tonight  2/17- discussed bowel program and how it's to help reduce accidents over 3-6 weeks, not instantly- explained to pt/wife- can also order dig stim stick and suppository inserter as well- explained they are on [place] if wife wants to get them. Con't bowel program- had bowel accident last night- no BM with program.  2/27- pt isn't concerned about bowel accidents- won't do dig stim- con't suppository nightly  3/1- pt doing miralax and Suppository daily- still having some accidents per pt- con't regimen  3/9- used suppository 2 nights ago- had 2 BMs yesterday- incontinent- doesn't appear to care doesn't have control.  11.  Hypoalbuminemia  Supplement initiated on 2/5 12.  Transaminitis  Resolved 13.  Leukocytosis  WBC 12.1 on 2/5, labs ordered for tomorrow  2/7- WBC 17.7!- will check U/A and Cx again AND CXR since WBC up so much- not on steroids. Afebrile and says he doesn't feel ill.   2/8- has UTI- denied Sx's of UTI, however has incontinence- Keflex 500 mg TID- will wait for Cx results.   2/9- Cx shows ECOLI- PAN-SENSITIVE- CON'T Keflex  2/11- Keflex x total of 7 days-   2/15- WBC 8.2- resolved  3/2- stop Keflex 14. Anemia: Hgb reviewed and low at 11.9, but improved from last read 15. Dispo Tent d/c 3/11  3/7- d/c Friday 3/11- is walking? Still needs Lovenox x total 3 months because not walking 150 ft at a time.       LOS: 33 days  A FACE TO FACE EVALUATION WAS PERFORMED  Megan Lovorn 08/30/2020, 8:16 AM

## 2020-08-30 NOTE — Progress Notes (Signed)
Occupational Therapy Session Note  Patient Details  Name: Randall Mccormick MRN: [id number] Date of Birth: [birth year]  Today's Date: 08/30/2020 OT Individual Time: 1100-1200 OT Individual Time Calculation (min): 60 min    Short Term Goals: Week 5:  OT Short Term Goal 1 (Week 5): STGs=LTGs due to ELOS  Skilled Therapeutic Interventions/Progress Updates:    Pt resting in w/c upon arrival with wife present for family education. Discussed assist levels for bathing/dressing. Pt's bathroom is not w/c accessible and recommended pt not amb with RW into bathroom until such time as [facility] therapy makes recommendation based on pt's progress. Pt and wife verbalized understanding. Pt practiced w/c<>BSC tranfsers with wife providing assistance. Pt's wife educated on use of gait belt. Pt practiced transfers with [facility] in two different configurations. Encouraged establishing daily schedule. Pt and wife verbalized understanding. Emphasized safety over speed. Discussed use of AE for dressing/bathing tasks. Pt returned to room and remained in w/c with all needs within reach. Wife present.   Therapy Documentation Precautions:  Precautions Precautions: Back,Fall,Other (comment) Precaution Booklet Issued: No Precaution Comments: paraparesis Required Braces or Orthoses: Spinal Brace Spinal Brace: Other (comment) Spinal Brace Comments: Figure 8 Brace Restrictions Weight Bearing Restrictions: No   Pain:  Pt denies pain this morning   Therapy/Group: Individual Therapy  Leroy Libman 08/30/2020, 12:19 PM

## 2020-08-30 NOTE — Progress Notes (Signed)
Physical Therapy Session Note  Patient Details  Name: Randall Mccormick MRN: [id number] Date of Birth: Oct 25, 1955  Today's Date: 08/30/2020 PT Individual Time: 1000-1100 PT Individual Time Calculation (min): 60 min   Short Term Goals: Week 4:  PT Short Term Goal 1 (Week 4): Pt will perform stand pivot transfer with LRAD and min A x1 consistently. PT Short Term Goal 2 (Week 4): Pt will tolerate standing x 5 min with LRAD. PT Short Term Goal 3 (Week 4): Pt will propel wc x 500 ft with supervision.  Skilled Therapeutic Interventions/Progress Updates:    Pt received seated in wc and agreeable to therapy to complete family education. Pt transported to front entrance for car transfer. Pt performed car transfer x 3 from wc with RW and min A with SPT and spouse. Pt and spouse were educated on safety and technique to perform car transfer. Pt propelled wc with BUE mod I x~300 ft. Gait x 20 feet with min A and wc follow.  Pt requested to use toilet. Stedy transfer to commode with CGA to stand in stedy. Pt stood for several minutes for dependent hygiene and clothing change. Pt left seated in wc with spouse present.  Therapy Documentation Precautions:  Precautions Precautions: Back,Fall,Other (comment) Precaution Booklet Issued: No Precaution Comments: paraparesis Required Braces or Orthoses: Spinal Brace Spinal Brace: Other (comment) Spinal Brace Comments: Figure 8 Brace Restrictions Weight Bearing Restrictions: No    Therapy/Group: Individual Therapy  Sharen Counter 08/30/2020, 12:28 PM

## 2020-08-30 NOTE — Progress Notes (Signed)
Physical Therapy Discharge Summary  Patient Details  Name: Randall Mccormick MRN: [id number] Date of Birth: 06/30/1955  Today's Date: 08/31/2020   Patient has met 7 of 7 long term goals due to improved activity tolerance, improved balance, improved postural control, increased strength, decreased pain and ability to compensate for deficits.  Patient to discharge at a wheelchair level [place].   Patient's care partner is independent to provide the necessary physical assistance at discharge. Pt's wife, [name], completed hands on family training and is capable of providing safe and appropriate assistance when needed. Pt will d/c with loaner w/c from [employer] until custom chair is delivered.  Reasons goals not met: N/A  Recommendation:  Patient will benefit from ongoing skilled PT services in home health setting to continue to advance safe functional mobility, address ongoing impairments in LE weakness, decreased endurance, decreased functional mobility, and minimize fall risk.  Equipment: Specialty w/c through [facility]  Reasons for discharge: treatment goals met and discharge from hospital  Patient/family agrees with progress made and goals achieved: Yes  PT Discharge Precautions/Restrictions Precautions Precautions: Back;Fall;Other (comment) Precaution Booklet Issued: No Precaution Comments: paraparesis Restrictions Weight Bearing Restrictions: No Vital Signs Therapy Vitals Temp: 98.1 F (36.7 C) Temp Source: Oral Pulse Rate: 88 Resp: 16 BP: 132/67 Patient Position (if appropriate): Sitting Oxygen Therapy SpO2: 97 % O2 Device: Room Air Pain   Vision/Perception  Vision - History Baseline Vision: Wears glasses only for reading Perception Perception: Within Functional Limits Praxis Praxis: Intact  Cognition Overall Cognitive Status: Within Functional Limits for tasks assessed Arousal/Alertness: Awake/alert Orientation Level: Oriented X4 Attention:  Focused;Sustained Focused Attention: Appears intact Sustained Attention: Appears intact Memory: Appears intact Awareness: Appears intact Problem Solving: Appears intact Sensation Sensation Light Touch: Impaired Detail Light Touch Impaired Details: Impaired RLE;Impaired LLE Coordination Gross Motor Movements are Fluid and Coordinated: No Fine Motor Movements are Fluid and Coordinated: Yes Coordination and Movement Description: Coordination affected by paraparesis Motor  Motor Motor: Paraplegia;Abnormal postural alignment and control;Abnormal tone Motor - Skilled Clinical Observations: paraparesis  Mobility Bed Mobility Bed Mobility: Rolling Right;Rolling Left;Supine to Sit;Sitting - Scoot to Edge of Bed Rolling Right: Independent with assistive device Rolling Left: Independent with assistive device Supine to Sit: Independent with assistive device Sitting - Scoot to Edge of Bed: Supervision/Verbal cueing Transfers Transfers: Risk manager;Sit to Stand;Stand to Sit Sit to Stand: Minimal Assistance - Patient > 75% Stand to Sit: Minimal Assistance - Patient > 75% Stand Pivot Transfers: Minimal Assistance - Patient > 75% Stand Pivot Transfer Details: Verbal cues for sequencing;Verbal cues for technique;Verbal cues for safe use of DME/AE;Verbal cues for gait pattern Transfer (Assistive device): Rolling walker Locomotion  Gait Ambulation: Yes Gait Assistance: Minimal Assistance - Patient > 75%;2 Helpers (wc follow) Gait Distance (Feet): 35 Feet Assistive device: Rolling walker Gait Assistance Details: Verbal cues for safe use of DME/AE;Verbal cues for gait pattern;Verbal cues for precautions/safety;Verbal cues for technique;Verbal cues for sequencing;Tactile cues for weight shifting Gait Assistance Details: wc follow Gait Gait: Yes Gait Pattern: Impaired Gait Pattern: Decreased stride length;Right genu recurvatum;Left genu recurvatum;Scissoring;Trunk flexed;Narrow base of  support;Poor foot clearance - left;Poor foot clearance - right Gait velocity: decreased Stairs / Additional Locomotion Stairs: No Architect: Yes Wheelchair Assistance: Independent with Camera operator: Both upper extremities Wheelchair Parts Management: Independent Distance: 500+  Trunk/Postural Assessment  Cervical Assessment Cervical Assessment: Within Functional Limits Thoracic Assessment Thoracic Assessment: Exceptions to [facility] Lumbar Assessment Lumbar Assessment:  (Back precautions) Postural Control Postural Control:  Within Functional Limits  Balance Balance Balance Assessed: Yes Static Sitting Balance Static Sitting - Balance Support: Feet supported;No upper extremity supported Static Sitting - Level of Assistance: 6: Modified independent (Device/Increase time) Static Sitting - Comment/# of Minutes: 5 Dynamic Sitting Balance Dynamic Sitting - Balance Support: During functional activity Dynamic Sitting - Level of Assistance: 6: Modified independent (Device/Increase time) Dynamic Sitting - Balance Activities: Lateral lean/weight shifting;Forward lean/weight shifting Dynamic Standing Balance Dynamic Standing - Balance Support: Bilateral upper extremity supported Dynamic Standing - Level of Assistance: 4: Min assist Extremity Assessment  RUE Assessment RUE Assessment: Within Functional Limits Active Range of Motion (AROM) Comments: WNL LUE Assessment LUE Assessment: Within Functional Limits Active Range of Motion (AROM) Comments: WNL RLE Assessment RLE Assessment: Exceptions to [facility] General Strength Comments: Impaired, see below RLE Strength Right Hip Flexion: 3/5 Right Knee Flexion: 2+/5 Right Knee Extension: 3+/5 Right Ankle Dorsiflexion: 2/5 Right Ankle Plantar Flexion: 2+/5 LLE Assessment General Strength Comments: Impaired see below LLE Strength Left Knee Flexion: 3+/5 Left Knee Extension: 4-/5 Left Ankle  Dorsiflexion: 4-/5 Left Ankle Plantar Flexion: 4-/5  Callie Fielding, PT   [facility] 08/30/2020, 1:05 PM

## 2020-08-31 DIAGNOSIS — M5104 Intervertebral disc disorders with myelopathy, thoracic region: Secondary | ICD-10-CM | POA: Diagnosis not present

## 2020-08-31 MED ORDER — MELATONIN 3 MG PO TABS: 3.0000 mg | ORAL_TABLET | Freq: Every evening | ORAL | 0 refills | Status: DC | PRN

## 2020-08-31 MED ORDER — BACLOFEN 5 MG HALF TABLET
5.0000 mg | ORAL_TABLET | Freq: Four times a day (QID) | ORAL | Status: DC
  Administered 2020-08-31 – 2020-09-01 (×4): 5 mg via ORAL
  Filled 2020-08-31 (×4): qty 1

## 2020-08-31 NOTE — Progress Notes (Signed)
[facility] PHYSICAL MEDICINE & REHABILITATION PROGRESS NOTE  Subjective/Complaints:   Pt reports didn't sleep well- has been having muscle spasms, muscle jerking of legs at night, which is real bothersome.  Doesn't think he's on Baclofen.   Will start Baclofen 5 mg QID.    ROS:   Pt denies SOB, abd pain, CP, N/V/C/D, and vision changes   Objective: Vital Signs: Blood pressure 119/77, pulse 90, temperature 98.1 F (36.7 C), temperature source Oral, resp. rate 19, height 5\' 8"  (1.727 m), weight 103.6 kg, SpO2 93 %. No results found. No results for input(s): WBC, HGB, HCT, PLT in the last 72 hours. No results for input(s): NA, K, CL, CO2, GLUCOSE, BUN, CREATININE, CALCIUM in the last 72 hours.  Intake/Output Summary (Last 24 hours) at 08/31/2020 0827 Last data filed at 08/31/2020 0617 Gross per 24 hour  Intake 960 ml  Output 2525 ml  Net -1565 ml        Physical Exam: BP 119/77 (BP Location: Left Arm)   Pulse 90   Temp 98.1 F (36.7 C) (Oral)   Resp 19   Ht 5\' 8"  (1.727 m)   Wt 103.6 kg   SpO2 93%   BMI 34.73 kg/m       General: awake, alert, appropriate, asleep initially, but woke easily, NAD HENT: conjugate gaze; oropharynx moist CV: regular rate and rhythm; no JVD Pulmonary: CTA B/L; no W/R/R- good air movement GI: soft, NT, ND, (+)BS- protuberant Psychiatric: appropriate- more interactive Neurological: Ox3; MAS of 1 to 1+ in LEs- 2-3 beats clonus B/L Skin: intact Motor: Bilateral upper extremities: 5/5  Right lower extremity: Hip flexion, knee extension 2+/5, ankle dorsiflexion 4-/5, no change Left lower extremity: Hip flexion, knee extension 2/5, ankle dorsiflexion 4 -/5, no change  Sensation diminished sl to light touch bilateral lower extremities    Assessment/Plan: 1. Functional deficits which require 3+ hours per day of interdisciplinary therapy in a comprehensive inpatient rehab setting.  Physiatrist is providing close team supervision and 24  hour management of active medical problems listed below.  Physiatrist and rehab team continue to assess barriers to discharge/monitor patient progress toward functional and medical goals   Care Tool:  Bathing    Body parts bathed by patient: Right arm,Left arm,Chest,Abdomen,Front perineal area,Right upper leg,Left upper leg,Face,Right lower leg,Left lower leg   Body parts bathed by helper: Buttocks     Bathing assist Assist Level: Minimal Assistance - Patient > 75%     Upper Body Dressing/Undressing Upper body dressing   What is the patient wearing?: Pull over shirt    Upper body assist Assist Level: Set up assist    Lower Body Dressing/Undressing Lower body dressing      What is the patient wearing?: Incontinence brief,Pants     Lower body assist Assist for lower body dressing: Moderate Assistance - Patient 50 - 74%     Toileting Toileting    Toileting assist Assist for toileting: Maximal Assistance - Patient 25 - 49% (bedlevel) Assistive Device Comment: pericare after urinal   Transfers Chair/bed transfer  Transfers assist     Chair/bed transfer assist level: Minimal Assistance - Patient > 75%     Locomotion Ambulation   Ambulation assist   Ambulation activity did not occur: Safety/medical concerns  Assist level: Independent Assistive device: Hand held assist Max distance: 20   Walk 10 feet activity   Assist  Walk 10 feet activity did not occur: Safety/medical concerns  Assist level: 2 helpers Assistive device: [employer]  Walk 50 feet activity   Assist Walk 50 feet with 2 turns activity did not occur: Safety/medical concerns         Walk 150 feet activity   Assist Walk 150 feet activity did not occur: Safety/medical concerns         Walk 10 feet on uneven surface  activity   Assist Walk 10 feet on uneven surfaces activity did not occur: Safety/medical concerns         Wheelchair     Assist Will patient use  wheelchair at discharge?: Yes Type of Wheelchair: Manual    Wheelchair assist level: Independent Max wheelchair distance: 300    Wheelchair 50 feet with 2 turns activity    Assist        Assist Level: Independent   Wheelchair 150 feet activity     Assist      Assist Level: Independent    Medical Problem List and Plan: 1.  Bilateral lower extremity weakness due to incomplete paraplegia, thoracic myelopathy   with sensory deficits, decreased endurance, poor awareness/lacks insight as well as pain affecting ADLs and mobility secondary to thoracic myelopathy/paraplegia with paraparesis.  Continue CIR PT, OT   3/8- walked some short distances with RW mod A- doing better  3/9- working on car transfers today if doesn't rain on him.  2.  Antithrombotics: -DVT/anticoagulation:  Pharmaceutical: Lovenox    Lower extremity Dopplers ordered  Dopplers (-)- will continue Lovenox  3/1- will need Lovenox for 2-3 months total from surgery- will order Lovenox teaching.  3/3- double check that wife is taught Lovenox injections   3/7- will change Lovenox to Qday- don't see a reason was BID  3/8- pt happier with Qday dosing. Will need a total of 3 months at d/c.              -antiplatelet therapy: N/a 3. Pain Management: Continue oxycodone prn.              --neuropathy BLE and right chest wall-->continue gabapentin 600 mg tid for now.   2/7- will Add Duloxetine 30 mg QHS and decrease Prozac to 20 mg daily since needs help with nerve pain and already at 600 mg TID of gabapentin  2/16- will increase Oxycodone to 15 mg q4 hours prn   2/17- pain doing better- today- explained might have difficulty giving this much pain meds when leaves, due to  [employer] for first 7 days- will see what we can do.   3/3- reduced Oxy to 10-15 mg based on pain scores  3/7- hasn't c/o pain lately and taking 10 mg Oxy usually- con't regimen  3/8- will see if can wean Oxy to 5-10 mg today.   3/9- had  tolerated change to 5-10 mg- don't think will be able to wean down more before d/c- will look at stopping long acting at f/u.  4. H/o anxiety/Mood: LCSW to follow for evaluation and support.              Prozaac  2/7- decrease to 20 mg daily so has Duloxetine  2/11- no change in affect  2/18- since on prozac, cannot increase Duloxetine  2/21- mood better last few days- con't regimen             -antipsychotic agents: Continue Zyprexa 5. Neuropsych: This patient is capable of making decisions on his own behalf. 6. Skin/Wound Care: Routine pressure relief measures. Hypoallergenic sheets.   Prevalon boots for both heels ordered 7. Fluids/Electrolytes/Nutrition: Monitor I/Os.  8. Neurogenic bladder:   UA unremarkable, urine culture pending  2/7- will recheck U/A and Cx due to incontinence AND leukocytosis  2/8- F/U U/A is strongly (+) for UTI- started Keflex 500 mg TID- awaiting Cx- will recheck CBC in AM- wait on stopping Flomax.  2/9- U Cx (+) for Ecoli- pansensitive- WBC down to 14k- will con't Keflex 500 mg TID for a total of 7 days.    2/10- labs in AM- CBC-diff and BMP to make sure things normalizing.  2/14- increased Flomax to 0.8 mg qevening- since required cath x1-   3/1- no caths- voiding OK- con't Flomax 0.8 mg nightly  3/2- stop Keflex for UTI 9. HTN: Monitor BP   Norvasc  Mildly elevated on 2/6, monitor for trend  2/28-systolic elevated last 2 reads but has mostly been stable-continue Norvasc  3/10- BP controlled- 119/77- con't regimen             Monitor with increased mobility 10. Neurogenic bowel:    KUB reviewed, unremarkable             Miralax daily and dulcolax supp w/dig stim after supper.   2/9- refused all bowel program x5+ days- explained to pt why he needs it- should be able to avoid "2 days of stools", but the more he waits, the more chance he will have  A 1-2 day "blowout", which will be due to not emptying gut. He said he would try it.  2/10- refused bowel  program again. Incontinent- doesn't understand can't control bowels.  2/13- pt continues to refuse bowel program. Had a frank conversation as to why we're doing it. Pt seemed to listen/understand. We'll see how it goes tonight  2/17- discussed bowel program and how it's to help reduce accidents over 3-6 weeks, not instantly- explained to pt/wife- can also order dig stim stick and suppository inserter as well- explained they are on [place] if wife wants to get them. Con't bowel program- had bowel accident last night- no BM with program.  3/9- used suppository 2 nights ago- had 2 BMs yesterday- incontinent- doesn't appear to care doesn't have control.  11.  Hypoalbuminemia  Supplement initiated on 2/5 12.  Transaminitis  Resolved 13.  Leukocytosis  WBC 12.1 on 2/5, labs ordered for tomorrow  2/7- WBC 17.7!- will check U/A and Cx again AND CXR since WBC up so much- not on steroids. Afebrile and says he doesn't feel ill.   2/8- has UTI- denied Sx's of UTI, however has incontinence- Keflex 500 mg TID- will wait for Cx results.   2/9- Cx shows [place] Keflex  2/15- resolved 14. Anemia: Hgb reviewed and low at 11.9, but improved from last read 15. Muscle spasms/spasticity  3/10- starting to have spasticity- will start baclofen 5 mg QID for spasms.  16. Dispo Tent d/c 3/11  3/7- d/c Friday 3/11- is walking? Still needs Lovenox x total 3 months because not walking 150 ft at a time.       LOS: 34 days A FACE TO FACE EVALUATION WAS PERFORMED  Megan Lovorn 08/31/2020, 8:27 AM

## 2020-08-31 NOTE — Progress Notes (Signed)
Physical Therapy Session Note  Patient Details  Name: Randall Mccormick MRN: [id number] Date of Birth: 1955-06-29  Today's Date: 08/31/2020 PT Individual Time: 0800-0900 and 1405-1505 PT Individual Time Calculation (min): 60 min and 60 min  Short Term Goals: Week 1:  PT Short Term Goal 1 (Week 1): Pt will be max A for transfers with LRAD PT Short Term Goal 1 - Progress (Week 1): Progressing toward goal PT Short Term Goal 2 (Week 1): Pt will be able to maintain sitting balance with mod A for 5 min PT Short Term Goal 2 - Progress (Week 1): Met PT Short Term Goal 3 (Week 1): Pt will be able to propel wc 50 ft with supervision. PT Short Term Goal 3 - Progress (Week 1): Met Week 2:  PT Short Term Goal 1 (Week 2): Pt will perform least restrictive transfer with max A PT Short Term Goal 1 - Progress (Week 2): Met PT Short Term Goal 2 (Week 2): Pt will tolerate standing x 5 min with LRAD PT Short Term Goal 2 - Progress (Week 2): Progressing toward goal PT Short Term Goal 3 (Week 2): Pt will propel w/c x 150 ft with min A PT Short Term Goal 3 - Progress (Week 2): Met PT Short Term Goal 4 (Week 2): Pt will maintain dynamic sitting balance x 5 min with min A PT Short Term Goal 4 - Progress (Week 2): Met Week 3:  PT Short Term Goal 1 (Week 3): Pt will perform least restrictive transfer with mod A consistently. PT Short Term Goal 1 - Progress (Week 3): Met PT Short Term Goal 2 (Week 3): Pt will tolerate standing x 5 min with LRAD. PT Short Term Goal 2 - Progress (Week 3): Not met PT Short Term Goal 3 (Week 3): Pt will propel wc x 300 ft with supervision. PT Short Term Goal 3 - Progress (Week 3): Met  Skilled Therapeutic Interventions/Progress Updates:   AM SESSION  Pain:  Pt reports no pain.  Treatment to tolerance.  Rest breaks and repositioning as needed.  Pt initially initially supine and agreeable to treatment session w/focus on am ADLs and gait. Pt requesting assist to BR.   Performed bilat  LE AROM hip abd/add, heel slides, ankle DF/PF, SAQs. Supine to side to sit w/hob elevated and bed rails w/supervision. Sit to stand in stedy w/cga and transported to commode, stand to sit w/min assist. Pt did not have BM, perfoms washing of anterior perineal area w/set up.  Sit to stand w/cga w/cga, therapist cleans pt post perineum.  Transported to wc via [place]. .stand to sit in wc w/cga. Pt lifts feet for therapist to thread clean brief and shorts.  Sit to stand in stedy w/cga and therapist raises pants/brief.  Stand to sit w/cga. RT assisted pt by donning AFOs and shoes.  Pt transported to hallway for gait/training w/Sit to stand  Gait 21f x 2 w/RW and min assist, cues for posture, cues for safe step length, increased step length results in increased hip and knee instability but pt aware and requests wc, RT following w/wc.  Reviewed mechanics and set up for successful Sit to stand Pt practiced mult efforts w/min assist at most, cga at least.  Demonstrated mechanics w/ant wt shift and importance of foot placement.  At end of session, Pt left oob in wc w/alarm belt set and needs in reach    PM SESSION Pain:  Pt reports no pain.  Treatment to tolerance.  Rest breaks  and repositioning as needed.  Pt initially supine and agreeable to treatment session.  Pt supine to sit on edge of bed mod I w/rails and hob elevated.  Pt Sit to stand in stedy w/supervision, transfers to wc via stedy per his request.  bilat shoes and AFOs donned by therapist.   Pt Propels wc mod I to gym >13f, Balance - in parallel bars worked on standing w/bilat UE support and stepping to targets, 1/4 clock pattern w/each foot, 4x each LE, repeated after seated rest w/cga, carful guarding at knees, freqeuent reminders to check posture. Standing w/single UE support > 30 sec repeated x 2 each UE. Pt c/o urgent need to urinate.  Propels 581fmod I, transported remainder of distance due to urinary urgency. Sit to stand in stedy  w/cga.  Pt continent of urine, mod assist w/clothing management. Stand to sit in wc w/cga. Pt transported back to gym. Gait x 2169f/RW, min assist, wc follow for safety, cues to widen base of support, cues for upright posture due to gradual increase in trunk and hip flexion w/fatigue. Pt propels wc >150f81fd I.  wc to [facility].  Sit to supine w/supervision and bed features.  Pt able to scoot in supine using headboard.   Pt left supine w/rails up x 4, alarm set, bed in lowest position, and needs in reach.    Therapy Documentation Precautions:  Precautions Precautions: Back,Fall,Other (comment) Precaution Booklet Issued: No Precaution Comments: paraparesis Required Braces or Orthoses: Spinal Brace Spinal Brace: Other (comment) Spinal Brace Comments: Figure 8 Brace Restrictions Weight Bearing Restrictions: No  Therapy/Group: Individual Therapy  BarbCallie Fielding  [place]0/2022, 9:10 AM

## 2020-08-31 NOTE — Progress Notes (Signed)
Occupational Therapy Session Note  Patient Details  Name: Pantelis Elgersma MRN: [id number] Date of Birth: [birth year]  Today's Date: 08/31/2020 OT Individual Time: 0900-1000 OT Individual Time Calculation (min): 60 min    Short Term Goals: Week 5:  OT Short Term Goal 1 (Week 5): STGs=LTGs due to ELOS  Skilled Therapeutic Interventions/Progress Updates:    Pt resting in w/c upon arrival. OT intervention with focus on sit<>stand, standing balance, functional transfers, bathing at shower level, and dressing with sit<>stand from w/c with RW. See Care Tool for assist levels. Sit<>stand with CGA and standing balance with CGA/min A. Pt requires assistance with LB dressing tasks without use of AE. Pt continues to periodically decline use of AE, preferring assistance from care giver. Pt remained in w/c with all needs within reach and seat belt secured.  Therapy Documentation Precautions:  Precautions Precautions: Back,Fall,Other (comment) Precaution Booklet Issued: No Precaution Comments: paraparesis Required Braces or Orthoses: Spinal Brace Spinal Brace: Other (comment) Spinal Brace Comments: Figure 8 Brace Restrictions Weight Bearing Restrictions: No Pain:  Pt denies pain this morning   Therapy/Group: Individual Therapy  Leroy Libman 08/31/2020, 10:00 AM

## 2020-08-31 NOTE — Progress Notes (Incomplete)
Occupational Therapy Discharge Summary  Patient Details  Name: Randall Mccormick MRN: [id number] Date of Birth: 08-Sep-1955   Patient has met 7 of 7 long term goals due to {due to:3041651}.  Pt progress was slow during this admission but pt was able to achieve all bathing/dressing and toileting goals. Pt requires min A/CGA for sit/stand, standing balance, and functional transfers with RW. Pt requires mod A for LB dressing, bathing, and toileting tasks. Pt's wife has been present for therapy and actively participated in providing appropriate assistance. Patient to discharge at overall {LOA:3049010} level.  Patient's care partner {care partner:3041650} to provide the necessary {assistance:3041652} assistance at discharge.    Recommendation:  Patient will benefit from ongoing skilled OT services in {setting:3041680} to continue to advance functional skills in the area of {ADL/iADL:3041649}.  Equipment: No equipment provided  Reasons for discharge: {Reason for discharge:3049018}  Patient/family agrees with progress made and goals achieved: {Pt/Family agree with progress/goals:3049020}  OT Discharge Vision Baseline Vision/History: Wears glasses Wears Glasses: Reading only Patient Visual Report: No change from baseline Vision Assessment?: No apparent visual deficits Perception  Perception: Within Functional Limits Praxis Praxis: Intact Cognition Overall Cognitive Status: Within Functional Limits for tasks assessed Arousal/Alertness: Awake/alert Orientation Level: Oriented X4 Attention: Sustained Focused Attention: Appears intact Sustained Attention: Appears intact Memory: Appears intact Awareness: Appears intact Problem Solving: Appears intact Safety/Judgment: Appears intact Sensation Sensation Light Touch: Impaired Detail Light Touch Impaired Details: Impaired RLE;Impaired LLE Proprioception: Impaired Detail Proprioception Impaired Details: Impaired RLE;Absent  LLE Coordination Gross Motor Movements are Fluid and Coordinated: Yes Fine Motor Movements are Fluid and Coordinated: Yes Finger Nose Finger Test: WNL bilaterally Motor  Motor Motor: Paraplegia;Abnormal postural alignment and control;Abnormal tone Motor - Skilled Clinical Observations: paraparesis    Trunk/Postural Assessment  Cervical Assessment Cervical Assessment: Within Functional Limits Thoracic Assessment Thoracic Assessment:  (rounded shoulders) Lumbar Assessment Lumbar Assessment:  (back precautions) Postural Control Postural Control: Within Functional Limits  Balance Static Sitting Balance Static Sitting - Balance Support: Feet supported Static Sitting - Level of Assistance: 6: Modified independent (Device/Increase time) Dynamic Sitting Balance Dynamic Sitting - Balance Support: During functional activity Dynamic Sitting - Level of Assistance: 6: Modified independent (Device/Increase time) Extremity/Trunk Assessment RUE Assessment RUE Assessment: Within Functional Limits Active Range of Motion (AROM) Comments: WNL LUE Assessment LUE Assessment: Within Functional Limits Active Range of Motion (AROM) Comments: WNL   Leroy Libman 08/31/2020, 6:27 AM

## 2020-08-31 NOTE — Discharge Summary (Signed)
Physician Discharge Summary  Patient ID: Randall Mccormick MRN: [id number] DOB/AGE: 10-22-1955 65 y.o.  Admit date: 07/28/2020 Discharge date: 09/01/2020  Discharge Diagnoses:  Principal Problem:   Thoracic disc disease with myelopathy Active Problems:   Constipation   Neurogenic bowel   Neurogenic bladder   Neuropathic pain   Chronic pain syndrome   Hypoalbuminemia due to protein-calorie malnutrition (HCC)   Discharged Condition:  Stable   Significant Diagnostic Studies: No results found.  Labs:  Basic Metabolic Panel: BMP Latest Ref Rng & Units 08/28/2020 08/21/2020 08/14/2020  Glucose 70 - 99 mg/dL 94 98 101(H)  BUN 8 - 23 mg/dL 13 12 12   Creatinine 0.61 - 1.24 mg/dL 0.93 0.84 0.86  Sodium 135 - 145 mmol/L 138 140 138  Potassium 3.5 - 5.1 mmol/L 3.8 4.1 3.9  Chloride 98 - 111 mmol/L 102 103 101  CO2 22 - 32 mmol/L 27 30 31   Calcium 8.9 - 10.3 mg/dL 9.1 9.1 8.9    CBC: CBC Latest Ref Rng & Units 08/28/2020 08/21/2020 08/14/2020  WBC 4.0 - 10.5 K/uL 7.2 7.2 7.8  Hemoglobin 13.0 - 17.0 g/dL 12.1(L) 11.9(L) 11.5(L)  Hematocrit 39.0 - 52.0 % 39.7 38.8(L) 37.4(L)  Platelets 150 - 400 K/uL 399 297 285    CBG: No results for input(s): GLUCAP in the last 168 hours.  Brief HPI:   Randall Mccormick is a 65 y.o. male with history of HTN, Vitamin D deficienty, gout arthritis, anxiety d/o.  DJD with multiple back surgeries most recent 50/02/3817 complicated by falls and weakness with decline in function starting October of last year.  He was found to have severe flattening of thecal sac with severe segmental spinal stenosis from T8-T10 and epidural thickening with fibrosis, disc bulge.  He was admitted to [facility] on 07/19/2020 for posterior thoracic fusion with laminectomy and extension of arthrodesis to T8.  Postop course significant for BLE paraplegia with hypotension treated with riluzole, plasma as well as pressures.  He is also had issues with neurogenic bowel and bladder symptoms.  He continued  to have bilateral leg weakness with sensory deficits, decreased endurance, poor awareness of deficits as well as pain affecting ADLs and mobility.  CIR was recommended due to functional decline.     Hospital Course: Robinson Brinkley was admitted to rehab 07/28/2020 for inpatient therapies to consist of PT and OT at least three hours five days a week. Past admission physiatrist, therapy team and rehab RN have worked together to provide customized collaborative inpatient rehab.  BLE Dopplers done for surveillance and were negative for DVT.  He was maintained on Lovenox for DVT prophylaxis during his stay and patient/wife have been educated on need to continue [facility] for 3 total months.   Protein supplements were added to help with hypoalbuminemia.  Abnormal LFTs have been monitored and are resolving.  Follow-up CBC shows acute blood loss anemia to be slowly improving.  Serial check of basic metabolic panel showed electrolytes and renal status to be within normal limits.  His back incision is C/D/I and is healing well without any signs or symptoms of infection.  Blood pressures were monitored on 3 times daily basis and have been stable.  He was started on bladder programs with bladder scan to monitor postvoid residuals.  He was E. coli UTI and was treated with 7-day course of Keflex for this.  Flomax was increased to 0.8 mg with improvement in voiding function.  KUB done was unremarkable and he was started on bowel program however  has refused this during his stay.  He was also started on bowel program but has refused to participate in this.  He has had some accidents and ha has been educated extensively regarding need for bowel regimen.  Team has provided ego support during his stay.  Cymbalta was added to help with pain as well as mood stabilization.  Oxycodone has also been used on as needed basis for pain management.  Baclofen was added to help with spasticity.  His participation has slowly improved with ego support and  encouragement by team.  He currently requires min assist wheelchair level and will continue to receive follow-up home health PT, OT and Aide  by [facility] after discharge.    Rehab course: During patient's stay in rehab weekly team conferences were held to monitor patient's progress, set goals and discuss barriers to discharge. At admission, patient required total assist with mobility and basic self-care tasks.  He  has had improvement in activity tolerance, balance, postural control as well as ability to compensate for deficits.   He is able to complete upper body tasks at modified independent level and requires mod assist for lower body care and toileting tasks.  He requires min assist with verbal and tactile cues for transfers and to ambulate 35 feet with rolling walker.  He is able to propel his wheelchair for 500 feet at modified independent level.  Family education was completed with wife.  Disposition:  home   Diet: Regular.   Special Instructions: 1. Maintain back precautions. 2. Need to perform dig stim with suppository if no BM in 48 hours. 3. Lovenox to continue for 2 additional months for DVT prophylaxis.    Discharge Instructions    Ambulatory referral to Physical Medicine Rehab   Complete by: As directed    1-2 weeks follow up visit     Allergies as of 09/01/2020   No Known Allergies     Medication List    STOP taking these medications   gabapentin 300 MG capsule Commonly known as: NEURONTIN Replaced by: gabapentin 600 MG tablet   lisinopril 40 MG tablet Commonly known as: ZESTRIL   magnesium hydroxide 400 MG/5ML suspension Commonly known as: MILK OF MAGNESIA   meloxicam 7.5 MG tablet Commonly known as: MOBIC   methocarbamol 500 MG tablet Commonly known as: ROBAXIN   riluzole 50 MG tablet Commonly known as: RILUTEK   senna-docusate 8.6-50 MG tablet Commonly known as: Senokot-S     TAKE these medications   allopurinol 100 MG tablet Commonly  known as: ZYLOPRIM Take 1 tablet (100 mg total) by mouth daily. What changed:   medication strength  how much to take   amLODipine 5 MG tablet Commonly known as: NORVASC Take by mouth.   Baclofen 5 MG Tabs Take 5 mg by mouth 4 (four) times daily as needed.   bisacodyl 10 MG suppository Commonly known as: DULCOLAX Place 1 suppository (10 mg total) rectally daily after supper.   DULoxetine 30 MG capsule Commonly known as: CYMBALTA Take 1 capsule (30 mg total) by mouth at bedtime.   enoxaparin 40 MG/0.4ML injection Commonly known as: LOVENOX Inject 0.4 mLs (40 mg total) into the skin daily. Notes to patient: For prevent blood clots in legs or lungs   ezetimibe 10 MG tablet Commonly known as: ZETIA Take 10 mg by mouth at bedtime.   fexofenadine 60 MG tablet Commonly known as: ALLEGRA Take 1 tablet (60 mg total) by mouth 2 (two) times daily.  FLUoxetine 20 MG capsule Commonly known as: PROZAC Take 1 capsule (20 mg total) by mouth daily. What changed: how much to take   gabapentin 600 MG tablet Commonly known as: NEURONTIN Take 1 tablet (600 mg total) by mouth 3 (three) times daily. Replaces: gabapentin 300 MG capsule   melatonin 3 MG Tabs tablet Take 1 tablet (3 mg total) by mouth at bedtime as needed.   morphine 15 MG 12 hr tablet--Rx #14 pills Commonly known as: MS CONTIN Take 1 tablet (15 mg total) by mouth every 12 (twelve) hours. Notes to patient: Starting SUNDAY--decrease to [place].    OLANZapine 20 MG tablet Commonly known as: ZYPREXA Take 20 mg by mouth daily.   Oxycodone HCl 10 MG Tabs--Rx#  25 pills  Take 0.5-1 tablets (5-10 mg total) by mouth every 6 (six) hours as needed for severe pain. What changed:   how much to take  when to take this  reasons to take this Notes to patient: Last used on 08/10--> limit to 2 pills daily after MS contin is discontinued   polyethylene glycol powder 17 GM/SCOOP powder Commonly known as:  GLYCOLAX/MIRALAX Take 17 g by mouth daily as needed (constipation). Mix in 4-8 oz fluid prior to taking   tamsulosin 0.4 MG Caps capsule Commonly known as: FLOMAX Take 2 capsules (0.8 mg total) by mouth See admin instructions. What changed:   how much to take  additional instructions   traZODone 50 MG tablet Commonly known as: DESYREL Take 50 mg by mouth at bedtime.     ASK your doctor about these medications   acetaminophen 325 MG tablet Commonly known as: TYLENOL Take by mouth. Ask about: Should I take this medication?       Follow-up Information    Lovorn, Jinny Blossom, MD Follow up.   Specialty: Physical Medicine and Rehabilitation Why: Office will call you with follow up appointment Contact information: [address] [place] [address] [phone]        Gloris Manchester, MD. Call on 09/04/2020.   Specialty: Neurosurgery Why: for post op check.  Contact information: [place] [address] [phone]        [name], MD. Call.   Specialty: Internal Medicine Why: for post hospital follow up Contact information: [place] [address] [phone]               Signed: Bary Leriche 09/01/2020, 9:12 AM

## 2020-09-01 DIAGNOSIS — M5104 Intervertebral disc disorders with myelopathy, thoracic region: Secondary | ICD-10-CM | POA: Diagnosis not present

## 2020-09-01 MED ORDER — GABAPENTIN 600 MG PO TABS: 600.0000 mg | ORAL_TABLET | Freq: Three times a day (TID) | ORAL | 0 refills | Status: DC

## 2020-09-01 MED ORDER — BACLOFEN 5 MG PO TABS: 5.0000 mg | ORAL_TABLET | Freq: Four times a day (QID) | ORAL | 0 refills | Status: DC | PRN

## 2020-09-01 MED ORDER — OXYCODONE HCL 10 MG PO TABS
5.0000 mg | ORAL_TABLET | Freq: Four times a day (QID) | ORAL | 0 refills | Status: DC | PRN
Start: 2020-09-01 — End: 2022-04-02

## 2020-09-01 MED ORDER — DULOXETINE HCL 30 MG PO CPEP: 30.0000 mg | ORAL_CAPSULE | Freq: Every day | ORAL | 0 refills | Status: DC

## 2020-09-01 MED ORDER — FLUOXETINE HCL 20 MG PO CAPS: 20.0000 mg | ORAL_CAPSULE | Freq: Every day | ORAL | 0 refills | Status: DC

## 2020-09-01 MED ORDER — ENOXAPARIN SODIUM 40 MG/0.4ML ~~LOC~~ SOLN: 40.0000 mg | SUBCUTANEOUS | 1 refills | Status: DC

## 2020-09-01 MED ORDER — BISACODYL 10 MG RE SUPP: 10.0000 mg | Freq: Every day | RECTAL | 0 refills | Status: DC

## 2020-09-01 MED ORDER — TAMSULOSIN HCL 0.4 MG PO CAPS: 0.8000 mg | ORAL_CAPSULE | ORAL | 0 refills | Status: DC

## 2020-09-01 MED ORDER — ALLOPURINOL 100 MG PO TABS: 100.0000 mg | ORAL_TABLET | Freq: Every day | ORAL | 0 refills | Status: DC

## 2020-09-01 MED ORDER — MORPHINE SULFATE ER 15 MG PO TBCR: 15.0000 mg | EXTENDED_RELEASE_TABLET | Freq: Two times a day (BID) | ORAL | 0 refills | Status: DC

## 2020-09-01 MED ORDER — FEXOFENADINE HCL 60 MG PO TABS: 60.0000 mg | ORAL_TABLET | Freq: Two times a day (BID) | ORAL | 0 refills | Status: DC

## 2020-09-01 NOTE — Discharge Instructions (Signed)
Inpatient Rehab Discharge Instructions  Randall Mccormick Discharge date and time:  09/01/20  Activities/Precautions/ Functional Status: Activity: no lifting, driving, or strenuous exercise till cleared by MD Diet: regular diet Wound Care: keep wound clean and dry    Functional status:  ___ No restrictions     ___ Walk up steps independently _X__ 24/7 supervision/assistance   ___ Walk up steps with assistance ___ Intermittent supervision/assistance  ___ Bathe/dress independently ___ Walk with walker     _X__ Bathe/dress with assistance ___ Walk Independently    ___ Shower independently ___ Walk with assistance    ___ Shower with assistance _X__ No alcohol     ___ Return to work/school ________   COMMUNITY REFERRALS UPON DISCHARGE:    Home Health:   PT     OT     SNA                 Agency: [facility]    Phone: [phone] *Please expect follow-up within 2-3 days of discharge. If you have not received follow-up, be sure to contact the branch directly.     Medical Equipment/Items Ordered: loaner w/c                                                 Agency/Supplier: [facility] [phone]   Special Instructions: 1. Need to make sure that you have a BM daily. Use suppository to help empty bowels and to prevent accidents. 2. Maintain back precautions.    My questions have been answered and I understand these instructions. I will adhere to these goals and the provided educational materials after my discharge from the hospital.  Patient/Caregiver Signature _______________________________ Date __________  Clinician Signature _______________________________________ Date __________  Please bring this form and your medication list with you to all your follow-up doctor's appointments.

## 2020-09-01 NOTE — Progress Notes (Signed)
Recreational Therapy Discharge Summary Patient Details  Name: Omarrion Carmer MRN: [id number] Date of Birth: October 19, 1955 Today's Date: 09/01/2020  Long term goals set: 2  Long term goals met: 2  Comments on progress toward goals: Pt has made good progress during LOS and is ready for discharge home today with his wife to provide/coordinate 24 hour assistance.  TR sessions focused on leisure education, activity analysis with identification of potential modifications, accessible travel, community reintegration and problem solving through overseas traveling.  Goals met.  Reasons for discharge: discharge from hospital  Patient/family agrees with progress made and goals achieved: Yes  Ebunoluwa Gernert 09/01/2020, 8:25 AM

## 2020-09-01 NOTE — Progress Notes (Signed)
Patient discharged with wife. Stated they understood all discharge orders. Sanda Linger, LPN

## 2020-09-01 NOTE — Progress Notes (Signed)
[facility] PHYSICAL MEDICINE & REHABILITATION PROGRESS NOTE  Subjective/Complaints:   Pt reports muscle spasms MUCH better last night, but wasn't sure was from baclofen. Slept much better.  Ready for d/c.  Was asking if HAS to take Baclofen- explained I would take it as needed, however needs an Rx for it, because spasticity gets worse over time- explained to pt.    ROS:   Pt denies SOB, abd pain, CP, N/V/C/D, and vision changes  Objective: Vital Signs: Blood pressure 120/81, pulse 77, temperature 98.5 F (36.9 C), resp. rate 18, height 5\' 8"  (1.727 m), weight 103.6 kg, SpO2 94 %. No results found. No results for input(s): WBC, HGB, HCT, PLT in the last 72 hours. No results for input(s): NA, K, CL, CO2, GLUCOSE, BUN, CREATININE, CALCIUM in the last 72 hours.  Intake/Output Summary (Last 24 hours) at 09/01/2020 0835 Last data filed at 09/01/2020 0700 Gross per 24 hour  Intake 660 ml  Output 800 ml  Net -140 ml        Physical Exam: BP 120/81 (BP Location: Left Arm)   Pulse 77   Temp 98.5 F (36.9 C)   Resp 18   Ht 5\' 8"  (1.727 m)   Wt 103.6 kg   SpO2 94%   BMI 34.73 kg/m       General: awake, alert, appropriate,  Finished breakfast, NAD HENT: conjugate gaze; oropharynx moist CV: regular rate; no JVD Pulmonary: CTA B/L; no W/R/R- good air movement GI: soft, NT, ND, (+)BS Psychiatric: appropriate Neurological: Ox3- MAS of 1 to 1+ in LEs- no spasms today  Skin: intact Motor: Bilateral upper extremities: 5/5  Right lower extremity: Hip flexion, knee extension 2+/5, ankle dorsiflexion 4-/5, no change Left lower extremity: Hip flexion, knee extension 2/5, ankle dorsiflexion 4 -/5, no change  Sensation diminished sl to light touch bilateral lower extremities    Assessment/Plan: 1. Functional deficits which require 3+ hours per day of interdisciplinary therapy in a comprehensive inpatient rehab setting.  Physiatrist is providing close team supervision and 24 hour  management of active medical problems listed below.  Physiatrist and rehab team continue to assess barriers to discharge/monitor patient progress toward functional and medical goals   Care Tool:  Bathing    Body parts bathed by patient: Right arm,Left arm,Chest,Abdomen,Front perineal area,Right upper leg,Left upper leg,Face,Right lower leg,Left lower leg   Body parts bathed by helper: Buttocks     Bathing assist Assist Level: Minimal Assistance - Patient > 75%     Upper Body Dressing/Undressing Upper body dressing   What is the patient wearing?: Pull over shirt    Upper body assist Assist Level: Independent    Lower Body Dressing/Undressing Lower body dressing      What is the patient wearing?: Incontinence brief,Pants     Lower body assist Assist for lower body dressing: Moderate Assistance - Patient 50 - 74%     Toileting Toileting    Toileting assist Assist for toileting: Moderate Assistance - Patient 50 - 74% Assistive Device Comment: pericare after urinal   Transfers Chair/bed transfer  Transfers assist     Chair/bed transfer assist level: Minimal Assistance - Patient > 75%     Locomotion Ambulation   Ambulation assist   Ambulation activity did not occur: Safety/medical concerns  Assist level: Independent Assistive device: Hand held assist Max distance: 20   Walk 10 feet activity   Assist  Walk 10 feet activity did not occur: Safety/medical concerns  Assist level: 2 helpers Assistive  device: Walker-rolling   Walk 50 feet activity   Assist Walk 50 feet with 2 turns activity did not occur: Safety/medical concerns         Walk 150 feet activity   Assist Walk 150 feet activity did not occur: Safety/medical concerns         Walk 10 feet on uneven surface  activity   Assist Walk 10 feet on uneven surfaces activity did not occur: Safety/medical concerns         Wheelchair     Assist Will patient use wheelchair at  discharge?: Yes Type of Wheelchair: Manual    Wheelchair assist level: Independent Max wheelchair distance: 300    Wheelchair 50 feet with 2 turns activity    Assist        Assist Level: Independent   Wheelchair 150 feet activity     Assist      Assist Level: Independent    Medical Problem List and Plan: 1.  Bilateral lower extremity weakness due to incomplete paraplegia, thoracic myelopathy   with sensory deficits, decreased endurance, poor awareness/lacks insight as well as pain affecting ADLs and mobility secondary to thoracic myelopathy/paraplegia with paraparesis.  Continue CIR PT, OT   3/8- walked some short distances with RW mod A- doing better  3/9- working on car transfers today if doesn't rain on him.  2.  Antithrombotics: -DVT/anticoagulation:  Pharmaceutical: Lovenox    Lower extremity Dopplers ordered  Dopplers (-)- will continue Lovenox  3/1- will need Lovenox for 2-3 months total from surgery- will order Lovenox teaching.  3/3- double check that wife is taught Lovenox injections   3/7- will change Lovenox to Qday- don't see a reason was BID  3/8- pt happier with Qday dosing. Will need a total of 3 months at d/c.              -antiplatelet therapy: N/a 3. Pain Management: Continue oxycodone prn.              --neuropathy BLE and right chest wall-->continue gabapentin 600 mg tid for now.   2/7- will Add Duloxetine 30 mg QHS and decrease Prozac to 20 mg daily since needs help with nerve pain and already at 600 mg TID of gabapentin  2/16- will increase Oxycodone to 15 mg q4 hours prn   2/17- pain doing better- today- explained might have difficulty giving this much pain meds when leaves, due to  [employer] for first 7 days- will see what we can do.   3/3- reduced Oxy to 10-15 mg based on pain scores  3/7- hasn't c/o pain lately and taking 10 mg Oxy usually- con't regimen  3/8- will see if can wean Oxy to 5-10 mg today.   3/9- had tolerated change to  5-10 mg- don't think will be able to wean down more before d/c- will look at stopping long acting at f/u.   3/11- will get 7 days of pain meds when discharged, but can get refills from PCP, since they were prescribing prior to hospitalization 4. H/o anxiety/Mood: LCSW to follow for evaluation and support.              Prozaac  2/7- decrease to 20 mg daily so has Duloxetine  2/11- no change in affect  2/18- since on prozac, cannot increase Duloxetine  2/21- mood better last few days- con't regimen             -antipsychotic agents: Continue Zyprexa 5. Neuropsych: This patient is capable  of making decisions on his own behalf. 6. Skin/Wound Care: Routine pressure relief measures. Hypoallergenic sheets.   Prevalon boots for both heels ordered 7. Fluids/Electrolytes/Nutrition: Monitor I/Os.    8. Neurogenic bladder:   UA unremarkable, urine culture pending  2/7- will recheck U/A and Cx due to incontinence AND leukocytosis  2/8- F/U U/A is strongly (+) for UTI- started Keflex 500 mg TID- awaiting Cx- will recheck CBC in AM- wait on stopping Flomax.  2/9- U Cx (+) for Ecoli- pansensitive- WBC down to 14k- will con't Keflex 500 mg TID for a total of 7 days.    2/10- labs in AM- CBC-diff and BMP to make sure things normalizing.  2/14- increased Flomax to 0.8 mg qevening- since required cath x1-   3/1- no caths- voiding OK- con't Flomax 0.8 mg nightly  3/2- stop Keflex for UTI 9. HTN: Monitor BP   Norvasc  Mildly elevated on 2/6, monitor for trend  2/28-systolic elevated last 2 reads but has mostly been stable-continue Norvasc  3/10- BP controlled- 119/77- con't regimen             Monitor with increased mobility 10. Neurogenic bowel:    KUB reviewed, unremarkable             Miralax daily and dulcolax supp w/dig stim after supper.   2/9- refused all bowel program x5+ days- explained to pt why he needs it- should be able to avoid "2 days of stools", but the more he waits, the more chance he will  have  A 1-2 day "blowout", which will be due to not emptying gut. He said he would try it.  2/10- refused bowel program again. Incontinent- doesn't understand can't control bowels.  2/13- pt continues to refuse bowel program. Had a frank conversation as to why we're doing it. Pt seemed to listen/understand. We'll see how it goes tonight  2/17- discussed bowel program and how it's to help reduce accidents over 3-6 weeks, not instantly- explained to pt/wife- can also order dig stim stick and suppository inserter as well- explained they are on [place] if wife wants to get them. Con't bowel program- had bowel accident last night- no BM with program.  3/9- used suppository 2 nights ago- had 2 BMs yesterday- incontinent- doesn't appear to care doesn't have control.  11.  Hypoalbuminemia  Supplement initiated on 2/5 12.  Transaminitis  Resolved 13.  Leukocytosis  WBC 12.1 on 2/5, labs ordered for tomorrow  2/7- WBC 17.7!- will check U/A and Cx again AND CXR since WBC up so much- not on steroids. Afebrile and says he doesn't feel ill.   2/8- has UTI- denied Sx's of UTI, however has incontinence- Keflex 500 mg TID- will wait for Cx results.   2/9- Cx shows Laura Keflex  2/15- resolved 14. Anemia: Hgb reviewed and low at 11.9, but improved from last read 15. Muscle spasms/spasticity  3/10- starting to have spasticity- will start baclofen 5 mg QID for spasms.  16. Dispo Tent d/c 3/11  3/7- d/c Friday 3/11- is walking? Still needs Lovenox x total 3 months because not walking 150 ft at a time.  3/11- d/c today- will need f/u in 4 weeks or so in clinic with me- and PCP to refill pain meds after the 7 days, since was prescribing prior to hospital admission.       LOS: 35 days A FACE TO FACE EVALUATION WAS PERFORMED  Nathanael Krist 09/01/2020, 8:35 AM

## 2020-09-01 NOTE — Plan of Care (Signed)
  Problem: Consults Goal: RH SPINAL CORD INJURY PATIENT EDUCATION Description:  See Patient Education module for education specifics.  09/01/2020 1314 by Renda Rolls L, LPN Outcome: Completed/Met 09/01/2020 1314 by Renda Rolls L, LPN Outcome: Progressing   Problem: SCI BOWEL ELIMINATION Goal: RH STG MANAGE BOWEL WITH ASSISTANCE Description: STG Manage Bowel with min Assistance. 09/01/2020 1314 by Renda Rolls L, LPN Outcome: Completed/Met 09/01/2020 1314 by Renda Rolls L, LPN Outcome: Progressing Goal: RH STG SCI MANAGE BOWEL PROGRAM W/ASSIST OR AS APPROPRIATE Description: STG SCI Manage bowel program with min assist or as appropriate. 09/01/2020 1314 by Renda Rolls L, LPN Outcome: Completed/Met 09/01/2020 1314 by Renda Rolls L, LPN Outcome: Progressing   Problem: SCI BLADDER ELIMINATION Goal: RH STG SCI MANAGE BLADDER PROGRAM W/ASSISTANCE Description: Manage bladder program with min assist 09/01/2020 1314 by Raidyn Breiner L, LPN Outcome: Completed/Met 09/01/2020 1314 by Masin Shatto L, LPN Outcome: Progressing   Problem: RH PAIN MANAGEMENT Goal: RH STG PAIN MANAGED AT OR BELOW PT'S PAIN GOAL Description: Pain level less than 4 on scale of 0-10 09/01/2020 1314 by Samuele Storey L, LPN Outcome: Completed/Met 09/01/2020 1314 by Hasna Stefanik L, LPN Outcome: Progressing   Problem: RH KNOWLEDGE DEFICIT SCI Goal: RH STG INCREASE KNOWLEDGE OF SELF CARE AFTER SCI Description: Pt will be able to demonstrate management of bowel and bladder programs with min assist.  09/01/2020 1314 by Renda Rolls L, LPN Outcome: Completed/Met 09/01/2020 1314 by Nazarene Bunning L, LPN Outcome: Progressing   Problem: RH SKIN INTEGRITY Goal: RH STG MAINTAIN SKIN INTEGRITY WITH ASSISTANCE Description: STG Maintain Skin Integrity With min Assistance. 09/01/2020 1314 by Renda Rolls L, LPN Outcome: Completed/Met 09/01/2020 1314 by Renda Rolls L, LPN Outcome: Progressing

## 2020-09-01 NOTE — Plan of Care (Signed)
  Problem: Consults Goal: RH SPINAL CORD INJURY PATIENT EDUCATION Description:  See Patient Education module for education specifics.  Outcome: Progressing   Problem: SCI BOWEL ELIMINATION Goal: RH STG MANAGE BOWEL WITH ASSISTANCE Description: STG Manage Bowel with min Assistance. Outcome: Progressing Goal: RH STG SCI MANAGE BOWEL PROGRAM W/ASSIST OR AS APPROPRIATE Description: STG SCI Manage bowel program with min assist or as appropriate. Outcome: Progressing   Problem: SCI BLADDER ELIMINATION Goal: RH STG SCI MANAGE BLADDER PROGRAM W/ASSISTANCE Description: Manage bladder program with min assist Outcome: Progressing   Problem: RH PAIN MANAGEMENT Goal: RH STG PAIN MANAGED AT OR BELOW PT'S PAIN GOAL Description: Pain level less than 4 on scale of 0-10 Outcome: Progressing   Problem: RH KNOWLEDGE DEFICIT SCI Goal: RH STG INCREASE KNOWLEDGE OF SELF CARE AFTER SCI Description: Pt will be able to demonstrate management of bowel and bladder programs with min assist.  Outcome: Progressing   Problem: RH SKIN INTEGRITY Goal: RH STG MAINTAIN SKIN INTEGRITY WITH ASSISTANCE Description: STG Maintain Skin Integrity With min Assistance. Outcome: Progressing

## 2020-09-05 ENCOUNTER — Telehealth: Payer: Self-pay

## 2020-09-05 NOTE — Telephone Encounter (Signed)
Transitional Care call--Wife [name]    1. Are you/is patient experiencing any problems since coming home? No  Are there any questions regarding any aspect of care? No 2. Are there any questions regarding medications administration/dosing? Yes regarding Lisinopril and Prozac. Question answered Are meds being taken as prescribed? Yes Patient should review meds with caller to confirm 3. Have there been any falls? No 4. Has Home Health been to the house and/or have they contacted you? Yes If not, have you tried to contact them? Can we help you contact them? 5. Are bowels and bladder emptying properly? Yes Are there any unexpected incontinence issues?Nof applicable, is patient following bowel/bladder programs? 6. Any fevers, problems with breathing, unexpected pain? No 7. Are there any skin problems or new areas of breakdown? No 8. Has the patient/family member arranged specialty MD follow up (ie cardiology/neurology/renal/surgical/etc)? Yes  Can we help arrange? 9. Does the patient need any other services or support that we can help arrange? No  10. Are caregivers following through as expected in assisting the patient? Yes 11. Has the patient quit smoking, drinking alcohol, or using drugs as recommended? Yes  Appointment time 1:20 pm, arrive time 1:00 pm with Zella Ball on 09/08/20, then back with Dr. Dagoberto Ligas [address]

## 2020-09-08 ENCOUNTER — Encounter: Payer: MEDICARE | Admitting: Registered Nurse

## 2020-09-08 ENCOUNTER — Encounter: Payer: Self-pay | Admitting: Physical Medicine and Rehabilitation

## 2020-09-08 ENCOUNTER — Other Ambulatory Visit: Payer: Self-pay

## 2020-09-08 ENCOUNTER — Encounter: Payer: MEDICARE | Attending: Registered Nurse | Admitting: Physical Medicine and Rehabilitation

## 2020-09-08 VITALS — BP 122/72 | HR 83 | Temp 98.2°F | Ht 68.0 in | Wt 230.0 lb

## 2020-09-08 DIAGNOSIS — Z993 Dependence on wheelchair: Secondary | ICD-10-CM

## 2020-09-08 DIAGNOSIS — R252 Cramp and spasm: Secondary | ICD-10-CM

## 2020-09-08 DIAGNOSIS — M4714 Other spondylosis with myelopathy, thoracic region: Secondary | ICD-10-CM

## 2020-09-08 DIAGNOSIS — M792 Neuralgia and neuritis, unspecified: Secondary | ICD-10-CM

## 2020-09-08 DIAGNOSIS — G8222 Paraplegia, incomplete: Secondary | ICD-10-CM

## 2020-09-08 HISTORY — DX: Dependence on wheelchair: Z99.3

## 2020-09-08 HISTORY — DX: Cramp and spasm: R25.2

## 2020-09-08 HISTORY — DX: Other spondylosis with myelopathy, thoracic region: M47.14

## 2020-09-08 HISTORY — DX: Paraplegia, incomplete: G82.22

## 2020-09-08 MED ORDER — ENOXAPARIN SODIUM 40 MG/0.4ML ~~LOC~~ SOLN: 40.0000 mg | SUBCUTANEOUS | 0 refills | Status: DC

## 2020-09-08 MED ORDER — BACLOFEN 5 MG PO TABS: 10.0000 mg | ORAL_TABLET | Freq: Three times a day (TID) | ORAL | 5 refills | Status: DC

## 2020-09-08 NOTE — Progress Notes (Signed)
Subjective:    Patient ID: Randall Mccormick, male    DOB: 01/05/1956, 65 y.o.   MRN: [id number]  HPI  Pt is a 65 yr old male with  Hx of incomplete paraplegia/thoracic myelopathy- s/p decompression and fusion. He als ohas increased risk of DVT, nerve and post op pain, Neurogenic bowel and bladder, here for hospital f/u for his paraplegia.   Things going well Got PT coming into the home 2x/week- OT to start in 2 weeks.   Got HEP doing- 3-4x/day. Don't walk without PT there.  Walked yesterday with PT- 30 ft x1 and walked altogether- 120 ft total.   Still has to see where feet are in space.  Cannot tell where feet are in space.    Bowel- going regular-  feeling that he's going- can hold it now- ~ 1 hour.   Bladder- like always been- when has to go- "[place] go, gotta go".   Spasticity- having bad muscles spasms at night- starts in recliner at ~ 7pm- will extend and then whole leg starts jerking.  The only time he takes the Oxy is when having spasms. Takes  Baclofen 5 mg 2-3x/day- is written as prn QID.   BP is good now- because took BP meds-- restarted- because BP was higher.    Not taking Morphine- /MS Contin- And only taking Oxycodone- 2x/day- due to spasms more than anything.   Quit taking Flomax - is peeing well- not having to  go again 20-30 minutes later.   Pain Inventory Average Pain 2 Pain Right Now 0 My pain is intermittent  In the last 24 hours, has pain interfered with the following? General activity 0 Relation with others 0 Enjoyment of life 0 What TIME of day is your pain at its worst? varies Sleep (in general) Fair  Pain is worse with: pain is minimal Pain improves with: pain is minimal Relief from Meds: 8  walk with assistance use a walker how many minutes can you walk? 5 ability to climb steps?  no do you drive?  no use a wheelchair  disabled: date disabled . retired I need assistance with the following:  dressing, bathing, toileting, meal prep and  household duties  weakness trouble walking anxiety  tc  tc    Family History  Problem Relation Age of Onset  . Cancer Mother        BREAST  . Heart disease Father   . Alcoholism Father   . Cancer Son        LEUKEMIA   Social History   Socioeconomic History  . Marital status: Married    Spouse name: Not on file  . Number of children: Not on file  . Years of education: Not on file  . Highest education level: Not on file  Occupational History  . Not on file  Tobacco Use  . Smoking status: Never Smoker  . Smokeless tobacco: Current User    Types: Chew  Substance and Sexual Activity  . Alcohol use: Yes    Comment: 6-8 BEERS A NIGHT  . Drug use: Not on file  . Sexual activity: Not on file  Other Topics Concern  . Not on file  Social History Narrative  . Not on file   Social Determinants of Health   Financial Resource Strain: Not on file  Food Insecurity: Not on file  Transportation Needs: Not on file  Physical Activity: Not on file  Stress: Not on file  Social Connections: Not on file  Past Surgical History:  Procedure Laterality Date  . APPENDECTOMY    . CHOLECYSTECTOMY  2010   [facility]  . TONSILLECTOMY     Past Medical History:  Diagnosis Date  . Anxiety   . Cobalamin deficiency   . Degeneration of lumbar or lumbosacral intervertebral disc   . Depression   . Gouty arthropathy, unspecified   . Hemorrhoids   . Hyperlipidemia   . Hypertension   . Multiple fractures of ribs of left side 04/20/13   FALL FROM A LADDER ON   . Vitamin D deficiency    BP 122/72   Pulse 83   Temp 98.2 F (36.8 C)   Ht 5\' 8"  (1.727 m)   Wt 230 lb (104.3 kg)   SpO2 93%   BMI 34.97 kg/m   Opioid Risk Score:   Fall Risk Score:  `1  Depression screen PHQ 2/9  No flowsheet data found.  Review of Systems  Constitutional: Negative.   HENT: Negative.   Eyes: Negative.   Respiratory: Negative.   Cardiovascular: Negative.   Gastrointestinal: Negative.    Endocrine: Negative.   Genitourinary: Negative.   Musculoskeletal: Positive for back pain.       Spasms  Skin: Negative.   Allergic/Immunologic: Negative.   Neurological: Positive for weakness.  Hematological: Bruises/bleeds easily.  Psychiatric/Behavioral: The patient is nervous/anxious.   All other systems reviewed and are negative.      Objective:   Physical Exam  Awake, alert, appropriate, in borrowed manual w/c from [employer], accompanied by wife, NAD HAS AFOs- not wearing today MS: RLE- HF 3-/5, KE 4/5, DF 4/5 and PF 4+5 LLE- HF 3+/5, KE 4+/5, DF 4/5 and PF 4+/5  Neuro: MAS of LEs- LLE- Hip and knee are around 2 and RLE is 1+ in hip and knee- no clonus B/L Sensation is now T11- T 11 is intact-  T12 to S5 is "asleep"- ie decreased to absent.       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 He also has increased risk of DVT, nerve and post op pain, Neurogenic bowel and bladder, here for hospital f/u for his paraplegia. Off Flomax;   1. Switch Baclofen-  5 mg in Am and afternoon and 10 mg at dinner time- if need be, another 10 mg nightly as needed.  Can cause constipation- and sedation/sleepiness.   2. Uses tone/tightness to  stand upright- so can only go up on spasticity medicine so much- have to BALANCE the tightness/tone and walking- and spasticity can get worse for up to 1-2 years.   3. Suggest trying to hold Oxycodone and wait 1 hour AFTER Baclofen to see if needs Oxy.   4. Don't take MS Contin/Morphine since long acting since doesn't need.   5. Went over ASIA designation- and explained he is ASIA D- so doing much better- "normal" is ASIA E 5/5 strength.   6. I agree with starting back to BP meds- is likely as improved form SCI, BP issues came back- it's common.   7. Suggest getting PCP to refill Oxycodone- if there's a problem, let me  know-   8. F/u in 6 weeks.   9. PCP refills Gabapentin/Duloxetine and BP meds.  10. Will refill Lovenox for another 15 days and then will be done. - Signs of DVT/clot, would swell more than the other.  I spent a total of 30 minutes on visit- as detailed above.

## 2020-09-08 NOTE — Patient Instructions (Signed)
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 He also has increased risk of DVT, nerve and post op pain, Neurogenic bowel and bladder, here for hospital f/u for his paraplegia. Off Flomax;   1. Switch Baclofen-  5 mg in Am and afternoon and 10 mg at dinner time- if need be, another 10 mg nightly as needed.  Can cause constipation- and sedation/sleepiness.   2. Uses tone/tightness to  stand upright- so can only go up on spasticity medicine so much- have to BALANCE the tightness/tone and walking- and spasticity can get worse for up to 1-2 years.   3. Suggest trying to hold Oxycodone and wait 1 hour AFTER Baclofen to see if needs Oxy.   4. Don't take MS Contin/Morphine since long acting since doesn't need.   5. Went over ASIA designation- and explained he is ASIA D- so doing much better- "normal" is ASIA E 5/5 strength.   6. I agree with starting back to BP meds- is likely as improved form SCI, BP issues came back- it's common.   7. Suggest getting PCP to refill Oxycodone- if there's a problem, let me know-   8. F/u in 6 weeks.   9. PCP refills Gabapentin/Duloxetine and BP meds.  10. Will refill Lovenox for another 15 days and then will be done. - Signs of DVT/clot, would swell more than the other.

## 2020-09-14 ENCOUNTER — Encounter: Payer: MEDICARE | Admitting: Registered Nurse

## 2020-11-01 ENCOUNTER — Other Ambulatory Visit: Payer: Self-pay

## 2020-11-01 ENCOUNTER — Encounter: Payer: Self-pay | Admitting: Physical Medicine and Rehabilitation

## 2020-11-01 ENCOUNTER — Encounter: Payer: MEDICARE | Attending: Registered Nurse | Admitting: Physical Medicine and Rehabilitation

## 2020-11-01 VITALS — BP 147/83 | HR 86 | Temp 98.2°F | Ht 68.0 in | Wt 229.6 lb

## 2020-11-01 DIAGNOSIS — G894 Chronic pain syndrome: Secondary | ICD-10-CM | POA: Diagnosis present

## 2020-11-01 DIAGNOSIS — G8222 Paraplegia, incomplete: Secondary | ICD-10-CM | POA: Diagnosis present

## 2020-11-01 DIAGNOSIS — K592 Neurogenic bowel, not elsewhere classified: Secondary | ICD-10-CM | POA: Insufficient documentation

## 2020-11-01 DIAGNOSIS — R252 Cramp and spasm: Secondary | ICD-10-CM | POA: Insufficient documentation

## 2020-11-01 MED ORDER — BACLOFEN 10 MG PO TABS: 10.0000 mg | ORAL_TABLET | Freq: Four times a day (QID) | ORAL | 5 refills | Status: DC

## 2020-11-01 NOTE — Progress Notes (Signed)
Subjective:    Patient ID: Randall Mccormick, male    DOB: 1955-07-17, 65 y.o.   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 He also has increased risk of DVT, nerve and post op pain, Neurogenic bowel and bladder.   Here for f/u on SCI and bowel/bladder.   Pain "doing good"- no back pain, just leg pain.  Upper thighs- tingling/burning combo.  On Gabapentin 600 mg TID- occ will forget middle of day dose if not hurting.   Still taking Baclofen- 5 mg TID as well- thinks will stay where he is, HOWEVER complaining a lot more with spasms at night. When sits down and puts feet up after being active during day, spasms start.   Takes Oxycodone at night- will ease off after 60-90 minutes.  Still taking 10 mg 3x/day and occ BID if doesn't hurt too much.  Family doctor write for Oxycodone. For chronic back pain.   Walking during the day- around the house, down the halls, and kitchen, etc. Does that 2-3x/day with PT.  PT taking around the driveway and to the road. Still doing H/H  2x/week and OT 2x/week.  Signing him up for 2 more months.   Doesn't go out of the house for any other reason- not going to [place]- well- feels likes emptying- goes a lot, but drinks a lot of water.  Bowels- still using Laxative prn and suppository- q2 days.  Got better since initially.  Husband/PT is asking for things "all day".   Needs him to be more independent.   Pain Inventory Average Pain 3 Pain Right Now 4 My pain is intermittent  LOCATION OF PAIN thigh upper leg  BOWEL Number of stools per week: 2 Oral laxative use Yes  Type of laxative pills Enema or suppository use Yes  History of colostomy No  Incontinent No   BLADDER Normal In and out cath, frequency  Able to self cath Bladder incontinence Frequent urination Yes  Leakage with coughing  Difficulty  starting stream  Incomplete bladder emptying    Mobility walk with assistance use a walker ability to climb steps?  no do you drive?  no use a wheelchair transfers alone  Function disabled: date disabled . retired I need assistance with the following:  dressing, bathing, toileting, meal prep, household duties and shopping  Neuro/Psych weakness numbness tingling trouble walking spasms  Prior Studies Any changes since last visit?  no  Physicians involved in your care Any changes since last visit?  no   Family History  Problem Relation Age of Onset  . Cancer Mother        BREAST  . Heart disease Father   . Alcoholism Father   . Cancer Son        LEUKEMIA   Social History   Socioeconomic History  . Marital status: Married    Spouse name: Not on file  . Number of children: Not on file  . Years of education: Not on file  . Highest education level: Not on file  Occupational History  . Not on file  Tobacco Use  . Smoking status: Never Smoker  . Smokeless tobacco: Current User    Types: Chew  Substance and Sexual Activity  . Alcohol use: Yes    Comment: 6-8 BEERS A NIGHT  . Drug use: Not on file  . Sexual  activity: Not on file  Other Topics Concern  . Not on file  Social History Narrative  . Not on file   Social Determinants of Health   Financial Resource Strain: Not on file  Food Insecurity: Not on file  Transportation Needs: Not on file  Physical Activity: Not on file  Stress: Not on file  Social Connections: Not on file   Past Surgical History:  Procedure Laterality Date  . APPENDECTOMY    . CHOLECYSTECTOMY  2010   [facility]  . TONSILLECTOMY     Past Medical History:  Diagnosis Date  . Anxiety   . Cobalamin deficiency   . Degeneration of lumbar or lumbosacral intervertebral disc   . Depression   . Gouty arthropathy, unspecified   . Hemorrhoids   . Hyperlipidemia   . Hypertension   . Multiple fractures of ribs of left side  04/20/13   FALL FROM A LADDER ON   . Vitamin D deficiency    BP (!) 147/83   Pulse 86   Temp 98.2 F (36.8 C)   Ht 5\' 8"  (1.727 m)   Wt 229 lb 9.6 oz (104.1 kg)   SpO2 92%   BMI 34.91 kg/m   Opioid Risk Score:   Fall Risk Score:  `1  Depression screen PHQ 2/9  Depression screen [facility] 2/9 11/01/2020 09/08/2020  Decreased Interest 0 0  Down, Depressed, Hopeless 0 0  PHQ - 2 Score 0 0  Altered sleeping - 0  Tired, decreased energy - 0  Change in appetite - 0  Feeling bad or failure about yourself  - 0  Trouble concentrating - 0  Moving slowly or fidgety/restless - 0  Suicidal thoughts - 0  PHQ-9 Score - 0  Difficult doing work/chores - Not difficult at all   Review of Systems  Constitutional: Negative.   HENT: Negative.   Eyes: Negative.   Respiratory: Positive for cough.   Cardiovascular: Negative.   Gastrointestinal: Positive for constipation.  Endocrine: Negative.   Genitourinary: Positive for frequency.  Musculoskeletal: Positive for gait problem and myalgias.       Spasms  Skin: Negative.   Allergic/Immunologic: Negative.   Neurological: Positive for numbness.       Tingling  Hematological: Negative.   Psychiatric/Behavioral: Negative.   All other systems reviewed and are negative.      Objective:   Physical Exam  Awake, alert, more interactive, in transport w/c, didn't bring RW, NAD MS: HF 4-/5 on L 4/5 on R; KE 4+/5 B/L; DF and PF 5-/5 B/L  Neuro: Mild decrease in sensation to light touch from T12 down.  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 He also has increased risk of DVT, nerve and post op pain, Neurogenic bowel and bladder.   1. Will comppromise and increase Baclofen night time dose, but maintain dose during day.  So 10 mg in Am and afternoon and 20 mg at night. But Rx will say 10 mg 4x/day, just take  as we discussed.   2. Wants to get rid of RW, and wife wants pt to get rid of W/C- pt still working with H/H PT and OT for 2 more months.    3. Needs to get out of house- 2x/week- is a compromise since I want him out 4x/week.   4. Con't  suppository and prn laxatives.    5. It helps the intimacy and relationship in general- to do as much for yourself as possible.  And will also get better faster if you do for yourself.    6. F/U in 3 months- double appointment. -call me if needs higher dose for spasticity.   7. 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  Don't see that he can work for the foreseeable future due to incomplete paraplegia.

## 2020-11-01 NOTE — Patient Instructions (Signed)
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 He also has increased risk of DVT, nerve and post op pain, Neurogenic bowel and bladder.   1. Will comppromise and increase Baclofen night time dose, but maintain dose during day.  So 10 mg in Am and afternoon and 20 mg at night. But Rx will say 10 mg 4x/day, just take as we discussed. Call me if needs higher dose.   2. Wants to get rid of RW, and wife wants pt to get rid of W/C- pt still working with H/H PT and OT for 2 more months.    3. Needs to get out of house- 2x/week- is a compromise since I want him out 4x/week.   4. Con't suppository and prn laxatives.    5. It helps the intimacy and relationship in general- to do as much for yourself as possible.  And will also get better faster if you do for yourself.    6. F/U in 3 months- double appointment.

## 2020-11-02 ENCOUNTER — Telehealth: Payer: Self-pay | Admitting: *Deleted

## 2020-11-02 NOTE — Telephone Encounter (Signed)
Stephanie OT [facility] called for POC 1wk1, 2wk6.  Approval given.

## 2020-11-15 ENCOUNTER — Other Ambulatory Visit: Payer: Self-pay | Admitting: Physician Assistant

## 2020-11-15 DIAGNOSIS — M4714 Other spondylosis with myelopathy, thoracic region: Secondary | ICD-10-CM

## 2020-11-15 DIAGNOSIS — R29898 Other symptoms and signs involving the musculoskeletal system: Secondary | ICD-10-CM

## 2020-11-15 DIAGNOSIS — M4325 Fusion of spine, thoracolumbar region: Secondary | ICD-10-CM

## 2020-11-17 ENCOUNTER — Telehealth: Payer: Self-pay | Admitting: Physical Medicine and Rehabilitation

## 2020-11-17 NOTE — Telephone Encounter (Signed)
Patient called, states he left ppwk with doctor at visit on 11/01/20 that needed completed and faxed for disability. We don't have it at the front desk, please advise if you still have ppwk or if we need new copies from the patient.

## 2020-12-07 ENCOUNTER — Ambulatory Visit
Admission: RE | Admit: 2020-12-07 | Discharge: 2020-12-07 | Disposition: A | Discharge status: Home / Self Care | Payer: MEDICARE | Source: Ambulatory Visit | Attending: Physician Assistant | Admitting: Physician Assistant

## 2020-12-07 DIAGNOSIS — R29898 Other symptoms and signs involving the musculoskeletal system: Secondary | ICD-10-CM

## 2020-12-07 DIAGNOSIS — M4714 Other spondylosis with myelopathy, thoracic region: Secondary | ICD-10-CM

## 2020-12-07 DIAGNOSIS — M4325 Fusion of spine, thoracolumbar region: Secondary | ICD-10-CM

## 2020-12-07 MED ORDER — GADOBENATE DIMEGLUMINE 529 MG/ML IV SOLN
20.0000 mL | Freq: Once | INTRAVENOUS | Status: AC | PRN
  Administered 2020-12-07: 20 mL via INTRAVENOUS

## 2021-01-04 ENCOUNTER — Telehealth: Payer: Self-pay

## 2021-01-04 NOTE — Telephone Encounter (Signed)
Patient is calling for an increase in Baclofen. Requesting 2 tablet four times a day.

## 2021-01-15 ENCOUNTER — Telehealth: Payer: Self-pay

## 2021-01-15 MED ORDER — BACLOFEN 10 MG PO TABS: 15.0000 mg | ORAL_TABLET | Freq: Four times a day (QID) | ORAL | 5 refills | Status: DC

## 2021-01-15 NOTE — Telephone Encounter (Signed)
Pt insists needs ot increase Baclofen- will increase to 15 mg 4x/day x 1 week, and if need be, can increase to 20 mg 4x/day- for spasticity

## 2021-01-15 NOTE — Telephone Encounter (Signed)
Patient wife called Baclofen need increase to 2 tablets four times a day. [facility] in [address]

## 2021-01-17 NOTE — Telephone Encounter (Signed)
Notified. 

## 2021-02-28 ENCOUNTER — Other Ambulatory Visit: Payer: Self-pay | Admitting: *Deleted

## 2021-02-28 MED ORDER — BACLOFEN 10 MG PO TABS: 20.0000 mg | ORAL_TABLET | Freq: Four times a day (QID) | ORAL | 5 refills | Status: DC

## 2021-03-07 NOTE — Telephone Encounter (Signed)
done

## 2021-03-16 ENCOUNTER — Encounter: Payer: MEDICARE | Admitting: Physical Medicine and Rehabilitation

## 2021-03-28 ENCOUNTER — Encounter: Payer: Self-pay | Admitting: Physical Medicine and Rehabilitation

## 2021-03-28 ENCOUNTER — Encounter: Payer: MEDICARE | Attending: Physical Medicine and Rehabilitation | Admitting: Physical Medicine and Rehabilitation

## 2021-03-28 ENCOUNTER — Other Ambulatory Visit: Payer: Self-pay

## 2021-03-28 VITALS — BP 134/73 | HR 100 | Temp 99.8°F | Ht 68.0 in | Wt 230.0 lb

## 2021-03-28 DIAGNOSIS — R252 Cramp and spasm: Secondary | ICD-10-CM | POA: Insufficient documentation

## 2021-03-28 DIAGNOSIS — G8222 Paraplegia, incomplete: Secondary | ICD-10-CM | POA: Insufficient documentation

## 2021-03-28 DIAGNOSIS — M6281 Muscle weakness (generalized): Secondary | ICD-10-CM

## 2021-03-28 DIAGNOSIS — N319 Neuromuscular dysfunction of bladder, unspecified: Secondary | ICD-10-CM | POA: Diagnosis present

## 2021-03-28 DIAGNOSIS — R339 Retention of urine, unspecified: Secondary | ICD-10-CM

## 2021-03-28 DIAGNOSIS — R269 Unspecified abnormalities of gait and mobility: Secondary | ICD-10-CM | POA: Diagnosis present

## 2021-03-28 HISTORY — DX: Retention of urine, unspecified: R33.9

## 2021-03-28 HISTORY — DX: Muscle weakness (generalized): M62.81

## 2021-03-28 MED ORDER — BACLOFEN 10 MG PO TABS: 20.0000 mg | ORAL_TABLET | Freq: Four times a day (QID) | ORAL | 5 refills | Status: DC

## 2021-03-28 MED ORDER — TAMSULOSIN HCL 0.4 MG PO CAPS: 0.8000 mg | ORAL_CAPSULE | ORAL | 5 refills | Status: DC

## 2021-03-28 NOTE — Patient Instructions (Signed)
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 He also has increased risk of DVT, nerve and post op pain, Neurogenic bowel and bladder- with urinary retention since stopped Flomax.   Here for f/u on SCI and spasticity.   1 Will restart Flomax 04 mg with dinner- and then in 1 week, IF NEEDED, can increase to 0.8 mg (2 tabs) nightly-- only increase if Symptoms haven't resolved.    2. If Sx's don't get better in 3-4 weeks- call me and I can place referral to Urology- Dr Matilde Sprang-    3. Baclofen for spasticity- take 20 mg in Am; 20 mg in afternoon and 40 mg (not 20 mg) at night.  4. Needs to increase distance in walking- EVERY DAY! This is a prescription!!! Unless it's pouring rain- sprinkling doesn't count.  Needs to get up even if doesn't feel like it- need to think of long term gains, not short term discomfort.     5. Get pedal stationary bike- sit in front of it- and use 20+ minutes on days he cannot get out to walk.    6. Educated on spasticity- gets worse for up to 2 years- think it will stabilize much earlier than that- but will have to follow it to get an idea if getting worse.    7. Don't use W/C in house at all.  But needs an assistive device. Uses RW to get around.    8. F/U in 3 months- double visit-

## 2021-03-28 NOTE — Progress Notes (Signed)
Subjective:    Patient ID: Randall Mccormick, male    DOB: 28-Jan-1956, 65 y.o.   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 He also has increased risk of DVT, nerve and post op pain, Neurogenic bowel and bladder.   Here for f/u on SCI and spasticity.   [place] going into front door-today- also  fell 2 weeks ago- has been walking, but has required w/c for long distances lately.    Has been getting out of the house 2x/week- goes to therapy and occ goes out to eat.    Still doing outpt PT- 2x/week- PT- and occ some OT.   Stopped bowel program and suppository occ takes a laxative if goes without for 3-4 days. No accidents since stopped it- hasn't used since last seen.  Dulcolax 10 mg (2 pills) when needs it.   Urinating - some trouble-  Goes back and forth between needing to go frequently- q 30 minutes- and then also cannot go when needs to go.   Flomax ran out and just didn't ask for refill.    Taking Baclofen 20 mg TID- but doesn't know how ot take Baclofen- was confused on order.   Spasms at night, but they don't wake him up.   When he fell-2 weeks-  needed paramedics to get up of ground- sat too far out in w/c-  Was confused- didn't know if was before fall or after.  Confusion lasted 1 hour or so.  Pt doesn't think had seizure-  Wife didn't see any seizure activity. Wondering if was a TIA.  Has been fine ever since.   Has been walking daily in house- a lot in house- and wife tries every day to go outside- goes out every day for appointments, but to go out to walk- 3x week- walks ~ 10 minutes.            Pain Inventory Average Pain 3 Pain Right Now 3 My pain is constant, sharp, burning, dull, stabbing, tingling, and aching  LOCATION OF PAIN  back and leg  BOWEL Number of stools per week: 3 Oral laxative use Yes  Type of laxative  dulcolax Enema or suppository use Yes  History of colostomy No  Incontinent No   BLADDER Normal Frequent urination Yes  Leakage with coughing No  Difficulty starting stream No  Incomplete bladder emptying Yes    Mobility use a walker how many minutes can you walk? 5 ability to climb steps?  no do you drive?  no transfers alone  Function not employed: date last employed . I need assistance with the following:  dressing, bathing, meal prep, household duties, and shopping  Neuro/Psych weakness numbness tremor tingling trouble walking spasms anxiety  Prior Studies Any changes since last visit?  no  Physicians involved in your care Any changes since last visit?  no   Family History  Problem Relation Age of Onset   Cancer Mother        BREAST   Heart disease Father    Alcoholism Father    Cancer Son        LEUKEMIA   Social History   Socioeconomic History   Marital status: Married    Spouse name: Not on file   Number of children: Not on file   Years of education: Not on file   Highest education level: Not  on file  Occupational History   Not on file  Tobacco Use   Smoking status: Never   Smokeless tobacco: Current    Types: Chew  Substance and Sexual Activity   Alcohol use: Yes    Comment: 6-8 BEERS A NIGHT   Drug use: Not on file   Sexual activity: Not on file  Other Topics Concern   Not on file  Social History Narrative   Not on file   Social Determinants of Health   Financial Resource Strain: Not on file  Food Insecurity: Not on file  Transportation Needs: Not on file  Physical Activity: Not on file  Stress: Not on file  Social Connections: Not on file   Past Surgical History:  Procedure Laterality Date   APPENDECTOMY     CHOLECYSTECTOMY  2010   [place].   TONSILLECTOMY     Past Medical History:  Diagnosis Date   Anxiety    Cobalamin deficiency    Degeneration of lumbar or lumbosacral intervertebral disc    Depression     Gouty arthropathy, unspecified    Hemorrhoids    Hyperlipidemia    Hypertension    Multiple fractures of ribs of left side 04/20/13   FALL FROM A LADDER ON    Vitamin D deficiency    BP 134/73   Pulse 100   Temp 99.8 F (37.7 C)   Ht 5\' 8"  (1.727 m)   Wt 230 lb (104.3 kg) Comment: reported  SpO2 90%   BMI 34.97 kg/m   Opioid Risk Score:   Fall Risk Score:  `1  Depression screen PHQ 2/9  Depression screen [facility] 2/9 03/28/2021 11/01/2020 09/08/2020  Decreased Interest 0 0 0  Down, Depressed, Hopeless 0 0 0  PHQ - 2 Score 0 0 0  Altered sleeping - - 0  Tired, decreased energy - - 0  Change in appetite - - 0  Feeling bad or failure about yourself  - - 0  Trouble concentrating - - 0  Moving slowly or fidgety/restless - - 0  Suicidal thoughts - - 0  PHQ-9 Score - - 0  Difficult doing work/chores - - Not difficult at all     Review of Systems  Constitutional: Negative.   HENT: Negative.    Eyes: Negative.   Respiratory: Negative.    Cardiovascular:  Positive for leg swelling.  Gastrointestinal:  Positive for constipation.  Endocrine: Negative.   Genitourinary:  Positive for difficulty urinating and frequency.  Musculoskeletal:  Positive for back pain and gait problem.       Spasms  Skin: Negative.   Allergic/Immunologic: Negative.   Neurological:  Positive for tremors, weakness and numbness.       Tingling  Hematological:  Bruises/bleeds easily.  Psychiatric/Behavioral:  The patient is nervous/anxious.   All other systems reviewed and are negative.     Objective:   Physical Exam  Awake, alert, listless;tired; accompanied by wife; in manual wc; NAD MS: HF 4-/5 B/L; KE/KF 4/5; DF and PF 4-/5 B/L  Neuro: No clonus No decreased sensation to light touch in LE's B/L MAS of 1 in RLE and MAS of 2 in LLE  3-4+ LE edema to mid calf B/L       Assessment & Plan:   Pt is a 65 yr old male with  Hx of incomplete paraplegia/thoracic myelopathy- now t T11 ASIA D  incomplete paraplegia- - s/p decompression and fusion. He also has increased risk of DVT, nerve and post op  pain, Neurogenic bowel and bladder- with urinary retention since stopped Flomax.   Here for f/u on SCI and spasticity.   1 Will restart Flomax 04 mg with dinner- and then in 1 week, IF NEEDED, can increase to 0.8 mg (2 tabs) nightly-- only increase if Symptoms haven't resolved.    2. If Sx's don't get better in 3-4 weeks- call me and I can place referral to Urology- Dr Matilde Sprang-    3. Baclofen for spasticity- take 20 mg in Am; 20 mg in afternoon and 40 mg (not 20 mg) at night.  4. Needs to increase distance in walking- EVERY DAY! This is a prescription!!! Unless it's pouring rain- sprinkling doesn't count.  Needs to get up even if doesn't feel like it- need to think of long term gains, not short term discomfort.     5. Get pedal stationary bike- sit in front of it- and use 20+ minutes on days he cannot get out to walk.    6. Educated on spasticity- gets worse for up to 2 years- think it will stabilize much earlier than that- but will have to follow it to get an idea if getting worse.    7. Don't use W/C in house at all.  But needs an assistive device. Uses RW to get around.    8. F/U in 3 months- double visit-   I spent a total of 32 minutes on visit- specifically encouraging pt to work on getting better- and discussing ways to build endurance and strength.

## 2021-05-25 ENCOUNTER — Ambulatory Visit: Payer: MEDICARE | Admitting: Physical Medicine and Rehabilitation

## 2021-06-29 ENCOUNTER — Other Ambulatory Visit: Payer: Self-pay

## 2021-06-29 ENCOUNTER — Encounter: Payer: Self-pay | Admitting: Physical Medicine and Rehabilitation

## 2021-06-29 ENCOUNTER — Encounter: Payer: MEDICARE | Attending: Physical Medicine and Rehabilitation | Admitting: Physical Medicine and Rehabilitation

## 2021-06-29 VITALS — BP 137/75 | HR 95 | Temp 98.1°F | Ht 68.0 in | Wt 241.0 lb

## 2021-06-29 DIAGNOSIS — M6281 Muscle weakness (generalized): Secondary | ICD-10-CM | POA: Insufficient documentation

## 2021-06-29 DIAGNOSIS — R252 Cramp and spasm: Secondary | ICD-10-CM | POA: Diagnosis present

## 2021-06-29 DIAGNOSIS — R269 Unspecified abnormalities of gait and mobility: Secondary | ICD-10-CM | POA: Insufficient documentation

## 2021-06-29 DIAGNOSIS — R339 Retention of urine, unspecified: Secondary | ICD-10-CM | POA: Diagnosis present

## 2021-06-29 DIAGNOSIS — G8222 Paraplegia, incomplete: Secondary | ICD-10-CM | POA: Diagnosis present

## 2021-06-29 MED ORDER — TAMSULOSIN HCL 0.4 MG PO CAPS: 0.4000 mg | ORAL_CAPSULE | Freq: Every day | ORAL | 5 refills | Status: DC

## 2021-06-29 MED ORDER — BACLOFEN 20 MG PO TABS: 20.0000 mg | ORAL_TABLET | Freq: Four times a day (QID) | ORAL | 5 refills | Status: DC

## 2021-06-29 NOTE — Progress Notes (Signed)
Subjective:    Patient ID: Randall Mccormick, male    DOB: May 04, 1956, 66 y.o.   MRN: [id number]  HPI Pt is a 66 yr old male with  Hx of incomplete paraplegia/thoracic myelopathy- now t T11 ASIA D incomplete paraplegia- - s/p decompression and fusion. He also has increased risk of DVT, nerve and post op pain, Neurogenic bowel and bladder- with urinary retention since stopped Flomax.   Doing OK with walking-  Cannot take a step without without rollator.   Walks around the house with rollator- w/c is now put up.   Walking outside the house ~ 2-3x/week- goes down driveway- 70- then 976 ft 1 way- - 200-400 ft 2-3x/week.   Hasn't gotten pedal bike yet- didn't remember that we discussed it.   Went to [place]- and flew then cruise then tour bus- went OK- little problem with bathrooms, but otherwise, went well- able to get up and down from bus and had scooter.    Bladder got better when restarted Flomax- think taking 1 tab/day. Per pt and wife.    Spasticity- it's doing badly- at night, esp if walked during day- legs spasms esp at night- mainly legs, but also low back.   Takes Baclofen- 20 mg TID- not 40 mg at night and 20 mg during day- didn't make the change wrote last visit-  Most of spasms are at night.   Renewed interest in getting out of Rolator/getting into truck- got in for first time yesterday.   R knee injection was done 11/22- series of 3 injections- gel injections- helped for awhile, but has worn off.  R knee hurts all the time- needs surgery, but wants to wait til back stuff taken care of.     Pain Inventory Average Pain 4  Pain Right Now 6  My pain is constant, sharp, burning, stabbing, tingling, and aching  LOCATION OF PAIN  Lower back, right leg, right knee  BOWEL Number of stools per week: 7  BLADDER Normal     Mobility walk with assistance use a walker how many minutes can you walk? 10 minutes ability to climb steps?  no do you drive?  no Do you have  any goals in this area?  yes  Function disabled: date disabled 2019 I need assistance with the following:  dressing, bathing, toileting, meal prep, household duties, and shopping Do you have any goals in this area?  yes  Neuro/Psych weakness numbness tremor tingling trouble walking spasms  Prior Studies Any changes since last visit?  no  Physicians involved in your care Any changes since last visit?  no   Family History  Problem Relation Age of Onset   Cancer Mother        BREAST   Heart disease Father    Alcoholism Father    Cancer Son        LEUKEMIA   Social History   Socioeconomic History   Marital status: Married    Spouse name: Not on file   Number of children: Not on file   Years of education: Not on file   Highest education level: Not on file  Occupational History   Not on file  Tobacco Use   Smoking status: Never   Smokeless tobacco: Current    Types: Chew  Vaping Use   Vaping Use: Not on file  Substance and Sexual Activity   Alcohol use: Yes    Comment: 6-8 BEERS A NIGHT   Drug use: Not on file  Sexual activity: Not on file  Other Topics Concern   Not on file  Social History Narrative   Not on file   Social Determinants of Health   Financial Resource Strain: Not on file  Food Insecurity: Not on file  Transportation Needs: Not on file  Physical Activity: Not on file  Stress: Not on file  Social Connections: Not on file   Past Surgical History:  Procedure Laterality Date   APPENDECTOMY     CHOLECYSTECTOMY  2010   [place].   TONSILLECTOMY     Past Medical History:  Diagnosis Date   Anxiety    Cobalamin deficiency    Degeneration of lumbar or lumbosacral intervertebral disc    Depression    Gouty arthropathy, unspecified    Hemorrhoids    Hyperlipidemia    Hypertension    Multiple fractures of ribs of left side 04/20/13   FALL FROM A LADDER ON    Vitamin D deficiency    BP 137/75    Pulse 95    Temp 98.1 F (36.7 C)     Ht 5\' 8"  (1.727 m)    Wt 241 lb (109.3 kg)    SpO2 96%    BMI 36.64 kg/m   Opioid Risk Score:   Fall Risk Score:  `1  Depression screen PHQ 2/9  Depression screen [facility] 2/9 03/28/2021 11/01/2020 09/08/2020  Decreased Interest 0 0 0  Down, Depressed, Hopeless 0 0 0  PHQ - 2 Score 0 0 0  Altered sleeping - - 0  Tired, decreased energy - - 0  Change in appetite - - 0  Feeling bad or failure about yourself  - - 0  Trouble concentrating - - 0  Moving slowly or fidgety/restless - - 0  Suicidal thoughts - - 0  PHQ-9 Score - - 0  Difficult doing work/chores - - Not difficult at all    Review of Systems  Musculoskeletal:  Positive for back pain. Negative for gait problem.       Leg pain  All other systems reviewed and are negative.     Objective:   Physical Exam Awake, alert, appropriate,  wants ot get rid of Rollator; renewed interest- per wife; using rollator not w/c today; NAD Accompanied by wife  MS: RLE- HF 4-/5, KE 4/5 and KF 4/5; DF 4-/5, and PF 4+/5 LLE- HF 4+/5; KE/KF 4+/5; DF 4/5 and PF 4+/5  Neuro: toes curling on R foot-  Increased tone- MAS of 2-3 in R knee/ankle/hip and MAS of 2 on L side No clonus B/L   Skin: Has some actinic keratosis on back of of R thigh/hamstring- large sized- 4-5 mm across and 4+ mm thick above level of skin.      Assessment & Plan:   Pt is a 66 yr old male with  Hx of incomplete paraplegia/thoracic myelopathy- now t T11 ASIA D incomplete paraplegia- - s/p decompression and fusion. He also has increased risk of DVT, nerve and post op pain, Neurogenic bowel and bladder- with urinary retention since stopped Flomax.    Suggest increasing distance walked. Every 3-4 times, increase distance by 50-100 ft each time.   2. Didn't increase Baclofen as written for- so will increase Baclofen to 20 mg in Am, 20 mg in afternoon and 40 mg at night. - for spasticity. Write for new Rx.    3. Get pedal bike- and use 20 minutes on days you don't walk- one  you can change the resistance on  it.    4.  Do ROM of both feet and ankles, knees and hips- ROM- 5-10 reps/times at least 2-3x/day-  3- max 5 minutes on ROM-   5. Con't Flomax 0.4 mg nightly- for neurogenic bladder.    6. Actinic keratosis- needs to see Derm to get it frozen off as soon as possible- might be progressed to skin cancer or if it's precancer- but needs biopsy asap.   7. F/U in 3 months- double appt.   I spent a total of 31 minutes on total visit- discussing skin precancer vs cancer, spasticity and prognosis. Cannot tell how well will get but my goal short term to is get rid og Rolator in the house.

## 2021-06-29 NOTE — Patient Instructions (Signed)
Pt is a 66 yr old male with  Hx of incomplete paraplegia/thoracic myelopathy- now t T11 ASIA D incomplete paraplegia- - s/p decompression and fusion. He also has increased risk of DVT, nerve and post op pain, Neurogenic bowel and bladder- with urinary retention since stopped Flomax.    Suggest increasing distance walked. Every 3-4 times, increase distance by 50-100 ft each time.   2. Didn't increase Baclofen as written for- so will increase Baclofen to 20 mg in Am, 20 mg in afternoon and 40 mg at night. - for spasticity. Write for new Rx.    3. Get pedal bike- and use 20 minutes on days you don't walk- one you can change the resistance on it.    4.  Do ROM of both feet and ankles, knees and hips- ROM- 5-10 reps/times at least 2-3x/day-  3- max 5 minutes on ROM-   5. Con't Flomax 0.4 mg nightly- for neurogenic bladder.    6. Actinic keratosis- needs to see Derm to get it frozen off as soon as possible- might be progressed to skin cancer or if it's precancer- but needs biopsy asap.   7. F/U in 3 months- double appt.

## 2021-08-06 ENCOUNTER — Telehealth: Payer: Self-pay | Admitting: *Deleted

## 2021-08-06 DIAGNOSIS — N319 Neuromuscular dysfunction of bladder, unspecified: Secondary | ICD-10-CM

## 2021-08-06 DIAGNOSIS — G8220 Paraplegia, unspecified: Secondary | ICD-10-CM

## 2021-08-06 NOTE — Telephone Encounter (Signed)
Mrs Lopezmartinez called and reports the tamsulosin is not working for Randall Mccormick. She is asking what can be done, increase dose or change medication?

## 2021-08-08 NOTE — Telephone Encounter (Signed)
I do not see a referral for Urology.

## 2021-08-08 NOTE — Telephone Encounter (Signed)
Randall Mccormick has been notified of referral.

## 2021-09-24 ENCOUNTER — Encounter: Payer: MEDICARE | Attending: Physical Medicine and Rehabilitation | Admitting: Physical Medicine and Rehabilitation

## 2021-09-24 ENCOUNTER — Encounter: Payer: Self-pay | Admitting: Physical Medicine and Rehabilitation

## 2021-09-24 VITALS — BP 124/77 | HR 96 | Ht 68.0 in | Wt 237.0 lb

## 2021-09-24 DIAGNOSIS — R339 Retention of urine, unspecified: Secondary | ICD-10-CM | POA: Diagnosis present

## 2021-09-24 DIAGNOSIS — G8222 Paraplegia, incomplete: Secondary | ICD-10-CM | POA: Insufficient documentation

## 2021-09-24 DIAGNOSIS — M6281 Muscle weakness (generalized): Secondary | ICD-10-CM | POA: Insufficient documentation

## 2021-09-24 DIAGNOSIS — R269 Unspecified abnormalities of gait and mobility: Secondary | ICD-10-CM | POA: Diagnosis present

## 2021-09-24 DIAGNOSIS — R252 Cramp and spasm: Secondary | ICD-10-CM | POA: Insufficient documentation

## 2021-09-24 MED ORDER — TAMSULOSIN HCL 0.4 MG PO CAPS: 0.4000 mg | ORAL_CAPSULE | Freq: Every day | ORAL | 1 refills | Status: DC

## 2021-09-24 MED ORDER — BACLOFEN 20 MG PO TABS: 20.0000 mg | ORAL_TABLET | Freq: Four times a day (QID) | ORAL | 1 refills | Status: DC

## 2021-09-24 NOTE — Progress Notes (Signed)
? ?Subjective:  ? ? Patient ID: Randall Mccormick, male    DOB: [birth year], 66 y.o.   MRN: [id number] ? ?HPI ?Pt is a 66 yr old male with  Hx of incomplete paraplegia/thoracic myelopathy- now t T11 ASIA D incomplete paraplegia- - s/p decompression and fusion. He also has increased risk of DVT, nerve and post op pain, Neurogenic bowel and bladder- with urinary retention since stopped Flomax.  ? ? ?Here for f/u on paraplegia.  ? ? ?Still using rollator to get around.  ?Walks for 15 minutes at a time-  ?Was doing 10 minutes at a time last time- now 700 ft total per pt.  ? ? ?Increased Baclofen - seems to be doing a little better.  ?Legs feel less tight at night esp.  ? ?Restarted Flomax- 1 pill/day. At night.  ?Peeing better- no trouble sometimes cannot make it to bathroom.  ?~ 1x/q 2 weeks.  ? ?Can make it to [place], [place] ?Bathroom is the main reason hard to take him out anywhere.  ?Gotten better, so can take him out more.  ? ? ?Wants to  get more strength in legs-  ?Feet fall off pedal bike- - right foot still falls off, even with strap. So wasn't using.  ? ? ?PT used to ace wrap foot to pedals, but wasn't doing so far at home ? ?Fell 1 week ago- wife grabbed walker- doesn't know why- and straightened back the walker, and lost his balance- didn't hit his head! ?Didn't get hurt- pride more than anything hurt.  ?Did get deep cut on R hand- skin torn-  ? ? ? ? ? ?Pain Inventory ?Average Pain 6 ?Pain Right Now 6 ?My pain is constant, sharp, burning, stabbing, and aching ? ?LOCATION OF PAIN  RIGHT HIP, RIGHT LEG, RIGHT THIGH ? ?BOWEL ?Number of stools per week: 4 ? ?BLADDER ?Normal and Pads ?Bladder incontinence Yes  ? ? ? ?Mobility ?use a cane ?use a walker ?how many minutes can you walk? 15 ?ability to climb steps?  no ?do you drive?  no ?Do you have any goals in this area?  yes ? ?Function ?disabled: date disabled 2018 ?I need assistance with the following:  dressing, bathing, meal prep, household duties, and shopping ?Do  you have any goals in this area?  yes ? ?Neuro/Psych ?bladder control problems ?numbness ?tremor ?tingling ?trouble walking ?anxiety ? ?Prior Studies ?Any changes since last visit?  no ? ?Physicians involved in your care ?Any changes since last visit?  no ? ? ?Family History  ?Problem Relation Age of Onset  ? Cancer Mother   ?     BREAST  ? Heart disease Father   ? Alcoholism Father   ? Cancer Son   ?     LEUKEMIA  ? ?Social History  ? ?Socioeconomic History  ? Marital status: Married  ?  Spouse name: Not on file  ? Number of children: Not on file  ? Years of education: Not on file  ? Highest education level: Not on file  ?Occupational History  ? Not on file  ?Tobacco Use  ? Smoking status: Never  ? Smokeless tobacco: Current  ?  Types: Chew  ?Vaping Use  ? Vaping Use: Not on file  ?Substance and Sexual Activity  ? Alcohol use: Yes  ?  Comment: 6-8 BEERS A NIGHT  ? Drug use: Not on file  ? Sexual activity: Not on file  ?Other Topics Concern  ? Not on file  ?Social History  Narrative  ? Not on file  ? ?Social Determinants of Health  ? ?Financial Resource Strain: Not on file  ?Food Insecurity: Not on file  ?Transportation Needs: Not on file  ?Physical Activity: Not on file  ?Stress: Not on file  ?Social Connections: Not on file  ? ?Past Surgical History:  ?Procedure Laterality Date  ? APPENDECTOMY    ? CHOLECYSTECTOMY  2010  ? [facility]  ? TONSILLECTOMY    ? ?Past Medical History:  ?Diagnosis Date  ? Anxiety   ? Cobalamin deficiency   ? Degeneration of lumbar or lumbosacral intervertebral disc   ? Depression   ? Gouty arthropathy, unspecified   ? Hemorrhoids   ? Hyperlipidemia   ? Hypertension   ? Multiple fractures of ribs of left side 04/20/13  ? FALL FROM A LADDER ON   ? Vitamin D deficiency   ? ?There were no vitals taken for this visit. ? ?Opioid Risk Score:   ?Fall Risk Score:  `1 ? ?Depression screen PHQ 2/9 ? ? ?  06/29/2021  ?  1:10 PM 03/28/2021  ?  9:54 AM 11/01/2020  ?  8:19 AM 09/08/2020  ? 12:56 PM   ?Depression screen PHQ 2/9  ?Decreased Interest 0 0 0 0  ?Down, Depressed, Hopeless 0 0 0 0  ?PHQ - 2 Score 0 0 0 0  ?Altered sleeping    0  ?Tired, decreased energy    0  ?Change in appetite    0  ?Feeling bad or failure about yourself     0  ?Trouble concentrating    0  ?Moving slowly or fidgety/restless    0  ?Suicidal thoughts    0  ?PHQ-9 Score    0  ?Difficult doing work/chores    Not difficult at all  ?  ?Review of Systems  ?Genitourinary:   ?     INCONTINENCE  ?Musculoskeletal:  Positive for gait problem.  ?Neurological:  Positive for tremors and numbness.  ?     TINGLING  ?Psychiatric/Behavioral:    ?     ANXIETY  ?All other systems reviewed and are negative. ? ?   ?Objective:  ? Physical Exam ? ?Awake, alert, appropriate, using rollator, accompanied by wife, NAD ? ?MS: ?Strength in legs 4+/5 throughout in legs B/L  ? ?Neuro: ?MAS of 2 in hips and knees B/L - better than last appointment ?No clonus in legs B/L  ? ?   ?Assessment & Plan:  ? ?Pt is a 66 yr old male with  Hx of incomplete paraplegia/thoracic myelopathy- now  T11 ASIA D incomplete paraplegia- - s/p decompression and fusion. He also has increased risk of DVT, nerve and post op pain, Neurogenic bowel and bladder- with urinary retention- restarted Flomax.  ? ? ?Suggest using ACE wrap to keep feet on pedal bike ?Use on days that doesn't walk.  ? ?2. Con't Baclofen 20 mg in Am and afternoon and 40 mg nightly.  Send in 360 - 87month supply and 1 refill.  ? ? ?3. Suggest working on using cane IN the home; but rollator OUTSIDE the home.  ? ? ?4. Getting stronger- continue to walk- to build endurance- add 3 minutes every week to walk- so 18 minutes tomorrow; then next week 21 minutes; then 24- etc.  ? ?5.   Con't Flomax  0.4 mg nightly- sent to mail order pharmacy- 90 #1 ? ?6. F/U in 3 months- SCI ? ? ?I spent a total of  22  minutes on total care today- >50% coordination of care- due to discussion about progressing endurance and walking/distance.  ? ?

## 2021-09-24 NOTE — Patient Instructions (Signed)
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 He also has increased risk of DVT, nerve and post op pain, Neurogenic bowel and bladder- with urinary retention- restarted Flomax.  ? ? ?Suggest using ACE wrap to keep feet on pedal bike ?Use on days that doesn't walk.  ? ?2. Con't Baclofen 20 mg in Am and afternoon and 40 mg nightly.  Send in 360 - 54month supply and 1 refill.  ? ? ?3. Suggest working on using cane IN the home; but rollator OUTSIDE the home.  ? ? ?4. Getting stronger- continue to walk- to build endurance- add 3 minutes every week to walk- so 18 minutes tomorrow; then next week 21 minutes; then 24- etc.  ? ?5.   Con't Flomax  0.4 mg nightly- sent to mail order pharmacy- 90 #1 ? ?6. F/U in 3 months- SCI ?

## 2022-01-14 ENCOUNTER — Encounter: Payer: MEDICARE | Admitting: Physical Medicine and Rehabilitation

## 2022-02-05 IMAGING — MR MR LUMBAR SPINE WO/W CM
4 of 10 series · 24 of 48 positions shown · IV contrast (multihance)
Comparison: CT lumbar spine 06/30/2019 and CT chest 02/09/2020.

CLINICAL DATA: History of extensive thoracolumbar fusion with right
leg weakness.

EXAM:
MRI THORACIC AND LUMBAR SPINE WITHOUT AND WITH CONTRAST
TECHNIQUE: Multiplanar and multiecho pulse sequences of the thoracic and lumbar
spine were obtained without and with intravenous contrast.
CONTRAST:  20mL MULTIHANCE GADOBENATE DIMEGLUMINE 529 MG/ML IV SOLN

[Series 6: T2 · sagittal · 4.0mm · 0.55mm/px · 4 of 15 slices shown (1 of 2)]
[im 1/15]
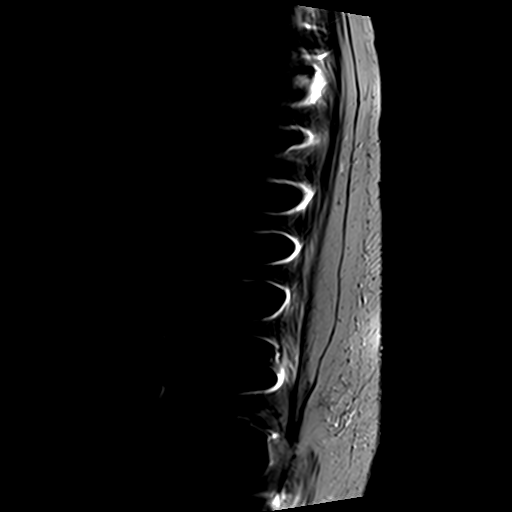
[im 5/15]
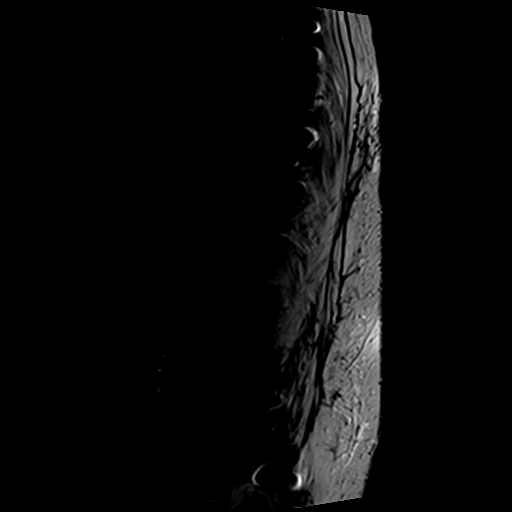
[im 10/15]
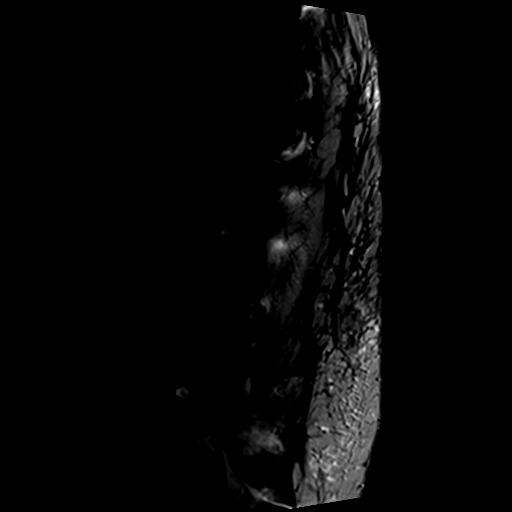
[im 15/15]
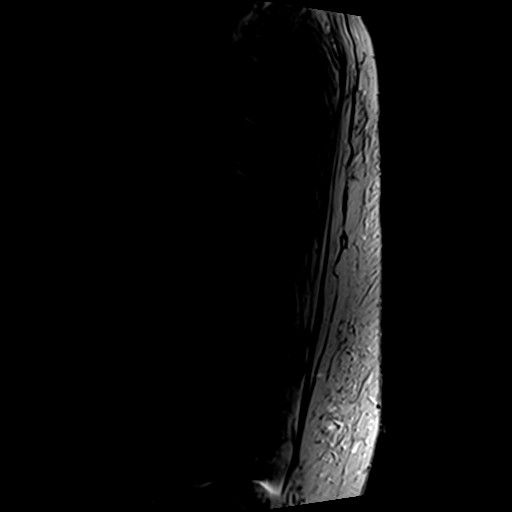

[Series 7: T1 · sagittal · 4.0mm · 0.55mm/px · 4 of 15 slices shown (1 of 2)]
[im 1/15]
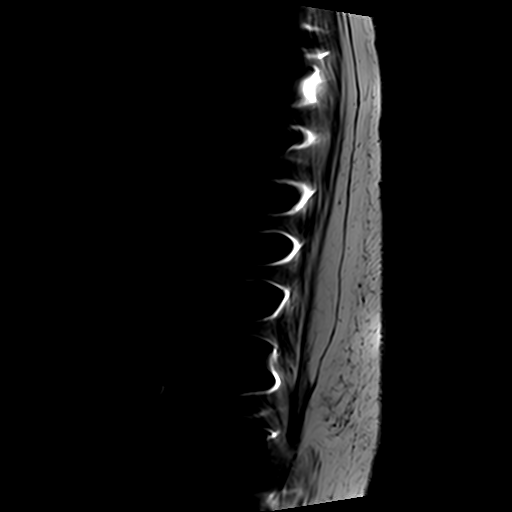
[im 5/15]
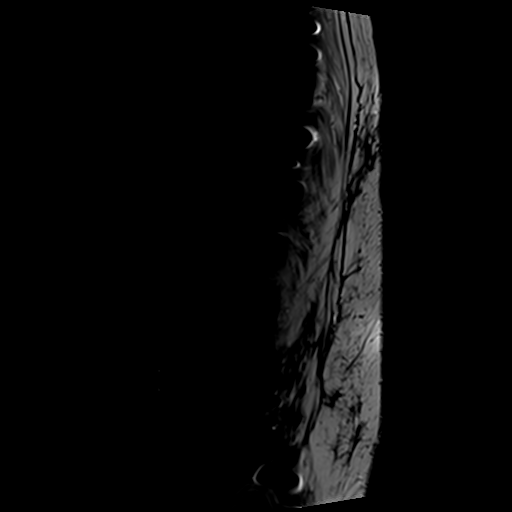
[im 10/15]
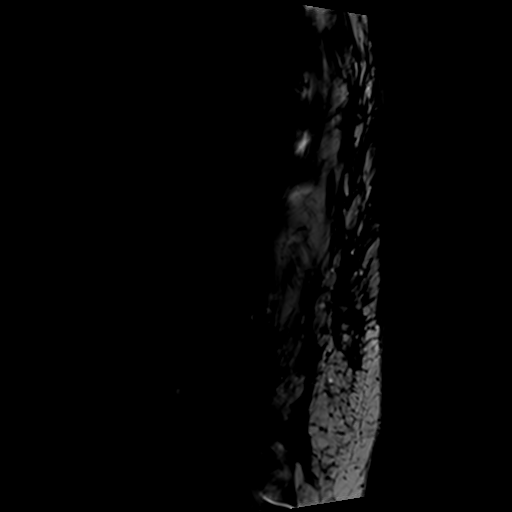
[im 15/15]
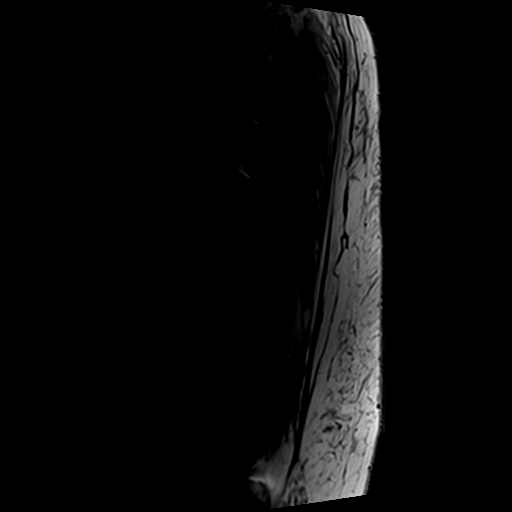

[Series 9: T2 · axial · 4.0mm · 0.70mm/px · z∈[-48,+144]mm · 8 of 36 slices shown (2 of 2)]
[im 1/36]
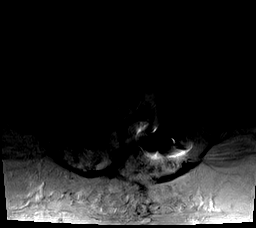
[im 6/36]
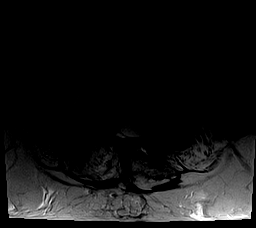
[im 11/36]
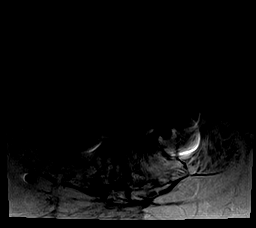
[im 16/36]
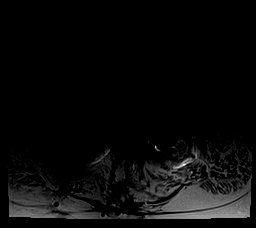
[im 21/36]
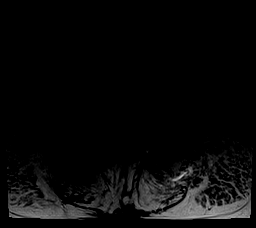
[im 26/36]
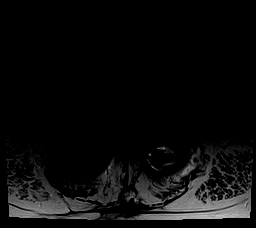
[im 31/36]
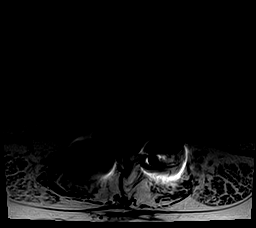
[im 36/36]
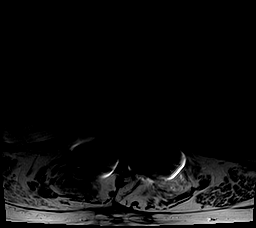

[Series 10: T1 · axial · 4.0mm · 0.35mm/px · z∈[-48,+144]mm · 8 of 36 slices shown (2 of 2)]
[im 1/36]
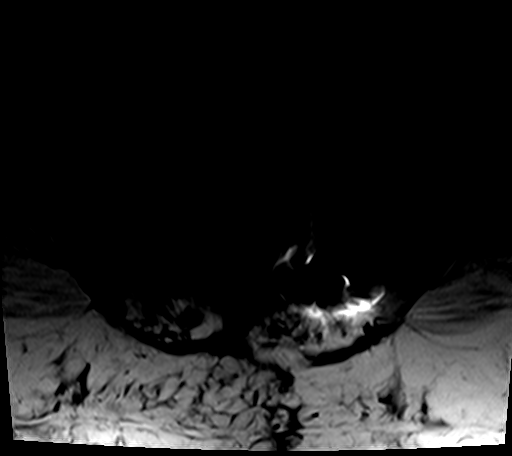
[im 6/36]
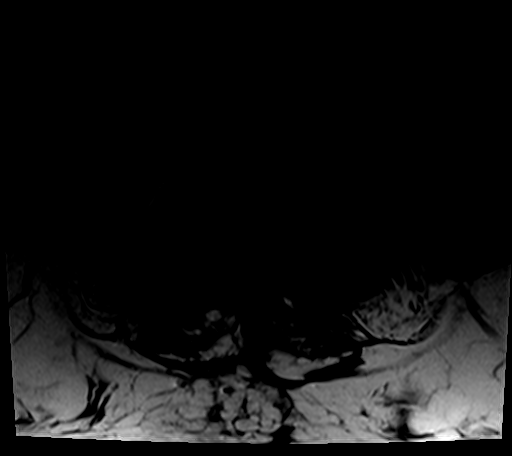
[im 11/36]
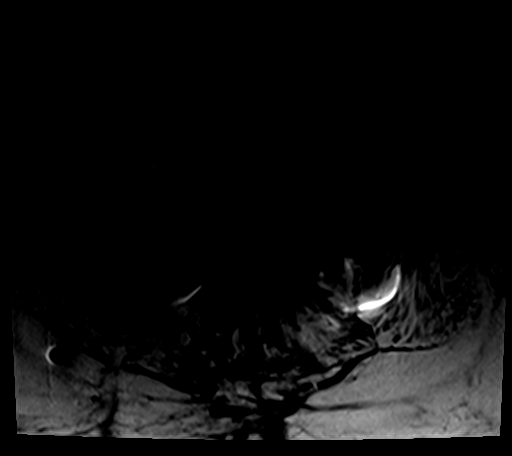
[im 16/36]
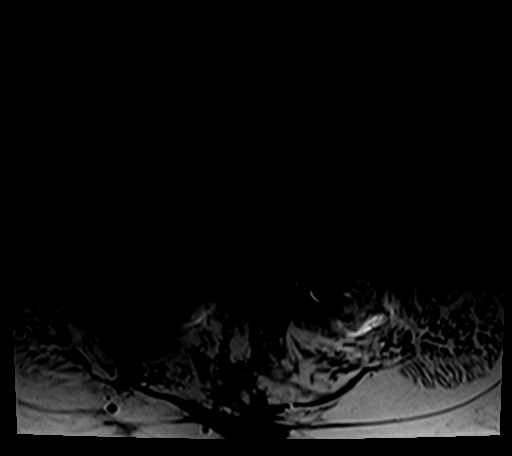
[im 21/36]
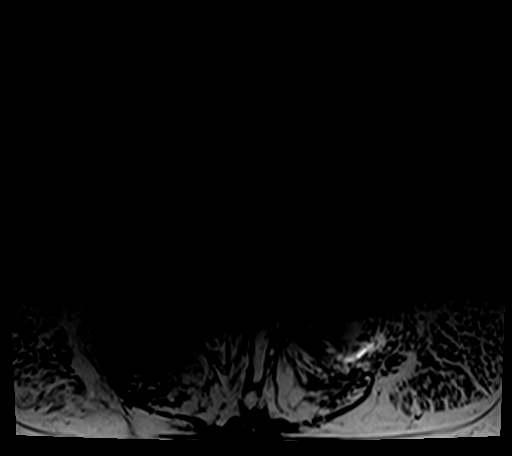
[im 26/36]
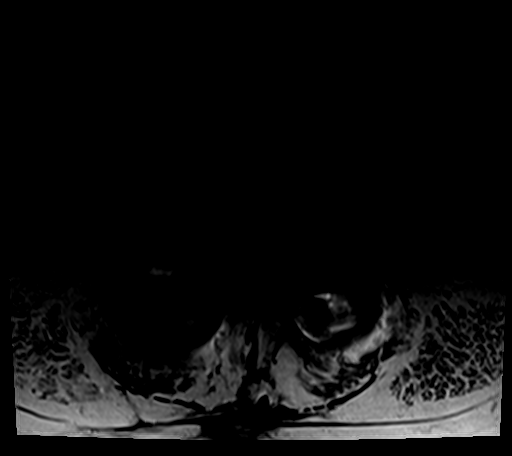
[im 31/36]
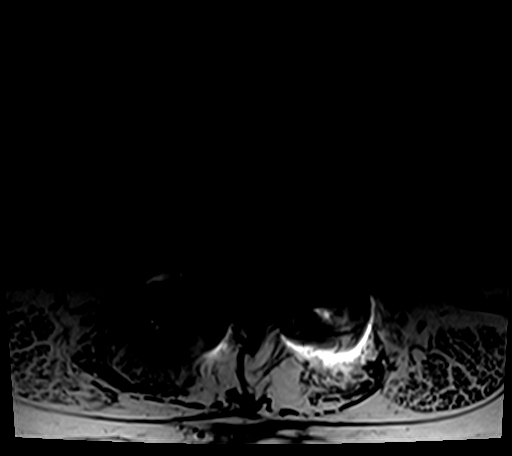
[im 36/36]
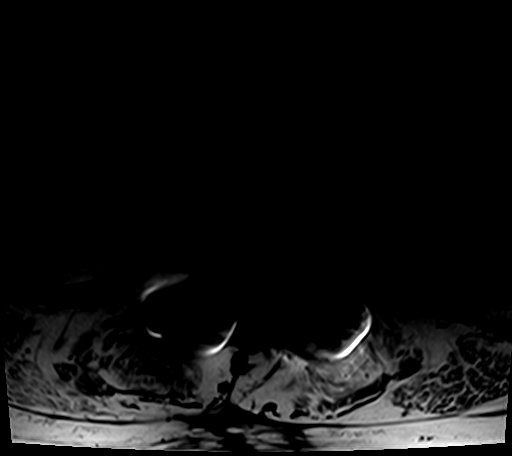

[24 of 48 positions shown; findings below may reference images not displayed]

FINDINGS: MRI THORACIC SPINE FINDINGS

Alignment:  Normal

Vertebrae: Grossly normal marrow signal. No bone lesions or
fractures. Moderate artifact associated with the fusion hardware
beginning at T8. No obvious complicating features are identified.

Cord:  Grossly normal cord signal intensity.  No cord lesions.

Paraspinal and other soft tissues: No significant paraspinal
process. The aorta is normal in caliber. No posterior mediastinal
mass or adenopathy.

Disc levels:

Degenerative disc disease at T1-2 with a small right-sided disc
osteophyte complex with mild impression on the right side of the
thecal sac.

No large thoracic disc protrusions, significant spinal or foraminal
stenosis.

MRI LUMBAR SPINE FINDINGS

Segmentation: There are five lumbar type vertebral bodies. The last
full intervertebral disc space is labeled L5-S1.

Alignment:  Normal overall alignment.

Vertebrae: Significant artifact from the hardware extending from
T8-S1. But no obvious bone lesions or fractures. I do not see any
findings suspicious for discitis or osteomyelitis.

Conus medullaris: Extends to the T12-L1 level and appears normal.

Paraspinal and other soft tissues: No obvious paraspinal or
retroperitoneal process. Marked fatty atrophy of the paraspinal
musculature.

Disc levels:

Grossly the fusion hardware appears intact. I do not see any
findings suspicious for discitis or osteomyelitis. There is a small
postoperative fluid collection at L5-S1. There are wide
decompressive laminectomies at all levels. No gross canal stenosis.
Osteophytic ridging at L1-2 and L2-3 but I do not see any gross
canal encroachment. There is a bulging degenerated annulus and mild
osteophytic ridging at L5-S1 but no significant spinal stenosis.
IMPRESSION: 1. Grossly intact fusion hardware extending from T8-S1. No obvious
complicating features are identified.
2. Degenerative disc disease at T1-2 with a small right-sided disc
osteophyte complex with mild impression on the right side of the
thecal sac.
3. No large thoracic disc protrusions, significant spinal or
foraminal stenosis.
4. Bulging degenerated annulus at L5-S1 but no significant stenosis.
5. If symptoms persist or worsen CT myelography may be helpful for
further evaluation.

## 2022-02-22 ENCOUNTER — Other Ambulatory Visit: Payer: Self-pay | Admitting: Physical Medicine and Rehabilitation

## 2022-02-22 ENCOUNTER — Encounter: Payer: Self-pay | Admitting: Physical Medicine and Rehabilitation

## 2022-02-22 ENCOUNTER — Encounter: Payer: MEDICARE | Attending: Physical Medicine and Rehabilitation | Admitting: Physical Medicine and Rehabilitation

## 2022-02-22 VITALS — BP 120/80 | HR 87 | Ht 68.0 in | Wt 234.8 lb

## 2022-02-22 DIAGNOSIS — R269 Unspecified abnormalities of gait and mobility: Secondary | ICD-10-CM

## 2022-02-22 DIAGNOSIS — M1711 Unilateral primary osteoarthritis, right knee: Secondary | ICD-10-CM

## 2022-02-22 DIAGNOSIS — M6281 Muscle weakness (generalized): Secondary | ICD-10-CM | POA: Diagnosis present

## 2022-02-22 DIAGNOSIS — R252 Cramp and spasm: Secondary | ICD-10-CM

## 2022-02-22 DIAGNOSIS — G8222 Paraplegia, incomplete: Secondary | ICD-10-CM

## 2022-02-22 MED ORDER — BACLOFEN 20 MG PO TABS: 20.0000 mg | ORAL_TABLET | Freq: Four times a day (QID) | ORAL | 1 refills | Status: DC

## 2022-02-22 MED ORDER — GABAPENTIN 300 MG PO CAPS: 300.0000 mg | ORAL_CAPSULE | Freq: Three times a day (TID) | ORAL | 1 refills | Status: DC

## 2022-02-22 NOTE — Patient Instructions (Addendum)
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 He also has increased risk of DVT, nerve and post op pain, Neurogenic bowel and bladder- with urinary retention- restarted Flomax.   SCI support group- [id number] at [place] Thursday of the month- evry month from 6-7 pm- - it's the conference on first floor down past food area on Right.   2. Will have to get R knee replaced most likely- and will need some sort of rehab afterwards- whether not it's acute inpt rehab or nursing home.  [facility] referral- Dr Marcene Duos- [facility] in [place]- doesn't want [place] for anything but shots- Of note, Pt has incomplete paraplegia- walks with rolator- but per pt/[facility], has "bone on bone" in R knee- has failed steroid and gel injections- interested in R knee total replacement-   3. Con't Baclofen 20 mg in Am and afternoon and 40 mg nightly #360- 1 refill and gabapentin 300 mg 3x/day #270- 1 refill and Flomax is 1 pill daily/nightly- #90- 1 refill- sent all in.    4. Xray of R knee- [place]- walk in for [employer].   5. F/U in 3 months- double appt for SCI/paraplegia.

## 2022-02-22 NOTE — Progress Notes (Signed)
Subjective:    Patient ID: Randall Mccormick, male    DOB: May 25, 1956, 66 y.o.   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 He also has increased risk of DVT, nerve and post op pain, Neurogenic bowel and bladder- with urinary retention- restarted Flomax.  Here for f/u on SCI.   Needs R knee surgery- knee is bone on bone- injections don't work anymore since hurts so bad.   Almost fell going to bathroom- because door was so heavy.  R knee puts him off balance.    Everything else about the same.   Seeing a massage therapist that comes to the house- specialty of back injuries- Last did Tuesday- deep tissue- walking better since did it.    Cannot use cane in the house- uses the rolator all the time.  Continuing with the baclofen and gabapentin And Flomax.   Only walking around the house- R knee really bothering him- gives out on him   Pain Inventory Average Pain 5 Pain Right Now 5 My pain is sharp and stabbing  LOCATION OF PAIN  back, knee  BOWEL Number of stools per week: 3 Oral laxative use Yes  Type of laxative dulcolax Enema or suppository use No  History of colostomy No  Incontinent No   BLADDER Pads In and out cath, frequency . Able to self cath  . Bladder incontinence Yes  Frequent urination No  Leakage with coughing No  Difficulty starting stream No  Incomplete bladder emptying No    Mobility use a walker ability to climb steps?  no do you drive?  no  Function disabled: date disabled . I need assistance with the following:  dressing, bathing, and meal prep  Neuro/Psych bladder control problems bowel control problems trouble walking anxiety  Prior Studies Any changes since last visit?  no  Physicians involved in your care Any changes since last visit?  no   Family History  Problem Relation Age of Onset   Cancer  Mother        BREAST   Heart disease Father    Alcoholism Father    Cancer Son        LEUKEMIA   Social History   Socioeconomic History   Marital status: Married    Spouse name: Not on file   Number of children: Not on file   Years of education: Not on file   Highest education level: Not on file  Occupational History   Not on file  Tobacco Use   Smoking status: Never   Smokeless tobacco: Current    Types: Chew  Vaping Use   Vaping Use: Never used  Substance and Sexual Activity   Alcohol use: Yes    Comment: 6-8 BEERS A NIGHT   Drug use: Not Currently   Sexual activity: Not on file  Other Topics Concern   Not on file  Social History Narrative   Not on file   Social Determinants of Health   Financial Resource Strain: Not on file  Food Insecurity: Not on file  Transportation Needs: Not on file  Physical Activity: Not on file  Stress: Not on file  Social Connections: Not on file   Past Surgical History:  Procedure Laterality Date   APPENDECTOMY     CHOLECYSTECTOMY  2010   [facility].   TONSILLECTOMY     Past Medical History:  Diagnosis Date   Anxiety    Cobalamin deficiency    Degeneration of lumbar or lumbosacral intervertebral disc    Depression    Gouty arthropathy, unspecified    Hemorrhoids    Hyperlipidemia    Hypertension    Multiple fractures of ribs of left side 04/20/13   FALL FROM A LADDER ON    Vitamin D deficiency    BP 120/80   Pulse 87   Ht '5\' 8"'$  (1.727 m)   Wt 234 lb 12.8 oz (106.5 kg)   SpO2 94%   BMI 35.70 kg/m   Opioid Risk Score:   Fall Risk Score:  `1  Depression screen PHQ 2/9     06/29/2021    1:10 PM 03/28/2021    9:54 AM 11/01/2020    8:19 AM 09/08/2020   12:56 PM  Depression screen PHQ 2/9  Decreased Interest 0 0 0 0  Down, Depressed, Hopeless 0 0 0 0  PHQ - 2 Score 0 0 0 0  Altered sleeping    0  Tired, decreased energy    0  Change in appetite    0  Feeling bad or failure about yourself     0  Trouble  concentrating    0  Moving slowly or fidgety/restless    0  Suicidal thoughts    0  PHQ-9 Score    0  Difficult doing work/chores    Not difficult at all     Review of Systems  Gastrointestinal:  Positive for constipation.  Musculoskeletal:  Positive for gait problem.  Psychiatric/Behavioral:  The patient is nervous/anxious.   All other systems reviewed and are negative.     Objective:   Physical Exam Awake, alert, appropriate, brighter affect; accompanied by wife; using rolator; NAD R knee brace= hinged- in place  MS:  RLE- HF 4/5; KE 4+/5; DF and PF 4+/5 LLE- HF- 4+/5; KE 4 to 4+/5; DF and PF 4+/5 RLE limited by pain at knee   Neuro:  No clonus B/L Improved Spasticity in LE-s LLE tighter than RLE- RLE MAS of 1- MAS of LLE 1+ to 2      Assessment & Plan:   Pt is a 66 yr old male with  Hx of incomplete paraplegia/thoracic myelopathy- now  T11 ASIA D incomplete paraplegia- - s/p decompression and fusion. He also has increased risk of DVT, nerve and post op pain, Neurogenic bowel and bladder- with urinary retention- restarted Flomax.   SCI support group- [id number] at [place] Thursday of the month- evry month from 6-7 pm-   2. Will have to get R knee replaced most likely- and will need some sort of rehab afterwards- whether not it's acute inpt rehab or nursing home.  [facility] referral- Dr Marcene Duos- [facility] in [place]- doesn't want [place] for anything but shots- Of note, Pt has incomplete paraplegia- walks with rolator- but per pt/[facility], has "bone on bone" in R knee- has failed steroid and gel injections- interested in R knee total replacement-   3. Con't Baclofen 20 mg in Am and afternoon and 40 mg nightly #360- 1 refill and gabapentin 300 mg 3x/day #270- 1 refill and Flomax is 1 pill daily/nightly- #90- 1 refill- sent all in.    4. Xray of R knee- [place]- walk in for [employer].   5. F/U in 3  months- double appt for SCI/paraplegia.   I spent a total of 22  minutes on total care today- >50% coordination of care- due to discussing of Xray-  of R knee- location, etc, about SCI reason for his weakness and SCI support group.

## 2022-03-11 ENCOUNTER — Ambulatory Visit (INDEPENDENT_AMBULATORY_CARE_PROVIDER_SITE_OTHER): Payer: MEDICARE | Admitting: Orthopedic Surgery

## 2022-03-11 ENCOUNTER — Ambulatory Visit (INDEPENDENT_AMBULATORY_CARE_PROVIDER_SITE_OTHER): Payer: MEDICARE

## 2022-03-11 VITALS — Ht 68.0 in | Wt 234.0 lb

## 2022-03-11 DIAGNOSIS — M25562 Pain in left knee: Secondary | ICD-10-CM | POA: Diagnosis not present

## 2022-03-12 ENCOUNTER — Encounter: Payer: Self-pay | Admitting: Orthopedic Surgery

## 2022-03-12 NOTE — Progress Notes (Signed)
Office Visit Note   Patient: Randall Mccormick           Date of Birth: [birth year]           MRN: [id number] Visit Date: 03/11/2022 Requested by: Courtney Heys, [address] [place],  [place] [address] PCP: [name], MD  Subjective: Chief Complaint  Patient presents with   Left Knee - Pain    HPI: Gaege is a 66 year old patient with right knee arthritis.  Reports chronic pain for many years.  Denies any history of injury.  He ambulates with a walker.  The pain comes and goes.  The pain does not wake him from sleep at night.  He reports locking weakness and giving way.  He has had multiple back surgeries which is the primary reason he uses a walker.  Pain does not wake him from sleep at night.  He has had prior injections which have not given him any relief.  He does use a knee brace.  Denies any personal or family history of DVT or pulmonary embolism.  He does have a Thanksgiving family event going on in late November that he would like to be ready for in terms of recovery.              ROS: All systems reviewed are negative as they relate to the chief complaint within the history of present illness.  Patient denies  fevers or chills.   Assessment & Plan: Visit Diagnoses:  1. Left knee pain, unspecified chronicity     Plan: Impression is end-stage right knee arthritis.  Plan is right total knee replacement.  The risk and benefits are discussed with the patient including not limited to infection nerve vessel damage knee stiffness incomplete pain relief as well as the possibility of stroke heart attack blood clot and other medical events due to the stress of surgery.  The extensive nature of the rehabilitative process is also discussed.  Anticipate the patient will go to skilled nursing.  His wife has had both her knees replaced and so he does have some sense of the recovery process involved although her recovery process was very smooth  compared to most.  Follow-Up  Instructions: No follow-ups on file.   Orders:  Orders Placed This Encounter  Procedures   XR Knee 1-2 Views Right   No orders of the defined types were placed in this encounter.     Procedures: No procedures performed   Clinical Data: No additional findings.  Objective: Vital Signs: Ht '5\' 8"'$  (1.727 m)   Wt 234 lb (106.1 kg)   BMI 35.58 kg/m   Physical Exam:   Constitutional: Patient appears well-developed HEENT:  Head: Normocephalic Eyes:EOM are normal Neck: Normal range of motion Cardiovascular: Normal rate Pulmonary/chest: Effort normal Neurologic: Patient is alert Skin: Skin is warm Psychiatric: Patient has normal mood and affect   Ortho Exam: Ortho exam demonstrates range of motion of the right knee 5-1 10.  Collaterals are stable to varus and valgus stress.  Does have slightly increased anterior posterior instability compared to the left knee.  No groin pain on the right with internal/external rotation of the leg.  Pedal pulses palpable.  No other masses lymphadenopathy or skin changes noted in that right knee region.  Pedal pulses palpable.  Ankle dorsiflexion intact.  Specialty Comments:  No specialty comments available.  Imaging: No results found.   PMFS History: Patient Active Problem List   Diagnosis Date Noted  Abnormal gait due to muscle weakness 03/28/2021   Urinary retention 03/28/2021   Incomplete paraplegia ([place]) 09/08/2020   Spasticity 09/08/2020   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/08/2020   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/2020   Hypoalbuminemia due to protein-calorie malnutrition [facility])    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/2020   Constipation    Neurogenic bowel    Neurogenic bladder    Neuropathic pain    Chronic pain syndrome    Anxiety    Cobalamin deficiency    Degeneration of lumbar or lumbosacral intervertebral disc    Hemorrhoids    Hyperlipidemia    Hypertension    Depression    Gouty arthropathy, unspecified     Vitamin D deficiency    Multiple fractures of ribs of left side 04/20/2013   Past Medical History:  Diagnosis Date   Anxiety    Cobalamin deficiency    Degeneration of lumbar or lumbosacral intervertebral disc    Depression    Gouty arthropathy, unspecified    Hemorrhoids    Hyperlipidemia    Hypertension    Multiple fractures of ribs of left side 04/20/13   FALL FROM A LADDER ON    Vitamin D deficiency     Family History  Problem Relation Age of Onset   Cancer Mother        BREAST   Heart disease Father    Alcoholism Father    Cancer Son        LEUKEMIA    Past Surgical History:  Procedure Laterality Date   APPENDECTOMY     CHOLECYSTECTOMY  2010   [facility]   TONSILLECTOMY     Social History   Occupational History   Not on file  Tobacco Use   Smoking status: Never   Smokeless tobacco: Current    Types: Chew  Vaping Use   Vaping Use: Never used  Substance and Sexual Activity   Alcohol use: Yes    Comment: 6-8 BEERS A NIGHT   Drug use: Not Currently   Sexual activity: Not on file

## 2022-03-18 ENCOUNTER — Other Ambulatory Visit: Payer: Self-pay

## 2022-03-20 NOTE — Pre-Procedure Instructions (Signed)
Surgical Instructions    Your procedure is scheduled on March 28, 2022.  Report to [facility] Main Entrance "A" at 5:30 A.M., then check in with the Admitting office.  Call this number if you have problems the morning of surgery:  [phone]   If you have any questions prior to your surgery date call [phone]: Open Monday-Friday 8am-4pm    Remember:  Do not eat after midnight the night before your surgery  You may drink clear liquids until 4:30 AM the morning of your surgery.   Clear liquids allowed are: Water, Non-Citrus Juices (without pulp), Carbonated Beverages, Clear Tea, Black Coffee Only (NO MILK, CREAM OR POWDERED CREAMER of any kind), and Gatorade.     Take these medicines the morning of surgery with A SIP OF WATER:  allopurinol (ZYLOPRIM)  baclofen (LIORESAL)   gabapentin (NEURONTIN)  oxymetazoline (AFRIN)   Take these medicines the morning of surgery with a sip of water AS NEEDED:  acetaminophen (TYLENOL)  ALPRAZolam (XANAX)  oxyCODONE    As of today, STOP taking any Aspirin (unless otherwise instructed by your surgeon) Aleve, Naproxen, Ibuprofen, Motrin, Advil, Goody's, BC's, all herbal medications, fish oil, and all vitamins. This includes your medication: meloxicam (MOBIC)                     Do NOT Smoke (Tobacco/Vaping) for 24 hours prior to your procedure.  If you use a CPAP at night, you may bring your mask/headgear for your overnight stay.   Contacts, glasses, piercing's, hearing aid's, dentures or partials may not be worn into surgery, please bring cases for these belongings.    For patients admitted to the hospital, discharge time will be determined by your treatment team.   Patients discharged the day of surgery will not be allowed to drive home, and someone needs to stay with them for 24 hours.  SURGICAL WAITING ROOM VISITATION Patients having surgery or a procedure may have no more than 2 support people in the waiting area - these visitors  may rotate.   Children under the age of 22 must have an adult with them who is not the patient. If the patient needs to stay at the hospital during part of their recovery, the visitor guidelines for inpatient rooms apply. Pre-op nurse will coordinate an appropriate time for 1 support person to accompany patient in pre-op.  This support person may not rotate.   Please refer to the [facility] website for the visitor guidelines for Inpatients (after your surgery is over and you are in a regular room).    Special instructions:   [place]- Preparing For Surgery  Before surgery, you can play an important role. Because skin is not sterile, your skin needs to be as free of germs as possible. You can reduce the number of germs on your skin by washing with CHG (chlorahexidine gluconate) Soap before surgery.  CHG is an antiseptic cleaner which kills germs and bonds with the skin to continue killing germs even after washing.    Oral Hygiene is also important to reduce your risk of infection.  Remember - BRUSH YOUR TEETH THE MORNING OF SURGERY WITH YOUR REGULAR TOOTHPASTE  Please do not use if you have an allergy to CHG or antibacterial soaps. If your skin becomes reddened/irritated stop using the CHG.  Do not shave (including legs and underarms) for at least 48 hours prior to first CHG shower. It is OK to shave your face.  Please follow these instructions  carefully.   Shower the NIGHT BEFORE SURGERY and the MORNING OF SURGERY  If you chose to wash your hair, wash your hair first as usual with your normal shampoo.  After you shampoo, rinse your hair and body thoroughly to remove the shampoo.  Use CHG Soap as you would any other liquid soap. You can apply CHG directly to the skin and wash gently with a scrungie or a clean washcloth.   Apply the CHG Soap to your body ONLY FROM THE NECK DOWN.  Do not use on open wounds or open sores. Avoid contact with your eyes, ears, mouth and genitals (private  parts). Wash Face and genitals (private parts)  with your normal soap.   Wash thoroughly, paying special attention to the area where your surgery will be performed.  Thoroughly rinse your body with warm water from the neck down.  DO NOT shower/wash with your normal soap after using and rinsing off the CHG Soap.  Pat yourself dry with a CLEAN TOWEL.  Wear CLEAN PAJAMAS to bed the night before surgery  Place CLEAN SHEETS on your bed the night before your surgery  DO NOT SLEEP WITH PETS.   Day of Surgery: Take a shower with CHG soap. Do not wear jewelry or makeup Do not wear lotions, powders, perfumes/colognes, or deodorant. Do not shave 48 hours prior to surgery.  Men may shave face and neck. Do not bring valuables to the hospital.  [facility] is not responsible for any belongings or valuables. Do not wear nail polish, gel polish, artificial nails, or any other type of covering on natural nails (fingers and toes) If you have artificial nails or gel coating that need to be removed by a nail salon, please have this removed prior to surgery. Artificial nails or gel coating may interfere with anesthesia's ability to adequately monitor your vital signs.  Wear Clean/Comfortable clothing the morning of surgery Remember to brush your teeth WITH YOUR REGULAR TOOTHPASTE.   Please read over the following fact sheets that you were given.    If you received a COVID test during your pre-op visit  it is requested that you wear a mask when out in public, stay away from anyone that may not be feeling well and notify your surgeon if you develop symptoms. If you have been in contact with anyone that has tested positive in the last 10 days please notify you surgeon.

## 2022-03-21 ENCOUNTER — Other Ambulatory Visit: Payer: Self-pay

## 2022-03-21 ENCOUNTER — Encounter (HOSPITAL_COMMUNITY): Payer: Self-pay | Admitting: Orthopedic Surgery

## 2022-03-21 ENCOUNTER — Encounter (HOSPITAL_COMMUNITY)
Admission: RE | Admit: 2022-03-21 | Discharge: 2022-03-21 | Disposition: A | Discharge status: Home / Self Care | Payer: MEDICARE | Source: Ambulatory Visit | Attending: Orthopedic Surgery | Admitting: Orthopedic Surgery

## 2022-03-21 DIAGNOSIS — Z01818 Encounter for other preprocedural examination: Secondary | ICD-10-CM | POA: Insufficient documentation

## 2022-03-21 LAB — CBC
HCT: 44.3 % (ref 39.0–52.0)
Hemoglobin: 14.8 g/dL (ref 13.0–17.0)
MCH: 32.8 pg (ref 26.0–34.0)
MCHC: 33.4 g/dL (ref 30.0–36.0)
MCV: 98.2 fL (ref 80.0–100.0)
Platelets: 238 10*3/uL (ref 150–400)
RBC: 4.51 MIL/uL (ref 4.22–5.81)
RDW: 14.4 % (ref 11.5–15.5)
WBC: 8.5 10*3/uL (ref 4.0–10.5)
nRBC: 0 % (ref 0.0–0.2)

## 2022-03-21 LAB — URINALYSIS, COMPLETE (UACMP) WITH MICROSCOPIC
Bacteria, UA: NONE SEEN
Bilirubin Urine: NEGATIVE
Glucose, UA: NEGATIVE mg/dL
Hgb urine dipstick: NEGATIVE
Ketones, ur: NEGATIVE mg/dL
Leukocytes,Ua: NEGATIVE
Nitrite: NEGATIVE
Protein, ur: NEGATIVE mg/dL
Specific Gravity, Urine: 1.004 — ABNORMAL LOW (ref 1.005–1.030)
pH: 7 (ref 5.0–8.0)

## 2022-03-21 LAB — BASIC METABOLIC PANEL
Anion gap: 8 (ref 5–15)
BUN: 9 mg/dL (ref 8–23)
CO2: 28 mmol/L (ref 22–32)
Calcium: 8.9 mg/dL (ref 8.9–10.3)
Chloride: 95 mmol/L — ABNORMAL LOW (ref 98–111)
Creatinine, Ser: 0.88 mg/dL (ref 0.61–1.24)
GFR, Estimated: >60 mL/min (ref >60)
Glucose, Bld: 99 mg/dL (ref 70–99)
Potassium: 4.4 mmol/L (ref 3.5–5.1)
Sodium: 131 mmol/L — ABNORMAL LOW (ref 135–145)

## 2022-03-21 LAB — SURGICAL PCR SCREEN
MRSA, PCR: NEGATIVE
Staphylococcus aureus: POSITIVE — AB

## 2022-03-21 NOTE — Progress Notes (Signed)
Surgical Instructions                 Your procedure is scheduled on March 28, 2022.             Report to [facility] Main Entrance "A" at 5:30 A.M., then check in with the Admitting office.             Call this number if you have problems the morning of surgery:             [phone]    If you have any questions prior to your surgery date call [phone]: Open Monday-Friday 8am-4pm                 Remember:             Do not eat after midnight the night before your surgery   You may drink clear liquids until 4:30 AM the morning of your surgery.   Clear liquids allowed are: Water, Non-Citrus Juices (without pulp), Carbonated Beverages, Clear Tea, Black Coffee Only (NO MILK, CREAM OR POWDERED CREAMER of any kind), and Gatorade.   Enhanced Recovery after Surgery for Orthopedics Enhanced Recovery after Surgery is a protocol used to improve the stress on your body and your recovery after surgery.  Patient Instructions  The day of surgery (if you do NOT have diabetes):  Drink ONE (1) Pre-Surgery Clear Ensure by _____ am the morning of surgery   This drink was given to you during your hospital  pre-op appointment visit. Nothing else to drink after completing the  Pre-Surgery Clear Ensure.          If you have questions, please contact your surgeon's office.                            Take these medicines the morning of surgery with A SIP OF WATER:             allopurinol (ZYLOPRIM)             baclofen (LIORESAL)              gabapentin (NEURONTIN)             oxymetazoline (AFRIN)               Take these medicines the morning of surgery with a sip of water AS NEEDED:             acetaminophen (TYLENOL)             ALPRAZolam (XANAX)             oxyCODONE      As of today, STOP taking any Aspirin (unless otherwise instructed by your surgeon) Aleve, Naproxen, Ibuprofen, Motrin, Advil, Goody's, BC's, all herbal medications, fish oil, and all vitamins. This includes your  medication: meloxicam (MOBIC)                     Do NOT Smoke (Tobacco/Vaping) for 24 hours prior to your procedure.   If you use a CPAP at night, you may bring your mask/headgear for your overnight stay.   Contacts, glasses, piercing's, hearing aid's, dentures or partials may not be worn into surgery, please bring cases for these belongings.    For patients admitted to the hospital, discharge time will be determined by your treatment team.   Patients discharged the day of surgery will not be allowed to drive  home, and someone needs to stay with them for 24 hours.   SURGICAL WAITING ROOM VISITATION Patients having surgery or a procedure may have no more than 2 support people in the waiting area - these visitors may rotate.   Children under the age of 2 must have an adult with them who is not the patient. If the patient needs to stay at the hospital during part of their recovery, the visitor guidelines for inpatient rooms apply. Pre-op nurse will coordinate an appropriate time for 1 support person to accompany patient in pre-op.  This support person may not rotate.    Please refer to the [facility] website for the visitor guidelines for Inpatients (after your surgery is over and you are in a regular room).      Special instructions:   [facility]- Preparing For Surgery   Before surgery, you can play an important role. Because skin is not sterile, your skin needs to be as free of germs as possible. You can reduce the number of germs on your skin by washing with CHG (chlorahexidine gluconate) Soap before surgery.  CHG is an antiseptic cleaner which kills germs and bonds with the skin to continue killing germs even after washing.     Oral Hygiene is also important to reduce your risk of infection.  Remember - BRUSH YOUR TEETH THE MORNING OF SURGERY WITH YOUR REGULAR TOOTHPASTE   Please do not use if you have an allergy to CHG or antibacterial soaps. If your skin becomes reddened/irritated  stop using the CHG.  Do not shave (including legs and underarms) for at least 48 hours prior to first CHG shower. It is OK to shave your face.   Please follow these instructions carefully.                                                                                                                               Shower the NIGHT BEFORE SURGERY and the MORNING OF SURGERY   If you chose to wash your hair, wash your hair first as usual with your normal shampoo.   After you shampoo, rinse your hair and body thoroughly to remove the shampoo.   Use CHG Soap as you would any other liquid soap. You can apply CHG directly to the skin and wash gently with a scrungie or a clean washcloth.    Apply the CHG Soap to your body ONLY FROM THE NECK DOWN.  Do not use on open wounds or open sores. Avoid contact with your eyes, ears, mouth and genitals (private parts). Wash Face and genitals (private parts)  with your normal soap.    Wash thoroughly, paying special attention to the area where your surgery will be performed.   Thoroughly rinse your body with warm water from the neck down.   DO NOT shower/wash with your normal soap after using and rinsing off the CHG Soap.   Pat yourself dry with a CLEAN TOWEL.  Wear CLEAN PAJAMAS to bed the night before surgery   Place CLEAN SHEETS on your bed the night before your surgery   DO NOT SLEEP WITH PETS.     Day of Surgery: Take a shower with CHG soap. Do not wear jewelry or makeup Do not wear lotions, powders, perfumes/colognes, or deodorant. Do not shave 48 hours prior to surgery.  Men may shave face and neck. Do not bring valuables to the hospital.  [facility] is not responsible for any belongings or valuables. Do not wear nail polish, gel polish, artificial nails, or any other type of covering on natural nails (fingers and toes) If you have artificial nails or gel coating that need to be removed by a nail salon, please have this removed prior to  surgery. Artificial nails or gel coating may interfere with anesthesia's ability to adequately monitor your vital signs.   Wear Clean/Comfortable clothing the morning of surgery Remember to brush your teeth WITH YOUR REGULAR TOOTHPASTE.   Please read over the following fact sheets that you were given.       If you received a COVID test during your pre-op visit  it is requested that you wear a mask when out in public, stay away from anyone that may not be feeling well and notify your surgeon if you develop symptoms. If you have been in contact with anyone that has tested positive in the last 10 days please notify you surgeon.

## 2022-03-21 NOTE — Progress Notes (Signed)
PCP - Dr. Nelda Bucks Cardiologist - Denies  PPM/ICD - Denies  Chest x-ray - NI EKG - 03/21/22 Stress Test - "It's been 10 years ago I guess, everything was fine." ECHO - Doesn't recall  Cardiac Cath - Denies  Sleep Study - Denies OSA  DM - Denies  Blood Thinner Instructions:Denies Aspirin Instructions:Denies  ERAS Protcol -yes PRE-SURGERY Ensure given   COVID TEST- NI   Anesthesia review No  Patient denies shortness of breath, fever, cough and chest pain at PAT appointment   All instructions explained to the patient, with a verbal understanding of the material. Patient agrees to go over the instructions while at home for a better understanding.  The opportunity to ask questions was provided.

## 2022-03-22 LAB — URINE CULTURE: Culture: NO GROWTH

## 2022-03-25 ENCOUNTER — Ambulatory Visit (INDEPENDENT_AMBULATORY_CARE_PROVIDER_SITE_OTHER): Payer: MEDICARE | Admitting: Podiatry

## 2022-03-25 DIAGNOSIS — B351 Tinea unguium: Secondary | ICD-10-CM | POA: Diagnosis not present

## 2022-03-25 DIAGNOSIS — M79675 Pain in left toe(s): Secondary | ICD-10-CM

## 2022-03-25 DIAGNOSIS — M79674 Pain in right toe(s): Secondary | ICD-10-CM | POA: Diagnosis not present

## 2022-03-25 DIAGNOSIS — G5793 Unspecified mononeuropathy of bilateral lower limbs: Secondary | ICD-10-CM

## 2022-03-25 DIAGNOSIS — M2041 Other hammer toe(s) (acquired), right foot: Secondary | ICD-10-CM | POA: Diagnosis not present

## 2022-03-25 DIAGNOSIS — M2042 Other hammer toe(s) (acquired), left foot: Secondary | ICD-10-CM

## 2022-03-25 NOTE — Progress Notes (Signed)
  Subjective:  Patient ID: Randall Mccormick, male    DOB: 06-Jan-1956,  MRN: [id number]  Chief Complaint  Patient presents with   Nail Problem    RFC - NAIL TRIM     66 y.o. male presents with the above complaint. History confirmed with patient.  Patient presents with painful thickened elongated nails x5 on both feet.   Has pain with pressure on the nails.  He states he is having upcoming right knee surgery and wanted to get this nail pain problem addressed prior to getting his knee replaced.  He and his wife are unable to trim his nails due to their thickness and mobility issues.  He does have a history of spinal injury and multiple spine surgeries which have resulted in some neurological deficits and neuropathy in both feet.   Objective:  Physical Exam: warm, good capillary refill, nail exam onychomycosis of the toenails with thickening with pain and elongation of the lesser toenails on both feet, no trophic changes or ulcerative lesions. DP pulses palpable, PT pulses palpable, and protective sensation absent Left Foot: Hammertoe of the lesser digits left foot 2 through 5 Right Foot: Hammertoe of the lesser digits right foot 2 through 5  No images are attached to the encounter.  Assessment:   1. Pain due to onychomycosis of toenails of both feet   2. Neuropathy involving both lower extremities   3. Hammer toes of both feet      Plan:  Patient was evaluated and treated and all questions answered.  Onychomycosis with pain  -Nails palliatively debrided as below. -Educated on self-care  Procedure: Nail Debridement Rationale: Pain Type of Debridement: manual, sharp debridement. Instrumentation: Nail nipper, rotary burr. Number of Nails: 10  Return if symptoms worsen or fail to improve.         Randall Mccormick, DPM [facility]

## 2022-03-27 MED ORDER — TRANEXAMIC ACID 1000 MG/10ML IV SOLN
2000.0000 mg | INTRAVENOUS | Status: DC
  Filled 2022-03-27: qty 20

## 2022-03-27 NOTE — Anesthesia Preprocedure Evaluation (Signed)
Anesthesia Evaluation  Patient identified by MRN, date of birth, ID band Patient awake    Reviewed: Allergy & Precautions, NPO status , Patient's Chart, lab work & pertinent test results  History of Anesthesia Complications Negative for: history of anesthetic complications  Airway Mallampati: III  TM Distance: >3 FB Neck ROM: Full    Dental  (+) Dental Advisory Given   Pulmonary neg pulmonary ROS,    Pulmonary exam normal        Cardiovascular hypertension, Pt. on medications Normal cardiovascular exam     Neuro/Psych PSYCHIATRIC DISORDERS Anxiety Depression  Incomplete paraplegia Multiple back surgeries Uses walker for ambulation     GI/Hepatic negative GI ROS,   Endo/Other   Na 131   Renal/GU negative Renal ROS Bladder dysfunction      Musculoskeletal  (+) Arthritis , narcotic dependent  Abdominal   Peds  Hematology negative hematology ROS (+)   Anesthesia Other Findings Chronic pain syndrome   Reproductive/Obstetrics                            Anesthesia Physical Anesthesia Plan  ASA: 3  Anesthesia Plan: General   Post-op Pain Management: Regional block* and Tylenol PO (pre-op)*   Induction: Intravenous  PONV Risk Score and Plan: 2 and Treatment may vary due to age or medical condition, Ondansetron, Dexamethasone and Midazolam  Airway Management Planned: Oral ETT  Additional Equipment: None  Intra-op Plan:   Post-operative Plan: Extubation in OR  Informed Consent: I have reviewed the patients History and Physical, chart, labs and discussed the procedure including the risks, benefits and alternatives for the proposed anesthesia with the patient or authorized representative who has indicated his/her understanding and acceptance.     Dental advisory given  Plan Discussed with: CRNA and Anesthesiologist  Anesthesia Plan Comments:       Anesthesia Quick  Evaluation

## 2022-03-28 ENCOUNTER — Inpatient Hospital Stay (HOSPITAL_COMMUNITY)
Admission: RE | Admit: 2022-03-28 | Discharge: 2022-04-02 | DRG: 855 | Disposition: A | Discharge status: Skilled Nursing Facility | Payer: MEDICARE | Attending: Orthopedic Surgery | Admitting: Orthopedic Surgery

## 2022-03-28 ENCOUNTER — Encounter (HOSPITAL_COMMUNITY): Payer: Self-pay | Admitting: Orthopedic Surgery

## 2022-03-28 ENCOUNTER — Encounter (HOSPITAL_COMMUNITY)
Admission: RE | Disposition: A | Discharge status: Skilled Nursing Facility | Payer: Self-pay | Source: Home / Self Care | Attending: Orthopedic Surgery

## 2022-03-28 ENCOUNTER — Other Ambulatory Visit: Payer: Self-pay

## 2022-03-28 ENCOUNTER — Ambulatory Visit (HOSPITAL_BASED_OUTPATIENT_CLINIC_OR_DEPARTMENT_OTHER): Payer: MEDICARE | Admitting: Anesthesiology

## 2022-03-28 ENCOUNTER — Ambulatory Visit (HOSPITAL_COMMUNITY): Payer: MEDICARE | Admitting: Anesthesiology

## 2022-03-28 DIAGNOSIS — Z8249 Family history of ischemic heart disease and other diseases of the circulatory system: Secondary | ICD-10-CM

## 2022-03-28 DIAGNOSIS — I10 Essential (primary) hypertension: Secondary | ICD-10-CM

## 2022-03-28 DIAGNOSIS — Z01818 Encounter for other preprocedural examination: Secondary | ICD-10-CM

## 2022-03-28 DIAGNOSIS — N319 Neuromuscular dysfunction of bladder, unspecified: Secondary | ICD-10-CM | POA: Diagnosis present

## 2022-03-28 DIAGNOSIS — Z79899 Other long term (current) drug therapy: Secondary | ICD-10-CM

## 2022-03-28 DIAGNOSIS — M1711 Unilateral primary osteoarthritis, right knee: Secondary | ICD-10-CM

## 2022-03-28 DIAGNOSIS — G894 Chronic pain syndrome: Secondary | ICD-10-CM | POA: Diagnosis present

## 2022-03-28 DIAGNOSIS — M109 Gout, unspecified: Secondary | ICD-10-CM | POA: Diagnosis present

## 2022-03-28 DIAGNOSIS — F418 Other specified anxiety disorders: Secondary | ICD-10-CM | POA: Diagnosis not present

## 2022-03-28 DIAGNOSIS — F419 Anxiety disorder, unspecified: Secondary | ICD-10-CM | POA: Diagnosis present

## 2022-03-28 DIAGNOSIS — F32A Depression, unspecified: Secondary | ICD-10-CM | POA: Diagnosis present

## 2022-03-28 DIAGNOSIS — Z87891 Personal history of nicotine dependence: Secondary | ICD-10-CM

## 2022-03-28 DIAGNOSIS — M1710 Unilateral primary osteoarthritis, unspecified knee: Secondary | ICD-10-CM | POA: Diagnosis present

## 2022-03-28 DIAGNOSIS — Z806 Family history of leukemia: Secondary | ICD-10-CM

## 2022-03-28 DIAGNOSIS — A6923 Arthritis due to Lyme disease: Principal | ICD-10-CM | POA: Diagnosis present

## 2022-03-28 DIAGNOSIS — M179 Osteoarthritis of knee, unspecified: Secondary | ICD-10-CM | POA: Diagnosis present

## 2022-03-28 DIAGNOSIS — E785 Hyperlipidemia, unspecified: Secondary | ICD-10-CM | POA: Diagnosis present

## 2022-03-28 HISTORY — PX: TOTAL KNEE ARTHROPLASTY: SHX125

## 2022-03-28 HISTORY — DX: Unilateral primary osteoarthritis, unspecified knee: M17.10

## 2022-03-28 HISTORY — DX: Osteoarthritis of knee, unspecified: M17.9

## 2022-03-28 SURGERY — TOTAL KNEE ARTHROPLASTY
Anesthesia: General | Site: Knee | Laterality: Right

## 2022-03-28 MED ORDER — ONDANSETRON HCL 4 MG/2ML IJ SOLN
4.0000 mg | Freq: Once | INTRAMUSCULAR | Status: AC | PRN
  Administered 2022-03-28: 4 mg via INTRAVENOUS

## 2022-03-28 MED ORDER — PHENYLEPHRINE 80 MCG/ML (10ML) SYRINGE FOR IV PUSH (FOR BLOOD PRESSURE SUPPORT)
PREFILLED_SYRINGE | INTRAVENOUS | Status: DC | PRN
  Administered 2022-03-28 (×3): 80 ug via INTRAVENOUS
  Administered 2022-03-28: 160 ug via INTRAVENOUS

## 2022-03-28 MED ORDER — MIDAZOLAM HCL 2 MG/2ML IJ SOLN
INTRAMUSCULAR | Status: AC
  Filled 2022-03-28: qty 2

## 2022-03-28 MED ORDER — VANCOMYCIN HCL 1000 MG IV SOLR
INTRAVENOUS | Status: AC
  Filled 2022-03-28: qty 20

## 2022-03-28 MED ORDER — SUCCINYLCHOLINE CHLORIDE 200 MG/10ML IV SOSY
PREFILLED_SYRINGE | INTRAVENOUS | Status: DC | PRN
  Administered 2022-03-28: 120 mg via INTRAVENOUS

## 2022-03-28 MED ORDER — MIDAZOLAM HCL 5 MG/5ML IJ SOLN
INTRAMUSCULAR | Status: DC | PRN
  Administered 2022-03-28: 1 mg via INTRAVENOUS

## 2022-03-28 MED ORDER — POVIDONE-IODINE 7.5 % EX SOLN
Freq: Once | CUTANEOUS | Status: DC
  Filled 2022-03-28: qty 118

## 2022-03-28 MED ORDER — SODIUM CHLORIDE 0.9 % IR SOLN
Status: DC | PRN
  Administered 2022-03-28: 3000 mL

## 2022-03-28 MED ORDER — MORPHINE SULFATE (PF) 4 MG/ML IV SOLN
INTRAVENOUS | Status: AC
  Filled 2022-03-28: qty 1

## 2022-03-28 MED ORDER — LACTATED RINGERS IV SOLN: INTRAVENOUS | Status: DC | PRN

## 2022-03-28 MED ORDER — PROPOFOL 10 MG/ML IV BOLUS
INTRAVENOUS | Status: AC
  Filled 2022-03-28: qty 20

## 2022-03-28 MED ORDER — EZETIMIBE 10 MG PO TABS
10.0000 mg | ORAL_TABLET | Freq: Every day | ORAL | Status: DC
  Administered 2022-03-28 – 2022-04-01 (×5): 10 mg via ORAL
  Filled 2022-03-28 (×5): qty 1

## 2022-03-28 MED ORDER — MAGNESIUM OXIDE -MG SUPPLEMENT 400 (240 MG) MG PO TABS
400.0000 mg | ORAL_TABLET | Freq: Every day | ORAL | Status: DC
  Administered 2022-03-28 – 2022-04-02 (×6): 400 mg via ORAL
  Filled 2022-03-28 (×6): qty 1

## 2022-03-28 MED ORDER — PHENOL 1.4 % MT LIQD: 1.0000 | OROMUCOSAL | Status: DC | PRN

## 2022-03-28 MED ORDER — VANCOMYCIN HCL 1000 MG IV SOLR
INTRAVENOUS | Status: DC | PRN
  Administered 2022-03-28: 1000 mg

## 2022-03-28 MED ORDER — FENTANYL CITRATE (PF) 250 MCG/5ML IJ SOLN
INTRAMUSCULAR | Status: AC
  Filled 2022-03-28: qty 5

## 2022-03-28 MED ORDER — LIDOCAINE 2% (20 MG/ML) 5 ML SYRINGE
INTRAMUSCULAR | Status: DC | PRN
  Administered 2022-03-28: 60 mg via INTRAVENOUS

## 2022-03-28 MED ORDER — SODIUM CHLORIDE (PF) 0.9 % IJ SOLN
INTRAMUSCULAR | Status: DC | PRN
  Administered 2022-03-28: 60 mL

## 2022-03-28 MED ORDER — ALLOPURINOL 300 MG PO TABS
300.0000 mg | ORAL_TABLET | Freq: Every day | ORAL | Status: DC
  Administered 2022-03-29 – 2022-04-02 (×5): 300 mg via ORAL
  Filled 2022-03-28 (×5): qty 1

## 2022-03-28 MED ORDER — PROPOFOL 10 MG/ML IV BOLUS
INTRAVENOUS | Status: DC | PRN
  Administered 2022-03-28: 150 mg via INTRAVENOUS
  Administered 2022-03-28: 50 mg via INTRAVENOUS

## 2022-03-28 MED ORDER — LISINOPRIL 20 MG PO TABS
40.0000 mg | ORAL_TABLET | Freq: Every day | ORAL | Status: DC
  Administered 2022-03-29 – 2022-04-02 (×5): 40 mg via ORAL
  Filled 2022-03-28 (×7): qty 2

## 2022-03-28 MED ORDER — HYDROMORPHONE HCL 1 MG/ML IJ SOLN
0.5000 mg | INTRAMUSCULAR | Status: DC | PRN
  Administered 2022-03-30: 1 mg via INTRAVENOUS
  Filled 2022-03-28: qty 1

## 2022-03-28 MED ORDER — SODIUM CHLORIDE 0.9 % IV SOLN: INTRAVENOUS | Status: AC

## 2022-03-28 MED ORDER — PHENYLEPHRINE HCL-NACL 20-0.9 MG/250ML-% IV SOLN
INTRAVENOUS | Status: DC | PRN
  Administered 2022-03-28: 50 ug/min via INTRAVENOUS

## 2022-03-28 MED ORDER — ASPIRIN 81 MG PO CHEW
81.0000 mg | CHEWABLE_TABLET | Freq: Two times a day (BID) | ORAL | Status: DC
  Administered 2022-03-28 – 2022-04-02 (×10): 81 mg via ORAL
  Filled 2022-03-28 (×10): qty 1

## 2022-03-28 MED ORDER — PHENYLEPHRINE-DM-GG-APAP 5-10-200-325 MG PO TABS: 2.0000 | ORAL_TABLET | Freq: Every day | ORAL | Status: DC | PRN

## 2022-03-28 MED ORDER — BUPIVACAINE LIPOSOME 1.3 % IJ SUSP
INTRAMUSCULAR | Status: AC
  Filled 2022-03-28: qty 20

## 2022-03-28 MED ORDER — ACETAMINOPHEN 500 MG PO TABS
1000.0000 mg | ORAL_TABLET | Freq: Once | ORAL | Status: AC
  Administered 2022-03-28: 1000 mg via ORAL
  Filled 2022-03-28: qty 2

## 2022-03-28 MED ORDER — FENTANYL CITRATE (PF) 100 MCG/2ML IJ SOLN
INTRAMUSCULAR | Status: AC
  Filled 2022-03-28: qty 2

## 2022-03-28 MED ORDER — MORPHINE SULFATE 4 MG/ML IJ SOLN
INTRAMUSCULAR | Status: DC | PRN
  Administered 2022-03-28: 13 mL

## 2022-03-28 MED ORDER — 0.9 % SODIUM CHLORIDE (POUR BTL) OPTIME
TOPICAL | Status: DC | PRN
  Administered 2022-03-28: 4000 mL

## 2022-03-28 MED ORDER — ALPRAZOLAM 0.5 MG PO TABS
0.5000 mg | ORAL_TABLET | Freq: Three times a day (TID) | ORAL | Status: DC | PRN
  Administered 2022-03-30: 0.5 mg via ORAL
  Filled 2022-03-28 (×2): qty 1

## 2022-03-28 MED ORDER — CEFAZOLIN SODIUM-DEXTROSE 2-4 GM/100ML-% IV SOLN
2.0000 g | Freq: Four times a day (QID) | INTRAVENOUS | Status: AC
  Administered 2022-03-28 (×2): 2 g via INTRAVENOUS
  Filled 2022-03-28 (×2): qty 100

## 2022-03-28 MED ORDER — ONDANSETRON HCL 4 MG PO TABS: 4.0000 mg | ORAL_TABLET | Freq: Four times a day (QID) | ORAL | Status: DC | PRN

## 2022-03-28 MED ORDER — OXYCODONE HCL 5 MG PO TABS: 5.0000 mg | ORAL_TABLET | Freq: Once | ORAL | Status: DC | PRN

## 2022-03-28 MED ORDER — TAMSULOSIN HCL 0.4 MG PO CAPS
0.4000 mg | ORAL_CAPSULE | Freq: Every day | ORAL | Status: DC
  Administered 2022-03-28 – 2022-04-02 (×5): 0.4 mg via ORAL
  Filled 2022-03-28 (×6): qty 1

## 2022-03-28 MED ORDER — ACETAMINOPHEN 500 MG PO TABS
1000.0000 mg | ORAL_TABLET | Freq: Four times a day (QID) | ORAL | Status: AC
  Administered 2022-03-28 – 2022-03-29 (×2): 1000 mg via ORAL
  Filled 2022-03-28 (×3): qty 2

## 2022-03-28 MED ORDER — BISACODYL 5 MG PO TBEC
10.0000 mg | DELAYED_RELEASE_TABLET | Freq: Every day | ORAL | Status: DC | PRN
  Administered 2022-04-01: 10 mg via ORAL
  Filled 2022-03-28: qty 2

## 2022-03-28 MED ORDER — LIDOCAINE 2% (20 MG/ML) 5 ML SYRINGE
INTRAMUSCULAR | Status: AC
  Filled 2022-03-28: qty 5

## 2022-03-28 MED ORDER — OLANZAPINE 10 MG PO TABS
20.0000 mg | ORAL_TABLET | Freq: Every evening | ORAL | Status: DC
  Administered 2022-03-28 – 2022-04-01 (×5): 20 mg via ORAL
  Filled 2022-03-28 (×5): qty 2

## 2022-03-28 MED ORDER — BACLOFEN 10 MG PO TABS
20.0000 mg | ORAL_TABLET | Freq: Two times a day (BID) | ORAL | Status: DC
  Administered 2022-03-28 – 2022-04-02 (×10): 20 mg via ORAL
  Filled 2022-03-28 (×10): qty 2

## 2022-03-28 MED ORDER — CHLORHEXIDINE GLUCONATE 0.12 % MT SOLN
OROMUCOSAL | Status: AC
Start: 2022-03-28 — End: 2022-03-28
  Administered 2022-03-28: 15 mL
  Filled 2022-03-28: qty 15

## 2022-03-28 MED ORDER — CLONIDINE HCL (ANALGESIA) 100 MCG/ML EP SOLN
EPIDURAL | Status: AC
  Filled 2022-03-28: qty 10

## 2022-03-28 MED ORDER — PHENYLEPHRINE 80 MCG/ML (10ML) SYRINGE FOR IV PUSH (FOR BLOOD PRESSURE SUPPORT)
PREFILLED_SYRINGE | INTRAVENOUS | Status: AC
  Filled 2022-03-28: qty 10

## 2022-03-28 MED ORDER — METOCLOPRAMIDE HCL 5 MG/ML IJ SOLN: 5.0000 mg | Freq: Three times a day (TID) | INTRAMUSCULAR | Status: DC | PRN

## 2022-03-28 MED ORDER — DEXAMETHASONE SODIUM PHOSPHATE 10 MG/ML IJ SOLN
INTRAMUSCULAR | Status: AC
  Filled 2022-03-28: qty 1

## 2022-03-28 MED ORDER — TRANEXAMIC ACID 1000 MG/10ML IV SOLN
INTRAVENOUS | Status: DC | PRN
  Administered 2022-03-28: 2000 mg via TOPICAL

## 2022-03-28 MED ORDER — ACETAMINOPHEN 325 MG PO TABS
325.0000 mg | ORAL_TABLET | Freq: Four times a day (QID) | ORAL | Status: DC | PRN
  Administered 2022-04-01: 650 mg via ORAL
  Filled 2022-03-28: qty 2

## 2022-03-28 MED ORDER — BUPIVACAINE HCL (PF) 0.25 % IJ SOLN
INTRAMUSCULAR | Status: AC
  Filled 2022-03-28: qty 30

## 2022-03-28 MED ORDER — GABAPENTIN 300 MG PO CAPS
600.0000 mg | ORAL_CAPSULE | Freq: Three times a day (TID) | ORAL | Status: DC
  Administered 2022-03-28 – 2022-04-02 (×15): 600 mg via ORAL
  Filled 2022-03-28 (×15): qty 2

## 2022-03-28 MED ORDER — ROCURONIUM BROMIDE 50 MG/5ML IV SOSY
PREFILLED_SYRINGE | INTRAVENOUS | Status: DC | PRN
  Administered 2022-03-28: 40 mg via INTRAVENOUS

## 2022-03-28 MED ORDER — POVIDONE-IODINE 10 % EX SWAB: 2.0000 | Freq: Once | CUTANEOUS | Status: DC

## 2022-03-28 MED ORDER — SUGAMMADEX SODIUM 200 MG/2ML IV SOLN
INTRAVENOUS | Status: DC | PRN
  Administered 2022-03-28: 200 mg via INTRAVENOUS

## 2022-03-28 MED ORDER — TRANEXAMIC ACID-NACL 1000-0.7 MG/100ML-% IV SOLN
1000.0000 mg | INTRAVENOUS | Status: DC
  Filled 2022-03-28: qty 100

## 2022-03-28 MED ORDER — EPHEDRINE 5 MG/ML INJ
INTRAVENOUS | Status: AC
  Filled 2022-03-28: qty 5

## 2022-03-28 MED ORDER — OXYCODONE HCL 5 MG PO TABS
10.0000 mg | ORAL_TABLET | ORAL | Status: DC | PRN
  Administered 2022-03-28 – 2022-03-29 (×2): 10 mg via ORAL
  Administered 2022-03-30 (×3): 15 mg via ORAL
  Filled 2022-03-28 (×2): qty 3
  Filled 2022-03-28 (×2): qty 2
  Filled 2022-03-28: qty 3

## 2022-03-28 MED ORDER — EPHEDRINE SULFATE-NACL 50-0.9 MG/10ML-% IV SOSY
PREFILLED_SYRINGE | INTRAVENOUS | Status: DC | PRN
  Administered 2022-03-28: 50 mg via INTRAVENOUS

## 2022-03-28 MED ORDER — IRRISEPT - 450ML BOTTLE WITH 0.05% CHG IN STERILE WATER, USP 99.95% OPTIME
TOPICAL | Status: DC | PRN
  Administered 2022-03-28: 450 mL

## 2022-03-28 MED ORDER — OXYMETAZOLINE HCL 0.05 % NA SOLN
1.0000 | Freq: Two times a day (BID) | NASAL | Status: AC
  Administered 2022-03-29 – 2022-03-30 (×3): 1 via NASAL
  Filled 2022-03-28: qty 30

## 2022-03-28 MED ORDER — FENTANYL CITRATE (PF) 250 MCG/5ML IJ SOLN
INTRAMUSCULAR | Status: DC | PRN
  Administered 2022-03-28: 50 ug via INTRAVENOUS
  Administered 2022-03-28: 150 ug via INTRAVENOUS
  Administered 2022-03-28: 50 ug via INTRAVENOUS

## 2022-03-28 MED ORDER — CEFAZOLIN SODIUM-DEXTROSE 2-4 GM/100ML-% IV SOLN
2.0000 g | INTRAVENOUS | Status: AC
  Administered 2022-03-28: 2 g via INTRAVENOUS
  Filled 2022-03-28: qty 100

## 2022-03-28 MED ORDER — OXYCODONE HCL 5 MG/5ML PO SOLN: 5.0000 mg | Freq: Once | ORAL | Status: DC | PRN

## 2022-03-28 MED ORDER — ONDANSETRON HCL 4 MG/2ML IJ SOLN
INTRAMUSCULAR | Status: AC
  Filled 2022-03-28: qty 2

## 2022-03-28 MED ORDER — FENTANYL CITRATE (PF) 100 MCG/2ML IJ SOLN
25.0000 ug | INTRAMUSCULAR | Status: DC | PRN
  Administered 2022-03-28 (×2): 25 ug via INTRAVENOUS

## 2022-03-28 MED ORDER — MENTHOL 3 MG MT LOZG
1.0000 | LOZENGE | OROMUCOSAL | Status: DC | PRN
  Administered 2022-04-01: 3 mg via ORAL
  Filled 2022-03-28: qty 9

## 2022-03-28 MED ORDER — ONDANSETRON HCL 4 MG/2ML IJ SOLN: 4.0000 mg | Freq: Four times a day (QID) | INTRAMUSCULAR | Status: DC | PRN

## 2022-03-28 MED ORDER — ROPIVACAINE HCL 7.5 MG/ML IJ SOLN
INTRAMUSCULAR | Status: DC | PRN
  Administered 2022-03-28: 20 mL via PERINEURAL

## 2022-03-28 MED ORDER — METOCLOPRAMIDE HCL 5 MG PO TABS: 5.0000 mg | ORAL_TABLET | Freq: Three times a day (TID) | ORAL | Status: DC | PRN

## 2022-03-28 MED ORDER — DEXAMETHASONE SODIUM PHOSPHATE 10 MG/ML IJ SOLN
INTRAMUSCULAR | Status: DC | PRN
  Administered 2022-03-28: 4 mg via INTRAVENOUS

## 2022-03-28 MED ORDER — BACLOFEN 10 MG PO TABS
40.0000 mg | ORAL_TABLET | Freq: Every day | ORAL | Status: DC
  Administered 2022-03-28 – 2022-04-01 (×5): 40 mg via ORAL
  Filled 2022-03-28 (×5): qty 4

## 2022-03-28 MED ORDER — MELOXICAM 7.5 MG PO TABS
7.5000 mg | ORAL_TABLET | Freq: Every day | ORAL | Status: DC
  Administered 2022-03-28 – 2022-04-02 (×6): 7.5 mg via ORAL
  Filled 2022-03-28 (×7): qty 1

## 2022-03-28 MED ORDER — DOCUSATE SODIUM 100 MG PO CAPS
100.0000 mg | ORAL_CAPSULE | Freq: Two times a day (BID) | ORAL | Status: DC
  Administered 2022-03-28 – 2022-04-02 (×10): 100 mg via ORAL
  Filled 2022-03-28 (×10): qty 1

## 2022-03-28 SURGICAL SUPPLY — 76 items
BAG COUNTER SPONGE SURGICOUNT (BAG) ×2 IMPLANT
BAG DECANTER FOR FLEXI CONT (MISCELLANEOUS) ×2 IMPLANT
BAG SPNG CNTER NS LX DISP (BAG) ×1
BANDAGE ESMARK 6X9 LF (GAUZE/BANDAGES/DRESSINGS) ×1 IMPLANT
BLADE SAG 18X100X1.27 (BLADE) ×2 IMPLANT
BLADE SAGITTAL (BLADE) ×1
BLADE SAW THK.89X75X18XSGTL (BLADE) ×2 IMPLANT
BNDG CMPR 9X6 STRL LF SNTH (GAUZE/BANDAGES/DRESSINGS) ×1
BNDG CMPR MED 10X6 ELC LF (GAUZE/BANDAGES/DRESSINGS) ×1
BNDG CMPR MED 15X6 ELC VLCR LF (GAUZE/BANDAGES/DRESSINGS) ×1
BNDG COHESIVE 6X5 TAN STRL LF (GAUZE/BANDAGES/DRESSINGS) ×2 IMPLANT
BNDG ELASTIC 6X10 VLCR STRL LF (GAUZE/BANDAGES/DRESSINGS) ×1 IMPLANT
BNDG ELASTIC 6X15 VLCR STRL LF (GAUZE/BANDAGES/DRESSINGS) ×2 IMPLANT
BNDG ESMARK 6X9 LF (GAUZE/BANDAGES/DRESSINGS) ×1
BOWL SMART MIX CTS (DISPOSABLE) IMPLANT
CNTNR URN SCR LID CUP LEK RST (MISCELLANEOUS) ×2 IMPLANT
COMP FEM STD PS 8 RT (Joint) ×1 IMPLANT
COMPONENT FEM STD PS 8 RT (Joint) IMPLANT
CONT SPEC 4OZ STRL OR WHT (MISCELLANEOUS) ×1
COVER SURGICAL LIGHT HANDLE (MISCELLANEOUS) ×2 IMPLANT
CUFF TOURN SGL QUICK 34 (TOURNIQUET CUFF) ×1
CUFF TRNQT CYL 34X4.125X (TOURNIQUET CUFF) ×2 IMPLANT
DRAPE INCISE IOBAN 66X45 STRL (DRAPES) IMPLANT
DRAPE ORTHO SPLIT 77X108 STRL (DRAPES) ×3
DRAPE SURG ORHT 6 SPLT 77X108 (DRAPES) ×6 IMPLANT
DRAPE U-SHAPE 47X51 STRL (DRAPES) ×2 IMPLANT
DRSG AQUACEL AG ADV 3.5X14 (GAUZE/BANDAGES/DRESSINGS) ×1 IMPLANT
DURAPREP 26ML APPLICATOR (WOUND CARE) ×4 IMPLANT
ELECT CAUTERY BLADE 6.4 (BLADE) ×2 IMPLANT
ELECT REM PT RETURN 9FT ADLT (ELECTROSURGICAL) ×1
ELECTRODE REM PT RTRN 9FT ADLT (ELECTROSURGICAL) ×1 IMPLANT
GAUZE SPONGE 4X4 12PLY STRL (GAUZE/BANDAGES/DRESSINGS) ×2 IMPLANT
GLOVE BIOGEL PI IND STRL 7.0 (GLOVE) ×2 IMPLANT
GLOVE BIOGEL PI IND STRL 8 (GLOVE) ×2 IMPLANT
GLOVE ECLIPSE 7.0 STRL STRAW (GLOVE) ×2 IMPLANT
GLOVE ECLIPSE 8.0 STRL XLNG CF (GLOVE) ×2 IMPLANT
GLOVE SURG ENC MOIS LTX SZ6.5 (GLOVE) ×6 IMPLANT
GOWN STRL REUS W/ TWL LRG LVL3 (GOWN DISPOSABLE) ×6 IMPLANT
GOWN STRL REUS W/TWL LRG LVL3 (GOWN DISPOSABLE) ×3
HANDPIECE INTERPULSE COAX TIP (DISPOSABLE) ×1
HDLS TROCR DRIL PIN KNEE 75 (PIN) ×1
HOOD PEEL AWAY FLYTE STAYCOOL (MISCELLANEOUS) ×6 IMPLANT
IMMOBILIZER KNEE 22 UNIV (SOFTGOODS) ×1 IMPLANT
IMPL PATELLA METAL SZ32X10 (Joint) ×1 IMPLANT
INSERT TIB AS PERS 8-11X13 (Insert) ×1 IMPLANT
KIT BASIN OR (CUSTOM PROCEDURE TRAY) ×2 IMPLANT
KIT TURNOVER KIT B (KITS) ×2 IMPLANT
MANIFOLD NEPTUNE II (INSTRUMENTS) ×2 IMPLANT
NDL 22X1.5 STRL (OR ONLY) (MISCELLANEOUS) ×4 IMPLANT
NDL SPNL 18GX3.5 QUINCKE PK (NEEDLE) ×2 IMPLANT
NEEDLE 22X1.5 STRL (OR ONLY) (MISCELLANEOUS) ×2
NEEDLE SPNL 18GX3.5 QUINCKE PK (NEEDLE) ×1
NS IRRIG 1000ML POUR BTL (IV SOLUTION) ×4 IMPLANT
PACK TOTAL JOINT (CUSTOM PROCEDURE TRAY) ×2 IMPLANT
PAD ARMBOARD 7.5X6 YLW CONV (MISCELLANEOUS) ×4 IMPLANT
PIN DRILL HDLS TROCAR 75 4PK (PIN) ×1 IMPLANT
PROS TIB KNEE PS 0D F RT (Joint) ×1 IMPLANT
PROSTHESIS TIB KNEE PS 0D F RT (Joint) IMPLANT
SCREW FEMALE HEX FIX 25X2.5 (ORTHOPEDIC DISPOSABLE SUPPLIES) ×1 IMPLANT
SET HNDPC FAN SPRY TIP SCT (DISPOSABLE) ×2 IMPLANT
STRIP CLOSURE SKIN 1/2X4 (GAUZE/BANDAGES/DRESSINGS) ×1 IMPLANT
SUCTION FRAZIER HANDLE 10FR (MISCELLANEOUS) ×1
SUCTION TUBE FRAZIER 10FR DISP (MISCELLANEOUS) ×2 IMPLANT
SUT MNCRL AB 3-0 PS2 18 (SUTURE) ×3 IMPLANT
SUT VIC AB 0 CT1 27 (SUTURE) ×4
SUT VIC AB 0 CT1 27XBRD ANBCTR (SUTURE) ×4 IMPLANT
SUT VIC AB 1 CT1 36 (SUTURE) ×8 IMPLANT
SUT VIC AB 2-0 CT1 27 (SUTURE) ×6
SUT VIC AB 2-0 CT1 TAPERPNT 27 (SUTURE) ×6 IMPLANT
SYR 30ML LL (SYRINGE) ×6 IMPLANT
SYR TB 1ML LUER SLIP (SYRINGE) ×2 IMPLANT
TOWEL GREEN STERILE (TOWEL DISPOSABLE) ×4 IMPLANT
TOWEL GREEN STERILE FF (TOWEL DISPOSABLE) ×4 IMPLANT
TRAY CATH 16FR W/PLASTIC CATH (SET/KITS/TRAYS/PACK) IMPLANT
WATER STERILE IRR 1000ML POUR (IV SOLUTION) IMPLANT
YANKAUER SUCT BULB TIP NO VENT (SUCTIONS) ×2 IMPLANT

## 2022-03-28 NOTE — Transfer of Care (Signed)
Immediate Anesthesia Transfer of Care Note  Patient: Randall Mccormick  Procedure(s) Performed: RIGHT TOTAL KNEE ARTHROPLASTY (Right: Knee)  Patient Location: PACU  Anesthesia Type:GA combined with regional for post-op pain  Level of Consciousness: awake and patient cooperative  Airway & Oxygen Therapy: Patient Spontanous Breathing and Patient connected to face mask oxygen  Post-op Assessment: Report given to RN and Post -op Vital signs reviewed and stable  Post vital signs: Reviewed and stable  Last Vitals:  Vitals Value Taken Time  BP 104/69 03/28/22 1030  Temp    Pulse 91 03/28/22 1034  Resp 18 03/28/22 1034  SpO2 97 % 03/28/22 1034  Vitals shown include unvalidated device data.  Last Pain:  Vitals:   03/28/22 0616  TempSrc:   PainSc: 0-No pain      Patients Stated Pain Goal: 0 (70/35/00 9381)  Complications: No notable events documented.

## 2022-03-28 NOTE — Brief Op Note (Signed)
   03/28/2022  10:32 AM  PATIENT:  Randall Mccormick  66 y.o. male  PRE-OPERATIVE DIAGNOSIS:  right knee osteoarthritis  POST-OPERATIVE DIAGNOSIS:  right knee osteoarthritis  PROCEDURE:  Procedure(s): RIGHT TOTAL KNEE ARTHROPLASTY  SURGEON:  Surgeon(s): Meredith Pel, MD  ASSISTANT: April Green, RNFA  ANESTHESIA:   General  EBL: 75 ml    Total I/O In: 1100 [I.V.:1100] Out: 75 [Blood:75]  BLOOD ADMINISTERED: none  DRAINS: None  LOCAL MEDICATIONS USED: Marcaine morphine clonidine vancomycin powder Exparel  SPECIMEN:  No Specimen  COUNTS:  YES  TOURNIQUET:   Total Tourniquet Time Documented: Thigh (Right) - 74 minutes Total: Thigh (Right) - 74 minutes   DICTATION: .Other Dictation: Dictation Number [id number]  PLAN OF CARE: Admit for overnight observation  PATIENT DISPOSITION:  PACU - hemodynamically stable

## 2022-03-28 NOTE — Progress Notes (Signed)
Orthopedic Tech Progress Note Patient Details:  Muad Noga 01/14/1956 [id number]  CPM on at 1750. Pt expressed understanding of needing to use the bone foam when out of the CPM.  CPM Right Knee CPM Right Knee: On Right Knee Flexion (Degrees): 40 Right Knee Extension (Degrees): 0  Post Interventions Instructions Provided: Care of device, Adjustment of device Ortho Devices Type of Ortho Device: Bone foam zero knee Ortho Device/Splint Location: at bedside for use when out of CPM Ortho Device/Splint Interventions: Ordered   Post Interventions Instructions Provided: Care of device, Adjustment of device  Laticha Ferrucci Jeri Modena 03/28/2022, 6:13 PM

## 2022-03-28 NOTE — Anesthesia Postprocedure Evaluation (Signed)
Anesthesia Post Note  Patient: Randall Mccormick  Procedure(s) Performed: RIGHT TOTAL KNEE ARTHROPLASTY (Right: Knee)     Patient location during evaluation: PACU Anesthesia Type: General Level of consciousness: awake and alert Pain management: pain level controlled Vital Signs Assessment: post-procedure vital signs reviewed and stable Respiratory status: spontaneous breathing, nonlabored ventilation and respiratory function stable Cardiovascular status: stable and blood pressure returned to baseline Anesthetic complications: no   No notable events documented.  Last Vitals:  Vitals:   03/28/22 1315 03/28/22 1330  BP: 113/74 98/69  Pulse: 88 84  Resp: 13 13  Temp:  36.4 C  SpO2: 95% 93%    Last Pain:  Vitals:   03/28/22 1330  TempSrc:   PainSc: [place]

## 2022-03-28 NOTE — Anesthesia Procedure Notes (Addendum)
Anesthesia Regional Block: Adductor canal block   Pre-Anesthetic Checklist: , timeout performed,  Correct Patient, Correct Site, Correct Laterality,  Correct Procedure, Correct Position, site marked,  Risks and benefits discussed,  Surgical consent,  Pre-op evaluation,  At surgeon's request and post-op pain management  Laterality: Right  Prep: chloraprep       Needles:  Injection technique: Single-shot  Needle Type: Echogenic Needle     Needle Length: 10cm  Needle Gauge: 21     Additional Needles:   Narrative:  Start time: 03/28/2022 6:57 AM End time: 03/28/2022 7:00 AM Injection made incrementally with aspirations every 5 mL.  Performed by: Personally  Anesthesiologist: Audry Pili, MD  Additional Notes: No pain on injection. No increased resistance to injection. Injection made in 5cc increments. Good needle visualization. Patient tolerated the procedure well.

## 2022-03-28 NOTE — H&P (Addendum)
TOTAL KNEE ADMISSION H&P  Patient is being admitted for right total knee arthroplasty.  Subjective:  Chief Complaint:right knee pain.  HPI: Randall Mccormick, 66 y.o. male, has a history of pain and functional disability in the right knee due to arthritis and has failed non-surgical conservative treatments for greater than 12 weeks to includeNSAID's and/or analgesics, corticosteriod injections, flexibility and strengthening excercises, and activity modification.  Onset of symptoms was gradual, starting 8 years ago with gradually worsening course since that time. The patient noted no past surgery on the right knee(s).  Patient currently rates pain in the right knee(s) at 9 out of 10 with activity. Patient has night pain, worsening of pain with activity and weight bearing, pain that interferes with activities of daily living, pain with passive range of motion, crepitus, and joint swelling.  Patient has evidence of subchondral sclerosis and joint space narrowing by imaging studies. This patient has had  a long history of right knee pain which has been refractory to nonoperative management.  No personal or family history of DVT or pulmonary embolism . There is no active infection.  Patient Active Problem List   Diagnosis Date Noted   Abnormal gait due to muscle weakness 03/28/2021   Urinary retention 03/28/2021   Incomplete paraplegia ([place]) 09/08/2020   Spasticity 09/08/2020   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/08/2020   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/2020   Hypoalbuminemia due to protein-calorie malnutrition [facility])    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/2020   Constipation    Neurogenic bowel    Neurogenic bladder    Neuropathic pain    Chronic pain syndrome    Anxiety    Cobalamin deficiency    Degeneration of lumbar or lumbosacral intervertebral disc    Hemorrhoids    Hyperlipidemia    Hypertension    Depression    Gouty arthropathy, unspecified    Vitamin D deficiency     Multiple fractures of ribs of left side 04/20/2013   Past Medical History:  Diagnosis Date   Anxiety    Cobalamin deficiency    Degeneration of lumbar or lumbosacral intervertebral disc    Depression    Gouty arthropathy, unspecified    Hemorrhoids    Hyperlipidemia    Hypertension    Multiple fractures of ribs of left side 04/20/2013   FALL FROM A LADDER ON    Vitamin D deficiency     Past Surgical History:  Procedure Laterality Date   APPENDECTOMY     BACK SURGERY     x 6   CHOLECYSTECTOMY  06/24/2008   [facility]   TONSILLECTOMY      Current Facility-Administered Medications  Medication Dose Route Frequency Provider Last Rate Last Admin   ceFAZolin (ANCEF) IVPB 2g/100 mL premix  2 g Intravenous On Call to OR Magnant, Charles L, PA-C       povidone-iodine (BETADINE) 7.5 % scrub   Topical Once Magnant, Charles L, PA-C       povidone-iodine 10 % swab 2 Application  2 Application Topical Once Magnant, Charles L, PA-C       tranexamic acid (CYKLOKAPRON) 2,000 mg in sodium chloride 0.9 % 50 mL Topical Application  6,979 mg Topical To OR Meredith Pel, MD       tranexamic acid (CYKLOKAPRON) IVPB 1,000 mg  1,000 mg Intravenous To OR Magnant, Charles L, PA-C       Facility-Administered Medications Ordered in Other Encounters  Medication Dose Route Frequency Provider Last Rate Last Admin  fentaNYL citrate (PF) (SUBLIMAZE) injection   Intravenous Anesthesia Intra-op Renato Shin, CRNA   50 mcg at 03/28/22 0654   lactated ringers infusion   Intravenous Continuous PRN Renato Shin, CRNA   New Bag at 03/28/22 0631   midazolam (VERSED) 5 MG/5ML injection   Intravenous Anesthesia Intra-op Renato Shin, CRNA   1 mg at 03/28/22 0654   ropivacaine (PF) 7.5 mg/mL (0.75%) (NAROPIN) injection   Peri-NEURAL Anesthesia Intra-op Audry Pili, MD   20 mL at 03/28/22 0700   No Known Allergies  Social History   Tobacco Use   Smoking status: Never   Smokeless tobacco: Former     Types: Chew    Quit date: 2022  Substance Use Topics   Alcohol use: Yes    Comment: 6-8 BEERS A NIGHT    Family History  Problem Relation Age of Onset   Cancer Mother        BREAST   Heart disease Father    Alcoholism Father    Cancer Son        LEUKEMIA     Review of Systems  Musculoskeletal:  Positive for arthralgias.  All other systems reviewed and are negative.   Objective:  Physical Exam Vitals reviewed.  HENT:     Head: Normocephalic.     Nose: Nose normal.     Mouth/Throat:     Mouth: Mucous membranes are moist.  Eyes:     Pupils: Pupils are equal, round, and reactive to light.  Cardiovascular:     Rate and Rhythm: Normal rate.     Pulses: Normal pulses.  Pulmonary:     Effort: Pulmonary effort is normal.  Abdominal:     General: Abdomen is flat.  Musculoskeletal:     Cervical back: Normal range of motion.  Skin:    General: Skin is warm.     Capillary Refill: Capillary refill takes less than 2 seconds.  Neurological:     General: No focal deficit present.     Mental Status: He is alert.  Psychiatric:        Mood and Affect: Mood normal.    Range of motion right knee is about 5-95 with good collateral ligament stability.  Pedal pulses palpable.  Skin intact in the right knee region Vital signs in last 24 hours: Temp:  [98 F (36.7 C)] 98 F (36.7 C) (10/05 0543) Pulse Rate:  [68-81] 71 (10/05 0727) Resp:  [9-18] 14 (10/05 0727) BP: (108-117)/(66-77) 117/77 (10/05 0725) SpO2:  [96 %-98 %] 98 % (10/05 0727) Weight:  [102.1 kg] 102.1 kg (10/05 0543)  Labs:   Estimated body mass index is 34.21 kg/m as calculated from the following:   Height as of this encounter: '5\' 8"'$  (1.727 m).   Weight as of this encounter: 102.1 kg.   Imaging Review Plain radiographs demonstrate severe degenerative joint disease of the right knee(s). The overall alignment ismild varus. The bone quality appears to be good for age and reported activity  level.      Assessment/Plan:  End stage arthritis, right knee   The patient history, physical examination, clinical judgment of the provider and imaging studies are consistent with end stage degenerative joint disease of the right knee(s) and total knee arthroplasty is deemed medically necessary. The treatment options including medical management, injection therapy arthroscopy and arthroplasty were discussed at length. The risks and benefits of total knee arthroplasty were presented and reviewed. The risks due to aseptic loosening, infection, stiffness,  patella tracking problems, thromboembolic complications and other imponderables were discussed. The patient acknowledged the explanation, agreed to proceed with the plan and consent was signed. Patient is being admitted for inpatient treatment for surgery, pain control, PT, OT, prophylactic antibiotics, VTE prophylaxis, progressive ambulation and ADL's and discharge planning. The patient is planning to be discharged to skilled nursing facility     Patient's anticipated LOS is less than 2 midnights, meeting these requirements: - Younger than 83 - Lives within 1 hour of care - Has a competent adult at home to recover with post-op recover - NO history of  - Chronic pain requiring opiods  - Diabetes  - Coronary Artery Disease  - Heart failure  - Heart attack  - Stroke  - DVT/VTE  - Cardiac arrhythmia  - Respiratory Failure/COPD  - Renal failure  - Anemia  - Advanced Liver disease

## 2022-03-28 NOTE — Op Note (Signed)
NAME: Randall Mccormick, Randall Mccormick MEDICAL RECORD NO: [id number] ACCOUNT NO: [id number] DATE OF BIRTH: [birth year] FACILITY: [facility] LOCATION: [facility] PHYSICIAN: Yetta Barre. Marlou Sa, MD  Operative Report   DATE OF PROCEDURE: 03/28/2022  PREOPERATIVE DIAGNOSIS:  Right knee arthritis.  POSTOPERATIVE DIAGNOSIS:  Right knee arthritis.  PROCEDURE:  Right total knee replacement, press-fit using components Persona cruciate-retaining size 8 standard femur, size F spiked keel with 32 mm trabecular metal patella with 13 mm highly cross-linked medial congruent poly.  SURGEON:  Yetta Barre. Marlou Sa, MD  ASSISTANT:  April Green, RNFA  INDICATIONS:  The patient is a 66 year old patient with end-stage right knee arthritis refractory to nonoperative management, who presents for operative management after explanation of risks and benefits.  DESCRIPTION OF PROCEDURE:  The patient was brought to the operating room where general anesthetic was induced.  Preoperative antibiotics administered.  Timeout was called.  Right leg was prescrubbed with alcohol and Betadine, allowed to air dry, prepped  with DuraPrep solution and draped in sterile manner.  Ioban used to cover the operative field.  The leg was elevated and exsanguinated with the Esmarch wrap.  Tourniquet was inflated.  Anterior approach to the knee was made.  Skin and subcutaneous tissue  were sharply divided.  IrriSept solution utilized. Median parapatellar arthrotomy was made and marked with #1 Vicryl suture.  Patella was everted.  Fat pad partially excised.  Anterior horn lateral meniscus released.  Lateral patellofemoral ligament  released.  Soft tissue from the anterior distal femur, removed.  Medial soft tissue dissection in the proximal tibia performed proportional to the patient's mild to moderate preoperative varus deformity.  With the collaterals and posterior neurovascular  structures protected intramedullary alignment was used to make a cut of 10 mm off of the least  affected lateral tibial plateau.  Using intramedullary alignment and 5 degrees of valgus the distal femoral cut was made 10 mm as well.  A 10 and 12 mm spacer  block fit nicely within the extension gap.  Femur was then sized to a size 8 and the anterior, posterior and chamfer cuts were made in 3 degrees of external rotation, which was parallel to the epicondylar axis.  Next, the tibia was sized and rotationally  aligned with the medial third of the tibial tubercle.  With the trial tibial component in position and the femur the patient had a good extension and good stability with a 12 mm spacer.  Patella was then cut down from 25 to 15 thickness and then a  patellar trial button was placed.  With the 12 and 13 mm spacer, the patient had full extension, very good stability to varus and valgus stress at 0, 30 and 90 degrees. Slightly less hyperextension with 13 mm spacer.  The patella tracked well with no  thumbs technique.  Trial components were removed from the patella and the femur. Tibia was keel punched and then those trial components were removed.  Thorough irrigation was performed with 3 liters of irrigating solution, the capsule was then  anesthetized using a combination of Marcaine, saline and Exparel.  TXA sponge was allowed to sit along with IrriSept solution for 3 minutes in the incision.  This was removed and then the canal was irrigated with IrriSept.  Bone plug placed in the femur.   Vancomycin powder placed in the tibial canal.  The tibia was then tapped into position with excellent press fit obtained.  Femur was then tapped into position with excellent press fit obtained.  A 13 poly was  placed along with the patella.  Same  stability parameters were maintained.  A Tourniquet was released.  Bleeding points encountered controlled using electrocautery.  Five liters of irrigating solution utilized at this time.  Next, the arthrotomy was closed over bolster using #1 Vicryl  suture.  Prior to final  arthrotomy closure the knee joint itself was again thoroughly irrigated with IrriSept solution and vancomycin powder was placed.  The arthrotomy was then closed completely using #1 Vicryl suture followed by IrriSept solution,  vancomycin powder, 0 Vicryl suture, 2-0 Vicryl suture, and 3-0 Monocryl with Steri-Strips applied.  Aquacel dressing, Ace wrap, knee immobilizer and iceman applied.  The patient tolerated the procedure well without immediate complications, transferred to  the recovery room in stable condition.   PUS D: 03/28/2022 10:38:25 am T: 03/28/2022 2:01:00 pm  JOB: [id number]/ [id number]

## 2022-03-28 NOTE — Anesthesia Procedure Notes (Addendum)
Procedure Name: Intubation Date/Time: 03/28/2022 7:43 AM  Performed by: Lind Guest, CRNAPre-anesthesia Checklist: Patient identified, Emergency Drugs available, Suction available, Patient being monitored and Timeout performed Patient Re-evaluated:Patient Re-evaluated prior to induction Oxygen Delivery Method: Circle system utilized Preoxygenation: Pre-oxygenation with 100% oxygen Induction Type: IV induction Laryngoscope Size: Glidescope and 3 Grade View: Grade I Tube type: Oral Tube size: 7.5 mm Number of attempts: 1 Placement Confirmation: ETT inserted through vocal cords under direct vision

## 2022-03-29 ENCOUNTER — Other Ambulatory Visit: Payer: Self-pay

## 2022-03-29 ENCOUNTER — Telehealth: Payer: Self-pay

## 2022-03-29 ENCOUNTER — Encounter (HOSPITAL_COMMUNITY): Payer: Self-pay | Admitting: Orthopedic Surgery

## 2022-03-29 DIAGNOSIS — A6923 Arthritis due to Lyme disease: Secondary | ICD-10-CM | POA: Diagnosis present

## 2022-03-29 HISTORY — DX: Arthritis due to Lyme disease: A69.23

## 2022-03-29 MED ORDER — OXYCODONE HCL 10 MG PO TABS: 10.0000 mg | ORAL_TABLET | ORAL | 0 refills | Status: DC | PRN

## 2022-03-29 MED ORDER — ASPIRIN 81 MG PO CHEW: 81.0000 mg | CHEWABLE_TABLET | Freq: Two times a day (BID) | ORAL | 0 refills | Status: DC

## 2022-03-29 MED ORDER — INFLUENZA VAC A&B SA ADJ QUAD 0.5 ML IM PRSY
0.5000 mL | PREFILLED_SYRINGE | INTRAMUSCULAR | Status: DC
  Filled 2022-03-29: qty 0.5

## 2022-03-29 MED ORDER — SODIUM CHLORIDE 0.9 % BOLUS PEDS
500.0000 mL | Freq: Once | INTRAVENOUS | Status: AC
  Administered 2022-03-29: 500 mL via INTRAVENOUS

## 2022-03-29 NOTE — Care Management CC44 (Signed)
Condition Code 44 Documentation Completed  Patient Details  Name: Randall Mccormick MRN: [id number] Date of Birth: 1956-02-04   Condition Code 44 given:  Yes Patient signature on Condition Code 44 notice:  Yes Documentation of 2 MD's agreement:  Yes Code 44 added to claim:  Yes    Sharin Mons, RN 03/29/2022, 5:28 PM

## 2022-03-29 NOTE — Progress Notes (Signed)
Physical Therapy Treatment Patient Details Name: Randall Mccormick MRN: [id number] DOB: 17-Jan-1956 Today's Date: 03/29/2022   History of Present Illness 66 yo male with onset of failed conservative measures of R knee OA was admitted for TKA on 10/5.  Pt has PMHx:  urinary retention, incomplete paraplegia, spasticity, wheelchair dependence, thoracic spondylosis, neurogenic bladder, neuropathic pain, chronic pain, anxiety, gout, HTN, L rib fractures    PT Comments    Pt was seen for a short session due to fatigue and need to return to bed for rest.  He is better able to control balance on walker but will need to be seen for greater control on his own, as well as to get himself up and down, out of bed alone.  Wife is not able to physically assist him and asking for SNF stay still as he is mod assist for all mobility as well as unable to sequence all tasks alone.  Follow acutely for PT goals as outlined on POC.   Recommendations for follow up therapy are one component of a multi-disciplinary discharge planning process, led by the attending physician.  Recommendations may be updated based on patient status, additional functional criteria and insurance authorization.  Follow Up Recommendations  Follow physician's recommendations for discharge plan and follow up therapies Can patient physically be transported by private vehicle: No   Assistance Recommended at Discharge Frequent or constant Supervision/Assistance  Patient can return home with the following A little help with walking and/or transfers;A little help with bathing/dressing/bathroom;Assistance with cooking/housework;Assist for transportation;Help with stairs or ramp for entrance   Equipment Recommendations  None recommended by PT    Recommendations for Other Services       Precautions / Restrictions Precautions Precautions: Fall Precaution Comments: total knee precautions Restrictions RLE Weight Bearing: Weight bearing as tolerated      Mobility  Bed Mobility Overal bed mobility: Needs Assistance Bed Mobility: Sidelying to Sit, Sit to Sidelying, Rolling Rolling: Mod assist Sidelying to sit: Mod assist     Sit to sidelying: Mod assist General bed mobility comments: mod to support  legs back to bed    Transfers Overall transfer level: Needs assistance Equipment used: Rolling walker (2 wheels), 1 person hand held assist Transfers: Sit to/from Stand Sit to Stand: Mod assist           General transfer comment: mod to power up with LLE in immobilzer as ordered    Ambulation/Gait Ambulation/Gait assistance: Min assist Gait Distance (Feet): 25 Feet Assistive device: Rolling walker (2 wheels), 1 person hand held assist Gait Pattern/deviations: Step-through pattern, Decreased stride length, Decreased weight shift to right Gait velocity: reduced Gait velocity interpretation: <1.31 ft/sec, indicative of household ambulator   General Gait Details: good effort but slow to progress and advance RLE   Chief Strategy Officer    Modified Rankin (Stroke Patients Only)       Balance Overall balance assessment: Needs assistance Sitting-balance support: Feet supported Sitting balance-Leahy Scale: Fair     Standing balance support: Bilateral upper extremity supported, During functional activity Standing balance-Leahy Scale: Poor                              Cognition Arousal/Alertness: Awake/alert Behavior During Therapy: WFL for tasks assessed/performed Overall Cognitive Status: Within Functional Limits for tasks assessed  General Comments: requires a bit of instructing but can follow directions        Exercises General Exercises - Lower Extremity Ankle Circles/Pumps: AAROM, AROM, 5 reps Quad Sets: AROM, 10 reps Gluteal Sets: AROM, 10 reps    General Comments General comments (skin integrity, edema, etc.): walked to  bed around it with RW and brace, with good awareness of advancing RLE with walker and maintaining balance control      Pertinent Vitals/Pain Pain Assessment Pain Assessment: Faces Faces Pain Scale: Hurts little more Pain Location: R knee Pain Descriptors / Indicators: Grimacing, Guarding Pain Intervention(s): Monitored during session, Repositioned, Ice applied    Home Living Family/patient expects to be discharged to:: Private residence Living Arrangements: Spouse/significant other Available Help at Discharge: Family;Available 24 hours/day Type of Home: House Home Access: Ramped entrance       Home Layout: One level Home Equipment: Conservation officer, nature (2 wheels);Rollator (4 wheels);Cane - single point;Shower seat;BSC/3in1;Grab bars - tub/shower;Grab bars - toilet;Wheelchair - manual      Prior Function            PT Goals (current goals can now be found in the care plan section) Acute Rehab PT Goals Patient Stated Goal: to get home sooner PT Goal Formulation: With patient/family Time For Goal Achievement: 04/12/22 Potential to Achieve Goals: Good    Frequency    7X/week      PT Plan      Co-evaluation              AM-PAC PT "6 Clicks" Mobility   Outcome Measure  Help needed turning from your back to your side while in a flat bed without using bedrails?: A Lot Help needed moving from lying on your back to sitting on the side of a flat bed without using bedrails?: A Lot Help needed moving to and from a bed to a chair (including a wheelchair)?: A Lot Help needed standing up from a chair using your arms (e.g., wheelchair or bedside chair)?: A Lot Help needed to walk in hospital room?: A Little Help needed climbing 3-5 steps with a railing? : Total 6 Click Score: 12    End of Session Equipment Utilized During Treatment: Gait belt;Other (comment) (ice) Activity Tolerance: Patient tolerated treatment well;Patient limited by pain Patient left: with call  bell/phone within reach;with family/visitor present;in bed;with bed alarm set Nurse Communication: Mobility status PT Visit Diagnosis: Unsteadiness on feet (R26.81);Muscle weakness (generalized) (M62.81);Pain Pain - Right/Left: Right Pain - part of body: Knee     Time: 2951-8841 PT Time Calculation (min) (ACUTE ONLY): 19 min  Charges:  $Gait Training: 8-22 mins      Ramond Dial 03/29/2022, 5:24 PM  Mee Hives, PT PhD Acute Rehab Dept. Number: [place] and [place]

## 2022-03-29 NOTE — Care Management Obs Status (Signed)
[place] NOTIFICATION   Patient Details  Name: Randall Mccormick MRN: [id number] Date of Birth: 11/22/1955   Medicare Observation Status Notification Given:  Yes    Sharin Mons, RN 03/29/2022, 5:28 PM

## 2022-03-29 NOTE — Progress Notes (Signed)
  Subjective: Patient stable.  Pain controlled.  Had difficulty with CPM machine last night.   Objective: Vital signs in last 24 hours: Temp:  [97.6 F (36.4 C)-97.8 F (36.6 C)] 97.7 F (36.5 C) (10/05 2115) Pulse Rate:  [80-99] 80 (10/05 2115) Resp:  [11-21] 16 (10/05 2115) BP: (98-118)/(60-78) 106/63 (10/05 2115) SpO2:  [90 %-97 %] 95 % (10/05 2115)  Intake/Output from previous day: 10/05 0701 - 10/06 0700 In: 1822.5 [P.O.:360; I.V.:1362.5; IV Piggyback:100] Out: 1875 [Urine:1800; Blood:75] Intake/Output this shift: No intake/output data recorded.  Exam:  Neurovascular intact Sensation intact distally Dorsiflexion/Plantar flexion intact  Labs: No results for input(s): "HGB" in the last 72 hours. No results for input(s): "WBC", "RBC", "HCT", "PLT" in the last 72 hours. No results for input(s): "NA", "K", "CL", "CO2", "BUN", "CREATININE", "GLUCOSE", "CALCIUM" in the last 72 hours. No results for input(s): "LABPT", "INR" in the last 72 hours.  Assessment/Plan: Plan at this time is physical therapy twice today.  Social work consult for placement.  Anticipate placement to skilled nursing facility hopefully tomorrow Sunday or at the latest Monday.   Randall Mccormick 03/29/2022, 7:37 AM

## 2022-03-29 NOTE — TOC Initial Note (Addendum)
Transition of Care [facility]) - Initial/Assessment Note    Patient Details  Name: Randall Mccormick MRN: [id number] Date of Birth: 1955-12-09  [facility] CM/SW Contact:    Sharin Mons, RN Phone Number: 03/29/2022, 9:53 AM  Clinical Narrative:            S/P R total knee replacement, 10/5 RNCM received consult for possible SNF placement at time of discharge. RNCM spoke with patient  in regard to ? transition to  SNF placement at time of discharge. PT evaluation pending. Patient reports that spouse is currently unable to care for patient at their home given patient's current physical needs and fall risk.  Pt agreeable to SNF placement at time of discharge if needed. From home with wife. States PTA active with [facility] [place]. Patient reports preference for  [facility] [place] . RNCM discussed insurance authorization process and provided Medicare SNF ratings list. Patient expressed being hopeful for rehab and to feel better soon. No further questions reported at this time. RNCM to continue to follow and assist with discharge planning needs.  Expected Discharge Plan: Skilled Nursing Facility Barriers to Discharge: Continued Medical Work up   Patient Goals and CMS Choice     Choice offered to / list presented to : Patient (Requesting [facility] [place] [facility])  Expected Discharge Plan and Services Expected Discharge Plan: [place]   Discharge Planning Services: CM Consult   Living arrangements for the past 2 months: Single Family Home                                      Prior Living Arrangements/Services Living arrangements for the past 2 months: Single Family Home   Patient language and need for interpreter reviewed:: Yes Do you feel safe going back to the place where you live?: Yes      Need for Family Participation in Patient Care: Yes (Comment) Care giver support system in place?: Yes (comment) Current home services: DME  (RW,W/C) Criminal Activity/Legal Involvement Pertinent to Current Situation/Hospitalization: No - Comment as needed  Activities of Daily Living Home Assistive Devices/Equipment: Eyeglasses, Environmental consultant (specify type), Wheelchair ADL Screening (condition at time of admission) Patient's cognitive ability adequate to safely complete daily activities?: Yes Is the patient deaf or have difficulty hearing?: No Does the patient have difficulty seeing, even when wearing glasses/contacts?: No Does the patient have difficulty concentrating, remembering, or making decisions?: No Patient able to express need for assistance with ADLs?: Yes Does the patient have difficulty dressing or bathing?: Yes Independently performs ADLs?: No Communication: Independent Dressing (OT): Needs assistance Is this a change from baseline?: Pre-admission baseline Grooming: Independent Feeding: Independent Bathing: Needs assistance Is this a change from baseline?: Pre-admission baseline Toileting: Independent with device (comment) Is this a change from baseline?: Pre-admission baseline In/Out Bed: Independent with device (comment) Walks in Home: Independent with device (comment) Does the patient have difficulty walking or climbing stairs?: Yes Weakness of Legs: Both Weakness of Arms/Hands: Both  Permission Sought/Granted   Permission granted to share information with : Yes, Verbal Permission Granted              Emotional Assessment Appearance:: Appears stated age     Orientation: : Oriented to Situation, Oriented to  Time, Oriented to Place, Oriented to Self Alcohol / Substance Use: Not Applicable Psych Involvement: No (comment)  Admission diagnosis:  OA (osteoarthritis) of knee [M17.9]  Arthritis of knee [M17.10] Patient Active Problem List   Diagnosis Date Noted   OA (osteoarthritis) of knee 03/28/2022   Arthritis of knee 03/28/2022   Abnormal gait due to muscle weakness 03/28/2021   Urinary retention  03/28/2021   Incomplete paraplegia ([place]) 09/08/2020   Spasticity 09/08/2020   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/08/2020   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/2020   Hypoalbuminemia due to protein-calorie malnutrition [facility])    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/2020   Constipation    Neurogenic bowel    Neurogenic bladder    Neuropathic pain    Chronic pain syndrome    Anxiety    Cobalamin deficiency    Degeneration of lumbar or lumbosacral intervertebral disc    Hemorrhoids    Hyperlipidemia    Hypertension    Depression    Gouty arthropathy, unspecified    Vitamin D deficiency    Multiple fractures of ribs of left side 04/20/2013   PCP:  [name], MD Pharmacy:   [facility], [place] [address] Phone: [phone] Fax: [phone]  [facility] [place], [place] [address] Phone: [phone] Fax: [phone]     Social Determinants of Health (SDOH) Interventions    Readmission Risk Interventions     No data to display

## 2022-03-29 NOTE — Evaluation (Signed)
Physical Therapy Evaluation Patient Details Name: Randall Mccormick MRN: [id number] DOB: 1955/12/26 Today's Date: 03/29/2022  History of Present Illness  66 yo male with onset of failed conservative measures of R knee OA was admitted for TKA on 10/5.  Pt has PMHx:  urinary retention, incomplete paraplegia, spasticity, wheelchair dependence, thoracic spondylosis, neurogenic bladder, neuropathic pain, chronic pain, anxiety, gout, HTN, L rib fractures  Clinical Impression  Pt was seen for mobility on RW with transition from bed to walk to chair, using good follow through on all mobility.  Pt is demonstrating a limited effort to stand, due to awkwardness of R knee brace and LLE residual weakness from previous TKA.  Follow up with him for goals of acute PT and recommend SNF for recovery of safety with bed mob and transfers, and to increase standing balance and reduce pain with better functional control of RLE.         Recommendations for follow up therapy are one component of a multi-disciplinary discharge planning process, led by the attending physician.  Recommendations may be updated based on patient status, additional functional criteria and insurance authorization.  Follow Up Recommendations Follow physician's recommendations for discharge plan and follow up therapies Can patient physically be transported by private vehicle: No    Assistance Recommended at Discharge Frequent or constant Supervision/Assistance  Patient can return home with the following  A little help with walking and/or transfers;A little help with bathing/dressing/bathroom;Assistance with cooking/housework;Assist for transportation;Help with stairs or ramp for entrance    Equipment Recommendations None recommended by PT  Recommendations for Other Services       Functional Status Assessment Patient has had a recent decline in their functional status and demonstrates the ability to make significant improvements in function in a  reasonable and predictable amount of time.     Precautions / Restrictions Precautions Precautions: Fall Precaution Comments: total knee precautions Restrictions RLE Weight Bearing: Weight bearing as tolerated      Mobility  Bed Mobility Overal bed mobility: Needs Assistance Bed Mobility: Sidelying to Sit, Sit to Sidelying, Rolling Rolling: Mod assist Sidelying to sit: Mod assist     Sit to sidelying: Mod assist General bed mobility comments: mod to support trunk and legs out of bed and mainly legs back to bed    Transfers Overall transfer level: Needs assistance Equipment used: Rolling walker (2 wheels), 1 person hand held assist Transfers: Sit to/from Stand Sit to Stand: Mod assist           General transfer comment: mod to power up with LLE in immobilzer as ordered    Ambulation/Gait Ambulation/Gait assistance: Min assist Gait Distance (Feet): 30 Feet Assistive device: Rolling walker (2 wheels), 1 person hand held assist Gait Pattern/deviations: Step-through pattern, Decreased stride length, Decreased weight shift to right Gait velocity: reduced Gait velocity interpretation: <1.31 ft/sec, indicative of household ambulator   General Gait Details: good effort but slow to progress and advance RLE  Science writer    Modified Rankin (Stroke Patients Only)       Balance Overall balance assessment: Needs assistance Sitting-balance support: Feet supported Sitting balance-Leahy Scale: Fair     Standing balance support: Bilateral upper extremity supported, During functional activity Standing balance-Leahy Scale: Poor                               Pertinent Vitals/Pain Pain Assessment  Pain Assessment: Faces Faces Pain Scale: Hurts little more Pain Location: R knee Pain Descriptors / Indicators: Grimacing, Guarding Pain Intervention(s): Limited activity within patient's tolerance, Monitored during session,  Premedicated before session, Repositioned, Ice applied    Home Living Family/patient expects to be discharged to:: Private residence Living Arrangements: Spouse/significant other Available Help at Discharge: Family;Available 24 hours/day Type of Home: House Home Access: Ramped entrance       Home Layout: One level Home Equipment: Conservation officer, nature (2 wheels);Rollator (4 wheels);Cane - single point;Shower seat;BSC/3in1;Grab bars - tub/shower;Grab bars - toilet;Wheelchair - manual      Prior Function Prior Level of Function : Needs assist       Physical Assist : Mobility (physical) Mobility (physical): Gait;Transfers   Mobility Comments: RW with wife assisting to support walk and transfers       Hand Dominance   Dominant Hand: Right    Extremity/Trunk Assessment   Upper Extremity Assessment Upper Extremity Assessment: Overall WFL for tasks assessed    Lower Extremity Assessment Lower Extremity Assessment: RLE deficits/detail RLE Deficits / Details: stiffness and pain to flex R knee with immobilizer ordered for surgery RLE Coordination: decreased gross motor    Cervical / Trunk Assessment Cervical / Trunk Assessment: Kyphotic (mild, old spinal surgery)  Communication   Communication: No difficulties  Cognition Arousal/Alertness: Awake/alert Behavior During Therapy: WFL for tasks assessed/performed Overall Cognitive Status: Within Functional Limits for tasks assessed                                 General Comments: requires a bit of instructing but can follow directions        General Comments General comments (skin integrity, edema, etc.): Pt is up to walk with PT in immobilizer and has good effort with balancing and managing walker.  Pt is reconnected to ice and instructed on knee ex's    Exercises General Exercises - Lower Extremity Ankle Circles/Pumps: AAROM, AROM, 5 reps Quad Sets: AROM, 10 reps Gluteal Sets: AROM, 10 reps   Assessment/Plan     PT Assessment Patient needs continued PT services  PT Problem List Decreased strength;Decreased range of motion;Decreased activity tolerance;Decreased balance;Decreased mobility;Decreased coordination;Decreased cognition;Decreased knowledge of use of DME;Decreased safety awareness;Cardiopulmonary status limiting activity;Decreased skin integrity;Pain       PT Treatment Interventions DME instruction;Gait training;Stair training;Functional mobility training;Therapeutic activities;Therapeutic exercise;Balance training;Neuromuscular re-education;Patient/family education    PT Goals (Current goals can be found in the Care Plan section)  Acute Rehab PT Goals Patient Stated Goal: to get home sooner PT Goal Formulation: With patient/family Time For Goal Achievement: 04/12/22 Potential to Achieve Goals: Good    Frequency 7X/week     Co-evaluation               AM-PAC PT "6 Clicks" Mobility  Outcome Measure Help needed turning from your back to your side while in a flat bed without using bedrails?: A Lot Help needed moving from lying on your back to sitting on the side of a flat bed without using bedrails?: A Lot Help needed moving to and from a bed to a chair (including a wheelchair)?: A Lot Help needed standing up from a chair using your arms (e.g., wheelchair or bedside chair)?: A Lot Help needed to walk in hospital room?: A Little Help needed climbing 3-5 steps with a railing? : Total 6 Click Score: 12    End of Session Equipment Utilized During Treatment: Gait  belt;Other (comment) (ice) Activity Tolerance: Patient tolerated treatment well;Patient limited by pain Patient left: in chair;with call bell/phone within reach;with chair alarm set;with family/visitor present Nurse Communication: Mobility status PT Visit Diagnosis: Unsteadiness on feet (R26.81);Muscle weakness (generalized) (M62.81);Pain Pain - Right/Left: Right Pain - part of body: Knee    Time: 1112-1140 PT  Time Calculation (min) (ACUTE ONLY): 28 min   Charges:   PT Evaluation $PT Eval Moderate Complexity: 1 Mod PT Treatments $Gait Training: 8-22 mins       Ramond Dial 03/29/2022, 2:31 PM  Mee Hives, PT PhD Acute Rehab Dept. Number: [place] [facility] [place]

## 2022-03-29 NOTE — Plan of Care (Signed)

## 2022-03-29 NOTE — Telephone Encounter (Signed)
Randall Mccormick with Care Management would like order changed from inpatient to observation.  Cb# [id number].  Please advise.  Thank you.

## 2022-03-29 NOTE — NC FL2 (Signed)
[place] LEVEL OF CARE SCREENING TOOL     IDENTIFICATION  Patient Name: Randall Mccormick Birthdate: 05-26-1956 Sex: male Admission Date (Current Location): 03/28/2022  [facility] [place] Number:  Herbalist and Address:  [facility] [place], [place] [address] [place], [place] [address]      Provider Number: [id number]  Attending Physician Name and Address:  Meredith Pel, MD  Relative Name and Phone Number:       Current Level of Care: Hospital Recommended Level of Care: [place] Prior Approval Number:    Date Approved/Denied:   PASRR Number: [id number]  Discharge Plan: SNF    Current Diagnoses: Patient Active Problem List   Diagnosis Date Noted   OA (osteoarthritis) of knee 03/28/2022   Arthritis of knee 03/28/2022   Abnormal gait due to muscle weakness 03/28/2021   Urinary retention 03/28/2021   Incomplete paraplegia ([place]) 09/08/2020   Spasticity 09/08/2020   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/08/2020   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/2020   Hypoalbuminemia due to protein-calorie malnutrition [facility])    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/2020   Constipation    Neurogenic bowel    Neurogenic bladder    Neuropathic pain    Chronic pain syndrome    Anxiety    Cobalamin deficiency    Degeneration of lumbar or lumbosacral intervertebral disc    Hemorrhoids    Hyperlipidemia    Hypertension    Depression    Gouty arthropathy, unspecified    Vitamin D deficiency    Multiple fractures of ribs of left side 04/20/2013    Orientation RESPIRATION BLADDER Height & Weight     Self, Time, Situation, Place  Normal Incontinent Weight: 225 lb (102.1 kg) Height:  '5\' 8"'$  (172.7 cm)  BEHAVIORAL SYMPTOMS/MOOD NEUROLOGICAL BOWEL NUTRITION STATUS      Continent Diet (refer to d/c summary)  AMBULATORY STATUS COMMUNICATION OF NEEDS Skin   Limited Assist Verbally Other (Comment) (S/P RIGHT TOTAL KNEE  ARTHROPLASTY, 10/5)                       Personal Care Assistance Level of Assistance  Bathing, Feeding, Dressing Bathing Assistance: Maximum assistance Feeding assistance: Independent Dressing Assistance: Maximum assistance     Functional Limitations Info  Sight, Hearing, Speech Sight Info: Adequate Hearing Info: Adequate Speech Info: Adequate    SPECIAL CARE FACTORS FREQUENCY  PT (By licensed PT), OT (By licensed OT)     PT Frequency: 5x/week, evaluate and treat OT Frequency: 5x/week, evaluate and treat            Contractures Contractures Info: Not present    Additional Factors Info  Code Status, Allergies Code Status Info: Full Code Allergies Info: No Known Allergies           Current Medications (03/29/2022):  This is the current hospital active medication list Current Facility-Administered Medications  Medication Dose Route Frequency Provider Last Rate Last Admin   0.9% NaCl bolus PEDS  500 mL Intravenous Once Meredith Pel, MD       acetaminophen (TYLENOL) tablet 1,000 mg  1,000 mg Oral Q6H Meredith Pel, MD   1,000 mg at 03/29/22 1113   acetaminophen (TYLENOL) tablet 325-650 mg  325-650 mg Oral Q6H PRN Meredith Pel, MD       allopurinol (ZYLOPRIM) tablet 300 mg  300 mg Oral Daily Meredith Pel, MD   300 mg at 03/29/22 [phone]  ALPRAZolam Duanne Moron) tablet 0.5 mg  0.5 mg Oral TID PRN Meredith Pel, MD       aspirin chewable tablet 81 mg  81 mg Oral BID Meredith Pel, MD   81 mg at 03/29/22 8381   baclofen (LIORESAL) tablet 20 mg  20 mg Oral BID Meredith Pel, MD   20 mg at 03/29/22 1357   baclofen (LIORESAL) tablet 40 mg  40 mg Oral QHS Meredith Pel, MD   40 mg at 03/28/22 2110   bisacodyl (DULCOLAX) EC tablet 10 mg  10 mg Oral Daily PRN Meredith Pel, MD       docusate sodium (COLACE) capsule 100 mg  100 mg Oral BID Meredith Pel, MD   100 mg at 03/29/22 8403   ezetimibe (ZETIA) tablet 10 mg  10  mg Oral QHS Meredith Pel, MD   10 mg at 03/28/22 2110   gabapentin (NEURONTIN) capsule 600 mg  600 mg Oral TID Meredith Pel, MD   600 mg at 03/29/22 7543   HYDROmorphone (DILAUDID) injection 0.5-1 mg  0.5-1 mg Intravenous Q4H PRN Meredith Pel, MD       [START ON 03/30/2022] influenza vaccine adjuvanted (FLUAD) injection 0.5 mL  0.5 mL Intramuscular Tomorrow-1000 Meredith Pel, MD       lisinopril (ZESTRIL) tablet 40 mg  40 mg Oral Daily Meredith Pel, MD   40 mg at 03/29/22 6067   magnesium oxide (MAG-OX) tablet 400 mg  400 mg Oral Daily Meredith Pel, MD   400 mg at 03/29/22 7034   meloxicam (MOBIC) tablet 7.5 mg  7.5 mg Oral Daily Meredith Pel, MD   7.5 mg at 03/29/22 1113   menthol-cetylpyridinium (CEPACOL) lozenge 3 mg  1 lozenge Oral PRN Meredith Pel, MD       Or   phenol (CHLORASEPTIC) mouth spray 1 spray  1 spray Mouth/Throat PRN Meredith Pel, MD       metoCLOPramide (REGLAN) tablet 5-10 mg  5-10 mg Oral Q8H PRN Meredith Pel, MD       Or   metoCLOPramide (REGLAN) injection 5-10 mg  5-10 mg Intravenous Q8H PRN Meredith Pel, MD       OLANZapine (ZYPREXA) tablet 20 mg  20 mg Oral QPM Meredith Pel, MD   20 mg at 03/28/22 1730   ondansetron (ZOFRAN) tablet 4 mg  4 mg Oral Q6H PRN Meredith Pel, MD       Or   ondansetron [facility]) injection 4 mg  4 mg Intravenous Q6H PRN Meredith Pel, MD       oxyCODONE (Oxy IR/ROXICODONE) immediate release tablet 10-15 mg  10-15 mg Oral Q4H PRN Meredith Pel, MD   10 mg at 03/29/22 0244   oxymetazoline (AFRIN) 0.05 % nasal spray 1 spray  1 spray Each Nare BID Meredith Pel, MD   1 spray at 03/29/22 0923   tamsulosin (FLOMAX) capsule 0.4 mg  0.4 mg Oral Daily Meredith Pel, MD   0.4 mg at 03/29/22 0352     Discharge Medications: Please see discharge summary for a list of discharge medications.  Relevant Imaging Results:  Relevant Lab  Results:   Additional Information SS# [id number]  Joanne Chars, LCSW

## 2022-03-29 NOTE — Progress Notes (Signed)
Post bolus vitals

## 2022-03-30 DIAGNOSIS — Z79899 Other long term (current) drug therapy: Secondary | ICD-10-CM | POA: Diagnosis not present

## 2022-03-30 DIAGNOSIS — A6923 Arthritis due to Lyme disease: Secondary | ICD-10-CM | POA: Diagnosis present

## 2022-03-30 DIAGNOSIS — F419 Anxiety disorder, unspecified: Secondary | ICD-10-CM | POA: Diagnosis present

## 2022-03-30 DIAGNOSIS — Z87891 Personal history of nicotine dependence: Secondary | ICD-10-CM | POA: Diagnosis not present

## 2022-03-30 DIAGNOSIS — N319 Neuromuscular dysfunction of bladder, unspecified: Secondary | ICD-10-CM | POA: Diagnosis present

## 2022-03-30 DIAGNOSIS — Z806 Family history of leukemia: Secondary | ICD-10-CM | POA: Diagnosis not present

## 2022-03-30 DIAGNOSIS — F32A Depression, unspecified: Secondary | ICD-10-CM | POA: Diagnosis present

## 2022-03-30 DIAGNOSIS — Z8249 Family history of ischemic heart disease and other diseases of the circulatory system: Secondary | ICD-10-CM | POA: Diagnosis not present

## 2022-03-30 DIAGNOSIS — G894 Chronic pain syndrome: Secondary | ICD-10-CM | POA: Diagnosis present

## 2022-03-30 DIAGNOSIS — I10 Essential (primary) hypertension: Secondary | ICD-10-CM | POA: Diagnosis present

## 2022-03-30 DIAGNOSIS — E785 Hyperlipidemia, unspecified: Secondary | ICD-10-CM | POA: Diagnosis present

## 2022-03-30 DIAGNOSIS — M109 Gout, unspecified: Secondary | ICD-10-CM | POA: Diagnosis present

## 2022-03-30 NOTE — Progress Notes (Signed)
  Subjective: Patient had a better day yesterday.  Still having some pain with CPM   Objective: Vital signs in last 24 hours: Temp:  [97.5 F (36.4 C)-99 F (37.2 C)] 99 F (37.2 C) (10/07 0424) Pulse Rate:  [65-91] 91 (10/07 0424) Resp:  [15-18] 16 (10/07 0424) BP: (78-132)/(45-75) 132/66 (10/07 0424) SpO2:  [93 %-98 %] 98 % (10/07 0424)  Intake/Output from previous day: 10/06 0701 - 10/07 0700 In: -  Out: 600 [Urine:600] Intake/Output this shift: No intake/output data recorded.  Exam:  Dorsiflexion/Plantar flexion intact No cellulitis present Compartment soft  Labs: No results for input(s): "HGB" in the last 72 hours. No results for input(s): "WBC", "RBC", "HCT", "PLT" in the last 72 hours. No results for input(s): "NA", "K", "CL", "CO2", "BUN", "CREATININE", "GLUCOSE", "CALCIUM" in the last 72 hours. No results for input(s): "LABPT", "INR" in the last 72 hours.  Assessment/Plan: Plan at this time is skilled nursing placement on Monday.  Continue with physical therapy today for mobilization.   [address] [place] 03/30/2022, 8:07 AM

## 2022-03-30 NOTE — Progress Notes (Signed)
Physical Therapy Treatment Patient Details Name: Randall Mccormick MRN: [id number] DOB: Aug 02, 1955 Today's Date: 03/30/2022   History of Present Illness 66 yo male with onset of failed conservative measures of R knee OA was admitted for TKA on 10/5.  Pt has PMHx:  urinary retention, incomplete paraplegia, spasticity, wheelchair dependence, thoracic spondylosis, neurogenic bladder, neuropathic pain, chronic pain, anxiety, gout, HTN, L rib fractures    PT Comments    Pt on CPM and ice on arrival to room. Requesting CPM be removed as he has been on it for two hours. Pt progressed to EOB with assist. He made 3 attempts to stand however was unable to take any forward steps. Stating "I can't" each time he attempted to place weight on the R LE. Wife present and encouraging but pt was unable to ambulate today. Pt performed bed level exercises. Will continue to follow acutely. Current plan remains appropriate.     Recommendations for follow up therapy are one component of a multi-disciplinary discharge planning process, led by the attending physician.  Recommendations may be updated based on patient status, additional functional criteria and insurance authorization.  Follow Up Recommendations  Follow physician's recommendations for discharge plan and follow up therapies Can patient physically be transported by private vehicle: No   Assistance Recommended at Discharge Frequent or constant Supervision/Assistance  Patient can return home with the following A little help with walking and/or transfers;A little help with bathing/dressing/bathroom;Assistance with cooking/housework;Assist for transportation;Help with stairs or ramp for entrance   Equipment Recommendations  None recommended by PT    Recommendations for Other Services       Precautions / Restrictions Precautions Precautions: Fall Precaution Comments: total knee precautions Restrictions Weight Bearing Restrictions: Yes RLE Weight Bearing:  Weight bearing as tolerated     Mobility  Bed Mobility Overal bed mobility: Needs Assistance Bed Mobility: Sidelying to Sit, Sit to Sidelying, Rolling Rolling: Mod assist Sidelying to sit: Mod assist     Sit to sidelying: Mod assist General bed mobility comments: mod A for LE management into and off bed    Transfers Overall transfer level: Needs assistance Equipment used: Rolling walker (2 wheels), 1 person hand held assist Transfers: Sit to/from Stand Sit to Stand: Mod assist, +2 physical assistance           General transfer comment: Pt stood 2x from EOB with +1, however struggled to remain upright for longer than 3 seconds. Wife assisted with thrid attempt and pt was able to stand longer.    Ambulation/Gait Ambulation/Gait assistance: Mod assist   Assistive device: Rolling walker (2 wheels)         General Gait Details: pt only able to take two side steps today due to fear and pain limiting.   Stairs             Wheelchair Mobility    Modified Rankin (Stroke Patients Only)       Balance Overall balance assessment: Needs assistance Sitting-balance support: Feet supported Sitting balance-Leahy Scale: Fair     Standing balance support: Bilateral upper extremity supported, During functional activity Standing balance-Leahy Scale: Poor                              Cognition Arousal/Alertness: Awake/alert Behavior During Therapy: WFL for tasks assessed/performed Overall Cognitive Status: Within Functional Limits for tasks assessed  General Comments: requires a bit of instructing but can follow directions        Exercises Total Joint Exercises Towel Squeeze: AROM, 5 reps, Both, Supine General Exercises - Lower Extremity Ankle Circles/Pumps: AAROM, AROM, 5 reps Quad Sets: AROM, 10 reps, Right, Supine Short Arc Quad: AAROM, Right, 5 reps, Supine Heel Slides: AAROM, Right, 5 reps,  Supine Hip ABduction/ADduction: AAROM, Right, 5 reps, Supine    General Comments General comments (skin integrity, edema, etc.): Wife present and helpful      Pertinent Vitals/Pain Pain Assessment Pain Assessment: 0-10 Pain Score: 10-Worst pain ever Pain Location: R knee Pain Descriptors / Indicators: Grimacing, Guarding Pain Intervention(s): Monitored during session, Limited activity within patient's tolerance, Patient requesting pain meds-RN notified    Home Living                          Prior Function            PT Goals (current goals can now be found in the care plan section) Acute Rehab PT Goals Patient Stated Goal: to get home sooner PT Goal Formulation: With patient/family Time For Goal Achievement: 04/12/22 Potential to Achieve Goals: Good Progress towards PT goals: Progressing toward goals (slowly)    Frequency    7X/week      PT Plan Current plan remains appropriate    Co-evaluation              AM-PAC PT "6 Clicks" Mobility   Outcome Measure  Help needed turning from your back to your side while in a flat bed without using bedrails?: A Lot Help needed moving from lying on your back to sitting on the side of a flat bed without using bedrails?: A Lot Help needed moving to and from a bed to a chair (including a wheelchair)?: A Lot Help needed standing up from a chair using your arms (e.g., wheelchair or bedside chair)?: A Lot Help needed to walk in hospital room?: A Little Help needed climbing 3-5 steps with a railing? : Total 6 Click Score: 12    End of Session Equipment Utilized During Treatment: Gait belt;Right knee immobilizer Activity Tolerance: Patient limited by pain;Other (comment) (Fear) Patient left: with call bell/phone within reach;with family/visitor present;in bed Nurse Communication: Patient requests pain meds PT Visit Diagnosis: Unsteadiness on feet (R26.81);Muscle weakness (generalized) (M62.81);Pain Pain -  Right/Left: Right Pain - part of body: Knee     Time: 0768-0881 PT Time Calculation (min) (ACUTE ONLY): 44 min  Charges:  $Therapeutic Exercise: 8-22 mins $Therapeutic Activity: 23-37 mins                     Benjiman Core, PTA Acute Rehab  Allena Katz 03/30/2022, 11:41 AM

## 2022-03-31 NOTE — Progress Notes (Signed)
Physical Therapy Treatment Patient Details Name: Randall Mccormick MRN: [id number] DOB: 11-05-1955 Today's Date: 03/31/2022   History of Present Illness 66 yo male with onset of failed conservative measures of R knee OA was admitted for TKA on 10/5.  Pt has PMHx:  urinary retention, incomplete paraplegia, spasticity, wheelchair dependence, thoracic spondylosis, neurogenic bladder, neuropathic pain, chronic pain, anxiety, gout, HTN, L rib fractures    PT Comments    Continuing work on functional mobility and activity tolerance;  Session focused on R knee stance stability, and with multimodal cues and practice, pt was able to maintain R knee extension in single limb stance for 5 seconds over a span of 5 reps (with bil UE support on RW); Able to take pivot steps bed to recliner with no R knee buckle; I encourage pt to walk next session   Recommendations for follow up therapy are one component of a multi-disciplinary discharge planning process, led by the attending physician.  Recommendations may be updated based on patient status, additional functional criteria and insurance authorization.  Follow Up Recommendations  Follow physician's recommendations for discharge plan and follow up therapies Can patient physically be transported by private vehicle: No   Assistance Recommended at Discharge Frequent or constant Supervision/Assistance  Patient can return home with the following A little help with walking and/or transfers;A little help with bathing/dressing/bathroom;Assistance with cooking/housework;Assist for transportation;Help with stairs or ramp for entrance   Equipment Recommendations  None recommended by PT    Recommendations for Other Services       Precautions / Restrictions Precautions Precautions: Fall Precaution Comments: total knee precautions Restrictions Weight Bearing Restrictions: No RLE Weight Bearing: Weight bearing as tolerated     Mobility  Bed Mobility Overal bed  mobility: Needs Assistance Bed Mobility: Supine to Sit     Supine to sit: Min assist     General bed mobility comments: Cues for technqiue, and min handheld assist to pull to sit and square off hips at EOB    Transfers Overall transfer level: Needs assistance Equipment used: Rolling walker (2 wheels), 1 person hand held assist Transfers: Sit to/from Stand, Bed to chair/wheelchair/BSC Sit to Stand: Mod assist   Step pivot transfers: Min assist       General transfer comment: Mod assist to rise and steady; multimodal cueing for muscle activation around R knee in stance to prevent buckling; After practice with R single limb stance in front of bed, able to take pivot steps bed to recliner with RW and minimal knee buckle    Ambulation/Gait               General Gait Details: pt declined amb today, despite encouragement   Stairs             Wheelchair Mobility    Modified Rankin (Stroke Patients Only)       Balance     Sitting balance-Leahy Scale: Fair       Standing balance-Leahy Scale: Poor                              Cognition Arousal/Alertness: Awake/alert Behavior During Therapy: WFL for tasks assessed/performed Overall Cognitive Status: Within Functional Limits for tasks assessed                                 General Comments: requires a bit of instructing but can follow directions  Exercises      General Comments        Pertinent Vitals/Pain Pain Assessment Pain Assessment: Faces Faces Pain Scale: Hurts even more Pain Location: R knee Pain Descriptors / Indicators: Grimacing, Guarding Pain Intervention(s): Monitored during session    Home Living                          Prior Function            PT Goals (current goals can now be found in the care plan section) Acute Rehab PT Goals Patient Stated Goal: to get home sooner PT Goal Formulation: With patient/family Time For Goal  Achievement: 04/12/22 Potential to Achieve Goals: Good Progress towards PT goals: Progressing toward goals (slowly)    Frequency    7X/week      PT Plan Current plan remains appropriate    Co-evaluation              AM-PAC PT "6 Clicks" Mobility   Outcome Measure  Help needed turning from your back to your side while in a flat bed without using bedrails?: A Lot Help needed moving from lying on your back to sitting on the side of a flat bed without using bedrails?: A Little Help needed moving to and from a bed to a chair (including a wheelchair)?: A Little Help needed standing up from a chair using your arms (e.g., wheelchair or bedside chair)?: A Little Help needed to walk in hospital room?: A Lot Help needed climbing 3-5 steps with a railing? : Total 6 Click Score: 14    End of Session Equipment Utilized During Treatment: Gait belt Activity Tolerance: Patient tolerated treatment well Patient left: in chair;with call bell/phone within reach Nurse Communication: Mobility status PT Visit Diagnosis: Unsteadiness on feet (R26.81);Muscle weakness (generalized) (M62.81);Pain Pain - Right/Left: Right Pain - part of body: Knee     Time: 7564-3329 PT Time Calculation (min) (ACUTE ONLY): 25 min  Charges:  $Therapeutic Activity: 23-37 mins                     Randall Mccormick, PT  Acute Rehabilitation Services Office [phone]    Randall Mccormick 03/31/2022, 6:23 PM

## 2022-03-31 NOTE — TOC Progression Note (Signed)
Transition of Care [facility]) - Progression Note    Patient Details  Name: Treavor Blomquist MRN: [id number] Date of Birth: 1956-03-10  Transition of Care [facility]) CM/SW Contact  Sharin Mons, RN Phone Number: 03/31/2022, 9:39 AM  Clinical Narrative:    NCM spoke with [facility] ( [facility] offered SNF bed. SNF bed will be available on Tuesday,  NCM made pt/ wife  aware. TOC team will continue to monitor and assist  with needs....   Expected Discharge Plan: [place] Barriers to Discharge: Continued Medical Work up  Expected Discharge Plan and Services Expected Discharge Plan: [place]   Discharge Planning Services: CM Consult   Living arrangements for the past 2 months: Single Family Home                                       Social Determinants of Health (SDOH) Interventions    Readmission Risk Interventions     No data to display

## 2022-03-31 NOTE — Progress Notes (Signed)
  Subjective: Moderate pain overnight. Worked with PT. Tolerating diet. Voiding  Objective: Vital signs in last 24 hours: Temp:  [98.3 F (36.8 C)-99.9 F (37.7 C)] 98.3 F (36.8 C) (10/08 0355) Pulse Rate:  [90-109] 95 (10/08 0355) Resp:  [14-19] 14 (10/08 0355) BP: (96-114)/(55-69) 113/56 (10/08 0355) SpO2:  [90 %-93 %] 90 % (10/08 0355)  Intake/Output from previous day: 10/07 0701 - 10/08 0700 In: 360 [P.O.:360] Out: 2000 [Urine:2000] Intake/Output this shift: No intake/output data recorded.  Exam:  Dorsiflexion/Plantar flexion intact No cellulitis present Compartment soft  Labs: No results for input(s): "HGB" in the last 72 hours. No results for input(s): "WBC", "RBC", "HCT", "PLT" in the last 72 hours. No results for input(s): "NA", "K", "CL", "CO2", "BUN", "CREATININE", "GLUCOSE", "CALCIUM" in the last 72 hours. No results for input(s): "LABPT", "INR" in the last 72 hours.  Assessment/Plan: Plan at this time is skilled nursing placement on Monday.  Continue with physical therapy today for mobilization.   Randall Mccormick 03/31/2022, 7:08 AM

## 2022-03-31 NOTE — Progress Notes (Signed)
Redness noted on right knee (surgical area) warm to touch.

## 2022-04-01 MED ORDER — MUPIROCIN 2 % EX OINT
1.0000 | TOPICAL_OINTMENT | Freq: Two times a day (BID) | CUTANEOUS | Status: DC
  Administered 2022-04-01 – 2022-04-02 (×3): 1 via NASAL
  Filled 2022-04-01: qty 22

## 2022-04-01 MED ORDER — CHLORHEXIDINE GLUCONATE CLOTH 2 % EX PADS
6.0000 | MEDICATED_PAD | Freq: Every day | CUTANEOUS | Status: DC
  Administered 2022-04-01 – 2022-04-02 (×2): 6 via TOPICAL

## 2022-04-01 NOTE — Plan of Care (Signed)
  Problem: Education: Goal: Knowledge of the prescribed therapeutic regimen will improve Outcome: Progressing Goal: Individualized Educational Video(s) Outcome: Progressing   Problem: Activity: Goal: Ability to avoid complications of mobility impairment will improve Outcome: Progressing   

## 2022-04-01 NOTE — Progress Notes (Signed)
Mobility Specialist Progress Note   04/01/22 1501  Mobility  Activity Transferred from chair to bed  Level of Assistance Minimal assist, patient does 75% or more  Assistive Device Front wheel walker  Distance Ambulated (ft) 2 ft  RLE Weight Bearing WBAT  Activity Response Tolerated well  $Mobility charge 1 Mobility   Found patient standing in room w/ wife and w/o KI trying to get back to bed. Intervened and safely got pt back to bed w/o fault. Expressed importance on KI and put pt in CPM. Left in supine w/ call bell in reach and needs met.  Holland Falling Mobility Specialist MS [facility] #:  [phone] Acute Rehab Office:  [phone]

## 2022-04-01 NOTE — Telephone Encounter (Signed)
done

## 2022-04-01 NOTE — Progress Notes (Signed)
  Subjective: Patient stable.  Pain well controlled.  Ambulating in the room but not yet in the hall.  Planning for discharge tomorrow to skilled nursing facility   Objective: Vital signs in last 24 hours: Temp:  [98.4 F (36.9 C)-99.3 F (37.4 C)] 99.3 F (37.4 C) (10/09 0850) Pulse Rate:  [87-102] 91 (10/09 0850) Resp:  [16-18] 16 (10/09 0850) BP: (108-130)/(60-76) 130/76 (10/09 0850) SpO2:  [92 %-94 %] 92 % (10/09 0315)  Intake/Output from previous day: 10/08 0701 - 10/09 0700 In: 120 [P.O.:120] Out: 6945 [Urine:4050] Intake/Output this shift: No intake/output data recorded.  Exam:  Sensation intact distally Dorsiflexion/Plantar flexion intact  Labs: No results for input(s): "HGB" in the last 72 hours. No results for input(s): "WBC", "RBC", "HCT", "PLT" in the last 72 hours. No results for input(s): "NA", "K", "CL", "CO2", "BUN", "CREATININE", "GLUCOSE", "CALCIUM" in the last 72 hours. No results for input(s): "LABPT", "INR" in the last 72 hours.  Assessment/Plan: Plan at this time is discharge to skilled nursing facility tomorrow.  Pain medicine prescription on chart.  Discharge summary performed.  Follow-up in approximately 10 days.  Patient has appointment already.   Landry Dyke Tosha Belgarde 04/01/2022, 12:05 PM

## 2022-04-01 NOTE — Progress Notes (Signed)
Physical Therapy Treatment Patient Details Name: Randall Mccormick MRN: [id number] DOB: 04-Dec-1955 Today's Date: 04/01/2022   History of Present Illness 66 yo male with onset of failed conservative measures of R knee OA was admitted for TKA on 10/5.  Pt has PMHx:  urinary retention, incomplete paraplegia, spasticity, wheelchair dependence, thoracic spondylosis, neurogenic bladder, neuropathic pain, chronic pain, anxiety, gout, HTN, L rib fractures    PT Comments    Continuing work on functional mobility and activity tolerance;  Session focused on increasing amb distance safely, and pt was able to walk about 30 ft over 3 bouts of 10 ft with RW; Needed hands-on monitor and occasional manual stabilization of R knee in single limb stance due to occasionally going into flexion; cues to activate quad for R knee stance stability; Overall progressing well; Anticipate continuing good progress at post-acute rehabilitation.   Recommendations for follow up therapy are one component of a multi-disciplinary discharge planning process, led by the attending physician.  Recommendations may be updated based on patient status, additional functional criteria and insurance authorization.  Follow Up Recommendations  Follow physician's recommendations for discharge plan and follow up therapies Can patient physically be transported by private vehicle: No   Assistance Recommended at Discharge Frequent or constant Supervision/Assistance  Patient can return home with the following A little help with walking and/or transfers;A little help with bathing/dressing/bathroom;Assistance with cooking/housework;Assist for transportation;Help with stairs or ramp for entrance   Equipment Recommendations  None recommended by PT    Recommendations for Other Services       Precautions / Restrictions Precautions Precautions: Fall Precaution Comments: total knee precautions Restrictions RLE Weight Bearing: Weight bearing as  tolerated     Mobility  Bed Mobility Overal bed mobility: Needs Assistance Bed Mobility: Supine to Sit     Supine to sit: Min guard     General bed mobility comments: incr time and use of bedrails    Transfers Overall transfer level: Needs assistance Equipment used: Rolling walker (2 wheels) Transfers: Sit to/from Stand Sit to Stand: Mod assist           General transfer comment: Mod assist to rise and steady; multimodal cueing for muscle activation around R knee in stance to prevent buckling; After practice with R single limb stance in front of bed, able to take pivot steps bed to recliner with RW and minimal knee buckle    Ambulation/Gait Ambulation/Gait assistance: Mod assist Gait Distance (Feet): 30 Feet (10 ft x3) Assistive device: Rolling walker (2 wheels) Gait Pattern/deviations: Step-through pattern, Decreased stride length, Decreased weight shift to right Gait velocity: reduced     General Gait Details: Three bouts of walking 82f each with RW and chair pushed behind; tactile monitoring of R knee in stance, and occasional need for manual support due to knee slightlyflexing in single limb stance; needs seated rest breaks, but met the stated goals of walking into the hallway   Stairs             Wheelchair Mobility    Modified Rankin (Stroke Patients Only)       Balance     Sitting balance-Leahy Scale: Fair       Standing balance-Leahy Scale: Poor                              Cognition Arousal/Alertness: Awake/alert Behavior During Therapy: WFL for tasks assessed/performed Overall Cognitive Status: Within Functional Limits for tasks assessed  Exercises Total Joint Exercises Quad Sets: AROM, Right, 10 reps Straight Leg Raises: AAROM, Right, 5 reps Goniometric ROM: approx 0-80    General Comments General comments (skin integrity, edema, etc.): Wife present and  helpful; pushed the chair behind pt      Pertinent Vitals/Pain Pain Assessment Pain Assessment: Faces Faces Pain Scale: Hurts little more Pain Location: R knee Pain Descriptors / Indicators: Grimacing, Guarding Pain Intervention(s): Monitored during session    Home Living                          Prior Function            PT Goals (current goals can now be found in the care plan section) Acute Rehab PT Goals Patient Stated Goal: to get home sooner PT Goal Formulation: With patient/family Time For Goal Achievement: 04/12/22 Potential to Achieve Goals: Good Progress towards PT goals: Progressing toward goals    Frequency    7X/week      PT Plan Current plan remains appropriate    Co-evaluation              AM-PAC PT "6 Clicks" Mobility   Outcome Measure  Help needed turning from your back to your side while in a flat bed without using bedrails?: A Little Help needed moving from lying on your back to sitting on the side of a flat bed without using bedrails?: A Little Help needed moving to and from a bed to a chair (including a wheelchair)?: A Lot Help needed standing up from a chair using your arms (e.g., wheelchair or bedside chair)?: A Little Help needed to walk in hospital room?: A Lot Help needed climbing 3-5 steps with a railing? : Total 6 Click Score: 14    End of Session Equipment Utilized During Treatment: Gait belt Activity Tolerance: Patient tolerated treatment well Patient left: in chair;with call bell/phone within reach Nurse Communication: Mobility status PT Visit Diagnosis: Unsteadiness on feet (R26.81);Muscle weakness (generalized) (M62.81);Pain Pain - Right/Left: Right Pain - part of body: Knee     Time: 7048-8891 PT Time Calculation (min) (ACUTE ONLY): 37 min  Charges:  $Gait Training: 23-37 mins                     Roney Marion, [place] Office [phone]    Colletta Maryland 04/01/2022,  6:09 PM

## 2022-04-01 NOTE — Discharge Summary (Signed)
Physician Discharge Summary      Patient ID: Randall Mccormick MRN: [id number] DOB/AGE: [birth year] 66 y.o.  Admit date: 03/28/2022 Discharge date: 04/02/2022  Admission Diagnoses:  Principal Problem:   OA (osteoarthritis) of knee Active Problems:   Arthritis of knee   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 [facility])   Discharge Diagnoses:  Same  Surgeries: Procedure(s): RIGHT TOTAL KNEE ARTHROPLASTY on 03/28/2022   Consultants:   Discharged Condition: Stable  Hospital Course: Randall Mccormick is an 66 y.o. male who was admitted 03/28/2022 with a chief complaint of right knee pain, and found to have a diagnosis of OA (osteoarthritis) of knee.  They were brought to the operating room on 03/28/2022 and underwent the above named procedures.  Patient tolerated the procedure well.  Was mobilized with physical therapy but was predictably slow to mobilize due to his history of back surgery.  Was walking in the room by the time of discharge.  He will follow-up with [place] in 10 days.  He will require physical therapy 3 hours a day for strengthening as well as range of motion.  Discharged to skilled nursing facility on oxycodone for pain medicine Robaxin as muscle relaxer and aspirin for DVT prophylaxis along with his other preop medications.  Antibiotics given:  Anti-infectives (From admission, onward)    Start     Dose/Rate Route Frequency Ordered Stop   03/28/22 1630  ceFAZolin (ANCEF) IVPB 2g/100 mL premix        2 g 200 mL/hr over 30 Minutes Intravenous Every 6 hours 03/28/22 1540 03/28/22 2253   03/28/22 0855  vancomycin (VANCOCIN) powder  Status:  Discontinued          As needed 03/28/22 0856 03/28/22 1026   03/28/22 0600  ceFAZolin (ANCEF) IVPB 2g/100 mL premix        2 g 200 mL/hr over 30 Minutes Intravenous On call to O.R. 03/28/22 8127 03/28/22 0745     .  Recent vital signs:  Vitals:   04/01/22 0315 04/01/22 0850  BP: 108/64 130/76  Pulse: 87 91  Resp: 16 16  Temp: 98.4 F  (36.9 C) 99.3 F (37.4 C)  SpO2: 92%     Recent laboratory studies:  Results for orders placed or performed during the hospital encounter of 03/28/22  Urine Culture   Specimen: Urine, Clean Catch  Result Value Ref Range   Specimen Description URINE, CLEAN CATCH    Special Requests NONE    Culture      NO GROWTH Performed at [facility], [place] [address], [place], [place] [address]    Report Status 03/22/2022 FINAL   Surgical pcr screen   Specimen: Nasal Mucosa; Nasal Swab  Result Value Ref Range   MRSA, PCR NEGATIVE NEGATIVE   Staphylococcus aureus POSITIVE (A) NEGATIVE  CBC  Result Value Ref Range   WBC 8.5 4.0 - 10.5 K/uL   RBC 4.51 4.22 - 5.81 MIL/uL   Hemoglobin 14.8 13.0 - 17.0 g/dL   HCT 44.3 39.0 - 52.0 %   MCV 98.2 80.0 - 100.0 fL   MCH 32.8 26.0 - 34.0 pg   MCHC 33.4 30.0 - 36.0 g/dL   RDW 14.4 11.5 - 15.5 %   Platelets 238 150 - 400 K/uL   nRBC 0.0 0.0 - 0.2 %  Basic metabolic panel  Result Value Ref Range   Sodium 131 (L) 135 - 145 mmol/L   Potassium 4.4 3.5 - 5.1 mmol/L   Chloride 95 (L) 98 -  111 mmol/L   CO2 28 22 - 32 mmol/L   Glucose, Bld 99 70 - 99 mg/dL   BUN 9 8 - 23 mg/dL   Creatinine, Ser 0.88 0.61 - 1.24 mg/dL   Calcium 8.9 8.9 - 10.3 mg/dL   GFR, Estimated >60 >60 mL/min   Anion gap 8 5 - 15  Urinalysis, Complete w Microscopic  Result Value Ref Range   Color, Urine STRAW (A) YELLOW   APPearance CLEAR CLEAR   Specific Gravity, Urine 1.004 (L) 1.005 - 1.030   pH 7.0 5.0 - 8.0   Glucose, UA NEGATIVE NEGATIVE mg/dL   Hgb urine dipstick NEGATIVE NEGATIVE   Bilirubin Urine NEGATIVE NEGATIVE   Ketones, ur NEGATIVE NEGATIVE mg/dL   Protein, ur NEGATIVE NEGATIVE mg/dL   Nitrite NEGATIVE NEGATIVE   Leukocytes,Ua NEGATIVE NEGATIVE   Bacteria, UA NONE SEEN NONE SEEN   Mucus PRESENT     Discharge Medications:   Allergies as of 04/01/2022   No Known Allergies      Medication List     TAKE these medications    acetaminophen 500 MG  tablet Commonly known as: TYLENOL Take 1,000 mg by mouth every 6 (six) hours as needed for moderate pain.   allopurinol 300 MG tablet Commonly known as: ZYLOPRIM Take 300 mg by mouth daily.   ALPRAZolam 0.5 MG tablet Commonly known as: XANAX Take 0.5 mg by mouth 3 (three) times daily as needed for anxiety.   aspirin 81 MG chewable tablet Chew 1 tablet (81 mg total) by mouth 2 (two) times daily.   baclofen 20 MG tablet Commonly known as: LIORESAL Take 1 tablet (20 mg total) by mouth 4 (four) times daily. (Actually 1 tab in AM; 1 in afternoon and 2 tabs at night- for spasticity What changed:  when to take this additional instructions   bisacodyl 5 MG EC tablet Commonly known as: DULCOLAX Take 10 mg by mouth daily as needed for moderate constipation.   ezetimibe 10 MG tablet Commonly known as: ZETIA Take 10 mg by mouth at bedtime.   gabapentin 300 MG capsule Commonly known as: NEURONTIN Take 1 capsule (300 mg total) by mouth 3 (three) times daily. What changed: how much to take   lisinopril 40 MG tablet Commonly known as: ZESTRIL Take 40 mg by mouth daily.   Magnesium 250 MG Tabs Take 250 mg by mouth daily.   meloxicam 7.5 MG tablet Commonly known as: MOBIC Take 7.5 mg by mouth daily.   metroNIDAZOLE 0.75 % cream Commonly known as: METROCREAM Apply 1 Application topically daily as needed (rosacea).   Mucinex Sinus-Max 5-10-200-325 MG Tabs Generic drug: Phenylephrine-DM-GG-APAP Take 2 tablets by mouth daily as needed (congestion).   OLANZapine 20 MG tablet Commonly known as: ZYPREXA Take 20 mg by mouth every evening.   Oxycodone HCl 10 MG Tabs Take 1 tablet (10 mg total) by mouth every 4 (four) hours as needed for severe pain (pain score 7-10). What changed:  how much to take when to take this reasons to take this   oxymetazoline 0.05 % nasal spray Commonly known as: AFRIN Place 1 spray into both nostrils 2 (two) times daily.   tamsulosin 0.4 MG Caps  capsule Commonly known as: FLOMAX TAKE 1 CAPSULE EVERY DAY AFTER SUPPER   triamcinolone 0.025 % cream Commonly known as: KENALOG Apply 1 Application topically daily as needed (peeling skin).        Diagnostic Studies: XR Knee 1-2 Views Right  Result Date: 03/23/2022 AP  lateral radiographs right knee reviewed.  Mild varus alignment is present.  End-stage tricompartmental arthritis is present worse in the medial compartment with mild subluxation of the femur on the tibia along with bone-on-bone changes.  Moderate to severe arthritis present in the lateral and patellofemoral compartments.   Disposition:        Signed: Anderson Malta 04/01/2022, 12:06 PM

## 2022-04-02 NOTE — Progress Notes (Signed)
Mobility Specialist Progress Note   04/02/22 1051  Mobility  Activity Repositioned in chair  Level of Assistance Standby assist, set-up cues, supervision of patient - no hands on  Range of Motion/Exercises Right leg;Left leg  RLE Weight Bearing WBAT  Activity Response Tolerated well  $Mobility charge 1 Mobility   Received pt in chair having general pain in R knee(5/10) but agreeable to mobility. Practiced seated level exercises w/o any faults. Remained in chair w/ call bell in reach and wife in room.   Holland Falling Mobility Specialist MS [facility] #:  [phone] Acute Rehab Office:  [phone]

## 2022-04-02 NOTE — Care Management Important Message (Signed)
Important Message  Patient Details  Name: Randall Mccormick MRN: [id number] Date of Birth: 10-28-1955   Medicare Important Message Given:  Yes     Hannah Beat 04/02/2022, 10:56 AM

## 2022-04-02 NOTE — TOC Transition Note (Signed)
Transition of Care [facility]) - CM/SW Discharge Note   Patient Details  Name: Randall Mccormick MRN: [id number] Date of Birth: Sep 14, 1955  Transition of Care [facility]) CM/SW Contact:  Joanne Chars, LCSW Phone Number: 04/02/2022, 11:11 AM   Clinical Narrative:   Pt discharging to [facility].  RN call [phone] for report.     Final next level of care: Skilled Nursing Facility Barriers to Discharge: Barriers Resolved   Patient Goals and CMS Choice     Choice offered to / list presented to : Patient (Requesting [facility] Re[facility])  Discharge Placement              Patient chooses bed at:  ([facility]) Patient to be transferred to facility by: [facility] Name of family member notified: wife [name] in room Patient and family notified of of transfer: 04/02/22  Discharge Plan and Services   Discharge Planning Services: CM Consult                                 Social Determinants of Health ([place]) Interventions     Readmission Risk Interventions     No data to display

## 2022-04-05 ENCOUNTER — Emergency Department (HOSPITAL_COMMUNITY): Payer: MEDICARE

## 2022-04-05 ENCOUNTER — Inpatient Hospital Stay (HOSPITAL_COMMUNITY)
Admission: EM | Admit: 2022-04-05 | Discharge: 2022-04-12 | DRG: 871 | Disposition: A | Discharge status: Skilled Nursing Facility | Payer: MEDICARE | Source: Skilled Nursing Facility | Attending: Internal Medicine | Admitting: Internal Medicine

## 2022-04-05 ENCOUNTER — Inpatient Hospital Stay (HOSPITAL_COMMUNITY): Payer: MEDICARE

## 2022-04-05 DIAGNOSIS — E875 Hyperkalemia: Secondary | ICD-10-CM | POA: Diagnosis present

## 2022-04-05 DIAGNOSIS — M51379 Other intervertebral disc degeneration, lumbosacral region without mention of lumbar back pain or lower extremity pain: Secondary | ICD-10-CM | POA: Diagnosis present

## 2022-04-05 DIAGNOSIS — K269 Duodenal ulcer, unspecified as acute or chronic, without hemorrhage or perforation: Secondary | ICD-10-CM | POA: Diagnosis not present

## 2022-04-05 DIAGNOSIS — D539 Nutritional anemia, unspecified: Secondary | ICD-10-CM | POA: Diagnosis present

## 2022-04-05 DIAGNOSIS — R4182 Altered mental status, unspecified: Secondary | ICD-10-CM | POA: Diagnosis not present

## 2022-04-05 DIAGNOSIS — R6521 Severe sepsis with septic shock: Secondary | ICD-10-CM | POA: Diagnosis not present

## 2022-04-05 DIAGNOSIS — Z96651 Presence of right artificial knee joint: Secondary | ICD-10-CM | POA: Diagnosis present

## 2022-04-05 DIAGNOSIS — G8222 Paraplegia, incomplete: Secondary | ICD-10-CM | POA: Diagnosis present

## 2022-04-05 DIAGNOSIS — A419 Sepsis, unspecified organism: Principal | ICD-10-CM | POA: Diagnosis present

## 2022-04-05 DIAGNOSIS — Z1152 Encounter for screening for COVID-19: Secondary | ICD-10-CM | POA: Diagnosis not present

## 2022-04-05 DIAGNOSIS — N19 Unspecified kidney failure: Secondary | ICD-10-CM

## 2022-04-05 DIAGNOSIS — E669 Obesity, unspecified: Secondary | ICD-10-CM | POA: Diagnosis present

## 2022-04-05 DIAGNOSIS — R600 Localized edema: Secondary | ICD-10-CM | POA: Diagnosis present

## 2022-04-05 DIAGNOSIS — Z6372 Alcoholism and drug addiction in family: Secondary | ICD-10-CM

## 2022-04-05 DIAGNOSIS — R578 Other shock: Secondary | ICD-10-CM | POA: Diagnosis present

## 2022-04-05 DIAGNOSIS — F32A Depression, unspecified: Secondary | ICD-10-CM | POA: Diagnosis present

## 2022-04-05 DIAGNOSIS — Z981 Arthrodesis status: Secondary | ICD-10-CM

## 2022-04-05 DIAGNOSIS — N179 Acute kidney failure, unspecified: Secondary | ICD-10-CM

## 2022-04-05 DIAGNOSIS — K267 Chronic duodenal ulcer without hemorrhage or perforation: Secondary | ICD-10-CM | POA: Diagnosis present

## 2022-04-05 DIAGNOSIS — M4804 Spinal stenosis, thoracic region: Secondary | ICD-10-CM | POA: Diagnosis present

## 2022-04-05 DIAGNOSIS — G9340 Encephalopathy, unspecified: Secondary | ICD-10-CM

## 2022-04-05 DIAGNOSIS — G9341 Metabolic encephalopathy: Secondary | ICD-10-CM | POA: Diagnosis present

## 2022-04-05 DIAGNOSIS — E861 Hypovolemia: Secondary | ICD-10-CM | POA: Diagnosis present

## 2022-04-05 DIAGNOSIS — E871 Hypo-osmolality and hyponatremia: Secondary | ICD-10-CM | POA: Diagnosis present

## 2022-04-05 DIAGNOSIS — G253 Myoclonus: Secondary | ICD-10-CM | POA: Diagnosis present

## 2022-04-05 DIAGNOSIS — E785 Hyperlipidemia, unspecified: Secondary | ICD-10-CM | POA: Diagnosis present

## 2022-04-05 DIAGNOSIS — R253 Fasciculation: Secondary | ICD-10-CM

## 2022-04-05 DIAGNOSIS — Z8249 Family history of ischemic heart disease and other diseases of the circulatory system: Secondary | ICD-10-CM

## 2022-04-05 DIAGNOSIS — Z79899 Other long term (current) drug therapy: Secondary | ICD-10-CM

## 2022-04-05 DIAGNOSIS — G894 Chronic pain syndrome: Secondary | ICD-10-CM | POA: Diagnosis present

## 2022-04-05 DIAGNOSIS — Z79891 Long term (current) use of opiate analgesic: Secondary | ICD-10-CM

## 2022-04-05 DIAGNOSIS — J9601 Acute respiratory failure with hypoxia: Secondary | ICD-10-CM | POA: Diagnosis not present

## 2022-04-05 DIAGNOSIS — K920 Hematemesis: Secondary | ICD-10-CM | POA: Diagnosis not present

## 2022-04-05 DIAGNOSIS — Z811 Family history of alcohol abuse and dependence: Secondary | ICD-10-CM

## 2022-04-05 DIAGNOSIS — K592 Neurogenic bowel, not elsewhere classified: Secondary | ICD-10-CM | POA: Diagnosis present

## 2022-04-05 DIAGNOSIS — L8990 Pressure ulcer of unspecified site, unspecified stage: Secondary | ICD-10-CM | POA: Insufficient documentation

## 2022-04-05 DIAGNOSIS — E860 Dehydration: Secondary | ICD-10-CM | POA: Diagnosis present

## 2022-04-05 DIAGNOSIS — I517 Cardiomegaly: Secondary | ICD-10-CM | POA: Diagnosis not present

## 2022-04-05 DIAGNOSIS — Z7982 Long term (current) use of aspirin: Secondary | ICD-10-CM

## 2022-04-05 DIAGNOSIS — R29810 Facial weakness: Secondary | ICD-10-CM | POA: Diagnosis present

## 2022-04-05 DIAGNOSIS — M1710 Unilateral primary osteoarthritis, unspecified knee: Secondary | ICD-10-CM | POA: Diagnosis present

## 2022-04-05 DIAGNOSIS — N319 Neuromuscular dysfunction of bladder, unspecified: Secondary | ICD-10-CM | POA: Diagnosis present

## 2022-04-05 DIAGNOSIS — F419 Anxiety disorder, unspecified: Secondary | ICD-10-CM | POA: Diagnosis present

## 2022-04-05 DIAGNOSIS — K5900 Constipation, unspecified: Secondary | ICD-10-CM | POA: Diagnosis present

## 2022-04-05 DIAGNOSIS — M109 Gout, unspecified: Secondary | ICD-10-CM | POA: Diagnosis present

## 2022-04-05 DIAGNOSIS — R339 Retention of urine, unspecified: Secondary | ICD-10-CM | POA: Diagnosis present

## 2022-04-05 DIAGNOSIS — M5106 Intervertebral disc disorders with myelopathy, lumbar region: Secondary | ICD-10-CM | POA: Diagnosis present

## 2022-04-05 DIAGNOSIS — R579 Shock, unspecified: Secondary | ICD-10-CM | POA: Diagnosis not present

## 2022-04-05 DIAGNOSIS — G928 Other toxic encephalopathy: Secondary | ICD-10-CM | POA: Diagnosis present

## 2022-04-05 DIAGNOSIS — J189 Pneumonia, unspecified organism: Secondary | ICD-10-CM

## 2022-04-05 DIAGNOSIS — M1711 Unilateral primary osteoarthritis, right knee: Secondary | ICD-10-CM | POA: Diagnosis present

## 2022-04-05 DIAGNOSIS — Z6837 Body mass index (BMI) 37.0-37.9, adult: Secondary | ICD-10-CM | POA: Diagnosis not present

## 2022-04-05 DIAGNOSIS — I10 Essential (primary) hypertension: Secondary | ICD-10-CM | POA: Diagnosis present

## 2022-04-05 DIAGNOSIS — D62 Acute posthemorrhagic anemia: Secondary | ICD-10-CM | POA: Diagnosis not present

## 2022-04-05 DIAGNOSIS — F1010 Alcohol abuse, uncomplicated: Secondary | ICD-10-CM | POA: Diagnosis present

## 2022-04-05 DIAGNOSIS — Z791 Long term (current) use of non-steroidal anti-inflammatories (NSAID): Secondary | ICD-10-CM

## 2022-04-05 DIAGNOSIS — J690 Pneumonitis due to inhalation of food and vomit: Secondary | ICD-10-CM | POA: Diagnosis not present

## 2022-04-05 DIAGNOSIS — Z87891 Personal history of nicotine dependence: Secondary | ICD-10-CM | POA: Diagnosis not present

## 2022-04-05 DIAGNOSIS — M5137 Other intervertebral disc degeneration, lumbosacral region: Secondary | ICD-10-CM | POA: Diagnosis present

## 2022-04-05 DIAGNOSIS — M47816 Spondylosis without myelopathy or radiculopathy, lumbar region: Secondary | ICD-10-CM | POA: Diagnosis present

## 2022-04-05 DIAGNOSIS — D649 Anemia, unspecified: Secondary | ICD-10-CM

## 2022-04-05 HISTORY — DX: Encephalopathy, unspecified: G93.40

## 2022-04-05 LAB — COMPREHENSIVE METABOLIC PANEL
ALT: 44 U/L (ref 0–44)
AST: 29 U/L (ref 15–41)
Albumin: 2.7 g/dL — ABNORMAL LOW (ref 3.5–5.0)
Alkaline Phosphatase: 95 U/L (ref 38–126)
Anion gap: 7 (ref 5–15)
BUN: 37 mg/dL — ABNORMAL HIGH (ref 8–23)
CO2: 26 mmol/L (ref 22–32)
Calcium: 7.9 mg/dL — ABNORMAL LOW (ref 8.9–10.3)
Chloride: 100 mmol/L (ref 98–111)
Creatinine, Ser: 1.47 mg/dL — ABNORMAL HIGH (ref 0.61–1.24)
GFR, Estimated: 52 mL/min — ABNORMAL LOW (ref >60)
Glucose, Bld: 110 mg/dL — ABNORMAL HIGH (ref 70–99)
Potassium: 5.4 mmol/L — ABNORMAL HIGH (ref 3.5–5.1)
Sodium: 133 mmol/L — ABNORMAL LOW (ref 135–145)
Total Bilirubin: 0.7 mg/dL (ref 0.3–1.2)
Total Protein: 6.1 g/dL — ABNORMAL LOW (ref 6.5–8.1)

## 2022-04-05 LAB — URINALYSIS, ROUTINE W REFLEX MICROSCOPIC
Bilirubin Urine: NEGATIVE
Glucose, UA: NEGATIVE mg/dL
Hgb urine dipstick: NEGATIVE
Ketones, ur: NEGATIVE mg/dL
Leukocytes,Ua: NEGATIVE
Nitrite: NEGATIVE
Protein, ur: NEGATIVE mg/dL
Specific Gravity, Urine: 1.009 (ref 1.005–1.030)
pH: 5 (ref 5.0–8.0)

## 2022-04-05 LAB — APTT: aPTT: 32 seconds (ref 24–36)

## 2022-04-05 LAB — DIFFERENTIAL
Abs Immature Granulocytes: 0.31 10*3/uL — ABNORMAL HIGH (ref 0.00–0.07)
Basophils Absolute: 0.1 10*3/uL (ref 0.0–0.1)
Basophils Relative: 1 %
Eosinophils Absolute: 0.3 10*3/uL (ref 0.0–0.5)
Eosinophils Relative: 2 %
Immature Granulocytes: 2 %
Lymphocytes Relative: 8 %
Lymphs Abs: 1.1 10*3/uL (ref 0.7–4.0)
Monocytes Absolute: 1.2 10*3/uL — ABNORMAL HIGH (ref 0.1–1.0)
Monocytes Relative: 8 %
Neutro Abs: 12 10*3/uL — ABNORMAL HIGH (ref 1.7–7.7)
Neutrophils Relative %: 79 %

## 2022-04-05 LAB — RAPID URINE DRUG SCREEN, HOSP PERFORMED
Amphetamines: NOT DETECTED
Barbiturates: NOT DETECTED
Benzodiazepines: NOT DETECTED
Cocaine: NOT DETECTED
Opiates: POSITIVE — AB
Tetrahydrocannabinol: NOT DETECTED

## 2022-04-05 LAB — CBC WITH DIFFERENTIAL/PLATELET
Abs Immature Granulocytes: 0.3 10*3/uL — ABNORMAL HIGH (ref 0.00–0.07)
Basophils Absolute: 0.1 10*3/uL (ref 0.0–0.1)
Basophils Relative: 1 %
Eosinophils Absolute: 0.2 10*3/uL (ref 0.0–0.5)
Eosinophils Relative: 2 %
HCT: 39.6 % (ref 39.0–52.0)
Hemoglobin: 12.9 g/dL — ABNORMAL LOW (ref 13.0–17.0)
Immature Granulocytes: 2 %
Lymphocytes Relative: 9 %
Lymphs Abs: 1.3 10*3/uL (ref 0.7–4.0)
MCH: 33.7 pg (ref 26.0–34.0)
MCHC: 32.6 g/dL (ref 30.0–36.0)
MCV: 103.4 fL — ABNORMAL HIGH (ref 80.0–100.0)
Monocytes Absolute: 0.8 10*3/uL (ref 0.1–1.0)
Monocytes Relative: 6 %
Neutro Abs: 12 10*3/uL — ABNORMAL HIGH (ref 1.7–7.7)
Neutrophils Relative %: 80 %
Platelets: 281 10*3/uL (ref 150–400)
RBC: 3.83 MIL/uL — ABNORMAL LOW (ref 4.22–5.81)
RDW: 15.6 % — ABNORMAL HIGH (ref 11.5–15.5)
WBC: 14.7 10*3/uL — ABNORMAL HIGH (ref 4.0–10.5)
nRBC: 0 % (ref 0.0–0.2)

## 2022-04-05 LAB — LACTIC ACID, PLASMA
Lactic Acid, Venous: 2.5 mmol/L (ref 0.5–1.9)
Lactic Acid, Venous: 1.3 mmol/L (ref 0.5–1.9)
Lactic Acid, Venous: 1.2 mmol/L (ref 0.5–1.9)

## 2022-04-05 LAB — RESP PANEL BY RT-PCR (FLU A&B, COVID) ARPGX2
Influenza A by PCR: NEGATIVE
Influenza B by PCR: NEGATIVE
SARS Coronavirus 2 by RT PCR: NEGATIVE

## 2022-04-05 LAB — PROTIME-INR
INR: 1.2 (ref 0.8–1.2)
Prothrombin Time: 14.9 seconds (ref 11.4–15.2)

## 2022-04-05 LAB — CBC
HCT: 37.7 % — ABNORMAL LOW (ref 39.0–52.0)
Hemoglobin: 12.2 g/dL — ABNORMAL LOW (ref 13.0–17.0)
MCH: 33.6 pg (ref 26.0–34.0)
MCHC: 32.4 g/dL (ref 30.0–36.0)
MCV: 103.9 fL — ABNORMAL HIGH (ref 80.0–100.0)
Platelets: 314 10*3/uL (ref 150–400)
RBC: 3.63 MIL/uL — ABNORMAL LOW (ref 4.22–5.81)
RDW: 15.4 % (ref 11.5–15.5)
WBC: 15 10*3/uL — ABNORMAL HIGH (ref 4.0–10.5)
nRBC: 0 % (ref 0.0–0.2)

## 2022-04-05 LAB — I-STAT CHEM 8, ED
BUN: 38 mg/dL — ABNORMAL HIGH (ref 8–23)
Calcium, Ion: 1.02 mmol/L — ABNORMAL LOW (ref 1.15–1.40)
Chloride: 100 mmol/L (ref 98–111)
Creatinine, Ser: 1.6 mg/dL — ABNORMAL HIGH (ref 0.61–1.24)
Glucose, Bld: 105 mg/dL — ABNORMAL HIGH (ref 70–99)
HCT: 39 % (ref 39.0–52.0)
Hemoglobin: 13.3 g/dL (ref 13.0–17.0)
Potassium: 5.2 mmol/L — ABNORMAL HIGH (ref 3.5–5.1)
Sodium: 130 mmol/L — ABNORMAL LOW (ref 135–145)
TCO2: 27 mmol/L (ref 22–32)

## 2022-04-05 LAB — CBG MONITORING, ED: Glucose-Capillary: 105 mg/dL — ABNORMAL HIGH (ref 70–99)

## 2022-04-05 LAB — ETHANOL: Alcohol, Ethyl (B): <10 mg/dL (ref <10)

## 2022-04-05 LAB — HIV ANTIBODY (ROUTINE TESTING W REFLEX): HIV Screen 4th Generation wRfx: NONREACTIVE

## 2022-04-05 MED ORDER — VANCOMYCIN HCL IN DEXTROSE 1-5 GM/200ML-% IV SOLN
1000.0000 mg | INTRAVENOUS | Status: DC
  Administered 2022-04-06: 1000 mg via INTRAVENOUS
  Filled 2022-04-05: qty 200

## 2022-04-05 MED ORDER — POLYETHYLENE GLYCOL 3350 17 G PO PACK
17.0000 g | PACK | Freq: Every day | ORAL | Status: DC
  Administered 2022-04-05 – 2022-04-06 (×2): 17 g via ORAL
  Filled 2022-04-05 (×2): qty 1

## 2022-04-05 MED ORDER — ENOXAPARIN SODIUM 40 MG/0.4ML IJ SOSY
40.0000 mg | PREFILLED_SYRINGE | INTRAMUSCULAR | Status: DC
  Administered 2022-04-05: 40 mg via SUBCUTANEOUS
  Filled 2022-04-05: qty 0.4

## 2022-04-05 MED ORDER — OLANZAPINE 10 MG PO TABS
20.0000 mg | ORAL_TABLET | Freq: Every day | ORAL | Status: DC
  Administered 2022-04-05: 20 mg via ORAL
  Filled 2022-04-05 (×3): qty 2

## 2022-04-05 MED ORDER — EZETIMIBE 10 MG PO TABS
10.0000 mg | ORAL_TABLET | Freq: Every day | ORAL | Status: DC
  Administered 2022-04-06: 10 mg via ORAL
  Filled 2022-04-05: qty 1

## 2022-04-05 MED ORDER — ALLOPURINOL 300 MG PO TABS
300.0000 mg | ORAL_TABLET | Freq: Every day | ORAL | Status: DC
  Administered 2022-04-06: 300 mg via ORAL
  Filled 2022-04-05: qty 3

## 2022-04-05 MED ORDER — ASPIRIN 81 MG PO TBEC: 81.0000 mg | DELAYED_RELEASE_TABLET | Freq: Every day | ORAL | Status: DC

## 2022-04-05 MED ORDER — SODIUM CHLORIDE 0.9 % IV SOLN
2.0000 g | Freq: Once | INTRAVENOUS | Status: AC
  Administered 2022-04-06: 2 g via INTRAVENOUS
  Filled 2022-04-05: qty 12.5

## 2022-04-05 MED ORDER — VANCOMYCIN HCL 2000 MG/400ML IV SOLN
2000.0000 mg | Freq: Once | INTRAVENOUS | Status: AC
  Administered 2022-04-05: 2000 mg via INTRAVENOUS
  Filled 2022-04-05: qty 400

## 2022-04-05 MED ORDER — TAMSULOSIN HCL 0.4 MG PO CAPS: 0.4000 mg | ORAL_CAPSULE | Freq: Every day | ORAL | Status: DC

## 2022-04-05 MED ORDER — LIDOCAINE 5 % EX PTCH
1.0000 | MEDICATED_PATCH | CUTANEOUS | Status: DC
  Administered 2022-04-05 – 2022-04-11 (×7): 1 via TRANSDERMAL
  Filled 2022-04-05 (×7): qty 1

## 2022-04-05 MED ORDER — LACTATED RINGERS IV BOLUS
1000.0000 mL | Freq: Once | INTRAVENOUS | Status: AC
  Administered 2022-04-05: 1000 mL via INTRAVENOUS

## 2022-04-05 MED ORDER — ACETAMINOPHEN 500 MG PO TABS
1000.0000 mg | ORAL_TABLET | Freq: Four times a day (QID) | ORAL | Status: DC
  Administered 2022-04-06 (×2): 1000 mg via ORAL
  Filled 2022-04-05 (×3): qty 2

## 2022-04-05 MED ORDER — SODIUM CHLORIDE 0.9 % IV SOLN
2.0000 g | Freq: Once | INTRAVENOUS | Status: AC
  Administered 2022-04-05: 2 g via INTRAVENOUS
  Filled 2022-04-05: qty 12.5

## 2022-04-05 MED ORDER — SENNA 8.6 MG PO TABS
1.0000 | ORAL_TABLET | Freq: Every day | ORAL | Status: DC
  Administered 2022-04-05 – 2022-04-06 (×2): 8.6 mg via ORAL
  Filled 2022-04-05: qty 1
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
## 2022-04-05 NOTE — ED Notes (Signed)
Bladder scan result is greater than 9104m. MD notified and foley cath ordered at this time. Pt had UO of 12062mtoday and has no GU symptoms. MD aware.

## 2022-04-05 NOTE — ED Notes (Signed)
Unable to draw 2nd blood cultures. MD cleared to start antibiotics with 1 cultures drawn.

## 2022-04-05 NOTE — ED Notes (Signed)
Dinner tray ordered.

## 2022-04-05 NOTE — H&P (Cosign Needed Addendum)
Date: 04/05/2022               Patient Name:  Randall Mccormick MRN: [id number]  DOB: [birth year] Age / Sex: 66 y.o., male   PCP: Randall Dress, MD         Medical Service: Internal Medicine Teaching Service         Attending Physician: Dr. Jimmye Mccormick, Randall Pattee, MD    First Contact: Randall Gasser, MD  Pager: MM [phone]   Second Contact: Randall Mccormick Pager: Randall Mccormick [phone]       After Hours (After 5p/  First Contact Pager: [phone]  weekends / holidays): Second Contact Pager: [phone]   SUBJECTIVE   Chief Complaint: Altered mental status, dysarthria, lower extremity weakness  History of Present Illness:   Randall Mccormick is a 66 y/o person living with a history of depression, HLD, HTN, multiple lumbar surgeries, incomplete paraplegia/thoracic myelopathy, metal fragment in right eye secondary to hunting accident during childhood and with recent right total knee arthroplasty on 03/28/22 who presented to the ED from [facility] after having somnolence, encephalopathy, dysarthria, and lower extremity weakness this morning.   The patient was discharged from the hospital on 10/9 after his right total knee arthoplasty by Randall Mccormick at [facility] to [place] facility. While there he worked with physical therapy and was doing well. His wife visited him at the facility yesterday and noticed that he was somnolent but arousable. She was able to wake him up and feed him lunch. He then went to lay down in his bed, still somnolent but she was able to wake him up for dinner. This morning she received a call from the facility that he was being sent to the hospital as they found him dysarthric and with lower extremity weakness, concerning for an acute CVA.   She denies him having any similar events in the past. Denies history of CVA. He does have some confusion when waking up over the last 8 months, but he will return to baseline. Denies history of OSA.   At bedside, patient denies fever, chills, chest  pain, cough, shortness of breath, nausea, vomiting or diarrhea. Denies difficulty urinating and states he has not had a bowel movement in 2 weeks. Continues to have some right sided knee pain, but denies weakness. Denies difficulties with vision, swallowing. Denies numbness or tingling. He remembers being confused, but states feels better.   Meds:  Current Meds  Medication Sig   acetaminophen (TYLENOL) 325 MG tablet Take 975 mg by mouth every 8 (eight) hours as needed for moderate pain.   allopurinol (ZYLOPRIM) 300 MG tablet Take 300 mg by mouth daily.   ALPRAZolam (XANAX) 0.5 MG tablet Take 0.5 mg by mouth every 8 (eight) hours as needed for anxiety.   aspirin 81 MG chewable tablet Chew 1 tablet (81 mg total) by mouth 2 (two) times daily.   baclofen (LIORESAL) 20 MG tablet Take 1 tablet (20 mg total) by mouth 4 (four) times daily. (Actually 1 tab in AM; 1 in afternoon and 2 tabs at night- for spasticity (Patient taking differently: Take 20-40 mg by mouth See admin instructions. Take 20 mg (1 tablet) twice during the day, and 40 mg (2 tablets) at bedtime for spasticity.)   bisacodyl (DULCOLAX) 5 MG EC tablet Take 10 mg by mouth daily as needed for moderate constipation.   ezetimibe (ZETIA) 10 MG tablet Take 10 mg by mouth at bedtime.   gabapentin (NEURONTIN) 300 MG capsule Take 1 capsule (  300 mg total) by mouth 3 (three) times daily.   Lactulose 20 GM/30ML SOLN Take 20 g by mouth daily as needed (constipation).   lisinopril (ZESTRIL) 40 MG tablet Take 40 mg by mouth daily.   Magnesium 400 MG TABS Take 200 mg by mouth daily.   Magnesium Hydroxide (MILK OF MAGNESIA PO) Take 30 mLs by mouth daily.   meloxicam (MOBIC) 7.5 MG tablet Take 7.5 mg by mouth daily.   metroNIDAZOLE (METROCREAM) 0.75 % cream Apply 1 Application topically daily as needed (rosacea).   OLANZapine (ZYPREXA) 20 MG tablet Take 20 mg by mouth every evening.   oxyCODONE 10 MG TABS Take 1 tablet (10 mg total) by mouth every 4 (four)  hours as needed for severe pain (pain score 7-10).   oxymetazoline (AFRIN) 0.05 % nasal spray Place 1 spray into both nostrils 2 (two) times daily.   polyethylene glycol (MIRALAX / GLYCOLAX) 17 g packet Take 17 g by mouth 2 (two) times daily.   senna (SENOKOT) 8.6 MG tablet Take 2 tablets by mouth at bedtime.   tamsulosin (FLOMAX) 0.4 MG CAPS capsule TAKE 1 CAPSULE EVERY DAY AFTER SUPPER (Patient taking differently: Take 0.4 mg by mouth every evening.)   triamcinolone (KENALOG) 0.025 % cream Apply 1 Application topically daily as needed (peeling skin).   tuberculin 5 UNIT/0.1ML injection Inject 5 Units into the skin once. One dose on 04/02/22. Next dose on 04/09/22 per MAR.   Past Medical History Chronic back pain Right knee OA, s/p total knee replacement HLD HTN Incomplete paraplegia/thoracic myelopathy  Gout Arthritis  Past Surgical History:  Procedure Laterality Date   APPENDECTOMY     BACK SURGERY     x 6   CHOLECYSTECTOMY  06/24/2008   [facility]   TONSILLECTOMY     TOTAL KNEE ARTHROPLASTY Right 03/28/2022   Procedure: RIGHT TOTAL KNEE ARTHROPLASTY;  Surgeon: Randall Pel, MD;  Location: [place];  Service: Orthopedics;  Laterality: Right;   Social:  Lives With: wife at home Occupation: retired Support: Family in the area Level of Function: Uses a walker at baseline, now with wheelchair since right knee surgery. Needs assistance with IADL/ADL PCP: Randall Glassman MD [name] Substances: Denies tobacco use. 2 ounces of liquor nightly. Denies history of withdrawals or hospitalizations for alcohol use. Denies other substance use  Allergies: Allergies as of 04/05/2022   (No Known Allergies)   Review of Systems: A complete ROS was negative except as per HPI.   OBJECTIVE:   Physical Exam: Blood pressure (!) 114/57, pulse 95, temperature 98.2 F (36.8 C), temperature source Oral, resp. rate 16, SpO2 95 %.  Constitutional: no acute distress.  HENT: normocephalic  atraumatic, mucous membranes dry. facial telangiectasias  Eyes: conjunctiva non-erythematous. PERRL. EOMI Neck: supple Cardiovascular: regular rate and rhythm, no m/r/g Pulmonary/Chest: normal work of breathing on room air, lungs clear to auscultation bilaterally Abdominal: soft, non-tender, non-distended MSK: normal bulk and tone. Right knee with bandage in place, limited mobility. No erythema or temperature difference in lower extremities.  Neurological: alert & oriented to self, place, situation but not time (2022, august month), no facial asymmetry, normal sensation. No uvular deviation or tongue fasciculations. Upper extremity strength 5/5. No pronator drift. Finger to nose with some tremors but able to complete. Lower extremity strength 4/5, RLE strength limited by pani in right knee  Skin: warm and dry Psych: pleasant  Labs: CBC    Component Value Date/Time   WBC 15.0 (H) 04/05/2022 0940   RBC  3.63 (L) 04/05/2022 0940   HGB 13.3 04/05/2022 0948   HCT 39.0 04/05/2022 0948   PLT 314 04/05/2022 0940   MCV 103.9 (H) 04/05/2022 0940   MCH 33.6 04/05/2022 0940   MCHC 32.4 04/05/2022 0940   RDW 15.4 04/05/2022 0940   LYMPHSABS 1.1 04/05/2022 0940   MONOABS 1.2 (H) 04/05/2022 0940   EOSABS 0.3 04/05/2022 0940   BASOSABS 0.1 04/05/2022 0940     CMP     Component Value Date/Time   NA 130 (L) 04/05/2022 0948   K 5.2 (H) 04/05/2022 0948   CL 100 04/05/2022 0948   CO2 26 04/05/2022 0940   GLUCOSE 105 (H) 04/05/2022 0948   BUN 38 (H) 04/05/2022 0948   CREATININE 1.60 (H) 04/05/2022 0948   CALCIUM 7.9 (L) 04/05/2022 0940   PROT 6.1 (L) 04/05/2022 0940   ALBUMIN 2.7 (L) 04/05/2022 0940   AST 29 04/05/2022 0940   ALT 44 04/05/2022 0940   ALKPHOS 95 04/05/2022 0940   BILITOT 0.7 04/05/2022 0940   GFRNONAA 52 (L) 04/05/2022 0940    Imaging:  Chest xray - no focal opacity appreciate Head CT w/o C - No hemorrhage or evidence of acute infarct. Non specific metallic FB in  posterior orbit of right eye Renal US - Normal appearance  EKG: personally reviewed my interpretation is normal sinus rhythm. Normal axis and intervals. No ST wave abnormalities. Prior EKG 03/21/22 no acute changes  ASSESSMENT & PLAN:   Assessment & Plan by Problem: Principal Problem:   Acute encephalopathy  Enoc Getter is a 66 y.o. person living with a history of HTN, gout arthritis, anxiety/depression, right knee OA s/p total knee replacement, incomplete paraplegia/thoracic myelopathy chronic back pain who presented with confusion, dysarthria, and myoclonic jerks and admitted for acute encephalopathy on hospital day 0  Acute encephalopathy - toxic vs metabolic Patient with increased somnolence over the past 2 days as well as dysarthria, difficulty lifting legs, and myoclonic jerks. Initial concern for code stroke but negative CT and EEG without evidence seizure activity. Unable to obtain MRI due to metallic object in left eye. With AKI, rapid resolution of symptoms with fluids, and multiple centrally acting medications suspect toxic encephalopathy from his medications. He had improvement in his mentation prior to initiation of abx per nursing staff. While infection is lower on differential, with leukocytosis will continue antibiotics for additional day and follow blood cultures. No sign of infection on physical exam, lungs clear and do no suspect post operative infection at this time. UA without leukocytosis and nitrites. Also contributing is lack of bowel movement in 2 weeks per patient and wife as well as significant urinary retention with around 1L on bladder scan. On follow up exam, patient continues to have some confusion but has improved in terms of somnolence and interactions per wife.  -appreciate neurologies assistance -limit centrally acting medications -s/p 2L LR bolus', encourage PO intake -repeat CT head tomorrow per neurology, unable to obtain MRI with metal fragment in eye despite  MRI's in past -vancomycin and cefepime day 1, follow BC -foley catheter placed for retention and bowel regimen placed  Acute kidney injury Lactic Acidemia 2/2 dehydration Hyperkalemia Hypovolemic hyponatremia Baseline Cr under 1.0. On admission Cr of 1.47. Likely pre-renal in setting of poor PO intake, evidence of hypovolemia on exam and with lactic acidemia. UA without hematuria, proteinuria, or casts. No hydronephrosis on renal US despite urinary retention. S/p 2L LR bolus, continue to encourage PO intake. Likely contributing to encephalopathy with  centrally acting medications. In regards to his hyperkalemia, likely to improve with renal injury resolution. No EKG changes.  -avoid nephrotoxic medications -daily bmp -encourage PO fluids  Urinary retention Patient denies history of urinary retention or difficulty urinating. On flomax at home per wife. With retention of around 1L, foley catheter placed.  -strict I/o -restart flomax -remove foley and trial once encephalopathy normalizes.   Constipation Per wife patient has not had a BM in 2 weeks despite senna at home. Likely in setting of opioids and recent surgery. Will start miralax, senna and if no BM tomorrow, will need suppository.  -senna, miralax daily -suppository if no BM in next 24 hr.   OA of right knee s/p total knee replacement 03/28/22 Patient at [facility] oper[facility]. Supposed to have follow up  with Randall Mccormick next week. Bandage in place until then. Will place PT referral while in the hospital as may need CPM while in bed.  -pain management with tylenol and try to avoid centrally acting medications -PT/OT evaluation, consider use of CPM while admitted -will hold aspirin 81 mg BID for DVT prophylaxis, lovenox while admitted  Depression/Anxiety Home medications of prozac and zyprexa. Will hold this evening and plan to restart tomorrow if mentation continues to improve -restart prozac 20 mg and zyprexa 20 mg qhs  tomorrow if mentation continues to improve  Chronic back pain Patient with multiple lumbar procedures including history of spinal stimulator placement. Appears he follows with [facility]. Will need to hold oxycodone and baclofen with encephalopathy and restart slowly once AKI resolves. Discussed balancing this with patient and wife this evening, they are in agreement.  -add back oxycodone and baclofen as able -lidocaine patch ordered  Macrocytic anemia Folate and B12 pending. Smear pending  Hypertension Hold lisinopril with AKI  Hyperlipidemia  Continue zetia  Hx of goute Continue home allopurinol 300 mg daily  Diet: Heart healthy VTE: Enoxaparin IVF: LR,Bolus Code: Full  Prior to Admission Living Arrangement: [facility]  Anticipated Discharge Location:  [facility] Barriers to Discharge: improvement in mentation  Dispo: Admit patient to Inpatient with expected length of stay greater than 2 midnights.  Signed: Riesa Pope, MD Internal Medicine Resident PGY-3  04/05/2022, 6:48 PM

## 2022-04-05 NOTE — Progress Notes (Signed)
EEG complete - results pending 

## 2022-04-05 NOTE — ED Notes (Signed)
Phlebotomist is coming to draw blood cultures. Attempted twice by RN with no success. Will notify MD.

## 2022-04-05 NOTE — ED Notes (Signed)
Pt had 1000cc output and has no control over his bladder. MD notified.

## 2022-04-05 NOTE — Procedures (Signed)
Patient Name: Randall Mccormick  MRN: [id number]  Epilepsy Attending: Lora Havens  Referring Physician/Provider: Katy Apo, NP Date: 04/05/2022 Duration: 28.11 mins  Patient history: 66yo M with ams. EEG to evaluate for seizure  Level of alertness: Awake  AEDs during EEG study: None  Technical aspects: This EEG study was done with scalp electrodes positioned according to the 10-20 International system of electrode placement. Electrical activity was reviewed with band pass filter of 1-'70Hz'$ , sensitivity of 7 uV/mm, display speed of 70m/sec with a '60Hz'$  notched filter applied as appropriate. EEG data were recorded continuously and digitally stored.  Video monitoring was available and reviewed as appropriate.  Description: The posterior dominant rhythm consists of 8 Hz activity of moderate voltage (25-35 uV) seen predominantly in posterior head regions, symmetric and reactive to eye opening and eye closing. EEG showed continuous generalized 3-6 Hz theta-delta slowing.  Sharp transients were noted in right occipital region.  Patient was noted to have an episode of right upper extremity twitching at 0944.  Concomitant EEG before, during and after the event did not show any EEG changes suggest seizure.   Hyperventilation and photic stimulation were not performed.     ABNORMALITY - Continuous slow, generalized  IMPRESSION: This study is of a mild to moderate diffuse encephalopathy, nonspecific etiology.  No seizures or epileptiform discharges were seen throughout the recording.  Patient was noted to have an episode of right upper extremity twitching at 0944 without concomitant EEG change.  This was not an epileptic event.  Aviyanna Colbaugh OBarbra Sarks

## 2022-04-05 NOTE — Progress Notes (Addendum)
Pharmacy Antibiotic Note  Randall Mccormick is a 66 y.o. male admitted on 04/05/2022 with encephalopathy and sepsis.  Pharmacy has been consulted for vancomycin dosing. Scr 1.60, elevated form baseline of 0.9.  Plan: Vancomycin '2000mg'$  once, then '1000mg'$  Q24h (eAUC 479.3, Scr 1.60, Vd 0.5) Cefepime per MD Monitor renal function for improvement, s/s of infection, follow culutres for pathogen directed therapy     Temp (24hrs), Avg:98 F (36.7 C), Min:98 F (36.7 C), Max:98 F (36.7 C)  Recent Labs  Lab 04/05/22 0940 04/05/22 0948  WBC 15.0*  --   CREATININE 1.47* 1.60*    Estimated Creatinine Clearance: 52.6 mL/min (A) (by C-G formula based on SCr of 1.6 mg/dL (H)).    No Known Allergies  Antimicrobials this admission: Vancomycin 10/13 >>  Cefepime 10/13 >>   Dose adjustments this admission: N/a  Microbiology results: 10/13 BCx: sent  Thank you for allowing pharmacy to be a part of this patient's care.  Titus Dubin, PharmD PGY1 Pharmacy Resident 04/05/2022 11:14 AM

## 2022-04-05 NOTE — Consult Note (Signed)
Neurology Consultation  Reason for Consult: bilateral arm and leg weakness, arm jerking and slurred speech Referring Physician: Dr. Nechama Guard  CC:  code Stroke ,slurred speech  History is obtained from:patient, wife, SNF and chart  HPI: Vernor Monnig is a 66 y.o. male with history of right knee replacement on 03/28/22, multiple back surgeries, anxiety, depression, HLD, HTN, alcoholism and vitamin D deficiency who presented from a rehabilitation center with slurred speech and bilateral weakness.  According to patient's wife, he was very somnolent yesterday around 1300-1400.  He was unable to feed himself dinner when he usually can do this.  He had jerking movements of bilateral arms at this time, and his voice was very quiet, at the level of a whisper.  This morning at 0600, patient awoke and was very diaphoretic.  Staff at the rehabilitation center checked his blood glucose and vital signs and found them to be normal.  At 0740, he was noted to have spilled food from his breakfast and developed weakness of bilateral arms and legs as well as slurred and garbled speech. Code stroke CT scan of the head shows no acute abnormality.  LKW: 1500 10/12 TNK given?: no, outside of window IR Thrombectomy? No, exam not consistent with LVO Modified Rankin Scale: 4-Needs assistance to walk and tend to bodily needs  ROS: A complete ROS was performed and is negative except as noted in the HPI.   Past Medical History:  Diagnosis Date   Anxiety    Cobalamin deficiency    Degeneration of lumbar or lumbosacral intervertebral disc    Depression    Gouty arthropathy, unspecified    Hemorrhoids    Hyperlipidemia    Hypertension    Multiple fractures of ribs of left side 04/20/2013   FALL FROM A LADDER ON    Vitamin D deficiency      Family History  Problem Relation Age of Onset   Cancer Mother        BREAST   Heart disease Father    Alcoholism Father    Cancer Son        LEUKEMIA     Social History:    reports that he has never smoked. He quit smokeless tobacco use about 21 months ago.  His smokeless tobacco use included chew. He reports current alcohol use. He reports that he does not currently use drugs.  Medications No current facility-administered medications for this encounter.  Current Outpatient Medications:    acetaminophen (TYLENOL) 500 MG tablet, Take 1,000 mg by mouth every 6 (six) hours as needed for moderate pain., Disp: , Rfl:    allopurinol (ZYLOPRIM) 300 MG tablet, Take 300 mg by mouth daily., Disp: , Rfl:    ALPRAZolam (XANAX) 0.5 MG tablet, Take 0.5 mg by mouth 3 (three) times daily as needed for anxiety., Disp: , Rfl:    aspirin 81 MG chewable tablet, Chew 1 tablet (81 mg total) by mouth 2 (two) times daily., Disp: 60 tablet, Rfl: 0   baclofen (LIORESAL) 20 MG tablet, Take 1 tablet (20 mg total) by mouth 4 (four) times daily. (Actually 1 tab in AM; 1 in afternoon and 2 tabs at night- for spasticity (Patient taking differently: Take 20 mg by mouth See admin instructions. Take 20 mg in the morning, 20 mg in the afternoon, and 40 mg at night), Disp: 360 each, Rfl: 1   bisacodyl (DULCOLAX) 5 MG EC tablet, Take 10 mg by mouth daily as needed for moderate constipation., Disp: , Rfl:  ezetimibe (ZETIA) 10 MG tablet, Take 10 mg by mouth at bedtime., Disp: , Rfl:    gabapentin (NEURONTIN) 300 MG capsule, Take 1 capsule (300 mg total) by mouth 3 (three) times daily. (Patient taking differently: Take 600 mg by mouth 3 (three) times daily.), Disp: 270 capsule, Rfl: 1   lisinopril (ZESTRIL) 40 MG tablet, Take 40 mg by mouth daily., Disp: , Rfl:    Magnesium 250 MG TABS, Take 250 mg by mouth daily., Disp: , Rfl:    meloxicam (MOBIC) 7.5 MG tablet, Take 7.5 mg by mouth daily., Disp: , Rfl:    metroNIDAZOLE (METROCREAM) 0.75 % cream, Apply 1 Application topically daily as needed (rosacea)., Disp: , Rfl:    OLANZapine (ZYPREXA) 20 MG tablet, Take 20 mg by mouth every evening., Disp: , Rfl:     oxyCODONE 10 MG TABS, Take 1 tablet (10 mg total) by mouth every 4 (four) hours as needed for severe pain (pain score 7-10)., Disp: 35 tablet, Rfl: 0   oxymetazoline (AFRIN) 0.05 % nasal spray, Place 1 spray into both nostrils 2 (two) times daily., Disp: , Rfl:    Phenylephrine-DM-GG-APAP (MUCINEX SINUS-MAX) 5-10-200-325 MG TABS, Take 2 tablets by mouth daily as needed (congestion)., Disp: , Rfl:    tamsulosin (FLOMAX) 0.4 MG CAPS capsule, TAKE 1 CAPSULE EVERY DAY AFTER SUPPER, Disp: 90 capsule, Rfl: 1   triamcinolone (KENALOG) 0.025 % cream, Apply 1 Application topically daily as needed (peeling skin)., Disp: , Rfl:    Exam: Current vital signs: BP 110/67 (BP Location: Right Arm)   Pulse 79   Temp 98 F (36.7 C) (Oral)   Resp 15   SpO2 99%  Vital signs in last 24 hours: Temp:  [98 F (36.7 C)] 98 F (36.7 C) (10/13 0912) Pulse Rate:  [79] 79 (10/13 0912) Resp:  [15] 15 (10/13 0912) BP: (110)/(67) 110/67 (10/13 0912) SpO2:  [99 %] 99 % (10/13 0912)  GENERAL: Awake, alert obese middle-aged Caucasian male, in no acute distress Psych: Affect appropriate for situation, patient is calm and cooperative with examination Head: Normocephalic and atraumatic, without obvious abnormality EENT: Normal conjunctivae, moist mucous membranes, no OP obstruction LUNGS: Normal respiratory effort. Non-labored breathing on room air CV: Regular rate and rhythm on telemetry Extremities: warm, well perfused, without obvious deformity  NEURO:  Mental Status: Awake, alert, and oriented to person, place, time, and situation. He is able to provide a history of present illness. Speech/Language: speech is slightly dysarthric but can be understood with some difficulty.  He has diminished attention, registration and poor recall.  He has easy distractibility..   Naming, repetition, fluency, and comprehension intact without aphasia  No neglect is noted Cranial Nerves:  II: PERRLvisual fields full.  III, IV,  VI: EOMI. Lid elevation symmetric and full.  V: Sensation is intact to light touch and symmetrical to face.  VII: Face is symmetric resting and smiling.  VIII: Hearing intact to voice IX, X: Voice is dysarthric XI: Normal sternocleidomastoid and trapezius muscle strength XII: Tongue protrudes midline without fasciculations.   Motor: 4/5 strength to BUE with mild drift but more related to asterixis than weakness., 2/5 to BLE with jerking motions of right arm noted asterixis noted in both outstretched upper extremities Tone is normal. Bulk is normal.  Sensation: Intact to light touch bilaterally in all four extremities. No extinction to DSS present.  Coordination: FTN intact bilaterally. Some pronator drift or flap DTRs: unable to elicit  Gait: Deferred  NIHSS: 1a Level of  Conscious.: 0 1b LOC Questions: 2 1c LOC Commands: 0 2 Best Gaze: 0 3 Visual: 0 4 Facial Palsy: 0 5a Motor Arm - left: 1 5b Motor Arm - Right: 1 6a Motor Leg - Left: 2 6b Motor Leg - Right: 2 7 Limb Ataxia: 0 8 Sensory: 0 9 Best Language: 0 10 Dysarthria: 1 11 Extinct. and Inatten.: 0 TOTAL: 7   Labs I have reviewed labs in epic and the results pertinent to this consultation are:   CBC    Component Value Date/Time   WBC 8.5 03/21/2022 0930   RBC 4.51 03/21/2022 0930   HGB 14.8 03/21/2022 0930   HCT 44.3 03/21/2022 0930   PLT 238 03/21/2022 0930   MCV 98.2 03/21/2022 0930   MCH 32.8 03/21/2022 0930   MCHC 33.4 03/21/2022 0930   RDW 14.4 03/21/2022 0930   LYMPHSABS 2.1 08/28/2020 0557   MONOABS 0.8 08/28/2020 0557   EOSABS 0.3 08/28/2020 0557   BASOSABS 0.0 08/28/2020 0557    CMP     Component Value Date/Time   NA 131 (L) 03/21/2022 0930   K 4.4 03/21/2022 0930   CL 95 (L) 03/21/2022 0930   CO2 28 03/21/2022 0930   GLUCOSE 99 03/21/2022 0930   BUN 9 03/21/2022 0930   CREATININE 0.88 03/21/2022 0930   CALCIUM 8.9 03/21/2022 0930   PROT 6.0 (L) 08/28/2020 0557   ALBUMIN 3.0 (L)  08/28/2020 0557   AST 22 08/28/2020 0557   ALT 31 08/28/2020 0557   ALKPHOS 92 08/28/2020 0557   BILITOT 0.7 08/28/2020 0557   GFRNONAA >60 03/21/2022 0930    Lipid Panel  No results found for: "CHOL", "TRIG", "HDL", "CHOLHDL", "VLDL", "LDLCALC", "LDLDIRECT"   Imaging I have reviewed the images obtained:  CT-scan of the brain:  No acute abnormality, metallic foreign body in right orbit  MRI examination of the brain: pending  EEG pending  Assessment: 66 year old patient with history of multiple back surgeries, anxiety, depression, HLD, HTN, alcoholism and vitamin D deficiency in rehabilitation for recent knee replacement presents with bilateral arm and leg weakness, jerking movements of arms and slurred speech.  Yesterday afternoon, he was noted to be hard to wake up and was unable to feed himself.  Patient's presentation is consistent with toxic metabolic encephalopathy.  He was noted to have leukocytosis on admission labs, raising concern for infectious process.  CT head was negative for acute abnormality and exam was not consistent with LVO.  Patient's creatinine was elevated at 1.6 (baseline 0.88), so CTA was not done.  Will obtain brain MRI to investigate for stroke as well as MRA and full stroke workup if MRI positive for stroke.  Will obtain EEG to investigate jerking arm movements.  Impression:toxic metabolic encephalopathy likely.  Doubt seizure activity vs. stroke  Recommendations: - brain MRI - EEG - will obtain full stroke workup if MRI shows stroke - workup of toxic metabolic encephalopathy per primary team Hold narcotics for now Pt seen by NP/Neuro and later by MD. Note/plan to be edited by MD as needed.  Dent , MSN, AGACNP-BC [facility] See Amion for schedule and pager information 04/05/2022 9:20 AM   STROKE MD NOTE :  I have personally obtained history,examined this patient, reviewed notes, independently viewed imaging studies,  participated in medical decision making and plan of care.ROS completed by me personally and pertinent positives fully documented  I have made any additions or clarifications directly to the above note.  Agree with note above.  Patient presented with 2-day history of progressive altered mental status and dysarthria with generalized weakness without definite focality to suggest stroke.  Neurological exam suggests encephalopathy given prominent asterixis bilaterally no focal weakness.  He likely has toxic metabolic encephalopathy related to his narcotic medications but elevated white count may also point towards infection.  Recommend admit to medical team for further work-up.  Check chest x-ray, UA and hold narcotic and other CNS acting medications.  EEG shows mild diffuse slowing without definite epileptiform activity.  Check MRI patient has not recovered back tomorrow.  No family available at the bedside for discussion.  Discussed with ER MD and answered questions.  Greater than 50% time during this 55-minute consultation visit was spent in counseling and coordination of care about presentation and discussion about differential diagnosis and evaluation and treatment plan discussion with patient and care team  Antony Contras, MD Medical Director [place] Pager: [phone] 04/05/2022 1:49 PM

## 2022-04-05 NOTE — Code Documentation (Signed)
Stroke Response Nurse Documentation Code Documentation  Randall Mccormick is a 66 y.o. male arriving to [facility]  via [facility] on 04/05/2022 with past medical hx of Anxiety, HLD, HTN. Code stroke was activated by EMS.   Patient from [facility] where he was LKW at 1500 yesterday when day shift reported that patient was at his baseline. Pt is at [facility] for rehab after right knee replacement four days ago. This morning, he was noted to have slurred speech and generalized weakness by staff. Night shift did not report changes in patient, but they had already left and unknown LKW through the night. Day shift reported 1500 as baseline.    Stroke team at the bedside on patient arrival. Labs drawn and patient cleared for CT by Dr. Langston Masker. Patient to CT with team. NIHSS 9, see documentation for details and code stroke times. Patient with disoriented, bilateral arm weakness, bilateral leg weakness, and dysarthria  on exam. The following imaging was completed:  CT Head. Patient is not a candidate for IV Thrombolytic due to outside window. Patient is not a candidate for IR due to LVO not suspected.   Care Plan: Code Stroke cancelled. MD thinks encephalopathy.   Bedside handoff with ED RN Katrina.    Kathrin Greathouse  Stroke Response RN

## 2022-04-05 NOTE — ED Triage Notes (Signed)
Code stroke from [facility]. LKN at [place] yesterday. Came in for slurred speech and bilateral weakness. Alert and oriented.

## 2022-04-06 ENCOUNTER — Inpatient Hospital Stay (HOSPITAL_COMMUNITY): Payer: MEDICARE

## 2022-04-06 ENCOUNTER — Encounter (HOSPITAL_COMMUNITY)
Admission: EM | Disposition: A | Discharge status: Skilled Nursing Facility | Payer: Self-pay | Source: Skilled Nursing Facility | Attending: Internal Medicine

## 2022-04-06 DIAGNOSIS — G9340 Encephalopathy, unspecified: Secondary | ICD-10-CM | POA: Diagnosis not present

## 2022-04-06 DIAGNOSIS — K269 Duodenal ulcer, unspecified as acute or chronic, without hemorrhage or perforation: Secondary | ICD-10-CM

## 2022-04-06 DIAGNOSIS — N179 Acute kidney failure, unspecified: Secondary | ICD-10-CM | POA: Diagnosis not present

## 2022-04-06 DIAGNOSIS — D62 Acute posthemorrhagic anemia: Secondary | ICD-10-CM

## 2022-04-06 DIAGNOSIS — A419 Sepsis, unspecified organism: Secondary | ICD-10-CM | POA: Diagnosis not present

## 2022-04-06 DIAGNOSIS — R6521 Severe sepsis with septic shock: Secondary | ICD-10-CM | POA: Diagnosis not present

## 2022-04-06 DIAGNOSIS — K920 Hematemesis: Secondary | ICD-10-CM

## 2022-04-06 DIAGNOSIS — R579 Shock, unspecified: Secondary | ICD-10-CM

## 2022-04-06 DIAGNOSIS — I517 Cardiomegaly: Secondary | ICD-10-CM

## 2022-04-06 DIAGNOSIS — E875 Hyperkalemia: Secondary | ICD-10-CM

## 2022-04-06 HISTORY — PX: ESOPHAGOGASTRODUODENOSCOPY: SHX5428

## 2022-04-06 LAB — CBC WITH DIFFERENTIAL/PLATELET
Abs Immature Granulocytes: 0.89 10*3/uL — ABNORMAL HIGH (ref 0.00–0.07)
Abs Immature Granulocytes: 0 10*3/uL (ref 0.00–0.07)
Basophils Absolute: 0.1 10*3/uL (ref 0.0–0.1)
Basophils Absolute: 0.2 10*3/uL — ABNORMAL HIGH (ref 0.0–0.1)
Basophils Relative: 1 %
Basophils Relative: 1 %
Eosinophils Absolute: 0.1 10*3/uL (ref 0.0–0.5)
Eosinophils Absolute: 0 10*3/uL (ref 0.0–0.5)
Eosinophils Relative: 0 %
Eosinophils Relative: 0 %
HCT: 30.1 % — ABNORMAL LOW (ref 39.0–52.0)
HCT: 29.8 % — ABNORMAL LOW (ref 39.0–52.0)
Hemoglobin: 10 g/dL — ABNORMAL LOW (ref 13.0–17.0)
Hemoglobin: 9.6 g/dL — ABNORMAL LOW (ref 13.0–17.0)
Immature Granulocytes: 5 %
Lymphocytes Relative: 6 %
Lymphocytes Relative: 3 %
Lymphs Abs: 1.2 10*3/uL (ref 0.7–4.0)
Lymphs Abs: 0.7 10*3/uL (ref 0.7–4.0)
MCH: 33.3 pg (ref 26.0–34.0)
MCH: 33.3 pg (ref 26.0–34.0)
MCHC: 33.2 g/dL (ref 30.0–36.0)
MCHC: 32.2 g/dL (ref 30.0–36.0)
MCV: 100.3 fL — ABNORMAL HIGH (ref 80.0–100.0)
MCV: 103.5 fL — ABNORMAL HIGH (ref 80.0–100.0)
Monocytes Absolute: 1.6 10*3/uL — ABNORMAL HIGH (ref 0.1–1.0)
Monocytes Absolute: 0.5 10*3/uL (ref 0.1–1.0)
Monocytes Relative: 8 %
Monocytes Relative: 2 %
Neutro Abs: 15.5 10*3/uL — ABNORMAL HIGH (ref 1.7–7.7)
Neutro Abs: 23.2 10*3/uL — ABNORMAL HIGH (ref 1.7–7.7)
Neutrophils Relative %: 80 %
Neutrophils Relative %: 94 %
Platelets: 348 10*3/uL (ref 150–400)
Platelets: 476 10*3/uL — ABNORMAL HIGH (ref 150–400)
RBC: 3 MIL/uL — ABNORMAL LOW (ref 4.22–5.81)
RBC: 2.88 MIL/uL — ABNORMAL LOW (ref 4.22–5.81)
RDW: 15.2 % (ref 11.5–15.5)
RDW: 15.5 % (ref 11.5–15.5)
WBC: 19.3 10*3/uL — ABNORMAL HIGH (ref 4.0–10.5)
WBC: 24.7 10*3/uL — ABNORMAL HIGH (ref 4.0–10.5)
nRBC: 0 % (ref 0.0–0.2)
nRBC: 0 /100 WBC
nRBC: 0.1 % (ref 0.0–0.2)

## 2022-04-06 LAB — ECHOCARDIOGRAM COMPLETE
AR max vel: 2.6 cm2
AV Area VTI: 2.64 cm2
AV Area mean vel: 2.52 cm2
AV Mean grad: 4 mmHg
AV Peak grad: 8 mmHg
Ao pk vel: 1.41 m/s
S' Lateral: 3.2 cm

## 2022-04-06 LAB — POCT I-STAT 7, (LYTES, BLD GAS, ICA,H+H)
Acid-base deficit: 6 mmol/L — ABNORMAL HIGH (ref 0.0–2.0)
Bicarbonate: 18.4 mmol/L — ABNORMAL LOW (ref 20.0–28.0)
Calcium, Ion: 1.08 mmol/L — ABNORMAL LOW (ref 1.15–1.40)
HCT: 20 % — ABNORMAL LOW (ref 39.0–52.0)
Hemoglobin: 6.8 g/dL — CL (ref 13.0–17.0)
O2 Saturation: 100 %
Patient temperature: 97.8
Potassium: 4.5 mmol/L (ref 3.5–5.1)
Sodium: 137 mmol/L (ref 135–145)
TCO2: 19 mmol/L — ABNORMAL LOW (ref 22–32)
pCO2 arterial: 31.8 mmHg — ABNORMAL LOW (ref 32–48)
pH, Arterial: 7.367 (ref 7.35–7.45)
pO2, Arterial: 464 mmHg — ABNORMAL HIGH (ref 83–108)

## 2022-04-06 LAB — POCT I-STAT EG7
Acid-base deficit: 4 mmol/L — ABNORMAL HIGH (ref 0.0–2.0)
Bicarbonate: 20.7 mmol/L (ref 20.0–28.0)
Calcium, Ion: 1.04 mmol/L — ABNORMAL LOW (ref 1.15–1.40)
HCT: 20 % — ABNORMAL LOW (ref 39.0–52.0)
Hemoglobin: 6.8 g/dL — CL (ref 13.0–17.0)
O2 Saturation: 69 %
Patient temperature: 97.8
Potassium: 4.5 mmol/L (ref 3.5–5.1)
Sodium: 143 mmol/L (ref 135–145)
TCO2: 22 mmol/L (ref 22–32)
pCO2, Ven: 34.4 mmHg — ABNORMAL LOW (ref 44–60)
pH, Ven: 7.385 (ref 7.25–7.43)
pO2, Ven: 36 mmHg (ref 32–45)

## 2022-04-06 LAB — COMPREHENSIVE METABOLIC PANEL
ALT: 51 U/L — ABNORMAL HIGH (ref 0–44)
AST: 53 U/L — ABNORMAL HIGH (ref 15–41)
Albumin: 2.3 g/dL — ABNORMAL LOW (ref 3.5–5.0)
Alkaline Phosphatase: 83 U/L (ref 38–126)
Anion gap: 14 (ref 5–15)
BUN: 71 mg/dL — ABNORMAL HIGH (ref 8–23)
CO2: 16 mmol/L — ABNORMAL LOW (ref 22–32)
Calcium: 7.9 mg/dL — ABNORMAL LOW (ref 8.9–10.3)
Chloride: 105 mmol/L (ref 98–111)
Creatinine, Ser: 1.23 mg/dL (ref 0.61–1.24)
GFR, Estimated: >60 mL/min (ref >60)
Glucose, Bld: 197 mg/dL — ABNORMAL HIGH (ref 70–99)
Potassium: 5.6 mmol/L — ABNORMAL HIGH (ref 3.5–5.1)
Sodium: 135 mmol/L (ref 135–145)
Total Bilirubin: 1 mg/dL (ref 0.3–1.2)
Total Protein: 5.5 g/dL — ABNORMAL LOW (ref 6.5–8.1)

## 2022-04-06 LAB — BASIC METABOLIC PANEL
Anion gap: 7 (ref 5–15)
BUN: 52 mg/dL — ABNORMAL HIGH (ref 8–23)
CO2: 23 mmol/L (ref 22–32)
Calcium: 7.7 mg/dL — ABNORMAL LOW (ref 8.9–10.3)
Chloride: 104 mmol/L (ref 98–111)
Creatinine, Ser: 0.73 mg/dL (ref 0.61–1.24)
GFR, Estimated: >60 mL/min (ref >60)
Glucose, Bld: 121 mg/dL — ABNORMAL HIGH (ref 70–99)
Potassium: 5.1 mmol/L (ref 3.5–5.1)
Sodium: 134 mmol/L — ABNORMAL LOW (ref 135–145)

## 2022-04-06 LAB — BLOOD GAS, VENOUS
Acid-base deficit: 1.1 mmol/L (ref 0.0–2.0)
Bicarbonate: 22.6 mmol/L (ref 20.0–28.0)
Drawn by: 164
O2 Saturation: 76.7 %
Patient temperature: 37
pCO2, Ven: 34 mmHg — ABNORMAL LOW (ref 44–60)
pH, Ven: 7.43 (ref 7.25–7.43)
pO2, Ven: 44 mmHg (ref 32–45)

## 2022-04-06 LAB — URINALYSIS, ROUTINE W REFLEX MICROSCOPIC
Bacteria, UA: NONE SEEN
Bilirubin Urine: NEGATIVE
Glucose, UA: NEGATIVE mg/dL
Ketones, ur: NEGATIVE mg/dL
Leukocytes,Ua: NEGATIVE
Nitrite: NEGATIVE
Protein, ur: NEGATIVE mg/dL
Specific Gravity, Urine: 1.027 (ref 1.005–1.030)
pH: 6 (ref 5.0–8.0)

## 2022-04-06 LAB — TECHNOLOGIST SMEAR REVIEW

## 2022-04-06 LAB — GLUCOSE, CAPILLARY
Glucose-Capillary: 109 mg/dL — ABNORMAL HIGH (ref 70–99)
Glucose-Capillary: 112 mg/dL — ABNORMAL HIGH (ref 70–99)
Glucose-Capillary: 166 mg/dL — ABNORMAL HIGH (ref 70–99)

## 2022-04-06 LAB — HEMOGLOBIN A1C
Hgb A1c MFr Bld: 5.3 % (ref 4.8–5.6)
Mean Plasma Glucose: 105.41 mg/dL

## 2022-04-06 LAB — TROPONIN I (HIGH SENSITIVITY): Troponin I (High Sensitivity): 11 ng/L (ref <18)

## 2022-04-06 LAB — PREPARE RBC (CROSSMATCH)

## 2022-04-06 LAB — MAGNESIUM: Magnesium: 2.3 mg/dL (ref 1.7–2.4)

## 2022-04-06 LAB — LACTIC ACID, PLASMA: Lactic Acid, Venous: 6.3 mmol/L (ref 0.5–1.9)

## 2022-04-06 LAB — FOLATE: Folate: 5.8 ng/mL — ABNORMAL LOW (ref >5.9)

## 2022-04-06 LAB — ABO/RH: ABO/RH(D): A POS

## 2022-04-06 LAB — VITAMIN B12: Vitamin B-12: 351 pg/mL (ref 180–914)

## 2022-04-06 SURGERY — ESOPHAGOGASTRODUODENOSCOPY (EGD)
Anesthesia: Moderate Sedation

## 2022-04-06 MED ORDER — PROPOFOL 1000 MG/100ML IV EMUL
0.0000 ug/kg/min | INTRAVENOUS | Status: DC
  Administered 2022-04-06: 10 ug/kg/min via INTRAVENOUS
  Administered 2022-04-07 (×2): 50 ug/kg/min via INTRAVENOUS
  Filled 2022-04-06 (×4): qty 100

## 2022-04-06 MED ORDER — EPINEPHRINE 1 MG/10ML IJ SOSY
PREFILLED_SYRINGE | INTRAMUSCULAR | Status: AC
  Filled 2022-04-06: qty 20

## 2022-04-06 MED ORDER — SODIUM BICARBONATE 8.4 % IV SOLN: 50.0000 meq | Freq: Once | INTRAVENOUS | Status: DC

## 2022-04-06 MED ORDER — FENTANYL CITRATE (PF) 100 MCG/2ML IJ SOLN
INTRAMUSCULAR | Status: AC
  Filled 2022-04-06: qty 4

## 2022-04-06 MED ORDER — PERFLUTREN LIPID MICROSPHERE
1.0000 mL | INTRAVENOUS | Status: AC | PRN
  Administered 2022-04-06: 2 mL via INTRAVENOUS

## 2022-04-06 MED ORDER — SORBITOL 70 % SOLN
960.0000 mL | TOPICAL_OIL | Freq: Once | ORAL | Status: DC
  Filled 2022-04-06: qty 240

## 2022-04-06 MED ORDER — CHLORHEXIDINE GLUCONATE CLOTH 2 % EX PADS
6.0000 | MEDICATED_PAD | Freq: Every day | CUTANEOUS | Status: DC
  Administered 2022-04-06 – 2022-04-09 (×4): 6 via TOPICAL

## 2022-04-06 MED ORDER — SODIUM CHLORIDE 0.9 % IV SOLN
250.0000 mL | INTRAVENOUS | Status: DC
  Administered 2022-04-08 (×2): 250 mL via INTRAVENOUS

## 2022-04-06 MED ORDER — MIDODRINE HCL 5 MG PO TABS
5.0000 mg | ORAL_TABLET | Freq: Three times a day (TID) | ORAL | Status: DC
  Administered 2022-04-06: 5 mg via ORAL
  Filled 2022-04-06: qty 1

## 2022-04-06 MED ORDER — SODIUM BICARBONATE 8.4 % IV SOLN
50.0000 meq | Freq: Once | INTRAVENOUS | Status: AC
  Administered 2022-04-06: 50 meq via INTRAVENOUS
  Filled 2022-04-06: qty 50

## 2022-04-06 MED ORDER — ORAL CARE MOUTH RINSE
15.0000 mL | OROMUCOSAL | Status: DC
  Administered 2022-04-06 – 2022-04-07 (×10): 15 mL via OROMUCOSAL

## 2022-04-06 MED ORDER — SODIUM CHLORIDE 0.9% IV SOLUTION: Freq: Once | INTRAVENOUS | Status: AC

## 2022-04-06 MED ORDER — DOCUSATE SODIUM 50 MG/5ML PO LIQD
100.0000 mg | Freq: Two times a day (BID) | ORAL | Status: DC
  Administered 2022-04-06 – 2022-04-07 (×2): 100 mg
  Filled 2022-04-06 (×2): qty 10

## 2022-04-06 MED ORDER — OXYCODONE HCL 5 MG PO TABS: 5.0000 mg | ORAL_TABLET | Freq: Four times a day (QID) | ORAL | Status: DC | PRN

## 2022-04-06 MED ORDER — HYDROXYZINE HCL 25 MG PO TABS
25.0000 mg | ORAL_TABLET | Freq: Once | ORAL | Status: AC
  Administered 2022-04-06: 25 mg via ORAL
  Filled 2022-04-06: qty 1

## 2022-04-06 MED ORDER — LIDOCAINE-EPINEPHRINE (PF) 2 %-1:200000 IJ SOLN
INTRAMUSCULAR | Status: AC
  Filled 2022-04-06: qty 20

## 2022-04-06 MED ORDER — DICLOFENAC SODIUM 1 % EX GEL
2.0000 g | Freq: Four times a day (QID) | CUTANEOUS | Status: DC
  Administered 2022-04-06 – 2022-04-12 (×23): 2 g via TOPICAL
  Filled 2022-04-06 (×3): qty 100

## 2022-04-06 MED ORDER — MIDAZOLAM HCL (PF) 5 MG/ML IJ SOLN
INTRAMUSCULAR | Status: AC
  Filled 2022-04-06: qty 2

## 2022-04-06 MED ORDER — SODIUM BICARBONATE 8.4 % IV SOLN
INTRAVENOUS | Status: AC
  Administered 2022-04-06: 50 meq
  Filled 2022-04-06: qty 50

## 2022-04-06 MED ORDER — PANTOPRAZOLE SODIUM 40 MG IV SOLR: 40.0000 mg | Freq: Two times a day (BID) | INTRAVENOUS | Status: DC

## 2022-04-06 MED ORDER — PANTOPRAZOLE 80MG IVPB - SIMPLE MED
80.0000 mg | Freq: Once | INTRAVENOUS | Status: AC
  Administered 2022-04-06: 80 mg via INTRAVENOUS
  Filled 2022-04-06: qty 100

## 2022-04-06 MED ORDER — BACLOFEN 10 MG PO TABS
20.0000 mg | ORAL_TABLET | Freq: Two times a day (BID) | ORAL | Status: DC | PRN
  Administered 2022-04-06: 20 mg via ORAL
  Filled 2022-04-06: qty 2

## 2022-04-06 MED ORDER — SENNA 8.6 MG PO TABS
1.0000 | ORAL_TABLET | Freq: Two times a day (BID) | ORAL | Status: DC
  Filled 2022-04-06: qty 1

## 2022-04-06 MED ORDER — ROCURONIUM BROMIDE 10 MG/ML (PF) SYRINGE
PREFILLED_SYRINGE | INTRAVENOUS | Status: AC
  Administered 2022-04-06: 100 mg
  Filled 2022-04-06: qty 10

## 2022-04-06 MED ORDER — DIPHENHYDRAMINE HCL 50 MG/ML IJ SOLN
INTRAMUSCULAR | Status: AC
  Filled 2022-04-06: qty 1

## 2022-04-06 MED ORDER — ORAL CARE MOUTH RINSE: 15.0000 mL | OROMUCOSAL | Status: DC | PRN

## 2022-04-06 MED ORDER — FENTANYL BOLUS VIA INFUSION: 25.0000 ug | INTRAVENOUS | Status: DC | PRN

## 2022-04-06 MED ORDER — ALPRAZOLAM 0.5 MG PO TABS: 0.5000 mg | ORAL_TABLET | Freq: Every day | ORAL | Status: DC | PRN

## 2022-04-06 MED ORDER — IOHEXOL 350 MG/ML SOLN
80.0000 mL | Freq: Once | INTRAVENOUS | Status: AC | PRN
  Administered 2022-04-06: 80 mL via INTRAVENOUS

## 2022-04-06 MED ORDER — ALPRAZOLAM 0.5 MG PO TABS
0.5000 mg | ORAL_TABLET | Freq: Two times a day (BID) | ORAL | Status: DC | PRN
  Administered 2022-04-06: 0.5 mg via ORAL
  Filled 2022-04-06: qty 1

## 2022-04-06 MED ORDER — NOREPINEPHRINE 4 MG/250ML-% IV SOLN: 0.0000 ug/min | INTRAVENOUS | Status: DC

## 2022-04-06 MED ORDER — SODIUM BICARBONATE 8.4 % IV SOLN
INTRAVENOUS | Status: AC
  Filled 2022-04-06: qty 50

## 2022-04-06 MED ORDER — METRONIDAZOLE 500 MG/100ML IV SOLN
500.0000 mg | Freq: Two times a day (BID) | INTRAVENOUS | Status: DC
  Administered 2022-04-07 – 2022-04-08 (×2): 500 mg via INTRAVENOUS
  Filled 2022-04-06 (×3): qty 100

## 2022-04-06 MED ORDER — LIDOCAINE-EPINEPHRINE (PF) 2 %-1:200000 IJ SOLN
1.7000 mL | Freq: Once | INTRAMUSCULAR | Status: AC
  Administered 2022-04-06: 1.7 mL via INTRADERMAL

## 2022-04-06 MED ORDER — LIDOCAINE HCL (PF) 1 % IJ SOLN
INTRAMUSCULAR | Status: AC
  Filled 2022-04-06: qty 30

## 2022-04-06 MED ORDER — FENTANYL CITRATE PF 50 MCG/ML IJ SOSY
PREFILLED_SYRINGE | INTRAMUSCULAR | Status: AC
  Administered 2022-04-06: 100 ug
  Filled 2022-04-06: qty 2

## 2022-04-06 MED ORDER — FENTANYL 2500MCG IN NS 250ML (10MCG/ML) PREMIX INFUSION
25.0000 ug/h | INTRAVENOUS | Status: DC
  Administered 2022-04-06: 50 ug/h via INTRAVENOUS
  Administered 2022-04-06: 125 ug/h via INTRAVENOUS
  Filled 2022-04-06: qty 250

## 2022-04-06 MED ORDER — POLYETHYLENE GLYCOL 3350 17 G PO PACK
17.0000 g | PACK | Freq: Every day | ORAL | Status: DC
  Administered 2022-04-07: 17 g
  Filled 2022-04-06: qty 1

## 2022-04-06 MED ORDER — INSULIN ASPART 100 UNIT/ML IJ SOLN
0.0000 [IU] | INTRAMUSCULAR | Status: DC
  Administered 2022-04-07: 1 [IU] via SUBCUTANEOUS

## 2022-04-06 MED ORDER — NOREPINEPHRINE 4 MG/250ML-% IV SOLN
2.0000 ug/min | INTRAVENOUS | Status: DC
  Administered 2022-04-06: 5 ug/min via INTRAVENOUS
  Filled 2022-04-06: qty 250

## 2022-04-06 MED ORDER — FENTANYL CITRATE PF 50 MCG/ML IJ SOSY: 25.0000 ug | PREFILLED_SYRINGE | Freq: Once | INTRAMUSCULAR | Status: DC

## 2022-04-06 MED ORDER — ACETAMINOPHEN 500 MG PO TABS: 1000.0000 mg | ORAL_TABLET | Freq: Three times a day (TID) | ORAL | Status: DC | PRN

## 2022-04-06 MED ORDER — PANTOPRAZOLE INFUSION (NEW) - SIMPLE MED
8.0000 mg/h | INTRAVENOUS | Status: DC
  Administered 2022-04-06 – 2022-04-07 (×2): 8 mg/h via INTRAVENOUS
  Filled 2022-04-06 (×2): qty 100

## 2022-04-06 MED ORDER — CALCIUM GLUCONATE-NACL 1-0.675 GM/50ML-% IV SOLN: 1.0000 g | Freq: Once | INTRAVENOUS | Status: DC

## 2022-04-06 MED ORDER — MIDAZOLAM HCL 2 MG/2ML IJ SOLN
INTRAMUSCULAR | Status: AC
  Administered 2022-04-06: 2 mg
  Filled 2022-04-06: qty 2

## 2022-04-06 MED ORDER — SENNOSIDES 8.8 MG/5ML PO SYRP
5.0000 mL | ORAL_SOLUTION | Freq: Two times a day (BID) | ORAL | Status: DC
  Administered 2022-04-06 – 2022-04-07 (×2): 5 mL
  Filled 2022-04-06 (×2): qty 5

## 2022-04-06 MED ORDER — CALCIUM GLUCONATE-NACL 1-0.675 GM/50ML-% IV SOLN
1.0000 g | Freq: Once | INTRAVENOUS | Status: AC
  Administered 2022-04-06: 1000 mg via INTRAVENOUS
  Filled 2022-04-06: qty 50

## 2022-04-06 MED ORDER — LIDOCAINE HCL (PF) 1 % IJ SOLN
INTRAMUSCULAR | Status: AC
  Filled 2022-04-06: qty 5

## 2022-04-06 MED ORDER — ACETAMINOPHEN 500 MG PO TABS
1000.0000 mg | ORAL_TABLET | Freq: Three times a day (TID) | ORAL | Status: DC | PRN
  Administered 2022-04-08 – 2022-04-11 (×2): 1000 mg via ORAL
  Filled 2022-04-06 (×2): qty 2

## 2022-04-06 MED ORDER — NOREPINEPHRINE 4 MG/250ML-% IV SOLN
0.0000 ug/min | INTRAVENOUS | Status: DC
  Administered 2022-04-06: 8 ug/min via INTRAVENOUS
  Administered 2022-04-06: 7 ug/min via INTRAVENOUS
  Administered 2022-04-07: 15 ug/min via INTRAVENOUS
  Administered 2022-04-07: 2 ug/min via INTRAVENOUS
  Administered 2022-04-08: 5 ug/min via INTRAVENOUS
  Filled 2022-04-06 (×4): qty 250

## 2022-04-06 MED ORDER — ETOMIDATE 2 MG/ML IV SOLN
INTRAVENOUS | Status: AC
  Administered 2022-04-06: 20 mg
  Filled 2022-04-06: qty 20

## 2022-04-06 MED ORDER — BISACODYL 10 MG RE SUPP
10.0000 mg | Freq: Once | RECTAL | Status: AC
  Administered 2022-04-06: 10 mg via RECTAL
  Filled 2022-04-06: qty 1

## 2022-04-06 NOTE — Progress Notes (Signed)
2 PM paged by RN that patient's blood pressure dropped to 55/28. And patient was minimally responsive.  Rapid response was called.  When evaluated at bedside, patient appeared diaphoretic and tachypneic.  He was awake and able to answer simple questions.  He denies any new chest pain, shortness of breath or abdominal pain.  He states that he just felt weak.  His heart rate was high but regular.  His abdomen was more distended and no bowel sound appreciated.  EKG showed sinus tachycardia without evidence of STEMI.  Recheck blood pressure confirmed that systolic in the 28B.  We were able to obtain 2 IV access and have 2 bags of fluid running.  His blood pressure initially improves to 151 systolic and quickly dropped to 119/10s.  Overall his blood pressure was labile with high systolic in the 761 and drops to 60s.  Differential for his hypotension including bowel perforation from constipation, PE, ACS, septic shock.  Patient received a dose of home 0.5 Xanax at 12 PM but it should not cause a significant drop in blood pressure like this.  PCCM was consulted.  Dr. Erskine Emery came to bedside and recommended ICU transfer.  Ordered CTA PE, CT abdomen/pelvis with contrast and CT right knee.  His antibiotics was broadened to Vanco and cefepime and metronidazole.  Obtained CBC, CMP, ABG, Mag, lactic acid, troponin and blood culture.

## 2022-04-06 NOTE — Progress Notes (Signed)
HD#1 Subjective:  Overnight Events: No event  Patient appears comfortable at bedside.  He said that he is doing better.  Patient is alert and oriented to person and his birthdate.  Still quite confused about time and place.  He has not had a BMP in 2 weeks.  Patient reports coughing that started yesterday but no congestion no sore throat.  Denies shortness of breath or chest pain.  Patient states that he had his knee surgery recently.  In the last couple days he has been taking more pain medications.  His wife helps him with medication so he is not sure what he is taking at home.  I spoke to patient's wife on the phone.  She states that he usually takes 2 tablets of oxycodone and 2-3 Xanax before the knee surgery.  At [facility], he takes the same amount of oxycodone and no Xanax.  Objective:  Vital signs in last 24 hours: Vitals:   04/05/22 2358 04/06/22 0138 04/06/22 0315 04/06/22 0735  BP:  103/70 138/78 118/78  Pulse: (!) 110 (!) 125 (!) 123 (!) 125  Resp:  (!) 22 (!) 22 20  Temp:  98.4 F (36.9 C) 99.1 F (37.3 C) 99.8 F (37.7 C)  TempSrc:  Oral Oral Oral  SpO2:  98% 100% 97%   Supplemental O2: Room Air SpO2: 97 %   Physical Exam:  Physical Exam Constitutional:      General: He is not in acute distress.    Comments: AO x person and birthday  HENT:     Head: Normocephalic.  Eyes:     General:        Right eye: No discharge.        Left eye: No discharge.     Conjunctiva/sclera: Conjunctivae normal.  Cardiovascular:     Rate and Rhythm: Regular rhythm. Tachycardia present.  Pulmonary:     Effort: Pulmonary effort is normal. No respiratory distress.     Breath sounds: Normal breath sounds. No wheezing.     Comments: Limited exam given patient could not sit up Abdominal:     General: Bowel sounds are normal. There is distension (Mild distention).  Musculoskeletal:     Comments: Recent right knee surgery bandage.  Warm to palpation.  No hematoma or bruising noted.   Neurological:     Mental Status: He is alert.     Comments: No cranial nerves deficit EOM intact Pupil reactive to light bilat 5/5 strength and ROM of bilat UE 5/5 strength bilat LE  Psychiatric:        Mood and Affect: Mood normal.     There were no vitals filed for this visit.   Intake/Output Summary (Last 24 hours) at 04/06/2022 1019 Last data filed at 04/06/2022 0440 Gross per 24 hour  Intake 3150 ml  Output 3000 ml  Net 150 ml   Net IO Since Admission: 150 mL [04/06/22 1019]  Pertinent Labs:    Latest Ref Rng & Units 04/06/2022    7:46 AM 04/05/2022    6:39 PM 04/05/2022    9:48 AM  CBC  WBC 4.0 - 10.5 K/uL 19.3  14.7    Hemoglobin 13.0 - 17.0 g/dL 10.0  12.9  13.3   Hematocrit 39.0 - 52.0 % 30.1  39.6  39.0   Platelets 150 - 400 K/uL 348  281         Latest Ref Rng & Units 04/06/2022    7:46 AM 04/05/2022  9:48 AM 04/05/2022    9:40 AM  CMP  Glucose 70 - 99 mg/dL 121  105  110   BUN 8 - 23 mg/dL 52  38  37   Creatinine 0.61 - 1.24 mg/dL 0.73  1.60  1.47   Sodium 135 - 145 mmol/L 134  130  133   Potassium 3.5 - 5.1 mmol/L 5.1  5.2  5.4   Chloride 98 - 111 mmol/L 104  100  100   CO2 22 - 32 mmol/L 23   26   Calcium 8.9 - 10.3 mg/dL 7.7   7.9   Total Protein 6.5 - 8.1 g/dL   6.1   Total Bilirubin 0.3 - 1.2 mg/dL   0.7   Alkaline Phos 38 - 126 U/L   95   AST 15 - 41 U/L   29   ALT 0 - 44 U/L   44     Imaging: US RENAL  Result Date: 04/05/2022 CLINICAL DATA:  AKI EXAM: RENAL / URINARY TRACT ULTRASOUND COMPLETE COMPARISON:  Lumbar spine CT 06/30/2019, right upper quadrant ultrasound 04/02/2009 FINDINGS: Right Kidney: Renal measurements: 12.0 cm x 5.0 cm x 7.0 cm = volume: 243 mL. Echogenicity within normal limits. No mass or hydronephrosis visualized. Left Kidney: Renal measurements: 12.6 cm x 7.1 cm x 6.9 cm = volume: 326 mL. Echogenicity within normal limits. No mass or hydronephrosis visualized. Bladder: Not identified. Other: None. IMPRESSION:  Normal appearance of the kidneys. Electronically Signed   By: Valetta Mole M.D.   On: 04/05/2022 16:36    Assessment/Plan:   Principal Problem:   Acute encephalopathy Active Problems:   Degeneration of lumbar or lumbosacral intervertebral disc   Hypertension   Constipation   Chronic pain syndrome   Urinary retention   Arthritis of knee   Patient Summary: Randall Mccormick is a 66 y.o. 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   Acute toxic encephalopathy - improving Chronic pain syndrome Patient's altered mental status likely from being on many centrally acting medications.  His constipation and urinary retention support that, which also can be contributing.  His reduced kidney function can lead to decreased clearance of opioid and baclofen.  CT head was negative x 2, low suspicion for CVA.  No evidence of seizure on EEG.  Pending infection work-up including blood culture and respiratory panel. Right knee X-ray neg for acute pathology.  -Vancomycin and cefepime day 2, follow BC.  -Resume low-dose oxycodone and Xanax to avoid withdrawal.  Stop scheduled Tylenol to watch for fever trend. -Pending respiratory panel -There is maybe a component of hospital delirium this morning in the setting of possible dementia. Delirium precaution in place.    Acute kidney injury - resolved  Hypovolemic hyponatremia -improving Urinary retention His AKI may be a component of prerenal and urinary retention in the setting of opioid use, which has resolved with IV fluid and Foley placement  -Foley care.  Attempt voiding trial tomorrow. -avoid nephrotoxic medications -daily bmp -encourage PO fluids -Continue Flomax  Constipation Per wife  patient has not had a BM in 2 weeks despite senna at home. Likely in setting of opioids and recent surgery.  No BM reported yesterday.  We will give suppository and increase dose of senna.   OA of right knee s/p total  knee replacement 03/28/22 Patient at [facility] operatively for SNF. Supposed to have follow up with Dr. Marlou Sa next week. Bandage in place until then. Will place PT referral while in the hospital as may need CPM while in bed.  -Resume low-dose oxycodone and baclofen for pain control. -PT/OT evaluation -Hold aspirin 81 mg BID for DVT prophylaxis, lovenox while admitted  Depression/Anxiety Home medications of prozac and zyprexa.  -resume zyprexa 20 mg qhs    Macrocytic anemia B12 WNL Smear unremarkable   Hypertension Hold lisinopril given low normal BP   Hyperlipidemia  Continue zetia   Hx of goute Continue home allopurinol 300 mg daily  Diet: Heart Healthy IVF: None,None VTE: Enoxaparin Code: Full PT/OT recs: pending  TOC recs:   Dispo: Anticipated discharge to Skilled nursing facility in 2 days pending improvement of mental status.   Gaylan Gerold, DO 04/06/2022, 10:19 AM Pager: [phone]  Please contact the on call pager after 5 pm and on weekends at [phone].

## 2022-04-06 NOTE — Plan of Care (Signed)
Unable to obtain MRI brain due to metal fragment in the eye Repeat head CT negative for acute intracranial process EEG negative for epileptogenic activity  Neurology will be available as an as-needed basis going forward, reach out if any questions or concerns arise  Lesleigh Noe MD-PhD [facility] [phone] Available 7 AM to 7 PM, outside these hours please contact Neurologist on call listed on AMION

## 2022-04-06 NOTE — Procedures (Signed)
Arterial Catheter Insertion Procedure Note  Sender Rueb  [id number]  [birth year]  Date:04/06/22  Time:6:49 PM    Provider Performing: Julian Hy    Procedure: Insertion of Arterial Line [phone]) with US guidance ([id number])   Indication(s) Blood pressure monitoring and/or need for frequent ABGs  Consent Risks of the procedure as well as the alternatives and risks of each were explained to the patient and/or caregiver.  Consent for the procedure was obtained and is signed in the bedside chart  Anesthesia 1% lidocaine   Time Out Verified patient identification, verified procedure, site/side was marked, verified correct patient position, special equipment/implants available, medications/allergies/relevant history reviewed, required imaging and test results available.   Sterile Technique Maximal sterile technique including full sterile barrier drape, hand hygiene, sterile gown, sterile gloves, mask, hair covering, sterile ultrasound probe cover (if used).   Procedure Description Area of catheter insertion was cleaned with chlorhexidine and draped in sterile fashion. With real-time ultrasound guidance an arterial catheter was placed into the left radial artery. Although pulsatile blood returned, the guidewire was unable to be threaded. 2 attempts and then aborted.  Complications/Tolerance Unsuccessful placement   EBL Minimal   Specimen(s) None  Julian Hy, DO 04/06/22 6:50 PM [facility]

## 2022-04-06 NOTE — Significant Event (Signed)
Rapid Response Event Note   Reason for Call :  Decreased LOC  Initial Focused Assessment:  Patient is minimally responsive to painful stimuli, pale, cold and diaphoretic.   Lung sounds clear, Heart tones regular.  BP 65/17  ST 120s, RR 40s O2 sat 97% on RA   Interventions:  Placed on NRB 2nd PIV placed left AC, labs drawn Patient awoke after receiving NRB and about 500cc NS bolus, he remained hypotensive and cold and clammy. 4L NS bolus, via pressure bag Levophed started for hypotension started at 50mg titrated up to 190m. Weaned back to 25m42mupon arrival to ICU, patient alert, cool but dry. CT chest, abdomen, pelvis and right knee done   Transferred to ICU   Plan of Care:     Event Summary:   MD Notified: NguAlfonse Sprucell Time: 135[place]: 1359 End Time: 163[place] Ip[place]
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
## 2022-04-06 NOTE — Progress Notes (Signed)
I was going to obtain ABG however per discussion w/ Dr Tamala Julian, per MD hold off obtaining ABG until pt comes to ICU d/t pt going to CT presently.

## 2022-04-06 NOTE — Procedures (Signed)
Intubation Procedure Note  Norwood Quezada  [id number]  1956-01-05  Date:04/06/22  Time:6:14 PM   Provider Performing:Cornesha Radziewicz Naomie Dean    Procedure: Intubation (31500)  Indication(s) Respiratory Failure  Consent Risks of the procedure as well as the alternatives and risks of each were explained to the patient and/or caregiver.  Consent for the procedure was obtained and is signed in the bedside chart   Anesthesia Etomidate, Fentanyl, and Rocuronium   Time Out Verified patient identification, verified procedure, site/side was marked, verified correct patient position, special equipment/implants available, medications/allergies/relevant history reviewed, required imaging and test results available.   Sterile Technique Usual hand hygeine, masks, and gloves were used   Procedure Description Patient positioned in bed supine.  Sedation given as noted above.  Patient was intubated with endotracheal tube using Glidescope.  View was Grade 1 full glottis .  Number of attempts was 1.  Colorimetric CO2 detector was consistent with tracheal placement.   Complications/Tolerance None; patient tolerated the procedure well. Chest X-ray is ordered to verify placement.   EBL Minimal   Specimen(s) None  Julian Hy, DO 04/06/22 6:15 PM [facility]

## 2022-04-06 NOTE — Evaluation (Signed)
Physical Therapy Evaluation Patient Details Name: Randall Mccormick MRN: [id number] DOB: 06/01/1956 Today's Date: 04/06/2022  History of Present Illness  66 y/o person with recent right total knee arthroplasty on 03/28/22 who presented 04/05/22 to the ED from [place] center after having somnolence, encephalopathy, dysarthria, and lower extremity weakness. Head CT negative; EEG negativve;  Pt has PMHx:  urinary retention, incomplete paraplegia, spasticity, wheelchair dependence, thoracic spondylosis, neurogenic bladder, neuropathic pain, chronic pain, anxiety, gout, HTN, L rib fractures  Clinical Impression   Pt admitted secondary to problem above with deficits below. PTA patient was receiving therapy at SNF due to recent Rt TKA.  Pt currently very tachycardic with cold, clammy sweat and only able to participate in limited PT evaluation. RN aware and reports MD also aware. Rt knee assessed and moving well with ~100 degrees of flexion and no pillow under knee. As pt medically improves, anticipate patient will benefit from PT to address problems listed below. Will continue to follow acutely to maximize functional mobility independence and safety.          Recommendations for follow up therapy are one component of a multi-disciplinary discharge planning process, led by the attending physician.  Recommendations may be updated based on patient status, additional functional criteria and insurance authorization.  Follow Up Recommendations Skilled nursing-short term rehab (<3 hours/day) Can patient physically be transported by private vehicle: No    Assistance Recommended at Discharge Frequent or constant Supervision/Assistance  Patient can return home with the following  Assistance with cooking/housework;Assist for transportation;Help with stairs or ramp for entrance;Two people to help with walking and/or transfers;Assistance with feeding;Direct supervision/assist for medications management;Direct  supervision/assist for financial management    Equipment Recommendations None recommended by PT  Recommendations for Other Services       Functional Status Assessment Patient has had a recent decline in their functional status and demonstrates the ability to make significant improvements in function in a reasonable and predictable amount of time.     Precautions / Restrictions Precautions Precautions: Fall Precaution Comments: total knee precautions Restrictions Weight Bearing Restrictions: Yes RLE Weight Bearing: Weight bearing as tolerated      Mobility  Bed Mobility               General bed mobility comments: deferred due to incr HR 139 bpm with pt sweating profusely and feeling clammy    Transfers                        Ambulation/Gait                  Stairs            Wheelchair Mobility    Modified Rankin (Stroke Patients Only)       Balance                                             Pertinent Vitals/Pain Pain Assessment Pain Assessment: Faces Faces Pain Scale: Hurts whole lot Pain Location: R knee Pain Descriptors / Indicators: Grimacing, Guarding Pain Intervention(s): Limited activity within patient's tolerance, Monitored during session, Repositioned    Home Living Family/patient expects to be discharged to:: Skilled nursing facility Living Arrangements: Spouse/significant other Available Help at Discharge: Family;Available 24 hours/day Type of Home: House Home Access: Ramped entrance       Home Layout: One  level Home Equipment: Conservation officer, nature (2 wheels);Rollator (4 wheels);Cane - single point;Shower seat;BSC/3in1;Grab bars - tub/shower;Grab bars - toilet;Wheelchair - manual      Prior Function Prior Level of Function : Needs assist       Physical Assist : Mobility (physical) Mobility (physical): Gait;Transfers   Mobility Comments: per previous medical record as pt not able to report: RW  with wife assisting to support walk and transfers       Hand Dominance   Dominant Hand: Right    Extremity/Trunk Assessment   Upper Extremity Assessment Upper Extremity Assessment: Defer to OT evaluation    Lower Extremity Assessment Lower Extremity Assessment: Generalized weakness;RLE deficits/detail;LLE deficits/detail RLE Deficits / Details: stiffness and pain to flex R knee AAROM ~100 degrees RLE Coordination: decreased gross motor LLE Deficits / Details: AROM WFL except ankle DF limited (2+)    Cervical / Trunk Assessment Cervical / Trunk Assessment:  (mild, old spinal surgery)  Communication   Communication: No difficulties  Cognition Arousal/Alertness: Awake/alert Behavior During Therapy: WFL for tasks assessed/performed Overall Cognitive Status: Impaired/Different from baseline Area of Impairment: Orientation, Attention, Memory, Following commands, Awareness, Problem solving                 Orientation Level: Time, Situation, Place Current Attention Level: Sustained Memory: Decreased recall of precautions Following Commands: Follows one step commands consistently, Follows one step commands with increased time   Awareness: Intellectual Problem Solving: Slow processing, Decreased initiation, Requires verbal cues, Requires tactile cues General Comments: unable to answer basic questions re: his prior functional status (before going to [facility]--or even what he has been doing at [facility])        General Comments General comments (skin integrity, edema, etc.): RN present on arrival and despite dyspnea felt it was OK for therapies to proceed with evaluation. As noted HR up to 139 bpm at rest, limited evaluation completed.    Exercises     Assessment/Plan    PT Assessment Patient needs continued PT services  PT Problem List Decreased strength;Decreased range of motion;Decreased activity tolerance;Decreased balance;Decreased mobility;Decreased  coordination;Decreased cognition;Decreased knowledge of use of DME;Decreased safety awareness;Cardiopulmonary status limiting activity;Decreased skin integrity;Pain;Decreased knowledge of precautions;Obesity       PT Treatment Interventions DME instruction;Gait training;Stair training;Functional mobility training;Therapeutic activities;Therapeutic exercise;Balance training;Neuromuscular re-education;Patient/family education;Cognitive remediation    PT Goals (Current goals can be found in the Care Plan section)  Acute Rehab PT Goals Patient Stated Goal: to get home sooner PT Goal Formulation: With patient/family Time For Goal Achievement: 04/20/22 Potential to Achieve Goals: Fair    Frequency Min 3X/week     Co-evaluation               AM-PAC PT "6 Clicks" Mobility  Outcome Measure Help needed turning from your back to your side while in a flat bed without using bedrails?: A Lot Help needed moving from lying on your back to sitting on the side of a flat bed without using bedrails?: Total Help needed moving to and from a bed to a chair (including a wheelchair)?: Total Help needed standing up from a chair using your arms (e.g., wheelchair or bedside chair)?: Total Help needed to walk in hospital room?: Total Help needed climbing 3-5 steps with a railing? : Total 6 Click Score: 7    End of Session   Activity Tolerance: Treatment limited secondary to medical complications (Comment) (resting HR 139, sweating and clammy) Patient left: in bed;with call bell/phone within reach;with bed alarm set;with nursing/sitter in  room Nurse Communication: Other (comment) (PT very limited due to elevated HR, cold and clammy) PT Visit Diagnosis: Unsteadiness on feet (R26.81);Muscle weakness (generalized) (M62.81);Pain Pain - Right/Left: Right Pain - part of body: Knee    Time: 1153-1207 PT Time Calculation (min) (ACUTE ONLY): 14 min   Charges:   PT Evaluation $PT Eval Low Complexity: [place], PT Acute Rehabilitation Services  Office [phone]   Rexanne Mano 04/06/2022, 12:49 PM

## 2022-04-06 NOTE — Hospital Course (Signed)
  10-18: Patient reports feeling fine this morning. He oriented to person, place, month, and president but thought that the year was 2013. Explained reason for hospitalization, recent pneumonia, and return from the intensive care unit. Discussed plan for physical therapy to evaluate and determine discharge location.

## 2022-04-06 NOTE — Consult Note (Signed)
[facility] Consult Note   History Randall Mccormick MRN # [id number]  Date of Admission: 04/05/2022 Date of Consultation: 04/06/2022 Referring physician: Dr. Candee Furbish, MD Primary Care Provider: [name], MD Primary Gastroenterologist: None   Reason for Consultation/Chief Complaint: Upper GI bleed  Subjective  HPI:  66 year old man with multiple medical problems including a recent right TKR admitted yesterday with altered mental status, neurologic symptoms and concern for CVA.  No acute stroke on CT scan.  Throughout the day today he has become progressively obtunded, diaphoretic with rigors and persistent hypotension requiring pressors, then was seen by critical care consult.  He was intubated for airway protection, and he is continue to be markedly hypotensive requiring pressors, he is having a central venous catheter and A-line placed now. There has been concern for septic shock, pan CT scan did not show any thrombosis, obvious infectious source, urine study negative as well.  CT abdomen with no retroperitoneal bleed.  Radiologist did not make this, particularly, but his stomach is dilated with a lot of fluid. An OG tube was placed after intubation, about 250 cc of coffee-ground material is coming from it. Hemoglobin was 12.9 on admission last evening, 10 this morning, 9.6 early this afternoon, then 6.8 on a recheck at 7 PM after his worsened decompensation.  Hemoglobin still 6.8 on the second check.  PRBCs being transfused now.  Nursing reports he has not passed melena or bright red blood per rectum.   ROS:  Patient unable to give any Hx or ROS since hs is intubated and sedated  Past Medical History Past Medical History:  Diagnosis Date   Anxiety    Cobalamin deficiency    Degeneration of lumbar or lumbosacral intervertebral disc    Depression    Gouty arthropathy, unspecified    Hemorrhoids    Hyperlipidemia    Hypertension    Multiple fractures of  ribs of left side 04/20/2013   FALL FROM A LADDER ON    Vitamin D deficiency     Past Surgical History Past Surgical History:  Procedure Laterality Date   APPENDECTOMY     BACK SURGERY     x 6   CHOLECYSTECTOMY  06/24/2008   [facility]   TONSILLECTOMY     TOTAL KNEE ARTHROPLASTY Right 03/28/2022   Procedure: RIGHT TOTAL KNEE ARTHROPLASTY;  Surgeon: Meredith Pel, MD;  Location: [place];  Service: Orthopedics;  Laterality: Right;    Family History Family History  Problem Relation Age of Onset   Cancer Mother        BREAST   Heart disease Father    Alcoholism Father    Cancer Son        LEUKEMIA    Social History Social History   Socioeconomic History   Marital status: Married    Spouse name: [name]   Number of children: 1   Years of education: Not on file   Highest education level: Not on file  Occupational History   Not on file  Tobacco Use   Smoking status: Never   Smokeless tobacco: Former    Types: Chew    Quit date: 2022  Vaping Use   Vaping Use: Never used  Substance and Sexual Activity   Alcohol use: Yes    Comment: 6-8 BEERS A NIGHT   Drug use: Not Currently   Sexual activity: Not Currently  Other Topics Concern   Not on file  Social History Narrative   Not on file  Social Determinants of Health   Financial Resource Strain: Not on file  Food Insecurity: No Food Insecurity (03/29/2022)   Hunger Vital Sign    Worried About Running Out of Food in the Last Year: Never true    Ran Out of Food in the Last Year: Never true  Transportation Needs: No Transportation Needs (03/29/2022)   PRAPARE - Hydrologist (Medical): No    Lack of Transportation (Non-Medical): No  Physical Activity: Not on file  Stress: Not on file  Social Connections: Not on file   I spoke to his wife, and she confirmed he has typically 3-4 mixed drinks per night, in the past consumed a heavy amount of beer.  He has also been using aspirin and  NSAIDs regularly.  Allergies No Known Allergies  Outpatient Meds Home medications from the [facility]+P and/or nursing med reconciliation reviewed.  Inpatient med list reviewed  _____________________________________________________________________ Objective   Exam:  Current vital signs  Patient Vitals for the past 8 hrs:  BP Temp Temp src Pulse Resp SpO2  04/06/22 1830 (!) 148/105 97.9 F (36.6 C) Axillary (!) 128 (!) 34 100 %  04/06/22 1800 101/77 -- -- (!) 122 (!) 30 100 %  04/06/22 1745 103/74 -- -- (!) 129 (!) 30 100 %  04/06/22 1730 112/63 -- -- (!) 124 (!) 23 100 %  04/06/22 1715 118/81 -- -- (!) 132 20 98 %  04/06/22 1700 120/71 -- -- (!) 128 19 100 %  04/06/22 1645 -- -- -- -- -- 100 %  04/06/22 1630 (!) 140/93 -- -- (!) 122 (!) 21 100 %  04/06/22 1621 (!) 138/45 97.8 F (36.6 C) Axillary -- -- --  04/06/22 1620 -- -- -- (!) 123 (!) 21 100 %  04/06/22 1615 123/72 -- -- (!) 122 (!) 21 93 %  04/06/22 1610 117/74 -- -- (!) 122 -- 100 %  04/06/22 1515 (!) 75/55 -- -- (!) 109 -- 95 %  04/06/22 1510 (!) 76/45 -- -- (!) 113 -- 100 %  04/06/22 1506 (!) 71/52 -- -- (!) 118 -- 100 %  04/06/22 1501 (!) 66/45 -- -- (!) 120 (!) 42 100 %  04/06/22 1458 (!) 66/44 -- -- (!) 125 -- 100 %  04/06/22 1433 (!) 145/45 -- -- (!) 41 -- --  04/06/22 1430 (!) 111/16 -- -- (!) 41 -- 100 %  04/06/22 1425 (!) 120/59 -- -- (!) 44 (!) 40 100 %  04/06/22 1422 (!) 83/43 -- -- (!) 134 -- 100 %  04/06/22 1420 (!) 80/44 -- -- (!) 127 -- 100 %  04/06/22 1414 (!) 58/26 -- -- (!) 126 -- 98 %  04/06/22 1411 (!) 65/17 -- -- -- -- --  04/06/22 1407 (!) 65/17 -- -- (!) 128 (!) 42 99 %  04/06/22 1401 100/72 -- -- (!) 47 -- 97 %  04/06/22 1119 109/68 98.2 F (36.8 C) Oral (!) 134 (!) 22 96 %    Intake/Output Summary (Last 24 hours) at 04/06/2022 1900 Last data filed at 04/06/2022 1600 Gross per 24 hour  Intake 650 ml  Output 2350 ml  Net -1700 ml    Physical Exam:   General: this is a acute and  chronically ill male patient intubated and sedated. Eyes: sclera anicteric, no redness ET tube and OG tube in place CV: Tachycardic, no peripheral edema Resp: clear to auscultation bilaterally, normal RR and effort noted GI: soft, obese, not distended and tympanitic Skin;  dry, pale, poor capillary refill Neuro: Sedated on vent  Labs:     Latest Ref Rng & Units 04/06/2022    6:05 PM 04/06/2022    4:53 PM 04/06/2022    2:33 PM  CBC  WBC 4.0 - 10.5 K/uL   24.7   Hemoglobin 13.0 - 17.0 g/dL 6.8  6.8  9.6   Hematocrit 39.0 - 52.0 % 20.0  20.0  29.8   Platelets 150 - 400 K/uL   476        Latest Ref Rng & Units 04/06/2022    6:05 PM 04/06/2022    4:53 PM 04/06/2022    2:33 PM  CMP  Glucose 70 - 99 mg/dL   197   BUN 8 - 23 mg/dL   71   Creatinine 0.61 - 1.24 mg/dL   1.23   Sodium 135 - 145 mmol/L 143  137  135   Potassium 3.5 - 5.1 mmol/L 4.5  4.5  5.6   Chloride 98 - 111 mmol/L   105   CO2 22 - 32 mmol/L   16   Calcium 8.9 - 10.3 mg/dL   7.9   Total Protein 6.5 - 8.1 g/dL   5.5   Total Bilirubin 0.3 - 1.2 mg/dL   1.0   Alkaline Phos 38 - 126 U/L   83   AST 15 - 41 U/L   53   ALT 0 - 44 U/L   51    This morning, BUN 71, creatinine 1.2, bicarb 16, potassium 5.6  Recent Labs  Lab 04/05/22 0940  INR 1.2   _________________________________________________________ Radiologic studies:  CT chest on and pelvis report on file.  Images personally reviewed.  No evidence of cirrhosis or portal hypertension. ______________________________________________________ Other studies:  Bedside echo done by critical care physician, no reported tamponade. _______________________________________________________ Assessment & Plan  Impression: Shock, source unclear but possibly from GI bleeding.  He has had a relatively small amount of coffee-ground material so far, but looking at the CT scan, he may have quite a bit more fluid or blood in the stomach, and this may be why he is not  passing any blood per rectum.  Severe anemia of acute illness and GI blood loss.  This may yet be a septic source of shock with secondary upper GI bleeding.  We need to answer the question one way or the other with an endoscopic evaluation. Plan:  He is receiving all fluid and pressor support as well as transfusion of PRBCs and IV Protonix.  Upper endoscopy tonight.   His wife was agreeable after discussion of procedure and risks.  The benefits and risks of the planned procedure were described in detail with the patient or (when appropriate) their health care proxy.  Risks were outlined as including, but not limited to, bleeding, infection, perforation, adverse medication reaction leading to cardiac or pulmonary decompensation, pancreatitis (if ERCP).  The limitation of incomplete mucosal visualization was also discussed.  No guarantees or warranties were given. Patient at increased risk for cardiopulmonary complications of procedure due to medical comorbidities.    Thank you for the courtesy of this consult.  Please contact me with any questions or concerns.  Nelida Meuse III Office: [phone]

## 2022-04-06 NOTE — Progress Notes (Signed)
EGD done at bedside in 3M03. ICU RN at bedside throughout. Vital signs and sedation managed by ICU RN.

## 2022-04-06 NOTE — Evaluation (Signed)
Occupational Therapy Evaluation Patient Details Name: Randall Mccormick MRN: [id number] DOB: 1956-03-08 Today's Date: 04/06/2022   History of Present Illness 66 y/o person with recent right total knee arthroplasty on 03/28/22 who presented 04/05/22 to the ED from [place] [facility] after having somnolence, encephalopathy, dysarthria, and lower extremity weakness. Head CT negative; EEG negativve;  Pt has PMHx:  urinary retention, incomplete paraplegia, spasticity, wheelchair dependence, thoracic spondylosis, neurogenic bladder, neuropathic pain, chronic pain, anxiety, gout, HTN, L rib fractures   Clinical Impression   Pt admitted from [facility] for rehab with somnolence, encephalopathy, LE weakness, and dysarthria. PTA, pt was at [facility] for therapy due to recent R TKA. Upon eval, pt tachycardic, cold to the touch, and saturating diaphoresis ~5 minutes into eval prior to any movement. RN present reporting MD aware of pt status. Pt currently not oriented and difficulty following full conversation, but pleasant. Using BUE at bed level [facility] for grooming/eating tasks. Deferring additional mobility for pt safety. OT will continue to follow acutely to address physical and cognitive concerns. Recommending SNF for discharge at this time.      Recommendations for follow up therapy are one component of a multi-disciplinary discharge planning process, led by the attending physician.  Recommendations may be updated based on patient status, additional functional criteria and insurance authorization.   Follow Up Recommendations  Skilled nursing-short term rehab (<3 hours/day)    Assistance Recommended at Discharge Frequent or constant Supervision/Assistance  Patient can return home with the following A lot of help with walking and/or transfers;A lot of help with bathing/dressing/bathroom;Assistance with cooking/housework;Direct supervision/assist for medications management;Assist for transportation;Direct  supervision/assist for financial management;Help with stairs or ramp for entrance    Functional Status Assessment  Patient has had a recent decline in their functional status and demonstrates the ability to make significant improvements in function in a reasonable and predictable amount of time.  Equipment Recommendations  Other (comment) (TBD next venue)    Recommendations for Other Services       Precautions / Restrictions Precautions Precautions: Fall Precaution Comments: total knee precautions Restrictions Weight Bearing Restrictions: Yes RLE Weight Bearing: Weight bearing as tolerated      Mobility Bed Mobility               General bed mobility comments: deferred due to incr HR 139 bpm with pt sweating profusely and feeling clammy    Transfers                   General transfer comment: deferred due to incr HR 139 bpm with pt sweating profusely and feeling clammy while supine      Balance                                           ADL either performed or assessed with clinical judgement   ADL Overall ADL's : Needs assistance/impaired Eating/Feeding: Set up;Bed level   Grooming: Wash/dry face;Bed level;Set up                                 General ADL Comments: Dependent for all additional ADL at this time due to limited evaluation as pt cool and diaphoretic with HR 139; weak and thready upon palpation. Deferring further mobility for safety. Anticipate pt will benefit from return to SNF for continued therapies  after acute needs are met     Vision Baseline Vision/History: 0 No visual deficits Ability to See in Adequate Light: 0 Adequate Patient Visual Report: No change from baseline Vision Assessment?: No apparent visual deficits Additional Comments: WFL for tasks assessed. Per chart, metal fragment in R eye from fishing accident in childhood     Perception     Praxis      Pertinent Vitals/Pain Pain  Assessment Pain Assessment: Faces Faces Pain Scale: Hurts whole lot Pain Location: R knee Pain Descriptors / Indicators: Grimacing, Guarding Pain Intervention(s): Limited activity within patient's tolerance, Monitored during session, Repositioned, Relaxation     Hand Dominance Right   Extremity/Trunk Assessment Upper Extremity Assessment Upper Extremity Assessment: Generalized weakness (R handed. RUE 4+/5, LUE 4/5)   Lower Extremity Assessment Lower Extremity Assessment: Defer to PT evaluation RLE Deficits / Details: stiffness and pain to flex R knee AAROM ~100 degrees RLE Coordination: decreased gross motor LLE Deficits / Details: AROM WFL except ankle DF limited (2+)   Cervical / Trunk Assessment Cervical / Trunk Assessment: Kyphotic (mild, old spinal surgery)   Communication Communication Communication: No difficulties   Cognition Arousal/Alertness: Awake/alert Behavior During Therapy: WFL for tasks assessed/performed Overall Cognitive Status: Impaired/Different from baseline Area of Impairment: Orientation, Attention, Memory, Following commands, Awareness, Problem solving                 Orientation Level: Time, Situation, Place Current Attention Level: Sustained Memory: Decreased recall of precautions Following Commands: Follows one step commands consistently, Follows one step commands with increased time   Awareness: Intellectual Problem Solving: Slow processing, Decreased initiation, Requires verbal cues, Requires tactile cues General Comments: unable to answer basic questions re: his prior functional status (before going to [facility]--or even what he has been doing at [facility])     General Comments  RN present on arrival and despite dyspnea felt it was OK for therapies to proceed with evaluation. As noted HR up to 139 bpm at rest, limited evaluation completed.    Exercises     Shoulder Instructions      Home Living Family/patient expects to be discharged  to:: Skilled nursing facility Living Arrangements: Spouse/significant other Available Help at Discharge: Family;Available 24 hours/day Type of Home: House Home Access: Ramped entrance     Home Layout: One level         Bathroom Toilet: Handicapped height     Home Equipment: Conservation officer, nature (2 wheels);Rollator (4 wheels);Cane - single point;Shower seat;BSC/3in1;Grab bars - tub/shower;Grab bars - toilet;Wheelchair - manual          Prior Functioning/Environment Prior Level of Function : Needs assist       Physical Assist : Mobility (physical) Mobility (physical): Gait;Transfers   Mobility Comments: per previous medical record as pt not able to report: RW with wife assisting to support walk and transfers ADLs Comments: Pt unable to report.        OT Problem List: Decreased strength;Decreased activity tolerance;Impaired balance (sitting and/or standing);Decreased cognition;Decreased safety awareness;Decreased knowledge of precautions;Pain;Cardiopulmonary status limiting activity      OT Treatment/Interventions: Self-care/ADL training;Therapeutic exercise;DME and/or AE instruction;Energy conservation;Therapeutic activities;Cognitive remediation/compensation;Patient/family education;Balance training    OT Goals(Current goals can be found in the care plan section) Acute Rehab OT Goals Patient Stated Goal: get to chair OT Goal Formulation: With patient Time For Goal Achievement: 04/20/22 Potential to Achieve Goals: Fair  OT Frequency: Min 2X/week    Co-evaluation  AM-PAC OT "6 Clicks" Daily Activity     Outcome Measure Help from another person eating meals?: A Little Help from another person taking care of personal grooming?: A Little Help from another person toileting, which includes using toliet, bedpan, or urinal?: Total Help from another person bathing (including washing, rinsing, drying)?: Total Help from another person to put on and taking off  regular upper body clothing?: A Lot Help from another person to put on and taking off regular lower body clothing?: Total 6 Click Score: 11   End of Session    Activity Tolerance:   Patient left:    OT Visit Diagnosis: Unsteadiness on feet (R26.81);Muscle weakness (generalized) (M62.81);Other symptoms and signs involving cognitive function;Pain Pain - Right/Left: Right Pain - part of body: Knee                Time: 1153-1209 OT Time Calculation (min): 16 min Charges:  OT General Charges $OT Visit: 1 Visit OT Evaluation $OT Eval Moderate Complexity: 1 Mod  Shanda Howells, OTR/L [facility] Acute Rehabilitation Office: [phone]  Lula Olszewski 04/06/2022, 1:18 PM

## 2022-04-06 NOTE — Op Note (Signed)
[facility] Patient Name: Randall Mccormick Procedure Date : 04/06/2022 MRN: [id number] Attending MD: Estill Cotta. Loletha Carrow , MD Date of Birth: [birth year] CSN: [id number] Age: 66 Admit Type: Inpatient Procedure:                Upper GI endoscopy Indications:              Acute post hemorrhagic anemia, Coffee-ground emesis Providers:                Mallie Mussel L. Loletha Carrow, MD, Carlyn Reichert, RN, Fransico Setters                            Mbumina, Technician Referring MD:             Critical Care team Medicines:                PROPOFOL PER ICU TEAM - PATIENT ON VENTILATOR Complications:            No immediate complications. Estimated Blood Loss:     Estimated blood loss: none. Procedure:                Pre-Anesthesia Assessment:                           - Prior to the procedure, a History and Physical                            was performed, and patient medications and                            allergies were reviewed. The patient's tolerance of                            previous anesthesia was also reviewed. The risks                            and benefits of the procedure and the sedation                            options and risks were discussed with the patient.                            All questions were answered, and informed consent                            was obtained. Prior Anticoagulants: The patient has                            taken no previous anticoagulant or antiplatelet                            agents. ASA Grade Assessment: IV - A patient with                            severe systemic disease that is a constant threat  to life. After reviewing the risks and benefits,                            the patient was deemed in satisfactory condition to                            undergo the procedure.                           After obtaining informed consent, the endoscope was                            passed under direct vision. Throughout the                             procedure, the patient's blood pressure, pulse, and                            oxygen saturations were monitored continuously. The                            [id number]-H190 ([id number]) Olympus endoscope was introduced                            through the mouth, and advanced to the third part                            of duodenum. The upper GI endoscopy was                            accomplished without difficulty. The patient                            tolerated the procedure well.                           Unfortunately, a technical problem with the                            endoscopic photos was discovered while generating                            this report, and the photos are overexposed and of                            poor quality. Scope In: Scope Out: Findings:      The esophagus was normal.      The stomach was normal except for some scattered adherent dark food       material and fluid. No fresh or old blood in the UGI tract.      One non-bleeding superficial duodenal ulcer with no stigmata of bleeding       was found in the second portion of the duodenum. The lesion was 12 mm in       largest dimension. Clean-based - no appearance of  recent bleeding.      The exam of the duodenum was otherwise normal. Impression:               - Normal esophagus.                           - Normal stomach.                           - Non-bleeding duodenal ulcer with no stigmata of                            bleeding.                           - No specimens collected.                           No upper GI bleeding found, and this patient has                            also not passed melena or hematochezia to suggest                            LGI bleeding. Recommendation:           - Remain in ICU for further care                           When current bag of pantoprazole drip runs out,                            change to 40 mg IV twice daily. When recovers and                             taking PO, change to oral 40 mg twice daily for 8                            weeks. Avoid aspirin and NSAIDs                           Continue supportive care and evaluate for source of                            sepsis.                           OK for new OGT and enteral nutrition.                           Call GI as needed                           (family updated) Procedure Code(s):        --- Professional ---  17356, Esophagogastroduodenoscopy, flexible,                            transoral; diagnostic, including collection of                            specimen(s) by brushing or washing, when performed                            (separate procedure) Diagnosis Code(s):        --- Professional ---                           K26.9, Duodenal ulcer, unspecified as acute or                            chronic, without hemorrhage or perforation                           D62, Acute posthemorrhagic anemia                           K92.0, Hematemesis CPT copyright 2019 [employer] Association. All rights reserved. The codes documented in this report are preliminary and upon coder review may  be revised to meet current compliance requirements. Lavanya Roa L. Loletha Carrow, MD 04/06/2022 9:28:38 PM This report has been signed electronically. Number of Addenda: 0

## 2022-04-06 NOTE — Procedures (Signed)
Central Venous Catheter Insertion Procedure Note  Kaizer Dissinger  [id number]  1955/07/15  Date:04/06/22  Time:6:15 PM   Provider Performing:Genifer Lazenby Naomie Dean   Procedure: Insertion of Non-tunneled Central Venous [phone]) with US guidance ([id number])   Indication(s) Medication administration  Consent Risks of the procedure as well as the alternatives and risks of each were explained to the patient and/or caregiver.  Consent for the procedure was obtained and is signed in the bedside chart  Anesthesia Topical only with 1% lidocaine   Timeout Verified patient identification, verified procedure, site/side was marked, verified correct patient position, special equipment/implants available, medications/allergies/relevant history reviewed, required imaging and test results available.  Sterile Technique Maximal sterile technique including full sterile barrier drape, hand hygiene, sterile gown, sterile gloves, mask, hair covering, sterile ultrasound probe cover (if used).  Procedure Description Area of catheter insertion was cleaned with chlorhexidine and draped in sterile fashion.  With real-time ultrasound guidance a central venous catheter was placed into the right internal jugular vein. Nonpulsatile blood flow and easy flushing noted in all ports.  The catheter was sutured in place and sterile dressing applied.  Complications/Tolerance None; patient tolerated the procedure well. Chest X-ray is ordered to verify placement for internal jugular or subclavian cannulation. Guidewire was confirmed in vessel lumen prior to dilation.  Chest x-ray is not ordered for femoral cannulation.     EBL Minimal  Specimen(s) VBG run from line  Julian Hy, DO 04/06/22 6:16 PM [facility]

## 2022-04-06 NOTE — Progress Notes (Signed)
Patient arrived to the unit oriented to self, place and month. No relatives at bedside. Foley care and CHG bath done. Cardiac tele connected to [place] call light within reach, bed alarms on.

## 2022-04-06 NOTE — Progress Notes (Signed)
ABG results reviewed by Dr Tamala Julian, MD and RN reported panic HgB.  Fio2 weaned to 40% s/p ABG.  Sat 100%

## 2022-04-06 NOTE — Progress Notes (Signed)
Pt just arrived to ICU, per CCM request placed pt on bipap first d/t pt condition, then obtain ABG shortly.  Procedure currently in progress.  Pt appears to be tol bipap well currently, pt states he feels he is getting enough breath/air.  Sat 100%.

## 2022-04-06 NOTE — Progress Notes (Signed)
Actually probably a GIB, echo dry, tachy, uremia, and dropping Hgb.  His stomach looks abnormal on CT although not called as such.  Erskine Emery MD PCCM

## 2022-04-06 NOTE — Progress Notes (Signed)
Secure chat with primary RN. Reports the pt is having a CL placed at this time. No USGPIV required.

## 2022-04-06 NOTE — Procedures (Signed)
Arterial Catheter Insertion Procedure Note  Randall Mccormick  [id number]  30-Mar-1956  Date:04/06/22  Time:7:19 PM    Provider Performing: Cristal Generous    Procedure: Insertion of Arterial Line [phone]) with US guidance ([id number])   Indication(s) Blood pressure monitoring and/or need for frequent ABGs  Consent Risks of the procedure as well as the alternatives and risks of each were explained to the patient and/or caregiver.  Consent for the procedure was obtained and is signed in the bedside chart  Anesthesia 2% lidocaine    Time Out Verified patient identification, verified procedure, site/side was marked, verified correct patient position, special equipment/implants available, medications/allergies/relevant history reviewed, required imaging and test results available.   Sterile Technique Maximal sterile technique including full sterile barrier drape, hand hygiene, sterile gown, sterile gloves, mask, hair covering, sterile ultrasound probe cover (if used).   Procedure Description Area of catheter insertion was cleaned with chlorhexidine and draped in sterile fashion. Difficult placement requiring 3 attempts.   First attempt was ultrasound guided right radial arterial line -- vessel easily cannulated with arterial flash, wire would incompletely thread. Arrow removed, pressure held to achieve hemostasis. Given physician attempts at Right radial cannulation with similar difficulty, attention was then turned to L axillary artery.    Second Attempt: Cleaned and draped in usual sterile fashion. With real-time ultrasound guidance an introducer needle was inserted in the left axillary artery with arterial return, without difficulty. I started to thread the guidewire, and after about 1/2-3/4" " wire insertion into the introducer needle, met resistance -- still with arterial backflow coming through the needle. The wire could neither be advanced or retracted. Decision to withdraw the introducer  needle, while containing the guidewire. Pressure held to achieve hemostasis. While new sterile kit was being acquired, I was able to remove this wire from the needle which appeared to show unraveling of the wire at the curved tip.  Third attempt: With new kit, using sterile technique, introducer needle inserted into left axillary artery. Guidewire threaded without difficulty. Arterial catheter threaded over guidewire into left  axillary  artery and guidewire removed. Sutured and dressed in sterile fashion, biopatch in place.  Appropriate arterial tracings confirmed on monitor.     Complications/Tolerance None; patient tolerated the procedure well.   EBL 2-5cc    Specimen(s) None   Eliseo Gum MSN, AGACNP-BC [place] for pager  04/06/2022, 7:28 PM

## 2022-04-07 DIAGNOSIS — J9601 Acute respiratory failure with hypoxia: Secondary | ICD-10-CM

## 2022-04-07 DIAGNOSIS — Z789 Other specified health status: Secondary | ICD-10-CM | POA: Insufficient documentation

## 2022-04-07 DIAGNOSIS — D649 Anemia, unspecified: Secondary | ICD-10-CM | POA: Insufficient documentation

## 2022-04-07 DIAGNOSIS — N19 Unspecified kidney failure: Secondary | ICD-10-CM

## 2022-04-07 DIAGNOSIS — G9340 Encephalopathy, unspecified: Secondary | ICD-10-CM | POA: Diagnosis not present

## 2022-04-07 HISTORY — DX: Acute respiratory failure with hypoxia: J96.01

## 2022-04-07 HISTORY — DX: Anemia, unspecified: D64.9

## 2022-04-07 HISTORY — DX: Unspecified kidney failure: N19

## 2022-04-07 LAB — TYPE AND SCREEN
ABO/RH(D): A POS
Antibody Screen: NEGATIVE
Unit division: 0
Unit division: 0

## 2022-04-07 LAB — DIC (DISSEMINATED INTRAVASCULAR COAGULATION)PANEL
D-Dimer, Quant: 1.87 ug/mL-FEU — ABNORMAL HIGH (ref 0.00–0.50)
Fibrinogen: 447 mg/dL (ref 210–475)
INR: 1.4 — ABNORMAL HIGH (ref 0.8–1.2)
Platelets: 250 10*3/uL (ref 150–400)
Prothrombin Time: 17 seconds — ABNORMAL HIGH (ref 11.4–15.2)
Smear Review: NONE SEEN
aPTT: 24 seconds (ref 24–36)

## 2022-04-07 LAB — COMPREHENSIVE METABOLIC PANEL
ALT: 37 U/L (ref 0–44)
ALT: 37 U/L (ref 0–44)
AST: 49 U/L — ABNORMAL HIGH (ref 15–41)
AST: 48 U/L — ABNORMAL HIGH (ref 15–41)
Albumin: 1.8 g/dL — ABNORMAL LOW (ref 3.5–5.0)
Albumin: 1.9 g/dL — ABNORMAL LOW (ref 3.5–5.0)
Alkaline Phosphatase: 58 U/L (ref 38–126)
Alkaline Phosphatase: 60 U/L (ref 38–126)
Anion gap: 8 (ref 5–15)
Anion gap: 6 (ref 5–15)
BUN: 41 mg/dL — ABNORMAL HIGH (ref 8–23)
BUN: 38 mg/dL — ABNORMAL HIGH (ref 8–23)
CO2: 19 mmol/L — ABNORMAL LOW (ref 22–32)
CO2: 18 mmol/L — ABNORMAL LOW (ref 22–32)
Calcium: 7.2 mg/dL — ABNORMAL LOW (ref 8.9–10.3)
Calcium: 7.1 mg/dL — ABNORMAL LOW (ref 8.9–10.3)
Chloride: 113 mmol/L — ABNORMAL HIGH (ref 98–111)
Chloride: 112 mmol/L — ABNORMAL HIGH (ref 98–111)
Creatinine, Ser: 0.83 mg/dL (ref 0.61–1.24)
Creatinine, Ser: 0.86 mg/dL (ref 0.61–1.24)
GFR, Estimated: >60 mL/min (ref >60)
GFR, Estimated: >60 mL/min (ref >60)
Glucose, Bld: 143 mg/dL — ABNORMAL HIGH (ref 70–99)
Glucose, Bld: 164 mg/dL — ABNORMAL HIGH (ref 70–99)
Potassium: 3.8 mmol/L (ref 3.5–5.1)
Potassium: 3.6 mmol/L (ref 3.5–5.1)
Sodium: 140 mmol/L (ref 135–145)
Sodium: 136 mmol/L (ref 135–145)
Total Bilirubin: 1 mg/dL (ref 0.3–1.2)
Total Bilirubin: 1 mg/dL (ref 0.3–1.2)
Total Protein: 4.3 g/dL — ABNORMAL LOW (ref 6.5–8.1)
Total Protein: 4.4 g/dL — ABNORMAL LOW (ref 6.5–8.1)

## 2022-04-07 LAB — HEPATITIS PANEL, ACUTE
HCV Ab: NONREACTIVE
Hep A IgM: NONREACTIVE
Hep B C IgM: NONREACTIVE
Hepatitis B Surface Ag: NONREACTIVE

## 2022-04-07 LAB — RESPIRATORY PANEL BY PCR

## 2022-04-07 LAB — BPAM RBC
Blood Product Expiration Date: 202310262359
Blood Product Expiration Date: 202311052359
ISSUE DATE / TIME: 202310141824
ISSUE DATE / TIME: 202310141948
Unit Type and Rh: 6200
Unit Type and Rh: 6200

## 2022-04-07 LAB — GLUCOSE, CAPILLARY
Glucose-Capillary: 128 mg/dL — ABNORMAL HIGH (ref 70–99)
Glucose-Capillary: 156 mg/dL — ABNORMAL HIGH (ref 70–99)
Glucose-Capillary: 119 mg/dL — ABNORMAL HIGH (ref 70–99)
Glucose-Capillary: 99 mg/dL (ref 70–99)
Glucose-Capillary: 129 mg/dL — ABNORMAL HIGH (ref 70–99)
Glucose-Capillary: 101 mg/dL — ABNORMAL HIGH (ref 70–99)

## 2022-04-07 LAB — TRIGLYCERIDES: Triglycerides: 217 mg/dL — ABNORMAL HIGH (ref <150)

## 2022-04-07 LAB — POCT I-STAT 7, (LYTES, BLD GAS, ICA,H+H)
Acid-base deficit: 6 mmol/L — ABNORMAL HIGH (ref 0.0–2.0)
Bicarbonate: 17 mmol/L — ABNORMAL LOW (ref 20.0–28.0)
Calcium, Ion: 1.13 mmol/L — ABNORMAL LOW (ref 1.15–1.40)
HCT: 24 % — ABNORMAL LOW (ref 39.0–52.0)
Hemoglobin: 8.2 g/dL — ABNORMAL LOW (ref 13.0–17.0)
O2 Saturation: 97 %
Patient temperature: 98.9
Potassium: 4 mmol/L (ref 3.5–5.1)
Sodium: 139 mmol/L (ref 135–145)
TCO2: 18 mmol/L — ABNORMAL LOW (ref 22–32)
pCO2 arterial: 23.7 mmHg — ABNORMAL LOW (ref 32–48)
pH, Arterial: 7.463 — ABNORMAL HIGH (ref 7.35–7.45)
pO2, Arterial: 88 mmHg (ref 83–108)

## 2022-04-07 LAB — CBC
HCT: 25.8 % — ABNORMAL LOW (ref 39.0–52.0)
HCT: 25.3 % — ABNORMAL LOW (ref 39.0–52.0)
HCT: 24 % — ABNORMAL LOW (ref 39.0–52.0)
Hemoglobin: 8.9 g/dL — ABNORMAL LOW (ref 13.0–17.0)
Hemoglobin: 9.1 g/dL — ABNORMAL LOW (ref 13.0–17.0)
Hemoglobin: 8.1 g/dL — ABNORMAL LOW (ref 13.0–17.0)
MCH: 32.6 pg (ref 26.0–34.0)
MCH: 34 pg (ref 26.0–34.0)
MCH: 32.7 pg (ref 26.0–34.0)
MCHC: 34.5 g/dL (ref 30.0–36.0)
MCHC: 36 g/dL (ref 30.0–36.0)
MCHC: 33.8 g/dL (ref 30.0–36.0)
MCV: 94.5 fL (ref 80.0–100.0)
MCV: 94.4 fL (ref 80.0–100.0)
MCV: 96.8 fL (ref 80.0–100.0)
Platelets: 272 10*3/uL (ref 150–400)
Platelets: 251 10*3/uL (ref 150–400)
Platelets: 209 10*3/uL (ref 150–400)
RBC: 2.73 MIL/uL — ABNORMAL LOW (ref 4.22–5.81)
RBC: 2.68 MIL/uL — ABNORMAL LOW (ref 4.22–5.81)
RBC: 2.48 MIL/uL — ABNORMAL LOW (ref 4.22–5.81)
RDW: 16.7 % — ABNORMAL HIGH (ref 11.5–15.5)
RDW: 16.6 % — ABNORMAL HIGH (ref 11.5–15.5)
RDW: 16.7 % — ABNORMAL HIGH (ref 11.5–15.5)
WBC: 25.8 10*3/uL — ABNORMAL HIGH (ref 4.0–10.5)
WBC: 28.2 10*3/uL — ABNORMAL HIGH (ref 4.0–10.5)
WBC: 26.7 10*3/uL — ABNORMAL HIGH (ref 4.0–10.5)
nRBC: 0 % (ref 0.0–0.2)
nRBC: 0 % (ref 0.0–0.2)
nRBC: 0 % (ref 0.0–0.2)

## 2022-04-07 LAB — COOXEMETRY PANEL
Carboxyhemoglobin: 2.3 % — ABNORMAL HIGH (ref 0.5–1.5)
Methemoglobin: <0.7 % (ref 0.0–1.5)
O2 Saturation: 70 %
Total hemoglobin: 9.1 g/dL — ABNORMAL LOW (ref 12.0–16.0)

## 2022-04-07 LAB — TROPONIN I (HIGH SENSITIVITY): Troponin I (High Sensitivity): 22 ng/L — ABNORMAL HIGH (ref <18)

## 2022-04-07 LAB — LACTIC ACID, PLASMA: Lactic Acid, Venous: 1.7 mmol/L (ref 0.5–1.9)

## 2022-04-07 LAB — PROCALCITONIN: Procalcitonin: 0.66 ng/mL

## 2022-04-07 MED ORDER — FENTANYL CITRATE PF 50 MCG/ML IJ SOSY
25.0000 ug | PREFILLED_SYRINGE | INTRAMUSCULAR | Status: DC | PRN
  Administered 2022-04-07: 25 ug via INTRAVENOUS
  Filled 2022-04-07: qty 1

## 2022-04-07 MED ORDER — DOCUSATE SODIUM 50 MG/5ML PO LIQD: 100.0000 mg | Freq: Two times a day (BID) | ORAL | Status: DC

## 2022-04-07 MED ORDER — CALCIUM GLUCONATE-NACL 1-0.675 GM/50ML-% IV SOLN
1.0000 g | Freq: Once | INTRAVENOUS | Status: AC
  Administered 2022-04-07: 1000 mg via INTRAVENOUS
  Filled 2022-04-07: qty 50

## 2022-04-07 MED ORDER — VANCOMYCIN HCL 1750 MG/350ML IV SOLN
1750.0000 mg | INTRAVENOUS | Status: DC
  Administered 2022-04-07: 1750 mg via INTRAVENOUS
  Filled 2022-04-07 (×2): qty 350

## 2022-04-07 MED ORDER — ORAL CARE MOUTH RINSE
15.0000 mL | OROMUCOSAL | Status: DC
  Administered 2022-04-07 – 2022-04-08 (×5): 15 mL via OROMUCOSAL

## 2022-04-07 MED ORDER — SENNOSIDES 8.8 MG/5ML PO SYRP
5.0000 mL | ORAL_SOLUTION | Freq: Two times a day (BID) | ORAL | Status: DC
  Administered 2022-04-08 – 2022-04-09 (×2): 5 mL via ORAL
  Filled 2022-04-07 (×3): qty 5

## 2022-04-07 MED ORDER — SODIUM CHLORIDE 0.9 % IV SOLN
2.0000 g | Freq: Three times a day (TID) | INTRAVENOUS | Status: DC
  Administered 2022-04-07 – 2022-04-09 (×7): 2 g via INTRAVENOUS
  Filled 2022-04-07 (×7): qty 12.5

## 2022-04-07 MED ORDER — ORAL CARE MOUTH RINSE: 15.0000 mL | OROMUCOSAL | Status: DC | PRN

## 2022-04-07 MED ORDER — SORBITOL 70 % SOLN
960.0000 mL | TOPICAL_OIL | ORAL | Status: AC
  Administered 2022-04-07: 960 mL via RECTAL
  Filled 2022-04-07: qty 240

## 2022-04-07 MED ORDER — PHENOBARBITAL SODIUM 65 MG/ML IJ SOLN
65.0000 mg | Freq: Every day | INTRAMUSCULAR | Status: DC
  Administered 2022-04-07: 65 mg via INTRAVENOUS
  Filled 2022-04-07: qty 1
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
## 2022-04-07 NOTE — Progress Notes (Signed)
Currently no distress noted, pt states he feels "pretty good".  Per discussion w/ Dr Tamala Julian, CCM okay to make bipap prn.  Sat currently 94%

## 2022-04-07 NOTE — Procedures (Signed)
Extubation Procedure Note  Patient Details:   Name: Randall Mccormick DOB: [birth year] MRN: [id number]   Airway Documentation:    Vent end date: 04/07/22 Vent end time: 1220   Evaluation  O2 sats: stable throughout Complications: No apparent complications Patient did tolerate procedure well. Bilateral Breath Sounds: Clear, Diminished   Yes  No stridor noted. Leak test positive for airflow.  Sherylann Vangorden 04/07/2022, 12:22 PM

## 2022-04-07 NOTE — Progress Notes (Signed)
eLink Physician-Brief Progress Note Patient Name: Randall Mccormick DOB: Apr 03, 1956 MRN: [id number]   Date of Service  04/07/2022  HPI/Events of Note  Pt extubated earlier and requesting for pain medication for knee pain.   Pt was on fentanyl gtt while intubated.  eICU Interventions  Ok to give fentanyl IV PRN now.  Transition to PO in the morning.      Intervention Category Intermediate Interventions: Pain - evaluation and management  Elsie Lincoln 04/07/2022, 9:22 PM
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
## 2022-04-07 NOTE — Progress Notes (Addendum)
Pharmacy Antibiotic Note  Randall Mccormick is a 66 y.o. male admitted on 04/05/2022 with sepsis - possible intra-abdominal /lung source. Pharmacy has been consulted for vancomycin and cefepime dosing. Also on Flagyl per MD.   WBC remains elevated. Afebrile. Requiring Norepinephrine.  SCr has improved at 0.83. Good urine output. Plan to extubate today.   Plan: Adjust Vancomycin to 1750 mg Q24h (eAUC 459, Scr 0.83, Vd 0.5) Cefepime 2g IV every 8 hours.  Flagyl per MD. Monitor renal function for improvement, s/s of infection, follow culutres for pathogen directed therapy     Temp (24hrs), Avg:98.5 F (36.9 C), Min:97.8 F (36.6 C), Max:99.6 F (37.6 C)  Recent Labs  Lab 04/05/22 0940 04/05/22 0948 04/05/22 1158 04/05/22 1839 04/05/22 2214 04/06/22 0746 04/06/22 1433 04/06/22 1441 04/07/22 0516  WBC 15.0*  --   --  14.7*  --  19.3* 24.7*  --  25.8*  CREATININE 1.47* 1.60*  --   --   --  0.73 1.23  --  0.83  LATICACIDVEN  --   --  2.5* 1.3 1.2  --   --  6.3*  --     Estimated Creatinine Clearance: 101.4 mL/min (by C-G formula based on SCr of 0.83 mg/dL).    No Known Allergies  Antimicrobials this admission: Vancomycin 10/13 >>  Cefepime 10/13 >>  Flagyl 10/14 >>  Dose adjustments this admission: N/a  Microbiology results: 10/13 BCx: sent  Thank you for allowing pharmacy to be a part of this patient's care.  Sloan Leiter, PharmD, BCPS, BCCCP Clinical Pharmacist Please refer to [facility] for [place] numbers 04/07/2022 8:49 AM

## 2022-04-08 DIAGNOSIS — G9340 Encephalopathy, unspecified: Secondary | ICD-10-CM | POA: Diagnosis not present

## 2022-04-08 LAB — PROCALCITONIN: Procalcitonin: 0.24 ng/mL

## 2022-04-08 LAB — GLUCOSE, CAPILLARY
Glucose-Capillary: 107 mg/dL — ABNORMAL HIGH (ref 70–99)
Glucose-Capillary: 113 mg/dL — ABNORMAL HIGH (ref 70–99)
Glucose-Capillary: 96 mg/dL (ref 70–99)
Glucose-Capillary: 112 mg/dL — ABNORMAL HIGH (ref 70–99)
Glucose-Capillary: 110 mg/dL — ABNORMAL HIGH (ref 70–99)

## 2022-04-08 LAB — CBC
HCT: 23.6 % — ABNORMAL LOW (ref 39.0–52.0)
Hemoglobin: 7.9 g/dL — ABNORMAL LOW (ref 13.0–17.0)
MCH: 33.2 pg (ref 26.0–34.0)
MCHC: 33.5 g/dL (ref 30.0–36.0)
MCV: 99.2 fL (ref 80.0–100.0)
Platelets: 186 10*3/uL (ref 150–400)
RBC: 2.38 MIL/uL — ABNORMAL LOW (ref 4.22–5.81)
RDW: 16.5 % — ABNORMAL HIGH (ref 11.5–15.5)
WBC: 19 10*3/uL — ABNORMAL HIGH (ref 4.0–10.5)
nRBC: 0 % (ref 0.0–0.2)

## 2022-04-08 LAB — BASIC METABOLIC PANEL
Anion gap: 4 — ABNORMAL LOW (ref 5–15)
BUN: 15 mg/dL (ref 8–23)
CO2: 22 mmol/L (ref 22–32)
Calcium: 7.6 mg/dL — ABNORMAL LOW (ref 8.9–10.3)
Chloride: 110 mmol/L (ref 98–111)
Creatinine, Ser: 0.69 mg/dL (ref 0.61–1.24)
GFR, Estimated: >60 mL/min (ref >60)
Glucose, Bld: 118 mg/dL — ABNORMAL HIGH (ref 70–99)
Potassium: 4.2 mmol/L (ref 3.5–5.1)
Sodium: 136 mmol/L (ref 135–145)

## 2022-04-08 LAB — MRSA NEXT GEN BY PCR, NASAL: MRSA by PCR Next Gen: NOT DETECTED

## 2022-04-08 MED ORDER — BISACODYL 10 MG RE SUPP
10.0000 mg | Freq: Every day | RECTAL | Status: DC
  Administered 2022-04-08: 10 mg via RECTAL
  Filled 2022-04-08 (×2): qty 1

## 2022-04-08 MED ORDER — ASPIRIN 81 MG PO CHEW
81.0000 mg | CHEWABLE_TABLET | Freq: Two times a day (BID) | ORAL | Status: DC
  Administered 2022-04-08 – 2022-04-12 (×9): 81 mg via ORAL
  Filled 2022-04-08 (×9): qty 1

## 2022-04-08 MED ORDER — ALLOPURINOL 300 MG PO TABS
300.0000 mg | ORAL_TABLET | Freq: Every day | ORAL | Status: DC
  Administered 2022-04-08 – 2022-04-12 (×5): 300 mg via ORAL
  Filled 2022-04-08 (×5): qty 1

## 2022-04-08 MED ORDER — ENOXAPARIN SODIUM 40 MG/0.4ML IJ SOSY
40.0000 mg | PREFILLED_SYRINGE | INTRAMUSCULAR | Status: DC
  Administered 2022-04-09 – 2022-04-12 (×4): 40 mg via SUBCUTANEOUS
  Filled 2022-04-08 (×5): qty 0.4

## 2022-04-08 MED ORDER — METRONIDAZOLE 500 MG PO TABS
500.0000 mg | ORAL_TABLET | Freq: Two times a day (BID) | ORAL | Status: DC
  Administered 2022-04-08: 500 mg via ORAL
  Filled 2022-04-08: qty 1

## 2022-04-08 MED ORDER — TRAZODONE HCL 50 MG PO TABS
100.0000 mg | ORAL_TABLET | Freq: Every day | ORAL | Status: DC
  Administered 2022-04-08 – 2022-04-11 (×4): 100 mg via ORAL
  Filled 2022-04-08 (×4): qty 2

## 2022-04-08 MED ORDER — OLANZAPINE 5 MG PO TABS
10.0000 mg | ORAL_TABLET | Freq: Every day | ORAL | Status: DC
  Administered 2022-04-08 – 2022-04-11 (×4): 10 mg via ORAL
  Filled 2022-04-08: qty 2
  Filled 2022-04-08: qty 1
  Filled 2022-04-08 (×2): qty 2
  Filled 2022-04-08: qty 1

## 2022-04-08 MED ORDER — PANTOPRAZOLE SODIUM 40 MG PO TBEC
40.0000 mg | DELAYED_RELEASE_TABLET | Freq: Two times a day (BID) | ORAL | Status: DC
  Administered 2022-04-08 – 2022-04-12 (×9): 40 mg via ORAL
  Filled 2022-04-08 (×9): qty 1

## 2022-04-08 MED ORDER — SODIUM CHLORIDE 0.9% FLUSH
10.0000 mL | INTRAVENOUS | Status: DC | PRN
  Administered 2022-04-08: 10 mL

## 2022-04-08 MED ORDER — TAMSULOSIN HCL 0.4 MG PO CAPS
0.4000 mg | ORAL_CAPSULE | Freq: Every evening | ORAL | Status: DC
  Administered 2022-04-08 – 2022-04-12 (×5): 0.4 mg via ORAL
  Filled 2022-04-08 (×5): qty 1

## 2022-04-08 MED ORDER — LORAZEPAM 1 MG PO TABS: 1.0000 mg | ORAL_TABLET | ORAL | Status: DC | PRN

## 2022-04-08 MED ORDER — ENSURE ENLIVE PO LIQD
237.0000 mL | Freq: Three times a day (TID) | ORAL | Status: DC
  Administered 2022-04-08 – 2022-04-12 (×11): 237 mL via ORAL

## 2022-04-08 MED ORDER — ADULT MULTIVITAMIN W/MINERALS CH
1.0000 | ORAL_TABLET | Freq: Every day | ORAL | Status: DC
  Administered 2022-04-08 – 2022-04-12 (×5): 1 via ORAL
  Filled 2022-04-08 (×5): qty 1

## 2022-04-08 MED ORDER — OXYCODONE HCL 5 MG PO TABS
10.0000 mg | ORAL_TABLET | ORAL | Status: DC | PRN
  Filled 2022-04-08: qty 2

## 2022-04-08 MED ORDER — BACLOFEN 10 MG PO TABS
5.0000 mg | ORAL_TABLET | Freq: Three times a day (TID) | ORAL | Status: DC
  Administered 2022-04-08 – 2022-04-12 (×14): 5 mg via ORAL
  Filled 2022-04-08 (×14): qty 1

## 2022-04-08 MED ORDER — FOLIC ACID 1 MG PO TABS: 1.0000 mg | ORAL_TABLET | Freq: Every day | ORAL | Status: DC

## 2022-04-08 MED ORDER — ALPRAZOLAM 0.5 MG PO TABS
0.5000 mg | ORAL_TABLET | Freq: Three times a day (TID) | ORAL | Status: DC | PRN
  Administered 2022-04-08 – 2022-04-12 (×2): 0.5 mg via ORAL
  Filled 2022-04-08: qty 2
  Filled 2022-04-08: qty 1

## 2022-04-08 MED ORDER — METOCLOPRAMIDE HCL 5 MG/ML IJ SOLN
10.0000 mg | Freq: Three times a day (TID) | INTRAMUSCULAR | Status: DC
  Administered 2022-04-08 – 2022-04-09 (×4): 10 mg via INTRAVENOUS
  Filled 2022-04-08 (×4): qty 2

## 2022-04-08 MED ORDER — SODIUM CHLORIDE 0.9% FLUSH
10.0000 mL | Freq: Two times a day (BID) | INTRAVENOUS | Status: DC
  Administered 2022-04-08 – 2022-04-12 (×7): 10 mL

## 2022-04-08 MED ORDER — LORAZEPAM 2 MG/ML IJ SOLN: 1.0000 mg | INTRAMUSCULAR | Status: DC | PRN

## 2022-04-08 MED ORDER — GABAPENTIN 300 MG PO CAPS
300.0000 mg | ORAL_CAPSULE | Freq: Three times a day (TID) | ORAL | Status: DC
  Administered 2022-04-08 – 2022-04-12 (×14): 300 mg via ORAL
  Filled 2022-04-08 (×14): qty 1

## 2022-04-08 NOTE — Progress Notes (Signed)
Orthopedic Tech Progress Note Patient Details:  Randall Mccormick 20-Apr-1956 [id number]  CPM Right Knee CPM Right Knee: On Right Knee Flexion (Degrees): 5 Right Knee Extension (Degrees): 75  Post Interventions Patient Tolerated: Well  Randall Mccormick A Randall Mccormick 04/08/2022, 5:27 PM

## 2022-04-08 NOTE — Progress Notes (Signed)
Ortho tech paged and made aware CPM needs to be placed.
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
## 2022-04-08 NOTE — Progress Notes (Signed)
Physical Therapy Treatment Patient Details Name: Randall Mccormick MRN: [id number] DOB: 1955/09/30 Today's Date: 04/08/2022   History of Present Illness 66 y/o person with recent right total knee arthroplasty on 03/28/22 who presented 04/05/22 to the ED from [facility] after having somnolence, encephalopathy, dysarthria, and lower extremity weakness. Head CT negative; EEG negativve;  Pt has PMHx:  urinary retention, incomplete paraplegia, spasticity, wheelchair dependence but could walk short distances last admit, thoracic spondylosis, neurogenic bladder, neuropathic pain, chronic pain, anxiety, gout, HTN, L rib fractures    PT Comments    Pt admitted with above diagnosis. Pt was able to stand with +2 mod to max assist but fatigued quickly and needed to lie back down after only working withPT about 10 min. Pt with slight incr tolerance today but still struggles with pain and poor endurance. MD to order CPM to maintain right knee ROM.  Continue PT.  Pt currently with functional limitations due to balance and endurance deficits. Pt will benefit from skilled PT to increase their independence and safety with mobility to allow discharge to the venue listed below.      Recommendations for follow up therapy are one component of a multi-disciplinary discharge planning process, led by the attending physician.  Recommendations may be updated based on patient status, additional functional criteria and insurance authorization.  Follow Up Recommendations  Skilled nursing-short term rehab (<3 hours/day) Can patient physically be transported by private vehicle: No   Assistance Recommended at Discharge Frequent or constant Supervision/Assistance  Patient can return home with the following Assistance with cooking/housework;Assist for transportation;Help with stairs or ramp for entrance;Two people to help with walking and/or transfers;Assistance with feeding;Direct supervision/assist for medications  management;Direct supervision/assist for financial management   Equipment Recommendations  None recommended by PT    Recommendations for Other Services       Precautions / Restrictions Precautions Precautions: Fall Precaution Comments: total knee precautions Restrictions Weight Bearing Restrictions: Yes RLE Weight Bearing: Weight bearing as tolerated     Mobility  Bed Mobility Overal bed mobility: Needs Assistance Bed Mobility: Supine to Sit Rolling: Mod assist Sidelying to sit: Mod assist     Sit to sidelying: Total assist, +2 for physical assistance General bed mobility comments: Pt needed assist for LEs and elevation  of trunk to EOB with incr time needed and use of pad. Incr time to scoot pt to EOB.    Transfers Overall transfer level: Needs assistance Equipment used: Rolling walker (2 wheels) Transfers: Sit to/from Stand Sit to Stand: Mod assist, +2 physical assistance, From elevated surface, Max assist           General transfer comment: Pt needed mod to max assist to stand to RW with cues for hand placement.  Pt not standing fully uprighht as well as could not take pivotal steps to chair as pt with right knee buckling and could not weight shift to right LE. Had to sit pt back down. Pt had wanted to get to 3N1 however pt too weak to attempt therefore laid back down with nurse assist and placed pt on bedpan.    Ambulation/Gait                   Stairs             Wheelchair Mobility    Modified Rankin (Stroke Patients Only)       Balance Overall balance assessment: Needs assistance Sitting-balance support: Feet supported, Bilateral upper extremity supported, No upper extremity supported  Sitting balance-Leahy Scale: Poor Sitting balance - Comments: Pt sat EOB x 10 min initially needing mod assist progressing to min guard assist however once he fatigued after stand needing mod assist again.   Standing balance support: Bilateral upper  extremity supported, During functional activity Standing balance-Leahy Scale: Poor Standing balance comment: Pt needed +2 mod to max assist to stand statically with RWfor about 20 seconds. Fatigues quickly and cannot stand fully upright.                            Cognition Arousal/Alertness: Awake/alert Behavior During Therapy: WFL for tasks assessed/performed Overall Cognitive Status: Impaired/Different from baseline Area of Impairment: Orientation, Attention, Memory, Following commands, Awareness, Problem solving                 Orientation Level: Time Current Attention Level: Sustained Memory: Decreased recall of precautions Following Commands: Follows one step commands consistently, Follows one step commands with increased time   Awareness: Intellectual Problem Solving: Slow processing, Decreased initiation, Requires verbal cues, Requires tactile cues General Comments: able to tell PT he was pivoting to chair at [employer] and why he is here.        Exercises Total Joint Exercises Ankle Circles/Pumps: AROM, Both, 10 reps, Supine Quad Sets: AROM, Right, 10 reps Heel Slides: AAROM, Both, 10 reps, Supine Straight Leg Raises: AAROM, Right, 5 reps, Supine Long Arc Quad: AAROM, Right, 5 reps, Seated    General Comments General comments (skin integrity, edema, etc.): VSS; nurse asked about CPM.  Contacted MD as pt did have it at SNF.  MD ordered      Pertinent Vitals/Pain Pain Assessment Pain Assessment: Faces Faces Pain Scale: Hurts whole lot Pain Location: R knee Pain Descriptors / Indicators: Grimacing, Guarding Pain Intervention(s): Limited activity within patient's tolerance, Monitored during session, Repositioned    Home Living                          Prior Function            PT Goals (current goals can now be found in the care plan section) Acute Rehab PT Goals Patient Stated Goal: to get home sooner Progress towards PT goals:  Progressing toward goals    Frequency    Min 3X/week      PT Plan Current plan remains appropriate    Co-evaluation              AM-PAC PT "6 Clicks" Mobility   Outcome Measure  Help needed turning from your back to your side while in a flat bed without using bedrails?: A Lot Help needed moving from lying on your back to sitting on the side of a flat bed without using bedrails?: Total Help needed moving to and from a bed to a chair (including a wheelchair)?: Total Help needed standing up from a chair using your arms (e.g., wheelchair or bedside chair)?: Total Help needed to walk in hospital room?: Total Help needed climbing 3-5 steps with a railing? : Total 6 Click Score: 7    End of Session Equipment Utilized During Treatment: Gait belt Activity Tolerance: Patient limited by fatigue;Patient limited by pain Patient left: in bed;with call bell/phone within reach;with bed alarm set;with nursing/sitter in room Nurse Communication: Mobility status;Need for lift equipment PT Visit Diagnosis: Unsteadiness on feet (R26.81);Muscle weakness (generalized) (M62.81);Pain Pain - Right/Left: Right Pain - part of body: Knee  Time: 5809-9833 PT Time Calculation (min) (ACUTE ONLY): 32 min  Charges:  $Therapeutic Exercise: 8-22 mins $Therapeutic Activity: 8-22 mins                     [facility] M,PT Acute Rehab Services [phone]    Alvira Philips 04/08/2022, 4:07 PM

## 2022-04-08 NOTE — Progress Notes (Signed)
Brief Nutrition Note  Discussed pt with RN and during ICU rounds. Per RN, pt with poor appetite. Pt was extubated on 10/15 and diet advanced to Heart Healthy. No meal completions documented. Per RN, pt like vanilla Ensure supplements.  RD to liberalize diet to Regular to provide maximum food options. Will also order Ensure Enlive TID between meals.  RD will continue to follow pt during admission.   Gustavus Bryant, MS, RD, LDN Inpatient Clinical Dietitian Please see [facility] for contact information.

## 2022-04-09 ENCOUNTER — Encounter (HOSPITAL_COMMUNITY): Payer: Self-pay | Admitting: Gastroenterology

## 2022-04-09 ENCOUNTER — Inpatient Hospital Stay (HOSPITAL_COMMUNITY): Payer: MEDICARE

## 2022-04-09 DIAGNOSIS — L8990 Pressure ulcer of unspecified site, unspecified stage: Secondary | ICD-10-CM

## 2022-04-09 DIAGNOSIS — G9340 Encephalopathy, unspecified: Secondary | ICD-10-CM | POA: Diagnosis not present

## 2022-04-09 HISTORY — DX: Pressure ulcer of unspecified site, unspecified stage: L89.90

## 2022-04-09 LAB — CBC
HCT: 26 % — ABNORMAL LOW (ref 39.0–52.0)
Hemoglobin: 8.6 g/dL — ABNORMAL LOW (ref 13.0–17.0)
MCH: 33 pg (ref 26.0–34.0)
MCHC: 33.1 g/dL (ref 30.0–36.0)
MCV: 99.6 fL (ref 80.0–100.0)
Platelets: 195 10*3/uL (ref 150–400)
RBC: 2.61 MIL/uL — ABNORMAL LOW (ref 4.22–5.81)
RDW: 16.1 % — ABNORMAL HIGH (ref 11.5–15.5)
WBC: 14.1 10*3/uL — ABNORMAL HIGH (ref 4.0–10.5)
nRBC: 0 % (ref 0.0–0.2)

## 2022-04-09 LAB — BASIC METABOLIC PANEL
Anion gap: 6 (ref 5–15)
BUN: 11 mg/dL (ref 8–23)
CO2: 22 mmol/L (ref 22–32)
Calcium: 7.8 mg/dL — ABNORMAL LOW (ref 8.9–10.3)
Chloride: 110 mmol/L (ref 98–111)
Creatinine, Ser: 0.69 mg/dL (ref 0.61–1.24)
GFR, Estimated: >60 mL/min (ref >60)
Glucose, Bld: 99 mg/dL (ref 70–99)
Potassium: 4.1 mmol/L (ref 3.5–5.1)
Sodium: 138 mmol/L (ref 135–145)

## 2022-04-09 LAB — AMMONIA: Ammonia: 26 umol/L (ref 9–35)

## 2022-04-09 LAB — CULTURE, RESPIRATORY W GRAM STAIN

## 2022-04-09 LAB — GLUCOSE, CAPILLARY
Glucose-Capillary: 85 mg/dL (ref 70–99)
Glucose-Capillary: 99 mg/dL (ref 70–99)

## 2022-04-09 MED ORDER — SODIUM CHLORIDE 0.9 % IV SOLN
3.0000 g | Freq: Four times a day (QID) | INTRAVENOUS | Status: AC
  Administered 2022-04-09 – 2022-04-11 (×11): 3 g via INTRAVENOUS
  Filled 2022-04-09 (×11): qty 8

## 2022-04-09 MED ORDER — SENNA 8.6 MG PO TABS
1.0000 | ORAL_TABLET | Freq: Two times a day (BID) | ORAL | Status: DC
  Administered 2022-04-09: 8.6 mg via ORAL
  Filled 2022-04-09: qty 1

## 2022-04-09 NOTE — Significant Event (Signed)
Rapid Response Event Note   Reason for Call :  Confusion, facial droop, [id number]  Initial Focused Assessment:  Pt lying in bed with eyes closed. Pt will open eyes to verbal command, follow commands, and move all extremities. He is very sleepy and falls back asleep immediately with no stimulation. Pupils 2, equal, and reactive. NIH-3 for lethargy, LOC questions, and R facial droop.   T-98.3, HR-86, BP-122/59, RR-30, SpO2-95 on RA  Code Stroke not initiated per Dr. Cheral Marker. Pt with h/o coffee ground emesis this admission and no LVO signs.   Interventions:  CBG-85 CT head STAT Plan of Care:  Await CT results and relay to MD. ABG may be helpful as pt is very sleepy/unable to stay awake without stimulation. Please call RRT if further assistance needed.   Event Summary:   MD Notified: Dr. Cheral Marker, Dr. Oletta Darter Call [place] [phone] End Time:0506  Dillard Essex, RN

## 2022-04-09 NOTE — Progress Notes (Signed)
Occupational Therapy Treatment Patient Details Name: Randall Mccormick MRN: [id number] DOB: March 05, 1956 Today's Date: 04/09/2022   History of present illness 66 y/o person with recent right total knee arthroplasty on 03/28/22 who presented 04/05/22 to the ED from [facility] after having somnolence, encephalopathy, dysarthria, and lower extremity weakness. Head CT negative; EEG negativve;  Pt has PMHx:  urinary retention, incomplete paraplegia, spasticity, wheelchair dependence but could walk short distances last admit, thoracic spondylosis, neurogenic bladder, neuropathic pain, chronic pain, anxiety, gout, HTN, L rib fractures   OT comments  Patient moved to EOB and discovered his bed was wet. Patient stood into stedy and had soiled himself and stood a second time for cleaning with patient assisting with cleaning peri area while seated. Patient was transferred to recliner with Stedy and instructed in BUE strengthening exercises with red therapy band. Patient to continue to be followed by acute OT with discharge recommendations continue to be appropriate.    Recommendations for follow up therapy are one component of a multi-disciplinary discharge planning process, led by the attending physician.  Recommendations may be updated based on patient status, additional functional criteria and insurance authorization.    Follow Up Recommendations  Skilled nursing-short term rehab (<3 hours/day)    Assistance Recommended at Discharge Frequent or constant Supervision/Assistance  Patient can return home with the following  A lot of help with walking and/or transfers;A lot of help with bathing/dressing/bathroom;Assistance with cooking/housework;Direct supervision/assist for medications management;Assist for transportation;Direct supervision/assist for financial management;Help with stairs or ramp for entrance   Equipment Recommendations  Other (comment) (defer to next venue)    Recommendations for  Other Services      Precautions / Restrictions Precautions Precautions: Fall Precaution Comments: total knee precautions Restrictions Weight Bearing Restrictions: Yes RLE Weight Bearing: Weight bearing as tolerated       Mobility Bed Mobility Overal bed mobility: Needs Assistance Bed Mobility: Supine to Sit Rolling: Min assist Sidelying to sit: Min assist       General bed mobility comments: HOB raised and assistance with BLEs    Transfers Overall transfer level: Needs assistance Equipment used: Ambulation equipment used Transfers: Sit to/from Stand, Bed to chair/wheelchair/BSC Sit to Stand: Mod assist, +2 physical assistance, From elevated surface           General transfer comment: Patient required ssistance to power up and to bring hips forward to lower lift pads. Transfer via Lift Equipment: Stedy   Balance Overall balance assessment: Needs assistance Sitting-balance support: Feet supported, Bilateral upper extremity supported, No upper extremity supported Sitting balance-Leahy Scale: Poor Sitting balance - Comments: Pt sat EOB x 5 min initially needing min assist progressing to min guard assist   Standing balance support: Bilateral upper extremity supported, During functional activity Standing balance-Leahy Scale: Poor Standing balance comment: Pt needed +2 mod assist to stand statically with Stedy for up to 2 min. Fatigues quickly and cannot stand fully upright.                           ADL either performed or assessed with clinical judgement   ADL Overall ADL's : Needs assistance/impaired             Lower Body Bathing: Moderate assistance;Maximal assistance;Sitting/lateral leans;Sit to/from stand Lower Body Bathing Details (indicate cue type and reason): able to bathe peri area while stand and required max assist for peri area back and back of legs while standing     Lower Body  Dressing: Total assistance;Bed level Lower Body Dressing  Details (indicate cue type and reason): to donn socks               General ADL Comments: Patient's bed was wet and patient had soiled self and assisted with bathing seated and required max assist for cleaning when standing in stedy    Extremity/Trunk Assessment              Vision       Perception     Praxis      Cognition Arousal/Alertness: Awake/alert Behavior During Therapy: WFL for tasks assessed/performed Overall Cognitive Status: Impaired/Different from baseline Area of Impairment: Orientation, Attention, Memory, Following commands, Awareness, Problem solving                 Orientation Level: Time Current Attention Level: Sustained Memory: Decreased recall of precautions Following Commands: Follows one step commands consistently, Follows one step commands with increased time   Awareness: Intellectual Problem Solving: Slow processing, Decreased initiation, Requires verbal cues, Requires tactile cues          Exercises Exercises: General Upper Extremity General Exercises - Upper Extremity Shoulder Flexion: AROM, Both, 10 reps, Seated Shoulder ABduction: Strengthening, Both, 10 reps, Seated, Theraband Theraband Level (Shoulder Abduction): Level 2 (Red) Elbow Flexion: Strengthening, Both, 10 reps, Seated, Theraband Theraband Level (Elbow Flexion): Level 2 (Red) Elbow Extension: Strengthening, Both, 10 reps, Seated, Theraband Theraband Level (Elbow Extension): Level 2 (Red)    Shoulder Instructions       General Comments VSS    Pertinent Vitals/ Pain       Pain Assessment Pain Assessment: Faces Faces Pain Scale: Hurts whole lot Pain Location: R knee Pain Descriptors / Indicators: Grimacing, Guarding Pain Intervention(s): Limited activity within patient's tolerance, Monitored during session, Repositioned  Home Living                                          Prior Functioning/Environment              Frequency  Min  2X/week        Progress Toward Goals  OT Goals(current goals can now be found in the care plan section)  Progress towards OT goals: Progressing toward goals  Acute Rehab OT Goals Patient Stated Goal: get better OT Goal Formulation: With patient Time For Goal Achievement: 04/20/22 Potential to Achieve Goals: Fair ADL Goals Pt Will Perform Lower Body Dressing: with min assist;sit to/from stand Pt Will Transfer to Toilet: with min assist;stand pivot transfer;bedside commode Additional ADL Goal #1: Pt will follow 2 step command within the context of ADL  Plan Discharge plan remains appropriate    Co-evaluation    PT/OT/SLP Co-Evaluation/Treatment: Yes Reason for Co-Treatment: Complexity of the patient's impairments (multi-system involvement);For patient/therapist safety PT goals addressed during session: Mobility/safety with mobility OT goals addressed during session: ADL's and self-care      AM-PAC OT "6 Clicks" Daily Activity     Outcome Measure   Help from another person eating meals?: A Little Help from another person taking care of personal grooming?: A Little Help from another person toileting, which includes using toliet, bedpan, or urinal?: A Lot Help from another person bathing (including washing, rinsing, drying)?: A Lot Help from another person to put on and taking off regular upper body clothing?: A Lot Help from another person to put on and taking off  regular lower body clothing?: A Lot 6 Click Score: 14    End of Session Equipment Utilized During Treatment: Gait belt;Other (comment) Charlaine Dalton)  OT Visit Diagnosis: Unsteadiness on feet (R26.81);Muscle weakness (generalized) (M62.81);Other symptoms and signs involving cognitive function;Pain Pain - Right/Left: Right Pain - part of body: Knee   Activity Tolerance Patient tolerated treatment well   Patient Left in chair;with call bell/phone within reach;with family/visitor present;with nursing/sitter in room    Nurse Communication Mobility status;Need for lift equipment        Time: 1003-1035 OT Time Calculation (min): 32 min  Charges: OT General Charges $OT Visit: 1 Visit OT Treatments $Self Care/Home Management : 8-22 mins  Lodema Hong, [place]  Office [place] 04/09/2022, 1:56 PM

## 2022-04-09 NOTE — Progress Notes (Signed)
[facility] Progress Note Patient Name: [name] DOB: 06/19/1956 MRN: [id number]   Date of Service  04/09/2022  HPI/Events of Note  Nursing concerned about confusion and facial droop. Patient opens eyes to verbal command, follow commands and moves all extremities. Very sleepy and falls back asleep without stimulation. Head CT ordered by neurology reveals a stable head CT.  No acute or reversible finding.    eICU Interventions  Continue present management.      Intervention Category Major Interventions: Change in mental status - evaluation and management  Merian Wroe [place] 04/09/2022, 5:11 AM

## 2022-04-09 NOTE — Progress Notes (Signed)
Pt has PRN Bipap orders. No distress noted at this time.
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
## 2022-04-09 NOTE — Progress Notes (Signed)
Upon assessment at 0400 PT was noted to have stroke like symptoms- right sided facial droop, right sided weakness, slurred speech. LKW 0130. Rapid was consulted. See Rapid response note.

## 2022-04-09 NOTE — Progress Notes (Signed)
Physical Therapy Treatment Patient Details Name: Randall Mccormick MRN: [id number] DOB: 02-14-1956 Today's Date: 04/09/2022   History of Present Illness 66 y/o person with recent right total knee arthroplasty on 03/28/22 who presented 04/05/22 to the ED from [place] [facility] after having somnolence, encephalopathy, dysarthria, and lower extremity weakness. Head CT negative; EEG negativve;  Pt has PMHx:  urinary retention, incomplete paraplegia, spasticity, wheelchair dependence but could walk short distances last admit, thoracic spondylosis, neurogenic bladder, neuropathic pain, chronic pain, anxiety, gout, HTN, L rib fractures    PT Comments    Pt admitted with above diagnosis. Pt was able to stand to [facility] with mod assist of 2.  Pt moved to recliner in [place].  Pt performing exercises as well. Pt did much better today overall and tolerating incr activity.  Pt currently with functional limitations due to balance and endurance deficits. Pt will benefit from skilled PT to increase their independence and safety with mobility to allow discharge to the venue listed below.      Recommendations for follow up therapy are one component of a multi-disciplinary discharge planning process, led by the attending physician.  Recommendations may be updated based on patient status, additional functional criteria and insurance authorization.  Follow Up Recommendations  Skilled nursing-short term rehab (<3 hours/day) Can patient physically be transported by private vehicle: No   Assistance Recommended at Discharge Frequent or constant Supervision/Assistance  Patient can return home with the following Assistance with cooking/housework;Assist for transportation;Help with stairs or ramp for entrance;Two people to help with walking and/or transfers;Assistance with feeding;Direct supervision/assist for medications management;Direct supervision/assist for financial management   Equipment Recommendations  None  recommended by PT    Recommendations for Other Services       Precautions / Restrictions Precautions Precautions: Fall Precaution Comments: total knee precautions Restrictions Weight Bearing Restrictions: Yes RLE Weight Bearing: Weight bearing as tolerated     Mobility  Bed Mobility Overal bed mobility: Needs Assistance Bed Mobility: Supine to Sit Rolling: Min assist Sidelying to sit: Min assist       General bed mobility comments: Pt needed assist for LEs and elevation  of trunk to EOB with incr time needed and use of pad. Incr time to scoot pt to EOB.    Transfers Overall transfer level: Needs assistance Equipment used: Ambulation equipment used Transfers: Sit to/from Stand Sit to Stand: Mod assist, +2 physical assistance, From elevated surface           General transfer comment: Pt needed mod assist to stand to [facility] with cues for hand placement. Also needed assist to power up.   Pt not standing fully uprighht needing cues for hip and trunk extension to get flaps of Stedy in place.  Pt moved to the chair and stood again with PT and OT cleaning pt as he had slight BM and his Karle Barr fit had leaked as well. Nurse made aware of blood when wiping pts bottom. Transfer via Lift Equipment: Stedy  Ambulation/Gait                   Stairs             Wheelchair Mobility    Modified Rankin (Stroke Patients Only)       Balance Overall balance assessment: Needs assistance Sitting-balance support: Feet supported, Bilateral upper extremity supported, No upper extremity supported Sitting balance-Leahy Scale: Poor Sitting balance - Comments: Pt sat EOB x 5 min initially needing min assist progressing to min guard assist  Standing balance support: Bilateral upper extremity supported, During functional activity Standing balance-Leahy Scale: Poor Standing balance comment: Pt needed +2 mod assist to stand statically with Stedy for up to 2 min. Fatigues quickly  and cannot stand fully upright.                            Cognition Arousal/Alertness: Awake/alert Behavior During Therapy: WFL for tasks assessed/performed Overall Cognitive Status: Impaired/Different from baseline Area of Impairment: Orientation, Attention, Memory, Following commands, Awareness, Problem solving                 Orientation Level: Time Current Attention Level: Sustained Memory: Decreased recall of precautions Following Commands: Follows one step commands consistently, Follows one step commands with increased time   Awareness: Intellectual Problem Solving: Slow processing, Decreased initiation, Requires verbal cues, Requires tactile cues          Exercises Total Joint Exercises Ankle Circles/Pumps: AROM, Both, 10 reps, Supine Quad Sets: AROM, Right, 10 reps Heel Slides: AAROM, Both, 10 reps, Supine Straight Leg Raises: AAROM, Right, 5 reps, Supine Long Arc Quad: AAROM, Right, Seated, Both, 10 reps    General Comments General comments (skin integrity, edema, etc.): VSS      Pertinent Vitals/Pain Pain Assessment Pain Assessment: Faces Faces Pain Scale: Hurts whole lot Pain Location: R knee Pain Descriptors / Indicators: Grimacing, Guarding Pain Intervention(s): Limited activity within patient's tolerance, Monitored during session, Premedicated before session, Repositioned    Home Living                          Prior Function            PT Goals (current goals can now be found in the care plan section) Acute Rehab PT Goals Patient Stated Goal: to get home sooner Progress towards PT goals: Progressing toward goals    Frequency    Min 3X/week      PT Plan Current plan remains appropriate    Co-evaluation PT/OT/SLP Co-Evaluation/Treatment: Yes Reason for Co-Treatment: Complexity of the patient's impairments (multi-system involvement);For patient/therapist safety PT goals addressed during session: Mobility/safety  with mobility        AM-PAC PT "6 Clicks" Mobility   Outcome Measure  Help needed turning from your back to your side while in a flat bed without using bedrails?: A Little Help needed moving from lying on your back to sitting on the side of a flat bed without using bedrails?: A Little Help needed moving to and from a bed to a chair (including a wheelchair)?: Total Help needed standing up from a chair using your arms (e.g., wheelchair or bedside chair)?: Total Help needed to walk in hospital room?: Total Help needed climbing 3-5 steps with a railing? : Total 6 Click Score: 10    End of Session Equipment Utilized During Treatment: Gait belt Activity Tolerance: Patient limited by fatigue Patient left: with call bell/phone within reach;in chair;with family/visitor present Nurse Communication: Mobility status;Need for lift equipment PT Visit Diagnosis: Unsteadiness on feet (R26.81);Muscle weakness (generalized) (M62.81);Pain Pain - Right/Left: Right Pain - part of body: Knee     Time: 1003-1035 PT Time Calculation (min) (ACUTE ONLY): 32 min  Charges:  $Therapeutic Activity: 8-22 mins                     [facility] M,PT Acute Rehab Services [phone]    Alvira Philips 04/09/2022, 12:27 PM

## 2022-04-09 NOTE — Progress Notes (Signed)
Patient transferred back to bed using steady and placed in CMP.

## 2022-04-10 ENCOUNTER — Telehealth: Payer: Self-pay | Admitting: Orthopedic Surgery

## 2022-04-10 DIAGNOSIS — J9601 Acute respiratory failure with hypoxia: Secondary | ICD-10-CM

## 2022-04-10 DIAGNOSIS — Z87891 Personal history of nicotine dependence: Secondary | ICD-10-CM

## 2022-04-10 LAB — BASIC METABOLIC PANEL
Anion gap: 7 (ref 5–15)
BUN: 8 mg/dL (ref 8–23)
CO2: 25 mmol/L (ref 22–32)
Calcium: 8.4 mg/dL — ABNORMAL LOW (ref 8.9–10.3)
Chloride: 108 mmol/L (ref 98–111)
Creatinine, Ser: 0.71 mg/dL (ref 0.61–1.24)
GFR, Estimated: >60 mL/min (ref >60)
Glucose, Bld: 95 mg/dL (ref 70–99)
Potassium: 4.1 mmol/L (ref 3.5–5.1)
Sodium: 140 mmol/L (ref 135–145)

## 2022-04-10 LAB — MAGNESIUM: Magnesium: 2.1 mg/dL (ref 1.7–2.4)

## 2022-04-10 LAB — CBC
HCT: 23.7 % — ABNORMAL LOW (ref 39.0–52.0)
Hemoglobin: 7.7 g/dL — ABNORMAL LOW (ref 13.0–17.0)
MCH: 33.5 pg (ref 26.0–34.0)
MCHC: 32.5 g/dL (ref 30.0–36.0)
MCV: 103 fL — ABNORMAL HIGH (ref 80.0–100.0)
Platelets: 262 10*3/uL (ref 150–400)
RBC: 2.3 MIL/uL — ABNORMAL LOW (ref 4.22–5.81)
RDW: 16.4 % — ABNORMAL HIGH (ref 11.5–15.5)
WBC: 13.3 10*3/uL — ABNORMAL HIGH (ref 4.0–10.5)
nRBC: 0.2 % (ref 0.0–0.2)

## 2022-04-10 LAB — CULTURE, BLOOD (ROUTINE X 2): Culture: NO GROWTH

## 2022-04-10 LAB — HEMOGLOBIN AND HEMATOCRIT, BLOOD
HCT: 27.6 % — ABNORMAL LOW (ref 39.0–52.0)
Hemoglobin: 9.2 g/dL — ABNORMAL LOW (ref 13.0–17.0)

## 2022-04-10 LAB — PHOSPHORUS: Phosphorus: 2.7 mg/dL (ref 2.5–4.6)

## 2022-04-10 MED ORDER — SENNOSIDES-DOCUSATE SODIUM 8.6-50 MG PO TABS
1.0000 | ORAL_TABLET | Freq: Two times a day (BID) | ORAL | Status: DC
  Administered 2022-04-10 – 2022-04-12 (×5): 1 via ORAL
  Filled 2022-04-10 (×5): qty 1

## 2022-04-10 MED ORDER — FUROSEMIDE 10 MG/ML IJ SOLN
40.0000 mg | Freq: Once | INTRAMUSCULAR | Status: AC
  Administered 2022-04-10: 40 mg via INTRAVENOUS
  Filled 2022-04-10: qty 4

## 2022-04-10 NOTE — Care Management Important Message (Signed)
Important Message  Patient Details  Name: Randall Mccormick MRN: [id number] Date of Birth: 10-03-1955   Medicare Important Message Given:  Yes     Orbie Pyo 04/10/2022, 3:04 PM

## 2022-04-10 NOTE — Progress Notes (Addendum)
ICU Course: Randall Mccormick is a 66 year old person living with HTN, gouty arthritis, anxiety/depression, R knee OA s/p TKR (03/2022), incomplete paraplegia/thoracic myelopathy, chronic back pain s/p multiple lumbar surgeries who presented with confusion, dysarthria, and myoclonic jerks admitted for acute metabolic encephalopathy. Day after admission, he experienced an acute decline with labile BP and acute drop in hgb with coffee ground emesis, requiring ICU admission. He was intubated 10/14 and GI performed an EGD which revealed nonbleeding duodenal ulcer and otherwise normal findings. He was initiated on protonix BID. His Hgb has since remained stable. In terms of his acute encephalopathy, he has had negative EEG and head imaging, no clear cause determined as of yet. He also experienced acute hypoxic respiratory failure with possible LLL opacity on imaging, but has since been extubated (10/15) and is currently saturating well on RA.   Subjective:   No acute overnight events. Evaluated patient at bedside.  Randall Mccormick reports feeling better today, he feels like he is at his usual baseline now. He is happy to be out of the ICU. He denies any pain, including back pain. The reduced doses of his usual medications are still working well for him at this time. He is not feeling very hungry, but would like an Ensure for now. He is passing some flatus, but no BM over the past couple of days. Feels like he is somewhat weaker and would like to continue working with PT. No concerns/complaints at this time.  Objective:  Vital signs in last 24 hours: Vitals:   04/09/22 1643 04/09/22 1951 04/09/22 2334 04/10/22 0313  BP: (!) 124/58 (!) 107/54 (!) 109/55 (!) 112/56  Pulse: 97 89 88 79  Resp:  '16 17 17  '$ Temp: 97.6 F (36.4 C) 97.6 F (36.4 C) 97.7 F (36.5 C) 97.8 F (36.6 C)  TempSrc: Oral Oral Oral Oral  SpO2: 97% 96% 90% 96%  Weight:      Height:       General: pleasant elderly gentleman, laying in  semi-recumbent position in bed, NAD. Eyes: PERRL, EOMI, no scleral icterus noted. CV: normal rate and regular rhythm, no m/r/g. Pulm: CTABL, no adventitious sounds noted. Saturating >95% on RA. Abdomen: soft, nondistended, nontender to palpation. Normoactive bowel sounds. Extremities: warm and well perfused. 2-3+ edema in bilateral UE, trace pitting edema in bilateral LE. Neuro: AAOx4 (person, place, time, president), no focal deficits. Moving all extremities spontaneously, following commands appropriately. Psych: appropriate mood and affect. Calm and cooperative.    Assessment/Plan:  Principal Problem:   Acute encephalopathy Active Problems:   Degeneration of lumbar or lumbosacral intervertebral disc   Hypertension   Constipation   Chronic pain syndrome   Urinary retention   Arthritis of knee   Septic shock (HCC)   AKI (acute kidney injury) (HCC)   Hyperkalemia   Acute blood loss anemia   Hematemesis with nausea   Uremia   Acute on chronic anemia   Acute respiratory failure with hypoxia (HCC)   Pressure injury of skin  Acute Encephalopathy, likely metabolic - improved Unclear etiology of patient's encephalopathy. He has had negative head imaging and negative EEG while here. Has had several episodes of confusion/somnolence and possible facial droop without clear etiology determined. Neurology has signed off. Initially concern that encephalopathy was induced by multiple centrally acting medications. These were initially held, but restarted at reduced dose yesterday per PCCM. His encephalopathy does appear to have improved, he is alert and oriented to person, place, time, and president on my exam. Will  continue to monitor for any neurological changes. -centrally acting medications at reduced doses (see below) -delirium precautions -continue to monitor for any acute neurological changes  Shock state (presumed septic) - Resolved Acute respiratory failure with hypoxia with LLL  opacity Concern for aspiration pneumonia Lactic acidosis - resolved He required intubation 10/14 but was able to be extubated 10/15 and is currently saturating well on RA. Was initiated on cefepime and flagyl, but transitioned to unasyn 10/17 to be completed on 10/20 (total 7 day course). MRSA PCR and RVP both negative. Will need to continue following blood cultures, fever curve (afebrile since arrival), and WBC curve (leukocytosis improving). His CVC has been removed. Off of vasopressor support. -continue unasyn -blood cultures - 1 set finalized negative, 2nd set no growth for past 4 days -few candida albicans on respiratory culture, likely not contributing to current presentation -trend fever and WBC curve  Acute on Chronic Anemia He did have an episode of coffee ground emesis with acute drop in Hgb, but EGD with no findings to explain drop, only showing nonbleeding duodenal ulcer. He is on PPI BID per GI. Hgb dropped from 8.6 > 7.7 this AM, but no overt signs of bleeding noted. No hematochezia or melena noted. On chart review, appears that Hgb has some lability (9.1 > 8.1 > 7.9 > 8.6 > 7.7). Will continue to monitor for any acute bleeding events and trend hemoglobin curve. DVT ppx was resumed yesterday with lovenox per PCCM. Plan to repeat H&H this afternoon. -repeat H&H '@1700'$  -PO PPI BID -trend CBC -lovenox resumed for dvt ppx yesterday -transfuse if hgb <7 and/or hemodynamic instability  Edema He has bilateral UE edema, wonder if this is from IV infiltration/extravasation. No tenderness to either arms. He also has trace pitting edema in bilateral LE. Will apply warm compress to UE. BP has been low-normal since yesterday so will avoid diuresis at this time. -warm compresses and elevation of upper arms as tolerated -hold diuresis given low-normal BP  Constipation Per patient and wife, no BM for about 2 weeks prior to admission. CT abd/pel with mildly prominent stool burden especially in  rectum. Last BM 10/16. -senna-s BID -dulcolax suppository '10mg'$  rectally daily  Chronic back pain Thoracic stenosis with myelopathy S/p T10 pelvic decompression fracture S/p posterior thoracic fusion with laminectomy and arthrodesis to T8 S/p spinal cord stimulator with subsequent removal Hx of neurogenic bladder Hx of neurogenic bowel He is on multiple medications for management, including xanax prn, gabapentin, trazodone, baclofen, and oxycodone prn. These were thought to be contributing to acute encephalopathic state and thus were initially held. They have been restarted at reduced doses per PCCM. -xanax 0.'5mg'$  TID prn -baclofen '5mg'$  TID -gabapentin '300mg'$  TID -lidocaine patch -tylenol 1g q8 prn -oxycodone '10mg'$  q4 prn -external catheter in place, continued home flomax qhs  OA s/p TKR (03/2022) Imaging of surgical site with no concern for infection. -voltaren gel QID -continue working with PT/OT  Anxiety/Depression Mood Disorder -xanax TID prn -trazodone qhs -continue home zyprexa qhs  AKI - resolved  Prior to Admission Living Arrangement: [facility] Anticipated Discharge Location: TBD (PT/OT recommending SNF) Barriers to Discharge: continued medical management Dispo: Anticipated discharge in approximately 2-3 day(s).   Virl Axe, MD 04/10/2022, 7:14 AM Pager: [phone] After 5pm on weekdays and 1pm on weekends: On Call pager [phone]

## 2022-04-10 NOTE — NC FL2 (Signed)
[place] LEVEL OF CARE SCREENING TOOL     IDENTIFICATION  Patient Name: Randall Mccormick Birthdate: [birth year] Sex: male Admission Date (Current Location): 04/05/2022  [facility] and [place] Number:  Herbalist and Address:  [facility] [place] [place], [place] [address], [place], [place] [address]      Provider Number: [id number]  Attending Physician Name and Address:  Aldine Contes, MD  Relative Name and Phone Number:       Current Level of Care: Hospital Recommended Level of Care: [place] Prior Approval Number:    Date Approved/Denied:   PASRR Number: [id number]  Discharge Plan: SNF    Current Diagnoses: Patient Active Problem List   Diagnosis Date Noted   Pressure injury of skin 04/09/2022   Uremia 04/07/2022   Acute on chronic anemia 04/07/2022   Acute respiratory failure with hypoxia ([place]) 04/07/2022   Septic shock ([place])    AKI (acute kidney injury) ([place])    Hyperkalemia    Acute blood loss anemia    Hematemesis with nausea    Acute encephalopathy 04/05/2022   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 ([place]) 03/29/2022   OA (osteoarthritis) of knee 03/28/2022   Arthritis of knee 03/28/2022   Abnormal gait due to muscle weakness 03/28/2021   Urinary retention 03/28/2021   Incomplete paraplegia ([place]) 09/08/2020   Spasticity 09/08/2020   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/08/2020   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/2020   Hypoalbuminemia due to protein-calorie malnutrition (HCC)    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/2020   Constipation    Neurogenic bowel    Neurogenic bladder    Neuropathic pain    Chronic pain syndrome    Anxiety    Cobalamin deficiency    Degeneration of lumbar or lumbosacral intervertebral disc    Hemorrhoids    Hyperlipidemia    Hypertension    Depression    Gouty arthropathy, unspecified    Vitamin D deficiency    Multiple fractures of ribs of left side 04/20/2013     Orientation RESPIRATION BLADDER Height & Weight     Self, Time, Situation, Place  O2 (Nasal Cannula) Incontinent, External catheter Weight: 242 lb 4.6 oz (109.9 kg) Height:  '5\' 7"'$  (170.2 cm) (stated)  BEHAVIORAL SYMPTOMS/MOOD NEUROLOGICAL BOWEL NUTRITION STATUS      Continent Diet (See discharge summary.)  AMBULATORY STATUS COMMUNICATION OF NEEDS Skin   Extensive Assist Verbally Other (Comment), PU Stage and Appropriate Care, Skin abrasions (Irritant dermatitis of the groin) PU Stage 1 Dressing: TID                     Personal Care Assistance Level of Assistance  Bathing, Feeding, Dressing Bathing Assistance: Maximum assistance Feeding assistance: Maximum assistance Dressing Assistance: Maximum assistance     Functional Limitations Info  Sight, Hearing Sight Info: Impaired Hearing Info: Adequate      SPECIAL CARE FACTORS FREQUENCY  PT (By licensed PT), OT (By licensed OT)     PT Frequency: 5x/week OT Frequency: 5x/week            Contractures Contractures Info: Not present    Additional Factors Info  Code Status, Allergies Code Status Info: Full code. Allergies Info: No known allergies.           Current Medications (04/10/2022):  This is the current hospital active medication list Current Facility-Administered Medications  Medication Dose Route Frequency Provider Last Rate Last Admin   0.9 %  sodium chloride  infusion  250 mL Intravenous Continuous Omar Person, NP 10 mL/hr at 04/09/22 0200 Infusion Verify at 04/09/22 0200   acetaminophen (TYLENOL) tablet 1,000 mg  1,000 mg Oral Q8H PRN Omar Person, NP   1,000 mg at 04/08/22 1609   allopurinol (ZYLOPRIM) tablet 300 mg  300 mg Oral Daily Omar Person, NP   300 mg at 04/10/22 0840   ALPRAZolam (XANAX) tablet 0.5 mg  0.5 mg Oral TID PRN Omar Person, NP   0.5 mg at 04/08/22 0804   Ampicillin-Sulbactam (UNASYN) 3 g in sodium chloride 0.9 % 100 mL IVPB  3 g Intravenous Q6H  Omar Person, NP 200 mL/hr at 04/10/22 1100 3 g at 04/10/22 1100   aspirin chewable tablet 81 mg  81 mg Oral BID Omar Person, NP   81 mg at 04/10/22 0839   baclofen (LIORESAL) tablet 5 mg  5 mg Oral TID Omar Person, NP   5 mg at 04/10/22 6503   diclofenac Sodium (VOLTAREN) 1 % topical gel 2 g  2 g Topical QID Omar Person, NP   2 g at 04/10/22 0841   enoxaparin (LOVENOX) injection 40 mg  40 mg Subcutaneous Q24H Hayden Pedro M, NP   40 mg at 04/10/22 1427   feeding supplement (ENSURE ENLIVE / ENSURE PLUS) liquid 237 mL  237 mL Oral TID BM Omar Person, NP   237 mL at 04/10/22 1428   gabapentin (NEURONTIN) capsule 300 mg  300 mg Oral TID Omar Person, NP   300 mg at 04/10/22 0839   lidocaine (LIDODERM) 5 % 1 patch  1 patch Transdermal Q24H Omar Person, NP   1 patch at 04/09/22 2058   multivitamin with minerals tablet 1 tablet  1 tablet Oral Daily Omar Person, NP   1 tablet at 04/10/22 0839   OLANZapine (ZYPREXA) tablet 10 mg  10 mg Oral QHS Omar Person, NP   10 mg at 04/09/22 2058   oxyCODONE (Oxy IR/ROXICODONE) immediate release tablet 10 mg  10 mg Oral Q4H PRN Omar Person, NP       pantoprazole (PROTONIX) EC tablet 40 mg  40 mg Oral BID Aldine Contes, MD   40 mg at 04/10/22 0839   senna-docusate (Senokot-S) tablet 1 tablet  1 tablet Oral BID Virl Axe, MD   1 tablet at 04/10/22 0840   sodium chloride flush (NS) 0.9 % injection 10-40 mL  10-40 mL Intracatheter Q12H Omar Person, NP   10 mL at 04/10/22 0840   sodium chloride flush (NS) 0.9 % injection 10-40 mL  10-40 mL Intracatheter PRN Omar Person, NP   10 mL at 04/08/22 0956   tamsulosin (FLOMAX) capsule 0.4 mg  0.4 mg Oral QPM Hayden Pedro M, NP   0.4 mg at 04/09/22 1734   traZODone (DESYREL) tablet 100 mg  100 mg Oral QHS Omar Person, NP   100 mg at 04/09/22 2058     Discharge Medications: Please see discharge summary for a  list of discharge medications.  Relevant Imaging Results:  Relevant Lab Results:   Additional Information SSN: [id number]  Barton Fanny, Student-Social Work

## 2022-04-10 NOTE — Progress Notes (Signed)
Transition of Care Department [facility]) following patient for high risk of readmission.   Patient was discharged 04/01/22 to [facility] for rehab. Patient had right total knee arthoplasty on 03/28/22. Patient readmitted for confusion.   Transition of Care Department [facility]) has reviewed patient and we will continue to monitor patient advancement through interdisciplinary progression rounds.

## 2022-04-10 NOTE — TOC Initial Note (Signed)
Transition of Care [facility]) - Initial/Assessment Note    Patient Details  Name: Randall Mccormick MRN: [id number] Date of Birth: 03/01/1956  Transition of Care [facility]) CM/SW Contact:    Benard Halsted, LCSW Phone Number: 04/10/2022, 1:47 PM  Clinical Narrative:                 CSW met with patient's spouse and daughter at bedside. They are requesting patient return to [place] for rehab at discharge. Daughter requested CSW follow up with physical therapy to see patient and use the CPM for his knee. They are in agreement with [facility] for transport.   CSW spoke with Olivia Mackie at [employer] and she confirmed patient can return when stable. CSW spoke with the PT office and they will have patient on the schedule to be seen tomorrow. CSW requested ortho tech to assist patient with CPM. Updated daughter.   Expected Discharge Plan: Skilled Nursing Facility Barriers to Discharge: Continued Medical Work up   Patient Goals and CMS Choice Patient states their goals for this hospitalization and ongoing recovery are:: Rehab CMS Medicare.gov Compare Post Acute Care list provided to:: Patient Represenative (must comment) Choice offered to / list presented to : Spouse, Adult Children  Expected Discharge Plan and Services Expected Discharge Plan: [place] In-house Referral: Clinical Social Work   Post Acute Care Choice: [place] Living arrangements for the past 2 months: [place]                                      Prior Living Arrangements/Services Living arrangements for the past 2 months: Single Family Home Lives with:: Spouse Patient language and need for interpreter reviewed:: Yes Do you feel safe going back to the place where you live?: Yes      Need for Family Participation in Patient Care: Yes (Comment) Care giver support system in place?: Yes (comment)   Criminal Activity/Legal Involvement Pertinent to Current Situation/Hospitalization:  No - Comment as needed  Activities of Daily Living   ADL Screening (condition at time of admission) Patient's cognitive ability adequate to safely complete daily activities?: Yes Is the patient deaf or have difficulty hearing?: No Does the patient have difficulty seeing, even when wearing glasses/contacts?: No Does the patient have difficulty concentrating, remembering, or making decisions?: No Patient able to express need for assistance with ADLs?: Yes Does the patient have difficulty dressing or bathing?: Yes Communication: Independent Dressing (OT): Needs assistance Grooming: Independent Feeding: Independent Bathing: Needs assistance Toileting: Independent Does the patient have difficulty walking or climbing stairs?: Yes Weakness of Legs: Both Weakness of Arms/Hands: None  Permission Sought/Granted Permission sought to share information with : Facility Sport and exercise psychologist, Family Supports    Share Information with NAME: Jennifer/Lynn  Permission granted to share info w AGENCY: [facility]  Permission granted to share info w Relationship: Daughter/Spouse  Permission granted to share info w Contact Information: [phone]  Emotional Assessment Appearance:: Appears stated age     Orientation: : Oriented to Self, Oriented to Place, Oriented to  Time, Oriented to Situation (some confusion) Alcohol / Substance Use: Not Applicable Psych Involvement: No (comment)  Admission diagnosis:  Hyperkalemia [E87.5] Jerking [R25.3] Acute encephalopathy [G93.40] AKI (acute kidney injury) ([place]) [N17.9] Altered mental status, unspecified altered mental status type [R41.82] Pneumonia of left lower lobe due to infectious organism [J18.9] Patient Active Problem List   Diagnosis Date Noted  Pressure injury of skin 04/09/2022   Uremia 04/07/2022   Acute on chronic anemia 04/07/2022   Acute respiratory failure with hypoxia ([place]) 04/07/2022   Septic shock ([place])    AKI (acute kidney injury)  ([place])    Hyperkalemia    Acute blood loss anemia    Hematemesis with nausea    Acute encephalopathy 04/05/2022   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 ([place]) 03/29/2022   OA (osteoarthritis) of knee 03/28/2022   Arthritis of knee 03/28/2022   Abnormal gait due to muscle weakness 03/28/2021   Urinary retention 03/28/2021   Incomplete paraplegia ([place]) 09/08/2020   Spasticity 09/08/2020   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/08/2020   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/2020   Hypoalbuminemia due to protein-calorie malnutrition [facility])    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/2020   Constipation    Neurogenic bowel    Neurogenic bladder    Neuropathic pain    Chronic pain syndrome    Anxiety    Cobalamin deficiency    Degeneration of lumbar or lumbosacral intervertebral disc    Hemorrhoids    Hyperlipidemia    Hypertension    Depression    Gouty arthropathy, unspecified    Vitamin D deficiency    Multiple fractures of ribs of left side 04/20/2013   PCP:  [name], MD Pharmacy:   [place], [place] [address] Phone: [phone] Fax: [phone]  [facility] [place], [place] [address] Phone: [phone] Fax: [phone]     Social Determinants of Health (SDOH) Interventions    Readmission Risk Interventions     No data to display

## 2022-04-10 NOTE — Progress Notes (Signed)
Orthopedic Tech Progress Note Patient Details:  Randall Mccormick Jul 15, 1955 [id number]  1429 I went to apply CPM to patient. Patient ID: Randall Mccormick, male   DOB: 09-Apr-1956, 66 y.o.   MRN: [id number]  Randall Mccormick 04/10/2022, 3:21 PM

## 2022-04-10 NOTE — Telephone Encounter (Signed)
Patient daughter called and stated her father is in the hospital and she spoke to social worker in hospital and the social worker said she arranged for Dr Marlou Sa to see him in the hospital and she wants to confrim this visit but is not sure of it is Dr Marlou Sa or not that is supposed to be coming to see her father. The best contact number [phone]

## 2022-04-11 LAB — BASIC METABOLIC PANEL
Anion gap: 8 (ref 5–15)
BUN: 8 mg/dL (ref 8–23)
CO2: 29 mmol/L (ref 22–32)
Calcium: 8.5 mg/dL — ABNORMAL LOW (ref 8.9–10.3)
Chloride: 101 mmol/L (ref 98–111)
Creatinine, Ser: 0.83 mg/dL (ref 0.61–1.24)
GFR, Estimated: >60 mL/min (ref >60)
Glucose, Bld: 115 mg/dL — ABNORMAL HIGH (ref 70–99)
Potassium: 4.1 mmol/L (ref 3.5–5.1)
Sodium: 138 mmol/L (ref 135–145)

## 2022-04-11 LAB — CBC
HCT: 28.9 % — ABNORMAL LOW (ref 39.0–52.0)
Hemoglobin: 9.5 g/dL — ABNORMAL LOW (ref 13.0–17.0)
MCH: 32.9 pg (ref 26.0–34.0)
MCHC: 32.9 g/dL (ref 30.0–36.0)
MCV: 100 fL (ref 80.0–100.0)
Platelets: 347 10*3/uL (ref 150–400)
RBC: 2.89 MIL/uL — ABNORMAL LOW (ref 4.22–5.81)
RDW: 16.2 % — ABNORMAL HIGH (ref 11.5–15.5)
WBC: 17.5 10*3/uL — ABNORMAL HIGH (ref 4.0–10.5)
nRBC: 1.3 % — ABNORMAL HIGH (ref 0.0–0.2)

## 2022-04-11 LAB — MAGNESIUM: Magnesium: 2 mg/dL (ref 1.7–2.4)

## 2022-04-11 LAB — CULTURE, BLOOD (SINGLE)
Culture: NO GROWTH
Special Requests: ADEQUATE

## 2022-04-11 LAB — PHOSPHORUS: Phosphorus: 3.2 mg/dL (ref 2.5–4.6)

## 2022-04-11 MED ORDER — FUROSEMIDE 10 MG/ML IJ SOLN
40.0000 mg | Freq: Once | INTRAMUSCULAR | Status: AC
  Administered 2022-04-11: 40 mg via INTRAVENOUS
  Filled 2022-04-11: qty 4

## 2022-04-11 NOTE — Progress Notes (Signed)
Subjective:   No acute overnight events.  Feeling well. Wife states that he looks great today. Still having some arm swelling, but improved from yesterday. No new complaints or concerns.  Objective:  Vital signs in last 24 hours: Vitals:   04/10/22 2000 04/10/22 2305 04/11/22 0311 04/11/22 0400  BP: 112/72 132/66 125/78 121/70  Pulse:  100 95   Resp:  17 18   Temp:  98.5 F (36.9 C) 98.4 F (36.9 C)   TempSrc:  Oral Oral   SpO2:      Weight:      Height:       General: pleasant elderly gentleman, sitting in bed, NAD. CV: normal rate and regular rhythm, no m/r/g. Pulm: CTABL, no adventitious sounds noted. Saturating >95% on RA. Abdomen: soft, nondistended, nontender, normoactive bowel sounds. MSK: 1-2+ edema in bilateral UE, trace pitting edema in bilateral LE. Neuro: AAOx4, no focal deficits noted. Follows commands, moving all extremities. Psych: calm and cooperative.   Assessment/Plan:  Principal Problem:   Acute encephalopathy Active Problems:   Degeneration of lumbar or lumbosacral intervertebral disc   Hypertension   Constipation   Chronic pain syndrome   Urinary retention   Arthritis of knee   Septic shock (HCC)   AKI (acute kidney injury) (HCC)   Hyperkalemia   Acute blood loss anemia   Hematemesis with nausea   Uremia   Acute on chronic anemia   Acute respiratory failure with hypoxia (HCC)   Pressure injury of skin  Acute Encephalopathy, likely metabolic - Resolved Unclear etiology of patient's initial presenting encephalopathy with negative workup, although possible it was 2/2 being on multiple centrally acting medications at home. His encephalopathy does appear to have resolved with his mentation at baseline per patient and wife. Working on disposition now, will likely go back to [facility] facility. He is medically stable for discharge at this time. -delirium precautions -pending SNF placement, appreciate TOC assistance  Acute respiratory  failure with hypoxia with LLL opacity Concern for aspiration pneumonia Extubated 10/15, continues to saturate well on RA. On unasyn which will be completed on 10/20 for total 7-day course. Afebrile but does have worsening leukocytosis. He clinically appears improved so low concern for worsening infection. Will continue to trend WBC and fever curve. Finalized blood cultures negative. -continue unasyn -trend fever and WBC curve  Acute on Chronic Anemia Stable at this time, Hgb 9.5. No overt signs of bleeding noted and no reported hematochezia or melena on BM today.  -PO PPI BID -trend CBC -lovenox for dvt ppx -transfuse if hgb <7 and/or hemodynamic instability  Edema Likely from IVF resuscitation since admission. His edema has improved with dose of IV lasix yesterday with about 4.6L UOP since yesterday, although edema not completely resolved. Will repeat dose of lasix today. -repeat IV lasix '40mg'$  today  Constipation BM this morning. -senna-s BID -dulcolax suppository '10mg'$  rectally daily  Chronic back pain Thoracic stenosis with myelopathy S/p T10 pelvic decompression fracture S/p posterior thoracic fusion with laminectomy and arthrodesis to T8 S/p spinal cord stimulator with subsequent removal Hx of neurogenic bladder Hx of neurogenic bowel He is on multiple medications for management, including xanax prn, gabapentin, trazodone, baclofen, and oxycodone prn. These were thought to be contributing to acute encephalopathic state and thus were initially held. They have been restarted at reduced doses per PCCM. -xanax 0.'5mg'$  TID prn -baclofen '5mg'$  TID -gabapentin '300mg'$  TID -lidocaine patch -tylenol 1g q8 prn -oxycodone '10mg'$  q4 prn -external catheter in place, continued home  flomax qhs  OA s/p TKR (03/2022) Imaging of surgical site with no concern for infection. -voltaren gel QID -continue working with PT/OT  Anxiety/Depression Mood Disorder -xanax TID prn -trazodone qhs -continue  home zyprexa qhs  Prior to Admission Living Arrangement: [facility] Anticipated Discharge Location: [facility] Barriers to Discharge: medically stable for discharge Dispo: Anticipated discharge in approximately 1 day.   Virl Axe, MD 04/11/2022, 2:52 PM Pager: [phone] After 5pm on weekdays and 1pm on weekends: On Call pager [phone]

## 2022-04-11 NOTE — Progress Notes (Signed)
Occupational Therapy Treatment Patient Details Name: Randall Mccormick MRN: [id number] DOB: 12-Oct-1955 Today's Date: 04/11/2022   History of present illness 66 y/o person with recent right total knee arthroplasty on 03/28/22 who [facility]/05/[facility] after having somnolence, encephalopathy, dysarthria, and lower extremity weakness. Head CT negative; EEG negativve;  Pt has PMHx:  urinary retention, incomplete paraplegia, spasticity, wheelchair dependence but could walk short distances last admit, thoracic spondylosis, neurogenic bladder, neuropathic pain, chronic pain, anxiety, gout, HTN, L rib fractures   OT comments  Patient received in supine and agreeable to OT session. Patient able to get to EOB with min assist and performed grooming seated on EOB. Patient asked to use [facility] and required mod assist to stand and to step pivot to [facility].  Patient required max assist for toilet hygiene with patient's wife stating he required help with toilet hygiene at home. Patient was mod assist to return to supine and assisted with placement of CPM for RLE knee. Patient is making great gains from requiring assistance of 2 to 1 and is motivated toward progress. Acute OT to continue to follow.    Recommendations for follow up therapy are one component of a multi-disciplinary discharge planning process, led by the attending physician.  Recommendations may be updated based on patient status, additional functional criteria and insurance authorization.    Follow Up Recommendations  Skilled nursing-short term rehab (<3 hours/day)    Assistance Recommended at Discharge Frequent or constant Supervision/Assistance  Patient can return home with the following  A lot of help with walking and/or transfers;A lot of help with bathing/dressing/bathroom;Assistance with cooking/housework;Direct supervision/assist for medications management;Assist for transportation;Direct supervision/assist for financial  management;Help with stairs or ramp for entrance   Equipment Recommendations       Recommendations for Other Services      Precautions / Restrictions Precautions Precautions: Fall Precaution Comments: total knee precautions Restrictions Weight Bearing Restrictions: Yes RLE Weight Bearing: Weight bearing as tolerated       Mobility Bed Mobility Overal bed mobility: Needs Assistance Bed Mobility: Supine to Sit, Sit to Supine Rolling: Min assist   Supine to sit: Min assist Sit to supine: Mod assist   General bed mobility comments: mod assist to return to supine due to assistance needed with BLEs    Transfers Overall transfer level: Needs assistance Equipment used: Rolling walker (2 wheels) Transfers: Sit to/from Stand, Bed to chair/wheelchair/BSC Sit to Stand: Mod assist     Step pivot transfers: Mod assist     General transfer comment: Patient instructed on safety and hand placement for transfer to [facility] with RW. Patient required mod assist to power up and transfer     Balance Overall balance assessment: Needs assistance Sitting-balance support: Feet supported, Bilateral upper extremity supported, No upper extremity supported Sitting balance-Leahy Scale: Poor Sitting balance - Comments: able to perform grooming tasks seated on EOB   Standing balance support: Bilateral upper extremity supported, During functional activity Standing balance-Leahy Scale: Poor Standing balance comment: reliant on RW for support                           ADL either performed or assessed with clinical judgement   ADL Overall ADL's : Needs assistance/impaired     Grooming: Wash/dry hands;Wash/dry face;Oral care;Supervision/safety;Sitting Grooming Details (indicate cue type and reason): on EOB                 Toilet Transfer: Moderate  assistance;BSC/3in1;Rolling walker (2 wheels) Toilet Transfer Details (indicate cue type and reason): mod assist to power up and pivot  to [facility] with RW Toileting- Clothing Manipulation and Hygiene: Maximal assistance;Sit to/from stand Toileting - Clothing Manipulation Details (indicate cue type and reason): Patient required max assist for toilet hygiene while standing due to [place] BUE for support       General ADL Comments: improvement with transfers    Extremity/Trunk Assessment              Vision       Perception     Praxis      Cognition Arousal/Alertness: Awake/alert Behavior During Therapy: WFL for tasks assessed/performed Overall Cognitive Status: Impaired/Different from baseline Area of Impairment: Orientation, Attention, Memory, Following commands, Awareness, Problem solving                 Orientation Level: Time Current Attention Level: Sustained   Following Commands: Follows one step commands consistently   Awareness: Intellectual Problem Solving: Slow processing, Decreased initiation, Requires verbal cues, Requires tactile cues General Comments: improvement with following commands        Exercises      Shoulder Instructions       General Comments      Pertinent Vitals/ Pain       Pain Assessment Pain Assessment: Faces Faces Pain Scale: Hurts little more Pain Location: right knee Pain Descriptors / Indicators: Grimacing, Guarding Pain Intervention(s): Limited activity within patient's tolerance, Monitored during session, Repositioned  Home Living                                          Prior Functioning/Environment              Frequency  Min 2X/week        Progress Toward Goals  OT Goals(current goals can now be found in the care plan section)  Progress towards OT goals: Progressing toward goals  Acute Rehab OT Goals Patient Stated Goal: get more rehab in SNF OT Goal Formulation: With patient Time For Goal Achievement: 04/20/22 Potential to Achieve Goals: Fair ADL Goals Pt Will Perform Lower Body Dressing: with min assist;sit  to/from stand Pt Will Transfer to Toilet: with min assist;stand pivot transfer;bedside commode Additional ADL Goal #1: Pt will follow 2 step command within the context of ADL  Plan Discharge plan remains appropriate    Co-evaluation                 AM-PAC OT "6 Clicks" Daily Activity     Outcome Measure   Help from another person eating meals?: A Little Help from another person taking care of personal grooming?: A Little Help from another person toileting, which includes using toliet, bedpan, or urinal?: A Lot Help from another person bathing (including washing, rinsing, drying)?: A Lot Help from another person to put on and taking off regular upper body clothing?: A Lot Help from another person to put on and taking off regular lower body clothing?: A Lot 6 Click Score: 14    End of Session Equipment Utilized During Treatment: Gait belt;Rolling walker (2 wheels)  OT Visit Diagnosis: Unsteadiness on feet (R26.81);Muscle weakness (generalized) (M62.81);Other symptoms and signs involving cognitive function;Pain Pain - Right/Left: Right Pain - part of body: Knee   Activity Tolerance Patient tolerated treatment well   Patient Left in chair;with call bell/phone within reach;with family/visitor present;Other (  comment) (CPM placed for RLE)   Nurse Communication Mobility status;Other (comment) (Placement of CPM)        Time: [phone] OT Time Calculation (min): 34 min  Charges: OT General Charges $OT Visit: 1 Visit OT Treatments $Self Care/Home Management : 23-37 mins  Lodema Hong, [place]  Office [place] 04/11/2022, 1:45 PM

## 2022-04-11 NOTE — Plan of Care (Signed)
  Problem: Activity: Goal: Range of joint motion will improve Outcome: Progressing   Problem: Pain Management: Goal: Pain level will decrease with appropriate interventions Outcome: Progressing   Problem: Skin Integrity: Goal: Will show signs of wound healing Outcome: Progressing

## 2022-04-11 NOTE — Progress Notes (Signed)
Physical Therapy Treatment Patient Details Name: Randall Mccormick MRN: [id number] DOB: 1955/09/28 Today's Date: 04/11/2022   History of Present Illness 66 y/o person with recent right total knee arthroplasty on 03/28/22 who presented 04/05/22 to [facility] ED [facility] after having somnolence, encephalopathy, dysarthria, and lower extremity weakness. Head CT negative; EEG negativve;  Pt has PMHx:  urinary retention, incomplete paraplegia, spasticity, wheelchair dependence but could walk short distances last admit, thoracic spondylosis, neurogenic bladder, neuropathic pain, chronic pain, anxiety, gout, HTN, L rib fractures    PT Comments    Pt received in supine, agreeable to therapy session and with good participation and tolerance for supine and seated therapeutic exercise instruction and transfer/gait training with RW support. Pt reports moderate fatigue 6/10 modified RPE at end of session. Pt needing up to [place] for transfers and short gait trials x2 at bedside, spouse present standing by for safety but only +1 physical assist needed this date. HEP handout brought with info also on TKA precautions to reinforce as pt was unable to recall. Pt continues to benefit from PT services to progress toward functional mobility goals.    Recommendations for follow up therapy are one component of a multi-disciplinary discharge planning process, led by the attending physician.  Recommendations may be updated based on patient status, additional functional criteria and insurance authorization.  Follow Up Recommendations  Skilled nursing-short term rehab (<3 hours/day) Can patient physically be transported by private vehicle: No   Assistance Recommended at Discharge Frequent or constant Supervision/Assistance  Patient can return home with the following Assistance with cooking/housework;Assist for transportation;Help with stairs or ramp for entrance;Two people to help with walking and/or  transfers;Assistance with feeding;Direct supervision/assist for medications management;Direct supervision/assist for financial management   Equipment Recommendations  None recommended by PT    Recommendations for Other Services       Precautions / Restrictions Precautions Precautions: Fall Precaution Comments: total knee precautions-handout given to reinforce Restrictions Weight Bearing Restrictions: Yes RLE Weight Bearing: Weight bearing as tolerated Other Position/Activity Restrictions: no pillow under knee resting -pt needs reinforcement; CPM in room     Mobility  Bed Mobility Overal bed mobility: Needs Assistance Bed Mobility: Supine to Sit     Supine to sit: Min assist     General bed mobility comments: pt agreeable to sit up in recliner at end of session    Transfers Overall transfer level: Needs assistance Equipment used: Rolling walker (2 wheels) Transfers: Sit to/from Stand, Bed to chair/wheelchair/BSC Sit to Stand: Mod assist, From elevated surface   Step pivot transfers: Mod assist       General transfer comment: Patient instructed on safety and hand placement for transfer to recliner, and for standing from recliner height to RW; pillow placed in chair to elevate surface slightly and for pt comfort with longer time seated; reviewed pressure relief strategies.    Ambulation/Gait Ambulation/Gait assistance: Mod assist, +2 safety/equipment Gait Distance (Feet): 12 Feet (~75f, seated break, ~161f(85f48fwd and bwd in front of chair)) Assistive device: Rolling walker (2 wheels) Gait Pattern/deviations: Decreased stride length, Decreased weight shift to right, Step-to pattern, Knee flexed in stance - right, Leaning posteriorly, Trunk flexed Gait velocity: reduced     General Gait Details: cues for activity pacing, pt needing assist for RW management; spouse present providing supervision/assist to manage IV pole for pt safety; forward/backward stepping at bedside  with HR elevated to  127 bpm with exertion; no dizziness reported   Stairs Stairs:  (not yet  safe to attempt)           Wheelchair Mobility    Modified Rankin (Stroke Patients Only)       Balance Overall balance assessment: Needs assistance Sitting-balance support: Feet supported, Bilateral upper extremity supported, No upper extremity supported Sitting balance-Leahy Scale: Poor Sitting balance - Comments: static sitting and weight shifting no LOB   Standing balance support: Bilateral upper extremity supported, During functional activity Standing balance-Leahy Scale: Poor Standing balance comment: reliant on RW for support and external assist x1 for RW and balance assist                            Cognition Arousal/Alertness: Awake/alert Behavior During Therapy: WFL for tasks assessed/performed Overall Cognitive Status: Impaired/Different from baseline Area of Impairment: Orientation, Attention, Memory, Following commands, Awareness, Problem solving                   Current Attention Level: Sustained Memory: Decreased recall of precautions Following Commands: Follows one step commands consistently   Awareness: Emergent Problem Solving: Requires verbal cues General Comments: improvement with following commands, pt with poor recall of TKA precautions, handout printed to reinforce with him and spouse.        Exercises Total Joint Exercises Ankle Circles/Pumps: AROM, Both, 10 reps, Supine Quad Sets: AROM, Right, 5 reps Gluteal Sets: AROM, 5 reps, Supine Heel Slides: AROM, 5 reps, Supine, Both Hip ABduction/ADduction: AAROM, Right, 10 reps, Supine Straight Leg Raises: AAROM, Right, 5 reps, Supine Long Arc Quad: Seated, Both, 10 reps, AROM Goniometric ROM: grossly WFL ~-5* to 85* seated EOB, did not have goniometer on 5W unit    General Comments General comments (skin integrity, edema, etc.): HR 100 bpm resting seated and up to 127 bpm with  exertion      Pertinent Vitals/Pain Pain Assessment Pain Assessment: 0-10 Pain Score: 6  Pain Location: right knee with ambulation and resting Pain Descriptors / Indicators: Grimacing, Guarding Pain Intervention(s): Monitored during session, Repositioned, Other (comment) (pt defers ice pack or pain meds)     PT Goals (current goals can now be found in the care plan section) Acute Rehab PT Goals Patient Stated Goal: to get stronger in therapy before I go home PT Goal Formulation: With patient/family Time For Goal Achievement: 04/20/22 Progress towards PT goals: Progressing toward goals    Frequency    Min 3X/week      PT Plan Current plan remains appropriate       AM-PAC PT "6 Clicks" Mobility   Outcome Measure  Help needed turning from your back to your side while in a flat bed without using bedrails?: A Little Help needed moving from lying on your back to sitting on the side of a flat bed without using bedrails?: A Little Help needed moving to and from a bed to a chair (including a wheelchair)?: A Lot Help needed standing up from a chair using your arms (e.g., wheelchair or bedside chair)?: A Lot Help needed to walk in hospital room?: Total Help needed climbing 3-5 steps with a railing? : Total 6 Click Score: 12    End of Session Equipment Utilized During Treatment: Gait belt Activity Tolerance: Patient tolerated treatment well Patient left: in chair;with call bell/phone within reach;with family/visitor present;Other (comment) (spouse in room) Nurse Communication: Mobility status;Need for lift equipment;Other (comment) (+2 with St[place] vs RW for safety to return) PT Visit Diagnosis: Unsteadiness on feet (R26.81);Muscle weakness (generalized) (M62.81);Pain Pain -  Right/Left: Right Pain - part of body: Knee     Time: 1228-1252 PT Time Calculation (min) (ACUTE ONLY): 24 min  Charges:  $Gait Training: 8-22 mins $Therapeutic Exercise: 8-22 mins                      Joshlynn Alfonzo P., PTA Acute Rehabilitation Services Secure Chat Preferred 9a-5:30pm Office: [place] 04/11/2022, 3:05 PM

## 2022-04-12 ENCOUNTER — Encounter: Payer: MEDICARE | Admitting: Surgical

## 2022-04-12 ENCOUNTER — Encounter: Payer: MEDICARE | Admitting: Orthopedic Surgery

## 2022-04-12 DIAGNOSIS — J690 Pneumonitis due to inhalation of food and vomit: Secondary | ICD-10-CM

## 2022-04-12 HISTORY — DX: Pneumonitis due to inhalation of food and vomit: J69.0

## 2022-04-12 LAB — CBC WITH DIFFERENTIAL/PLATELET
Abs Immature Granulocytes: 0 10*3/uL (ref 0.00–0.07)
Basophils Absolute: 0 10*3/uL (ref 0.0–0.1)
Basophils Relative: 0 %
Eosinophils Absolute: 0 10*3/uL (ref 0.0–0.5)
Eosinophils Relative: 0 %
HCT: 27.9 % — ABNORMAL LOW (ref 39.0–52.0)
Hemoglobin: 9.3 g/dL — ABNORMAL LOW (ref 13.0–17.0)
Lymphocytes Relative: 17 %
Lymphs Abs: 2.7 10*3/uL (ref 0.7–4.0)
MCH: 33.8 pg (ref 26.0–34.0)
MCHC: 33.3 g/dL (ref 30.0–36.0)
MCV: 101.5 fL — ABNORMAL HIGH (ref 80.0–100.0)
Monocytes Absolute: 1.9 10*3/uL — ABNORMAL HIGH (ref 0.1–1.0)
Monocytes Relative: 12 %
Neutro Abs: 11.1 10*3/uL — ABNORMAL HIGH (ref 1.7–7.7)
Neutrophils Relative %: 71 %
Platelets: 400 10*3/uL (ref 150–400)
RBC: 2.75 MIL/uL — ABNORMAL LOW (ref 4.22–5.81)
RDW: 16.4 % — ABNORMAL HIGH (ref 11.5–15.5)
WBC: 15.6 10*3/uL — ABNORMAL HIGH (ref 4.0–10.5)
nRBC: 0 /100 WBC
nRBC: 1.5 % — ABNORMAL HIGH (ref 0.0–0.2)

## 2022-04-12 LAB — BASIC METABOLIC PANEL
Anion gap: 8 (ref 5–15)
BUN: 7 mg/dL — ABNORMAL LOW (ref 8–23)
CO2: 29 mmol/L (ref 22–32)
Calcium: 8.4 mg/dL — ABNORMAL LOW (ref 8.9–10.3)
Chloride: 98 mmol/L (ref 98–111)
Creatinine, Ser: 0.76 mg/dL (ref 0.61–1.24)
GFR, Estimated: >60 mL/min (ref >60)
Glucose, Bld: 110 mg/dL — ABNORMAL HIGH (ref 70–99)
Potassium: 4.4 mmol/L (ref 3.5–5.1)
Sodium: 135 mmol/L (ref 135–145)

## 2022-04-12 MED ORDER — OLANZAPINE 10 MG PO TABS: 10.0000 mg | ORAL_TABLET | Freq: Every day | ORAL | Status: DC

## 2022-04-12 MED ORDER — OXYCODONE HCL 10 MG PO TABS: 10.0000 mg | ORAL_TABLET | ORAL | 0 refills | Status: AC | PRN

## 2022-04-12 MED ORDER — PANTOPRAZOLE SODIUM 40 MG PO TBEC: 40.0000 mg | DELAYED_RELEASE_TABLET | Freq: Two times a day (BID) | ORAL | 0 refills | Status: DC

## 2022-04-12 MED ORDER — RIVAROXABAN 10 MG PO TABS: 10.0000 mg | ORAL_TABLET | ORAL | 0 refills | Status: DC

## 2022-04-12 MED ORDER — ALPRAZOLAM 0.5 MG PO TABS: 0.5000 mg | ORAL_TABLET | Freq: Three times a day (TID) | ORAL | 0 refills | Status: AC | PRN

## 2022-04-12 MED ORDER — BACLOFEN 5 MG PO TABS: 5.0000 mg | ORAL_TABLET | Freq: Three times a day (TID) | ORAL | 0 refills | Status: DC

## 2022-04-12 NOTE — Plan of Care (Signed)
  Problem: Education: Goal: Knowledge of the prescribed therapeutic regimen will improve Outcome: Progressing   Problem: Health Behavior/Discharge Planning: Goal: Ability to manage health-related needs will improve Outcome: Progressing

## 2022-04-12 NOTE — Progress Notes (Signed)
Orthopedic Tech Progress Note Patient Details:  Randall Mccormick 05/16/1956 [id number]  CPM Right Knee CPM Right Knee: On Right Knee Flexion (Degrees): 75 Right Knee Extension (Degrees): 5  Post Interventions Patient Tolerated: Well  Vernona Rieger 04/12/2022, 12:04 PM

## 2022-04-12 NOTE — TOC Transition Note (Signed)
Transition of Care [facility]) - CM/SW Discharge Note   Patient Details  Name: Karson Reede MRN: [id number] Date of Birth: 1956-03-27  Transition of [facility]) CM/SW Contact:  Benard Halsted, LCSW Phone Number: 04/12/2022, 2:11 PM   Clinical Narrative:    Patient will DC to: [facility] Anticipated DC date: 04/12/22 Family notified: Daughter, Spouse Transport by: Corey Harold   Per MD patient ready for DC to [facility]. RN to call report prior to discharge ([phone]). RN, patient, patient's family, and facility notified of DC. Discharge Summary and FL2 sent to facility. DC packet on chart including signed scripts. Ambulance transport requested for patient.   CSW will sign off for now as social work intervention is no longer needed. Please consult [place] again if new needs arise.     Final next level of care: Skilled Nursing Facility Barriers to Discharge: Barriers Resolved   Patient Goals and CMS Choice Patient states their goals for this hospitalization and ongoing recovery are:: Rehab CMS Medicare.gov Compare Post Acute Care list provided to:: Patient Represenative (must comment) Choice offered to / list presented to : Spouse, Adult Children  Discharge Placement   Existing PASRR number confirmed : 04/12/22          Patient chooses bed at: [facility] Patient to be transferred to facility by: [place] Name of family member notified: Wife Patient and family notified of of transfer: 04/12/22  Discharge Plan and Services In-house Referral: Clinical Social Work   Post Acute Care Choice: [place]                               Social Determinants of Health (SDOH) Interventions     Readmission Risk Interventions     No data to display

## 2022-04-12 NOTE — TOC Progression Note (Signed)
Transition of Care [facility]) - Progression Note    Patient Details  Name: Davonte Siebenaler MRN: [id number] Date of Birth: 1956-06-22  [facility] CM/SW [place], LCSW Phone Number: 04/12/2022, 10:23 AM  Clinical Narrative:    Per MD, patient likely stable for discharge today. [facility] aware. Updated patient's spouse and daughter.    Expected Discharge Plan: [place] Barriers to Discharge: Barriers Resolved  Expected Discharge Plan and Services Expected Discharge Plan: [place] In-house Referral: Clinical Social Work   Post Acute Care Choice: [place] Living arrangements for the past 2 months: Single Family Home                                       Social Determinants of Health (SDOH) Interventions    Readmission Risk Interventions     No data to display

## 2022-04-12 NOTE — Discharge Summary (Signed)
Name: Randall Mccormick MRN: [id number] DOB: [birth year] 66 y.o. PCP: [name], MD  Date of Admission: 04/05/2022  8:50 AM Date of Discharge: 04/12/22 Attending Physician: Aldine Contes, MD  Discharge Diagnosis: 1. Principal Problem:   Acute encephalopathy Active Problems:   Degeneration of lumbar or lumbosacral intervertebral disc   Hypertension   Constipation   Chronic pain syndrome   Urinary retention   Arthritis of knee   Septic shock (HCC)   AKI (acute kidney injury) (HCC)   Hyperkalemia   Acute blood loss anemia   Hematemesis with nausea   Uremia   Acute respiratory failure with hypoxia (HCC)   Pressure injury of skin   Aspiration pneumonia [facility])   Discharge Medications: Allergies as of 04/12/2022   No Known Allergies      Medication List     STOP taking these medications    lisinopril 40 MG tablet Commonly known as: ZESTRIL   polyethylene glycol 17 g packet Commonly known as: MIRALAX / GLYCOLAX   senna 8.6 MG tablet Commonly known as: SENOKOT       TAKE these medications    acetaminophen 325 MG tablet Commonly known as: TYLENOL Take 975 mg by mouth every 8 (eight) hours as needed for moderate pain.   allopurinol 300 MG tablet Commonly known as: ZYLOPRIM Take 300 mg by mouth daily.   ALPRAZolam 0.5 MG tablet Commonly known as: XANAX Take 1 tablet (0.5 mg total) by mouth every 8 (eight) hours as needed for up to 15 days for anxiety.   aspirin 81 MG chewable tablet Chew 1 tablet (81 mg total) by mouth 2 (two) times daily.   Baclofen 5 MG Tabs Take 5 mg by mouth 3 (three) times daily. What changed:  medication strength how much to take when to take this additional instructions   bisacodyl 5 MG EC tablet Commonly known as: DULCOLAX Take 10 mg by mouth daily as needed for moderate constipation.   ezetimibe 10 MG tablet Commonly known as: ZETIA Take 10 mg by mouth at bedtime.   gabapentin 300 MG capsule Commonly known  as: NEURONTIN Take 1 capsule (300 mg total) by mouth 3 (three) times daily.   Lactulose 20 GM/30ML Soln Take 20 g by mouth daily as needed (constipation).   Magnesium 400 MG Tabs Take 200 mg by mouth daily.   meloxicam 7.5 MG tablet Commonly known as: MOBIC Take 7.5 mg by mouth daily.   metroNIDAZOLE 0.75 % cream Commonly known as: METROCREAM Apply 1 Application topically daily as needed (rosacea).   MILK OF MAGNESIA PO Take 30 mLs by mouth daily.   nystatin powder Generic drug: nystatin Apply 1 Application topically 2 (two) times daily. Discontinue once areas have healed   OLANZapine 10 MG tablet Commonly known as: ZYPREXA Take 1 tablet (10 mg total) by mouth at bedtime. What changed:  medication strength how much to take when to take this   Oxycodone HCl 10 MG Tabs Take 1 tablet (10 mg total) by mouth every 4 (four) hours as needed for up to 15 days (pain score 7-10). What changed: reasons to take this   oxymetazoline 0.05 % nasal spray Commonly known as: AFRIN Place 1 spray into both nostrils 2 (two) times daily.   pantoprazole 40 MG tablet Commonly known as: PROTONIX Take 1 tablet (40 mg total) by mouth 2 (two) times daily.   rivaroxaban 10 MG Tabs tablet Commonly known as: Xarelto Take 1 tablet (10 mg total) by mouth daily after  supper for 16 days.   tamsulosin 0.4 MG Caps capsule Commonly known as: FLOMAX TAKE 1 CAPSULE EVERY DAY AFTER SUPPER What changed: See the new instructions.   triamcinolone 0.025 % cream Commonly known as: KENALOG Apply 1 Application topically daily as needed (peeling skin).   tuberculin 5 UNIT/0.1ML injection Inject 5 Units into the skin once. One dose on 04/02/22. Next dose on 04/09/22 per MAR.               Discharge Care Instructions  (From admission, onward)           Start     Ordered   04/12/22 0000  Discharge wound care:       Comments: Knee wound per Orthopedic   04/12/22 1200             Disposition and follow-up:   [name] was discharged from [facility] in Stable condition.  At the hospital follow up visit please address:  1.    Acute encephalopathy 2/2 centrally acting medications His mental status is at baseline on current regimen -Continue same dose of Oxycodone and Xanax -Reduce Baclofen to 5 mg TID PRN -Reduce Zyprexa to 10 mg QHS  -Holding laxatives due to multiple BMs at discharge.  Resume if indicated.  Acute blood loss anemia  Suspect GI bleed. EGD only showed a nonbleeding duodenal ulcer.  His hemoglobin stable at 9s. -Continue PPI twice daily for 8 weeks -Follow-up CBC outpatient -Recommend checking iron study  Aspiration pneumonia Patient received 7 days of IV antibiotics (Cefepime/Flagyl and transitioned to Unasyn) -Assess respiratory status follow-up  Recent Right total knee replacement  -F/u with orthopedic as schedule  2.  Labs / imaging needed at time of follow-up: CBC, BMP, iron study, ferritin   3.  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  His hospital course was complicated by septic/hemorrhagic shock requiring ICU admission.   Acute Encephalopathy, likely toxic  Encephalopathy was likely secondary to his many centrally acting medications.  He has had negative head imaging and negative EEG while here. Has had several episodes of confusion/somnolence and possible facial droop without clear etiology determined. Neurology has signed off. Initially concern that encephalopathy was induced by multiple centrally  acting medications. These were initially held, but restarted at reduced dose per PCCM. His encephalopathy  improved, he is alert and oriented to person, place, time, and president on the day of discharge on a reduced dose of his centrally acting medications.    Septic/Hemorrhagic shock Possible GI bleed Aspiration pneumonia On the second day of admission, patient's blood pressure dropped to 55/28. Patient was minimally responsive.  Rapid response was called.  When evaluated at bedside, patient appeared diaphoretic and tachypneic.  He was awake and able to answer simple questions.  Chest CTA was done which was negative for PE.  There was minimal groundglass interstitial prominence, right greater than left on CTA.  CT abdomen/pelvic was negative for intraperitoneal bleeding or perforation.  Patient was admitted to the ICU needing pressor support.    CBC notable for hemoglobin of 6.8, dropped from 9.6 earlier that day.  He did have an episode of coffee ground emesis. Urgent  EGD was pretty unremarkable which only showed nonbleeding duodenal ulcer.  Received 2 unit of blood.  His hemoglobin has been stable and trending up to 9 on discharge.  No evidence of bleeding on discharge.  He required intubation 10/14 but was able to be extubated 10/15. Patient was treated for aspration PNA given finding on CTA. Was initiated on cefepime and flagyl, but transitioned to unasyn 10/17 to be completed on 10/20 (total 7 day course). MRSA PCR and RVP both negative.  His hypotension could be partially due to septic shock secondary to aspiration pneumonia.  Cultures are negative to date.  Patient's blood pressure maintained within normal limits after leaving the ICU.     Discharge Exam:   Subjective: Patient feels well.  Reports some diarrhea.  Wise, eager to return to rehabilitation for his TKA. BP 134/60 (BP Location: Right Arm)   Pulse 99   Temp 98.5 F (36.9 C) (Oral)   Resp 20   Ht '5\' 7"'$  (1.702 m) Comment: stated   Wt 109.9 kg   SpO2 91%   BMI 37.95 kg/m  General: pleasant elderly gentleman, NAD. CV: normal rate and regular rhythm, no m/r/g. Pulm: CTABL, no adventitious sounds noted. Saturating >95% on RA. Abdomen: soft, nondistended, nontender, normoactive bowel sounds. MSK: 1-2+ edema in bilateral UE, trace pitting edema in bilateral LE. Neuro: AAOx4, no focal deficits noted. Follows commands, moving all extremities. Psych: calm and cooperative.   Pertinent Labs, Studies, and Procedures:     Latest Ref Rng & Units 04/12/2022    2:48 AM 04/11/2022    2:49 AM 04/10/2022    4:49 PM  CBC  WBC 4.0 - 10.5 K/uL 15.6  17.5    Hemoglobin 13.0 - 17.0 g/dL 9.3  9.5  9.2   Hematocrit 39.0 - 52.0 % 27.9  28.9  27.6   Platelets 150 - 400 K/uL 400  347         Latest Ref Rng & Units 04/12/2022    2:48 AM 04/11/2022    2:49 AM 04/10/2022    6:11 AM  CMP  Glucose 70 - 99 mg/dL 110  115  95   BUN 8 - 23 mg/dL '7  8  8   '$ Creatinine 0.61 - 1.24 mg/dL 0.76  0.83  0.71   Sodium 135 - 145 mmol/L 135  138  140   Potassium 3.5 - 5.1 mmol/L 4.4  4.1  4.1   Chloride 98 - 111 mmol/L 98  101  108   CO2 22 - 32 mmol/L '29  29  25   '$ Calcium 8.9 - 10.3 mg/dL 8.4  8.5  8.4     DG Abd Portable 1V  Result Date: 04/06/2022 CLINICAL DATA:  OG tube placement. EXAM: PORTABLE ABDOMEN - 1 VIEW COMPARISON:  07/28/2020, 04/06/2022. FINDINGS: The bowel gas pattern is normal. An enteric tube terminates in the stomach. No radio-opaque calculi. Spinal fusion hardware in the thoracolumbar spine. IMPRESSION: 1. Nonobstructive bowel-gas pattern. 2. Enteric tube terminates in the stomach. Electronically Signed   By: Brett Fairy M.D.   On: 04/06/2022 21:28   DG CHEST PORT 1 VIEW  Result Date: 04/06/2022 CLINICAL DATA:  Central line and endotracheal tube EXAM: PORTABLE CHEST 1 VIEW COMPARISON:  Chest x-ray 04/05/2022. FINDINGS: There is a new endotracheal tube with distal tip 4 cm above the carina. New nasogastric tube tip is in  the body of the stomach. New right-sided central venous catheter tip projects over the distal SVC. The heart  is enlarged, unchanged. There are patchy opacities in the left lung base with small left pleural effusion, mildly increased. No evidence for pneumothorax or acute fracture. Thoracolumbar fusion hardware is again seen. IMPRESSION: 1. Lines and tubes as described above. 2. Mild increase in left basilar opacities and small left pleural effusion. 3. Stable cardiomegaly. Electronically Signed   By: Ronney Asters M.D.   On: 04/06/2022 18:29   ECHOCARDIOGRAM COMPLETE  Result Date: 04/06/2022    ECHOCARDIOGRAM REPORT   Patient Name:   Arleigh Odowd Date of Exam: 04/06/2022 Medical Rec #:  [id number]    Height:       68.0 in Accession #:    [id number]   Weight:       225.0 lb Date of Birth:  May 17, 1956   BSA:          2.149 m Patient Age:    25 years     BP:           140/93 mmHg Patient Gender: M            HR:           122 bpm. Exam Location:  Inpatient Procedure: 2D Echo, Cardiac Doppler, Color Doppler and Intracardiac            Opacification Agent                           STAT ECHO Reported to: Dr Renee Harder on 04/06/2022 5:01:00 PM. Indications:    Cardiomegaly  History:        Patient has prior history of Echocardiogram examinations. Risk                 Factors:Hypertension and Dyslipidemia. Stroke. On BiPAP.  Sonographer:    Clayton Lefort RDCS (AE) Referring Phys: Candee Furbish  Sonographer Comments: Technically challenging study due to limited acoustic windows, Technically difficult study due to poor echo windows and patient is obese. Image acquisition challenging due to patient body habitus. IMPRESSIONS  1. Left ventricular ejection fraction, by estimation, is 70 to 75%. The left ventricle has hyperdynamic function. The left ventricle has no regional wall motion abnormalities. There is severe concentric left ventricular hypertrophy. Left ventricular diastolic parameters are indeterminate.  2.  Right ventricular systolic function is hyperdynamic. The right ventricular size is normal.  3. The mitral valve was not well visualized. No evidence of mitral valve regurgitation.  4. The aortic valve was not well visualized. Aortic valve regurgitation is not visualized.  5. The inferior vena cava is normal in size with greater than 50% respiratory variability, suggesting right atrial pressure of 3 mmHg.  6. Limited study. Study is technically difficult. Comparison(s): No prior study in EMR. FINDINGS  Left Ventricle: Left ventricular ejection fraction, by estimation, is 70 to 75%. The left ventricle has hyperdynamic function. The left ventricle has no regional wall motion abnormalities. Definity contrast agent was given IV to delineate the left ventricular endocardial borders. The left ventricular internal cavity size was normal in size. There is severe concentric left ventricular hypertrophy. Left ventricular diastolic parameters are indeterminate. Right Ventricle: The right ventricular size is normal. No increase in right ventricular wall thickness. Right ventricular systolic function is hyperdynamic. Left Atrium: Left atrial size was not well visualized. Right Atrium: Right atrial size was not well visualized. Pericardium: Trivial pericardial effusion is present. Presence of epicardial fat layer. Mitral Valve: The mitral valve was not well visualized. No evidence of  mitral valve regurgitation. Tricuspid Valve: The tricuspid valve is not well visualized. Tricuspid valve regurgitation is not demonstrated. Aortic Valve: The aortic valve was not well visualized. Aortic valve regurgitation is not visualized. Aortic valve mean gradient measures 4.0 mmHg. Aortic valve peak gradient measures 8.0 mmHg. Aortic valve area, by VTI measures 2.64 cm. Pulmonic Valve: The pulmonic valve was not well visualized. Pulmonic valve regurgitation is not visualized. Aorta: The aortic root and ascending aorta are structurally normal,  with no evidence of dilitation. Venous: The inferior vena cava is normal in size with greater than 50% respiratory variability, suggesting right atrial pressure of 3 mmHg. IAS/Shunts: No atrial level shunt detected by color flow Doppler.  LEFT VENTRICLE PLAX 2D LVIDd:         5.00 cm LVIDs:         3.20 cm LV PW:         1.40 cm LV IVS:        1.50 cm LVOT diam:     2.30 cm LV SV:         47 LV SV Index:   22 LVOT Area:     4.15 cm  RIGHT VENTRICLE             IVC RV Basal diam:  2.70 cm     IVC diam: 1.50 cm RV S prime:     16.30 cm/s TAPSE (M-mode): 1.9 cm LEFT ATRIUM           Index        RIGHT ATRIUM           Index LA diam:      4.10 cm 1.91 cm/m   RA Area:     11.30 cm LA Vol (A4C): 45.7 ml 21.27 ml/m  RA Volume:   24.90 ml  11.59 ml/m  AORTIC VALVE AV Area (Vmax):    2.60 cm AV Area (Vmean):   2.52 cm AV Area (VTI):     2.64 cm AV Vmax:           141.00 cm/s AV Vmean:          92.400 cm/s AV VTI:            0.178 m AV Peak Grad:      8.0 mmHg AV Mean Grad:      4.0 mmHg LVOT Vmax:         88.10 cm/s LVOT Vmean:        56.000 cm/s LVOT VTI:          0.113 m LVOT/AV VTI ratio: 0.63  AORTA Ao Root diam: 3.90 cm Ao Asc diam:  3.50 cm  SHUNTS Systemic VTI:  0.11 m Systemic Diam: 2.30 cm Rudean Haskell MD Electronically signed by Rudean Haskell MD Signature Date/Time: 04/06/2022/5:15:33 PM    Final    CT ABDOMEN PELVIS W CONTRAST  Result Date: 04/06/2022 CLINICAL DATA:  Shortness of breath. Status post right knee replacement on 03/28/2022. Episode today of marked hypotension with diaphoresis and tachypnea. Clinical concern for peritonitis or bowel perforation as the cause. EXAM: CT ANGIOGRAPHY CHEST CT ABDOMEN AND PELVIS WITH CONTRAST TECHNIQUE: Multidetector CT imaging of the chest was performed using the standard protocol during bolus administration of intravenous contrast. Multiplanar CT image reconstructions and MIPs were obtained to evaluate the vascular anatomy. Multidetector CT  imaging of the abdomen and pelvis was performed using the standard protocol during bolus administration of intravenous contrast. RADIATION DOSE REDUCTION: This exam was performed according  to the departmental dose-optimization program which includes automated exposure control, adjustment of the mA and/or kV according to patient size and/or use of iterative reconstruction technique. CONTRAST:  43m OMNIPAQUE IOHEXOL 350 MG/ML SOLN COMPARISON:  Chest CT dated 02/09/2020 FINDINGS: CTA CHEST FINDINGS Cardiovascular: Mildly enlarged heart. Atheromatous calcifications, including the coronary arteries and aorta. Aberrant retroesophageal right subclavian artery. Normally opacified pulmonary arteries with no pulmonary arterial filling defects seen. Mediastinum/Nodes: No enlarged mediastinal, hilar, or axillary lymph nodes. Thyroid gland, trachea, and esophagus demonstrate no significant findings. Lungs/Pleura: Mild bilateral dependent atelectasis. No pleural fluid. Minimal ground-glass interstitial prominence in both lungs, greater on the right, not previously present. A previously demonstrated 5 mm left lower lobe pulmonary nodule is unchanged on image number 89/8 with a tiny central calcification. A previously demonstrated 2nd 5 mm left lower lobe nodule is no longer visualized. A previously demonstrated 3 mm right lower lobe nodule is unchanged on image number 58/8, with a tiny central calcification. A 7 x 3 mm nodule with a mean diameter of 5 mm adjacent to the right hemidiaphragm on image number 75/8 is unchanged. No new nodules seen. Musculoskeletal: Multiple old, healed left rib fractures. Thoracolumbar spine laminectomy defects and fixation hardware. Thoracic and lower cervical spine degenerative changes. Large right diaphragmatic eventration without significant change. Review of the MIP images confirms the above findings. CT ABDOMEN and PELVIS FINDINGS Hepatobiliary: Stable small calcified granuloma in the lateral  aspect of the right lobe of the liver. Cholecystectomy clips. Pancreas: Unremarkable. No pancreatic ductal dilatation or surrounding inflammatory changes. Spleen: Normal in size without focal abnormality. Adrenals/Urinary Tract: Foley catheter in the urinary bladder with associated small amount of air in the bladder. Mild-to-moderate diffuse bladder wall thickening. Unremarkable adrenal glands, kidneys and ureters. Stomach/Bowel: Stomach distended by air and fluid. Nonvisualized appendix. Mildly prominent stool, especially in the rectum. Unremarkable small bowel. Vascular/Lymphatic: No significant vascular findings are present. No enlarged abdominal or pelvic lymph nodes. Reproductive: Prostate is unremarkable. Other: No abdominal wall hernia or abnormality. No abdominopelvic ascites. Musculoskeletal: Extensive fusion hardware extending from the lower thoracic spine into the sacrum and both iliac bones. Lumbar spine degenerative changes. Review of the MIP images confirms the above findings. IMPRESSION: 1. No pulmonary emboli. 2. Minimal ground-glass interstitial prominence in both lungs, greater on the right, not previously present. This could represent mild interstitial pulmonary edema or mild interstitial pneumonitis. 3. Mild bilateral dependent atelectasis. 4. Mild-to-moderate diffuse bladder wall thickening, possibly due to cystitis. 5. Aberrant right subclavian artery. Aortic Atherosclerosis (ICD10-I70.0). Electronically Signed   By: SClaudie ReveringM.D.   On: 04/06/2022 16:39   CT Angio Chest Pulmonary Embolism (PE) W or WO Contrast  Result Date: 04/06/2022 CLINICAL DATA:  Shortness of breath. Status post right knee replacement on 03/28/2022. Episode today of marked hypotension with diaphoresis and tachypnea. Clinical concern for peritonitis or bowel perforation as the cause. EXAM: CT ANGIOGRAPHY CHEST CT ABDOMEN AND PELVIS WITH CONTRAST TECHNIQUE: Multidetector CT imaging of the chest was performed using  the standard protocol during bolus administration of intravenous contrast. Multiplanar CT image reconstructions and MIPs were obtained to evaluate the vascular anatomy. Multidetector CT imaging of the abdomen and pelvis was performed using the standard protocol during bolus administration of intravenous contrast. RADIATION DOSE REDUCTION: This exam was performed according to the departmental dose-optimization program which includes automated exposure control, adjustment of the mA and/or kV according to patient size and/or use of iterative reconstruction technique. CONTRAST:  829mOMNIPAQUE IOHEXOL 350 MG/ML SOLN COMPARISON:  Chest CT dated 02/09/2020 FINDINGS: CTA CHEST FINDINGS Cardiovascular: Mildly enlarged heart. Atheromatous calcifications, including the coronary arteries and aorta. Aberrant retroesophageal right subclavian artery. Normally opacified pulmonary arteries with no pulmonary arterial filling defects seen. Mediastinum/Nodes: No enlarged mediastinal, hilar, or axillary lymph nodes. Thyroid gland, trachea, and esophagus demonstrate no significant findings. Lungs/Pleura: Mild bilateral dependent atelectasis. No pleural fluid. Minimal ground-glass interstitial prominence in both lungs, greater on the right, not previously present. A previously demonstrated 5 mm left lower lobe pulmonary nodule is unchanged on image number 89/8 with a tiny central calcification. A previously demonstrated 2nd 5 mm left lower lobe nodule is no longer visualized. A previously demonstrated 3 mm right lower lobe nodule is unchanged on image number 58/8, with a tiny central calcification. A 7 x 3 mm nodule with a mean diameter of 5 mm adjacent to the right hemidiaphragm on image number 75/8 is unchanged. No new nodules seen. Musculoskeletal: Multiple old, healed left rib fractures. Thoracolumbar spine laminectomy defects and fixation hardware. Thoracic and lower cervical spine degenerative changes. Large right diaphragmatic  eventration without significant change. Review of the MIP images confirms the above findings. CT ABDOMEN and PELVIS FINDINGS Hepatobiliary: Stable small calcified granuloma in the lateral aspect of the right lobe of the liver. Cholecystectomy clips. Pancreas: Unremarkable. No pancreatic ductal dilatation or surrounding inflammatory changes. Spleen: Normal in size without focal abnormality. Adrenals/Urinary Tract: Foley catheter in the urinary bladder with associated small amount of air in the bladder. Mild-to-moderate diffuse bladder wall thickening. Unremarkable adrenal glands, kidneys and ureters. Stomach/Bowel: Stomach distended by air and fluid. Nonvisualized appendix. Mildly prominent stool, especially in the rectum. Unremarkable small bowel. Vascular/Lymphatic: No significant vascular findings are present. No enlarged abdominal or pelvic lymph nodes. Reproductive: Prostate is unremarkable. Other: No abdominal wall hernia or abnormality. No abdominopelvic ascites. Musculoskeletal: Extensive fusion hardware extending from the lower thoracic spine into the sacrum and both iliac bones. Lumbar spine degenerative changes. Review of the MIP images confirms the above findings. IMPRESSION: 1. No pulmonary emboli. 2. Minimal ground-glass interstitial prominence in both lungs, greater on the right, not previously present. This could represent mild interstitial pulmonary edema or mild interstitial pneumonitis. 3. Mild bilateral dependent atelectasis. 4. Mild-to-moderate diffuse bladder wall thickening, possibly due to cystitis. 5. Aberrant right subclavian artery. Aortic Atherosclerosis (ICD10-I70.0). Electronically Signed   By: Claudie Revering M.D.   On: 04/06/2022 16:39   CT EXTREMITY LOWER RIGHT WO CONTRAST  Result Date: 04/06/2022 CLINICAL DATA:  Chronic knee pain, negative radiographs. EXAM: CT OF THE LOWER RIGHT EXTREMITY WITHOUT CONTRAST TECHNIQUE: Multidetector CT imaging of the right lower extremity was  performed according to the standard protocol. RADIATION DOSE REDUCTION: This exam was performed according to the departmental dose-optimization program which includes automated exposure control, adjustment of the mA and/or kV according to patient size and/or use of iterative reconstruction technique. COMPARISON:  None Available. FINDINGS: Bones/Joint/Cartilage Status post right knee arthroplasty with intact hardware. No perihardware loosening. Moderate size suprapatellar knee joint effusion. Ligaments Suboptimally assessed by CT. Muscles and Tendons Generalized fatty infiltration of the flexor and extensor muscles about the knee joint. No intramuscular fluid collection or hematoma. Soft tissues Mild subcutaneous soft tissue edema. No fluid collection or hematoma. IMPRESSION: 1. Status post right knee arthroplasty with intact hardware. No perihardware loosening. 2. Moderate size suprapatellar knee joint effusion. 3. Mild subcutaneous soft tissue edema. No fluid collection or hematoma. 4. Generalized fatty infiltration of the flexor and extensor muscles about the knee joint. Electronically Signed  By: Keane Police D.O.   On: 04/06/2022 16:30   DG Knee Complete 4 Views Right  Result Date: 04/06/2022 CLINICAL DATA:  Right knee surgery on Tuesday with right knee pain today. EXAM: RIGHT KNEE - COMPLETE 4+ VIEW COMPARISON:  March 11, 2022 FINDINGS: No evidence of fracture, dislocation, or joint effusion. Right knee replacement is identified without malalignment. IMPRESSION: Right knee replacement without malalignment.  No acute abnormality. Electronically Signed   By: Abelardo Diesel M.D.   On: 04/06/2022 12:32   CT HEAD WO CONTRAST (5MM)  Result Date: 04/06/2022 CLINICAL DATA:  Neuro deficit.  Stroke suspected. EXAM: CT HEAD WITHOUT CONTRAST TECHNIQUE: Contiguous axial images were obtained from the base of the skull through the vertex without intravenous contrast. RADIATION DOSE REDUCTION: This exam was  performed according to the departmental dose-optimization program which includes automated exposure control, adjustment of the mA and/or kV according to patient size and/or use of iterative reconstruction technique. COMPARISON:  04/05/2022 FINDINGS: Brain: There is no evidence for acute hemorrhage, hydrocephalus, mass lesion, or abnormal extra-axial fluid collection. No definite CT evidence for acute infarction. Vascular: No hyperdense vessel or unexpected calcification. Skull: No evidence for fracture. No worrisome lytic or sclerotic lesion. Sinuses/Orbits: The visualized paranasal sinuses and mastoid air cells are clear. Radiopaque/metallic foreign body again noted right orbit. Other: None. IMPRESSION: 1. No acute intracranial abnormality. 2. Radiopaque/metallic foreign body again noted right orbit. Electronically Signed   By: Misty Stanley M.D.   On: 04/06/2022 10:46   US RENAL  Result Date: 04/05/2022 CLINICAL DATA:  AKI EXAM: RENAL / URINARY TRACT ULTRASOUND COMPLETE COMPARISON:  Lumbar spine CT 06/30/2019, right upper quadrant ultrasound 04/02/2009 FINDINGS: Right Kidney: Renal measurements: 12.0 cm x 5.0 cm x 7.0 cm = volume: 243 mL. Echogenicity within normal limits. No mass or hydronephrosis visualized. Left Kidney: Renal measurements: 12.6 cm x 7.1 cm x 6.9 cm = volume: 326 mL. Echogenicity within normal limits. No mass or hydronephrosis visualized. Bladder: Not identified. Other: None. IMPRESSION: Normal appearance of the kidneys. Electronically Signed   By: Valetta Mole M.D.   On: 04/05/2022 16:36   EEG adult  Result Date: 04/05/2022 Lora Havens, MD     04/05/2022 11:48 AM Patient Name: Tery Hoeger MRN: [id number] Epilepsy Attending: Lora Havens Referring Physician/Provider: Katy Apo, NP Date: 04/05/2022 Duration: 28.11 mins Patient history: 66yo M with ams. EEG to evaluate for seizure Level of alertness: Awake AEDs during EEG study: None Technical aspects: This EEG  study was done with scalp electrodes positioned according to the 10-20 International system of electrode placement. Electrical activity was reviewed with band pass filter of 1-'70Hz'$ , sensitivity of 7 uV/mm, display speed of 19m/sec with a '60Hz'$  notched filter applied as appropriate. EEG data were recorded continuously and digitally stored.  Video monitoring was available and reviewed as appropriate. Description: The posterior dominant rhythm consists of 8 Hz activity of moderate voltage (25-35 uV) seen predominantly in posterior head regions, symmetric and reactive to eye opening and eye closing. EEG showed continuous generalized 3-6 Hz theta-delta slowing.  Sharp transients were noted in right occipital region. Patient was noted to have an episode of right upper extremity twitching at 0944.  Concomitant EEG before, during and after the event did not show any EEG changes suggest seizure. Hyperventilation and photic stimulation were not performed.   ABNORMALITY - Continuous slow, generalized IMPRESSION: This study is of a mild to moderate diffuse encephalopathy, nonspecific etiology.  No seizures or epileptiform  discharges were seen throughout the recording. Patient was noted to have an episode of right upper extremity twitching at 0944 without concomitant EEG change.  This was not an epileptic event. Priyanka Barbra Sarks   DG CHEST PORT 1 VIEW  Result Date: 04/05/2022 CLINICAL DATA:  Came in for slurred speech and bilateral weakness. EXAM: PORTABLE CHEST 1 VIEW COMPARISON:  Chest radiograph July 31, 2020 FINDINGS: Low lung volumes. No pleural effusion. No focal airspace opacity. No pneumothorax. Unchanged asymmetric elevation of the right hemidiaphragm and flattening of the left hemidiaphragm. There is slightly decreased conspicuity of the left hemidiaphragm, likely secondary to a superimposed airspace opacity. Redemonstrated multiple contiguous left-sided lateral rib fractures. No definite acute displaced rib  fracture is visualized. Degenerative changes of the bilateral glenohumeral and AC joints. The visualized upper abdomen is unremarkable. Thoracic spinal fusion hardware appears intact on this single view radiograph. Cholecystectomy clips in the right upper quadrant. IMPRESSION: Left basilar opacity, nonspecific, but could represent atelectasis. Electronically Signed   By: Marin Roberts M.D.   On: 04/05/2022 09:53   CT HEAD CODE STROKE WO CONTRAST  Result Date: 04/05/2022 CLINICAL DATA:  Code stroke. EXAM: CT HEAD WITHOUT CONTRAST TECHNIQUE: Contiguous axial images were obtained from the base of the skull through the vertex without intravenous contrast. RADIATION DOSE REDUCTION: This exam was performed according to the departmental dose-optimization program which includes automated exposure control, adjustment of the mA and/or kV according to patient size and/or use of iterative reconstruction technique. COMPARISON:  CT Head 09/06/19 FINDINGS: Brain: No evidence of acute infarction, hemorrhage, hydrocephalus, extra-axial collection or mass lesion/mass effect. Vascular: No hyperdense vessel or unexpected calcification. Skull: Normal. Negative for fracture or focal lesion. Sinuses/Orbits: No acute finding. Nonspecific metallic foreign body in the posterior orbit on the right. Other: None. ASPECTS ([place] Stroke Program Early CT Score): 10 IMPRESSION: 1. No hemorrhage or CT evidence of an acute infarct. Aspects is 10. 2. Nonspecific metallic foreign body in the posterior orbit on the right. Findings were communicated to Dr. Leonie Man on 04/05/22 at 9:12 AM via [facility] paging system. Electronically Signed   By: Marin Roberts M.D.   On: 04/05/2022 09:15     Discharge Instructions: Discharge Instructions     Call MD for:  difficulty breathing, headache or visual disturbances   Complete by: As directed    Call MD for:  persistant nausea and vomiting   Complete by: As directed    Call MD for:  redness, tenderness, or  signs of infection (pain, swelling, redness, odor or green/yellow discharge around incision site)   Complete by: As directed    Call MD for:  severe uncontrolled pain   Complete by: As directed    Call MD for:  temperature >100.4   Complete by: As directed    Diet - low sodium heart healthy   Complete by: As directed    Discharge instructions   Complete by: As directed    Mr. Stacey,  It was a pleasure taking care of you during this admission.  You were hospitalized for altered mental status from high dose of pain and sedating medications. We have made changes to your regimen so that you are not too sedated but also have good pain control.   Here is your regimen: -Continue Meloxicam -Continue Oxycodone 10 mg every 4 hour as needed -Continue Gabapentin 300 mg every 8 hour as needed -Continue Xanax 0.5 mg every 8 hour as needed  -Reduce Baclofen to 5 mg every 8 hour  as needed -Reduce Zyprexa to 10 mg at bed time  We started you on Xarelto 10 mg daily after supper to prevent blood clots after knee surgery.  Please continue this medication for the next two weeks.  We believe you had an episode of show intestinal bleeding which has stopped spontaneously.  Your hemoglobin has been stable.  Please continue Protonix twice daily for the next 8 weeks.  You also finished a course of IV antibiotic for pneumonia.  Blood pressure has returned to normal.  I am holding your lisinopril at this time to avoid low blood pressure.  We held some of your laxatives given you have many bowel movements here.  The doctor at [facility] can resume them if you become constipated.  Take care,  Dr. Alfonse Spruce   Discharge wound care:   Complete by: As directed    Knee wound per Orthopedic   Increase activity slowly   Complete by: As directed        Signed: Gaylan Gerold, DO 04/12/2022, 1:21 PM   Pager: [phone]

## 2022-04-12 NOTE — Plan of Care (Signed)
  Problem: Education: Goal: Knowledge of the prescribed therapeutic regimen will improve Outcome: Progressing   Problem: Activity: Goal: Ability to avoid complications of mobility impairment will improve Outcome: Progressing Goal: Range of joint motion will improve Outcome: Completed/Met   Problem: Clinical Measurements: Goal: Postoperative complications will be avoided or minimized Outcome: Completed/Met   Problem: Pain Management: Goal: Pain level will decrease with appropriate interventions Outcome: Completed/Met   Problem: Skin Integrity: Goal: Will show signs of wound healing Outcome: Progressing

## 2022-04-12 NOTE — Discharge Instructions (Addendum)

## 2022-04-13 NOTE — Telephone Encounter (Signed)
I was planning on seeing him with the rounding this weekend but it looks like he just got discharged so can we see if we can get him appointment with Dr. Marlou Sa or myself next week?  He is supposed to come in for first postop visit with me yesterday but looks like he is not able to because of his hospitalization

## 2022-04-15 NOTE — Telephone Encounter (Signed)
Talked with daughter. Scheduled for this Friday afternoon with [facility].

## 2022-04-19 ENCOUNTER — Ambulatory Visit (INDEPENDENT_AMBULATORY_CARE_PROVIDER_SITE_OTHER): Payer: MEDICARE | Admitting: Surgical

## 2022-04-19 ENCOUNTER — Ambulatory Visit (INDEPENDENT_AMBULATORY_CARE_PROVIDER_SITE_OTHER): Payer: MEDICARE

## 2022-04-19 DIAGNOSIS — Z96651 Presence of right artificial knee joint: Secondary | ICD-10-CM

## 2022-04-21 ENCOUNTER — Encounter: Payer: Self-pay | Admitting: Surgical

## 2022-04-21 NOTE — Progress Notes (Signed)
Post-Op Visit Note   Patient: Randall Mccormick           Date of Birth: 03-24-1956           MRN: [id number] Visit Date: 04/19/2022 PCP: [name], MD   Assessment & Plan:  Chief Complaint:  Chief Complaint  Patient presents with   Post-op Follow-up   Visit Diagnoses:  1. History of arthroplasty of right knee     Plan: Artis Beggs is a 66 y.o. male who presents s/p right total knee arthroplasty on 03/28/2022.  Doing well overall.  They deny any calf pain, shortness of breath, chest pain, abdominal pain.  Pain is overall controlled and taking oxycodone about twice per day at most for pain control.  Taking Xarelto for DVT prophylaxis.  Ambulating with walker.  He had complicated postsurgical hospitalization about a week after his surgery where he had altered mental status with acute encephalopathy and required intubation for period of time.  GI bleed was suspected and he had EGD that showed nonbleeding ulcer.  Tavius says that he was not really given any definitive explanation for why he was having his symptoms but he is feeling pretty much back to baseline at this point.  He was initially placed on aspirin twice daily for DVT prophylaxis but this was switched to Xarelto by the hospitalist team due to the lengthy period of time that he was immobilized.  He is now staying at nursing facility and progressing fairly well with physical therapy.  He walks about 86 steps with a walker..   On exam, patient has range of motion 0 degrees extension to 105 degrees knee flexion.  Incision is healing well without evidence of infection or dehiscence.  2+ DP pulse of the operative extremity.  No calf tenderness, negative Homans' sign.  Able to perform straight leg raise without extensor lag.  Intact ankle dorsiflexion.  Plan is continue with working with physical therapy with focus on gait training.  He is eager to go home and fortunately he has a 1 level home with no stairs to get into the house.   Suspect that after he is able to work with physical therapy and progress his distance and practice getting in and out of the car, he should be able to go sometime next week but we will leave this up to the physical therapist working with him.  He is taking Xarelto for DVT prophylaxis currently and there is no sign or symptom of DVT/PE.  Plan to check ultrasound to rule out any less symptomatic DVT next week and as long as that is negative, plan to transition to aspirin 81 mg daily.  Patient and wife agreed with this plan.  Right knee radiographs demonstrate total knee prosthesis in excellent position and alignment without any complicating features.  His range of motion is a lot better than would be expected given his increased immobilization compared with most patients after knee replacement but overall he is doing a great job with his therapy exercises.  Follow-up in 4 weeks for clinical recheck with Dr. Marlou Sa..    Follow-Up Instructions: No follow-ups on file.   Orders:  Orders Placed This Encounter  Procedures   XR Knee 1-2 Views Right   VAS [place] LOWER EXTREMITY VENOUS (DVT)   No orders of the defined types were placed in this encounter.   Imaging: No results found.  PMFS History: Patient Active Problem List   Diagnosis Date Noted   Aspiration pneumonia ([place]) 04/12/2022  Pressure injury of skin 04/09/2022   Uremia 04/07/2022   Acute on chronic anemia 04/07/2022   Acute respiratory failure with hypoxia ([place]) 04/07/2022   Septic shock (HCC)    AKI (acute kidney injury) ([place])    Hyperkalemia    Acute blood loss anemia    Hematemesis with nausea    Acute encephalopathy 04/05/2022   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) 03/29/2022   OA (osteoarthritis) of knee 03/28/2022   Arthritis of knee 03/28/2022   Abnormal gait due to muscle weakness 03/28/2021   Urinary retention 03/28/2021   Incomplete paraplegia ([place]) 09/08/2020   Spasticity 09/08/2020   Wheelchair dependence  09/08/2020   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/2020   Hypoalbuminemia due to protein-calorie malnutrition [facility])    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/2020   Constipation    Neurogenic bowel    Neurogenic bladder    Neuropathic pain    Chronic pain syndrome    Anxiety    Cobalamin deficiency    Degeneration of lumbar or lumbosacral intervertebral disc    Hemorrhoids    Hyperlipidemia    Hypertension    Depression    Gouty arthropathy, unspecified    Vitamin D deficiency    Multiple fractures of ribs of left side 04/20/2013   Past Medical History:  Diagnosis Date   Anxiety    Cobalamin deficiency    Degeneration of lumbar or lumbosacral intervertebral disc    Depression    Gouty arthropathy, unspecified    Hemorrhoids    Hyperlipidemia    Hypertension    Multiple fractures of ribs of left side 04/20/2013   FALL FROM A LADDER ON    Vitamin D deficiency     Family History  Problem Relation Age of Onset   Cancer Mother        BREAST   Heart disease Father    Alcoholism Father    Cancer Son        LEUKEMIA    Past Surgical History:  Procedure Laterality Date   APPENDECTOMY     BACK SURGERY     x 6   CHOLECYSTECTOMY  06/24/2008   [facility]   ESOPHAGOGASTRODUODENOSCOPY N/A 04/06/2022   Procedure: ESOPHAGOGASTRODUODENOSCOPY (EGD);  Surgeon: Doran Stabler, MD;  Location: [facility];  Service: Gastroenterology;  Laterality: N/A;   TONSILLECTOMY     TOTAL KNEE ARTHROPLASTY Right 03/28/2022   Procedure: RIGHT TOTAL KNEE ARTHROPLASTY;  Surgeon: Meredith Pel, MD;  Location: [facility];  Service: Orthopedics;  Laterality: Right;   Social History   Occupational History   Not on file  Tobacco Use   Smoking status: Never   Smokeless tobacco: Former    Types: Chew    Quit date: 2022  Vaping Use   Vaping Use: Never used  Substance and Sexual Activity   Alcohol use: Yes    Comment: 6-8 BEERS A NIGHT   Drug use: Not Currently   Sexual  activity: Not Currently

## 2022-04-22 ENCOUNTER — Telehealth: Payer: Self-pay

## 2022-04-22 NOTE — Telephone Encounter (Signed)
call came straight to my desk. [facility] called and pt is s/p a right total knee 03/28/2022 had U/S to r/o DVT and this was negative. Heather with SNF states pt is on a BASA and Xarelto 10 mg daily is there anything else that they need to do for this pt? Please call back [phone] ext [phone] this is heather's direct line.

## 2022-04-22 NOTE — Telephone Encounter (Signed)
I called and left a VM to discontinue Xarelto and just take ASA '81mg'$  qd

## 2022-05-03 ENCOUNTER — Ambulatory Visit: Payer: MEDICARE | Admitting: Physical Medicine and Rehabilitation

## 2022-05-08 ENCOUNTER — Encounter: Payer: MEDICARE | Admitting: Orthopedic Surgery

## 2022-05-27 ENCOUNTER — Ambulatory Visit: Payer: Self-pay

## 2022-05-27 ENCOUNTER — Ambulatory Visit (INDEPENDENT_AMBULATORY_CARE_PROVIDER_SITE_OTHER): Payer: MEDICARE

## 2022-05-27 ENCOUNTER — Ambulatory Visit (INDEPENDENT_AMBULATORY_CARE_PROVIDER_SITE_OTHER): Payer: MEDICARE | Admitting: Orthopedic Surgery

## 2022-05-27 DIAGNOSIS — R29898 Other symptoms and signs involving the musculoskeletal system: Secondary | ICD-10-CM

## 2022-05-27 DIAGNOSIS — M25561 Pain in right knee: Secondary | ICD-10-CM | POA: Diagnosis not present

## 2022-05-27 DIAGNOSIS — M25562 Pain in left knee: Secondary | ICD-10-CM | POA: Diagnosis not present

## 2022-05-27 DIAGNOSIS — Z96651 Presence of right artificial knee joint: Secondary | ICD-10-CM

## 2022-05-28 ENCOUNTER — Encounter: Payer: Self-pay | Admitting: Orthopedic Surgery

## 2022-05-28 NOTE — Progress Notes (Unsigned)
Office Visit Note   Patient: Randall Mccormick           Date of Birth: [birth year]           MRN: [id number] Visit Date: 05/27/2022 Requested by: [name], MD [address] [place],  [place] [address] PCP: [name], MD  Subjective: Chief Complaint  Patient presents with   Right Knee - Routine Post Op    HPI: Randall Mccormick is a 66 y.o. male who presents to the office reporting bilateral leg weakness.  He is having difficulty because it is hard for him to stand.  This started a week ago.  Taking oxycodone for pain.  Had right total knee replacement on 523 and was doing very well with that until he had difficulty standing.  Reports left-sided leg pain more than right sided pain.  Denies any bowel or bladder symptoms.  Does have a history of multiple back surgeries done elsewhere..                ROS: All systems reviewed are negative as they relate to the chief complaint within the history of present illness.  Patient denies fevers or chills.  Assessment & Plan: Visit Diagnoses:  1. History of arthroplasty of right knee   2. Left knee pain, unspecified chronicity   3. Bilateral leg weakness     Plan: Impression is bilateral leg weakness and inability to stand with some hip flexion weakness present.  Plan is MRI scan of the thoracic spine and lumbar spine.  With history of prior surgeries I think it is possible he may have a disc problem or stenosis problem getting him this weakness.  Follow-up after that study.  No fevers or chills.  Follow-Up Instructions: No follow-ups on file.   Orders:  Orders Placed This Encounter  Procedures   XR KNEE 3 VIEW RIGHT   XR KNEE 3 VIEW LEFT   MR Lumbar Spine w/o contrast   MR Thoracic Spine w/o contrast   No orders of the defined types were placed in this encounter.     Procedures: No procedures performed   Clinical Data: No additional findings.  Objective: Vital Signs: There were no vitals taken for  this visit.  Physical Exam:  Constitutional: Patient appears well-developed HEENT:  Head: Normocephalic Eyes:EOM are normal Neck: Normal range of motion Cardiovascular: Normal rate Pulmonary/chest: Effort normal Neurologic: Patient is alert Skin: Skin is warm Psychiatric: Patient has normal mood and affect  Ortho Exam: Ortho exam demonstrates full active and passive range of motion of the hips and ankles.  Right knee actually has good range of motion as well from 0 to past 90.  Collaterals are stable.  Has 5 out of 5 ankle dorsiflexion plantarflexion strength with 4 out of 5 quad strength bilaterally left slightly weaker than right as well as diminished hip flexion strength bilaterally as well.  No definite paresthesias although it is difficult to really assess that status post total knee replacement recently.  Specialty Comments:  No specialty comments available.  Imaging: XR KNEE 3 VIEW LEFT  Result Date: 05/28/2022 AP lateral merchant radiographs left knee reviewed.  Tricompartmental osteoarthritic changes are present.  Sclerosis and joint space narrowing noted.  No acute fracture.  XR KNEE 3 VIEW RIGHT  Result Date: 05/28/2022 AP lateral merchant radiographs right knee reviewed.  Total knee prosthesis in good position alignment with no complicating features.  Knee is slightly flexed which makes evaluation of coronal alignment  less reliable.    PMFS History: Patient Active Problem List   Diagnosis Date Noted   Aspiration pneumonia ([place]) 04/12/2022   Pressure injury of skin 04/09/2022   Uremia 04/07/2022   Acute on chronic anemia 04/07/2022   Acute respiratory failure with hypoxia ([place]) 04/07/2022   Septic shock (HCC)    AKI (acute kidney injury) ([place])    Hyperkalemia    Acute blood loss anemia    Hematemesis with nausea    Acute encephalopathy 04/05/2022   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) 03/29/2022   OA (osteoarthritis) of knee 03/28/2022    Arthritis of knee 03/28/2022   Abnormal gait due to muscle weakness 03/28/2021   Urinary retention 03/28/2021   Incomplete paraplegia ([place]) 09/08/2020   Spasticity 09/08/2020   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/08/2020   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/2020   Hypoalbuminemia due to protein-calorie malnutrition ([place])    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/2020   Constipation    Neurogenic bowel    Neurogenic bladder    Neuropathic pain    Chronic pain syndrome    Anxiety    Cobalamin deficiency    Degeneration of lumbar or lumbosacral intervertebral disc    Hemorrhoids    Hyperlipidemia    Hypertension    Depression    Gouty arthropathy, unspecified    Vitamin D deficiency    Multiple fractures of ribs of left side 04/20/2013   Past Medical History:  Diagnosis Date   Anxiety    Cobalamin deficiency    Degeneration of lumbar or lumbosacral intervertebral disc    Depression    Gouty arthropathy, unspecified    Hemorrhoids    Hyperlipidemia    Hypertension    Multiple fractures of ribs of left side 04/20/2013   FALL FROM A LADDER ON    Vitamin D deficiency     Family History  Problem Relation Age of Onset   Cancer Mother        BREAST   Heart disease Father    Alcoholism Father    Cancer Son        LEUKEMIA    Past Surgical History:  Procedure Laterality Date   APPENDECTOMY     BACK SURGERY     x 6   CHOLECYSTECTOMY  06/24/2008   [facility]   ESOPHAGOGASTRODUODENOSCOPY N/A 04/06/2022   Procedure: ESOPHAGOGASTRODUODENOSCOPY (EGD);  Surgeon: Doran Stabler, MD;  Location: [facility];  Service: Gastroenterology;  Laterality: N/A;   TONSILLECTOMY     TOTAL KNEE ARTHROPLASTY Right 03/28/2022   Procedure: RIGHT TOTAL KNEE ARTHROPLASTY;  Surgeon: Meredith Pel, MD;  Location: [facility];  Service: Orthopedics;  Laterality: Right;   Social History   Occupational History   Not on file  Tobacco Use   Smoking status: Never   Smokeless tobacco:  Former    Types: Chew    Quit date: 2022  Vaping Use   Vaping Use: Never used  Substance and Sexual Activity   Alcohol use: Yes    Comment: 6-8 BEERS A NIGHT   Drug use: Not Currently   Sexual activity: Not Currently

## 2022-06-07 ENCOUNTER — Ambulatory Visit: Payer: MEDICARE | Admitting: Physical Medicine and Rehabilitation

## 2022-06-25 ENCOUNTER — Ambulatory Visit
Admission: RE | Admit: 2022-06-25 | Discharge: 2022-06-25 | Disposition: A | Discharge status: Home / Self Care | Payer: MEDICARE | Source: Ambulatory Visit | Attending: Orthopedic Surgery | Admitting: Orthopedic Surgery

## 2022-06-25 DIAGNOSIS — R29898 Other symptoms and signs involving the musculoskeletal system: Secondary | ICD-10-CM

## 2022-06-26 ENCOUNTER — Ambulatory Visit (INDEPENDENT_AMBULATORY_CARE_PROVIDER_SITE_OTHER): Payer: MEDICARE | Admitting: Orthopedic Surgery

## 2022-06-26 DIAGNOSIS — M1712 Unilateral primary osteoarthritis, left knee: Secondary | ICD-10-CM

## 2022-06-26 NOTE — Progress Notes (Signed)
Office Visit Note   Patient: Randall Mccormick           Date of Birth: 26-Sep-1955           MRN: [id number] Visit Date: 06/26/2022 Requested by: [name], MD [address] [place],  [place] [address] PCP: [name], MD  Subjective: Chief Complaint  Patient presents with   Right Knee - Routine Post Op   Left Knee - Pain    HPI: Randall Mccormick is a 67 y.o. male who presents to the office reporting left knee pain.  He underwent right total knee replacement 03/28/2022 and he is doing very well with that.  Left knee pain he states is not easing.  He states that 1 week ago the left knee "quit on me".  Would like to try an injection today.  He has not been walking much due to his left knee.  Since he was last seen he has also had a T-spine and L-spine MRI.  The scans are reviewed with the patient.  In general there is a lot of artifact but there is nothing overtly compressed or apparent with the construct that has been put in place.  On my review I thought there may have been some relatively close proximity of one of the pedicle screws on the left-hand side in the mid thoracic region of the construct..                ROS: All systems reviewed are negative as they relate to the chief complaint within the history of present illness.  Patient denies fevers or chills.  Assessment & Plan: Visit Diagnoses:  1. Arthritis of left knee     Plan: Impression is well-functioning right total knee replacement.  Left knee has arthritis and that is aspirated and injected today.  85-monthreturn for evaluation of the left knee.  He may consider getting the left knee replaced depending on how he does with this injection and depending on how his back feels.  He really would not be interested in any type of revision surgery on the back at this time so we will not pursue further imaging.  Follow-Up Instructions: No follow-ups on file.   Orders:  No orders of the defined types were  placed in this encounter.  No orders of the defined types were placed in this encounter.     Procedures: Large Joint Inj: L knee on 06/26/2022 6:31 PM Indications: diagnostic evaluation, joint swelling and pain Details: 18 G 1.5 in needle, superolateral approach  Arthrogram: No  Medications: 5 mL lidocaine 1 %; 40 mg methylPREDNISolone acetate 40 MG/ML; 4 mL bupivacaine 0.25 % Outcome: tolerated well, no immediate complications Procedure, treatment alternatives, risks and benefits explained, specific risks discussed. Consent was given by the patient. Immediately prior to procedure a time out was called to verify the correct patient, procedure, equipment, support staff and site/side marked as required. Patient was prepped and draped in the usual sterile fashion.       Clinical Data: No additional findings.  Objective: Vital Signs: There were no vitals taken for this visit.  Physical Exam:  Constitutional: Patient appears well-developed HEENT:  Head: Normocephalic Eyes:EOM are normal Neck: Normal range of motion Cardiovascular: Normal rate Pulmonary/chest: Effort normal Neurologic: Patient is alert Skin: Skin is warm Psychiatric: Patient has normal mood and affect  Ortho Exam: Ortho exam on the right demonstrates excellent strength and range of motion with that right total knee replacement.  Range of motion is about 0-1 15 with very good stability to varus valgus stress and intact extensor mechanism.  On the left-hand side he has a little bit more edema on that side as well as range of motion of about 5-1 10.  No groin pain with internal or external rotation of that left leg.  Has 5- out of 5 hip flexion strength on the left compared to 5+ out of 5 on the right.  Abduction adduction strength is fairly symmetric on both sides.  ecialty Comments:  No specialty comments available.  Imaging: No results found.   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) 03/29/2022   OA (osteoarthritis) of knee 03/28/2022   Arthritis of knee 03/28/2022   Abnormal gait due to muscle weakness 03/28/2021   Urinary retention 03/28/2021   Incomplete paraplegia ([place]) 09/08/2020   Spasticity 09/08/2020   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/08/2020   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/2020   Hypoalbuminemia due to protein-calorie malnutrition ([place])    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/2020   Constipation    Neurogenic bowel    Neurogenic bladder    Neuropathic pain    Chronic pain syndrome    Anxiety    Cobalamin deficiency    Degeneration of lumbar or lumbosacral intervertebral disc    Hemorrhoids    Hyperlipidemia    Hypertension    Depression    Gouty arthropathy, unspecified    Vitamin D deficiency    Multiple fractures of ribs of left side 04/20/2013   Past Medical History:  Diagnosis Date   Anxiety    Cobalamin deficiency    Degeneration of lumbar or lumbosacral intervertebral disc    Depression    Gouty arthropathy, unspecified    Hemorrhoids    Hyperlipidemia    Hypertension    Multiple fractures of ribs of left side 04/20/2013   FALL FROM A LADDER ON    Vitamin D deficiency     Family History  Problem Relation Age of Onset   Cancer Mother        BREAST   Heart disease Father    Alcoholism Father    Cancer Son        LEUKEMIA    Past Surgical History:  Procedure Laterality Date   APPENDECTOMY     BACK SURGERY     x 6   CHOLECYSTECTOMY  06/24/2008   [facility]   ESOPHAGOGASTRODUODENOSCOPY N/A 04/06/2022    Procedure: ESOPHAGOGASTRODUODENOSCOPY (EGD);  Surgeon: Doran Stabler, MD;  Location: [facility];  Service: Gastroenterology;  Laterality: N/A;   TONSILLECTOMY     TOTAL KNEE ARTHROPLASTY Right 03/28/2022   Procedure: RIGHT TOTAL KNEE ARTHROPLASTY;  Surgeon: Meredith Pel, MD;  Location: [facility];  Service: Orthopedics;  Laterality: Right;   Social History   Occupational History   Not on file  Tobacco Use   Smoking status: Never   Smokeless tobacco: Former    Types: Chew    Quit date: 2022  Vaping Use  Vaping Use: Never used  Substance and Sexual Activity   Alcohol use: Yes    Comment: 6-8 BEERS A NIGHT   Drug use: Not Currently   Sexual activity: Not Currently

## 2022-06-29 MED ORDER — BUPIVACAINE HCL 0.25 % IJ SOLN
4.0000 mL | INTRAMUSCULAR | Status: AC | PRN
  Administered 2022-06-26: 4 mL via INTRA_ARTICULAR

## 2022-06-29 MED ORDER — LIDOCAINE HCL 1 % IJ SOLN
5.0000 mL | INTRAMUSCULAR | Status: AC | PRN
  Administered 2022-06-26: 5 mL

## 2022-06-29 MED ORDER — METHYLPREDNISOLONE ACETATE 40 MG/ML IJ SUSP
40.0000 mg | INTRAMUSCULAR | Status: AC | PRN
  Administered 2022-06-26: 40 mg via INTRA_ARTICULAR

## 2022-08-09 ENCOUNTER — Encounter: Payer: MEDICARE | Admitting: Physical Medicine and Rehabilitation

## 2022-08-30 ENCOUNTER — Other Ambulatory Visit: Payer: Self-pay | Admitting: *Deleted

## 2022-08-30 NOTE — Patient Outreach (Signed)
Mr. Escribano resides in [facility] skilled nursing facility. Screening for potential [place] care coordination needs as benefit of health plan and Primary Care Provider.   Collaboration with Janett Billow, Conservation officer, nature. Mr. Placek transition plan is to return home with spouse.   Will continue to follow and plan outreach, as appropriate, to discuss potential [place] care coordination services.   Marthenia Rolling, MSN, RN,BSN [place] Acute Care Coordinator [phone] (Direct dial)

## 2022-09-05 ENCOUNTER — Other Ambulatory Visit: Payer: Self-pay | Admitting: *Deleted

## 2022-09-05 DIAGNOSIS — I10 Essential (primary) hypertension: Secondary | ICD-10-CM

## 2022-09-05 NOTE — Patient Outreach (Signed)
[place] Coordinator follow up. Verified with Hessie Diener [facility] social worker, Mr. Harlin transitioned to home today with spouse. He will have [facility] services.   Will refer to [facility] care coordination team.   Marthenia Rolling, MSN, RN,BSN [place] Acute Care Coordinator [phone] (Direct dial)

## 2022-09-06 ENCOUNTER — Telehealth: Payer: Self-pay | Admitting: *Deleted

## 2022-09-06 NOTE — Progress Notes (Signed)
  Care Coordination   Note   09/06/2022 Name: Randall Mccormick MRN: [id number] DOB: 01/02/1956  [name] is a 67 y.o. year old male who sees [name], MD for primary care. I reached out to [name] by phone today to offer care coordination services.  [name] was given information about Care Coordination services today including:   The Care Coordination services include support from the care team which includes your Nurse Coordinator, Clinical Social Worker, or Pharmacist.  The Care Coordination team is here to help remove barriers to the health concerns and goals most important to you. Care Coordination services are voluntary, and the patient may decline or stop services at any time by request to their care team member.   Care Coordination Consent Status: Patient agreed to services and verbal consent obtained.   Follow up plan:  Telephone appointment with care coordination team member scheduled for:  09/12/2022  Encounter Outcome:  Pt. Scheduled from referral   Julian Hy, [place] Direct Dial: [phone]

## 2022-09-12 ENCOUNTER — Ambulatory Visit: Payer: Self-pay

## 2022-09-12 NOTE — Patient Outreach (Signed)
Care Coordination   Initial Visit Note   09/12/2022 Name: Randall Mccormick MRN: [id number] DOB: 06/01/1956  Randall Mccormick is a 67 y.o. year old male who sees Randall Dress, MD for primary care. I spoke with  Randall Mccormick by phone today.  What matters to the patients health and wellness today?  Patient discharged home from rehab with ability to walk with walker, get out of bed and go to the bathroom by himself.  Now that patient is home is not able to walk well.  Reports needing help with everything. Wife now has a broken foot from trying to help patient.  Wife and patient reports that they have a friend who will start helping on Monday for 4 hours per day.  Patient reports to me that he will be able to get into car to go to MD office. Reports that he has a wheelchair that he can use from car to MD office.  Patient has refused a lift for the home.  Wife and patient are both interested in patient going back to rehab.   Patient reports no bowel movement for 1 week. Reports taking Miralax daily.   Wife reports that patient have had severe difficulty with ambulation for the last 3 years but this is the worse it has been.  Patient is active with PT. Wife reports that patient is doing his home exercises 2-3 times per day and this does not seem to be helping.  Patient sounds like he has slurred speech.  When I inquired he states that it is because he has a dry mouth. Patient is able to verbalize year, month and president.    Goals Addressed               This Visit's Progress     Patient with difficulty with mobility and constipation (pt-stated)        Interventions Today    Flowsheet Row Most Recent Value  Chronic Disease   Chronic disease during today's visit Other  [weakness and mobility problems, constipation]  General Interventions   General Interventions Discussed/Reviewed General Interventions Discussed, Durable Medical Equipment (DME)  Durable Medical Equipment (DME) Randall Mccormick, Wheelchair   Wheelchair Standard  Exercise Interventions   Exercise Discussed/Reviewed Exercise Discussed  [Reviewed patient is active with PT]  Education Interventions   Education Provided Provided Education  Provided Verbal Education On Nutrition, Exercise, Medication, When to see the doctor  Pharmacy Interventions   Pharmacy Dicussed/Reviewed Pharmacy Topics Discussed, Medications and their functions  Safety Interventions   Safety Discussed/Reviewed Safety Discussed, Home Safety  Home Safety Assistive Devices     Reviewed use of suppositories for constipation. Patient has suppositories on hand. Reviewed use of fleets enema and other OTC for constipation. Reviewed with patient and wife that they feel like they can manage to get patient to the MD office. Reviewed patients request to go back to the nursing home. Social worker referral place.  Encouraged with to call MD office today for office visit appointment. Confirmed with wife she felt she can manage to get patient to MD office with assistance from a friend.           SDOH assessments and interventions completed:  Yes  SDOH Interventions Today    Flowsheet Row Most Recent Value  SDOH Interventions   Food Insecurity Interventions Intervention Not Indicated  Housing Interventions Intervention Not Indicated  Utilities Interventions Intervention Not Indicated  Physical Activity Interventions --  [Has home health PT. [facility].  2 times  per week.]        Care Coordination Interventions:  Yes, provided   Follow up plan: Referral made to social worker, I will call patient back in 1 week.     Encounter Outcome:  Pt. Visit Completed   Tomasa Rand, RN, BSN, CEN [place] Coordinator [phone]

## 2022-09-13 ENCOUNTER — Ambulatory Visit: Payer: Self-pay | Admitting: *Deleted

## 2022-09-13 NOTE — Patient Instructions (Signed)
Visit Information  Thank you for taking time to visit with me today. Please don't hesitate to contact me if I can be of assistance to you.   Following are the goals we discussed today:   Goals Addressed             This Visit's Progress    Go back to SNF rehab       Activities and task to complete in order to accomplish goals.   CSW collaborating with [facility] PT and Social Work for therapy notes and to assist with paperwork/FL2 for submission for "SNF FL2 to be completed by PCP and faxed to SNF  [facility] aware of potential need to return and hopeful for SNF bed next week- CSW plans to follow up on Monday Review private pay home care options provided and discussed Private arrangements for help at home made by wife for 4 hours/day starting Monday        Our next appointment is by telephone on 09/16/22  Please call the care guide team at [phone] if you need to cancel or reschedule your appointment.   If you are experiencing a Mental Health or [place] or need someone to talk to, please call the Suicide and Crisis Lifeline: 988 call 911   The patient verbalized understanding of instructions, educational materials, and care plan provided today and DECLINED offer to receive copy of patient instructions, educational materials, and care plan.   Telephone follow up appointment with care management team member scheduled for: 09/16/22  Eduard Clos, MSW, [place] Worker [facility] [employer] [facility] [phone]

## 2022-09-13 NOTE — Patient Outreach (Addendum)
  Care Coordination   Initial Visit Note   09/13/2022 Name: Randall Mccormick MRN: [id number] DOB: August 14, 1955  Randall Mccormick is a 67 y.o. year old male who sees [name], MD for primary care. I spoke with  [name] by phone today.  What matters to the patients health and wellness today?  Need to go back to SNF rehab.    Goals Addressed             This Visit's Progress    Go back to SNF rehab       Activities and task to complete in order to accomplish goals.   CSW collaborating with [facility] PT and Social Work for therapy notes and to assist with paperwork/FL2 for submission for "SNF FL2 to be completed by PCP and faxed to SNF  [facility] aware of potential need to return and hopeful for SNF bed next week- CSW plans to follow up on Monday Review private pay home care options provided and discussed Private arrangements for help at home made by wife for 4 hours/day starting Monday        SDOH assessments and interventions completed:  Yes  SDOH Interventions Today    Flowsheet Row Most Recent Value  SDOH Interventions   Housing Interventions Intervention Not Indicated  Transportation Interventions Intervention Not Indicated  Financial Strain Interventions Intervention Not Indicated  Physical Activity Interventions Other (Comments)  [CSW coordinating SNF rehab return due to physical decline at home and need for SNF therapies]  Stress Interventions Provide Counseling        Care Coordination Interventions:  Yes, provided  Interventions Today    Flowsheet Row Most Recent Value  Chronic Disease   Chronic disease during today's visit Other  [weakness/mobility issues with back/disc issues]  General Interventions   General Interventions Discussed/Reviewed General Interventions Discussed, General Interventions Reviewed, [employer], Communication with, Level of Care  [CSW contacted [place], [place] PT who indicates pt has become unsafe with  "leg giving way" and collapsing-  wife limited to caregiving duties due to her own foot injury/surgery.]  Museum/gallery conservator (DME) Lift Chair, Wheelchair, Psychologist, clinical with Therapist, sports, PCP/Specialists, Social Work  [CSW spoke with Shirlean Mylar, Development worker, community, and collaborating to get new [employer] completed and signed by PCP as  well as [place] PT notes faxed to [facility] SNF for possible re-admit.]  Level of [place]  Exercise Interventions   Exercise Discussed/Reviewed Physical Activity  [Unable to walk safely and with significant decline since SNF d/c on 09/05/22.]  Physical Activity Discussed/Reviewed Physical Activity Discussed, Types of exercise  [HHPT currently in home to provide therapy]  Education Interventions   Provided Verbal Education On Applications, [employer]  [Will assist with [employer] Scripts completion and PCP signature for SNF]  Mental Health Interventions   Mental Health Discussed/Reviewed Coping Strategies  Safety Interventions   Safety Discussed/Reviewed Safety Discussed, Fall Risk, Home Safety  [Wife is not able to provide level of assistance pt is requiring at home. Prior to SNF d/c was ambulating 150' independently but since returning home, has declined to a MOD/MAX Assist level per HHPT. Wife has helper coming to begin Monday for 4hrs/day]  Home Safety Contact home health agency  [CSW communicating and collaborating with HHPT and HHSW]       Follow up plan: Follow up call scheduled for 09/16/22    Encounter Outcome:  Pt. Visit Completed

## 2022-09-16 ENCOUNTER — Encounter: Payer: Self-pay | Admitting: *Deleted

## 2022-09-17 ENCOUNTER — Telehealth: Payer: Self-pay | Admitting: *Deleted

## 2022-09-18 ENCOUNTER — Ambulatory Visit: Payer: Self-pay | Admitting: *Deleted

## 2022-09-18 ENCOUNTER — Ambulatory Visit: Payer: Self-pay

## 2022-09-18 NOTE — Patient Outreach (Signed)
  Care Coordination   Follow Up Visit Note   09/18/2022 Name: Randall Mccormick MRN: [id number] DOB: 1956-04-19  Randall Mccormick is a 67 y.o. year old male who sees [name], MD for primary care. I spoke with  Randall Mccormick by phone today.  What matters to the patients health and wellness today?  Per wife things are going great.  Wife reports that he did not go to [facility].  Wife states to me that when patient was able to get his bowels moving that he has been able to walk better.  Wife reports things are 100% better.  Wife denies any concerns today. Reviewed case with [facility] social worker who states that patient no longer interested or needs to go to [facility].  Reviewed with wife pending follow up with Dr. Delena Mccormick for tomorrow.  Reviewed that wife now has help in the home 4 hours per day.    Goals Addressed               This Visit's Progress     Patient with difficulty with mobility and constipation (pt-stated)        Interventions Today    Flowsheet Row Most Recent Value  Chronic Disease   Chronic disease during today's visit Other  [constipation and mobility concerns]  General Interventions   General Interventions Discussed/Reviewed General Interventions Reviewed, Durable Medical Equipment (DME)  Communication with Social Work  Exercise Interventions   Exercise Discussed/Reviewed Physical Activity  [employer] Home health PT in with patient during this call.]  Education Interventions   Education Provided Provided Education  [Encouraged wife to talk with MD about long term plan for constipation.]  Pharmacy Interventions   Pharmacy Dicussed/Reviewed Medications and their functions, Pharmacy Topics Discussed  Safety Interventions   Safety Discussed/Reviewed Safety Discussed  [Reviewed with wife that she feels this is a safe plan of care.]              SDOH assessments and interventions completed:  No     Care Coordination Interventions:  Yes, provided   Follow up plan:  Follow up call scheduled for 09/25/2022    Encounter Outcome:  Pt. Visit Completed   Randall Rand, RN, BSN, CEN [place] Coordinator [phone]

## 2022-09-18 NOTE — Patient Instructions (Signed)
Visit Information  Thank you for taking time to visit with me today. Please don't hesitate to contact me if I can be of assistance to you.   Following are the goals we discussed today:   Goals Addressed             This Visit's Progress    COMPLETED: Go back to SNF rehab       Activities and task to complete in order to accomplish goals.   Per pt and wife they have decided to not further pursue SNF rehab due to pt's progress Pt will continue to get therapy from [place], [place] Worker, also involved and aware of plans Review private pay home care options provided and discussed Continue with your private arrangements for help at home   Wife to discuss long term plans/options further with daughter who arrives this weekend to visit        If you are experiencing a Mental Health or [place] or need someone to talk to, please call 911   The patient verbalized understanding of instructions, educational materials, and care plan provided today and DECLINED offer to receive copy of patient instructions, educational materials, and care plan.   No further follow up required:    Eduard Clos, MSW, [place] Worker [facility] [employer] [facility] [phone]

## 2022-09-18 NOTE — Patient Outreach (Signed)
  Care Coordination   Follow Up Visit Note   09/18/2022 Name: Randall Mccormick MRN: [id number] DOB: Sep 04, 1955  Randall Mccormick is a 67 y.o. year old male who sees [name], MD for primary care. I  communicated throughout day with pt/wife, HHSW, SNF and PCP office.  What matters to the patients health and wellness today?  Pt and wife are "on the fence" about needing and wanting to return to SNF. CSW has discussed in detail with wife the coverage for possible SNF return will be based on him meeting guidelines if returns and with PT assessment. As well, Medicare and secondary insurance coverage will begin where they stopped (not start over).    Goals Addressed             This Visit's Progress    Go back to SNF rehab       Activities and task to complete in order to accomplish goals.   CSW collaborating with [facility] PT and Social Work for therapy notes and to assist with paperwork/FL2 for submission for possible SNF FL2 to be completed by PCP and faxed to SNF tomorrow [facility] aware of potential need to return and has bed (holding until Wednesday only) Review private pay home care options provided and discussed Private arrangements for help at home made by wife for 4 hours/day starting Monday Wife to discuss plans/options with pt and daughter and make decision on SNF by tomorrow/Wednesday        SDOH assessments and interventions completed:  Yes     Care Coordination Interventions:  Yes, provided  Interventions Today    Flowsheet Row Most Recent Value  General Interventions   General Interventions Discussed/Reviewed Level of Care, [employer], Communication with  [Numerous calls with wife, HH, PCP and SNF for planning]  Communication with Social Work, PCP/Specialists  Level of [place]  [PCP alerted to complete FL2]  Exercise Interventions   Physical Activity Discussed/Reviewed Physical Activity Discussed  [per wife, pt  ambulating in home well]       Follow up plan: Follow up call scheduled for 09/18/22    Encounter Outcome:  Pt. Visit Completed

## 2022-09-18 NOTE — Patient Outreach (Signed)
  Care Coordination   Follow Up Visit Note   09/18/2022 Name: Randall Mccormick MRN: [id number] DOB: [birth year]  Randall Mccormick is a 67 y.o. year old male who sees [name], MD for primary care. I  spoke with wife, HHSW-Robin and SNF rep, Olivia Mackie.  What matters to the patients health and wellness today?  Plan to stay home with [facility] care and personal private care support as needed.  "He walked around the kitchen 4 times and his oxygen sats are 100% now".     Goals Addressed             This Visit's Progress    COMPLETED: Go back to SNF rehab       Activities and task to complete in order to accomplish goals.   Per pt and wife they have decided to not further pursue SNF rehab due to pt's progress Pt will continue to get therapy from [place], [place] Worker, also involved and aware of plans Review private pay home care options provided and discussed Continue with your private arrangements for help at home   Wife to discuss long term plans/options further with daughter who arrives this weekend to visit        SDOH assessments and interventions completed:  Yes     Care Coordination Interventions:  Yes, provided  Interventions Today    Flowsheet Row Most Recent Value  General Interventions   General Interventions Discussed/Reviewed Level of Care, Community Resources  Safety Interventions   Home Safety Contact home health agency       Follow up plan: No further intervention required.   Encounter Outcome:  Pt. Visit Completed

## 2022-09-24 ENCOUNTER — Encounter: Payer: Self-pay | Admitting: Neurology

## 2022-09-24 ENCOUNTER — Ambulatory Visit (INDEPENDENT_AMBULATORY_CARE_PROVIDER_SITE_OTHER): Payer: MEDICARE | Admitting: Neurology

## 2022-09-24 ENCOUNTER — Telehealth: Payer: Self-pay | Admitting: Neurology

## 2022-09-24 VITALS — BP 125/65 | HR 84 | Ht 68.0 in | Wt 235.0 lb

## 2022-09-24 DIAGNOSIS — R29898 Other symptoms and signs involving the musculoskeletal system: Secondary | ICD-10-CM | POA: Diagnosis not present

## 2022-09-24 DIAGNOSIS — K5900 Constipation, unspecified: Secondary | ICD-10-CM | POA: Diagnosis not present

## 2022-09-24 DIAGNOSIS — R269 Unspecified abnormalities of gait and mobility: Secondary | ICD-10-CM | POA: Diagnosis not present

## 2022-09-24 DIAGNOSIS — Z981 Arthrodesis status: Secondary | ICD-10-CM | POA: Diagnosis not present

## 2022-09-24 DIAGNOSIS — K592 Neurogenic bowel, not elsewhere classified: Secondary | ICD-10-CM

## 2022-09-24 NOTE — Progress Notes (Signed)
[facility]  PATIENT: Randall Mccormick DOB: [birth year]  REFERRING DOCTOR OR PCP: Annamarie Major MD; Nelda Bucks, MD SOURCE: Patient, notes from primary care, neurosurgery.  Imaging and lab reports, MRI images personally reviewed.  _________________________________   HISTORICAL  CHIEF COMPLAINT:  Chief Complaint  Patient presents with   Room 10    Pt is here with his Daughter. Pt states that he can't walk. Pt states that his legs are numb and hurting. Pt's daughter states that he's had 6 back surgeries. Pt states that he has bathroom issues. Pt's daughter states that pt has vision problems.     HISTORY OF PRESENT ILLNESS:  I had the pleasure seeing your patient, Randall Mccormick, at [facility] for neurologic consultation regarding his leg weakness and severe constipation.  He is a 67 year old man who has very limited gait and frequent bowel impaction.   He has been mostly wheelchair bound bound after having thoracic spine surgery ([facility]) in 2022.  He had T10-pelvis fusion 11/23/2019 (Dr. 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  After the surgery, he noted he could not feel his legs x 3 days.  Sensation came back for the most part.   Before that last  surgery, he felt he could walk fairly well with a walker.    He has had constipation and frequent impaction since that time as well.       Currently, he is able to use the walker to go about 150 feet (some days more, others less).   He has had some falls.    He has mild weakness in legs.    He has numbness in the legs.   Right toes curl under.        He has leg pain, especially in the thighs.    He takes oxycodone 5 mg po bid.      He takes baclofen 5 mg po bid.  For the constipation, he takes Miralax daily and gets plenty of liquids in.     His daughter has MS and wondered if he  might have a diagnosis.  IMAGING: MRI thoracic and lumbar spine 06/25/2022 showed fusion from T8 to the SI joint.  THe spinal cord appeared normal.    MRI brain 04/09/2022 showed mild atrophy and mild chronic ischemic changes -- typical for age    REVIEW OF SYSTEMS: Constitutional: No fevers, chills, sweats, or change in appetite Eyes: No visual changes, double vision, eye pain Ear, nose and throat: No hearing loss, ear pain, nasal congestion, sore throat Cardiovascular: No chest pain, palpitations Respiratory:  No shortness of breath at rest or with exertion.   No wheezes GastrointestinaI: No nausea, vomiting, diarrhea, abdominal pain, fecal incontinence Genitourinary:  No dysuria, urinary retention or frequency.  No nocturia. Musculoskeletal:  No neck pain, back pain Integumentary: No rash, pruritus, skin lesions Neurological: as above Psychiatric: No depression at this time.  No anxiety Endocrine: No palpitations, diaphoresis, change in appetite, change in weigh or increased thirst Hematologic/Lymphatic:  No anemia, purpura, petechiae. Allergic/Immunologic: No itchy/runny eyes, nasal congestion, recent allergic reactions, rashes  ALLERGIES: No Known Allergies  HOME MEDICATIONS:  Current Outpatient Medications:    acetaminophen (TYLENOL) 325 MG tablet, Take 975 mg by mouth every 8 (eight) hours as needed for moderate pain., Disp: , Rfl:    allopurinol (ZYLOPRIM) 300 MG tablet, Take 300 mg by  mouth daily., Disp: , Rfl:    ALPRAZolam (XANAX) 0.5 MG tablet, Take 0.5 mg by mouth as needed for anxiety., Disp: , Rfl:    amLODipine (NORVASC) 5 MG tablet, Take 5 mg by mouth daily., Disp: , Rfl:    baclofen 5 MG TABS, Take 5 mg by mouth 3 (three) times daily. (Patient taking differently: Take 20 mg by mouth in the morning and at bedtime.), Disp: 30 each, Rfl: 0   ezetimibe (ZETIA) 10 MG tablet, Take 10 mg by mouth at bedtime., Disp: , Rfl:    FLUoxetine (PROZAC) 20 MG capsule, Take 20 mg by  mouth daily., Disp: , Rfl:    gabapentin (NEURONTIN) 300 MG capsule, Take 1 capsule (300 mg total) by mouth 3 (three) times daily., Disp: 270 capsule, Rfl: 1   meloxicam (MOBIC) 7.5 MG tablet, Take 7.5 mg by mouth daily., Disp: , Rfl:    OLANZapine (ZYPREXA) 10 MG tablet, Take 1 tablet (10 mg total) by mouth at bedtime., Disp: , Rfl:    oxycodone (OXY-IR) 5 MG capsule, Take 5 mg by mouth every 4 (four) hours as needed., Disp: , Rfl:    oxymetazoline (AFRIN) 0.05 % nasal spray, Place 1 spray into both nostrils 2 (two) times daily., Disp: , Rfl:    tamsulosin (FLOMAX) 0.4 MG CAPS capsule, TAKE 1 CAPSULE EVERY DAY AFTER SUPPER (Patient taking differently: Take 0.4 mg by mouth every evening.), Disp: 90 capsule, Rfl: 1   aspirin 81 MG chewable tablet, Chew 1 tablet (81 mg total) by mouth 2 (two) times daily. (Patient not taking: Reported on 09/24/2022), Disp: 60 tablet, Rfl: 0   bisacodyl (DULCOLAX) 5 MG EC tablet, Take 10 mg by mouth daily as needed for moderate constipation. (Patient not taking: Reported on 09/12/2022), Disp: , Rfl:    Lactulose 20 GM/30ML SOLN, Take 20 g by mouth daily as needed (constipation). (Patient not taking: Reported on 09/12/2022), Disp: , Rfl:    lisinopril (ZESTRIL) 40 MG tablet, Take 40 mg by mouth daily., Disp: , Rfl:    Magnesium 400 MG TABS, Take 200 mg by mouth daily. (Patient not taking: Reported on 09/12/2022), Disp: , Rfl:    Magnesium Hydroxide (MILK OF MAGNESIA PO), Take 30 mLs by mouth daily. (Patient not taking: Reported on 09/12/2022), Disp: , Rfl:    metroNIDAZOLE (METROCREAM) 0.75 % cream, Apply 1 Application topically daily as needed (rosacea). (Patient not taking: Reported on 09/24/2022), Disp: , Rfl:    nystatin powder, Apply 1 Application topically 2 (two) times daily. Discontinue once areas have healed (Patient not taking: Reported on 09/12/2022), Disp: , Rfl:    pantoprazole (PROTONIX) 40 MG tablet, Take 1 tablet (40 mg total) by mouth 2 (two) times daily., Disp:  102 tablet, Rfl: 0   rivaroxaban (XARELTO) 10 MG TABS tablet, Take 1 tablet (10 mg total) by mouth daily after supper for 16 days., Disp: 16 tablet, Rfl: 0   triamcinolone (KENALOG) 0.025 % cream, Apply 1 Application topically daily as needed (peeling skin). (Patient not taking: Reported on 09/24/2022), Disp: , Rfl:    tuberculin 5 UNIT/0.1ML injection, Inject 5 Units into the skin once. One dose on 04/02/22. Next dose on 04/09/22 per MAR. (Patient not taking: Reported on 09/12/2022), Disp: , Rfl:   PAST MEDICAL HISTORY: Past Medical History:  Diagnosis Date   Anxiety    Cobalamin deficiency    Degeneration of lumbar or lumbosacral intervertebral disc    Depression    Gouty arthropathy, unspecified  Hemorrhoids    Hyperlipidemia    Hypertension    Multiple fractures of ribs of left side 04/20/2013   FALL FROM A LADDER ON    Vitamin D deficiency     PAST SURGICAL HISTORY: Past Surgical History:  Procedure Laterality Date   APPENDECTOMY     BACK SURGERY     x 6   CHOLECYSTECTOMY  06/24/2008   [facility]   ESOPHAGOGASTRODUODENOSCOPY N/A 04/06/2022   Procedure: ESOPHAGOGASTRODUODENOSCOPY (EGD);  Surgeon: Doran Stabler, MD;  Location: [place];  Service: Gastroenterology;  Laterality: N/A;   TONSILLECTOMY     TOTAL KNEE ARTHROPLASTY Right 03/28/2022   Procedure: RIGHT TOTAL KNEE ARTHROPLASTY;  Surgeon: Meredith Pel, MD;  Location: [place];  Service: Orthopedics;  Laterality: Right;    FAMILY HISTORY: Family History  Problem Relation Age of Onset   Cancer Mother        BREAST   Heart disease Father    Alcoholism Father    Cancer Son        LEUKEMIA    SOCIAL HISTORY: Social History   Socioeconomic History   Marital status: Married    Spouse name: [name]   Number of children: 1   Years of education: Not on file   Highest education level: Not on file  Occupational History   Not on file  Tobacco Use   Smoking status: Never   Smokeless tobacco: Former     Types: Chew    Quit date: 2022  Vaping Use   Vaping Use: Never used  Substance and Sexual Activity   Alcohol use: Yes    Alcohol/week: 1.0 standard drink of alcohol    Types: 1 Shots of liquor per week    Comment: 2 per night   Drug use: Not Currently   Sexual activity: Not Currently  Other Topics Concern   Not on file  Social History Narrative   Right Handed   1 Cup of Coffee   Social Determinants of Health   Financial Resource Strain: Low Risk  (09/13/2022)   Overall Financial Resource Strain (CARDIA)    Difficulty of Paying Living Expenses: Not hard at all  Food Insecurity: No Food Insecurity (09/12/2022)   Hunger Vital Sign    Worried About Running Out of Food in the Last Year: Never true    Ran Out of Food in the Last Year: Never true  Transportation Needs: No Transportation Needs (09/13/2022)   PRAPARE - Hydrologist (Medical): No    Lack of Transportation (Non-Medical): No  Physical Activity: Inactive (09/13/2022)   Exercise Vital Sign    Days of Exercise per Week: 0 days    Minutes of Exercise per Session: 0 min  Stress: Stress Concern Present (09/13/2022)   Ewa Gentry    Feeling of Stress : Rather much  Social Connections: Not on file  Intimate Partner Violence: Not At Risk (09/12/2022)   Humiliation, Afraid, Rape, and Kick questionnaire    Fear of Current or Ex-Partner: No    Emotionally Abused: No    Physically Abused: No    Sexually Abused: No       PHYSICAL EXAM  Vitals:   09/24/22 1017  BP: 125/65  Pulse: 84  Weight: 235 lb (106.6 kg)  Height: 5\' 8"  (1.727 m)    Body mass index is 35.73 kg/m.    General: The patient is well-developed and well-nourished and in no acute  distress.  He is wheelchair-bound.  Surgical scar on back and neck  HEENT:  Head is [place]/AT.  Sclera are anicteric.   Neck: No carotid bruits are noted.  The neck is  nontender.  Cardiovascular: The heart has a regular rate and rhythm with a normal S1 and S2. There were no murmurs, gallops or rubs.    Skin: Extremities are without rash or  edema.   Neurologic Exam  Mental status: The patient is alert and oriented x 3 at the time of the examination. The patient has apparent normal recent and remote memory, with an apparently normal attention span and concentration ability.   Speech is normal.  Cranial nerves: Extraocular movements are full.  Facial symmetry is present. There is good facial sensation to soft touch bilaterally.Facial strength is normal.  Trapezius and sternocleidomastoid strength is normal. No dysarthria is noted.  The tongue is midline, and the patient has symmetric elevation of the soft palate. No obvious hearing deficits are noted.  Motor:  Muscle bulk is normal.   Tone is normal. Strength is  5 / 5 in the arms.  Strength is 4 in the iliopsoas and EHL muscles and 4+/5 elsewhere in the legs  Sensory: Sensory testing is intact to pinprick, soft touch and vibration sensation in the arms and he reports moderate loss of vibration sensation at the toes but only mild loss of vibration at the ankles.  He also has edema at the ankles which could affect this testing) pinprick sensation was normal at the ankle and mildly reduced in the toes, more so in the L5 distribution than the S1 distribution.  Coordination: Cerebellar testing reveals good finger-nose-finger and heel-to-shin was proportional to strength  Gait and station: He is in a wheelchair.  Reflexes: Deep tendon reflexes are symmetric and normal in  arms, 2 at knees and 1 at ankles.    Plantar responses are flexor.    DIAGNOSTIC DATA (LABS, IMAGING, TESTING) - I reviewed patient records, labs, notes, testing and imaging myself where available.  Lab Results  Component Value Date   WBC 15.6 (H) 04/12/2022   HGB 9.3 (L) 04/12/2022   HCT 27.9 (L) 04/12/2022   MCV 101.5 (H) 04/12/2022    PLT 400 04/12/2022      Component Value Date/Time   NA 135 04/12/2022 0248   K 4.4 04/12/2022 0248   CL 98 04/12/2022 0248   CO2 29 04/12/2022 0248   GLUCOSE 110 (H) 04/12/2022 0248   BUN 7 (L) 04/12/2022 0248   CREATININE 0.76 04/12/2022 0248   CALCIUM 8.4 (L) 04/12/2022 0248   PROT 4.4 (L) 04/07/2022 0732   ALBUMIN 1.9 (L) 04/07/2022 0732   AST 48 (H) 04/07/2022 0732   ALT 37 04/07/2022 0732   ALKPHOS 60 04/07/2022 0732   BILITOT 1.0 04/07/2022 0732   GFRNONAA >60 04/12/2022 0248   Lab Results  Component Value Date   TRIG 217 (H) 04/07/2022   Lab Results  Component Value Date   HGBA1C 5.3 04/06/2022   Lab Results  Component Value Date   VITAMINB12 351 04/06/2022   No results found for: "TSH"     ASSESSMENT AND PLAN  Gait disorder - Plan: MR CERVICAL SPINE WO CONTRAST  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 Poole is a 67 year old man with mild leg weakness, poor gait and severe constipation.  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  Because of the metal artifact, I cannot view the actual spinal cord at those 2 levels on the MRI images.  I suspect that he likely has some myelopathy playing a role in both the leg weakness and the bowel disturbance.  MRI of the thoracic and lumbar spine showed the fusion which appears to be stable.  We need to check an MRI of the cervical spine to determine if there is cervical myelopathy.  He did have cervical fusion though is unsure what level about 18 to 20 years ago.  Based on the results further intervention may be necessary.  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  With a severe constipation, he is advised to continue the MiraLAX nightly.  As he does not seem to be getting benefit from the baclofen we will stop that medication as it is possible it is affecting the bowel some.  We also discussed trying to back off on the oxycodone to just once a day as it can also affect bowel.  As it can also affect bowel.  I did not schedule follow-up but they are advised to call if new or worsening symptoms and we might schedule based on the results of the studies.  Thank you for asked me to see Mr. Banther.  Please let me know if I can be of further assistance with him or other patients in the future.     Damiel Barthold A. Felecia Shelling, MD, [facility] [id number], [id number] AM Certified in Neurology, Clinical Neurophysiology, Sleep Medicine and Neuroimaging  [facility] [address] [place], [place] [address] [phone]

## 2022-09-24 NOTE — Telephone Encounter (Signed)
medicare NPR sent to GI [phone] 

## 2022-09-25 ENCOUNTER — Ambulatory Visit: Payer: Self-pay

## 2022-09-25 NOTE — Patient Outreach (Signed)
  Care Coordination   Follow Up Visit Note   09/25/2022 Name: Randall Mccormick MRN: [id number] DOB: 08-Feb-1956  Randall Mccormick is a 67 y.o. year old male who sees [name], MD for primary care. I spoke with  [name] by phone today. Spoke with wife.  What matters to the patients health and wellness today?  Wife reports that patient is doing better, Reports that he is walking with walker. Reports bowels are still a concerns. States that when he starts to go to the bathroom then he has diarrhea for 2-3 days.  Wife reports that PCP appointment went well and she did not have any issues getting patient to the MD appointment. Reports that patients caregiver went with them.  Wife reports neurology appointment yesterday and patient will be scheduled for an MRI.   Wife reports home health aid came today and assisted with bath.  Reports PT is going well.     Goals Addressed               This Visit's Progress     Patient with difficulty with mobility and constipation (pt-stated)        Interventions Today    Flowsheet Row Most Recent Value  Chronic Disease   Chronic disease during today's visit Other  [ambulation difficulties and constipation]  General Interventions   General Interventions Discussed/Reviewed General Interventions Reviewed, Doctor Visits  Doctor Visits Discussed/Reviewed Doctor Visits Discussed, Specialist, PCP  PCP/Specialist Visits Compliance with follow-up visit  Exercise Interventions   Physical Activity Discussed/Reviewed Physical Activity Discussed  [Encouraged patient to continue to do home exercises and work with PT]  Education Interventions   Provided Verbal Education On Exercise, When to see the doctor  Pharmacy Interventions   Pharmacy Dicussed/Reviewed Medications and their functions      Follow up planned for 2 weeks.        SDOH assessments and interventions completed:  No     Care Coordination Interventions:  Yes, provided   Follow up plan:  Follow up call scheduled for 10/09/2022    Encounter Outcome:  Pt. Visit Completed   Tomasa Rand, RN, BSN, CEN [place] Coordinator [phone]

## 2022-09-29 ENCOUNTER — Other Ambulatory Visit: Payer: Self-pay | Admitting: Physical Medicine and Rehabilitation

## 2022-09-30 ENCOUNTER — Ambulatory Visit
Admission: RE | Admit: 2022-09-30 | Discharge: 2022-09-30 | Disposition: A | Discharge status: Home / Self Care | Payer: MEDICARE | Source: Ambulatory Visit | Attending: Neurology | Admitting: Neurology

## 2022-09-30 DIAGNOSIS — R269 Unspecified abnormalities of gait and mobility: Secondary | ICD-10-CM | POA: Diagnosis not present

## 2022-09-30 DIAGNOSIS — K5900 Constipation, unspecified: Secondary | ICD-10-CM

## 2022-10-09 ENCOUNTER — Ambulatory Visit: Payer: Self-pay

## 2022-10-09 NOTE — Patient Outreach (Signed)
  Care Coordination   Follow Up Visit Note   10/09/2022 Name: Randall Mccormick MRN: [id number] DOB: 28-Nov-1955  Randall Mccormick is a 67 y.o. year old male who sees Paulina Fusi, MD for primary care. I spoke with  Randall Mccormick by phone today.  What matters to the patients health and wellness today?    Follow up call with patient wife.  Wife reports that patient is doing "great".  States that is walking with his walker. Reports that he walked outside about 200 feet with PT.  PT wants patient to stand more and so he is practicing standing at the kitchen counter.  Wife states it is a miracle.  Reports that patient is in better spirits.  Wife reports that patient is walking to the bathroom 85% of the time. Reports continued concern for constipation. Wife reports that patient often has 1 large hard bowel movement a week followed by 2-3 days of diarrhea. Wife reports last week she had to add MOM and magnesium of citrate 1/2 bottle to get patient to have a bowel movement. Reports patient is very gassy.  Patient has a GI appointment for May.    Goals Addressed               This Visit's Progress     Patient with difficulty with mobility and constipation (pt-stated)        Interventions Today    Flowsheet Row Most Recent Value  Chronic Disease   Chronic disease during today's visit Other  [Constipation and mobility concerns]  General Interventions   General Interventions Discussed/Reviewed General Interventions Discussed, Doctor Visits  Exercise Interventions   Exercise Discussed/Reviewed Exercise Reviewed, Physical Activity  Physical Activity Discussed/Reviewed Home Exercise Program (HEP)  [Encouraged patient to continue to walk and stand as much as possible to help his strength]  Education Interventions   Education Provided Provided Education  [Discussed concerns for constipation with wife, Encouraged wife to write on calendar when patient has a BM to keep up with it and take this to GI  appointment.]  Provided Verbal Education On Exercise, When to see the doctor  Pharmacy Interventions   Pharmacy Dicussed/Reviewed Medications and their functions  [Reviewed with wife to try colace  twice a day as a stool softner. Patient is currently taking .  Reviewed try the stool softner for 1 week.  Hopefully this will help. If not try to add a second dose of Mirlax every other day.]  Safety Interventions   Safety Discussed/Reviewed Home Safety, Fall Risk  Home Safety Assistive Devices      Reviewed with wife that she continues to have help in the home 4 hours per day and this is all working well. Wife is very happy with patients progress.         SDOH assessments and interventions completed:  No     Care Coordination Interventions:  Yes, provided   Follow up plan: Follow up call scheduled for May 1    Encounter Outcome:  Pt. Visit Completed   Rowe Pavy, RN, BSN, CEN [facility] Coordinator [phone]

## 2022-10-23 ENCOUNTER — Ambulatory Visit: Payer: Self-pay

## 2022-10-23 NOTE — Patient Outreach (Signed)
  Care Coordination   Initial Visit Note   10/23/2022 Name: Randall Mccormick MRN: [id number] DOB: Mar 30, 1956  Randall Mccormick is a 67 y.o. year old male who sees Paulina Fusi, MD for primary care. I spoke with  Randall Mccormick by phone today. Spoke with patient and wife via phone.  What matters to the patients health and wellness today?  Follow up call. Wife reports that patient is doing great. Reports that his bowel are moving daily or every other day.  Reports patient is taking 2 stool softeners in the morning and 2 stool softeners in the evening.  Reports takes Miralax once a day.    If no BM that day, patient takes 2 doses of Miralax the next day.  Wife reports a huge improvement.  Reports patient continues to be active with PT and is walking daily. Reports walked to mail box yesterday with walker.  Will continue PT for 1 more week. Wife reports patient is walking to the [place] every time.  Wife continues to have a caregiver assist her 4 hours per day. Wife reports patient has lost about 20 pounds. Reports decrease in appetite.  States patient has a boost for breakfast, lite lunch and a meat and veggies for dinner.  Reports he eats until he is full.  Wife reports patient is sleeping well.     Goals Addressed               This Visit's Progress     Patient with difficulty with mobility and constipation (pt-stated)        Interventions Today    Flowsheet Row Most Recent Value  Chronic Disease   Chronic disease during today's visit Other  [constipation and poor mobility]  General Interventions   General Interventions Discussed/Reviewed General Interventions Discussed, Doctor Visits  Doctor Visits Discussed/Reviewed Doctor Visits Discussed  Exercise Interventions   Exercise Discussed/Reviewed Exercise Reviewed, Physical Activity, Weight Managment, Assistive device use and maintanence  [Encouraged patient to continued to do home exercises when PT is finished. Reviewed importance of being able  to continue to be mobile.  Wife reports patient is more motivated at this time.  Reviewed use of assistive devcices- walker.]  Physical Activity Discussed/Reviewed Physical Activity Discussed, Physical Activity Reviewed, Types of exercise, Home Exercise Program (HEP)  Weight Management Weight maintenance  [Reviewed importance of healthy nutrition for muscle health and brain energy.]  Education Interventions   Education Provided Provided Education  Provided Verbal Education On Nutrition, Exercise, Medication, When to see the doctor  Nutrition Interventions   Nutrition Discussed/Reviewed Nutrition Discussed, Supplmental nutrition  [discussed healthy nutrition and importance of maintainiing a healthy weight.]  Pharmacy Interventions   Pharmacy Dicussed/Reviewed Medications and their functions  Safety Interventions   Safety Discussed/Reviewed Fall Risk  Home Safety Assistive Devices      Congratulated patient on having a better bowel regimen.  Reviewed all the progress patient has made.  Wife and patient are very happy with all the progress.  States "it is a miracle" Encouraged wife to continue to take care of herself.        SDOH assessments and interventions completed:  No     Care Coordination Interventions:  Yes, provided   Follow up plan: Follow up call scheduled for 11/25/2022    Encounter Outcome:  Pt. Visit Completed   Rowe Pavy, RN, BSN, CEN [facility] Coordinator [phone]

## 2022-11-25 ENCOUNTER — Ambulatory Visit: Payer: Self-pay

## 2022-11-25 ENCOUNTER — Other Ambulatory Visit: Payer: Self-pay | Admitting: Physical Medicine and Rehabilitation

## 2022-11-25 NOTE — Patient Outreach (Signed)
  Care Coordination   Follow Up Visit Note   11/25/2022 Name: Randall Mccormick MRN: [id number] DOB: Nov 07, 1955  Randall Mccormick is a 67 y.o. year old male who sees Paulina Fusi, MD for primary care. I spoke with  [name] by phone today.  What matters to the patients health and wellness today?  Follow up call with patient and wife today.  Wife and patient report that he is doing great.  Reports patient had a follow up with GI and was recommended to take Miralax 1 dose every am.  If no BM by lunch to take another dose. If no BM by dinner to take another dose.  Wife states that patient is taking Miralax daily and a stool softner twice a day.  Wife reports that patient is able to have a bowel movement daily.  Wife reports that patient is able to use his walker and go anywhere he wants or needs to.  Denies any new problems or concerns today.     Goals Addressed               This Visit's Progress     Patient with difficulty with mobility and constipation (pt-stated)        Interventions Today    Flowsheet Row Most Recent Value  Chronic Disease   Chronic disease during today's visit Other  [Mobility issues and constipation]  General Interventions   General Interventions Discussed/Reviewed General Interventions Reviewed  Exercise Interventions   Exercise Discussed/Reviewed Physical Activity, Assistive device use and maintanence  Physical Activity Discussed/Reviewed Physical Activity Reviewed, Types of exercise, Home Exercise Program (HEP)  Education Interventions   Education Provided Provided Education  [reviewed with patient the importance of staying active.  Encouraged patient and provided support and praise for his progress.]  Provided Verbal Education On Exercise, Medication, When to see the doctor  Nutrition Interventions   Nutrition Discussed/Reviewed Nutrition Reviewed  Pharmacy Interventions   Pharmacy Dicussed/Reviewed Medications and their functions  [Reviewed new plan from  GI for meds for constipation.  reviewed that patient is having good BM's.]  Safety Interventions   Safety Discussed/Reviewed Fall Risk              SDOH assessments and interventions completed:  No     Care Coordination Interventions:  Yes, provided   Follow up plan: Follow up call scheduled for 01/27/2023    Encounter Outcome:  Pt. Visit Completed   Rowe Pavy, RN, BSN, CEN [facility] Coordinator [phone]

## 2022-12-10 ENCOUNTER — Ambulatory Visit: Payer: MEDICARE | Admitting: Podiatry

## 2022-12-11 DIAGNOSIS — M6250 Muscle wasting and atrophy, not elsewhere classified, unspecified site: Secondary | ICD-10-CM | POA: Insufficient documentation

## 2022-12-11 DIAGNOSIS — M21379 Foot drop, unspecified foot: Secondary | ICD-10-CM | POA: Insufficient documentation

## 2022-12-11 DIAGNOSIS — R262 Difficulty in walking, not elsewhere classified: Secondary | ICD-10-CM | POA: Insufficient documentation

## 2022-12-11 DIAGNOSIS — G959 Disease of spinal cord, unspecified: Secondary | ICD-10-CM | POA: Insufficient documentation

## 2023-01-27 ENCOUNTER — Ambulatory Visit: Payer: Self-pay

## 2023-01-27 NOTE — Patient Outreach (Signed)
  Care Coordination   Follow Up Visit Note   01/27/2023 Name: Lenoxx Cassada MRN: [id number] DOB: [birth year]  Amarious Berst is a 67 y.o. year old male who sees Paulina Fusi, MD for primary care. I spoke with  [name] by phone today.  What matters to the patients health and wellness today?  Patient and wife report that patient recently had pneumonia and Covid.  Reports 2 recent admission and rehab admission.  Patient reports no energy.  Home health back in and patient is trying to regain his strength.   Multiple falls.   No injury.   Wife reports that patient his able to get around the home. Wife reports that patient is regaining strength.  Continues to have a friend come in to help her.   Wife reports that bowels are moving on a regular basis.. Continues to take miralax.     Goals Addressed               This Visit's Progress     Patient with difficulty with mobility and constipation (pt-stated)        Interventions Today    Flowsheet Row Most Recent Value  Chronic Disease   Chronic disease during today's visit Other  [recent admission for pneumonia and COVID]  General Interventions   General Interventions Discussed/Reviewed General Interventions Reviewed, Sick Day Rules, Doctor Visits  Doctor Visits Discussed/Reviewed Doctor Visits Discussed  [Pending PCP office visit this week.  Wife states she will review medications with MD]  Exercise Interventions   Exercise Discussed/Reviewed Physical Activity  Physical Activity Discussed/Reviewed Home Exercise Program (HEP), Physical Activity Reviewed  Jorje Guild with home health PT]  Education Interventions   Education Provided Provided Education  [Reviewed importance of fall prevention, hydration and good nutrition. Reviewed importance of exercise and rest.]  Provided Verbal Education On Nutrition, Exercise, Medication, When to see the doctor  Mental Health Interventions   Mental Health Discussed/Reviewed Other  [Provided a listening  ear for wife, offered support.]  Nutrition Interventions   Nutrition Discussed/Reviewed Nutrition Discussed  Pharmacy Interventions   Pharmacy Dicussed/Reviewed Medications and their functions  Safety Interventions   Safety Discussed/Reviewed Fall Risk              SDOH assessments and interventions completed:  No     Care Coordination Interventions:  Yes, provided   Follow up plan: Follow up call scheduled for 02/17/2023    Encounter Outcome:  Pt. Visit Completed   Rowe Pavy, RN, BSN, CEN [facility] Coordinator [phone]

## 2023-02-20 ENCOUNTER — Ambulatory Visit: Payer: Self-pay

## 2023-02-20 NOTE — Patient Outreach (Signed)
  Care Coordination   Follow Up Visit Note   02/20/2023 Name: Randall Mccormick MRN: [id number] DOB: 02/07/1956  Randall Mccormick is a 67 y.o. year old male who sees Randall Fusi, MD for primary care. I spoke with  [name] by phone today.  What matters to the patients health and wellness today?  Called and spoke with wife and she states that patient is at [facility] with pneumonia. Has been admitted for 2 days.  Wife states that patient is improving.       SDOH assessments and interventions completed:  No     Care Coordination Interventions:  Yes, provided  Interventions Today    Flowsheet Row Most Recent Value  Chronic Disease   Chronic disease during today's visit Other  [in patient admission with pneumonia]  Mental Health Interventions   Mental Health Discussed/Reviewed Other  [offered support and listening ear for wife.  Encouraged wife to call me if needed]        Follow up plan: Follow up call scheduled for 02/27/2023    Encounter Outcome:  Pt. Visit Completed   Rowe Pavy, RN, BSN, CEN [facility] Coordinator [phone]

## 2023-02-27 ENCOUNTER — Ambulatory Visit: Payer: Self-pay

## 2023-02-27 NOTE — Patient Outreach (Signed)
  Care Coordination   Follow Up Visit Note   02/27/2023 Name: Randall Mccormick MRN: [id number] DOB: [birth year]  Randall Mccormick is a 67 y.o. year old male who sees Paulina Fusi, MD for primary care. I spoke with  [name] by phone today.  What matters to the patients health and wellness today?  Spoke with wife today and she reports that patient is home from the hospital. Reports that patient is doing well. No changes in medications.   Reports diarrhea today and patient needed to cancel his office visit today.   Wife reports that patient is up walking around and doing normal activities.     Goals Addressed               This Visit's Progress     COMPLETED: Patient with difficulty with mobility and constipation (pt-stated)        Interventions Today    Flowsheet Row Most Recent Value  Chronic Disease   Chronic disease during today's visit Other  [pneumonia]  General Interventions   General Interventions Discussed/Reviewed General Interventions Discussed, Doctor Visits  Doctor Visits Discussed/Reviewed Doctor Visits Discussed  Exercise Interventions   Exercise Discussed/Reviewed Physical Activity  Physical Activity Discussed/Reviewed Types of exercise  Education Interventions   Education Provided Provided Education  [Reviewed importance of timely follow up. reviewed importance of recognition of early symtoms of illness and calling MD.]  Provided Verbal Education On Nutrition, Medication, Exercise, When to see the doctor  Nutrition Interventions   Nutrition Discussed/Reviewed Nutrition Discussed  Pharmacy Interventions   Pharmacy Dicussed/Reviewed Medications and their functions  Safety Interventions   Safety Discussed/Reviewed Fall Risk, Home Safety      Wife feels like patient is doing well and no longer needs care coordination. Will close case and encouraged wife to call me if needed.         SDOH assessments and interventions completed:  No     Care Coordination  Interventions:  Yes, provided   Follow up plan: No further intervention required.   Encounter Outcome:  Patient Visit Completed   Rowe Pavy, RN, BSN, [facility] Coordinator [phone]

## 2023-05-12 DIAGNOSIS — M545 Low back pain, unspecified: Secondary | ICD-10-CM | POA: Insufficient documentation

## 2023-06-10 ENCOUNTER — Encounter: Payer: Self-pay | Admitting: Podiatry

## 2023-06-10 ENCOUNTER — Ambulatory Visit (INDEPENDENT_AMBULATORY_CARE_PROVIDER_SITE_OTHER): Payer: MEDICARE | Admitting: Podiatry

## 2023-06-10 DIAGNOSIS — B351 Tinea unguium: Secondary | ICD-10-CM

## 2023-06-10 DIAGNOSIS — G5793 Unspecified mononeuropathy of bilateral lower limbs: Secondary | ICD-10-CM | POA: Diagnosis not present

## 2023-06-10 DIAGNOSIS — M79674 Pain in right toe(s): Secondary | ICD-10-CM

## 2023-06-10 DIAGNOSIS — M79675 Pain in left toe(s): Secondary | ICD-10-CM

## 2023-06-10 DIAGNOSIS — M2042 Other hammer toe(s) (acquired), left foot: Secondary | ICD-10-CM

## 2023-06-10 DIAGNOSIS — M2041 Other hammer toe(s) (acquired), right foot: Secondary | ICD-10-CM | POA: Diagnosis not present

## 2023-06-11 NOTE — Progress Notes (Signed)
  Subjective:  Patient ID: Randall Mccormick, male    DOB: 07-May-1956,  MRN: [id number]  Chief Complaint  Patient presents with   Foot Care    Not diabetic. Takes ASA 81 mg. Needs nails trimmed. RT 3rd toe nail appears black, like it may fall off. He hits the toe nails on is walker repeatedly.     67 y.o. male presents with the above complaint. History confirmed with patient.  Patient presents with painful thickened elongated nails x5 on both feet.   Has pain with pressure on the nails. He and his wife are unable to trim his nails due to their thickness and mobility issues.  He does have a history of spinal injury and multiple spine surgeries which have resulted in some neurological deficits and neuropathy in both feet.  This has caused bilateral foot deformity and hammertoe contractures.   Objective:  Physical Exam: warm, good capillary refill, nail exam onychomycosis of the toenails with thickening with pain and elongation of the lesser toenails on both feet, no trophic changes or ulcerative lesions. DP pulses palpable, PT pulses palpable, and protective sensation absent Left Foot: Hammertoe of the lesser digits left foot 1 through 5, painful, rigid, mildly reducible Right Foot: Hammertoe of the lesser digits right foot 1 through 5, less painful, semireducible  No images are attached to the encounter.  Assessment:   1. Pain due to onychomycosis of toenails of both feet   2. Neuropathy involving both lower extremities   3. Hammer toes of both feet      Plan:  Patient was evaluated and treated and all questions answered.  Onychomycosis with pain  -Nails palliatively debrided as below. -Educated on self-care  Procedure: Nail Debridement Rationale: Pain Type of Debridement: manual, sharp debridement. Instrumentation: Nail nipper, rotary burr. Number of Nails: 10  # Hammertoe contractures bilaterally associated with neuropathy, neurologic disorder - Causing increasing pain to the  right foot toenails due to pressure with weightbearing and hitting nail on walker -Crest pads applied and dispensed bilaterally in the attempt to reduce the hammertoe contracture somewhat. -Left foot toes are less rigid than the right foot.  The toes of the right foot can still be mildly reduced, this is more painful to the patient due to spastic/rigid contracture of the flexor tendons -May consider flexor tenotomy procedures going forward. - I certify that this diagnosis represents a distinct and separate diagnosis that requires evaluation and treatment separate from other procedures or diagnosis   Return in about 3 months (around 09/08/2023) for Routine Foot Care.         Bronwen Betters, DPM [facility]

## 2023-07-21 ENCOUNTER — Other Ambulatory Visit: Payer: Self-pay | Admitting: Physical Medicine and Rehabilitation

## 2023-09-09 ENCOUNTER — Ambulatory Visit (INDEPENDENT_AMBULATORY_CARE_PROVIDER_SITE_OTHER): Payer: MEDICARE | Admitting: Podiatry

## 2023-09-09 ENCOUNTER — Encounter: Payer: Self-pay | Admitting: Podiatry

## 2023-09-09 DIAGNOSIS — M79675 Pain in left toe(s): Secondary | ICD-10-CM

## 2023-09-09 DIAGNOSIS — M79674 Pain in right toe(s): Secondary | ICD-10-CM

## 2023-09-09 DIAGNOSIS — L600 Ingrowing nail: Secondary | ICD-10-CM | POA: Diagnosis not present

## 2023-09-09 DIAGNOSIS — B351 Tinea unguium: Secondary | ICD-10-CM

## 2023-09-09 DIAGNOSIS — G5793 Unspecified mononeuropathy of bilateral lower limbs: Secondary | ICD-10-CM

## 2023-09-09 MED ORDER — SULFAMETHOXAZOLE-TRIMETHOPRIM 800-160 MG PO TABS: 1.0000 | ORAL_TABLET | Freq: Two times a day (BID) | ORAL | 0 refills | Status: DC

## 2023-09-09 NOTE — Patient Instructions (Signed)
 Place 1/4 cup of epsom salts in a quart of warm tap water.  Submerge your foot or feet in the solution and soak for 20 minutes.  This soak should be done twice a day.  Next, remove your foot or feet from solution, blot dry the affected area. Apply ointment and cover if instructed by your doctor.   IF YOUR SKIN BECOMES IRRITATED WHILE USING THESE INSTRUCTIONS, IT IS OKAY TO SWITCH TO  WHITE VINEGAR AND WATER.  As another alternative soak, you may use antibacterial soap and water.  Monitor for any signs/symptoms of infection. Call the office immediately if any occur or go directly to the emergency room. Call with any questions/concerns.   May apply small amount of Neosporin to the toe for the first 5 to 7 days.  After that time continue soaking and bandaging around the toe to air dry before plain Band-Aid.  Continue this until a dry scab starts to form.

## 2023-09-09 NOTE — Progress Notes (Unsigned)
 Subjective:  Patient ID: Randall Mccormick, male    DOB: 1955-11-27,  MRN: [id number]  Randall Mccormick presents to clinic today for:  Chief Complaint  Patient presents with   [facility]    Not diabetic. Takes ASA. Left 1st toenail seems infected and possibly ingrown. Noticed it last week. Some drainage. Using neosporin and it does appear to be improving.   Patient presenting for above complaint.  Right hallux toenail lateral border has become inflamed and infected over the past week or so.  Patient reports some drainage with this.  The patient also has history of spinal injury and has had history of multiple surgeries resulting in some neurological deficits and neuropathy both feet.  He is predominantly wheelchair-bound with strength deficits to both lower extremities.  The remaining toenails are overgrown and the patient is unable to maintain himself due to mobility issues and due to their thickness.  PCP is Paulina Fusi, MD.  No Known Allergies  Review of Systems: Negative except as noted in the HPI.  Objective:  There were no vitals filed for this visit.  Randall Mccormick is a pleasant 68 y.o. male in NAD. AAO x 3.  Vascular Examination: Capillary refill time is less than 3-seconds to toes bilateral. Palpable pedal pulses b/l LE. Digital hair present b/l.  Pedal edema present b/l. Skin temperature gradient WNL b/l.  Dermatological Examination: There is incurvation of the right hallux lateral nail border.  There is pain on palpation of the affected nail border.  Erythema, edema, sanguinous drainage present to the affected nail border, and effecting the right 1st toe.  The remaining nail plates x 9 on both feet are thickened, elongated, dystrophic and painful on direct dorsal palpation.  Neurological Examination: Protective sensation absent.  Light touch sensation diminished  Musculoskeletal examination:  Minimal active muscle strength.  Spastic hammertoe contractures of digits 1 through  5 noted that are semireducible.      No data to display           Assessment/Plan: 1. Ingrown toenail of right foot with infection   2. Pain due to onychomycosis of toenails of both feet   3. Neuropathy involving both lower extremities     Meds ordered this encounter  Medications   sulfamethoxazole-trimethoprim (BACTRIM DS) 800-160 MG tablet    Sig: Take 1 tablet by mouth 2 (two) times daily for 7 days.    Dispense:  14 tablet    Refill:  0    Discussed patient's condition today.  Recommended removal of the entire toenail due to nonambulatory status and extent of the swelling and redness to the toe.  After obtaining patient consent, the right hallux was anesthetized with a 50:50 mixture of 1% lidocaine plain and 0.5% bupivacaine plain for a total of 3cc's administered.  Upon confirmation of anesthesia, a freer elevator was utilized to free the right hallux nail plate from the nail bed.  The nail plate was then avulsed proximal to the eponychium and removed in toto.  The area was inspected for any remaining spicules.  No frank purulence was appreciated.  A chemical matrixectomy was performed with NaOH and neutralized with acetic acid solution.  Antibiotic ointment and a DSD were applied, followed by a Coban dressing.  Patient tolerated the anesthetic and procedure well and will f/u in 2-3 weeks for recheck.  Patient given post-procedure instructions for daily 15-minute Epsom salt soaks, antibiotic ointment and daily use of Bandaids until toe starts to dry /  form eschar.   Course of oral Bactrim prescribed for cellulitis of the toe.  # Onychomycosis of toenails bilaterally with pain -Remaining nail plates x 9 were debrided in thickness and length using sterile nail nippers without incident.  Mechanical bur used to file down the nails   Return in about 2 weeks (around 09/23/2023) for Nail Check.   Angelia Hazell L. Marchia Bond, AACFAS [facility]     [address] [place], [place] [address]                Office [phone]  Fax [phone]

## 2023-09-10 ENCOUNTER — Encounter (HOSPITAL_COMMUNITY): Payer: Self-pay | Admitting: Emergency Medicine

## 2023-09-10 ENCOUNTER — Emergency Department (HOSPITAL_COMMUNITY): Payer: MEDICARE

## 2023-09-10 ENCOUNTER — Inpatient Hospital Stay (HOSPITAL_COMMUNITY)
Admission: EM | Admit: 2023-09-10 | Discharge: 2023-09-14 | DRG: 871 | Disposition: A | Discharge status: Home Health | Payer: MEDICARE | Attending: Internal Medicine | Admitting: Internal Medicine

## 2023-09-10 ENCOUNTER — Other Ambulatory Visit: Payer: Self-pay

## 2023-09-10 DIAGNOSIS — Z981 Arthrodesis status: Secondary | ICD-10-CM

## 2023-09-10 DIAGNOSIS — G629 Polyneuropathy, unspecified: Secondary | ICD-10-CM | POA: Diagnosis present

## 2023-09-10 DIAGNOSIS — I959 Hypotension, unspecified: Principal | ICD-10-CM

## 2023-09-10 DIAGNOSIS — K5900 Constipation, unspecified: Secondary | ICD-10-CM | POA: Diagnosis present

## 2023-09-10 DIAGNOSIS — Z87891 Personal history of nicotine dependence: Secondary | ICD-10-CM

## 2023-09-10 DIAGNOSIS — A419 Sepsis, unspecified organism: Principal | ICD-10-CM | POA: Diagnosis present

## 2023-09-10 DIAGNOSIS — E785 Hyperlipidemia, unspecified: Secondary | ICD-10-CM | POA: Diagnosis present

## 2023-09-10 DIAGNOSIS — Z811 Family history of alcohol abuse and dependence: Secondary | ICD-10-CM

## 2023-09-10 DIAGNOSIS — Z79899 Other long term (current) drug therapy: Secondary | ICD-10-CM

## 2023-09-10 DIAGNOSIS — Z993 Dependence on wheelchair: Secondary | ICD-10-CM

## 2023-09-10 DIAGNOSIS — E669 Obesity, unspecified: Secondary | ICD-10-CM | POA: Diagnosis present

## 2023-09-10 DIAGNOSIS — Z7982 Long term (current) use of aspirin: Secondary | ICD-10-CM

## 2023-09-10 DIAGNOSIS — J9601 Acute respiratory failure with hypoxia: Secondary | ICD-10-CM | POA: Diagnosis not present

## 2023-09-10 DIAGNOSIS — E872 Acidosis, unspecified: Secondary | ICD-10-CM | POA: Diagnosis present

## 2023-09-10 DIAGNOSIS — R6521 Severe sepsis with septic shock: Secondary | ICD-10-CM | POA: Diagnosis present

## 2023-09-10 DIAGNOSIS — I10 Essential (primary) hypertension: Secondary | ICD-10-CM | POA: Diagnosis present

## 2023-09-10 DIAGNOSIS — N400 Enlarged prostate without lower urinary tract symptoms: Secondary | ICD-10-CM | POA: Diagnosis present

## 2023-09-10 DIAGNOSIS — L03031 Cellulitis of right toe: Secondary | ICD-10-CM | POA: Diagnosis present

## 2023-09-10 DIAGNOSIS — F419 Anxiety disorder, unspecified: Secondary | ICD-10-CM | POA: Diagnosis present

## 2023-09-10 DIAGNOSIS — G8929 Other chronic pain: Secondary | ICD-10-CM | POA: Diagnosis present

## 2023-09-10 DIAGNOSIS — Z6834 Body mass index (BMI) 34.0-34.9, adult: Secondary | ICD-10-CM

## 2023-09-10 DIAGNOSIS — Z791 Long term (current) use of non-steroidal anti-inflammatories (NSAID): Secondary | ICD-10-CM

## 2023-09-10 DIAGNOSIS — Z96651 Presence of right artificial knee joint: Secondary | ICD-10-CM | POA: Diagnosis present

## 2023-09-10 DIAGNOSIS — Z6372 Alcoholism and drug addiction in family: Secondary | ICD-10-CM

## 2023-09-10 DIAGNOSIS — J9811 Atelectasis: Secondary | ICD-10-CM | POA: Diagnosis present

## 2023-09-10 LAB — RESP PANEL BY RT-PCR (RSV, FLU A&B, COVID)  RVPGX2
Influenza A by PCR: NEGATIVE
Influenza B by PCR: NEGATIVE
Resp Syncytial Virus by PCR: NEGATIVE
SARS Coronavirus 2 by RT PCR: NEGATIVE

## 2023-09-10 LAB — URINALYSIS, W/ REFLEX TO CULTURE (INFECTION SUSPECTED)
Bacteria, UA: NONE SEEN
Bilirubin Urine: NEGATIVE
Glucose, UA: NEGATIVE mg/dL
Hgb urine dipstick: NEGATIVE
Ketones, ur: NEGATIVE mg/dL
Leukocytes,Ua: NEGATIVE
Nitrite: NEGATIVE
Protein, ur: NEGATIVE mg/dL
Specific Gravity, Urine: 1.005 (ref 1.005–1.030)
pH: 6 (ref 5.0–8.0)

## 2023-09-10 LAB — COMPREHENSIVE METABOLIC PANEL
ALT: 17 U/L (ref 0–44)
AST: 19 U/L (ref 15–41)
Albumin: 3.2 g/dL — ABNORMAL LOW (ref 3.5–5.0)
Alkaline Phosphatase: 92 U/L (ref 38–126)
Anion gap: 10 (ref 5–15)
BUN: 18 mg/dL (ref 8–23)
CO2: 23 mmol/L (ref 22–32)
Calcium: 8.1 mg/dL — ABNORMAL LOW (ref 8.9–10.3)
Chloride: 101 mmol/L (ref 98–111)
Creatinine, Ser: 1.24 mg/dL (ref 0.61–1.24)
GFR, Estimated: >60 mL/min (ref >60)
Glucose, Bld: 107 mg/dL — ABNORMAL HIGH (ref 70–99)
Potassium: 3.6 mmol/L (ref 3.5–5.1)
Sodium: 134 mmol/L — ABNORMAL LOW (ref 135–145)
Total Bilirubin: 1 mg/dL (ref 0.0–1.2)
Total Protein: 6.5 g/dL (ref 6.5–8.1)

## 2023-09-10 LAB — CBC WITH DIFFERENTIAL/PLATELET
Abs Immature Granulocytes: 0.11 10*3/uL — ABNORMAL HIGH (ref 0.00–0.07)
Basophils Absolute: 0.1 10*3/uL (ref 0.0–0.1)
Basophils Relative: 0 %
Eosinophils Absolute: 0.1 10*3/uL (ref 0.0–0.5)
Eosinophils Relative: 1 %
HCT: 46.7 % (ref 39.0–52.0)
Hemoglobin: 14.7 g/dL (ref 13.0–17.0)
Immature Granulocytes: 1 %
Lymphocytes Relative: 14 %
Lymphs Abs: 1.8 10*3/uL (ref 0.7–4.0)
MCH: 31.6 pg (ref 26.0–34.0)
MCHC: 31.5 g/dL (ref 30.0–36.0)
MCV: 100.4 fL — ABNORMAL HIGH (ref 80.0–100.0)
Monocytes Absolute: 1.2 10*3/uL — ABNORMAL HIGH (ref 0.1–1.0)
Monocytes Relative: 10 %
Neutro Abs: 9.3 10*3/uL — ABNORMAL HIGH (ref 1.7–7.7)
Neutrophils Relative %: 74 %
Platelets: 255 10*3/uL (ref 150–400)
RBC: 4.65 MIL/uL (ref 4.22–5.81)
RDW: 16.1 % — ABNORMAL HIGH (ref 11.5–15.5)
WBC: 12.5 10*3/uL — ABNORMAL HIGH (ref 4.0–10.5)
nRBC: 0 % (ref 0.0–0.2)

## 2023-09-10 LAB — I-STAT CG4 LACTIC ACID, ED: Lactic Acid, Venous: 3.3 mmol/L (ref 0.5–1.9)

## 2023-09-10 MED ORDER — NOREPINEPHRINE 4 MG/250ML-% IV SOLN
INTRAVENOUS | Status: AC
  Administered 2023-09-10: 2 ug/min via INTRAVENOUS
  Filled 2023-09-10: qty 250

## 2023-09-10 MED ORDER — LACTATED RINGERS IV SOLN: INTRAVENOUS | Status: DC

## 2023-09-10 MED ORDER — LACTATED RINGERS IV BOLUS (SEPSIS)
1000.0000 mL | Freq: Once | INTRAVENOUS | Status: AC
  Administered 2023-09-10: 1000 mL via INTRAVENOUS

## 2023-09-10 MED ORDER — LACTATED RINGERS IV BOLUS
1000.0000 mL | Freq: Once | INTRAVENOUS | Status: AC
  Administered 2023-09-10: 1000 mL via INTRAVENOUS

## 2023-09-10 MED ORDER — VANCOMYCIN HCL 2000 MG/400ML IV SOLN
2000.0000 mg | Freq: Once | INTRAVENOUS | Status: AC
  Administered 2023-09-11: 2000 mg via INTRAVENOUS
  Filled 2023-09-10: qty 400

## 2023-09-10 MED ORDER — VANCOMYCIN HCL IN DEXTROSE 1-5 GM/200ML-% IV SOLN: 1000.0000 mg | Freq: Once | INTRAVENOUS | Status: DC

## 2023-09-10 MED ORDER — NOREPINEPHRINE 4 MG/250ML-% IV SOLN: 0.0000 ug/min | INTRAVENOUS | Status: DC

## 2023-09-10 MED ORDER — SODIUM CHLORIDE 0.9 % IV BOLUS
1000.0000 mL | Freq: Once | INTRAVENOUS | Status: AC
  Administered 2023-09-10: 1000 mL via INTRAVENOUS

## 2023-09-10 MED ORDER — METRONIDAZOLE 500 MG/100ML IV SOLN
500.0000 mg | Freq: Once | INTRAVENOUS | Status: AC
  Administered 2023-09-11: 500 mg via INTRAVENOUS
  Filled 2023-09-10: qty 100

## 2023-09-10 MED ORDER — SODIUM CHLORIDE 0.9 % IV SOLN
2.0000 g | Freq: Once | INTRAVENOUS | Status: AC
  Administered 2023-09-10: 2 g via INTRAVENOUS
  Filled 2023-09-10: qty 12.5

## 2023-09-10 NOTE — ED Provider Notes (Signed)
  EMERGENCY DEPARTMENT AT [facility] Provider Note   CSN: [id number] Arrival date & time: 09/10/23  2030     History  Chief Complaint  Patient presents with   Hypotension    Randall Mccormick is a 68 y.o. male.  HPI Patient presents with hypotension.  Low blood pressure.  Some hypoxia.  Mild confusion.  No fevers.  Recently had toe procedure done.  Not on blood thinners.  Is weak at baseline.  Is on chronic pain meds and Xanax but unchanged recently.  No fevers.    Home Medications Prior to Admission medications   Medication Sig Start Date End Date Taking? Authorizing Provider  acetaminophen (TYLENOL) 325 MG tablet Take 975 mg by mouth every 8 (eight) hours as needed for moderate pain.    [provider]  allopurinol (ZYLOPRIM) 300 MG tablet Take 300 mg by mouth daily. 09/16/21   [provider]  ALPRAZolam Prudy Feeler) 0.5 MG tablet Take 0.5 mg by mouth as needed for anxiety.    [provider]  amLODipine (NORVASC) 5 MG tablet Take 5 mg by mouth daily.    [provider]  aspirin 81 MG chewable tablet Chew 1 tablet (81 mg total) by mouth 2 (two) times daily. 03/29/22   Cammy Copa, MD  baclofen 5 MG TABS Take 5 mg by mouth 3 (three) times daily. Patient taking differently: Take 20 mg by mouth in the morning and at bedtime. 04/12/22   Doran Stabler, DO  ezetimibe (ZETIA) 10 MG tablet Take 10 mg by mouth at bedtime. 04/29/20   [provider]  FLUoxetine (PROZAC) 20 MG capsule Take 20 mg by mouth daily.    [provider]  gabapentin (NEURONTIN) 300 MG capsule Take 1 capsule (300 mg total) by mouth 3 (three) times daily. 02/22/22   Lovorn, Aundra Millet, MD  lisinopril (ZESTRIL) 40 MG tablet Take 40 mg by mouth daily.    [provider]  Magnesium Hydroxide (MILK OF MAGNESIA PO) Take 30 mLs by mouth daily.    [provider]  meloxicam (MOBIC) 7.5 MG tablet Take 7.5 mg by mouth daily.    [provider]  metroNIDAZOLE (METROCREAM) 0.75 % cream Apply 1 Application topically daily as needed (rosacea). 07/11/21   [provider]  OLANZapine (ZYPREXA) 10 MG tablet Take 1 tablet (10 mg total) by mouth at bedtime. 04/12/22   Doran Stabler, DO  oxycodone (OXY-IR) 5 MG capsule Take 5 mg by mouth every 4 (four) hours as needed.    [provider]  oxymetazoline (AFRIN) 0.05 % nasal spray Place 1 spray into both nostrils 2 (two) times daily.    [provider]  sulfamethoxazole-trimethoprim (BACTRIM DS) 800-160 MG tablet Take 1 tablet by mouth 2 (two) times daily for 7 days. 09/09/23 09/16/23  Barbaraann Share, DPM  tamsulosin (FLOMAX) 0.4 MG CAPS capsule TAKE 1 CAPSULE EVERY DAY AFTER SUPPER 10/01/22   Lovorn, Aundra Millet, MD  triamcinolone (KENALOG) 0.025 % cream Apply 1 Application topically daily as needed (peeling skin). 07/11/21   [provider]      Allergies    Patient has no known allergies.    Review of Systems   Review of Systems  Physical Exam Updated Vital Signs BP (!) 111/44   Pulse (!) 111   Temp 99.3 F (37.4 C) (Oral)   Resp 18   Ht 5\' 8"  (1.727 m)   Wt 106.6 kg   SpO2 93%   BMI 35.73  kg/m  Physical Exam Vitals and nursing note reviewed.  Cardiovascular:     Rate and Rhythm: Tachycardia present.  Pulmonary:     Breath sounds: No wheezing or rhonchi.  Abdominal:     General: There is distension.  Musculoskeletal:     Comments: Right great toe postsurgical with mild erythema.  Neurological:     Mental Status: He is alert and oriented to person, place, and time.     ED Results / Procedures / Treatments   Labs (all labs ordered are listed, but only abnormal results are displayed) Labs Reviewed  COMPREHENSIVE METABOLIC PANEL - Abnormal; Notable for the following components:      Result Value   Sodium 134 (*)    Glucose, Bld 107 (*)    Calcium 8.1 (*)    Albumin 3.2 (*)    All other components within normal limits  CBC WITH  DIFFERENTIAL/PLATELET - Abnormal; Notable for the following components:   WBC 12.5 (*)    MCV 100.4 (*)    RDW 16.1 (*)    Neutro Abs 9.3 (*)    Monocytes Absolute 1.2 (*)    Abs Immature Granulocytes 0.11 (*)    All other components within normal limits  I-STAT CG4 LACTIC ACID, ED - Abnormal; Notable for the following components:   Lactic Acid, Venous 3.3 (*)    All other components within normal limits  RESP PANEL BY RT-PCR (RSV, FLU A&B, COVID)  [id number]  CULTURE, BLOOD (ROUTINE X 2)  CULTURE, BLOOD (ROUTINE X 2)  URINALYSIS, W/ REFLEX TO CULTURE (INFECTION SUSPECTED)  I-STAT CG4 LACTIC ACID, ED    EKG None  Radiology DG Chest Portable 1 View Result Date: 09/10/2023 CLINICAL DATA:  Hypotension, generalized weakness, hypoxia EXAM: PORTABLE CHEST 1 VIEW COMPARISON:  Radiographs 07/12/2023 FINDINGS: Stable cardiomediastinal silhouette. Aortic atherosclerotic calcification. Low lung volumes accentuate pulmonary vascularity. No focal consolidation, pleural effusion, or pneumothorax. No displaced rib fractures. Thoracic fusion hardware. Remote left rib fractures. IMPRESSION: Low lung volumes without acute cardiopulmonary disease. Electronically Signed   By: Minerva Fester M.D.   On: 09/10/2023 21:10    Procedures Procedures    Medications Ordered in ED Medications  lactated ringers infusion (has no administration in time range)  metroNIDAZOLE (FLAGYL) IVPB 500 mg (has no administration in time range)  vancomycin (VANCOREADY) IVPB 2000 mg/400 mL (has no administration in time range)  norepinephrine (LEVOPHED) 4mg  in (0.016 mg/mL) premix infusion (6 mcg/min Intravenous Rate/Dose Change 09/10/23 2322)  sodium chloride 0.9 % bolus 1,000 mL (0 mLs Intravenous Stopped 09/10/23 2202)  lactated ringers bolus 1,000 mL (0 mLs Intravenous Stopped 09/10/23 2245)  ceFEPIme (MAXIPIME) 2 g in sodium chloride 0.9 % 100 mL IVPB (0 g Intravenous Stopped 09/10/23 2323)  lactated ringers bolus 1,000  mL (0 mLs Intravenous Stopped 09/10/23 2222)  lactated ringers bolus 1,000 mL (1,000 mLs Intravenous New Bag/Given 09/10/23 2310)    ED Course/ Medical Decision Making/ A&P                                 Medical Decision Making Amount and/or Complexity of Data Reviewed Labs: ordered. Radiology: ordered.  Risk Prescription drug management.   Patient with hypotension.  Not clear cause.  Mild hypoxia.  Chest x-ray reassuring.  Differential diagnosis does include infection.  Antibiotics given.  Fluid bolus given up to 30/kg but continued hypotension.  Started on Levophed.  Discussed with ICU will  admit patient.  Reviewed recent podiatry note.  CRITICAL CARE Performed by: Benjiman Core Total critical care time: 30 minutes Critical care time was exclusive of separately billable procedures and treating other patients. Critical care was necessary to treat or prevent imminent or life-threatening deterioration. Critical care was time spent personally by me on the following activities: development of treatment plan with patient and/or surrogate as well as nursing, discussions with consultants, evaluation of patient's response to treatment, examination of patient, obtaining history from patient or surrogate, ordering and performing treatments and interventions, ordering and review of laboratory studies, ordering and review of radiographic studies, pulse oximetry and re-evaluation of patient's condition.         Final Clinical Impression(s) / ED Diagnoses Final diagnoses:  None    Rx / DC Orders ED Discharge Orders     None         Benjiman Core, MD 09/10/23 2345

## 2023-09-10 NOTE — ED Triage Notes (Addendum)
 Pt BIB [facility] from home for hypotension, generalized weakness, EMS v/s upon arrival 88/42, 88-90% RA, HR 84, CBG 112pt given NS en route, pt not oriented to year but answers all other orientation questions correctly

## 2023-09-10 NOTE — Progress Notes (Signed)
 Elink monitoring for the code sepsis protocol.  Kalen.Bachelor Notified ED bedside nurse of need to draw repeat lactic acid.  0230 Notified ICU bedside nurse of need to draw repeat lactic acid.

## 2023-09-11 ENCOUNTER — Inpatient Hospital Stay (HOSPITAL_COMMUNITY): Payer: MEDICARE

## 2023-09-11 DIAGNOSIS — G8929 Other chronic pain: Secondary | ICD-10-CM | POA: Diagnosis present

## 2023-09-11 DIAGNOSIS — Z96651 Presence of right artificial knee joint: Secondary | ICD-10-CM | POA: Diagnosis present

## 2023-09-11 DIAGNOSIS — I10 Essential (primary) hypertension: Secondary | ICD-10-CM | POA: Diagnosis present

## 2023-09-11 DIAGNOSIS — L03031 Cellulitis of right toe: Secondary | ICD-10-CM | POA: Diagnosis present

## 2023-09-11 DIAGNOSIS — E669 Obesity, unspecified: Secondary | ICD-10-CM | POA: Diagnosis present

## 2023-09-11 DIAGNOSIS — A419 Sepsis, unspecified organism: Secondary | ICD-10-CM | POA: Diagnosis present

## 2023-09-11 DIAGNOSIS — N400 Enlarged prostate without lower urinary tract symptoms: Secondary | ICD-10-CM | POA: Diagnosis present

## 2023-09-11 DIAGNOSIS — R6521 Severe sepsis with septic shock: Secondary | ICD-10-CM | POA: Diagnosis present

## 2023-09-11 DIAGNOSIS — R609 Edema, unspecified: Secondary | ICD-10-CM

## 2023-09-11 DIAGNOSIS — J9601 Acute respiratory failure with hypoxia: Secondary | ICD-10-CM | POA: Diagnosis not present

## 2023-09-11 DIAGNOSIS — E785 Hyperlipidemia, unspecified: Secondary | ICD-10-CM | POA: Diagnosis present

## 2023-09-11 DIAGNOSIS — R0902 Hypoxemia: Secondary | ICD-10-CM

## 2023-09-11 DIAGNOSIS — Z993 Dependence on wheelchair: Secondary | ICD-10-CM | POA: Diagnosis not present

## 2023-09-11 DIAGNOSIS — F419 Anxiety disorder, unspecified: Secondary | ICD-10-CM | POA: Diagnosis present

## 2023-09-11 DIAGNOSIS — Z7982 Long term (current) use of aspirin: Secondary | ICD-10-CM | POA: Diagnosis not present

## 2023-09-11 DIAGNOSIS — G629 Polyneuropathy, unspecified: Secondary | ICD-10-CM | POA: Diagnosis present

## 2023-09-11 DIAGNOSIS — I959 Hypotension, unspecified: Secondary | ICD-10-CM

## 2023-09-11 DIAGNOSIS — Z811 Family history of alcohol abuse and dependence: Secondary | ICD-10-CM | POA: Diagnosis not present

## 2023-09-11 DIAGNOSIS — J9811 Atelectasis: Secondary | ICD-10-CM | POA: Diagnosis present

## 2023-09-11 DIAGNOSIS — Z981 Arthrodesis status: Secondary | ICD-10-CM | POA: Diagnosis not present

## 2023-09-11 DIAGNOSIS — K5900 Constipation, unspecified: Secondary | ICD-10-CM | POA: Diagnosis present

## 2023-09-11 DIAGNOSIS — Z87891 Personal history of nicotine dependence: Secondary | ICD-10-CM | POA: Diagnosis not present

## 2023-09-11 DIAGNOSIS — Z6834 Body mass index (BMI) 34.0-34.9, adult: Secondary | ICD-10-CM | POA: Diagnosis not present

## 2023-09-11 DIAGNOSIS — Z79899 Other long term (current) drug therapy: Secondary | ICD-10-CM | POA: Diagnosis not present

## 2023-09-11 DIAGNOSIS — Z6372 Alcoholism and drug addiction in family: Secondary | ICD-10-CM | POA: Diagnosis not present

## 2023-09-11 DIAGNOSIS — E872 Acidosis, unspecified: Secondary | ICD-10-CM | POA: Diagnosis present

## 2023-09-11 DIAGNOSIS — Z791 Long term (current) use of non-steroidal anti-inflammatories (NSAID): Secondary | ICD-10-CM | POA: Diagnosis not present

## 2023-09-11 DIAGNOSIS — E8729 Other acidosis: Secondary | ICD-10-CM

## 2023-09-11 HISTORY — DX: Sepsis, unspecified organism: A41.9

## 2023-09-11 LAB — CBC
HCT: 40.8 % (ref 39.0–52.0)
Hemoglobin: 13.2 g/dL (ref 13.0–17.0)
MCH: 31.7 pg (ref 26.0–34.0)
MCHC: 32.4 g/dL (ref 30.0–36.0)
MCV: 97.8 fL (ref 80.0–100.0)
Platelets: 223 10*3/uL (ref 150–400)
RBC: 4.17 MIL/uL — ABNORMAL LOW (ref 4.22–5.81)
RDW: 15.9 % — ABNORMAL HIGH (ref 11.5–15.5)
WBC: 10.9 10*3/uL — ABNORMAL HIGH (ref 4.0–10.5)
nRBC: 0 % (ref 0.0–0.2)

## 2023-09-11 LAB — HEMOGLOBIN A1C
Hgb A1c MFr Bld: 5.3 % (ref 4.8–5.6)
Mean Plasma Glucose: 105.41 mg/dL

## 2023-09-11 LAB — BASIC METABOLIC PANEL
Anion gap: 8 (ref 5–15)
BUN: 12 mg/dL (ref 8–23)
CO2: 25 mmol/L (ref 22–32)
Calcium: 8.5 mg/dL — ABNORMAL LOW (ref 8.9–10.3)
Chloride: 106 mmol/L (ref 98–111)
Creatinine, Ser: 0.87 mg/dL (ref 0.61–1.24)
GFR, Estimated: >60 mL/min (ref >60)
Glucose, Bld: 120 mg/dL — ABNORMAL HIGH (ref 70–99)
Potassium: 3.5 mmol/L (ref 3.5–5.1)
Sodium: 139 mmol/L (ref 135–145)

## 2023-09-11 LAB — GLUCOSE, CAPILLARY
Glucose-Capillary: 108 mg/dL — ABNORMAL HIGH (ref 70–99)
Glucose-Capillary: 92 mg/dL (ref 70–99)
Glucose-Capillary: 119 mg/dL — ABNORMAL HIGH (ref 70–99)
Glucose-Capillary: 123 mg/dL — ABNORMAL HIGH (ref 70–99)

## 2023-09-11 LAB — CBG MONITORING, ED: Glucose-Capillary: 121 mg/dL — ABNORMAL HIGH (ref 70–99)

## 2023-09-11 LAB — HIV ANTIBODY (ROUTINE TESTING W REFLEX): HIV Screen 4th Generation wRfx: NONREACTIVE

## 2023-09-11 LAB — MRSA NEXT GEN BY PCR, NASAL: MRSA by PCR Next Gen: NOT DETECTED

## 2023-09-11 LAB — LACTIC ACID, PLASMA: Lactic Acid, Venous: 0.8 mmol/L (ref 0.5–1.9)

## 2023-09-11 LAB — MAGNESIUM: Magnesium: 1.9 mg/dL (ref 1.7–2.4)

## 2023-09-11 LAB — PHOSPHORUS: Phosphorus: 2.9 mg/dL (ref 2.5–4.6)

## 2023-09-11 MED ORDER — EZETIMIBE 10 MG PO TABS
10.0000 mg | ORAL_TABLET | Freq: Every day | ORAL | Status: DC
  Administered 2023-09-11 – 2023-09-14 (×4): 10 mg via ORAL
  Filled 2023-09-11 (×4): qty 1

## 2023-09-11 MED ORDER — HEPARIN SODIUM (PORCINE) 5000 UNIT/ML IJ SOLN
5000.0000 [IU] | Freq: Three times a day (TID) | INTRAMUSCULAR | Status: DC
  Administered 2023-09-11 – 2023-09-14 (×11): 5000 [IU] via SUBCUTANEOUS
  Filled 2023-09-11 (×11): qty 1

## 2023-09-11 MED ORDER — SIMETHICONE 80 MG PO CHEW
80.0000 mg | CHEWABLE_TABLET | Freq: Four times a day (QID) | ORAL | Status: DC
  Administered 2023-09-11 – 2023-09-14 (×13): 80 mg via ORAL
  Filled 2023-09-11 (×13): qty 1

## 2023-09-11 MED ORDER — POLYETHYLENE GLYCOL 3350 17 G PO PACK
17.0000 g | PACK | Freq: Every day | ORAL | Status: DC
  Administered 2023-09-11: 17 g via ORAL
  Filled 2023-09-11 (×3): qty 1

## 2023-09-11 MED ORDER — IOHEXOL 350 MG/ML SOLN
75.0000 mL | Freq: Once | INTRAVENOUS | Status: AC | PRN
  Administered 2023-09-11: 75 mL via INTRAVENOUS

## 2023-09-11 MED ORDER — OXYCODONE HCL 5 MG PO TABS
10.0000 mg | ORAL_TABLET | Freq: Three times a day (TID) | ORAL | Status: DC | PRN
  Administered 2023-09-11 – 2023-09-14 (×7): 10 mg via ORAL
  Filled 2023-09-11 (×8): qty 2

## 2023-09-11 MED ORDER — LACTATED RINGERS IV SOLN: INTRAVENOUS | Status: AC

## 2023-09-11 MED ORDER — POTASSIUM CHLORIDE 10 MEQ/100ML IV SOLN
10.0000 meq | INTRAVENOUS | Status: AC
  Administered 2023-09-11 (×4): 10 meq via INTRAVENOUS
  Filled 2023-09-11 (×4): qty 100

## 2023-09-11 MED ORDER — BACLOFEN 10 MG PO TABS
20.0000 mg | ORAL_TABLET | Freq: Two times a day (BID) | ORAL | Status: DC
  Administered 2023-09-11 – 2023-09-14 (×7): 20 mg via ORAL
  Filled 2023-09-11 (×7): qty 2

## 2023-09-11 MED ORDER — TAMSULOSIN HCL 0.4 MG PO CAPS
0.4000 mg | ORAL_CAPSULE | Freq: Every day | ORAL | Status: DC
  Administered 2023-09-11 – 2023-09-14 (×4): 0.4 mg via ORAL
  Filled 2023-09-11 (×4): qty 1

## 2023-09-11 MED ORDER — ASPIRIN 81 MG PO CHEW
81.0000 mg | CHEWABLE_TABLET | Freq: Every day | ORAL | Status: DC
  Administered 2023-09-11 – 2023-09-14 (×4): 81 mg via ORAL
  Filled 2023-09-11 (×4): qty 1

## 2023-09-11 MED ORDER — CHLORHEXIDINE GLUCONATE CLOTH 2 % EX PADS
6.0000 | MEDICATED_PAD | Freq: Every day | CUTANEOUS | Status: DC
  Administered 2023-09-11 – 2023-09-14 (×4): 6 via TOPICAL

## 2023-09-11 MED ORDER — INSULIN ASPART 100 UNIT/ML IJ SOLN
0.0000 [IU] | INTRAMUSCULAR | Status: DC
  Administered 2023-09-11 – 2023-09-12 (×5): 1 [IU] via SUBCUTANEOUS

## 2023-09-11 MED ORDER — MAGNESIUM SULFATE 2 GM/50ML IV SOLN
2.0000 g | Freq: Once | INTRAVENOUS | Status: AC
  Administered 2023-09-11: 2 g via INTRAVENOUS
  Filled 2023-09-11: qty 50

## 2023-09-11 MED ORDER — ONDANSETRON HCL 4 MG/2ML IJ SOLN: 4.0000 mg | Freq: Four times a day (QID) | INTRAMUSCULAR | Status: DC | PRN

## 2023-09-11 MED ORDER — SODIUM CHLORIDE 0.9 % IV SOLN
2.0000 g | Freq: Three times a day (TID) | INTRAVENOUS | Status: DC
  Administered 2023-09-11 – 2023-09-14 (×11): 2 g via INTRAVENOUS
  Filled 2023-09-11 (×11): qty 12.5

## 2023-09-11 MED ORDER — VANCOMYCIN HCL 1250 MG/250ML IV SOLN
1250.0000 mg | INTRAVENOUS | Status: DC
  Administered 2023-09-12 – 2023-09-13 (×2): 1250 mg via INTRAVENOUS
  Filled 2023-09-11 (×2): qty 250

## 2023-09-11 NOTE — Progress Notes (Signed)
 NAME:  Randall Mccormick, MRN:  [id number], DOB:  [birth year], LOS: 0 ADMISSION DATE:  09/10/2023, CONSULTATION DATE:  09/11/2023 REFERRING MD:  Benjiman Core, MD, CHIEF COMPLAINT:  Toe infection  History of Present Illness:    68 year old man who has very limited gait and frequent bowel impaction.   He has been mostly wheelchair bound bound after having thoracic spine surgery ([facility]) in 2022.  He had T10-pelvis fusion 11/23/2019 (Dr. 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  Patient BIB [facility] from home for hypotension, generalized weakness, EMS v/s upon arrival 88/42, 88-90% RA, HR 84, CBG 112.  Pt given NS en route, pt not oriented to year but answers all other orientation questions correctly.  The patient saw his Podiatrist on 3/18 and was seen for cellulitis of his right big toe and had the fingernail removed.  Wife says yesterday (3/19) he was very lethargic and tired and sometimes speaking in non-sense words.  It took him 45 min to eat a slice of pizza as he kept dozing off.  He made urine in the morning and then did not pee all day.  Patient has peripheral neuropathy and is mostly bed/chair bound and uses a walker to get around.       Pertinent  Medical History  T10-pelvis fusion 11/23/2019 (Dr. 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 Neuropathy Significant Hospital Events: Including procedures, antibiotic start and stop dates in addition to other pertinent events   3/19: BIBEMS for weakness 3/20: Admitted to ICU for sepsis/vasopressors, off pressors later in the day and plan to transfer to med-surg  Interim History / Subjective:  Pt is off pressors and is hemodynamically stable and lactate has cleared, sats fine on 4L [place], plan to  transfer out of ICU  Objective   Blood pressure (!) 123/98, pulse 76, temperature 97.6 F (36.4 C), temperature source Axillary, resp. rate 18, height 5\' 8"  (1.727 m), weight 104.8 kg, SpO2 99%.        Intake/Output Summary (Last 24 hours) at 09/11/2023 0949 Last data filed at 09/11/2023 0900 Gross per 24 hour  Intake 2043.24 ml  Output 2745 ml  Net -701.76 ml   Filed Weights   09/10/23 2039 09/11/23 0159  Weight: 106.6 kg 104.8 kg   General:  well nourished M, resting in bed in NAD HEENT: MM pink/moist Neuro: alert and oriented, moving all extmities CV: s1s2 rrr, no m/r/g PULM:  clear bilaterally on venti mask, no accessory muscle use or tachypnea GI: soft, bsx4 active  Extremities: warm/dry, pedal edema R>L, R great to with nail removal and mild erythema, no streaking or drainage  Skin: no rashes or lesions    Labs Lactic 0.8 WBC 10 Gluc 120   Imaging  CTA chest-no PE or infiltrate, bilateral atelectasis CT abd/pelvis-stool and colonic gas LE dopplers are pending  Resolved Hospital Problem list   N/a  Assessment & Plan:   Septic shock, most likely secondary to cellulitis right great toe Hypotension, Lactic Acidosis Sepsis physiology improving overnight, off norepi, lactic has cleared, CT abd/pelvis without source  -follow blood cultures, UA unremarkable  -continue vanc/cefepime for now -follow LE doppler results  -stable for transfer out of ICU, will need to follow up with his podiatrist outpatient  Acute hypoxic respiratory failure  No PE or infiltrate on CTA chest  Plan for broad spectrum antibiotics IV Hydration Vasopressors, currently on levophed 55mcg/min, trying to wean off Monitor Is/Os Repeat serum Lactate and follow trends Cultures-Blood/Urine Hypoxic-Questionable Pneumonia vs Atelectasis, continue O2 Will get CTA for Pulmonary emboli and [place] LE dopplers for DVT, DVT prophylaxis otherwise  Chronic pain and Constipation Anxiety Hx multiple  spinal surgeries -continue oxy 10mg  tid prn, resume home Baclofen 20mg  bid -hold home Mobic, zyprexa, Xanax, prozac, mobic, neurontin for now   HTN, HL -continue home Asa and Zetia -hold Lisinopril and Norvasc as norepi has just been discontinued  Best Practice (right click and "Reselect all SmartList Selections" daily)   Diet/type: Regular consistency (see orders) and NPO w/ oral meds DVT prophylaxis LMWH Pressure ulcer(s): N/A GI prophylaxis: N/A Lines: N/A Foley:  Yes, and it is still needed Code Status:  full code   Labs   CBC: Recent Labs  Lab 09/10/23 2052 09/11/23 0344  WBC 12.5* 10.9*  NEUTROABS 9.3*  --   HGB 14.7 13.2  HCT 46.7 40.8  MCV 100.4* 97.8  PLT 255 223    Basic Metabolic Panel: Recent Labs  Lab 09/10/23 2052 09/11/23 0344  NA 134* 139  K 3.6 3.5  CL 101 106  CO2 23 25  GLUCOSE 107* 120*  BUN 18 12  CREATININE 1.24 0.87  CALCIUM 8.1* 8.5*  MG  --  1.9  PHOS  --  2.9   GFR: Estimated Creatinine Clearance: 95.4 mL/min (by C-G formula based on SCr of 0.87 mg/dL). Recent Labs  Lab 09/10/23 2052 09/10/23 2132 09/11/23 0344  WBC 12.5*  --  10.9*  LATICACIDVEN  --  3.3* 0.8    Liver Function Tests: Recent Labs  Lab 09/10/23 2052  AST 19  ALT 17  ALKPHOS 92  BILITOT 1.0  PROT 6.5  ALBUMIN 3.2*   No results for input(s): "LIPASE", "AMYLASE" in the last 168 hours. No results for input(s): "AMMONIA" in the last 168 hours.  ABG    Component Value Date/Time   PHART 7.463 (H) 04/07/2022 0905   PCO2ART 23.7 (L) 04/07/2022 0905   PO2ART 88 04/07/2022 0905   HCO3 17.0 (L) 04/07/2022 0905   TCO2 18 (L) 04/07/2022 0905   ACIDBASEDEF 6.0 (H) 04/07/2022 0905   O2SAT 97 04/07/2022 0905     Coagulation Profile: No results for input(s): "INR", "PROTIME" in the last 168 hours.  Cardiac Enzymes: No results for input(s): "CKTOTAL", "CKMB", "CKMBINDEX", "TROPONINI" in the last 168 hours.  HbA1C: Hgb A1c MFr Bld  Date/Time Value Ref  Range Status  09/11/2023 03:44 AM 5.3 4.8 - 5.6 % Final    Comment:    (NOTE) Pre diabetes:          5.7%-6.4%  Diabetes:              >6.4%  Glycemic control for   <7.0% adults with diabetes   04/06/2022 04:58 PM 5.3 4.8 - 5.6 % Final    Comment:    (NOTE) Pre diabetes:          5.7%-6.4%  Diabetes:              >6.4%  Glycemic control for   <7.0% adults with diabetes     CBG: Recent Labs  Lab 09/11/23 0038 09/11/23 0807  GLUCAP 121* 108*    Review of Systems:   No pain, lethargic/sleep/tired  Past Medical History:  He,  has a past  medical history of Anxiety, Cobalamin deficiency, Degeneration of lumbar or lumbosacral intervertebral disc, Depression, Gouty arthropathy, unspecified, Hemorrhoids, Hyperlipidemia, Hypertension, Multiple fractures of ribs of left side (04/20/2013), and Vitamin D deficiency.   Surgical History:   Past Surgical History:  Procedure Laterality Date   APPENDECTOMY     BACK SURGERY     x 6   CHOLECYSTECTOMY  06/24/2008   [facility]   ESOPHAGOGASTRODUODENOSCOPY N/A 04/06/2022   Procedure: ESOPHAGOGASTRODUODENOSCOPY (EGD);  Surgeon: Sherrilyn Rist, MD;  Location[facility] ENDOSCOPY;  Service: Gastroenterology;  Laterality: N/A;   TONSILLECTOMY     TOTAL KNEE ARTHROPLASTY Right 03/28/2022   Procedure: RIGHT TOTAL KNEE ARTHROPLASTY;  Surgeon: Cammy Copa, MD;  Location: [facility] OR;  Service: Orthopedics;  Laterality: Right;     Social History:   reports that he has never smoked. He quit smokeless tobacco use about 3 years ago.  His smokeless tobacco use included chew. He reports current alcohol use of about 1.0 standard drink of alcohol per week. He reports that he does not currently use drugs.   Family History:  His family history includes Alcoholism in his father; Cancer in his mother and son; Heart disease in his father.   Allergies No Known Allergies   Home Medications  Prior to Admission medications   Medication Sig Start  Date End Date Taking? Authorizing Provider  acetaminophen (TYLENOL) 325 MG tablet Take 975 mg by mouth every 8 (eight) hours as needed for moderate pain.    [provider]  allopurinol (ZYLOPRIM) 300 MG tablet Take 300 mg by mouth daily. 09/16/21   [provider]  ALPRAZolam Prudy Feeler) 0.5 MG tablet Take 0.5 mg by mouth as needed for anxiety.    [provider]  amLODipine (NORVASC) 5 MG tablet Take 5 mg by mouth daily.    [provider]  aspirin 81 MG chewable tablet Chew 1 tablet (81 mg total) by mouth 2 (two) times daily. 03/29/22   Cammy Copa, MD  baclofen 5 MG TABS Take 5 mg by mouth 3 (three) times daily. Patient taking differently: Take 20 mg by mouth in the morning and at bedtime. 04/12/22   Doran Stabler, DO  ezetimibe (ZETIA) 10 MG tablet Take 10 mg by mouth at bedtime. 04/29/20   [provider]  FLUoxetine (PROZAC) 20 MG capsule Take 20 mg by mouth daily.    [provider]  gabapentin (NEURONTIN) 300 MG capsule Take 1 capsule (300 mg total) by mouth 3 (three) times daily. 02/22/22   Lovorn, Aundra Millet, MD  lisinopril (ZESTRIL) 40 MG tablet Take 40 mg by mouth daily.    [provider]  Magnesium Hydroxide (MILK OF MAGNESIA PO) Take 30 mLs by mouth daily.    [provider]  meloxicam (MOBIC) 7.5 MG tablet Take 7.5 mg by mouth daily.    [provider]  metroNIDAZOLE (METROCREAM) 0.75 % cream Apply 1 Application topically daily as needed (rosacea). 07/11/21   [provider]  OLANZapine (ZYPREXA) 10 MG tablet Take 1 tablet (10 mg total) by mouth at bedtime. 04/12/22   Doran Stabler, DO  oxycodone (OXY-IR) 5 MG capsule Take 5 mg by mouth every 4 (four) hours as needed.    [provider]  oxymetazoline (AFRIN) 0.05 % nasal spray Place 1 spray into both nostrils 2 (two) times daily.    [provider]  sulfamethoxazole-trimethoprim (BACTRIM DS) 800-160 MG tablet Take 1 tablet by mouth 2  (two) times daily for 7  days. 09/09/23 09/16/23  Barbaraann Share, DPM  tamsulosin (FLOMAX) 0.4 MG CAPS capsule TAKE 1 CAPSULE EVERY DAY AFTER SUPPER 10/01/22   Lovorn, Aundra Millet, MD  triamcinolone (KENALOG) 0.025 % cream Apply 1 Application topically daily as needed (peeling skin). 07/11/21   [provider]     Critical care time:       Darcella Gasman Tavion Senkbeil, PA-C [facility] See Amion for pager If no response to pager , please call [phone] until 7pm After 7:00 pm call Elink  [phone]

## 2023-09-11 NOTE — ED Notes (Signed)
 Report called via phone to [facility] RN in ICU.

## 2023-09-11 NOTE — Plan of Care (Signed)
  Problem: Coping: Goal: Ability to adjust to condition or change in health will improve Outcome: Progressing   Problem: Fluid Volume: Goal: Ability to maintain a balanced intake and output will improve Outcome: Progressing   Problem: Health Behavior/Discharge Planning: Goal: Ability to manage health-related needs will improve Outcome: Progressing   Problem: Metabolic: Goal: Ability to maintain appropriate glucose levels will improve Outcome: Progressing   Problem: Nutritional: Goal: Maintenance of adequate nutrition will improve Outcome: Progressing   Problem: Skin Integrity: Goal: Risk for impaired skin integrity will decrease Outcome: Progressing   Problem: Education: Goal: Knowledge of General Education information will improve Description: Including pain rating scale, medication(s)/side effects and non-pharmacologic comfort measures Outcome: Progressing

## 2023-09-11 NOTE — ED Notes (Signed)
 Pharmacy Antibiotic Note  Randall Mccormick is a 68 y.o. male admitted on 09/10/2023 with sepsis.  Recent outpatient antibiotic with bactrim for cellulitis of toe from podiatry (7 days supply prescribed on 3/18). Pharmacy has been consulted for vancomycin dosing. Cefepime and flagyl per MD.   Plan: Vancomycin 2g load in ED > vancomycin 1250mg  q24h (eAUC 475, Scr 1.24, Vd 0.5) F/u renal function, infectious work up and length of therapy Vancomycin levels as needed  Height: 5\' 8"  (172.7 cm) Weight: 106.6 kg (235 lb) IBW/kg (Calculated) : 68.4  Temp (24hrs), Avg:98.9 F (37.2 C), Min:98.6 F (37 C), Max:99.3 F (37.4 C)  Recent Labs  Lab 09/10/23 2052 09/10/23 2132  WBC 12.5*  --   CREATININE 1.24  --   LATICACIDVEN  --  3.3*    Estimated Creatinine Clearance: 67.5 mL/min (by C-G formula based on SCr of 1.24 mg/dL).    No Known Allergies  Antimicrobials this admission: Cefepime 3/19 > Vancomycin 3/20 > Flagyl 3/20>   Microbiology results: 3/19 Bcx: 3/19 resp panel: neg 3/20 MRSA pcr:  Thank you for allowing pharmacy to be a part of this patient's care.  Marja Kays 09/11/2023 12:31 AM

## 2023-09-11 NOTE — Progress Notes (Signed)
 eLink Physician-Brief Progress Note Patient Name: Randall Mccormick DOB: [birth year] MRN: [id number]   Date of Service  09/11/2023  HPI/Events of Note  68 year old with a history of T10 fusion mitigate brought in by EMS for hypotension and generalized weakness found to be hypoxic and in shock thought to be secondary to his right lower extremity cellulitis.  On examination, vital signs are within normal limits.  Saturating 96% on 4 L, currently on norepinephrine infusion at 5 mcg.  Status post broad-spectrum antibiotics.  Metabolic panel consistent with mild electrolyte disturbances, leukocytosis, and CT imaging with no acute actionable disease  eICU Interventions  Maintain broad-spectrum antibiotics, crystalloid infusion.  Cultures pending.  Maintain norepinephrine infusion, wean as tolerated.  MAP goal greater than 65  DVT prophylaxis with heparin GI prophylaxis not indicated     Intervention Category Evaluation Type: New Patient Evaluation  Chanya Chrisley 09/11/2023, 3:58 AM

## 2023-09-11 NOTE — H&P (Signed)
 NAME:  Randall Mccormick, MRN:  [id number], DOB:  [birth year], LOS: 0 ADMISSION DATE:  09/10/2023, CONSULTATION DATE:  09/11/2023 REFERRING MD:  Benjiman Core, MD, CHIEF COMPLAINT:  Toe infection  History of Present Illness:      68 year old man who has very limited gait and frequent bowel impaction.   He has been mostly wheelchair bound bound after having thoracic spine surgery ([facility]) in 2022.  He had T10-pelvis fusion 11/23/2019 (Dr. 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  Patient BIB [facility] from home for hypotension, generalized weakness, EMS v/s upon arrival 88/42, 88-90% RA, HR 84, CBG 112.  Pt given NS en route, pt not oriented to year but answers all other orientation questions correctly.  The patient saw his Podiatrist on 3/18 and was seen for cellulitis of his right big toe and had the fingernail removed.  Wife says yesterday (3/19) he was very lethargic and tired and sometimes speaking in non-sense words.  It took him 45 min to eat a slice of pizza as he kept dozing off.  He made urine in the morning and then did not pee all day.  Patient has peripheral neuropathy and is mostly bed/chair bound and uses a walker to get around.                   Pertinent  Medical History  T10-pelvis fusion 11/23/2019 (Dr. 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 Neuropathy Significant Hospital Events: Including procedures, antibiotic start and stop dates in addition to other pertinent events   3/19: BIBEMS for weakness 3/20: Admitted to ICU for sepsis/vasopressors  Interim History / Subjective:  N/a  Objective   Blood pressure (!) 140/114, pulse 96, temperature 98.7 F (37.1 C), temperature source Oral, resp. rate 16, height 5\' 8"  (1.727  m), weight 106.6 kg, SpO2 95%.       No intake or output data in the 24 hours ending 09/11/23 0025 Filed Weights   09/10/23 2039  Weight: 106.6 kg    Examination: General: lethargic but arouaseble NAD HENT: PERRLA, no icterus, injected scalera Lungs: CTA no wheezes no rales Cardiovascular: reg s1s2 no murmurs no rubs Abdomen: soft obese NT ND BS pos no guarding Extremities: pos LE edema, right big toe no bandage, open but no drainage Neuro: AAOx 3, CN II to XII grossly intact   Resolved Hospital Problem list   N/a  Assessment & Plan:  Sepsis Cellulitis right big toe Hypotension Hypoxemia Lactic Acidosis  Plan for broad spectrum antibiotics IV Hydration Vasopressors, currently on levophed 3mcg/min, trying to wean off Monitor Is/Os Repeat serum Lactate and follow trends Cultures-Blood/Urine Hypoxic-Questionable Pneumonia vs Atelectasis, continue O2 Will get CTA for Pulmonary emboli and [place] LE dopplers for DVT, DVT prophylaxis otherwise  Best Practice (right click and "Reselect all SmartList Selections" daily)   Diet/type: NPO w/ oral meds DVT prophylaxis LMWH Pressure ulcer(s): N/A GI prophylaxis: N/A Lines: N/A Foley:  Yes, and it is still needed Code Status:  full code   Labs   CBC: Recent Labs  Lab 09/10/23 2052  WBC 12.5*  NEUTROABS 9.3*  HGB 14.7  HCT 46.7  MCV 100.4*  PLT 255    Basic Metabolic Panel: Recent Labs  Lab 09/10/23 2052  NA 134*  K  3.6  CL 101  CO2 23  GLUCOSE 107*  BUN 18  CREATININE 1.24  CALCIUM 8.1*   GFR: Estimated Creatinine Clearance: 67.5 mL/min (by C-G formula based on SCr of 1.24 mg/dL). Recent Labs  Lab 09/10/23 2052 09/10/23 2132  WBC 12.5*  --   LATICACIDVEN  --  3.3*    Liver Function Tests: Recent Labs  Lab 09/10/23 2052  AST 19  ALT 17  ALKPHOS 92  BILITOT 1.0  PROT 6.5  ALBUMIN 3.2*   No results for input(s): "LIPASE", "AMYLASE" in the last 168 hours. No results for input(s): "AMMONIA" in  the last 168 hours.  ABG    Component Value Date/Time   PHART 7.463 (H) 04/07/2022 0905   PCO2ART 23.7 (L) 04/07/2022 0905   PO2ART 88 04/07/2022 0905   HCO3 17.0 (L) 04/07/2022 0905   TCO2 18 (L) 04/07/2022 0905   ACIDBASEDEF 6.0 (H) 04/07/2022 0905   O2SAT 97 04/07/2022 0905     Coagulation Profile: No results for input(s): "INR", "PROTIME" in the last 168 hours.  Cardiac Enzymes: No results for input(s): "CKTOTAL", "CKMB", "CKMBINDEX", "TROPONINI" in the last 168 hours.  HbA1C: Hgb A1c MFr Bld  Date/Time Value Ref Range Status  04/06/2022 04:58 PM 5.3 4.8 - 5.6 % Final    Comment:    (NOTE) Pre diabetes:          5.7%-6.4%  Diabetes:              >6.4%  Glycemic control for   <7.0% adults with diabetes     CBG: No results for input(s): "GLUCAP" in the last 168 hours.  Review of Systems:   No pain, lethargic/sleep/tired  Past Medical History:  He,  has a past medical history of Anxiety, Cobalamin deficiency, Degeneration of lumbar or lumbosacral intervertebral disc, Depression, Gouty arthropathy, unspecified, Hemorrhoids, Hyperlipidemia, Hypertension, Multiple fractures of ribs of left side (04/20/2013), and Vitamin D deficiency.   Surgical History:   Past Surgical History:  Procedure Laterality Date   APPENDECTOMY     BACK SURGERY     x 6   CHOLECYSTECTOMY  06/24/2008   [facility]   ESOPHAGOGASTRODUODENOSCOPY N/A 04/06/2022   Procedure: ESOPHAGOGASTRODUODENOSCOPY (EGD);  Surgeon: Sherrilyn Rist, MD;  Location: [facility];  Service: Gastroenterology;  Laterality: N/A;   TONSILLECTOMY     TOTAL KNEE ARTHROPLASTY Right 03/28/2022   Procedure: RIGHT TOTAL KNEE ARTHROPLASTY;  Surgeon: Cammy Copa, MD;  Location: [facility];  Service: Orthopedics;  Laterality: Right;     Social History:   reports that he has never smoked. He quit smokeless tobacco use about 3 years ago.  His smokeless tobacco use included chew. He reports current alcohol use of  about 1.0 standard drink of alcohol per week. He reports that he does not currently use drugs.   Family History:  His family history includes Alcoholism in his father; Cancer in his mother and son; Heart disease in his father.   Allergies No Known Allergies   Home Medications  Prior to Admission medications   Medication Sig Start Date End Date Taking? Authorizing Provider  acetaminophen (TYLENOL) 325 MG tablet Take 975 mg by mouth every 8 (eight) hours as needed for moderate pain.    [provider]  allopurinol (ZYLOPRIM) 300 MG tablet Take 300 mg by mouth daily. 09/16/21   [provider]  ALPRAZolam Prudy Feeler) 0.5 MG tablet Take 0.5 mg by mouth as needed for anxiety.    [provider]  amLODipine (NORVASC) 5 MG tablet Take 5 mg by mouth daily.    [provider]  aspirin 81 MG chewable tablet Chew 1 tablet (81 mg total) by mouth 2 (two) times daily. 03/29/22   Cammy Copa, MD  baclofen 5 MG TABS Take 5 mg by mouth 3 (three) times daily. Patient taking differently: Take 20 mg by mouth in the morning and at bedtime. 04/12/22   Doran Stabler, DO  ezetimibe (ZETIA) 10 MG tablet Take 10 mg by mouth at bedtime. 04/29/20   [provider]  FLUoxetine (PROZAC) 20 MG capsule Take 20 mg by mouth daily.    [provider]  gabapentin (NEURONTIN) 300 MG capsule Take 1 capsule (300 mg total) by mouth 3 (three) times daily. 02/22/22   Lovorn, Aundra Millet, MD  lisinopril (ZESTRIL) 40 MG tablet Take 40 mg by mouth daily.    [provider]  Magnesium Hydroxide (MILK OF MAGNESIA PO) Take 30 mLs by mouth daily.    [provider]  meloxicam (MOBIC) 7.5 MG tablet Take 7.5 mg by mouth daily.    [provider]  metroNIDAZOLE (METROCREAM) 0.75 % cream Apply 1 Application topically daily as needed (rosacea). 07/11/21   [provider]  OLANZapine (ZYPREXA) 10 MG tablet Take 1 tablet (10 mg total) by mouth at bedtime. 04/12/22    Doran Stabler, DO  oxycodone (OXY-IR) 5 MG capsule Take 5 mg by mouth every 4 (four) hours as needed.    [provider]  oxymetazoline (AFRIN) 0.05 % nasal spray Place 1 spray into both nostrils 2 (two) times daily.    [provider]  sulfamethoxazole-trimethoprim (BACTRIM DS) 800-160 MG tablet Take 1 tablet by mouth 2 (two) times daily for 7 days. 09/09/23 09/16/23  Barbaraann Share, DPM  tamsulosin (FLOMAX) 0.4 MG CAPS capsule TAKE 1 CAPSULE EVERY DAY AFTER SUPPER 10/01/22   Lovorn, Aundra Millet, MD  triamcinolone (KENALOG) 0.025 % cream Apply 1 Application topically daily as needed (peeling skin). 07/11/21   [provider]     Critical care time: 97   The patient is critically ill with multiple organ system failure and requires high complexity decision making for assessment and support, frequent evaluation and titration of therapies, advanced monitoring, review of radiographic studies and interpretation of complex data.   Critical Care Time devoted to patient care services, exclusive of separately billable procedures, described in this note is 34 minutes.   Rozann Lesches, MD [facility] See Amion for pager  If no response to pager , please call [phone] until 7pm After 7:00 pm call Elink  [phone] 09/11/2023, 12:25 AM

## 2023-09-11 NOTE — TOC Initial Note (Signed)
 Transition of Care [facility]) - Initial/Assessment Note    Patient Details  Name: Randall Mccormick MRN: [id number] Date of Birth: 1955-11-15  Transition of Care [facility]) CM/SW Contact:    Randall Cousin, RN Phone Number: [phone] 09/11/2023, 5:03 PM   Clinical Narrative:                  TOC CM spoke to pt at bedside. Gave permission to contact wife, Randall Mccormick. Attempted call to wife. Unable to leave voicemail.    Will continue to follow for dc needs.  Expected Discharge Plan: Home w Home Health Services Barriers to Discharge: Continued Medical Work up   Patient Goals and CMS Choice Patient states their goals for this hospitalization and ongoing recovery are:: wants to recover          Expected Discharge Plan and Services   Discharge Planning Services: CM Consult   Living arrangements for the past 2 months: Single Family Home                                      Prior Living Arrangements/Services Living arrangements for the past 2 months: Single Family Home Lives with:: Spouse Patient language and need for interpreter reviewed:: Yes Do you feel safe going back to the place where you live?: Yes      Need for Family Participation in Patient Care: Yes (Comment) Care giver support system in place?: Yes (comment) Current home services: DME (rolling walker, wheelchair) Criminal Activity/Legal Involvement Pertinent to Current Situation/Hospitalization: No - Comment as needed  Activities of Daily Living   ADL Screening (condition at time of admission) Independently performs ADLs?: No Does the patient have a NEW difficulty with bathing/dressing/toileting/self-feeding that is expected to last >3 days?: No Does the patient have a NEW difficulty with getting in/out of bed, walking, or climbing stairs that is expected to last >3 days?: No Does the patient have a NEW difficulty with communication that is expected to last >3 days?: No Is the patient deaf or have difficulty  hearing?: No Does the patient have difficulty seeing, even when wearing glasses/contacts?: No Does the patient have difficulty concentrating, remembering, or making decisions?: No  Permission Sought/Granted Permission sought to share information with : Case Manager, Family Supports, PCP Permission granted to share information with : Yes, Verbal Permission Granted  Share Information with NAME: Randall Mccormick     Permission granted to share info w Relationship: wife  Permission granted to share info w Contact Information: [phone]  Emotional Assessment Appearance:: Appears stated age Attitude/Demeanor/Rapport: Engaged Affect (typically observed): Accepting Orientation: : Oriented to Self, Oriented to Place, Oriented to  Time, Oriented to Situation   Psych Involvement: No (comment)  Admission diagnosis:  Sepsis [facility]) [A41.9] Patient Active Problem List   Diagnosis Date Noted   Sepsis (HCC) 09/11/2023   Aspiration pneumonia (HCC) 04/12/2022   Pressure injury of skin 04/09/2022   Uremia 04/07/2022   Acute on chronic anemia 04/07/2022   Acute respiratory failure with hypoxia (HCC) 04/07/2022   Septic shock (HCC)    AKI (acute kidney injury) (HCC)    Hyperkalemia    Acute blood loss anemia    Hematemesis with nausea    Acute encephalopathy 04/05/2022   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) 03/29/2022   OA (osteoarthritis) of knee 03/28/2022   Arthritis of knee 03/28/2022   Abnormal gait due to muscle weakness  03/28/2021   Urinary retention 03/28/2021   Incomplete paraplegia (HCC) 09/08/2020   Spasticity 09/08/2020   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/08/2020   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/2020   Hypoalbuminemia due to protein-calorie malnutrition [facility])    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/2020   Constipation    Neurogenic bowel    Neurogenic bladder    Neuropathic pain    Chronic pain syndrome    Anxiety    Cobalamin deficiency     Degeneration of lumbar or lumbosacral intervertebral disc    Hemorrhoids    Hyperlipidemia    Hypertension    Depression    Gouty arthropathy    Vitamin D deficiency    Multiple fractures of ribs of left side 04/20/2013   PCP:  Paulina Fusi, MD Pharmacy:   [facility] - [place], [place] - [address] [place] [address] Phone: [phone] Fax: [phone]  [facility] - [place], [place] - [address] [place] [address] Phone: [phone] Fax: [phone]  [facility] [address] [place] [address] Phone: [phone] Fax: [phone]     Social Drivers of Health (SDOH) Social History: SDOH Screenings   Food Insecurity: No Food Insecurity (09/11/2023)  Housing: Low Risk  (09/11/2023)  Transportation Needs: No Transportation Needs (09/11/2023)  Utilities: Not At Risk (09/11/2023)  Alcohol Screen: Low Risk  (09/12/2022)  Depression (PHQ2-9): Low Risk  (09/12/2022)  Financial Resource Strain: Low Risk  (09/13/2022)  Physical Activity: Inactive (09/13/2022)  Social Connections: Moderately Integrated (09/11/2023)  Stress: Stress Concern Present (09/13/2022)  Tobacco Use: Medium Risk (09/10/2023)   SDOH Interventions:     Readmission Risk Interventions     No data to display

## 2023-09-11 NOTE — Progress Notes (Signed)
 [facility] ADULT ICU REPLACEMENT PROTOCOL   The patient does apply for the [facility] Adult ICU Electrolyte Replacment Protocol based on the criteria listed below:   1.Exclusion criteria: TCTS, ECMO, Dialysis, and Myasthenia Gravis patients 2. Is GFR >/= 30 ml/min? Yes.    Patient's GFR today is >60 3. Is SCr </= 2? Yes.   Patient's SCr is 0.87 mg/dL 4. Did SCr increase >/= 0.5 in 24 hours? No. 5.Pt's weight >40kg  Yes.   6. Abnormal electrolyte(s): Potassium, Magnesium  7. Electrolytes replaced per protocol 8.  Call MD STAT for K+ </= 2.5, Phos </= 1, or Mag </= 1 Physician:  Dr. Faye Ramsay A Clyde Upshaw 09/11/2023 5:48 AM

## 2023-09-12 DIAGNOSIS — A419 Sepsis, unspecified organism: Secondary | ICD-10-CM | POA: Diagnosis not present

## 2023-09-12 DIAGNOSIS — R6521 Severe sepsis with septic shock: Secondary | ICD-10-CM | POA: Diagnosis not present

## 2023-09-12 LAB — GLUCOSE, CAPILLARY
Glucose-Capillary: 107 mg/dL — ABNORMAL HIGH (ref 70–99)
Glucose-Capillary: 109 mg/dL — ABNORMAL HIGH (ref 70–99)
Glucose-Capillary: 136 mg/dL — ABNORMAL HIGH (ref 70–99)
Glucose-Capillary: 142 mg/dL — ABNORMAL HIGH (ref 70–99)
Glucose-Capillary: 148 mg/dL — ABNORMAL HIGH (ref 70–99)
Glucose-Capillary: 97 mg/dL (ref 70–99)

## 2023-09-12 LAB — BASIC METABOLIC PANEL
Anion gap: 11 (ref 5–15)
BUN: 6 mg/dL — ABNORMAL LOW (ref 8–23)
CO2: 27 mmol/L (ref 22–32)
Calcium: 8.7 mg/dL — ABNORMAL LOW (ref 8.9–10.3)
Chloride: 102 mmol/L (ref 98–111)
Creatinine, Ser: 0.73 mg/dL (ref 0.61–1.24)
GFR, Estimated: >60 mL/min (ref >60)
Glucose, Bld: 100 mg/dL — ABNORMAL HIGH (ref 70–99)
Potassium: 4.1 mmol/L (ref 3.5–5.1)
Sodium: 140 mmol/L (ref 135–145)

## 2023-09-12 LAB — CBC
HCT: 39.1 % (ref 39.0–52.0)
Hemoglobin: 12.7 g/dL — ABNORMAL LOW (ref 13.0–17.0)
MCH: 31.9 pg (ref 26.0–34.0)
MCHC: 32.5 g/dL (ref 30.0–36.0)
MCV: 98.2 fL (ref 80.0–100.0)
Platelets: 205 10*3/uL (ref 150–400)
RBC: 3.98 MIL/uL — ABNORMAL LOW (ref 4.22–5.81)
RDW: 15.9 % — ABNORMAL HIGH (ref 11.5–15.5)
WBC: 8.6 10*3/uL (ref 4.0–10.5)
nRBC: 0 % (ref 0.0–0.2)

## 2023-09-12 LAB — MAGNESIUM: Magnesium: 2 mg/dL (ref 1.7–2.4)

## 2023-09-12 LAB — PHOSPHORUS: Phosphorus: 2.8 mg/dL (ref 2.5–4.6)

## 2023-09-12 MED ORDER — NALOXONE HCL 0.4 MG/ML IJ SOLN: 0.4000 mg | INTRAMUSCULAR | Status: DC | PRN

## 2023-09-12 MED ORDER — INSULIN ASPART 100 UNIT/ML IJ SOLN
0.0000 [IU] | Freq: Three times a day (TID) | INTRAMUSCULAR | Status: DC
  Administered 2023-09-13: 1 [IU] via SUBCUTANEOUS

## 2023-09-12 MED ORDER — BENZONATATE 100 MG PO CAPS
200.0000 mg | ORAL_CAPSULE | Freq: Three times a day (TID) | ORAL | Status: DC | PRN
  Administered 2023-09-13: 200 mg via ORAL
  Filled 2023-09-12: qty 2

## 2023-09-12 MED ORDER — HYDROMORPHONE HCL 1 MG/ML IJ SOLN: 0.5000 mg | Freq: Once | INTRAMUSCULAR | Status: DC | PRN

## 2023-09-12 NOTE — Plan of Care (Signed)
  Problem: Coping: Goal: Ability to adjust to condition or change in health will improve Outcome: Progressing   Problem: Tissue Perfusion: Goal: Adequacy of tissue perfusion will improve Outcome: Progressing   Problem: Skin Integrity: Goal: Risk for impaired skin integrity will decrease Outcome: Progressing

## 2023-09-12 NOTE — Progress Notes (Signed)
 PROGRESS NOTE    Randall Mccormick  QMV:[id number] DOB: Feb 18, 1956 DOA: 09/10/2023 PCP: Paulina Fusi, MD  Outpatient Specialists:     Brief Narrative:  Patient is a 68 year old male with history of hypertension, hyperlipidemia, gouty arthropathy and vitamin D deficiency.  Patient has been mostly wheelchair bound bound after having thoracic spine surgery ([facility]) in 2022. He had T10-pelvis fusion 11/23/2019 (Dr. 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  Patient was admitted with hypotension generalized weakness.  Apparently, patient was seen by his podiatrist on 09/09/2023 for cellulitis of his right big 2 and had the fingernail removed.  On 09/10/2023, patient became lethargic and tired and was said to be altered.  Decreased urine output was reported.  Patient was admitted to the ICU team, managed for sepsis, cellulitis of right big toe, hypotension, hypoxemia and lactic acidosis.  Patient was managed with pressors, IV vancomycin and cefepime.  Patient has been stabilized.  [facility] assumed patient's care today.  09/12/2023: Patient seen.  Patient is eager to be discharged back on.   Assessment & Plan:   Principal Problem:   Sepsis (HCC)   Severe sepsis with septic shock/cellulitis of right great toe:  -Sepsis physiology has resolved significantly. -Hypotension has resolved. -Leukocytosis has resolved. -Continue antibiotics. -Follow cultures.  Chronic pain and constipation: Anxiety: History of spinal surgeries: -Manage expectantly.  Hypotension/history of hypertension: -Hypotension has resolved.  Hyperlipidemia: -Continue Zetia.   DVT prophylaxis: Subcutaneous heparin Code Status: Full code Family Communication:  Disposition Plan:    Consultants:  Patient was transferred from pulmonary/critical care team.  Procedures:  None.  Antimicrobials:  IV  cefepime IV vancomycin   Subjective: -No new complaints. -Patient is eager to be discharged back home  Objective: Vitals:   09/12/23 0440 09/12/23 0500 09/12/23 0819 09/12/23 1554  BP: 106/74  138/72 137/70  Pulse: 80  81 87  Resp: 19  18 18   Temp: 98.3 F (36.8 C)  98 F (36.7 C) 98.5 F (36.9 C)  TempSrc: Oral     SpO2: 97%  96% 96%  Weight:  105.6 kg    Height:        Intake/Output Summary (Last 24 hours) at 09/12/2023 1913 Last data filed at 09/12/2023 0855 Gross per 24 hour  Intake 1596.32 ml  Output 2600 ml  Net -1003.68 ml   Filed Weights   09/10/23 2039 09/11/23 0159 09/12/23 0500  Weight: 106.6 kg 104.8 kg 105.6 kg    Examination:  General exam: Appears calm and comfortable.  Patient is obese. Respiratory system: Clear to auscultation.  Cardiovascular system: S1 & S2 heard Gastrointestinal system: Abdomen is obese, soft and nontender.   Central nervous system: Awake and alert.   Extremities: Cellulitis of right great toe.  Right great toe nail has been removed.  Data Reviewed: I have personally reviewed following labs and imaging studies  CBC: Recent Labs  Lab 09/10/23 2052 09/11/23 0344 09/12/23 0559  WBC 12.5* 10.9* 8.6  NEUTROABS 9.3*  --   --   HGB 14.7 13.2 12.7*  HCT 46.7 40.8 39.1  MCV 100.4* 97.8 98.2  PLT 255 223 205   Basic Metabolic Panel: Recent Labs  Lab 09/10/23 2052 09/11/23 0344 09/12/23 0559  NA 134* 139 140  K 3.6 3.5 4.1  CL 101 106 102  CO2 23 25 27   GLUCOSE 107* 120* 100*  BUN 18 12 6*  CREATININE 1.24 0.87 0.73  CALCIUM 8.1* 8.5* 8.7*  MG  --  1.9 2.0  PHOS  --  2.9 2.8   GFR: Estimated Creatinine Clearance: 104.1 mL/min (by C-G formula based on SCr of 0.73 mg/dL). Liver Function Tests: Recent Labs  Lab 09/10/23 2052  AST 19  ALT 17  ALKPHOS 92  BILITOT 1.0  PROT 6.5  ALBUMIN 3.2*   No results for input(s): "LIPASE", "AMYLASE" in the last 168 hours. No results for input(s): "AMMONIA" in the last  168 hours. Coagulation Profile: No results for input(s): "INR", "PROTIME" in the last 168 hours. Cardiac Enzymes: No results for input(s): "CKTOTAL", "CKMB", "CKMBINDEX", "TROPONINI" in the last 168 hours. BNP (last 3 results) No results for input(s): "PROBNP" in the last 8760 hours. HbA1C: Recent Labs    09/11/23 0344  HGBA1C 5.3   CBG: Recent Labs  Lab 09/12/23 0001 09/12/23 0440 09/12/23 0819 09/12/23 1126 09/12/23 1553  GLUCAP 97 107* 142* 148* 136*   Lipid Profile: No results for input(s): "CHOL", "HDL", "LDLCALC", "TRIG", "CHOLHDL", "LDLDIRECT" in the last 72 hours. Thyroid Function Tests: No results for input(s): "TSH", "T4TOTAL", "FREET4", "T3FREE", "THYROIDAB" in the last 72 hours. Anemia Panel: No results for input(s): "VITAMINB12", "FOLATE", "FERRITIN", "TIBC", "IRON", "RETICCTPCT" in the last 72 hours. Urine analysis:    Component Value Date/Time   COLORURINE YELLOW 09/10/2023 2307   APPEARANCEUR CLEAR 09/10/2023 2307   LABSPEC 1.005 09/10/2023 2307   PHURINE 6.0 09/10/2023 2307   GLUCOSEU NEGATIVE 09/10/2023 2307   HGBUR NEGATIVE 09/10/2023 2307   BILIRUBINUR NEGATIVE 09/10/2023 2307   KETONESUR NEGATIVE 09/10/2023 2307   PROTEINUR NEGATIVE 09/10/2023 2307   NITRITE NEGATIVE 09/10/2023 2307   LEUKOCYTESUR NEGATIVE 09/10/2023 2307   Sepsis Labs: @LABRCNTIP (procalcitonin:4,lacticidven:4)  ) Recent Results (from the past 240 hours)  Culture, blood (routine x 2)     Status: None (Preliminary result)   Collection Time: 09/10/23  8:55 PM   Specimen: BLOOD RIGHT HAND  Result Value Ref Range Status   Specimen Description BLOOD RIGHT HAND  Final   Special Requests   Final    BOTTLES DRAWN AEROBIC AND ANAEROBIC Blood Culture results may not be optimal due to an inadequate volume of blood received in culture bottles   Culture   Final    NO GROWTH 2 DAYS Performed at [facility], [address] [place], [place] [address]    Report Status PENDING   Incomplete  Resp panel by RT-PCR (RSV, Flu A&B, Covid) Anterior Nasal Swab     Status: None   Collection Time: 09/10/23  9:15 PM   Specimen: Anterior Nasal Swab  Result Value Ref Range Status   SARS Coronavirus 2 by RT PCR NEGATIVE NEGATIVE Final   Influenza A by PCR NEGATIVE NEGATIVE Final   Influenza B by PCR NEGATIVE NEGATIVE Final    Comment: (NOTE) The Xpert Xpress SARS-CoV-2/FLU/RSV plus assay is intended as an aid in the diagnosis of influenza from Nasopharyngeal swab specimens and should not be used as a sole basis for treatment. Nasal washings and aspirates are unacceptable for Xpert Xpress SARS-CoV-2/FLU/RSV testing.  Fact Sheet for Patients: BloggerCourse.com  Fact Sheet for Healthcare Providers: SeriousBroker.it  This test is not yet approved or cleared by the [place] FDA and has been authorized for detection and/or diagnosis of SARS-CoV-2 by FDA under an Emergency Use Authorization (EUA). This EUA will remain in effect (meaning this test can be used) for the duration of the COVID-19 declaration under Section  564(b)(1) of the Act, 21 U.S.C. section 360bbb-3(b)(1), unless the authorization is terminated or revoked.     Resp Syncytial Virus by PCR NEGATIVE NEGATIVE Final    Comment: (NOTE) Fact Sheet for Patients: BloggerCourse.com  Fact Sheet for Healthcare Providers: SeriousBroker.it  This test is not yet approved or cleared by the [place] FDA and has been authorized for detection and/or diagnosis of SARS-CoV-2 by FDA under an Emergency Use Authorization (EUA). This EUA will remain in effect (meaning this test can be used) for the duration of the COVID-19 declaration under Section 564(b)(1) of the Act, 21 U.S.C. section 360bbb-3(b)(1), unless the authorization is terminated or revoked.  Performed at [facility], [address]  [place], [place] [address]   MRSA Next Gen by PCR, Nasal     Status: None   Collection Time: 09/11/23  1:38 AM   Specimen: Nasal Mucosa; Nasal Swab  Result Value Ref Range Status   MRSA by PCR Next Gen NOT DETECTED NOT DETECTED Final    Comment: (NOTE) The GeneXpert MRSA Assay (FDA approved for NASAL specimens only), is one component of a comprehensive MRSA colonization surveillance program. It is not intended to diagnose MRSA infection nor to guide or monitor treatment for MRSA infections. Test performance is not FDA approved in patients less than 15 years old. Performed at [facility], [address], [place], [place] [address]   Culture, blood (routine x 2)     Status: None (Preliminary result)   Collection Time: 09/11/23  3:44 AM   Specimen: BLOOD LEFT HAND  Result Value Ref Range Status   Specimen Description BLOOD LEFT HAND  Final   Special Requests   Final    BOTTLES DRAWN AEROBIC AND ANAEROBIC Blood Culture adequate volume   Culture   Final    NO GROWTH 1 DAY Performed at [facility], [address], [place], [place] [address]    Report Status PENDING  Incomplete         Radiology Studies: VAS [place] LOWER EXTREMITY VENOUS (DVT) Result Date: 09/12/2023  Lower Venous DVT Study Patient Name:  LESLEE HAUETER  Date of Exam:   09/11/2023 Medical Rec #: [id number]     Accession #:    [id number] Date of Birth: 1955-09-03    Patient Gender: M Patient Age:   60 years Exam Location:  [facility] Procedure:      VAS [place] LOWER EXTREMITY VENOUS (DVT) Referring Phys: NAVEED [facility] --------------------------------------------------------------------------------  Indications: Edema, and Hypotension, weakness.  Comparison Study: Previous study on 2.5.2022 Performing Technologist: Fernande Bras  Examination Guidelines: A complete evaluation includes B-mode imaging, spectral Doppler, color Doppler, and power Doppler as needed of all accessible portions of each vessel. Bilateral  testing is considered an integral part of a complete examination. Limited examinations for reoccurring indications may be performed as noted. The reflux portion of the exam is performed with the patient in reverse Trendelenburg.  +---------+---------------+---------+-----------+----------+--------------+ RIGHT    CompressibilityPhasicitySpontaneityPropertiesThrombus Aging +---------+---------------+---------+-----------+----------+--------------+ CFV      Full           Yes      Yes                                 +---------+---------------+---------+-----------+----------+--------------+ SFJ      Full           Yes      Yes                                 +---------+---------------+---------+-----------+----------+--------------+  FV Prox  Full                                                        +---------+---------------+---------+-----------+----------+--------------+ FV Mid   Full                                                        +---------+---------------+---------+-----------+----------+--------------+ FV DistalFull                                                        +---------+---------------+---------+-----------+----------+--------------+ PFV      Full                                                        +---------+---------------+---------+-----------+----------+--------------+ POP      Full           Yes      Yes                                 +---------+---------------+---------+-----------+----------+--------------+ PTV      Full                                                        +---------+---------------+---------+-----------+----------+--------------+ PERO     Full                                                        +---------+---------------+---------+-----------+----------+--------------+   +---------+---------------+---------+-----------+----------+--------------+ LEFT      CompressibilityPhasicitySpontaneityPropertiesThrombus Aging +---------+---------------+---------+-----------+----------+--------------+ CFV      Full           Yes      Yes                                 +---------+---------------+---------+-----------+----------+--------------+ SFJ      Full           Yes      Yes                                 +---------+---------------+---------+-----------+----------+--------------+ FV Prox  Full                                                        +---------+---------------+---------+-----------+----------+--------------+  FV Mid   Full                                                        +---------+---------------+---------+-----------+----------+--------------+ FV DistalFull                                                        +---------+---------------+---------+-----------+----------+--------------+ PFV      Full                                                        +---------+---------------+---------+-----------+----------+--------------+ POP      Full           Yes      Yes                                 +---------+---------------+---------+-----------+----------+--------------+ PTV      Full                                                        +---------+---------------+---------+-----------+----------+--------------+ PERO     Full                                                        +---------+---------------+---------+-----------+----------+--------------+     Summary: BILATERAL: - No evidence of deep vein thrombosis seen in the lower extremities, bilaterally. -No evidence of popliteal cyst, bilaterally.   *See table(s) above for measurements and observations. Electronically signed by Lemar Livings MD on 09/12/2023 at 2:24:24 PM.    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 No acute bony abnormality. Specifically, no fracture, subluxation, or dislocation. No visible bone destruction to suggest osteomyelitis. Plantar calcaneal spur. Degenerative changes in the midfoot and hindfoot. Mild soft tissue swelling along the dorsum of the foot. IMPRESSION: No acute bony abnormality.  Chronic changes as above. Electronically Signed   By: Charlett Nose M.D.   On: 09/11/2023 00:50   CT ABDOMEN PELVIS WO CONTRAST Result Date: 09/11/2023 CLINICAL DATA:  Hypotension, generalized weakness EXAM: CT ABDOMEN AND PELVIS WITHOUT CONTRAST TECHNIQUE: Multidetector CT imaging of the abdomen and pelvis was performed following the standard protocol without IV contrast. RADIATION DOSE REDUCTION: This exam was performed according to the departmental dose-optimization program which includes automated exposure control, adjustment of the mA and/or kV according to patient size and/or use of iterative reconstruction technique. COMPARISON:  12/07/2022 FINDINGS: Lower chest: Stable chronic elevation of the right hemidiaphragm. Bibasilar and dependent atelectasis. No  effusions. Hepatobiliary: No focal liver abnormality is seen. Status post cholecystectomy. No biliary dilatation. Pancreas: No focal abnormality or ductal dilatation. Spleen: No focal abnormality.  Normal size. Adrenals/Urinary Tract: Foley catheter within the bladder which is decompressed. Bladder wall appears thickened, possibly related to chronic bladder outlet obstruction. No hydronephrosis. No renal or adrenal lesion. Stomach/Bowel: Moderate stool burden throughout the colon, particularly right colon. Mild gaseous distention of the colon. Stomach and small bowel decompressed, unremarkable. No bowel obstruction or inflammatory process. Vascular/Lymphatic: No evidence of aneurysm or adenopathy. Scattered aortic atherosclerosis. Reproductive: No visible focal abnormality. Other: No free fluid or free air.  Musculoskeletal: Postoperative degenerative changes in the lumbar spine. No acute bony abnormality. IMPRESSION: Moderate stool burden mild gaseous distention of the colon. Aortic atherosclerosis. Bladder wall thickening may be related to chronic bladder outlet obstruction or cystitis. Recommend clinical correlation. Electronically Signed   By: Charlett Nose M.D.   On: 09/11/2023 00:24   CT Angio Chest Pulmonary Embolism (PE) W or WO Contrast Result Date: 09/11/2023 CLINICAL DATA:  Hypotension, generalized weakness. EXAM: CT ANGIOGRAPHY CHEST WITH CONTRAST TECHNIQUE: Multidetector CT imaging of the chest was performed using the standard protocol during bolus administration of intravenous contrast. Multiplanar CT image reconstructions and MIPs were obtained to evaluate the vascular anatomy. RADIATION DOSE REDUCTION: This exam was performed according to the departmental dose-optimization program which includes automated exposure control, adjustment of the mA and/or kV according to patient size and/or use of iterative reconstruction technique. CONTRAST:  75mL OMNIPAQUE IOHEXOL 350 MG/ML SOLN COMPARISON:  04/20/2023 FINDINGS: Cardiovascular: No filling defects in the pulmonary arteries to suggest pulmonary emboli. Heart is normal size. Aorta is normal caliber. Moderate coronary artery and aortic atherosclerosis. Aberrant retroesophageal right subclavian artery. Mediastinum/Nodes: No mediastinal, hilar, or axillary adenopathy. Trachea and esophagus are unremarkable. Thyroid unremarkable. Lungs/Pleura: Elevation of the right hemidiaphragm. Right base atelectasis or scarring. Findings stable since prior study. No acute confluent airspace opacities. Minimal dependent/bibasilar atelectasis. Upper Abdomen: No acute findings Musculoskeletal: Chest wall soft tissues are unremarkable. No acute bony abnormality. Multiple old healed left rib fractures. Review of the MIP images confirms the above findings. IMPRESSION: No evidence  of pulmonary embolus. Moderate coronary artery disease. Stable chronic elevation of the right hemidiaphragm. Bibasilar and dependent atelectasis. Aortic Atherosclerosis (ICD10-I70.0). Electronically Signed   By: Charlett Nose M.D.   On: 09/11/2023 00:21   DG Chest Portable 1 View Result Date: 09/10/2023 CLINICAL DATA:  Hypotension, generalized weakness, hypoxia EXAM: PORTABLE CHEST 1 VIEW COMPARISON:  Radiographs 07/12/2023 FINDINGS: Stable cardiomediastinal silhouette. Aortic atherosclerotic calcification. Low lung volumes accentuate pulmonary vascularity. No focal consolidation, pleural effusion, or pneumothorax. No displaced rib fractures. Thoracic fusion hardware. Remote left rib fractures. IMPRESSION: Low lung volumes without acute cardiopulmonary disease. Electronically Signed   By: Minerva Fester M.D.   On: 09/10/2023 21:10        Scheduled Meds:  aspirin  81 mg Oral Daily   baclofen  20 mg Oral BID   Chlorhexidine Gluconate Cloth  6 each Topical Daily   ezetimibe  10 mg Oral Daily   heparin  5,000 Units Subcutaneous Q8H   insulin aspart  0-9 Units Subcutaneous Q4H   polyethylene glycol  17 g Oral Daily   simethicone  80 mg Oral QID   tamsulosin  0.4 mg Oral Daily   Continuous Infusions:  ceFEPime (MAXIPIME) IV 2 g (09/12/23 1408)   vancomycin Stopped (09/12/23 0225)     LOS: 1 day    Time spent: 35 minutes.  Berton Mount, MD  [facility] Pager #: [phone] 7PM-7AM contact night coverage as above

## 2023-09-12 NOTE — Progress Notes (Addendum)
 [facility] night cross cover note:   I changed the existing order for q4 hour cbg monitoring w/ ssi to qac/hs with associated ssi.   Additionally, the patient is requesting medication for cough.  I subsequently added as needed Tessalon Perles for cough.   Newton Pigg, DO Hospitalist

## 2023-09-13 DIAGNOSIS — A419 Sepsis, unspecified organism: Secondary | ICD-10-CM | POA: Diagnosis not present

## 2023-09-13 DIAGNOSIS — R6521 Severe sepsis with septic shock: Secondary | ICD-10-CM | POA: Diagnosis not present

## 2023-09-13 LAB — CBC WITH DIFFERENTIAL/PLATELET
Abs Immature Granulocytes: 0.05 10*3/uL (ref 0.00–0.07)
Basophils Absolute: 0 10*3/uL (ref 0.0–0.1)
Basophils Relative: 0 %
Eosinophils Absolute: 0.3 10*3/uL (ref 0.0–0.5)
Eosinophils Relative: 4 %
HCT: 40.4 % (ref 39.0–52.0)
Hemoglobin: 13.1 g/dL (ref 13.0–17.0)
Immature Granulocytes: 1 %
Lymphocytes Relative: 17 %
Lymphs Abs: 1.4 10*3/uL (ref 0.7–4.0)
MCH: 31.4 pg (ref 26.0–34.0)
MCHC: 32.4 g/dL (ref 30.0–36.0)
MCV: 96.9 fL (ref 80.0–100.0)
Monocytes Absolute: 0.8 10*3/uL (ref 0.1–1.0)
Monocytes Relative: 9 %
Neutro Abs: 5.7 10*3/uL (ref 1.7–7.7)
Neutrophils Relative %: 69 %
Platelets: 211 10*3/uL (ref 150–400)
RBC: 4.17 MIL/uL — ABNORMAL LOW (ref 4.22–5.81)
RDW: 15.5 % (ref 11.5–15.5)
WBC: 8.2 10*3/uL (ref 4.0–10.5)
nRBC: 0 % (ref 0.0–0.2)

## 2023-09-13 LAB — RENAL FUNCTION PANEL
Albumin: 2.7 g/dL — ABNORMAL LOW (ref 3.5–5.0)
Anion gap: 8 (ref 5–15)
BUN: 9 mg/dL (ref 8–23)
CO2: 27 mmol/L (ref 22–32)
Calcium: 8.7 mg/dL — ABNORMAL LOW (ref 8.9–10.3)
Chloride: 102 mmol/L (ref 98–111)
Creatinine, Ser: 0.71 mg/dL (ref 0.61–1.24)
GFR, Estimated: >60 mL/min (ref >60)
Glucose, Bld: 100 mg/dL — ABNORMAL HIGH (ref 70–99)
Phosphorus: 2.9 mg/dL (ref 2.5–4.6)
Potassium: 3.7 mmol/L (ref 3.5–5.1)
Sodium: 137 mmol/L (ref 135–145)

## 2023-09-13 LAB — GLUCOSE, CAPILLARY
Glucose-Capillary: 114 mg/dL — ABNORMAL HIGH (ref 70–99)
Glucose-Capillary: 114 mg/dL — ABNORMAL HIGH (ref 70–99)
Glucose-Capillary: 122 mg/dL — ABNORMAL HIGH (ref 70–99)
Glucose-Capillary: 122 mg/dL — ABNORMAL HIGH (ref 70–99)

## 2023-09-13 MED ORDER — VANCOMYCIN HCL IN DEXTROSE 1-5 GM/200ML-% IV SOLN
1000.0000 mg | Freq: Two times a day (BID) | INTRAVENOUS | Status: DC
  Administered 2023-09-13 – 2023-09-14 (×3): 1000 mg via INTRAVENOUS
  Filled 2023-09-13 (×3): qty 200

## 2023-09-13 MED ORDER — SENNOSIDES-DOCUSATE SODIUM 8.6-50 MG PO TABS
2.0000 | ORAL_TABLET | Freq: Every day | ORAL | Status: DC
  Administered 2023-09-13: 2 via ORAL
  Filled 2023-09-13: qty 2

## 2023-09-13 NOTE — ED Notes (Signed)
 Pharmacy Antibiotic Note  Randall Mccormick is a 68 y.o. male admitted on 09/10/2023 with sepsis.  Recent outpatient antibiotic with bactrim for cellulitis of toe from podiatry (7 days supply prescribed on 3/18). Pharmacy has been consulted for vancomycin dosing. Cefepime per MD. Metronidazole discontinued after first dose on 3/20.   3/22: Renal function improving, Scr 0.72 this morning. Patient remains afebrile, WBC decreased to 8.2. Blood cultures remain no growth to date.   Plan: Increase vancomycin 1000mg  q12h (eAUC 515, Scr 0.8, Vd 0.5) Continue cefepime 2 gm IV q8h per MD F/u renal function, infectious work up and length of therapy Vancomycin levels as needed  Height: 5\' 8"  (172.7 cm) Weight: 103 kg (227 lb) IBW/kg (Calculated) : 68.4  Temp (24hrs), Avg:98.9 F (37.2 C), Min:98.5 F (36.9 C), Max:99.3 F (37.4 C)  Recent Labs  Lab 09/10/23 2052 09/10/23 2132 09/11/23 0344 09/12/23 0559 09/13/23 0628  WBC 12.5*  --  10.9* 8.6 8.2  CREATININE 1.24  --  0.87 0.73 0.71  LATICACIDVEN  --  3.3* 0.8  --   --     Estimated Creatinine Clearance: 102.8 mL/min (by C-G formula based on SCr of 0.71 mg/dL).    No Known Allergies  Antimicrobials this admission: Cefepime 3/19 > Vancomycin 3/20 > Flagyl 3/20 x1  Microbiology results: 3/19 Bcx: ngtd 3/19 resp panel: neg 3/20 MRSA pcr: neg  Thank you for allowing pharmacy to be a part of this patient's care.  Enos Fling, PharmD PGY-1 Acute Care Pharmacy Resident 09/13/2023 11:16 AM

## 2023-09-13 NOTE — Plan of Care (Signed)

## 2023-09-13 NOTE — Progress Notes (Signed)
 PROGRESS NOTE    Randall Mccormick  UJW:[id number] DOB: Aug 03, 1955 DOA: 09/10/2023 PCP: Paulina Fusi, MD  Outpatient Specialists:     Brief Narrative:  Patient is a 68 year old male with history of hypertension, hyperlipidemia, gouty arthropathy and vitamin D deficiency.  Patient has been mostly wheelchair bound bound after having thoracic spine surgery ([facility]) in 2022. He had T10-pelvis fusion 11/23/2019 (Dr. 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  Patient was admitted with hypotension generalized weakness.  Apparently, patient was seen by his podiatrist on 09/09/2023 for cellulitis of his right big 2 and had the fingernail removed.  On 09/10/2023, patient became lethargic and tired and was said to be altered.  Decreased urine output was reported.  Patient was admitted to the ICU team, managed for sepsis, cellulitis of right big toe, hypotension, hypoxemia and lactic acidosis.  Patient was managed with pressors, IV vancomycin and cefepime.  Patient has been stabilized.  TR[facility] assumed patient's care today.  09/13/2023: Patient seen.  Sepsis physiology has improved.  Awaiting blood cultures.  Cultures are negative to date.  Will consult PT OT.  Will also discontinue Foley catheter.   Assessment & Plan:   Principal Problem:   Sepsis (HCC)   Severe sepsis with septic shock/cellulitis of right great toe:  -Sepsis physiology has resolved significantly. -Hypotension has resolved. -Leukocytosis has resolved. -Continue antibiotics. -Follow cultures.  Chronic pain and constipation: Anxiety: History of spinal surgeries: -Manage expectantly.  Hypotension/history of hypertension: -Hypotension has resolved.  Hyperlipidemia: -Continue Zetia.   DVT prophylaxis: Subcutaneous heparin Code Status: Full code Family Communication:  Disposition Plan:    Consultants:   Patient was transferred from pulmonary/critical care team.  Procedures:  None.  Antimicrobials:  IV cefepime IV vancomycin   Subjective: -No new complaints. -Patient is eager to be discharged back home  Objective: Vitals:   09/12/23 1554 09/12/23 2036 09/13/23 0500 09/13/23 0758  BP: 137/70 135/66 (!) 142/83 135/72  Pulse: 87 71 86 83  Resp: 18 18  17   Temp: 98.5 F (36.9 C) 99.3 F (37.4 C) 98.8 F (37.1 C)   TempSrc:  Oral Oral   SpO2: 96% 96% 98% 99%  Weight:   103 kg   Height:        Intake/Output Summary (Last 24 hours) at 09/13/2023 1128 Last data filed at 09/13/2023 0900 Gross per 24 hour  Intake --  Output 650 ml  Net -650 ml   Filed Weights   09/11/23 0159 09/12/23 0500 09/13/23 0500  Weight: 104.8 kg 105.6 kg 103 kg    Examination:  General exam: Appears calm and comfortable.  Patient is obese. Respiratory system: Clear to auscultation.  Cardiovascular system: S1 & S2 heard Gastrointestinal system: Abdomen is obese, soft and nontender.   Central nervous system: Awake and alert.   Extremities: Cellulitis of right great toe.  Right great toe nail has been removed.  Data Reviewed: I have personally reviewed following labs and imaging studies  CBC: Recent Labs  Lab 09/10/23 2052 09/11/23 0344 09/12/23 0559 09/13/23 0628  WBC 12.5* 10.9* 8.6 8.2  NEUTROABS 9.3*  --   --  5.7  HGB 14.7 13.2 12.7* 13.1  HCT 46.7 40.8 39.1 40.4  MCV 100.4* 97.8 98.2 96.9  PLT 255 223 205 211   Basic Metabolic Panel: Recent Labs  Lab 09/10/23 2052 09/11/23 0344 09/12/23 0559 09/13/23 0628  NA 134* 139  140 137  K 3.6 3.5 4.1 3.7  CL 101 106 102 102  CO2 23 25 27 27   GLUCOSE 107* 120* 100* 100*  BUN 18 12 6* 9  CREATININE 1.24 0.87 0.73 0.71  CALCIUM 8.1* 8.5* 8.7* 8.7*  MG  --  1.9 2.0  --   PHOS  --  2.9 2.8 2.9   GFR: Estimated Creatinine Clearance: 102.8 mL/min (by C-G formula based on SCr of 0.71 mg/dL). Liver Function Tests: Recent Labs   Lab 09/10/23 2052 09/13/23 0628  AST 19  --   ALT 17  --   ALKPHOS 92  --   BILITOT 1.0  --   PROT 6.5  --   ALBUMIN 3.2* 2.7*   No results for input(s): "LIPASE", "AMYLASE" in the last 168 hours. No results for input(s): "AMMONIA" in the last 168 hours. Coagulation Profile: No results for input(s): "INR", "PROTIME" in the last 168 hours. Cardiac Enzymes: No results for input(s): "CKTOTAL", "CKMB", "CKMBINDEX", "TROPONINI" in the last 168 hours. BNP (last 3 results) No results for input(s): "PROBNP" in the last 8760 hours. HbA1C: Recent Labs    09/11/23 0344  HGBA1C 5.3   CBG: Recent Labs  Lab 09/12/23 0819 09/12/23 1126 09/12/23 1553 09/12/23 2037 09/13/23 0756  GLUCAP 142* 148* 136* 109* 114*   Lipid Profile: No results for input(s): "CHOL", "HDL", "LDLCALC", "TRIG", "CHOLHDL", "LDLDIRECT" in the last 72 hours. Thyroid Function Tests: No results for input(s): "TSH", "T4TOTAL", "FREET4", "T3FREE", "THYROIDAB" in the last 72 hours. Anemia Panel: No results for input(s): "VITAMINB12", "FOLATE", "FERRITIN", "TIBC", "IRON", "RETICCTPCT" in the last 72 hours. Urine analysis:    Component Value Date/Time   COLORURINE YELLOW 09/10/2023 2307   APPEARANCEUR CLEAR 09/10/2023 2307   LABSPEC 1.005 09/10/2023 2307   PHURINE 6.0 09/10/2023 2307   GLUCOSEU NEGATIVE 09/10/2023 2307   HGBUR NEGATIVE 09/10/2023 2307   BILIRUBINUR NEGATIVE 09/10/2023 2307   KETONESUR NEGATIVE 09/10/2023 2307   PROTEINUR NEGATIVE 09/10/2023 2307   NITRITE NEGATIVE 09/10/2023 2307   LEUKOCYTESUR NEGATIVE 09/10/2023 2307   Sepsis Labs: @LABRCNTIP (procalcitonin:4,lacticidven:4)  ) Recent Results (from the past 240 hours)  Culture, blood (routine x 2)     Status: None (Preliminary result)   Collection Time: 09/10/23  8:55 PM   Specimen: BLOOD RIGHT HAND  Result Value Ref Range Status   Specimen Description BLOOD RIGHT HAND  Final   Special Requests   Final    BOTTLES DRAWN AEROBIC AND  ANAEROBIC Blood Culture results may not be optimal due to an inadequate volume of blood received in culture bottles   Culture   Final    NO GROWTH 3 DAYS Performed at [facility], [address], [place], [place] [address]    Report Status PENDING  Incomplete  Resp panel by RT-PCR (RSV, Flu A&B, Covid) Anterior Nasal Swab     Status: None   Collection Time: 09/10/23  9:15 PM   Specimen: Anterior Nasal Swab  Result Value Ref Range Status   SARS Coronavirus 2 by RT PCR NEGATIVE NEGATIVE Final   Influenza A by PCR NEGATIVE NEGATIVE Final   Influenza B by PCR NEGATIVE NEGATIVE Final    Comment: (NOTE) The Xpert Xpress SARS-CoV-2/FLU/RSV plus assay is intended as an aid in the diagnosis of influenza from Nasopharyngeal swab specimens and should not be used as a sole basis for treatment. Nasal washings and aspirates are unacceptable for Xpert Xpress SARS-CoV-2/FLU/RSV testing.  Fact Sheet for Patients: BloggerCourse.com  Fact Sheet  for Healthcare Providers: SeriousBroker.it  This test is not yet approved or cleared by the [place] and has been authorized for detection and/or diagnosis of SARS-CoV-2 by FDA under an Emergency Use Authorization (EUA). This EUA will remain in effect (meaning this test can be used) for the duration of the COVID-19 declaration under Section 564(b)(1) of the Act, 21 U.S.C. section 360bbb-3(b)(1), unless the authorization is terminated or revoked.     Resp Syncytial Virus by PCR NEGATIVE NEGATIVE Final    Comment: (NOTE) Fact Sheet for Patients: BloggerCourse.com  Fact Sheet for Healthcare Providers: SeriousBroker.it  This test is not yet approved or cleared by the [place] FDA and has been authorized for detection and/or diagnosis of SARS-CoV-2 by FDA under an Emergency Use Authorization (EUA). This EUA will remain in effect (meaning  this test can be used) for the duration of the COVID-19 declaration under Section 564(b)(1) of the Act, 21 U.S.C. section 360bbb-3(b)(1), unless the authorization is terminated or revoked.  Performed at [facility], [address], [place], [place] [address]   MRSA Next Gen by PCR, Nasal     Status: None   Collection Time: 09/11/23  1:38 AM   Specimen: Nasal Mucosa; Nasal Swab  Result Value Ref Range Status   MRSA by PCR Next Gen NOT DETECTED NOT DETECTED Final    Comment: (NOTE) The GeneXpert MRSA Assay (FDA approved for NASAL specimens only), is one component of a comprehensive MRSA colonization surveillance program. It is not intended to diagnose MRSA infection nor to guide or monitor treatment for MRSA infections. Test performance is not FDA approved in patients less than 69 years old. Performed at [facility], [address], [place], [place] [address]   Culture, blood (routine x 2)     Status: None (Preliminary result)   Collection Time: 09/11/23  3:44 AM   Specimen: BLOOD LEFT HAND  Result Value Ref Range Status   Specimen Description BLOOD LEFT HAND  Final   Special Requests   Final    BOTTLES DRAWN AEROBIC AND ANAEROBIC Blood Culture adequate volume   Culture   Final    NO GROWTH 2 DAYS Performed at [facility], [address], [place], [place] [address]    Report Status PENDING  Incomplete         Radiology Studies: No results found.       Scheduled Meds:  aspirin  81 mg Oral Daily   baclofen  20 mg Oral BID   Chlorhexidine Gluconate Cloth  6 each Topical Daily   ezetimibe  10 mg Oral Daily   heparin  5,000 Units Subcutaneous Q8H   insulin aspart  0-9 Units Subcutaneous TID WC   polyethylene glycol  17 g Oral Daily   senna-docusate  2 tablet Oral QHS   simethicone  80 mg Oral QID   tamsulosin  0.4 mg Oral Daily   Continuous Infusions:  ceFEPime (MAXIPIME) IV 2 g (09/13/23 0605)   vancomycin       LOS: 2 days    Time  spent: 35 minutes.    Berton Mount, MD  [facility] Pager #: [phone] 7PM-7AM contact night coverage as above

## 2023-09-13 NOTE — Plan of Care (Signed)
  Problem: Education: Goal: Ability to describe self-care measures that may prevent or decrease complications (Diabetes Survival Skills Education) will improve Outcome: Progressing   Problem: Coping: Goal: Ability to adjust to condition or change in health will improve Outcome: Progressing   Problem: Nutrition: Goal: Adequate nutrition will be maintained Outcome: Progressing   Problem: Coping: Goal: Level of anxiety will decrease Outcome: Progressing   Problem: Elimination: Goal: Will not experience complications related to urinary retention Outcome: Progressing   Problem: Skin Integrity: Goal: Risk for impaired skin integrity will decrease Outcome: Progressing

## 2023-09-14 DIAGNOSIS — R6521 Severe sepsis with septic shock: Secondary | ICD-10-CM | POA: Diagnosis not present

## 2023-09-14 DIAGNOSIS — A419 Sepsis, unspecified organism: Secondary | ICD-10-CM | POA: Diagnosis not present

## 2023-09-14 LAB — GLUCOSE, CAPILLARY
Glucose-Capillary: 104 mg/dL — ABNORMAL HIGH (ref 70–99)
Glucose-Capillary: 147 mg/dL — ABNORMAL HIGH (ref 70–99)

## 2023-09-14 MED ORDER — GABAPENTIN 300 MG PO CAPS: 300.0000 mg | ORAL_CAPSULE | Freq: Every day | ORAL | Status: DC

## 2023-09-14 MED ORDER — AMLODIPINE BESYLATE 2.5 MG PO TABS: 2.5000 mg | ORAL_TABLET | Freq: Every day | ORAL | 1 refills | Status: DC

## 2023-09-14 MED ORDER — OXYCODONE HCL 10 MG PO TABS: 10.0000 mg | ORAL_TABLET | Freq: Three times a day (TID) | ORAL | Status: DC | PRN

## 2023-09-14 MED ORDER — POLYETHYLENE GLYCOL 3350 17 G PO PACK: 17.0000 g | PACK | Freq: Every day | ORAL | 0 refills | Status: DC

## 2023-09-14 MED ORDER — AMOXICILLIN-POT CLAVULANATE 875-125 MG PO TABS: 1.0000 | ORAL_TABLET | Freq: Two times a day (BID) | ORAL | 0 refills | Status: AC

## 2023-09-14 MED ORDER — DOXYCYCLINE HYCLATE 100 MG PO TABS: 100.0000 mg | ORAL_TABLET | Freq: Two times a day (BID) | ORAL | 0 refills | Status: AC

## 2023-09-14 MED ORDER — SENNOSIDES-DOCUSATE SODIUM 8.6-50 MG PO TABS: 2.0000 | ORAL_TABLET | Freq: Every day | ORAL | 1 refills | Status: DC

## 2023-09-14 NOTE — Progress Notes (Signed)
 Pt has been DC. Per Gust Brooms, RN, wife is requesting therapy at home. Met with pt and wife. Wife is requesting HH therapy. She reports pt has a RW, W/C, BSC, and a cane. She will provide transportation at time of DC. She reports that pt has used [facility] in the past and she would like to use them again. Contacted [facility] and the call was transferred to [place]. Beverely Pace reports that they don't take outside referrals on the weekends. He reports I can fax the referral and someone would look at the referral tomorrow to see if they have the staff. Informed wife that [facility] doesn't take outside referrals on the weekends. Discussed CMS Medicare.gov compare list. She chose [facility]. Contacted Cheryl at [facility] and she accepted the referral.

## 2023-09-14 NOTE — Plan of Care (Signed)
  Problem: Nutritional: Goal: Progress toward achieving an optimal weight will improve Outcome: Progressing   Problem: Skin Integrity: Goal: Risk for impaired skin integrity will decrease Outcome: Progressing   Problem: Tissue Perfusion: Goal: Adequacy of tissue perfusion will improve Outcome: Progressing

## 2023-09-14 NOTE — Evaluation (Signed)
 Physical Therapy Evaluation Patient Details Name: Randall Mccormick MRN: [id number] DOB: 04/04/1956 Today's Date: 09/14/2023  History of Present Illness  Pt is 68 yo presenting [facility] ED on 3/19 via EMS due to hypotension, generalized weakness. Foudnt o have septic shock in the setting of cellulitis. Pt has PMHx:  urinary retention, incomplete paraplegia, spasticity, wheelchair dependence, thoracic spondylosis, neurogenic bladder, neuropathic pain, chronic pain, anxiety, gout, HTN, L rib fractures  Clinical Impression  Pt is presenting below baseline level of functioning. Currently pt is Mod A for bed mobility (sleeps in lift recliner at home), Min-Mod A +2 for sit to stand and CGA for short distance gait with RW. Pt has supportive family at home 24/7 who can provide assist with all needed DME. Pt was previously going to [facility]. Due to pt current functional status, home set up and available assistance at home recommending skilled physical therapy services 3x/week in order to address strength, balance and functional mobility to decrease risk for falls, injury and re-hospitalization.           If plan is discharge home, recommend the following: A little help with walking and/or transfers;Assistance with cooking/housework;Assist for transportation;Help with stairs or ramp for entrance     Equipment Recommendations None recommended by PT     Functional Status Assessment Patient has had a recent decline in their functional status and demonstrates the ability to make significant improvements in function in a reasonable and predictable amount of time.     Precautions / Restrictions Precautions Precautions: Fall Recall of Precautions/Restrictions: Intact Restrictions Weight Bearing Restrictions Per Provider Order: No      Mobility  Bed Mobility Overal bed mobility: Needs Assistance Bed Mobility: Supine to Sit     Supine to sit: Mod assist     General bed mobility comments: Mod A for trunk to mid  line. Mod - Max A scooting to EOB. Pt sleeps in lift recliner.    Transfers Overall transfer level: Needs assistance Equipment used: Rolling walker (2 wheels) Transfers: Sit to/from Stand, Bed to chair/wheelchair/BSC Sit to Stand: Mod assist, Min assist, +2 physical assistance   Step pivot transfers: Contact guard assist       General transfer comment: Min-Mod A for sit to stand. Initially increased assist from EOB to RW with verbal cues for safe hand placement with good re-call. CGA  for stepping with RW    Ambulation/Gait Ambulation/Gait assistance: Contact guard assist Gait Distance (Feet): 10 Feet (2x) Assistive device: Rolling walker (2 wheels) Gait Pattern/deviations: Step-to pattern, Trunk flexed, Knee hyperextension - right, Knee hyperextension - left Gait velocity: decreased Gait velocity interpretation: <1.31 ft/sec, indicative of household ambulator   General Gait Details: Step to gait pattern leading with the LLE. Trunk flexed throughout, Hyperextension at bil knees without buckling.     Balance Overall balance assessment: Needs assistance Sitting-balance support: Single extremity supported, Feet supported, Feet unsupported Sitting balance-Leahy Scale: Fair Sitting balance - Comments: no overt LOB   Standing balance support: Bilateral upper extremity supported, Reliant on assistive device for balance, During functional activity Standing balance-Leahy Scale: Poor Standing balance comment: CGA for safety           Pertinent Vitals/Pain Pain Assessment Pain Assessment: 0-10 Pain Score: 6  Pain Location: bil LE Pain Descriptors / Indicators: Aching, Burning, Constant Pain Intervention(s): Monitored during session, Limited activity within patient's tolerance    Home Living Family/patient expects to be discharged to:: Private residence Living Arrangements: Spouse/significant other Available Help at Discharge: Family;Available 24  hours/day Type of Home:  House Home Access: Ramped entrance       Home Layout: One level Home Equipment: Agricultural consultant (2 wheels);Rollator (4 wheels);Cane - single point;Shower seat;BSC/3in1;Grab bars - toilet;Wheelchair - manual;Grab bars - tub/shower;Lift chair      Prior Function Prior Level of Function : Needs assist       Physical Assist : ADLs (physical)   ADLs (physical): Grooming;Bathing;Dressing;Toileting Mobility Comments: Pt reports that he was able to walk around his house with rollator. Sleeps in recliner at home. Pt reports 1 fall in 6 months reporting his legs just gave out. ADLs Comments: Spouse assisted with dressing, showering and toileting.     Extremity/Trunk Assessment   Upper Extremity Assessment Upper Extremity Assessment: Defer to OT evaluation    Lower Extremity Assessment Lower Extremity Assessment: Generalized weakness    Cervical / Trunk Assessment Cervical / Trunk Assessment: Kyphotic  Communication   Communication Communication: No apparent difficulties    Cognition Arousal: Alert Behavior During Therapy: WFL for tasks assessed/performed   PT - Cognitive impairments: No apparent impairments       Following commands: Intact       Cueing Cueing Techniques: Verbal cues     General Comments General comments (skin integrity, edema, etc.): no signs/symptoms of cardiac/respiratory distress throughout session. Pt fatigues quickly        Assessment/Plan    PT Assessment Patient needs continued PT services  PT Problem List Decreased strength;Decreased mobility;Decreased balance       PT Treatment Interventions DME instruction;Therapeutic exercise;Gait training;Balance training;Stair training;Cognitive remediation;Functional mobility training;Therapeutic activities;Patient/family education;Neuromuscular re-education;Wheelchair mobility training    PT Goals (Current goals can be found in the Care Plan section)  Acute Rehab PT Goals Patient Stated Goal: to  return home and go back to [facility] PT Goal Formulation: With patient Time For Goal Achievement: 09/28/23 Potential to Achieve Goals: Good    Frequency Min 2X/week        AM-PAC PT "6 Clicks" Mobility  Outcome Measure Help needed turning from your back to your side while in a flat bed without using bedrails?: A Lot Help needed moving from lying on your back to sitting on the side of a flat bed without using bedrails?: A Lot Help needed moving to and from a bed to a chair (including a wheelchair)?: A Lot Help needed standing up from a chair using your arms (e.g., wheelchair or bedside chair)?: A Lot Help needed to walk in hospital room?: A Little Help needed climbing 3-5 steps with a railing? : Total 6 Click Score: 12    End of Session Equipment Utilized During Treatment: Gait belt Activity Tolerance: Patient tolerated treatment well;Patient limited by fatigue Patient left: in chair;with chair alarm set;with call bell/phone within reach Nurse Communication: Mobility status PT Visit Diagnosis: Unsteadiness on feet (R26.81);Other abnormalities of gait and mobility (R26.89)    Time: 1137-1200 PT Time Calculation (min) (ACUTE ONLY): 23 min   Charges:   PT Evaluation $PT Eval Low Complexity: 1 Low   PT General Charges $$ ACUTE PT VISIT: 1 Visit        Harrel Carina, DPT, CLT  Acute Rehabilitation Services Office: [phone] (Secure chat preferred)   Claudia Desanctis 09/14/2023, 12:10 PM

## 2023-09-14 NOTE — Discharge Summary (Signed)
 Physician Discharge Summary  Patient ID: Randall Mccormick MRN: [id number] DOB/AGE: [id number] 69 y.o.  Admit date: 09/10/2023 Discharge date: 09/14/2023  Admission Diagnoses:  Discharge Diagnoses:  Principal Problem:   Sepsis [facility])   Discharged Condition: stable  Hospital Course:  Patient is a 68 year old male with past medical history significant for hypertension, hyperlipidemia, gouty arthropathy and vitamin D deficiency.  Patient has been mostly wheelchair bound, after having thoracic spine surgery ([facility]) in 2022.  Patient had T10-pelvis fusion 11/23/2019 (Dr. 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  Patient was admitted with hypotension and generalized weakness.  Apparently, patient was seen by his podiatrist on 09/09/2023 for cellulitis of his right big toe and had the fingernail removed.  On 09/10/2023 (day of admission), patient became lethargic, tired and was said to be altered, with decreased urine output was reported.  Patient was admitted to the ICU team, managed for sepsis, cellulitis of right big toe, hypotension, hypoxemia and lactic acidosis.  Patient was managed with pressors, IV vancomycin and cefepime.  Sepsis physiology resolved and patient was transferred to the hospitalist team.  Antibiotics have been continued.  Patient has remained stable.  Patient will be discharged back on to the care of the primary care provider.  Patient will be discharged on oral antibiotics.  Patient will follow-up with podiatry team on discharge.    Severe sepsis with septic shock/cellulitis of right great toe:  -Sepsis physiology has resolved significantly. -Hypotension has resolved. -Leukocytosis has resolved. -Antibiotics will be changed to oral on discharge. -Blood cultures have been negative to date.     Chronic pain and constipation: Anxiety: History of spinal  surgeries: -Manage expectantly.   Hypotension/history of hypertension: -Hypotension has resolved.   Hyperlipidemia: -Continue Zetia.  Consults:  Patient was initially admitted to the ICU team due to severe sepsis with septic shock.  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 No acute bony abnormality. Specifically, no fracture, subluxation, or dislocation. No visible bone destruction to suggest osteomyelitis. Plantar calcaneal spur. Degenerative changes in the midfoot and hindfoot. Mild soft tissue swelling along the dorsum of the foot.   IMPRESSION: No acute bony abnormality.  Chronic changes as above.     Electronically Signed   By: Charlett Nose M.D.   On: 09/11/2023 00:50     CT ABDOMEN AND PELVIS WITHOUT CONTRAST:   TECHNIQUE: Multidetector CT imaging of the abdomen and pelvis was performed following the standard protocol without IV contrast.   RADIATION DOSE REDUCTION: This exam was performed according to the departmental dose-optimization program which includes automated exposure control, adjustment of the mA and/or kV according to patient size and/or use of iterative reconstruction technique.   COMPARISON:  12/07/2022   FINDINGS: Lower chest: Stable chronic elevation of the right hemidiaphragm. Bibasilar and dependent atelectasis. No effusions.   Hepatobiliary: No focal liver abnormality is seen. Status post cholecystectomy. No biliary dilatation.   Pancreas: No focal abnormality or ductal dilatation.   Spleen: No focal abnormality.  Normal size.   Adrenals/Urinary Tract: Foley catheter within the bladder which is decompressed. Bladder wall appears thickened, possibly related to chronic bladder outlet obstruction. No hydronephrosis. No renal or adrenal lesion.   Stomach/Bowel: Moderate stool burden throughout the colon, particularly right colon. Mild  gaseous distention of the colon. Stomach and small  bowel decompressed, unremarkable. No bowel obstruction or inflammatory process.   Vascular/Lymphatic: No evidence of aneurysm or adenopathy. Scattered aortic atherosclerosis.   Reproductive: No visible focal abnormality.   Other: No free fluid or free air.   Musculoskeletal: Postoperative degenerative changes in the lumbar spine. No acute bony abnormality.   IMPRESSION: Moderate stool burden mild gaseous distention of the colon.   Aortic atherosclerosis.   Bladder wall thickening may be related to chronic bladder outlet obstruction or cystitis. Recommend clinical correlation.     Electronically Signed   By: Charlett Nose M.D.   On: 09/11/2023 00:24      CT ANGIOGRAPHY CHEST WITH CONTRAST:   TECHNIQUE: Multidetector CT imaging of the chest was performed using the standard protocol during bolus administration of intravenous contrast. Multiplanar CT image reconstructions and MIPs were obtained to evaluate the vascular anatomy.   RADIATION DOSE REDUCTION: This exam was performed according to the departmental dose-optimization program which includes automated exposure control, adjustment of the mA and/or kV according to patient size and/or use of iterative reconstruction technique.   CONTRAST:  75mL OMNIPAQUE IOHEXOL 350 MG/ML SOLN   COMPARISON:  04/20/2023   FINDINGS: Cardiovascular: No filling defects in the pulmonary arteries to suggest pulmonary emboli. Heart is normal size. Aorta is normal caliber. Moderate coronary artery and aortic atherosclerosis. Aberrant retroesophageal right subclavian artery.   Mediastinum/Nodes: No mediastinal, hilar, or axillary adenopathy. Trachea and esophagus are unremarkable. Thyroid unremarkable.   Lungs/Pleura: Elevation of the right hemidiaphragm. Right base atelectasis or scarring. Findings stable since prior study. No acute confluent airspace opacities. Minimal  dependent/bibasilar atelectasis.   Upper Abdomen: No acute findings   Musculoskeletal: Chest wall soft tissues are unremarkable. No acute bony abnormality. Multiple old healed left rib fractures.   Review of the MIP images confirms the above findings.   IMPRESSION: No evidence of pulmonary embolus.   Moderate coronary artery disease.   Stable chronic elevation of the right hemidiaphragm. Bibasilar and dependent atelectasis.   Aortic Atherosclerosis (ICD10-I70.0).     Electronically Signed   By: Charlett Nose M.D.   On: 09/11/2023 00:21     PORTABLE CHEST 1 VIEW:   COMPARISON:  Radiographs 07/12/2023   FINDINGS: Stable cardiomediastinal silhouette. Aortic atherosclerotic calcification. Low lung volumes accentuate pulmonary vascularity. No focal consolidation, pleural effusion, or pneumothorax. No displaced rib fractures. Thoracic fusion hardware. Remote left rib fractures.   IMPRESSION: Low lung volumes without acute cardiopulmonary disease.     Electronically Signed   By: Minerva Fester M.D.   On: 09/10/2023 21:10      Discharge Exam: Blood pressure 136/79, pulse 77, temperature 98.6 F (37 C), temperature source Oral, resp. rate 17, height 5\' 8"  (1.727 m), weight 104.3 kg, SpO2 96%.   Disposition: Discharge disposition: 01-Home or Self Care       Discharge Instructions     Diet - low sodium heart healthy   Complete by: As directed    Increase activity slowly   Complete by: As directed       Allergies as of 09/14/2023   No Known Allergies      Medication List     STOP taking these medications    ALPRAZolam 1 MG tablet Commonly known as: XANAX   FLUoxetine 20 MG capsule Commonly known as: PROZAC   lisinopril 40 MG tablet Commonly known as: ZESTRIL   meloxicam 7.5 MG tablet Commonly known as: MOBIC   OLANZapine 10 MG tablet Commonly known as: ZYPREXA   oxymetazoline 0.05 %  nasal spray Commonly known as: AFRIN    sulfamethoxazole-trimethoprim 800-160 MG tablet Commonly known as: BACTRIM DS       TAKE these medications    allopurinol 300 MG tablet Commonly known as: ZYLOPRIM Take 300 mg by mouth daily.   amLODipine 2.5 MG tablet Commonly known as: NORVASC Take 1 tablet (2.5 mg total) by mouth daily. What changed:  medication strength how much to take   amoxicillin-clavulanate 875-125 MG tablet Commonly known as: AUGMENTIN Take 1 tablet by mouth 2 (two) times daily for 7 days.   aspirin 81 MG chewable tablet Chew 1 tablet (81 mg total) by mouth 2 (two) times daily.   Baclofen 5 MG Tabs Take 5 mg by mouth 3 (three) times daily. What changed:  how much to take when to take this   doxycycline 100 MG tablet Commonly known as: VIBRA-TABS Take 1 tablet (100 mg total) by mouth 2 (two) times daily for 7 days.   ezetimibe 10 MG tablet Commonly known as: ZETIA Take 10 mg by mouth at bedtime.   folic acid 1 MG tablet Commonly known as: FOLVITE Take 1 mg by mouth daily.   gabapentin 300 MG capsule Commonly known as: NEURONTIN Take 1 capsule (300 mg total) by mouth daily.   Oxycodone HCl 10 MG Tabs Take 1 tablet (10 mg total) by mouth 3 (three) times daily as needed for severe pain (pain score 7-10) or moderate pain (pain score 4-6). What changed:  when to take this reasons to take this   pantoprazole 40 MG tablet Commonly known as: PROTONIX   polyethylene glycol 17 g packet Commonly known as: MIRALAX / GLYCOLAX Take 17 g by mouth daily. Start taking on: September 15, 2023   senna-docusate 8.6-50 MG tablet Commonly known as: Senokot-S Take 2 tablets by mouth at bedtime.   tamsulosin 0.4 MG Caps capsule Commonly known as: FLOMAX TAKE 1 CAPSULE EVERY DAY AFTER SUPPER   Ventolin HFA 108 (90 Base) MCG/ACT inhaler Generic drug: albuterol        Time spent: 35 minutes.  SignedBarnetta Chapel 09/14/2023, 3:39 PM

## 2023-09-14 NOTE — Evaluation (Signed)
 Occupational Therapy Evaluation Patient Details Name: Randall Mccormick MRN: [id number] DOB: 1956/04/25 Today's Date: 09/14/2023   History of Present Illness   Pt is 68 yo presenting [facility] ED on 3/19 via EMS due to hypotension, generalized weakness. Foudnt o have septic shock in the setting of cellulitis. Pt has PMHx:  urinary retention, incomplete paraplegia, spasticity, wheelchair dependence, thoracic spondylosis, neurogenic bladder, neuropathic pain, chronic pain, anxiety, gout, HTN, L rib fractures     Clinical Impressions Pt reports having assist at baseline with ADLs/functional mobility, lives with spouse who can assist. Pt uses rollator for mobility and has assist for bathing, grooming, toileting at home. Pt currently needing up to mod A for ADLs, mod A for bed mobility and min-mod A for transfers with RW. Pt with mild L side lean with standing initially. Pt presenting with impairments listed below, will follow acutely. Recommend resuming OP OT at d/c.      If plan is discharge home, recommend the following:   A little help with walking and/or transfers;A lot of help with bathing/dressing/bathroom;Assistance with cooking/housework;Direct supervision/assist for financial management;Direct supervision/assist for medications management;Assist for transportation;Help with stairs or ramp for entrance     Functional Status Assessment   Patient has had a recent decline in their functional status and demonstrates the ability to make significant improvements in function in a reasonable and predictable amount of time.     Equipment Recommendations   None recommended by OT     Recommendations for Other Services   PT consult     Precautions/Restrictions   Precautions Precautions: Fall Recall of Precautions/Restrictions: Intact Restrictions Weight Bearing Restrictions Per Provider Order: No     Mobility Bed Mobility Overal bed mobility: Needs Assistance Bed Mobility: Supine  to Sit     Supine to sit: Mod assist          Transfers Overall transfer level: Needs assistance Equipment used: Rolling walker (2 wheels) Transfers: Sit to/from Stand, Bed to chair/wheelchair/BSC Sit to Stand: Mod assist, Min assist, +2 physical assistance     Step pivot transfers: Contact guard assist            Balance   Sitting-balance support: Feet supported, Feet unsupported, Bilateral upper extremity supported                                       ADL either performed or assessed with clinical judgement   ADL Overall ADL's : Needs assistance/impaired Eating/Feeding: Supervision/ safety   Grooming: Supervision/safety   Upper Body Bathing: Minimal assistance   Lower Body Bathing: Moderate assistance   Upper Body Dressing : Minimal assistance   Lower Body Dressing: Moderate assistance   Toilet Transfer: Minimal assistance;Ambulation;Regular Toilet;Rolling walker (2 wheels)   Toileting- Clothing Manipulation and Hygiene: Moderate assistance       Functional mobility during ADLs: Minimal assistance;Rolling walker (2 wheels)       Vision   Vision Assessment?: No apparent visual deficits     Perception Perception: Not tested       Praxis Praxis: Not tested       Pertinent Vitals/Pain Pain Assessment Pain Assessment: Faces Pain Score: 6  Faces Pain Scale: Hurts even more Pain Location: bil LE Pain Descriptors / Indicators: Aching, Burning, Constant Pain Intervention(s): Limited activity within patient's tolerance, Monitored during session, Repositioned     Extremity/Trunk Assessment Upper Extremity Assessment Upper Extremity Assessment: Generalized weakness  Lower Extremity Assessment Lower Extremity Assessment: Defer to PT evaluation   Cervical / Trunk Assessment Cervical / Trunk Assessment: Kyphotic   Communication Communication Communication: No apparent difficulties   Cognition Arousal: Alert Behavior During  Therapy: WFL for tasks assessed/performed Cognition: No apparent impairments                               Following commands: Intact       Cueing  General Comments   Cueing Techniques: Verbal cues  vss on RA   Exercises     Shoulder Instructions      Home Living Family/patient expects to be discharged to:: Private residence Living Arrangements: Spouse/significant other Available Help at Discharge: Family;Available 24 hours/day Type of Home: House Home Access: Ramped entrance     Home Layout: One level     Bathroom Shower/Tub: Producer, television/film/video: Handicapped height Bathroom Accessibility: Yes   Home Equipment: Agricultural consultant (2 wheels);Rollator (4 wheels);Cane - single point;Shower seat;BSC/3in1;Grab bars - toilet;Wheelchair - manual;Grab bars - tub/shower;Lift chair          Prior Functioning/Environment Prior Level of Function : Needs assist  Cognitive Assist : Mobility (cognitive)     Physical Assist : ADLs (physical)   ADLs (physical): Grooming;Bathing;Dressing;Toileting Mobility Comments: Pt reports that he was able to walk around his house with rollator. Sleeps in recliner at home. Pt reports 1 fall in 6 months reporting his legs just gave out. ADLs Comments: Spouse assisted with dressing, showering and toileting.    OT Problem List: Decreased strength;Decreased range of motion;Decreased activity tolerance;Impaired balance (sitting and/or standing);Decreased coordination;Decreased cognition;Decreased safety awareness;Cardiopulmonary status limiting activity   OT Treatment/Interventions: Self-care/ADL training;Therapeutic exercise;Energy conservation;DME and/or AE instruction;Therapeutic activities;Balance training;Patient/family education      OT Goals(Current goals can be found in the care plan section)   Acute Rehab OT Goals Patient Stated Goal: none stated OT Goal Formulation: With patient Time For Goal Achievement:  09/28/23 Potential to Achieve Goals: Good ADL Goals Pt Will Perform Upper Body Dressing: Independently;sitting Pt Will Perform Lower Body Dressing: Independently;sitting/lateral leans;sit to/from stand Pt Will Transfer to Toilet: Independently;ambulating;regular height toilet Pt Will Perform Tub/Shower Transfer: Tub transfer;Shower transfer;Independently;ambulating   OT Frequency:  Min 2X/week    Co-evaluation              AM-PAC OT "6 Clicks" Daily Activity     Outcome Measure Help from another person eating meals?: A Little Help from another person taking care of personal grooming?: A Little Help from another person toileting, which includes using toliet, bedpan, or urinal?: A Lot Help from another person bathing (including washing, rinsing, drying)?: A Lot Help from another person to put on and taking off regular upper body clothing?: A Little Help from another person to put on and taking off regular lower body clothing?: A Lot 6 Click Score: 15   End of Session Equipment Utilized During Treatment: Gait belt;Rolling walker (2 wheels) Nurse Communication: Mobility status  Activity Tolerance: Patient tolerated treatment well Patient left: with call bell/phone within reach;in chair;with chair alarm set  OT Visit Diagnosis: Other abnormalities of gait and mobility (R26.89);Unsteadiness on feet (R26.81);Muscle weakness (generalized) (M62.81)                Time: 1610-9604 OT Time Calculation (min): 17 min Charges:  OT General Charges $OT Visit: 1 Visit OT Evaluation $OT Eval Moderate Complexity: 1 Mod  Dewell Monnier K, OTD, OTR/L  [employer] [facility] Acute Rehab [phone]   Dalphine Handing 09/14/2023, 12:56 PM

## 2023-09-15 LAB — CULTURE, BLOOD (ROUTINE X 2): Culture: NO GROWTH

## 2023-09-15 NOTE — Progress Notes (Signed)
 Randall Mccormick transferred via Care link to [facility]. He is having a procedure in the Heart Cath Lab. He will not be returning to [facility] after the procedure, he will remain at [facility].

## 2023-09-16 LAB — CULTURE, BLOOD (ROUTINE X 2)
Culture: NO GROWTH
Special Requests: ADEQUATE

## 2023-09-23 ENCOUNTER — Encounter: Payer: Self-pay | Admitting: Podiatry

## 2023-09-23 ENCOUNTER — Ambulatory Visit (INDEPENDENT_AMBULATORY_CARE_PROVIDER_SITE_OTHER): Payer: MEDICARE | Admitting: Podiatry

## 2023-09-23 ENCOUNTER — Ambulatory Visit (INDEPENDENT_AMBULATORY_CARE_PROVIDER_SITE_OTHER): Payer: MEDICARE

## 2023-09-23 DIAGNOSIS — L02611 Cutaneous abscess of right foot: Secondary | ICD-10-CM

## 2023-09-23 DIAGNOSIS — L600 Ingrowing nail: Secondary | ICD-10-CM | POA: Diagnosis not present

## 2023-09-23 DIAGNOSIS — L03031 Cellulitis of right toe: Secondary | ICD-10-CM | POA: Diagnosis not present

## 2023-09-23 MED ORDER — SILVER SULFADIAZINE 1 % EX CREA
1.0000 | TOPICAL_CREAM | Freq: Every day | CUTANEOUS | 0 refills | Status: AC
Start: 2023-09-23 — End: 2023-10-13

## 2023-09-23 NOTE — Progress Notes (Unsigned)
 Chief Complaint  Patient presents with   PNA Check    Here today for check of his right great toe, where the nail was removed on 03/18. He did finish the antibiotics however, he was admitted to the hospital because he had already become septic from the infection. They went to [facility].     HPI: 68 y.o. male presents today following up for right first toe total nail avulsion.  Unfortunately day after the procedure, patient was feeling unwell, weak, noted to be hypotensive and was admitted to the hospital for worsening cellulitis.  Was found to have altered mental status and decreased urine output.  He states that he is feeling better now.  He reports that he has not been on antibiotics since leaving the hospital. Has been unable to soak his foot or do wound care to the site.  Patient is wheelchair-bound and has history of multiple spinal surgeries.  Past Medical History:  Diagnosis Date   Anxiety    Cobalamin deficiency    Degeneration of lumbar or lumbosacral intervertebral disc    Depression    Gouty arthropathy, unspecified    Hemorrhoids    Hyperlipidemia    Hypertension    Multiple fractures of ribs of left side 04/20/2013   FALL FROM A LADDER ON    Vitamin D deficiency     Past Surgical History:  Procedure Laterality Date   APPENDECTOMY     BACK SURGERY     x 6   CHOLECYSTECTOMY  06/24/2008   [facility]   ESOPHAGOGASTRODUODENOSCOPY N/A 04/06/2022   Procedure: ESOPHAGOGASTRODUODENOSCOPY (EGD);  Surgeon: Sherrilyn Rist, MD;  Location: [facility] ENDOSCOPY;  Service: Gastroenterology;  Laterality: N/A;   TONSILLECTOMY     TOTAL KNEE ARTHROPLASTY Right 03/28/2022   Procedure: RIGHT TOTAL KNEE ARTHROPLASTY;  Surgeon: Cammy Copa, MD;  Location: [facility] OR;  Service: Orthopedics;  Laterality: Right;    No Known Allergies  ROS    Physical Exam: There were no vitals filed for this visit.  General: The patient is alert and oriented x3 in no acute  distress.  Dermatology: Right first toe total nail avulsion site there is dry eschar noted with scant serous drainage the proximal aspect.  Does feel somewhat loosely adhered.  Mild redness without edema noted about the site.  Vascular: Capillary refill time is less than 3-seconds to toes bilateral. Palpable pedal pulses b/l LE. Digital hair present b/l.  Pedal edema present b/l. Skin temperature gradient WNL b/l.   Neurological: Protective sensation absent.  Light touch sensation diminished.  Musculoskeletal Exam: Minimal active muscle strength.  Spastic hammertoe contractures of digits 1 through 5 that are semireducible.  Radiographic Exam: Right foot 3 views 09/23/23 Osteoarthritis of first MTPJ noted.  Flexor contractures of all digits noted.  No obvious signs of osteolysis, cortical erosion, periosteal reaction at the first toe correlating with the site of the nail avulsion.  Assessment/Plan of Care: 1. Ingrown toenail of right foot with infection      Meds ordered this encounter  Medications   silver sulfADIAZINE (SILVADENE) 1 % cream    Sig: Apply 1 Application topically daily for 20 days.    Dispense:  20 g    Refill:  0   None  Discussed clinical findings with patient today. Discharge summary and foot radiographs from hospital reviewed.  Recommend close monitoring of the site due to the extent of the infection and due to some serous drainage associated with  the eschar.  Okay to do the Epsom salt soaks however recommend also dressing the toe with Silvadene cream.  Rx sent to patient's pharmacy.  They state that they still have the course of Bactrim which was previously prescribed.  They may take this and complete the course at this point.  Silvadene cream and Band-Aid applied today.  Monitor site closely.  Follow-up in 2 weeks.   Carry Ortez L. Marchia Bond, AACFAS [facility]     [address] [place], [place] [address]                 Office [phone]  Fax [phone]

## 2023-10-07 ENCOUNTER — Ambulatory Visit: Payer: MEDICARE | Admitting: Podiatry

## 2023-10-14 ENCOUNTER — Ambulatory Visit: Payer: MEDICARE | Admitting: Podiatry

## 2023-12-02 ENCOUNTER — Encounter: Payer: Self-pay | Admitting: Podiatry

## 2023-12-02 ENCOUNTER — Ambulatory Visit (INDEPENDENT_AMBULATORY_CARE_PROVIDER_SITE_OTHER): Payer: MEDICARE

## 2023-12-02 ENCOUNTER — Ambulatory Visit (INDEPENDENT_AMBULATORY_CARE_PROVIDER_SITE_OTHER): Payer: MEDICARE | Admitting: Podiatry

## 2023-12-02 DIAGNOSIS — G5761 Lesion of plantar nerve, right lower limb: Secondary | ICD-10-CM | POA: Diagnosis not present

## 2023-12-02 DIAGNOSIS — L97521 Non-pressure chronic ulcer of other part of left foot limited to breakdown of skin: Secondary | ICD-10-CM | POA: Diagnosis not present

## 2023-12-02 DIAGNOSIS — M2042 Other hammer toe(s) (acquired), left foot: Secondary | ICD-10-CM | POA: Diagnosis not present

## 2023-12-02 DIAGNOSIS — B351 Tinea unguium: Secondary | ICD-10-CM | POA: Diagnosis not present

## 2023-12-02 DIAGNOSIS — M2041 Other hammer toe(s) (acquired), right foot: Secondary | ICD-10-CM | POA: Diagnosis not present

## 2023-12-02 DIAGNOSIS — M79674 Pain in right toe(s): Secondary | ICD-10-CM

## 2023-12-02 DIAGNOSIS — M79675 Pain in left toe(s): Secondary | ICD-10-CM

## 2023-12-02 DIAGNOSIS — M7751 Other enthesopathy of right foot: Secondary | ICD-10-CM

## 2023-12-02 MED ORDER — TRIAMCINOLONE ACETONIDE 10 MG/ML IJ SUSP
10.0000 mg | Freq: Once | INTRAMUSCULAR | Status: AC
Start: 2023-12-02 — End: 2023-12-02
  Administered 2023-12-02: 10 mg

## 2023-12-02 NOTE — Progress Notes (Signed)
 Chief Complaint  Patient presents with   PNA check    PNA of the right Hallux, the infection he had when we did the PNA got bad enough that he was in the hospital for sepsis. He his here today to get it checked and to see about the right foot in general is causing pain. Nail removal looks great.     HPI: 68 y.o. male presents today with new primary complaint of right forefoot pain.  Patient ambulates minimally but does ambulate around the house some with a walker.  He has been complaining of worsening right forefoot pain for some time now.  He does present in a wheelchair today, does have significant neuropathy.  He did have total nail avulsion right first toe previously.  This is healed well.  The remaining nail plates are thickened, elongated, dystrophic and tender with pressure.  He does have extensive forefoot deformity bilaterally.  Past Medical History:  Diagnosis Date   Anxiety    Cobalamin deficiency    Degeneration of lumbar or lumbosacral intervertebral disc    Depression    Gouty arthropathy, unspecified    Hemorrhoids    Hyperlipidemia    Hypertension    Multiple fractures of ribs of left side 04/20/2013   FALL FROM A LADDER ON    Vitamin D deficiency     Past Surgical History:  Procedure Laterality Date   APPENDECTOMY     BACK SURGERY     x 6   CHOLECYSTECTOMY  06/24/2008   [facility]   ESOPHAGOGASTRODUODENOSCOPY N/A 04/06/2022   Procedure: ESOPHAGOGASTRODUODENOSCOPY (EGD);  Surgeon: Albertina Hugger, MD;  Location: [facility] ENDOSCOPY;  Service: Gastroenterology;  Laterality: N/A;   TONSILLECTOMY     TOTAL KNEE ARTHROPLASTY Right 03/28/2022   Procedure: RIGHT TOTAL KNEE ARTHROPLASTY;  Surgeon: Jasmine Mesi, MD;  Location: [facility] OR;  Service: Orthopedics;  Laterality: Right;    No Known Allergies  ROS    Physical Exam: There were no vitals filed for this visit.  General: The patient is alert and oriented x3 in no acute distress.  Dermatology:  Right hallux nailbed stable and well-healed.  The remaining nail plates x 9 bilaterally are thickened, elongated, yellow discoloration, dystrophic with subungual debris.  They are painful on direct dorsal pressure.  There is area of superficial ulceration to dorsal medial left hallux medial to the nail plate.  Limited to superficial skin breakdown.  Vascular: Difficult to palpate pedal pulses due to edema. Capillary refill within normal limits.  Lower extremities diffusely edematous +1 to +2 pitting edema.  Scant hair growth present.  Neurological: Light touch sensation decreased in the toes  Musculoskeletal Exam: Extensive forefoot pedal deformity noted with decreased first MPJ range of motion, hammertoe deformities of all digits including hallux malleus of the first toe.  These changes are bilateral.  Tenderness on palpation of the 2nd and 3rd interspace and on palpation of the dorsal metatarsal phalangeal joints at this level.  Radiographic Exam: Right foot 3 views 12/02/2023 Normal osseous mineralization.  Arthritic changes of the forefoot noted.  First MPJ arthritis noted.  Hammertoe deformities of all digits noted.  Interphalangeal joints diffusely appear arthritic.  Calcaneal spurring and ankle joint spurring present.  Assessment/Plan of Care: 1. Pain due to onychomycosis of toenails of both feet   2. Hammer toes of both feet   3. Morton neuroma of right foot   4. Skin ulcer of toe of left foot, limited to  breakdown of skin (HCC)      Meds ordered this encounter  Medications   triamcinolone  acetonide (KENALOG ) 10 MG/ML injection 10 mg   None  Discussed clinical findings with patient today.  # Pain due to onychomycosis of toenails - Nail plates x 9 were debrided in thickness and length using sterile nail nippers without incident. - Mechanical bur used to file the nails down thin  # New superficial ulceration to left first toe adjacent to nail plate - Sharply debrided using 312  scalpel blade of devitalized skin and soft tissue, exposed dermis noted. - Antibiotic bandage applied - Recommend daily dressing changes using antibiotic ointment and bandage  # Forefoot deformity with hammertoes and neuroma like pain 2nd and 3rd interspace - Likely aggravated by weightbearing accompanied by neurologic mediated forefoot deformity. - Does have extensive forefoot deformity as well as lumbar disc degeneration - Limited options available to provide relief to patient, did discuss corticosteroid injection, patient would like to proceed with this - Did discuss offloading of the forefoot using felt pads, this could be of limited utility due to patient's deformity when attempting to ambulate.  Pain also could be aggravated by toes jamming into the ground due to rigid hammertoe deformities.  Verbal consent obtained to administer corticosteroid injection to right second and third interspace via dorsal approach.  Alcohol skin prep.  The affected interspaces were injected with a mixture of 1 cc of a one-to-one ratio of 0.5% Marcaine  plain and 2% lidocaine  plain mixed with 10 mg Kenalog  between the 2 interspaces.  Band-Aid applied.  Patient tolerated well.  Reevaluate in 3 weeks.   Octavius Shin L. Lunda Salines, AACFAS [facility]     [address] [place], [place] [address]                Office [phone]  Fax [phone]

## 2023-12-23 ENCOUNTER — Inpatient Hospital Stay (HOSPITAL_COMMUNITY)
Admission: EM | Admit: 2023-12-23 | Discharge: 2023-12-27 | DRG: 194 | Disposition: A | Discharge status: Skilled Nursing Facility | Payer: MEDICARE | Attending: Internal Medicine | Admitting: Internal Medicine

## 2023-12-23 ENCOUNTER — Emergency Department (HOSPITAL_COMMUNITY): Payer: MEDICARE

## 2023-12-23 ENCOUNTER — Ambulatory Visit: Payer: MEDICARE | Admitting: Podiatry

## 2023-12-23 ENCOUNTER — Other Ambulatory Visit: Payer: Self-pay

## 2023-12-23 DIAGNOSIS — Z6833 Body mass index (BMI) 33.0-33.9, adult: Secondary | ICD-10-CM

## 2023-12-23 DIAGNOSIS — J189 Pneumonia, unspecified organism: Principal | ICD-10-CM | POA: Diagnosis present

## 2023-12-23 DIAGNOSIS — K5900 Constipation, unspecified: Secondary | ICD-10-CM | POA: Diagnosis present

## 2023-12-23 DIAGNOSIS — L97529 Non-pressure chronic ulcer of other part of left foot with unspecified severity: Secondary | ICD-10-CM | POA: Diagnosis present

## 2023-12-23 DIAGNOSIS — T3695XA Adverse effect of unspecified systemic antibiotic, initial encounter: Secondary | ICD-10-CM | POA: Diagnosis present

## 2023-12-23 DIAGNOSIS — F419 Anxiety disorder, unspecified: Secondary | ICD-10-CM | POA: Diagnosis present

## 2023-12-23 DIAGNOSIS — E876 Hypokalemia: Secondary | ICD-10-CM | POA: Diagnosis not present

## 2023-12-23 DIAGNOSIS — M109 Gout, unspecified: Secondary | ICD-10-CM | POA: Diagnosis present

## 2023-12-23 DIAGNOSIS — G894 Chronic pain syndrome: Secondary | ICD-10-CM | POA: Diagnosis present

## 2023-12-23 DIAGNOSIS — Z91148 Patient's other noncompliance with medication regimen for other reason: Secondary | ICD-10-CM

## 2023-12-23 DIAGNOSIS — I10 Essential (primary) hypertension: Secondary | ICD-10-CM | POA: Diagnosis present

## 2023-12-23 DIAGNOSIS — L271 Localized skin eruption due to drugs and medicaments taken internally: Secondary | ICD-10-CM | POA: Diagnosis present

## 2023-12-23 DIAGNOSIS — Z79891 Long term (current) use of opiate analgesic: Secondary | ICD-10-CM

## 2023-12-23 DIAGNOSIS — E66811 Obesity, class 1: Secondary | ICD-10-CM | POA: Diagnosis present

## 2023-12-23 DIAGNOSIS — Z87891 Personal history of nicotine dependence: Secondary | ICD-10-CM

## 2023-12-23 DIAGNOSIS — Z8249 Family history of ischemic heart disease and other diseases of the circulatory system: Secondary | ICD-10-CM

## 2023-12-23 DIAGNOSIS — E785 Hyperlipidemia, unspecified: Secondary | ICD-10-CM | POA: Diagnosis present

## 2023-12-23 DIAGNOSIS — N400 Enlarged prostate without lower urinary tract symptoms: Secondary | ICD-10-CM | POA: Diagnosis present

## 2023-12-23 DIAGNOSIS — R531 Weakness: Principal | ICD-10-CM

## 2023-12-23 DIAGNOSIS — Z79899 Other long term (current) drug therapy: Secondary | ICD-10-CM

## 2023-12-23 DIAGNOSIS — F32A Depression, unspecified: Secondary | ICD-10-CM | POA: Diagnosis present

## 2023-12-23 DIAGNOSIS — G8929 Other chronic pain: Secondary | ICD-10-CM | POA: Diagnosis present

## 2023-12-23 DIAGNOSIS — J9811 Atelectasis: Secondary | ICD-10-CM | POA: Diagnosis present

## 2023-12-23 DIAGNOSIS — Z91199 Patient's noncompliance with other medical treatment and regimen due to unspecified reason: Secondary | ICD-10-CM

## 2023-12-23 DIAGNOSIS — G4733 Obstructive sleep apnea (adult) (pediatric): Secondary | ICD-10-CM | POA: Diagnosis present

## 2023-12-23 DIAGNOSIS — Z96651 Presence of right artificial knee joint: Secondary | ICD-10-CM | POA: Diagnosis present

## 2023-12-23 DIAGNOSIS — Z993 Dependence on wheelchair: Secondary | ICD-10-CM

## 2023-12-23 DIAGNOSIS — Z7982 Long term (current) use of aspirin: Secondary | ICD-10-CM

## 2023-12-23 DIAGNOSIS — R051 Acute cough: Secondary | ICD-10-CM

## 2023-12-23 DIAGNOSIS — R5381 Other malaise: Secondary | ICD-10-CM | POA: Diagnosis present

## 2023-12-23 LAB — LACTIC ACID, PLASMA: Lactic Acid, Venous: 0.9 mmol/L (ref 0.5–1.9)

## 2023-12-23 LAB — COMPREHENSIVE METABOLIC PANEL WITH GFR
ALT: 22 U/L (ref 0–44)
AST: 27 U/L (ref 15–41)
Albumin: 3.4 g/dL — ABNORMAL LOW (ref 3.5–5.0)
Alkaline Phosphatase: 91 U/L (ref 38–126)
Anion gap: 9 (ref 5–15)
BUN: 10 mg/dL (ref 8–23)
CO2: 28 mmol/L (ref 22–32)
Calcium: 9 mg/dL (ref 8.9–10.3)
Chloride: 104 mmol/L (ref 98–111)
Creatinine, Ser: 0.95 mg/dL (ref 0.61–1.24)
GFR, Estimated: >60 mL/min (ref >60)
Glucose, Bld: 100 mg/dL — ABNORMAL HIGH (ref 70–99)
Potassium: 3.7 mmol/L (ref 3.5–5.1)
Sodium: 141 mmol/L (ref 135–145)
Total Bilirubin: 1.3 mg/dL — ABNORMAL HIGH (ref 0.0–1.2)
Total Protein: 7.1 g/dL (ref 6.5–8.1)

## 2023-12-23 LAB — CBC WITH DIFFERENTIAL/PLATELET
Abs Immature Granulocytes: 0.03 10*3/uL (ref 0.00–0.07)
Basophils Absolute: 0 10*3/uL (ref 0.0–0.1)
Basophils Relative: 0 %
Eosinophils Absolute: 0.2 10*3/uL (ref 0.0–0.5)
Eosinophils Relative: 2 %
HCT: 49.2 % (ref 39.0–52.0)
Hemoglobin: 15.6 g/dL (ref 13.0–17.0)
Immature Granulocytes: 0 %
Lymphocytes Relative: 20 %
Lymphs Abs: 1.7 10*3/uL (ref 0.7–4.0)
MCH: 30.7 pg (ref 26.0–34.0)
MCHC: 31.7 g/dL (ref 30.0–36.0)
MCV: 96.9 fL (ref 80.0–100.0)
Monocytes Absolute: 0.7 10*3/uL (ref 0.1–1.0)
Monocytes Relative: 9 %
Neutro Abs: 5.7 10*3/uL (ref 1.7–7.7)
Neutrophils Relative %: 69 %
Platelets: 216 10*3/uL (ref 150–400)
RBC: 5.08 MIL/uL (ref 4.22–5.81)
RDW: 13.8 % (ref 11.5–15.5)
WBC: 8.3 10*3/uL (ref 4.0–10.5)
nRBC: 0 % (ref 0.0–0.2)

## 2023-12-23 LAB — URINALYSIS, ROUTINE W REFLEX MICROSCOPIC
Bacteria, UA: NONE SEEN
Bilirubin Urine: NEGATIVE
Glucose, UA: NEGATIVE mg/dL
Hgb urine dipstick: NEGATIVE
Ketones, ur: NEGATIVE mg/dL
Leukocytes,Ua: NEGATIVE
Nitrite: NEGATIVE
Protein, ur: NEGATIVE mg/dL
Specific Gravity, Urine: 1.012 (ref 1.005–1.030)
pH: 6 (ref 5.0–8.0)

## 2023-12-23 LAB — LIPASE, BLOOD: Lipase: 26 U/L (ref 11–51)

## 2023-12-23 LAB — APTT: aPTT: 33 s (ref 24–36)

## 2023-12-23 LAB — RESP PANEL BY RT-PCR (RSV, FLU A&B, COVID)  RVPGX2
Influenza A by PCR: NEGATIVE
Influenza B by PCR: NEGATIVE
Resp Syncytial Virus by PCR: NEGATIVE
SARS Coronavirus 2 by RT PCR: NEGATIVE

## 2023-12-23 LAB — PROTIME-INR
INR: 1.1 (ref 0.8–1.2)
Prothrombin Time: 14.8 s (ref 11.4–15.2)

## 2023-12-23 LAB — BRAIN NATRIURETIC PEPTIDE: B Natriuretic Peptide: 8.4 pg/mL (ref 0.0–100.0)

## 2023-12-23 MED ORDER — LACTATED RINGERS IV BOLUS
1000.0000 mL | Freq: Once | INTRAVENOUS | Status: AC
  Administered 2023-12-23: 1000 mL via INTRAVENOUS

## 2023-12-23 MED ORDER — AZITHROMYCIN 250 MG PO TABS: 250.0000 mg | ORAL_TABLET | Freq: Every day | ORAL | 0 refills | Status: DC

## 2023-12-23 MED ORDER — GLYCERIN (LAXATIVE) 2 G RE SUPP
1.0000 | Freq: Once | RECTAL | Status: AC
  Administered 2023-12-23: 1 via RECTAL
  Filled 2023-12-23: qty 1

## 2023-12-23 MED ORDER — AZITHROMYCIN 250 MG PO TABS
500.0000 mg | ORAL_TABLET | Freq: Once | ORAL | Status: AC
  Administered 2023-12-23: 500 mg via ORAL
  Filled 2023-12-23: qty 2

## 2023-12-23 NOTE — ED Notes (Signed)
 Pt aware that we need a urine sample. Thinks he may be able to go after some of the fluids. Urine available and will call me if he has to go.

## 2023-12-23 NOTE — ED Notes (Signed)
 Spouse at bedside. Pt resting comfortably. Given warm blankets. Denies needs.

## 2023-12-23 NOTE — ED Triage Notes (Addendum)
 Pt reports weakness x a couple of days. Afebrile. New cough. Also reports constipation x a few days and decreased PO.   Has a procedure done on his left foot about a week ago but didn't finish ABX.  Glucose with EMS 112.

## 2023-12-23 NOTE — ED Provider Notes (Signed)
 [place] EMERGENCY DEPARTMENT AT [facility] Provider Note   CSN: [id number] Arrival date & time: 12/23/23  1803     History  Chief Complaint  Patient presents with   Weakness    Randall Mccormick is a 68 y.o. male with PMH as listed below who presents with weakness x a couple of days. Sleeping on and off for about 24 hours.  Afebrile. New cough x 1 week productive of clear mucous. Also reports constipation x a few days and decreased PO.  Denies nausea vomiting.  Denies abdominal pain.   Has a procedure done on his left foot about a week ago but didn't finish ABX.  Also noticed a rash on his leg that started after the antibiotics.  He states that his toe looks improved after the antibiotics.  Past Medical History:  Diagnosis Date   Anxiety    Cobalamin deficiency    Degeneration of lumbar or lumbosacral intervertebral disc    Depression    Gouty arthropathy, unspecified    Hemorrhoids    Hyperlipidemia    Hypertension    Multiple fractures of ribs of left side 04/20/2013   FALL FROM A LADDER ON    Vitamin D deficiency        Home Medications Prior to Admission medications   Medication Sig Start Date End Date Taking? Authorizing Provider  allopurinol  (ZYLOPRIM ) 300 MG tablet Take 300 mg by mouth daily. 09/16/21   [provider]  amLODipine  (NORVASC ) 2.5 MG tablet Take 1 tablet (2.5 mg total) by mouth daily. 09/14/23   Rosario Leatrice FERNS, MD  aspirin  81 MG chewable tablet Chew 1 tablet (81 mg total) by mouth 2 (two) times daily. 03/29/22   Addie Cordella Hamilton, MD  baclofen  5 MG TABS Take 5 mg by mouth 3 (three) times daily. Patient taking differently: Take 20 mg by mouth in the morning and at bedtime. 04/12/22   Nguyen, Quan, DO  ezetimibe  (ZETIA ) 10 MG tablet Take 10 mg by mouth at bedtime. 04/29/20   [provider]  folic acid  (FOLVITE ) 1 MG tablet Take 1 mg by mouth daily. 07/29/23   [provider]  gabapentin  (NEURONTIN ) 300 MG capsule  Take 1 capsule (300 mg total) by mouth daily. 09/14/23   Rosario Leatrice FERNS, MD  oxyCODONE  10 MG TABS Take 1 tablet (10 mg total) by mouth 3 (three) times daily as needed for severe pain (pain score 7-10) or moderate pain (pain score 4-6). 09/14/23   Rosario Leatrice FERNS, MD  pantoprazole  (PROTONIX ) 40 MG tablet  08/25/23   [provider]  polyethylene glycol (MIRALAX  / GLYCOLAX ) 17 g packet Take 17 g by mouth daily. 09/15/23   Rosario Leatrice FERNS, MD  senna-docusate (SENOKOT-S) 8.6-50 MG tablet Take 2 tablets by mouth at bedtime. 09/14/23   Rosario Leatrice FERNS, MD  tamsulosin  (FLOMAX ) 0.4 MG CAPS capsule TAKE 1 CAPSULE EVERY DAY AFTER SUPPER 10/01/22   Lovorn, Megan, MD  VENTOLIN  HFA 108 [phone] Base) MCG/ACT inhaler  08/22/23   [provider]      Allergies    Patient has no known allergies.    Review of Systems   Review of Systems A 10 point review of systems was performed and is negative unless otherwise reported in HPI.  Physical Exam Updated Vital Signs BP 138/87   Pulse 82   Temp 98.9 F (37.2 C)   Resp 20   Ht 5' 8 (1.727 m)   Wt 99.8 kg  SpO2 99%   BMI 33.45 kg/m  Physical Exam General: Feeble-appearing elderly male, lying in bed.  HEENT: PERRLA, Sclera anicteric, MMM, trachea midline.  Cardiology: RRR, no murmurs/rubs/gallops.  Resp: Normal respiratory rate and effort. CTAB, no wheezes, rhonchi, crackles.  Abd: Soft, non-tender, non-distended. No rebound tenderness or guarding.  GU: Deferred. MSK: Well-healing wound on the left great toe without any erythema, induration, or purulent drainage.  No significant tenderness palpation.  No peripheral edema or signs of trauma. Extremities without deformity or TTP. No cyanosis or clubbing. Skin: warm, dry. Purpuric rash on BL LEs, see image below.  Area of separate old ecchymosis on the left shin (pictured).  Neuro: A&Ox4, CNs II-XII grossly intact. MAEs. Sensation grossly intact.  Psych: Normal mood and affect.          ED Results / Procedures / Treatments   Labs (all labs ordered are listed, but only abnormal results are displayed) Labs Reviewed  COMPREHENSIVE METABOLIC PANEL WITH GFR - Abnormal; Notable for the following components:      Result Value   Glucose, Bld 100 (*)    Albumin 3.4 (*)    Total Bilirubin 1.3 (*)    All other components within normal limits  COMPREHENSIVE METABOLIC PANEL WITH GFR - Abnormal; Notable for the following components:   Glucose, Bld 104 (*)    Albumin 3.4 (*)    Total Bilirubin 1.7 (*)    All other components within normal limits  BLOOD GAS, VENOUS - Abnormal; Notable for the following components:   Bicarbonate 36.1 (*)    Acid-Base Excess 9.4 (*)    All other components within normal limits  RESP PANEL BY RT-PCR (RSV, FLU A&B, COVID)  RVPGX2  CULTURE, BLOOD (ROUTINE X 2)  CULTURE, BLOOD (ROUTINE X 2)  CBC WITH DIFFERENTIAL/PLATELET  LIPASE, BLOOD  URINALYSIS, ROUTINE W REFLEX MICROSCOPIC  BRAIN NATRIURETIC PEPTIDE  LACTIC ACID, PLASMA  PROTIME-INR  APTT  CBC WITH DIFFERENTIAL/PLATELET  TSH  HIV ANTIBODY (ROUTINE TESTING W REFLEX)  AMMONIA  CBC  MAGNESIUM   BASIC METABOLIC PANEL WITH GFR  MAGNESIUM   CBG MONITORING, ED    EKG EKG Interpretation Date/Time:  Tuesday December 23 2023 18:26:20 EDT Ventricular Rate:  78 PR Interval:  168 QRS Duration:  110 QT Interval:  396 QTC Calculation: 452 R Axis:   125  Text Interpretation: Sinus rhythm Ventricular premature complex Left posterior fascicular block Anterior infarct, age indeterminate Confirmed by Franklyn Gills [phone]) on 12/23/2023 6:36:57 PM  Radiology CXR: FINDINGS: Shallow inspiration and left lung base atelectasis. Pneumonia is less likely. No pleural effusion or pneumothorax. Stable cardiac silhouette. No acute osseous pathology. Spinal fusion hardware.   IMPRESSION: Shallow inspiration and left lung base atelectasis.  Procedures Procedures    Medications Ordered in  ED Medications  lactated ringers  bolus 1,000 mL (0 mLs Intravenous Stopped 12/23/23 2315)  Glycerin  (Adult) 2 g suppository 1 suppository (1 suppository Rectal Given 12/23/23 2328)  azithromycin  (ZITHROMAX ) tablet 500 mg (500 mg Oral Given 12/23/23 2328)    ED Course/ Medical Decision Making/ A&P                          Medical Decision Making Amount and/or Complexity of Data Reviewed Labs: ordered. Decision-making details documented in ED Course. Radiology: ordered. Decision-making details documented in ED Course.  Risk OTC drugs. Prescription drug management. Decision regarding hospitalization.    This patient presents to the ED for concern of generalized weakness, cough, this  involves an extensive number of treatment options, and is a complaint that carries with it a high risk of complications and morbidity.  I considered the following differential and admission for this acute, potentially life threatening condition.   MDM:    DDX for generalized weakness includes but is not limited to:  Patient with significant generalized weakness.  Negative viral panel, BNP within normal limits, no concern for heart failure.  UA negative for UTI.  Chest x-ray shows left lung base atelectasis that could possibly represent a pneumonia.  Patient does have acute cough that is productive and new symptoms and I believe this likely does represent a new pneumonia.  He does not have any signs of sepsis reassuringly.  Will treat with ceftriaxone  azithromycin .  No significant electrolyte derangements or severe metabolic derangements.  No indication of ACS.  No anemia.  Area on the left great toe without any surrounding erythema or induration, no signs of active infection there and believe that the antibiotics did improve his symptoms.  Patient does have a purpuric rash on his bilateral lower extremities reminiscent of a vasculitis.  He has normal platelets and no petechiae noted. Can follow up outpatient for this  likely. No sloughing of skin or blisters, no hives to indicate SJS/TEN or allergic reaction. No fevers/chills, no signs of sepsis to indicate TSS.   Low concern for SBO given no abdominal pain or nausea vomiting.  Patient with constipation and was given a glycerin  suppository in the ER with significant improvement.    Clinical Course as of 12/26/23 2344  Tue Dec 23, 2023  1952 DG Chest [place] 1 View Shallow inspiration and left lung base atelectasis. [HN]  2029 Resp panel by RT-PCR (RSV, Flu A&B, Covid) Anterior Nasal Swab neg [HN]  2029 B Natriuretic Peptide: 8.4 wnl [HN]  2029 Lactic Acid, Venous: 0.9 wnl [HN]  2030 Platelets: 216 wnl [HN]  2220 Urinalysis, Routine w reflex microscopic -Urine, Clean Catch Neg for infection [HN]    Clinical Course User Index [HN] Franklyn Sid SAILOR, MD    Attempted to ambulate patient and found that he could not stand.  He required significant help from 2 nurses in order to even stand from the bed.  Lives at home with his wife.  Patient with significant weakness in the setting of acute pneumonia.  Will be admitted to medicine.   Labs: I Ordered, and personally interpreted labs.  The pertinent results include: Those listed above  Imaging Studies ordered: I ordered imaging studies including chest x-ray I independently visualized and interpreted imaging. I agree with the radiologist interpretation  Additional history obtained from chart review, wife at bedside.    Cardiac Monitoring: The patient was maintained on a cardiac monitor.  I personally viewed and interpreted the cardiac monitored which showed an underlying rhythm of: Normal sinus rhythm  Reevaluation: After the interventions noted above, I reevaluated the patient and found that they have :stayed the same  Social Determinants of Health:  lives independently  Disposition: Admit to hospitalist  Co morbidities that complicate the patient evaluation  Past Medical History:  Diagnosis  Date   Anxiety    Cobalamin deficiency    Degeneration of lumbar or lumbosacral intervertebral disc    Depression    Gouty arthropathy, unspecified    Hemorrhoids    Hyperlipidemia    Hypertension    Multiple fractures of ribs of left side 04/20/2013   FALL FROM A LADDER ON    Vitamin D deficiency  Medicines No orders of the defined types were placed in this encounter.   I have reviewed the patients home medicines and have made adjustments as needed  Problem List / ED Course: Problem List Items Addressed This Visit   None Visit Diagnoses       Generalized weakness    -  Primary     Acute cough                       This note was created using dictation software, which may contain spelling or grammatical errors.    Franklyn Sid SAILOR, MD 12/26/23 [phone]

## 2023-12-23 NOTE — Discharge Instructions (Addendum)
 Thank you for coming to [facility] Emergency Department. You were seen for cough and generalized weakness. We did an exam, labs, and imaging, and these showed possibly a pneumonia. Will treat with azithromycin for 5 days. Please follow up with your primary care provider within 1 week.   Do not hesitate to return to the ED or call 911 if you experience: -Worsening symptoms -Lightheadedness, passing out -Fevers/chills -Anything else that concerns you

## 2023-12-24 ENCOUNTER — Encounter (HOSPITAL_COMMUNITY): Payer: Self-pay | Admitting: Internal Medicine

## 2023-12-24 ENCOUNTER — Observation Stay (HOSPITAL_COMMUNITY): Payer: MEDICARE

## 2023-12-24 DIAGNOSIS — Z7982 Long term (current) use of aspirin: Secondary | ICD-10-CM | POA: Diagnosis not present

## 2023-12-24 DIAGNOSIS — G894 Chronic pain syndrome: Secondary | ICD-10-CM | POA: Diagnosis present

## 2023-12-24 DIAGNOSIS — E876 Hypokalemia: Secondary | ICD-10-CM | POA: Diagnosis not present

## 2023-12-24 DIAGNOSIS — L97529 Non-pressure chronic ulcer of other part of left foot with unspecified severity: Secondary | ICD-10-CM | POA: Diagnosis present

## 2023-12-24 DIAGNOSIS — K5901 Slow transit constipation: Secondary | ICD-10-CM | POA: Diagnosis not present

## 2023-12-24 DIAGNOSIS — G8929 Other chronic pain: Secondary | ICD-10-CM

## 2023-12-24 DIAGNOSIS — F419 Anxiety disorder, unspecified: Secondary | ICD-10-CM | POA: Diagnosis present

## 2023-12-24 DIAGNOSIS — J189 Pneumonia, unspecified organism: Secondary | ICD-10-CM | POA: Insufficient documentation

## 2023-12-24 DIAGNOSIS — R531 Weakness: Secondary | ICD-10-CM

## 2023-12-24 DIAGNOSIS — L271 Localized skin eruption due to drugs and medicaments taken internally: Secondary | ICD-10-CM | POA: Diagnosis present

## 2023-12-24 DIAGNOSIS — T3695XA Adverse effect of unspecified systemic antibiotic, initial encounter: Secondary | ICD-10-CM | POA: Diagnosis present

## 2023-12-24 DIAGNOSIS — J9811 Atelectasis: Secondary | ICD-10-CM | POA: Diagnosis present

## 2023-12-24 DIAGNOSIS — E785 Hyperlipidemia, unspecified: Secondary | ICD-10-CM | POA: Diagnosis present

## 2023-12-24 DIAGNOSIS — Z91199 Patient's noncompliance with other medical treatment and regimen due to unspecified reason: Secondary | ICD-10-CM | POA: Diagnosis not present

## 2023-12-24 DIAGNOSIS — K5900 Constipation, unspecified: Secondary | ICD-10-CM | POA: Diagnosis present

## 2023-12-24 DIAGNOSIS — M109 Gout, unspecified: Secondary | ICD-10-CM | POA: Diagnosis present

## 2023-12-24 DIAGNOSIS — R5381 Other malaise: Secondary | ICD-10-CM | POA: Diagnosis present

## 2023-12-24 DIAGNOSIS — N400 Enlarged prostate without lower urinary tract symptoms: Secondary | ICD-10-CM | POA: Diagnosis present

## 2023-12-24 DIAGNOSIS — Z91148 Patient's other noncompliance with medication regimen for other reason: Secondary | ICD-10-CM | POA: Diagnosis not present

## 2023-12-24 DIAGNOSIS — F32A Depression, unspecified: Secondary | ICD-10-CM | POA: Diagnosis present

## 2023-12-24 DIAGNOSIS — Z79899 Other long term (current) drug therapy: Secondary | ICD-10-CM | POA: Diagnosis not present

## 2023-12-24 DIAGNOSIS — Z6833 Body mass index (BMI) 33.0-33.9, adult: Secondary | ICD-10-CM | POA: Diagnosis not present

## 2023-12-24 DIAGNOSIS — I10 Essential (primary) hypertension: Secondary | ICD-10-CM | POA: Diagnosis present

## 2023-12-24 DIAGNOSIS — E66811 Obesity, class 1: Secondary | ICD-10-CM | POA: Diagnosis present

## 2023-12-24 DIAGNOSIS — Z79891 Long term (current) use of opiate analgesic: Secondary | ICD-10-CM | POA: Diagnosis not present

## 2023-12-24 DIAGNOSIS — G4733 Obstructive sleep apnea (adult) (pediatric): Secondary | ICD-10-CM | POA: Diagnosis present

## 2023-12-24 DIAGNOSIS — Z87891 Personal history of nicotine dependence: Secondary | ICD-10-CM | POA: Diagnosis not present

## 2023-12-24 HISTORY — DX: Other chronic pain: G89.29

## 2023-12-24 HISTORY — DX: Pneumonia, unspecified organism: J18.9

## 2023-12-24 HISTORY — DX: Weakness: R53.1

## 2023-12-24 LAB — CBC WITH DIFFERENTIAL/PLATELET
Abs Immature Granulocytes: 0.03 10*3/uL (ref 0.00–0.07)
Basophils Absolute: 0 10*3/uL (ref 0.0–0.1)
Basophils Relative: 0 %
Eosinophils Absolute: 0.2 10*3/uL (ref 0.0–0.5)
Eosinophils Relative: 2 %
HCT: 51 % (ref 39.0–52.0)
Hemoglobin: 16.3 g/dL (ref 13.0–17.0)
Immature Granulocytes: 0 %
Lymphocytes Relative: 19 %
Lymphs Abs: 1.7 10*3/uL (ref 0.7–4.0)
MCH: 30.8 pg (ref 26.0–34.0)
MCHC: 32 g/dL (ref 30.0–36.0)
MCV: 96.2 fL (ref 80.0–100.0)
Monocytes Absolute: 0.7 10*3/uL (ref 0.1–1.0)
Monocytes Relative: 8 %
Neutro Abs: 6.2 10*3/uL (ref 1.7–7.7)
Neutrophils Relative %: 71 %
Platelets: 226 10*3/uL (ref 150–400)
RBC: 5.3 MIL/uL (ref 4.22–5.81)
RDW: 13.7 % (ref 11.5–15.5)
WBC: 8.8 10*3/uL (ref 4.0–10.5)
nRBC: 0 % (ref 0.0–0.2)

## 2023-12-24 LAB — COMPREHENSIVE METABOLIC PANEL WITH GFR
ALT: 20 U/L (ref 0–44)
AST: 27 U/L (ref 15–41)
Albumin: 3.4 g/dL — ABNORMAL LOW (ref 3.5–5.0)
Alkaline Phosphatase: 92 U/L (ref 38–126)
Anion gap: 12 (ref 5–15)
BUN: 8 mg/dL (ref 8–23)
CO2: 25 mmol/L (ref 22–32)
Calcium: 9.2 mg/dL (ref 8.9–10.3)
Chloride: 104 mmol/L (ref 98–111)
Creatinine, Ser: 0.85 mg/dL (ref 0.61–1.24)
GFR, Estimated: >60 mL/min (ref >60)
Glucose, Bld: 104 mg/dL — ABNORMAL HIGH (ref 70–99)
Potassium: 3.7 mmol/L (ref 3.5–5.1)
Sodium: 141 mmol/L (ref 135–145)
Total Bilirubin: 1.7 mg/dL — ABNORMAL HIGH (ref 0.0–1.2)
Total Protein: 7.2 g/dL (ref 6.5–8.1)

## 2023-12-24 LAB — BLOOD GAS, VENOUS
Acid-Base Excess: 9.4 mmol/L — ABNORMAL HIGH (ref 0.0–2.0)
Bicarbonate: 36.1 mmol/L — ABNORMAL HIGH (ref 20.0–28.0)
Drawn by: 3489
O2 Saturation: 57.2 %
Patient temperature: 37
pCO2, Ven: 57 mmHg (ref 44–60)
pH, Ven: 7.41 (ref 7.25–7.43)
pO2, Ven: 32 mmHg (ref 32–45)

## 2023-12-24 LAB — AMMONIA: Ammonia: 23 umol/L (ref 9–35)

## 2023-12-24 LAB — TSH: TSH: 0.894 u[IU]/mL (ref 0.350–4.500)

## 2023-12-24 LAB — HIV ANTIBODY (ROUTINE TESTING W REFLEX): HIV Screen 4th Generation wRfx: NONREACTIVE

## 2023-12-24 MED ORDER — ENOXAPARIN SODIUM 40 MG/0.4ML IJ SOSY
40.0000 mg | PREFILLED_SYRINGE | INTRAMUSCULAR | Status: DC
Start: 2023-12-24 — End: 2023-12-26
  Administered 2023-12-24 – 2023-12-26 (×3): 40 mg via SUBCUTANEOUS
  Filled 2023-12-24 (×3): qty 0.4

## 2023-12-24 MED ORDER — SENNOSIDES-DOCUSATE SODIUM 8.6-50 MG PO TABS: 2.0000 | ORAL_TABLET | Freq: Every day | ORAL | Status: DC

## 2023-12-24 MED ORDER — FLUOXETINE HCL 20 MG PO CAPS
40.0000 mg | ORAL_CAPSULE | Freq: Every day | ORAL | Status: DC
  Administered 2023-12-24 – 2023-12-27 (×4): 40 mg via ORAL
  Filled 2023-12-24 (×4): qty 2

## 2023-12-24 MED ORDER — POLYETHYLENE GLYCOL 3350 17 G PO PACK
17.0000 g | PACK | Freq: Every day | ORAL | Status: DC
  Administered 2023-12-24 – 2023-12-26 (×2): 17 g via ORAL
  Filled 2023-12-24 (×2): qty 1

## 2023-12-24 MED ORDER — ALLOPURINOL 300 MG PO TABS
300.0000 mg | ORAL_TABLET | Freq: Every day | ORAL | Status: DC
  Administered 2023-12-24 – 2023-12-27 (×4): 300 mg via ORAL
  Filled 2023-12-24: qty 3
  Filled 2023-12-24 (×3): qty 1

## 2023-12-24 MED ORDER — SENNOSIDES-DOCUSATE SODIUM 8.6-50 MG PO TABS
2.0000 | ORAL_TABLET | Freq: Two times a day (BID) | ORAL | Status: DC
  Administered 2023-12-24 – 2023-12-27 (×5): 2 via ORAL
  Filled 2023-12-24 (×5): qty 2

## 2023-12-24 MED ORDER — ASPIRIN 81 MG PO CHEW
81.0000 mg | CHEWABLE_TABLET | Freq: Every day | ORAL | Status: DC
  Administered 2023-12-24 – 2023-12-27 (×4): 81 mg via ORAL
  Filled 2023-12-24 (×4): qty 1

## 2023-12-24 MED ORDER — EZETIMIBE 10 MG PO TABS
10.0000 mg | ORAL_TABLET | Freq: Every day | ORAL | Status: DC
  Administered 2023-12-24 – 2023-12-26 (×3): 10 mg via ORAL
  Filled 2023-12-24 (×3): qty 1

## 2023-12-24 MED ORDER — ALPRAZOLAM 0.5 MG PO TABS
0.5000 mg | ORAL_TABLET | Freq: Two times a day (BID) | ORAL | Status: DC | PRN
  Filled 2023-12-24: qty 1

## 2023-12-24 MED ORDER — LACTATED RINGERS IV SOLN: INTRAVENOUS | Status: AC

## 2023-12-24 MED ORDER — AMLODIPINE BESYLATE 5 MG PO TABS
2.5000 mg | ORAL_TABLET | Freq: Every day | ORAL | Status: DC
  Administered 2023-12-24 – 2023-12-27 (×4): 2.5 mg via ORAL
  Filled 2023-12-24 (×4): qty 1

## 2023-12-24 MED ORDER — GABAPENTIN 300 MG PO CAPS
300.0000 mg | ORAL_CAPSULE | Freq: Every day | ORAL | Status: DC
  Administered 2023-12-24 – 2023-12-25 (×2): 300 mg via ORAL
  Filled 2023-12-24 (×2): qty 1

## 2023-12-24 MED ORDER — SODIUM CHLORIDE 0.9 % IV SOLN
2.0000 g | Freq: Every day | INTRAVENOUS | Status: DC
  Administered 2023-12-24 – 2023-12-26 (×3): 2 g via INTRAVENOUS
  Filled 2023-12-24 (×3): qty 20

## 2023-12-24 MED ORDER — TAMSULOSIN HCL 0.4 MG PO CAPS
0.4000 mg | ORAL_CAPSULE | Freq: Every day | ORAL | Status: DC
  Administered 2023-12-24 – 2023-12-26 (×3): 0.4 mg via ORAL
  Filled 2023-12-24 (×3): qty 1

## 2023-12-24 MED ORDER — GUAIFENESIN-DM 100-10 MG/5ML PO SYRP
5.0000 mL | ORAL_SOLUTION | ORAL | Status: DC | PRN
  Administered 2023-12-24: 5 mL via ORAL
  Filled 2023-12-24: qty 5

## 2023-12-24 MED ORDER — SODIUM CHLORIDE 0.9 % IV SOLN
500.0000 mg | INTRAVENOUS | Status: DC
  Administered 2023-12-24: 500 mg via INTRAVENOUS
  Filled 2023-12-24: qty 5

## 2023-12-24 MED ORDER — BACLOFEN 10 MG PO TABS
5.0000 mg | ORAL_TABLET | Freq: Three times a day (TID) | ORAL | Status: DC
  Administered 2023-12-24 – 2023-12-27 (×10): 5 mg via ORAL
  Filled 2023-12-24 (×10): qty 1

## 2023-12-24 MED ORDER — ALBUTEROL SULFATE (2.5 MG/3ML) 0.083% IN NEBU: 3.0000 mL | INHALATION_SOLUTION | RESPIRATORY_TRACT | Status: DC | PRN

## 2023-12-24 NOTE — ED Notes (Signed)
PT in room

## 2023-12-24 NOTE — ED Provider Notes (Signed)
 Case is discussed with Dr. Franky of [facility].  Patient with productive cough and extreme weakness send x-ray showing probable left lower lobe pneumonia per my interpretation (radiologist felt that it was atelectasis, but in the setting of productive cough it is felt that this is actually pneumonia). Dr. Franky agrees to evaluate the patient for possible admission.   Raford Lenis, MD 12/24/23 [name]

## 2023-12-24 NOTE — ED Notes (Signed)
 Attempted to ambulate patient but unsuccessful. Extremely weak and required a multiple staff assist to get back in bed.

## 2023-12-24 NOTE — ED Notes (Addendum)
 Patient had a small bowel movement. No pain at this time.

## 2023-12-24 NOTE — Progress Notes (Signed)
 Physical Therapy Evaluation Patient Details Name: Randall Mccormick MRN: [id number] DOB: 01-Jan-1956 Today's Date: 12/24/2023  History of Present Illness  Randall Mccormick is a 68 y.o. male who presented to the ED 12/23/23 for progressive weakness and inability to ambulate. PMHx: HTN, BPH, gout, history of multiple spine surgeries, and recent (~2 wks ago) great toe infection.   Clinical Impression  Pt admitted with above diagnosis. PTA, pt was modI for functional mobility using rollator and received assistance with ADLs/IADLs. He lives with his wife in a one story house with a ramped entrance. Pt has strong family support with 24/7 assistance available. Pt currently with functional limitations due to the deficits listed below (see PT Problem List). He required modA for bed mobility and transfers using RW, but quickly fatigued requiring +2 assist. Pt demonstrated poor seated/standing static and dynamic balance displaying a right lateral lean throughout requiring modA to stabilize. Pt will benefit from acute skilled PT to increase his independence and safety with mobility to allow discharge. Recommend HHPT to increase strength, improve balance, decrease fall risk, and optimize safety within the home environment.      If plan is discharge home, recommend the following: A lot of help with walking and/or transfers;A lot of help with bathing/dressing/bathroom;Assistance with cooking/housework;Assist for transportation;Help with stairs or ramp for entrance   Can travel by private vehicle        Equipment Recommendations None recommended by PT  Recommendations for Other Services       Functional Status Assessment Patient has had a recent decline in their functional status and demonstrates the ability to make significant improvements in function in a reasonable and predictable amount of time.     Precautions / Restrictions Precautions Precautions: Fall Recall of Precautions/Restrictions:  Intact Restrictions Weight Bearing Restrictions Per Provider Order: No      Mobility  Bed Mobility Overal bed mobility: Needs Assistance Bed Mobility: Supine to Sit, Sit to Supine     Supine to sit: HOB elevated, Mod assist Sit to supine: Max assist, +2 for physical assistance   General bed mobility comments: Pt sat up on Rt side of stretcher with assist to bring BLE off EOB and elevate trunk. He was able to square up hips. Pt demonstrated a right lateral lean consistently with modA to correct and stabilize. He fatigued during session, RN called into room for assist. Brought bed up to pt, scooted hips posterior until supported, and PT managed trunk while RN brought BLE back into bed. Repositioned using bed pad.    Transfers Overall transfer level: Needs assistance Equipment used: Rolling walker (2 wheels) Transfers: Sit to/from Stand Sit to Stand: Mod assist           General transfer comment: Cued proper hand placement using RW. Pt stood from [employer] and chair with modA to power up. Increased time to reach upright posture. Pt demonstrated a right lateral lean and as he fatigued increased fwd lean and B knee flex. Good eccentric control with sitting.    Ambulation/Gait Ambulation/Gait assistance: Min assist, Mod assist Gait Distance (Feet): 3 Feet Assistive device: Rolling walker (2 wheels) Gait Pattern/deviations: Step-to pattern, Decreased stride length, Trunk flexed       General Gait Details: Pt took short slow steps towards the wall. He maintained a right lateral lean requiring min-modA to stabilize and center pt within RW. Pt quickly fatigued requiring RN to be called into room and chair to be placed underneath him to rest.  Stairs  Wheelchair Mobility     Tilt Bed    Modified Rankin (Stroke Patients Only)       Balance Overall balance assessment: Needs assistance Sitting-balance support: Feet unsupported, No upper extremity supported,  Bilateral upper extremity supported Sitting balance-Leahy Scale: Poor Sitting balance - Comments: Pt required modA to correct right lateral lean and stabilize himself. Postural control: Right lateral lean Standing balance support: Bilateral upper extremity supported, During functional activity, Reliant on assistive device for balance Standing balance-Leahy Scale: Poor Standing balance comment: Pt dependent on RW and required modA to stabilize himself.                             Pertinent Vitals/Pain Pain Assessment Pain Assessment: No/denies pain    Home Living Family/patient expects to be discharged to:: Private residence Living Arrangements: Spouse/significant other Available Help at Discharge: Family;Available 24 hours/day Type of Home: House Home Access: Ramped entrance       Home Layout: One level Home Equipment: Agricultural consultant (2 wheels);Rollator (4 wheels);Cane - single point;Shower seat;BSC/3in1;Grab bars - toilet;Wheelchair - manual;Grab bars - tub/shower;Lift chair      Prior Function Prior Level of Function : Needs assist  Cognitive Assist : Mobility (cognitive) Mobility (Cognitive): Set up cues   Physical Assist : ADLs (physical)   ADLs (physical): Grooming;Bathing;Dressing;Toileting;IADLs Mobility Comments: ModI using rollator. Typically sleeps in recliner at home. 2 falls in the past 1mo. ADLs Comments: Spouse assists with ADLs and manages IADLs.     Extremity/Trunk Assessment   Upper Extremity Assessment Upper Extremity Assessment: Generalized weakness;Right hand dominant    Lower Extremity Assessment Lower Extremity Assessment: Generalized weakness    Cervical / Trunk Assessment Cervical / Trunk Assessment: Kyphotic  Communication   Communication Communication: No apparent difficulties    Cognition Arousal: Alert Behavior During Therapy: WFL for tasks assessed/performed   PT - Cognitive impairments: No family/caregiver present to  determine baseline, No apparent impairments                       PT - Cognition Comments: Pt A,Ox4 Following commands: Intact       Cueing Cueing Techniques: Verbal cues     General Comments General comments (skin integrity, edema, etc.): VSS on RA.    Exercises     Assessment/Plan    PT Assessment Patient needs continued PT services  PT Problem List Decreased strength;Decreased activity tolerance;Decreased balance;Decreased mobility;Decreased safety awareness       PT Treatment Interventions DME instruction;Gait training;Functional mobility training;Therapeutic activities;Therapeutic exercise;Balance training;Patient/family education    PT Goals (Current goals can be found in the Care Plan section)  Acute Rehab PT Goals Patient Stated Goal: Return Home and get stronger PT Goal Formulation: With patient Time For Goal Achievement: 01/07/24 Potential to Achieve Goals: Good    Frequency Min 2X/week     Co-evaluation               AM-PAC PT 6 Clicks Mobility  Outcome Measure Help needed turning from your back to your side while in a flat bed without using bedrails?: A Little Help needed moving from lying on your back to sitting on the side of a flat bed without using bedrails?: A Lot Help needed moving to and from a bed to a chair (including a wheelchair)?: A Lot Help needed standing up from a chair using your arms (e.g., wheelchair or bedside chair)?: A Lot Help needed to walk in  hospital room?: A Lot Help needed climbing 3-5 steps with a railing? : Total 6 Click Score: 12    End of Session Equipment Utilized During Treatment: Gait belt Activity Tolerance: Patient limited by fatigue Patient left: in bed;with call bell/phone within reach Nurse Communication: Mobility status PT Visit Diagnosis: Muscle weakness (generalized) (M62.81);Difficulty in walking, not elsewhere classified (R26.2);Unsteadiness on feet (R26.81);Other abnormalities of gait and  mobility (R26.89)    Time: 9175-9151 PT Time Calculation (min) (ACUTE ONLY): 24 min   Charges:   PT Evaluation $PT Eval Moderate Complexity: 1 Mod   PT General Charges $$ ACUTE PT VISIT: 1 Visit         Randall SAUNDERS, PT, DPT Acute Rehabilitation Services Office: [phone] Secure Chat Preferred  Delon CHRISTELLA Callander 12/24/2023, 9:26 AM

## 2023-12-24 NOTE — H&P (Addendum)
 History and Physical    Randall Mccormick FMW:[id number] DOB: June 15, 1956 DOA: 12/23/2023  Patient coming from: Home.  Chief Complaint: Weakness.  HPI: Randall Mccormick is a 68 y.o. male with history of hypertension, BPH, gout with history of multiple spine surgery usually ambulates with the help of a walker was recently treated for left lower extremity greater toe infection about 2 weeks ago where he took the antibiotics he developed small rash on the left lower extremity and stopped taking it.  Following which he became more weaker and was not ambulating and has not eaten well for the last 2 days.  Not had any bowel movements for the last 1 week.  Patient's family brought him to the ER because of the weakness and not ambulating.  Patient also was noticed to have increased productive cough over the last few days.  Patient's daughter who provided most of the history states that patient had a recent sleep study and was advised to be on CPAP but patient does not want to be on.  ED Course: In the ER patient was generally weak appearing and has been having persistent productive cough.  Labs are largely unremarkable.  Chest x-ray shows atelectasis.  Given the productive cough and chest x-ray showing possible atelectasis empirically started antibiotics for possible pneumonia.  Patient admitted for further observation.  Review of Systems: As per HPI, rest all negative.   Past Medical History:  Diagnosis Date   Anxiety    Cobalamin deficiency    Degeneration of lumbar or lumbosacral intervertebral disc    Depression    Gouty arthropathy, unspecified    Hemorrhoids    Hyperlipidemia    Hypertension    Multiple fractures of ribs of left side 04/20/2013   FALL FROM A LADDER ON    Vitamin D deficiency     Past Surgical History:  Procedure Laterality Date   APPENDECTOMY     BACK SURGERY     x 6   CHOLECYSTECTOMY  06/24/2008   [facility]   ESOPHAGOGASTRODUODENOSCOPY N/A 04/06/2022    Procedure: ESOPHAGOGASTRODUODENOSCOPY (EGD);  Surgeon: Legrand Victory LITTIE DOUGLAS, MD;  Location: [facility] ENDOSCOPY;  Service: Gastroenterology;  Laterality: N/A;   TONSILLECTOMY     TOTAL KNEE ARTHROPLASTY Right 03/28/2022   Procedure: RIGHT TOTAL KNEE ARTHROPLASTY;  Surgeon: Addie Cordella Hamilton, MD;  [facility];  Service: Orthopedics;  Laterality: Right;     reports that he has never smoked. He quit smokeless tobacco use about 3 years ago.  His smokeless tobacco use included chew. He reports current alcohol use of about 1.0 standard drink of alcohol per week. He reports that he does not currently use drugs.  No Known Allergies  Family History  Problem Relation Age of Onset   Cancer Mother        BREAST   Heart disease Father    Alcoholism Father    Cancer Son        LEUKEMIA    Prior to Admission medications   Medication Sig Start Date End Date Taking? Authorizing Provider  azithromycin (ZITHROMAX) 250 MG tablet Take 1 tablet (250 mg total) by mouth daily. Take first 2 tablets together, then 1 every day until finished. 12/23/23  Yes Franklyn Sid SAILOR, MD  allopurinol  (ZYLOPRIM ) 300 MG tablet Take 300 mg by mouth daily. 09/16/21   [provider]  amLODipine  (NORVASC ) 2.5 MG tablet Take 1 tablet (2.5 mg total) by mouth daily. 09/14/23   Rosario Leatrice FERNS, MD  aspirin  81 MG  chewable tablet Chew 1 tablet (81 mg total) by mouth 2 (two) times daily. 03/29/22   Addie Cordella Hamilton, MD  baclofen  5 MG TABS Take 5 mg by mouth 3 (three) times daily. Patient taking differently: Take 20 mg by mouth in the morning and at bedtime. 04/12/22   Nguyen, Quan, DO  ezetimibe  (ZETIA ) 10 MG tablet Take 10 mg by mouth at bedtime. 04/29/20   [provider]  folic acid  (FOLVITE ) 1 MG tablet Take 1 mg by mouth daily. 07/29/23   [provider]  gabapentin  (NEURONTIN ) 300 MG capsule Take 1 capsule (300 mg total) by mouth daily. 09/14/23   Rosario Leatrice FERNS, MD  oxyCODONE  10 MG TABS Take 1 tablet  (10 mg total) by mouth 3 (three) times daily as needed for severe pain (pain score 7-10) or moderate pain (pain score 4-6). 09/14/23   Rosario Leatrice FERNS, MD  pantoprazole  (PROTONIX ) 40 MG tablet  08/25/23   [provider]  polyethylene glycol (MIRALAX  / GLYCOLAX ) 17 g packet Take 17 g by mouth daily. 09/15/23   Rosario Leatrice FERNS, MD  senna-docusate (SENOKOT-S) 8.6-50 MG tablet Take 2 tablets by mouth at bedtime. 09/14/23   Rosario Leatrice FERNS, MD  tamsulosin  (FLOMAX ) 0.4 MG CAPS capsule TAKE 1 CAPSULE EVERY DAY AFTER SUPPER 10/01/22   Cornelio Bouchard, MD  VENTOLIN  HFA 108 [phone] Base) MCG/ACT inhaler  08/22/23   [provider]    Physical Exam: Constitutional: Moderately built and nourished. Vitals:   12/24/23 0100 12/24/23 0230 12/24/23 0315 12/24/23 0400  BP: 131/76 136/88 131/78 131/82  Pulse: 88 86 86 87  Resp: 18 18 (!) 23 (!) 23  Temp:      SpO2: 93% 95% 91% 90%  Weight:      Height:       Eyes: Anicteric no pallor. ENMT: No discharge from the ears/nose or mouth. Neck: No mass felt.  No neck rigidity. Respiratory: No rhonchi or crepitations. Cardiovascular: S1-S2 heard. Abdomen: Soft nontender bowel sounds present. Musculoskeletal: No edema. Skin: Left great toe appears slightly erythematous. Neurologic: Patient is sleepy but easily arousable and follows questions and moves extremities. Psychiatric: Sleepy easily arousable.   Labs on Admission: I have personally reviewed following labs and imaging studies  CBC: Recent Labs  Lab 12/23/23 1844  WBC 8.3  NEUTROABS 5.7  HGB 15.6  HCT 49.2  MCV 96.9  PLT 216   Basic Metabolic Panel: Recent Labs  Lab 12/23/23 1844  NA 141  K 3.7  CL 104  CO2 28  GLUCOSE 100*  BUN 10  CREATININE 0.95  CALCIUM  9.0   GFR: Estimated Creatinine Clearance: 85.3 mL/min (by C-G formula based on SCr of 0.95 mg/dL). Liver Function Tests: Recent Labs  Lab 12/23/23 1844  AST 27  ALT 22  ALKPHOS 91  BILITOT 1.3*  PROT  7.1  ALBUMIN 3.4*   Recent Labs  Lab 12/23/23 1844  LIPASE 26   No results for input(s): AMMONIA in the last 168 hours. Coagulation Profile: Recent Labs  Lab 12/23/23 2039  INR 1.1   Cardiac Enzymes: No results for input(s): CKTOTAL, CKMB, CKMBINDEX, TROPONINI in the last 168 hours. BNP (last 3 results) No results for input(s): PROBNP in the last 8760 hours. HbA1C: No results for input(s): HGBA1C in the last 72 hours. CBG: No results for input(s): GLUCAP in the last 168 hours. Lipid Profile: No results for input(s): CHOL, HDL, LDLCALC, TRIG, CHOLHDL, LDLDIRECT in the last 72 hours. Thyroid Function Tests: No  results for input(s): TSH, T4TOTAL, FREET4, T3FREE, THYROIDAB in the last 72 hours. Anemia Panel: No results for input(s): VITAMINB12, FOLATE, FERRITIN, TIBC, IRON, RETICCTPCT in the last 72 hours. Urine analysis:    Component Value Date/Time   COLORURINE YELLOW 12/23/2023 2133   APPEARANCEUR CLEAR 12/23/2023 2133   LABSPEC 1.012 12/23/2023 2133   PHURINE 6.0 12/23/2023 2133   GLUCOSEU NEGATIVE 12/23/2023 2133   HGBUR NEGATIVE 12/23/2023 2133   BILIRUBINUR NEGATIVE 12/23/2023 2133   KETONESUR NEGATIVE 12/23/2023 2133   PROTEINUR NEGATIVE 12/23/2023 2133   NITRITE NEGATIVE 12/23/2023 2133   LEUKOCYTESUR NEGATIVE 12/23/2023 2133   Sepsis Labs: @LABRCNTIP (procalcitonin:4,lacticidven:4) ) Recent Results (from the past 240 hours)  Resp panel by RT-PCR (RSV, Flu A&B, Covid) Anterior Nasal Swab     Status: None   Collection Time: 12/23/23  6:21 PM   Specimen: Anterior Nasal Swab  Result Value Ref Range Status   SARS Coronavirus 2 by RT PCR NEGATIVE NEGATIVE Final   Influenza A by PCR NEGATIVE NEGATIVE Final   Influenza B by PCR NEGATIVE NEGATIVE Final    Comment: (NOTE) The Xpert Xpress SARS-CoV-2/FLU/RSV plus assay is intended as an aid in the diagnosis of influenza from Nasopharyngeal swab specimens and should  not be used as a sole basis for treatment. Nasal washings and aspirates are unacceptable for Xpert Xpress SARS-CoV-2/FLU/RSV testing.  Fact Sheet for Patients: BloggerCourse.com  Fact Sheet for Healthcare Providers: SeriousBroker.it  This test is not yet approved or cleared by the [place]  FDA and has been authorized for detection and/or diagnosis of SARS-CoV-2 by FDA under an Emergency Use Authorization (EUA). This EUA will remain in effect (meaning this test can be used) for the duration of the COVID-19 declaration under Section 564(b)(1) of the Act, 21 U.S.C. section 360bbb-3(b)(1), unless the authorization is terminated or revoked.     Resp Syncytial Virus by PCR NEGATIVE NEGATIVE Final    Comment: (NOTE) Fact Sheet for Patients: BloggerCourse.com  Fact Sheet for Healthcare Providers: SeriousBroker.it  This test is not yet approved or cleared by the [place]  FDA and has been authorized for detection and/or diagnosis of SARS-CoV-2 by FDA under an Emergency Use Authorization (EUA). This EUA will remain in effect (meaning this test can be used) for the duration of the COVID-19 declaration under Section 564(b)(1) of the Act, 21 U.S.C. section 360bbb-3(b)(1), unless the authorization is terminated or revoked.  Performed at [facility], [address], [place], [place] [address]      Radiological Exams on Admission: DG Chest Port 1 View Result Date: 12/23/2023 CLINICAL DATA:  Cough and weakness. EXAM: PORTABLE CHEST 1 VIEW COMPARISON:  Chest radiograph dated 10/05/2023. FINDINGS: Shallow inspiration and left lung base atelectasis. Pneumonia is less likely. No pleural effusion or pneumothorax. Stable cardiac silhouette. No acute osseous pathology. Spinal fusion hardware. IMPRESSION: Shallow inspiration and left lung base atelectasis. Electronically Signed   By:  Vanetta Chou M.D.   On: 12/23/2023 19:30     Assessment/Plan Principal Problem:   Weakness Active Problems:   Hypertension   Chronic pain syndrome   Pneumonia    Generalized weakness likely from deconditioning. -    Will check VBG, CT head, TSH Ammonia.  Get physical therapy consult.  Gently hydrate. Check Xray foot. Possible pneumonia -    patient has been having persistent productive cough in the ER and as per the family has been so for last few days in the house.  Empirically started on antibiotics.  Will get  speech therapy consult. Constipation with prior history of recurrent impaction will get KUB and order soapsuds enema. Hypertension on amlodipine . Hyperlipidemia on Zetia . Gout on allopurinol . BPH on Flomax . Sleep apnea -   per patient's daughter patient was recently diagnosed with sleep apnea but patient did not want to wear the CPAP.  Will get VBG. Chronic pain syndrome with multiple prior spine surgery on oxycodone . Hold Oxycodone  until we get VBG results.  Since patient has generalized weakness with possible pneumonia closely monitoring and further workup and more than 2 midnight stay.   DVT prophylaxis: Lovenox . Code Status: Full code. Family Communication: Patient's daughter. Disposition Plan: Monitored bed. Consults called: Physical therapy and speech therapy. Admission status: Observation.

## 2023-12-24 NOTE — Evaluation (Signed)
 Clinical/Bedside Swallow Evaluation Patient Details  Name: Randall Mccormick MRN: [id number] Date of Birth: March 30, 1956  Today's Date: 12/24/2023 Time: SLP Start Time (ACUTE ONLY): 1555 SLP Stop Time (ACUTE ONLY): 1610 SLP Time Calculation (min) (ACUTE ONLY): 15 min  Past Medical History:  Past Medical History:  Diagnosis Date   Anxiety    Cobalamin deficiency    Degeneration of lumbar or lumbosacral intervertebral disc    Depression    Gouty arthropathy, unspecified    Hemorrhoids    Hyperlipidemia    Hypertension    Multiple fractures of ribs of left side 04/20/2013   FALL FROM A LADDER ON    Vitamin D deficiency    Past Surgical History:  Past Surgical History:  Procedure Laterality Date   APPENDECTOMY     BACK SURGERY     x 6   CHOLECYSTECTOMY  06/24/2008   [facility]   ESOPHAGOGASTRODUODENOSCOPY N/A 04/06/2022   Procedure: ESOPHAGOGASTRODUODENOSCOPY (EGD);  Surgeon: Legrand Victory LITTIE DOUGLAS, MD;  Location: [facility] ENDOSCOPY;  Service: Gastroenterology;  Laterality: N/A;   TONSILLECTOMY     TOTAL KNEE ARTHROPLASTY Right 03/28/2022   Procedure: RIGHT TOTAL KNEE ARTHROPLASTY;  Surgeon: Addie Cordella Hamilton, MD;  Location: [facility] OR;  Service: Orthopedics;  Laterality: Right;   HPI:  Patient is a 68 y.o. male with PMH: HTN, BPH, gout, multiple spine surgeries, anxiety, HLD. He presented to the hospital on 12/24/2023 after progressive weakness over the past couple days and not having any BM's for the past week. In addition, he has had increased productive cough. In ER, patient was weak appearing, had persistent productive cough. Labs were unremarkable, CXR showed atelectasis. He was started on antibiotics for possible PNA and SLP swallow evaluation ordered secondary to potential for aspiration PNA.    Assessment / Plan / Recommendation  Clinical Impression  Patient presents with a questionable pharyngeal phase dysphagia as per this bedside swallow evaluation. Swallow was assessed via straw sips  of thin liquids with patient exhibiting decreased hyolaryngeal movement, no immediate overt s/s but with one instance of delayed cough which was productive of clear secretions. He endorses coughing up this type of secretion for the past 3 weeks. His lips appear slightly swollen but he denies any oral or facial pain, just that his mouth and lips feels dry. He also added that he had a swallow test at [facility] about a month ago and they said everything was going down fine. SLP discussed this further with patient and he indicated he was standing for the test and was given different consistencies of barium. SLP unable to locate this test but patient indicated his wife would be able to give more information when she returns tomorrow. SLP will f/u next date. SLP Visit Diagnosis: Dysphagia, unspecified (R13.10)    Aspiration Risk  Mild aspiration risk    Diet Recommendation Regular;Thin liquid    Liquid Administration via: Cup;Straw Medication Administration: Whole meds with liquid Supervision: Patient able to self feed Postural Changes: Seated upright at 90 degrees    Other  Recommendations Oral Care Recommendations: Oral care BID     Assistance Recommended at Discharge    Functional Status Assessment Patient has had a recent decline in their functional status and demonstrates the ability to make significant improvements in function in a reasonable and predictable amount of time.  Frequency and Duration min 1 x/week  1 week       Prognosis Prognosis for improved oropharyngeal function: Good      Swallow Study  General Date of Onset: 12/24/23 HPI: Patient is a 68 y.o. male with PMH: HTN, BPH, gout, multiple spine surgeries, anxiety, HLD. He presented to the hospital on 12/24/2023 after progressive weakness over the past couple days and not having any BM's for the past week. In addition, he has had increased productive cough. In ER, patient was weak appearing, had persistent productive  cough. Labs were unremarkable, CXR showed atelectasis. He was started on antibiotics for possible PNA and SLP swallow evaluation ordered secondary to potential for aspiration PNA. Type of Study: Bedside Swallow Evaluation Previous Swallow Assessment: patient indicated what sounded like an MBS which he said was completed at [facility] last month Diet Prior to this Study: Regular;Thin liquids (Level 0) Temperature Spikes Noted: No Respiratory Status: Room air History of Recent Intubation: No Behavior/Cognition: Alert;Cooperative;Pleasant mood Oral Cavity Assessment: Dry Oral Care Completed by SLP: No Oral Cavity - Dentition: Adequate natural dentition Vision: Functional for self-feeding Self-Feeding Abilities: Able to feed self Patient Positioning: Upright in bed Baseline Vocal Quality: Normal Volitional Cough: Strong Volitional Swallow: Able to elicit    Oral/Motor/Sensory Function Overall Oral Motor/Sensory Function: Within functional limits   Ice Chips     Thin Liquid Thin Liquid: Impaired Presentation: Straw Pharyngeal  Phase Impairments: Decreased hyoid-laryngeal movement;Cough - Delayed    Nectar Thick     Honey Thick     Puree Puree: Not tested   Solid     Solid: Not tested      Norleen IVAR Blase, MA, CCC-SLP Speech Therapy

## 2023-12-24 NOTE — ED Notes (Signed)
 Pt reports being frustrated with wait times. Apologies made. Reports he wants to go home but is amendable to staying for now. Awaiting admission upstairs. Daughter at bedside.

## 2023-12-24 NOTE — ED Notes (Signed)
 Pt repositioned in bed for comfort. No further needs voiced.

## 2023-12-24 NOTE — TOC Initial Note (Addendum)
 Transition of Care [facility]) - Initial/Assessment Note    Patient Details  Name: Randall Mccormick MRN: [id number] Date of Birth: [birth year]  [facility]) CM/SW Contact:    Nola Devere Hands, RN Phone Number: 12/24/2023, 3:08 PM  Clinical Narrative:                 Patient is from home with spouse, Active with [facility] for RN/PT/OT.CM confirmed services with [name] Ee, Liaison. TOC Team will continue to follow.        Patient Goals and CMS Choice            Expected Discharge Plan and Services                                              Prior Living Arrangements/Services                       Activities of Daily Living   ADL Screening (condition at time of admission) Independently performs ADLs?: No Does the patient have a NEW difficulty with bathing/dressing/toileting/self-feeding that is expected to last >3 days?: Yes (Initiates electronic notice to provider for possible OT consult) Does the patient have a NEW difficulty with getting in/out of bed, walking, or climbing stairs that is expected to last >3 days?: Yes (Initiates electronic notice to provider for possible PT consult) Does the patient have a NEW difficulty with communication that is expected to last >3 days?: No  Permission Sought/Granted                  Emotional Assessment              Admission diagnosis:  Hyponatremia [E87.1] Weakness [R53.1] Generalized weakness [R53.1] Acute cough [R05.1] Patient Active Problem List   Diagnosis Date Noted   Weakness 12/24/2023   Pneumonia 12/24/2023   Chronic pain 12/24/2023   Sepsis (HCC) 09/11/2023   Aspiration pneumonia (HCC) 04/12/2022   Pressure injury of skin 04/09/2022   Uremia 04/07/2022   Acute on chronic anemia 04/07/2022   Acute respiratory failure with hypoxia (HCC) 04/07/2022   Septic shock (HCC)    AKI (acute kidney injury) (HCC)    Hyperkalemia    Acute blood loss anemia    Hematemesis  with nausea    Acute encephalopathy 04/05/2022   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) 03/29/2022   OA (osteoarthritis) of knee 03/28/2022   Arthritis of knee 03/28/2022   Abnormal gait due to muscle weakness 03/28/2021   Urinary retention 03/28/2021   Incomplete paraplegia (HCC) 09/08/2020   Spasticity 09/08/2020   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/08/2020   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/2020   Hypoalbuminemia due to protein-calorie malnutrition (HCC)    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/2020   Constipation    Neurogenic bowel    Neurogenic bladder    Neuropathic pain    Chronic pain syndrome    Anxiety    Cobalamin deficiency    Degeneration of lumbar or lumbosacral intervertebral disc    Hemorrhoids    Hyperlipidemia    Hypertension    Depression    Gouty arthropathy    Vitamin D deficiency    Multiple fractures of ribs of left side 04/20/2013   PCP:  Keren [name], MD Pharmacy:   [facility] - [place], [place] - [address] [place] [address] Phone: [phone] Fax: [phone]  [facility] - [place], [place] - [address] [place] [address] Phone: [phone] Fax: [phone]  [facility] [address] [place] [address] Phone: [phone] Fax: [phone]  [id number] [place], [place] - [address] AT [facility] [address] [place] [address] Phone: [phone] Fax: [phone]     Social Drivers of Health (SDOH) Social History: SDOH Screenings   Food Insecurity: No Food Insecurity (12/24/2023)  Housing: Low Risk  (12/24/2023)  Transportation Needs: No Transportation Needs (12/24/2023)  Utilities: Not At Risk (12/24/2023)  Alcohol Screen: Low Risk  (09/12/2022)  Depression (PHQ2-9): Low Risk  (09/12/2022)  Financial Resource Strain: Low  Risk  (09/13/2022)  Physical Activity: Inactive (09/13/2022)  Social Connections: Moderately Integrated (12/24/2023)  Stress: Stress Concern Present (09/13/2022)  Tobacco Use: Medium Risk (12/24/2023)   SDOH Interventions:     Readmission Risk Interventions     No data to display

## 2023-12-24 NOTE — ED Notes (Signed)
 Patient given miralax  as ordered, patient would like to see if miralax  works before trying the enema. Stated he got a suppository last night

## 2023-12-24 NOTE — ED Notes (Signed)
 Pt arrived back to room from CT and Xray

## 2023-12-24 NOTE — ED Notes (Signed)
 Pt transported to CT at this time.

## 2023-12-24 NOTE — ED Notes (Signed)
 Pt taken to Xray.

## 2023-12-24 NOTE — Progress Notes (Signed)
 PROGRESS NOTE    Randall Mccormick  FMW:[id number] DOB: 03-26-1956 DOA: 12/23/2023 PCP: Keren [name], MD    Brief Narrative:   Randall Mccormick is a 68 y.o. male with past medical history significant for HTN, BPH, HLD, gout, history of multiple spine surgeries and ambulates with the use of a walker/wheelchair who presented to [facility] ED on 12/23/2023 with complaints of weakness, cough, decreased oral intake over the last several days.  Also reports no bowel movement over the last 1 week.  Family concerned about him not ambulating, but review of previous hospitalization March 2025, patient has been apparently wheelchair dependent following multiple spinal surgeries since 2022.  Additionally patient daughter reports sleep study and was advised to be on CPAP but patient has been noncompliant.  In the ED, temperature 98.9 F, HR 82, RR 20, BP 138/87, SpO2 99% on room air.  WBC 8.3, hemoglobin 15.6, platelet count 216.  Sodium 141, potassium 3.7, chloride 104, CO2 28, glucose 100, BUN 10, creatinine 0.95.  AST 26, ALT 27, total bilirubin 1.3.  Lactic acid 0.9.  BNP 8.4.  INR 1.1.  COVID/influenza/RSV PCR negative.  Urinalysis unrevealing.  CT head without contrast with no acute intracranial abnormality.  Chest x-ray with shallow inspiration, left lung base atelectasis.  Abdominal x-ray with diffuse gaseous distention of the bowel in the abdomen/pelvis, no substantial stool volume.  TRH consulted for admission for further evaluation and management of progressive weakness, concern for community-acquired pneumonia.  Assessment & Plan:   Concern for community-acquired pneumonia Patient presenting with progressive weakness, cough.  Patient is afebrile without leukocytosis.  Lactic acid within normal limits.  Chest x-ray with left lung base atelectasis versus consolidation. -- Azithromycin 500 mg IV every 24 hours -- Ceftriaxone  2 g IV every 24 hours -- Incentive spirometry, flutter valve  Constipation Patient  with poor mobility, history of recurrent impaction. -- Senokot-as 2 tablets p.o. BID -- MiraLAX  daily  Left first toe ulceration History of forefoot deformity, hammertoes, neuroma Follows with podiatry outpatient, seen by Dr. Augustus on 12/02/2023.  Underwent debridement of devitalized skin/soft tissue.  X-ray left foot with no concerning findings for bony destruction. -- On IV antibiotics as above -- Outpatient follow-up with podiatry  Rash bilateral lower extremities Patient with purpuric rash to bilateral lower extremities, unclear etiology.  Patient states has been there for roughly 1 week. -- Continue to monitor  HTN -- Amlodipine  2.5 mg p.o. twice daily  Hyperlipidemia -- Zetia  10 mg p.o. nightly  BPH --Tamsulosin   Chronic pain syndrome Home regimen includes baclofen , oxycodone , gabapentin  -- Hold home oxycodone  -- Continue baclofen  5 mg p.o. 3 times daily -- Gabapentin  3 mg p.o. daily  Anxiety/depression -- Fluoxetine  40 mg p.o. daily -- Decrease home Xanax  to 0.5 mg p.o. twice daily as needed anxiety  OSA VBG with pH 7.41, PCO257, PO2 32. -- Noncompliant with outpatient CPAP  Weakness/ability/deconditioning: Patient on multiple sedating medications to include Xanax , baclofen , olanzapine , oxycodone .  Also noncompliant with CPAP; although VBG with CO2 level within normal limits.  Ammonia/TSH level within normal limits. -- Decrease Xanax  dose -- Hold home olanzapine , oxycodone  -- PT/OT evaluation  DVT prophylaxis: enoxaparin  (LOVENOX ) injection 40 mg Start: 12/24/23 1000    Code Status: Full Code Family Communication: No family present bedside this morning  Disposition Plan:  Level of care: Telemetry Medical Status is: Inpatient Remains inpatient appropriate because: IV antibiotics    Consultants:  None  Procedures:  None  Antimicrobials:  Azithromycin 7/1>> Ceftriaxone  7/2>>  Subjective: Patient seen examined bedside, lying in bed.  Remains in ED  holding area.  Continues to complain of generalized weakness.  Started on IV antibiotics.  Left foot x-ray with advanced degenerative changes in MTP/IP joints, no gross bony destruction.  Denies headache, no chest pain, no shortness of breath, no abdominal pain, no fever/chills/night sweats, no nausea/vomiting/diarrhea, no focal weakness, no paresthesia.  No acute events overnight per RN staff.  Objective: Vitals:   12/24/23 1010 12/24/23 1017 12/24/23 1100 12/24/23 1300  BP: (!) 143/88  136/76   Pulse:   84   Resp:   (!) 24   Temp:  99 F (37.2 C)  98.5 F (36.9 C)  TempSrc:  Oral  Oral  SpO2:   97%   Weight:      Height:        Intake/Output Summary (Last 24 hours) at 12/24/2023 1731 Last data filed at 12/24/2023 1300 Gross per 24 hour  Intake --  Output 500 ml  Net -500 ml   Filed Weights   12/23/23 1816  Weight: 99.8 kg    Examination:  Physical Exam: GEN: NAD, alert and oriented x 3, chronically ill in appearance, appears older than stated age HEENT: NCAT, PERRL, EOMI, sclera clear, MMM PULM: CTAB w/o wheezes/crackles, normal respiratory effort, on room air CV: RRR w/o M/G/R GI: abd soft, NTND, + BS MSK: no peripheral edema, moves all extremities independently NEURO: No focal neurological deficit PSYCH: normal mood/affect Integumentary: Left foot great toe with ulceration to dorsal surface without fluctuance, drainage or erythema, hematoma noted to anterior surface left shin with purpuric rash to bilateral lower extremities; otherwise no other concerning rashes/lesions/wounds noted on exposed skin surfaces     Data Reviewed: I have personally reviewed following labs and imaging studies  CBC: Recent Labs  Lab 12/23/23 1844 12/24/23 0614  WBC 8.3 8.8  NEUTROABS 5.7 6.2  HGB 15.6 16.3  HCT 49.2 51.0  MCV 96.9 96.2  PLT 216 226   Basic Metabolic Panel: Recent Labs  Lab 12/23/23 1844 12/24/23 0614  NA 141 141  K 3.7 3.7  CL 104 104  CO2 28 25  GLUCOSE  100* 104*  BUN 10 8  CREATININE 0.95 0.85  CALCIUM  9.0 9.2   GFR: Estimated Creatinine Clearance: 95.3 mL/min (by C-G formula based on SCr of 0.85 mg/dL). Liver Function Tests: Recent Labs  Lab 12/23/23 1844 12/24/23 0614  AST 27 27  ALT 22 20  ALKPHOS 91 92  BILITOT 1.3* 1.7*  PROT 7.1 7.2  ALBUMIN 3.4* 3.4*   Recent Labs  Lab 12/23/23 1844  LIPASE 26   Recent Labs  Lab 12/24/23 0721  AMMONIA 23   Coagulation Profile: Recent Labs  Lab 12/23/23 2039  INR 1.1   Cardiac Enzymes: No results for input(s): CKTOTAL, CKMB, CKMBINDEX, TROPONINI in the last 168 hours. BNP (last 3 results) No results for input(s): PROBNP in the last 8760 hours. HbA1C: No results for input(s): HGBA1C in the last 72 hours. CBG: No results for input(s): GLUCAP in the last 168 hours. Lipid Profile: No results for input(s): CHOL, HDL, LDLCALC, TRIG, CHOLHDL, LDLDIRECT in the last 72 hours. Thyroid Function Tests: Recent Labs    12/24/23 0630  TSH 0.894   Anemia Panel: No results for input(s): VITAMINB12, FOLATE, FERRITIN, TIBC, IRON, RETICCTPCT in the last 72 hours. Sepsis Labs: Recent Labs  Lab 12/23/23 1844  LATICACIDVEN 0.9    Recent Results (from the past 240 hours)  Resp panel by  RT-PCR (RSV, Flu A&B, Covid) Anterior Nasal Swab     Status: None   Collection Time: 12/23/23  6:21 PM   Specimen: Anterior Nasal Swab  Result Value Ref Range Status   SARS Coronavirus 2 by RT PCR NEGATIVE NEGATIVE Final   Influenza A by PCR NEGATIVE NEGATIVE Final   Influenza B by PCR NEGATIVE NEGATIVE Final    Comment: (NOTE) The Xpert Xpress SARS-CoV-2/FLU/RSV plus assay is intended as an aid in the diagnosis of influenza from Nasopharyngeal swab specimens and should not be used as a sole basis for treatment. Nasal washings and aspirates are unacceptable for Xpert Xpress SARS-CoV-2/FLU/RSV testing.  Fact Sheet for  Patients: BloggerCourse.com  Fact Sheet for Healthcare Providers: SeriousBroker.it  This test is not yet approved or cleared by the [place]  FDA and has been authorized for detection and/or diagnosis of SARS-CoV-2 by FDA under an Emergency Use Authorization (EUA). This EUA will remain in effect (meaning this test can be used) for the duration of the COVID-19 declaration under Section 564(b)(1) of the Act, 21 U.S.C. section 360bbb-3(b)(1), unless the authorization is terminated or revoked.     Resp Syncytial Virus by PCR NEGATIVE NEGATIVE Final    Comment: (NOTE) Fact Sheet for Patients: BloggerCourse.com  Fact Sheet for Healthcare Providers: SeriousBroker.it  This test is not yet approved or cleared by the [place]  FDA and has been authorized for detection and/or diagnosis of SARS-CoV-2 by FDA under an Emergency Use Authorization (EUA). This EUA will remain in effect (meaning this test can be used) for the duration of the COVID-19 declaration under Section 564(b)(1) of the Act, 21 U.S.C. section 360bbb-3(b)(1), unless the authorization is terminated or revoked.  Performed at [facility], [address], [place], [place] [address]          Radiology Studies: DG Foot 2 Views Left Result Date: 12/24/2023 CLINICAL DATA:  Cellulitis. EXAM: LEFT FOOT - 2 VIEW COMPARISON:  None Available. FINDINGS: Advanced degenerative changes are seen in the MTP and IP joints of the great toe. Assessment of the second through fifth toes is markedly limited by superimposition of bony anatomy on both projections. No gross bony destruction to suggest osteomyelitis. IMPRESSION: 1. Advanced degenerative changes in the MTP and IP joints of the great toe. 2. No gross bony destruction to suggest osteomyelitis. MRI of the foot with and without contrast could be used for more sensitive  evaluation as clinically warranted. Electronically Signed   By: Camellia Candle M.D.   On: 12/24/2023 07:43   DG Abd 1 View Result Date: 12/24/2023 CLINICAL DATA:  Constipation. EXAM: ABDOMEN - 1 VIEW COMPARISON:  No comparison studies available. FINDINGS: Diffuse gaseous distension of bowel noted in the abdomen and pelvis, mainly colon. No substantial stool volume. No rectal gas evident. Extensive spinal fusion hardware evident in the visualized lower thoracic spine, lumbar spine, and sacrum. IMPRESSION: Diffuse gaseous distension of bowel in the abdomen and pelvis, mainly colon. No substantial stool volume. Findings could reflect ileus/dysmotility or distal colonic stricture/obstruction. Colitis also a consideration. Electronically Signed   By: Camellia Candle M.D.   On: 12/24/2023 07:10   CT HEAD WO CONTRAST ( ) Result Date: 12/24/2023 CLINICAL DATA:  68 year old male with weakness and altered mental status. EXAM: CT HEAD WITHOUT CONTRAST TECHNIQUE: Contiguous axial images were obtained from the base of the skull through the vertex without intravenous contrast. RADIATION DOSE REDUCTION: This exam was performed according to the departmental dose-optimization program which includes automated exposure control, adjustment  of the mA and/or kV according to patient size and/or use of iterative reconstruction technique. COMPARISON:  Brain MRI 04/09/2022.  Head CT 08/09/2022. FINDINGS: Brain: Stable cerebral volume since 2023. No midline shift, ventriculomegaly, mass effect, evidence of mass lesion, intracranial hemorrhage or evidence of cortically based acute infarction. Gray-white differentiation within normal limits for age. Vascular: Calcified atherosclerosis at the skull base. No suspicious intracranial vascular hyperdensity. Skull: No acute osseous abnormality identified. Chronic retained rounded metal foreign body in the floor of the right orbit, unchanged and did Not result in significant susceptibility artifact  on 2023 MRI. Sinuses/Orbits: Visualized paranasal sinuses and mastoids are stable and well aerated. Other: No acute orbit or scalp soft tissue finding. IMPRESSION: No acute intracranial abnormality. Stable since 2023 and negative for age noncontrast CT appearance of the brain. Electronically Signed   By: VEAR Hurst M.D.   On: 12/24/2023 07:03   DG Chest Port 1 View Result Date: 12/23/2023 CLINICAL DATA:  Cough and weakness. EXAM: PORTABLE CHEST 1 VIEW COMPARISON:  Chest radiograph dated 10/05/2023. FINDINGS: Shallow inspiration and left lung base atelectasis. Pneumonia is less likely. No pleural effusion or pneumothorax. Stable cardiac silhouette. No acute osseous pathology. Spinal fusion hardware. IMPRESSION: Shallow inspiration and left lung base atelectasis. Electronically Signed   By: Vanetta Chou M.D.   On: 12/23/2023 19:30        Scheduled Meds:  allopurinol   300 mg Oral Daily   amLODipine   2.5 mg Oral Daily   aspirin   81 mg Oral Daily   baclofen   5 mg Oral TID   enoxaparin  (LOVENOX ) injection  40 mg Subcutaneous Q24H   ezetimibe   10 mg Oral QHS   FLUoxetine   40 mg Oral Daily   gabapentin   300 mg Oral Daily   polyethylene glycol  17 g Oral Daily   senna-docusate  2 tablet Oral QHS   tamsulosin   0.4 mg Oral QPC supper   Continuous Infusions:  azithromycin     cefTRIAXone  (ROCEPHIN )  IV Stopped (12/24/23 0945)   lactated ringers  75 mL/hr at 12/24/23 0915     LOS: 0 days    Time spent: 52 minutes spent on 12/24/2023 caring for this patient face-to-face including chart review, ordering labs/tests, documenting, discussion with nursing staff, consultants, updating family and interview/physical exam    Camellia PARAS [place], DO [facility] Available via Epic secure chat 7am-7pm After these hours, please refer to coverage provider listed on [email or web address] 12/24/2023, 5:31 PM

## 2023-12-25 ENCOUNTER — Inpatient Hospital Stay (HOSPITAL_COMMUNITY): Payer: MEDICARE

## 2023-12-25 DIAGNOSIS — K5901 Slow transit constipation: Secondary | ICD-10-CM

## 2023-12-25 DIAGNOSIS — R531 Weakness: Secondary | ICD-10-CM | POA: Diagnosis not present

## 2023-12-25 DIAGNOSIS — I10 Essential (primary) hypertension: Secondary | ICD-10-CM | POA: Diagnosis not present

## 2023-12-25 DIAGNOSIS — G894 Chronic pain syndrome: Secondary | ICD-10-CM | POA: Diagnosis not present

## 2023-12-25 DIAGNOSIS — J189 Pneumonia, unspecified organism: Secondary | ICD-10-CM

## 2023-12-25 LAB — MAGNESIUM: Magnesium: 1.8 mg/dL (ref 1.7–2.4)

## 2023-12-25 LAB — CBC
HCT: 46.6 % (ref 39.0–52.0)
Hemoglobin: 14.9 g/dL (ref 13.0–17.0)
MCH: 30.3 pg (ref 26.0–34.0)
MCHC: 32 g/dL (ref 30.0–36.0)
MCV: 94.7 fL (ref 80.0–100.0)
Platelets: 212 10*3/uL (ref 150–400)
RBC: 4.92 MIL/uL (ref 4.22–5.81)
RDW: 14 % (ref 11.5–15.5)
WBC: 9.3 10*3/uL (ref 4.0–10.5)
nRBC: 0 % (ref 0.0–0.2)

## 2023-12-25 LAB — COMPREHENSIVE METABOLIC PANEL WITH GFR
ALT: 21 U/L (ref 0–44)
AST: 22 U/L (ref 15–41)
Albumin: 2.7 g/dL — ABNORMAL LOW (ref 3.5–5.0)
Alkaline Phosphatase: 83 U/L (ref 38–126)
Anion gap: 8 (ref 5–15)
BUN: 5 mg/dL — ABNORMAL LOW (ref 8–23)
CO2: 27 mmol/L (ref 22–32)
Calcium: 8.7 mg/dL — ABNORMAL LOW (ref 8.9–10.3)
Chloride: 105 mmol/L (ref 98–111)
Creatinine, Ser: 0.94 mg/dL (ref 0.61–1.24)
GFR, Estimated: >60 mL/min (ref >60)
Glucose, Bld: 103 mg/dL — ABNORMAL HIGH (ref 70–99)
Potassium: 3.4 mmol/L — ABNORMAL LOW (ref 3.5–5.1)
Sodium: 140 mmol/L (ref 135–145)
Total Bilirubin: 1.2 mg/dL (ref 0.0–1.2)
Total Protein: 6.2 g/dL — ABNORMAL LOW (ref 6.5–8.1)

## 2023-12-25 MED ORDER — AZITHROMYCIN 500 MG PO TABS
500.0000 mg | ORAL_TABLET | Freq: Every day | ORAL | Status: DC
  Administered 2023-12-25: 500 mg via ORAL
  Filled 2023-12-25: qty 1

## 2023-12-25 MED ORDER — OLANZAPINE 5 MG PO TABS
20.0000 mg | ORAL_TABLET | Freq: Every day | ORAL | Status: DC
  Administered 2023-12-25 – 2023-12-26 (×2): 20 mg via ORAL
  Filled 2023-12-25 (×2): qty 4

## 2023-12-25 MED ORDER — GABAPENTIN 300 MG PO CAPS
300.0000 mg | ORAL_CAPSULE | Freq: Two times a day (BID) | ORAL | Status: DC
  Administered 2023-12-25 – 2023-12-27 (×4): 300 mg via ORAL
  Filled 2023-12-25 (×4): qty 1

## 2023-12-25 MED ORDER — ACETAMINOPHEN 325 MG PO TABS: 650.0000 mg | ORAL_TABLET | Freq: Four times a day (QID) | ORAL | Status: DC | PRN

## 2023-12-25 MED ORDER — OXYCODONE HCL 5 MG PO TABS
10.0000 mg | ORAL_TABLET | Freq: Three times a day (TID) | ORAL | Status: DC | PRN
  Administered 2023-12-25 – 2023-12-27 (×4): 10 mg via ORAL
  Filled 2023-12-25 (×4): qty 2

## 2023-12-25 MED ORDER — POTASSIUM CHLORIDE CRYS ER 20 MEQ PO TBCR
40.0000 meq | EXTENDED_RELEASE_TABLET | Freq: Once | ORAL | Status: AC
  Administered 2023-12-25: 40 meq via ORAL
  Filled 2023-12-25: qty 2

## 2023-12-25 NOTE — Progress Notes (Signed)
PHARMACIST - PHYSICIAN COMMUNICATION DR:   Ghimire CONCERNING: Antibiotic IV to Oral Route Change Policy  RECOMMENDATION: This patient is receiving azithromycin by the intravenous route.  Based on criteria approved by the Pharmacy and Therapeutics Committee, the antibiotic(s) is/are being converted to the equivalent oral dose form(s).   DESCRIPTION: These criteria include: Patient being treated for a respiratory tract infection, urinary tract infection, cellulitis or clostridium difficile associated diarrhea if on metronidazole The patient is not neutropenic and does not exhibit a GI malabsorption state The patient is eating (either orally or via tube) and/or has been taking other orally administered medications for a least 24 hours The patient is improving clinically and has a Tmax < 100.5  If you have questions about this conversion, please contact the Pharmacy Department  []  ( [phone] )  [place] []  ( [phone] )  [facility] [x]  ( [phone]  [place] []  ( [phone]  [facility] []  ( [phone] )  [facility]   

## 2023-12-25 NOTE — Progress Notes (Signed)
 Speech Language Pathology Treatment: Dysphagia  Patient Details Name: Randall Mccormick MRN: [id number] DOB: [birth year] Today's Date: 12/25/2023 Time: 8687-8672 SLP Time Calculation (min) (ACUTE ONLY): 15 min  Assessment / Plan / Recommendation Clinical Impression  Patient seen by SLP for skilled treatment focused on dysphagia goals. His wife and daughter were both present in the room as well. Through discussion with patient and family members, SLP learned that although patient coughs while eating, this has been going on for approximately the last 2-3 years. He had a barium swallow study followed by an endoscopy with dilation >3 months ago. He was diagnosed with asthma two weeks ago by pulmonology. Patient drank consecutive sips of thin liquids (water ) via straw sip with no overt s/s and no complaints of difficulty. SLP is not recommending further skilled intervention or assessment at this time and it appears that his primary dysphagia is esophageal in nature. If further concerns for aspiration, would recommend a modified barium swallow study.    HPI HPI: Patient is a 68 y.o. male with PMH: HTN, BPH, gout, multiple spine surgeries, anxiety, HLD. He presented to the hospital on 12/24/2023 after progressive weakness over the past couple days and not having any BM's for the past week. In addition, he has had increased productive cough. In ER, patient was weak appearing, had persistent productive cough. Labs were unremarkable, CXR showed atelectasis. He was started on antibiotics for possible PNA and SLP swallow evaluation ordered secondary to potential for aspiration PNA.      SLP Plan  Discharge SLP treatment due to (comment);All goals met          Recommendations  Diet recommendations: Regular;Thin liquid Liquids provided via: Cup;Straw Medication Administration: Whole meds with liquid Compensations: Slow rate;Small sips/bites Postural Changes and/or Swallow Maneuvers: Seated upright 90  degrees;Upright 30-60 min after meal                  Oral care BID     Dysphagia, unspecified (R13.10)     Discharge SLP treatment due to (comment);All goals met     Norleen IVAR Blase, MA, CCC-SLP Speech Therapy

## 2023-12-25 NOTE — Plan of Care (Signed)
  Problem: Education: Goal: Knowledge of General Education information will improve Description: Including pain rating scale, medication(s)/side effects and non-pharmacologic comfort measures Outcome: Progressing   Problem: Clinical Measurements: Goal: Diagnostic test results will improve Outcome: Progressing Goal: Respiratory complications will improve Outcome: Progressing   Problem: Activity: Goal: Risk for activity intolerance will decrease Outcome: Progressing   Problem: Nutrition: Goal: Adequate nutrition will be maintained Outcome: Progressing   Problem: Coping: Goal: Level of anxiety will decrease Outcome: Progressing   Problem: Elimination: Goal: Will not experience complications related to bowel motility Outcome: Progressing

## 2023-12-25 NOTE — Progress Notes (Addendum)
 PROGRESS NOTE        PATIENT DETAILS Name: Randall Mccormick Age: 68 y.o. Sex: male Date of Birth: 26-Mar-1956 Admit Date: 12/23/2023 Admitting Physician Redia LOISE Cleaver, MD ERE:[name], [name], MD  Brief Summary: Patient is a 68 y.o.  male with history of chronic pain syndrome- multiple abdominal spine surgeries-uses walker to walk around the house-but he uses a wheelchair for longer distances-presented with worsening weakness/cough-found to have PNA and subsequently admitted to the hospitalist service.  Significant events: 7/1>> admit to [facility]  Significant studies: 7/1>> CXR: Left lung base atelectasis 7/2>> CT head: No acute intracranial abnormality. 7/2>> x-ray abdomen: Gaseous distention-ileus/dysmotility or distal colonic stricture/obstruction. 7/2>> x-ray left foot: No bony destruction or osteomyelitis.  Significant microbiology data: 7/1>> COVID/influenza/RSV PCR: Negative 7/2>> blood culture: No growth  Procedures: None  Consults: None  Subjective: Lying comfortably in bed-denies any chest pain or shortness of breath.  Had multiple BMs overnight.  Continues to cough.  Completely awake/alert and answering questions appropriately.  Objective: Vitals: Blood pressure 134/69, pulse (!) 104, temperature 98 F (36.7 C), temperature source Oral, resp. rate 20, height 5' 8 (1.727 m), weight 99.8 kg, SpO2 90%.   Exam: Gen Exam:Alert awake-not in any distress HEENT:atraumatic, normocephalic Chest: B/L clear to auscultation anteriorly CVS:S1S2 regular Abdomen:soft non tender, non distended Extremities:no edema Neurology: Easily able to bend both knees-easily able to lift both legs off the bed scales, moderate distance. Skin: no rash  Pertinent Labs/Radiology:    Latest Ref Rng & Units 12/25/2023    6:00 AM 12/24/2023    6:14 AM 12/23/2023    6:44 PM  CBC  WBC 4.0 - 10.5 K/uL 9.3  8.8  8.3   Hemoglobin 13.0 - 17.0 g/dL 85.0  83.6  84.3    Hematocrit 39.0 - 52.0 % 46.6  51.0  49.2   Platelets 150 - 400 K/uL 212  226  216     Lab Results  Component Value Date   NA 140 12/25/2023   K 3.4 (L) 12/25/2023   CL 105 12/25/2023   CO2 27 12/25/2023      Assessment/Plan: Community-acquired pneumonia Coughing but nontoxic-appearing-afebrile Cultures negative so far Continue empiric antibiotics.  Constipation Multiple BMs overnight Benign abdominal exam-unclear what to make of x-ray findings on 7/2 Continue MiraLAX /senna  Left first toe ulceration History of forefoot deformity X-rays negative for osteomyelitis Continue follow-up with podiatry.  Rash bilateral lower extremities Improving/clearing per patient Probably secondary to antibiotics he took previously Continue to monitor-if no improvement for the next several days-will need outpatient dermatology evaluation.  Hypokalemia Replete/recheck  HTN  BP stable Amlodipine   HLD Zetia   BPH Flomax   Anxiety/depression Stable Continue Prozac  Continue Xanax -dosage reduced to 0.5 mg p.o. twice daily (home dosage 1 mg) Resume home dosage of olanzapine -and monitor overnight in the hospital.  If signs of oversedation/drowsiness-will adjust dosage.  Chronic pain syndrome On chronic narcotics-now that he is much more awake and alert-clinically improved-will resume oxycodone -but at a reduced dose of 10 mg every 8 as needed (home dosage 15 mg-2-3 times daily) Change Neurontin  back to home dosing-twice daily. Watch closely in the hospital and see how he does.  OSA Noncompliant to CPAP  Class 1 Obesity: Estimated body mass index is 33.45 kg/m as calculated from the following:   Height as of this encounter: 5' 8 (1.727 m).   Weight  as of this encounter: 99.8 kg.   Code status:   Code Status: Full Code   DVT Prophylaxis: enoxaparin  (LOVENOX ) injection 40 mg Start: 12/24/23 1000   Family Communication: None at bedside   Disposition Plan: Status is:  Inpatient Remains inpatient appropriate because: Severity of illness   Planned Discharge Destination:Home health   Diet: Diet Order             Diet Heart Room service appropriate? Yes; Fluid consistency: Thin  Diet effective now                     Antimicrobial agents: Anti-infectives (From admission, onward)    Start     Dose/Rate Route Frequency Ordered Stop   12/24/23 2200  azithromycin (ZITHROMAX) 500 mg in sodium chloride  0.9 % 250 mL IVPB        500 mg 250 mL/hr over 60 Minutes Intravenous Every 24 hours 12/24/23 0614 12/29/23 2159   12/24/23 0645  cefTRIAXone  (ROCEPHIN ) 2 g in sodium chloride  0.9 % 100 mL IVPB        2 g 200 mL/hr over 30 Minutes Intravenous Daily 12/24/23 0614 12/29/23 0959   12/23/23 2330  azithromycin (ZITHROMAX) tablet 500 mg        500 mg Oral  Once 12/23/23 2320 12/23/23 2328   12/23/23 0000  azithromycin (ZITHROMAX) 250 MG tablet        250 mg Oral Daily 12/23/23 2320          MEDICATIONS: Scheduled Meds:  allopurinol   300 mg Oral Daily   amLODipine   2.5 mg Oral Daily   aspirin   81 mg Oral Daily   baclofen   5 mg Oral TID   enoxaparin  (LOVENOX ) injection  40 mg Subcutaneous Q24H   ezetimibe   10 mg Oral QHS   FLUoxetine   40 mg Oral Daily   gabapentin   300 mg Oral Daily   polyethylene glycol  17 g Oral Daily   senna-docusate  2 tablet Oral BID   tamsulosin   0.4 mg Oral QPC supper   Continuous Infusions:  azithromycin 500 mg (12/24/23 2104)   cefTRIAXone  (ROCEPHIN )  IV 2 g (12/25/23 0840)   PRN Meds:.acetaminophen , albuterol , ALPRAZolam , guaiFENesin -dextromethorphan   I have personally reviewed following labs and imaging studies  LABORATORY DATA: CBC: Recent Labs  Lab 12/23/23 1844 12/24/23 0614 12/25/23 0600  WBC 8.3 8.8 9.3  NEUTROABS 5.7 6.2  --   HGB 15.6 16.3 14.9  HCT 49.2 51.0 46.6  MCV 96.9 96.2 94.7  PLT 216 226 212    Basic Metabolic Panel: Recent Labs  Lab 12/23/23 1844 12/24/23 0614  12/25/23 0600  NA 141 141 140  K 3.7 3.7 3.4*  CL 104 104 105  CO2 28 25 27   GLUCOSE 100* 104* 103*  BUN 10 8 5*  CREATININE 0.95 0.85 0.94  CALCIUM  9.0 9.2 8.7*  MG  --   --  1.8    GFR: Estimated Creatinine Clearance: 86.2 mL/min (by C-G formula based on SCr of 0.94 mg/dL).  Liver Function Tests: Recent Labs  Lab 12/23/23 1844 12/24/23 0614 12/25/23 0600  AST 27 27 22   ALT 22 20 21   ALKPHOS 91 92 83  BILITOT 1.3* 1.7* 1.2  PROT 7.1 7.2 6.2*  ALBUMIN 3.4* 3.4* 2.7*   Recent Labs  Lab 12/23/23 1844  LIPASE 26   Recent Labs  Lab 12/24/23 0721  AMMONIA 23    Coagulation Profile: Recent Labs  Lab 12/23/23 2039  INR  1.1    Cardiac Enzymes: No results for input(s): CKTOTAL, CKMB, CKMBINDEX, TROPONINI in the last 168 hours.  BNP (last 3 results) No results for input(s): PROBNP in the last 8760 hours.  Lipid Profile: No results for input(s): CHOL, HDL, LDLCALC, TRIG, CHOLHDL, LDLDIRECT in the last 72 hours.  Thyroid Function Tests: Recent Labs    12/24/23 0630  TSH 0.894    Anemia Panel: No results for input(s): VITAMINB12, FOLATE, FERRITIN, TIBC, IRON, RETICCTPCT in the last 72 hours.  Urine analysis:    Component Value Date/Time   COLORURINE YELLOW 12/23/2023 2133   APPEARANCEUR CLEAR 12/23/2023 2133   LABSPEC 1.012 12/23/2023 2133   PHURINE 6.0 12/23/2023 2133   GLUCOSEU NEGATIVE 12/23/2023 2133   HGBUR NEGATIVE 12/23/2023 2133   BILIRUBINUR NEGATIVE 12/23/2023 2133   KETONESUR NEGATIVE 12/23/2023 2133   PROTEINUR NEGATIVE 12/23/2023 2133   NITRITE NEGATIVE 12/23/2023 2133   LEUKOCYTESUR NEGATIVE 12/23/2023 2133    Sepsis Labs: Lactic Acid, Venous    Component Value Date/Time   LATICACIDVEN 0.9 12/23/2023 1844    MICROBIOLOGY: Recent Results (from the past 240 hours)  Resp panel by RT-PCR (RSV, Flu A&B, Covid) Anterior Nasal Swab     Status: None   Collection Time: 12/23/23  6:21 PM   Specimen:  Anterior Nasal Swab  Result Value Ref Range Status   SARS Coronavirus 2 by RT PCR NEGATIVE NEGATIVE Final   Influenza A by PCR NEGATIVE NEGATIVE Final   Influenza B by PCR NEGATIVE NEGATIVE Final    Comment: (NOTE) The Xpert Xpress SARS-CoV-2/FLU/RSV plus assay is intended as an aid in the diagnosis of influenza from Nasopharyngeal swab specimens and should not be used as a sole basis for treatment. Nasal washings and aspirates are unacceptable for Xpert Xpress SARS-CoV-2/FLU/RSV testing.  Fact Sheet for Patients: BloggerCourse.com  Fact Sheet for Healthcare Providers: SeriousBroker.it  This test is not yet approved or cleared by the [place]  FDA and has been authorized for detection and/or diagnosis of SARS-CoV-2 by FDA under an Emergency Use Authorization (EUA). This EUA will remain in effect (meaning this test can be used) for the duration of the COVID-19 declaration under Section 564(b)(1) of the Act, 21 U.S.C. section 360bbb-3(b)(1), unless the authorization is terminated or revoked.     Resp Syncytial Virus by PCR NEGATIVE NEGATIVE Final    Comment: (NOTE) Fact Sheet for Patients: BloggerCourse.com  Fact Sheet for Healthcare Providers: SeriousBroker.it  This test is not yet approved or cleared by the [place]  FDA and has been authorized for detection and/or diagnosis of SARS-CoV-2 by FDA under an Emergency Use Authorization (EUA). This EUA will remain in effect (meaning this test can be used) for the duration of the COVID-19 declaration under Section 564(b)(1) of the Act, 21 U.S.C. section 360bbb-3(b)(1), unless the authorization is terminated or revoked.  Performed at [facility], [address], [place], [place] [address]   Culture, blood (Routine X 2) w Reflex to ID Panel     Status: None (Preliminary result)   Collection Time: 12/24/23  8:23 AM    Specimen: BLOOD  Result Value Ref Range Status   Specimen Description BLOOD SITE NOT SPECIFIED  Final   Special Requests   Final    BOTTLES DRAWN AEROBIC AND ANAEROBIC Blood Culture adequate volume   Culture   Final    NO GROWTH < 24 HOURS Performed at [facility], [address], [place], [place] [address]    Report Status PENDING  Incomplete  Culture, blood (Routine X 2) w Reflex to ID Panel     Status: None (Preliminary result)   Collection Time: 12/24/23  8:59 AM   Specimen: BLOOD LEFT HAND  Result Value Ref Range Status   Specimen Description BLOOD LEFT HAND  Final   Special Requests   Final    BOTTLES DRAWN AEROBIC AND ANAEROBIC Blood Culture results may not be optimal due to an inadequate volume of blood received in culture bottles   Culture   Final    NO GROWTH < 24 HOURS Performed at [facility], [address], [place], [place] [address]    Report Status PENDING  Incomplete    RADIOLOGY STUDIES/RESULTS: DG Foot 2 Views Left Result Date: 12/24/2023 CLINICAL DATA:  Cellulitis. EXAM: LEFT FOOT - 2 VIEW COMPARISON:  None Available. FINDINGS: Advanced degenerative changes are seen in the MTP and IP joints of the great toe. Assessment of the second through fifth toes is markedly limited by superimposition of bony anatomy on both projections. No gross bony destruction to suggest osteomyelitis. IMPRESSION: 1. Advanced degenerative changes in the MTP and IP joints of the great toe. 2. No gross bony destruction to suggest osteomyelitis. MRI of the foot with and without contrast could be used for more sensitive evaluation as clinically warranted. Electronically Signed   By: Camellia Candle M.D.   On: 12/24/2023 07:43   DG Abd 1 View Result Date: 12/24/2023 CLINICAL DATA:  Constipation. EXAM: ABDOMEN - 1 VIEW COMPARISON:  No comparison studies available. FINDINGS: Diffuse gaseous distension of bowel noted in the abdomen and pelvis, mainly colon. No substantial stool volume. No  rectal gas evident. Extensive spinal fusion hardware evident in the visualized lower thoracic spine, lumbar spine, and sacrum. IMPRESSION: Diffuse gaseous distension of bowel in the abdomen and pelvis, mainly colon. No substantial stool volume. Findings could reflect ileus/dysmotility or distal colonic stricture/obstruction. Colitis also a consideration. Electronically Signed   By: Camellia Candle M.D.   On: 12/24/2023 07:10   CT HEAD WO CONTRAST ( ) Result Date: 12/24/2023 CLINICAL DATA:  68 year old male with weakness and altered mental status. EXAM: CT HEAD WITHOUT CONTRAST TECHNIQUE: Contiguous axial images were obtained from the base of the skull through the vertex without intravenous contrast. RADIATION DOSE REDUCTION: This exam was performed according to the departmental dose-optimization program which includes automated exposure control, adjustment of the mA and/or kV according to patient size and/or use of iterative reconstruction technique. COMPARISON:  Brain MRI 04/09/2022.  Head CT 08/09/2022. FINDINGS: Brain: Stable cerebral volume since 2023. No midline shift, ventriculomegaly, mass effect, evidence of mass lesion, intracranial hemorrhage or evidence of cortically based acute infarction. Gray-white differentiation within normal limits for age. Vascular: Calcified atherosclerosis at the skull base. No suspicious intracranial vascular hyperdensity. Skull: No acute osseous abnormality identified. Chronic retained rounded metal foreign body in the floor of the right orbit, unchanged and did Not result in significant susceptibility artifact on 2023 MRI. Sinuses/Orbits: Visualized paranasal sinuses and mastoids are stable and well aerated. Other: No acute orbit or scalp soft tissue finding. IMPRESSION: No acute intracranial abnormality. Stable since 2023 and negative for age noncontrast CT appearance of the brain. Electronically Signed   By: VEAR Hurst M.D.   On: 12/24/2023 07:03   DG Chest Port 1  View Result Date: 12/23/2023 CLINICAL DATA:  Cough and weakness. EXAM: PORTABLE CHEST 1 VIEW COMPARISON:  Chest radiograph dated 10/05/2023. FINDINGS: Shallow inspiration and left lung base atelectasis. Pneumonia is less likely.  No pleural effusion or pneumothorax. Stable cardiac silhouette. No acute osseous pathology. Spinal fusion hardware. IMPRESSION: Shallow inspiration and left lung base atelectasis. Electronically Signed   By: Vanetta Chou M.D.   On: 12/23/2023 19:30     LOS: 1 day   Donalda Applebaum, MD  [facility]    To contact the attending provider between 7A-7P or the covering provider during after hours 7P-7A, please log into the web site [email or web address] and access using universal [place] password for that web site. If you do not have the password, please call the hospital operator.  12/25/2023, 10:00 AM

## 2023-12-25 NOTE — NC FL2 (Signed)
 [place]  MEDICAID FL2 LEVEL OF CARE FORM     IDENTIFICATION  Patient Name: Randall Mccormick Birthdate: Nov 13, 1955 Sex: male Admission Date (Current Location): 12/23/2023  [facility] and [place] Number:  [employer] and Address:  [facility] [place], [address], [place], [place] [address]      Provider Number: [id number]  Attending Physician Name and Address:  Raenelle Donalda HERO, MD  Relative Name and Phone Number:       Current Level of Care: Hospital Recommended Level of Care: Skilled Nursing Facility Prior Approval Number:    Date Approved/Denied:   PASRR Number: [id number]  Discharge Plan: SNF    Current Diagnoses: Patient Active Problem List   Diagnosis Date Noted   Weakness 12/24/2023   Pneumonia 12/24/2023   Chronic pain 12/24/2023   Sepsis (HCC) 09/11/2023   Aspiration pneumonia (HCC) 04/12/2022   Pressure injury of skin 04/09/2022   Uremia 04/07/2022   Acute on chronic anemia 04/07/2022   Acute respiratory failure with hypoxia (HCC) 04/07/2022   Septic shock (HCC)    AKI (acute kidney injury) (HCC)    Hyperkalemia    Acute blood loss anemia    Hematemesis with nausea    Acute encephalopathy 04/05/2022   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) 03/29/2022   OA (osteoarthritis) of knee 03/28/2022   Arthritis of knee 03/28/2022   Abnormal gait due to muscle weakness 03/28/2021   Urinary retention 03/28/2021   Incomplete paraplegia (HCC) 09/08/2020   Spasticity 09/08/2020   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/08/2020   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/2020   Hypoalbuminemia due to protein-calorie malnutrition [facility]    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/2020   Constipation    Neurogenic bowel    Neurogenic bladder    Neuropathic pain    Chronic pain syndrome    Anxiety    Cobalamin deficiency    Degeneration of lumbar or lumbosacral intervertebral disc    Hemorrhoids    Hyperlipidemia    Hypertension     Depression    Gouty arthropathy    Vitamin D deficiency    Multiple fractures of ribs of left side 04/20/2013    Orientation RESPIRATION BLADDER Height & Weight     Time, Self, Situation, Place  O2 (2L nasal cannula) Incontinent Weight: 220 lb (99.8 kg) Height:  5' 8 (172.7 cm)  BEHAVIORAL SYMPTOMS/MOOD NEUROLOGICAL BOWEL NUTRITION STATUS      Incontinent Diet (see dc summary)  AMBULATORY STATUS COMMUNICATION OF NEEDS Skin   Limited Assist Verbally Normal                       Personal Care Assistance Level of Assistance  Bathing, Feeding, Dressing Bathing Assistance: Limited assistance Feeding assistance: Independent Dressing Assistance: Limited assistance     Functional Limitations Info             SPECIAL CARE FACTORS FREQUENCY  PT (By licensed PT), OT (By licensed OT)     PT Frequency: 5x/week OT Frequency: 5x/week            Contractures Contractures Info: Not present    Additional Factors Info  Code Status, Allergies Code Status Info: Full Allergies Info: NKA           Current Medications (12/25/2023):  This is the current hospital active medication list Current Facility-Administered Medications  Medication Dose Route Frequency Provider Last Rate Last Admin   acetaminophen  (TYLENOL ) tablet 650 mg  650 mg  Oral Q6H PRN Alfornia Madison, MD       albuterol  (PROVENTIL ) (2.5 MG/3ML) 0.083% nebulizer solution 3 mL  3 mL Inhalation Q4H PRN Franky Redia SAILOR, MD       allopurinol  (ZYLOPRIM ) tablet 300 mg  300 mg Oral Daily Franky Redia SAILOR, MD   300 mg at 12/25/23 9167   ALPRAZolam  (XANAX ) tablet 0.5 mg  0.5 mg Oral BID PRN [place], Camellia PARAS, DO       amLODipine  (NORVASC ) tablet 2.5 mg  2.5 mg Oral Daily Franky Redia SAILOR, MD   2.5 mg at 12/25/23 9165   aspirin  chewable tablet 81 mg  81 mg Oral Daily Franky Redia SAILOR, MD   81 mg at 12/25/23 9167   azithromycin (ZITHROMAX) tablet 500 mg  500 mg Oral QHS Pham, Minh Q, RPH-CPP        baclofen  (LIORESAL ) tablet 5 mg  5 mg Oral TID Kakrakandy, Arshad N, MD   5 mg at 12/25/23 9167   cefTRIAXone  (ROCEPHIN ) 2 g in sodium chloride  0.9 % 100 mL IVPB  2 g Intravenous Daily Kakrakandy, Arshad N, MD 200 mL/hr at 12/25/23 0840 2 g at 12/25/23 0840   enoxaparin  (LOVENOX ) injection 40 mg  40 mg Subcutaneous Q24H Franky Redia SAILOR, MD   40 mg at 12/25/23 9167   ezetimibe  (ZETIA ) tablet 10 mg  10 mg Oral QHS Kakrakandy, Arshad N, MD   10 mg at 12/24/23 2058   FLUoxetine  (PROZAC ) capsule 40 mg  40 mg Oral Daily [place], Eric J, DO   40 mg at 12/25/23 9167   gabapentin  (NEURONTIN ) capsule 300 mg  300 mg Oral BID Raenelle Donalda HERO, MD       guaiFENesin -dextromethorphan (ROBITUSSIN DM) 100-10 MG/5ML syrup 5 mL  5 mL Oral Q4H PRN [place], Eric J, DO   5 mL at 12/24/23 2301   OLANZapine  (ZYPREXA ) tablet 20 mg  20 mg Oral QHS Ghimire, Donalda HERO, MD       oxyCODONE  (Oxy IR/ROXICODONE ) immediate release tablet 10 mg  10 mg Oral Q8H PRN Raenelle Donalda HERO, MD   10 mg at 12/25/23 1101   polyethylene glycol (MIRALAX  / GLYCOLAX ) packet 17 g  17 g Oral Daily Franky Redia SAILOR, MD   17 g at 12/24/23 1014   senna-docusate (Senokot-S) tablet 2 tablet  2 tablet Oral BID [place], Eric J, DO   2 tablet at 12/25/23 9167   tamsulosin  (FLOMAX ) capsule 0.4 mg  0.4 mg Oral QPC supper Kakrakandy, Arshad N, MD   0.4 mg at 12/24/23 1800     Discharge Medications: Please see discharge summary for a list of discharge medications.  Relevant Imaging Results:  Relevant Lab Results:   Additional Information SSN: [id number]  Maridee Slape S Carmaleta Youngers, LCSW

## 2023-12-25 NOTE — Evaluation (Signed)
 Occupational Therapy Evaluation Patient Details Name: Randall Mccormick MRN: [id number] DOB: [birth year] Today's Date: 12/25/2023   History of Present Illness   Randall Mccormick is a 68 y.o. male who presented to the ED 12/23/23 for progressive weakness and inability to ambulate. PMHx: HTN, BPH, gout, history of multiple spine surgeries, and recent (~2 wks ago) great toe infection.     Clinical Impressions Pt typically walks with a rollator and assist. He is able to self feed and receives extensive assistance by his wife for all other ADLs and IADLs. Pt reports anterior R knee pain and chronic back pain. He is heavily dependent on B UE support in static standing. Pt was receiving HHPT 1x a week at home. Both pt and wife feel his weakness and instability has increased to the point they cannot manage at home. He is highly motivated to increased independence and reduce burden of care on his wife. Patient will benefit from continued inpatient follow up therapy, <3 hours/day      If plan is discharge home, recommend the following:   Two people to help with walking and/or transfers;A lot of help with bathing/dressing/bathroom;Assistance with cooking/housework;Assist for transportation;Help with stairs or ramp for entrance     Functional Status Assessment   Patient has had a recent decline in their functional status and demonstrates the ability to make significant improvements in function in a reasonable and predictable amount of time.     Equipment Recommendations   None recommended by OT     Recommendations for Other Services         Precautions/Restrictions   Precautions Precautions: Fall Recall of Precautions/Restrictions: Intact Restrictions Weight Bearing Restrictions Per Provider Order: No     Mobility Bed Mobility                    Transfers Overall transfer level: Needs assistance Equipment used: Rolling walker (2 wheels) Transfers: Sit to/from Stand Sit to  Stand: Mod assist                  Balance     Sitting balance-Leahy Scale: Fair     Standing balance support: Bilateral upper extremity supported, During functional activity, Reliant on assistive device for balance Standing balance-Leahy Scale: Poor Standing balance comment: unable to release walker in static standing                           ADL either performed or assessed with clinical judgement   ADL Overall ADL's : Needs assistance/impaired Eating/Feeding: Independent;Sitting   Grooming: Set up;Sitting Grooming Details (indicate cue type and reason): pt is unable to stand at sink and release grasp of walker Upper Body Bathing: Moderate assistance;Sitting   Lower Body Bathing: Total assistance;Sit to/from stand   Upper Body Dressing : Set up;Sitting   Lower Body Dressing: Total assistance;Sit to/from stand   Toilet Transfer: Moderate assistance   Toileting- Clothing Manipulation and Hygiene: Total assistance;Sit to/from stand               Vision Ability to See in Adequate Light: 0 Adequate Patient Visual Report: No change from baseline       Perception         Praxis         Pertinent Vitals/Pain Pain Assessment Pain Assessment: Faces Faces Pain Scale: Hurts even more Pain Location: back, R knee Pain Descriptors / Indicators: Discomfort Pain Intervention(s): Monitored during session     Extremity/Trunk  Assessment Upper Extremity Assessment Upper Extremity Assessment: Generalized weakness;Right hand dominant   Lower Extremity Assessment Lower Extremity Assessment: Defer to PT evaluation   Cervical / Trunk Assessment Cervical / Trunk Assessment: Kyphotic;Other exceptions (hx of multiple back surgeries)   Communication Communication Communication: No apparent difficulties   Cognition Arousal: Alert Behavior During Therapy: WFL for tasks assessed/performed Cognition: No apparent impairments             OT -  Cognition Comments: pt is motivated to increase independence to decreased burden on his wife                 Following commands: Intact       Cueing  General Comments   Cueing Techniques: Verbal cues  VSS on RA, 2-3 L [place] sats >94%.  On RA sats 91-92%  HR in the 80's bpm   Exercises     Shoulder Instructions      Home Living Family/patient expects to be discharged to:: Private residence Living Arrangements: Spouse/significant other Available Help at Discharge: Family;Available 24 hours/day Type of Home: House Home Access: Ramped entrance     Home Layout: One level     Bathroom Shower/Tub: Producer, television/film/video: Handicapped height     Home Equipment: Agricultural consultant (2 wheels);Rollator (4 wheels);Cane - single point;Shower seat;BSC/3in1;Grab bars - toilet;Wheelchair - manual;Grab bars - tub/shower;Lift chair;Adaptive equipment Adaptive Equipment: Reacher;Sock aid        Prior Functioning/Environment Prior Level of Function : Needs assist             Mobility Comments: walks with assist and rollator, got stuck on toilet just prior to this admission, sleeps in lift chair ADLs Comments: Pt can self feed,extensive assist for all other ADLs and IADLs.    OT Problem List: Decreased strength;Decreased activity tolerance;Impaired balance (sitting and/or standing);Decreased cognition;Decreased knowledge of use of DME or AE;Cardiopulmonary status limiting activity;Pain   OT Treatment/Interventions: Self-care/ADL training;DME and/or AE instruction;Therapeutic activities;Patient/family education;Balance training;Therapeutic exercise      OT Goals(Current goals can be found in the care plan section)   Acute Rehab OT Goals OT Goal Formulation: With patient Time For Goal Achievement: 01/08/24 Potential to Achieve Goals: Good ADL Goals Pt Will Perform Lower Body Bathing: with mod assist;with adaptive equipment;sit to/from stand Pt Will Perform Lower Body  Dressing: with mod assist;with adaptive equipment;sit to/from stand Pt Will Transfer to Toilet: ambulating;with contact guard assist;bedside commode Pt Will Perform Toileting - Clothing Manipulation and hygiene: with mod assist;with adaptive equipment;sit to/from stand Additional ADL Goal #1: Pt will stand statically with one hand stabilization as a precursor to standing ADLs.   OT Frequency:  Min 2X/week    Co-evaluation              AM-PAC OT 6 Clicks Daily Activity     Outcome Measure Help from another person eating meals?: None Help from another person taking care of personal grooming?: A Little Help from another person toileting, which includes using toliet, bedpan, or urinal?: Total Help from another person bathing (including washing, rinsing, drying)?: A Lot Help from another person to put on and taking off regular upper body clothing?: A Little Help from another person to put on and taking off regular lower body clothing?: Total 6 Click Score: 14   End of Session Equipment Utilized During Treatment: Rolling walker (2 wheels) Nurse Communication: Other (comment) (pt wants rehab prior to return home)  Activity Tolerance: Patient tolerated treatment well Patient left: in bed  OT Visit Diagnosis: Unsteadiness on feet (R26.81);Other abnormalities of gait and mobility (R26.89);Pain;Muscle weakness (generalized) (M62.81) Pain - Right/Left: Right Pain - part of body: Knee                Time: 1353-1433 OT Time Calculation (min): 40 min Charges:  OT General Charges $OT Visit: 1 Visit OT Evaluation $OT Eval Moderate Complexity: 1 Mod OT Treatments $Self Care/Home Management : 8-22 mins  Mliss HERO, OTR/L Acute Rehabilitation Services Office: [phone]   Kennth Mliss Helling 12/25/2023, 2:46 PM

## 2023-12-25 NOTE — TOC Initial Note (Addendum)
 Transition of Care [facility]) - Initial/Assessment Note    Patient Details  Name: Randall Mccormick MRN: [id number] Date of Birth: [birth year]  Transition of Care [facility]) CM/SW Contact:    Inocente GORMAN Kindle, LCSW Phone Number: 12/25/2023, 2:30 PM  Clinical Narrative:                 2:30pm-Patient and spouse requesting SNF rehab at [facility] or [facility]. CSW sent referrals for review. With Medicare, patient is eligible to go to SNF on 7/5. Otherwise would have to seek [facility] waiver bed.    2:49 PM-[facility] able to accept patient on Saturday. CSW updated patient's spouse. They believe they can transport patient by car (as long as he no longer needs oxygen). Updated MD. Will continue to follow.      Expected Discharge Plan: Skilled Nursing Facility Barriers to Discharge: Continued Medical Work up, English as a second language teacher   Patient Goals and CMS Choice Patient states their goals for this hospitalization and ongoing recovery are:: Rehab CMS Medicare.gov Compare Post Acute Care list provided to:: Patient Represenative (must comment) Choice offered to / list presented to : Spouse  ownership interest in [facility].provided to:: Spouse    Expected Discharge Plan and Services In-house Referral: Clinical Social Work   Post Acute Care Choice: Skilled Nursing Facility Living arrangements for the past 2 months: Single Family Home                                      Prior Living Arrangements/Services Living arrangements for the past 2 months: Single Family Home Lives with:: Spouse Patient language and need for interpreter reviewed:: Yes Do you feel safe going back to the place where you live?: Yes      Need for Family Participation in Patient Care: Yes (Comment) Care giver support system in place?: Yes (comment) Current home services: DME (rolling walker, wheelchair) Criminal Activity/Legal Involvement Pertinent to Current Situation/Hospitalization: No - Comment  as needed  Activities of Daily Living   ADL Screening (condition at time of admission) Independently performs ADLs?: No Does the patient have a NEW difficulty with bathing/dressing/toileting/self-feeding that is expected to last >3 days?: Yes (Initiates electronic notice to provider for possible OT consult) Does the patient have a NEW difficulty with getting in/out of bed, walking, or climbing stairs that is expected to last >3 days?: Yes (Initiates electronic notice to provider for possible PT consult) Does the patient have a NEW difficulty with communication that is expected to last >3 days?: No  Permission Sought/Granted Permission sought to share information with : Facility Medical sales representative, Family Supports Permission granted to share information with : Yes, Verbal Permission Granted     Permission granted to share info w AGENCY: SNFs  Permission granted to share info w Relationship: Spouse     Emotional Assessment Appearance:: Appears stated age Attitude/Demeanor/Rapport: Engaged Affect (typically observed): Accepting Orientation: : Oriented to Self, Oriented to Place, Oriented to  Time, Oriented to Situation Alcohol / Substance Use: Not Applicable Psych Involvement: No (comment)  Admission diagnosis:  Hyponatremia [E87.1] Weakness [R53.1] Generalized weakness [R53.1] Acute cough [R05.1] Patient Active Problem List   Diagnosis Date Noted   Weakness 12/24/2023   Pneumonia 12/24/2023   Chronic pain 12/24/2023   Sepsis (HCC) 09/11/2023   Aspiration pneumonia (HCC) 04/12/2022   Pressure injury of skin 04/09/2022   Uremia 04/07/2022   Acute on chronic anemia 04/07/2022  Acute respiratory failure with hypoxia (HCC) 04/07/2022   Septic shock (HCC)    AKI (acute kidney injury) (HCC)    Hyperkalemia    Acute blood loss anemia    Hematemesis with nausea    Acute encephalopathy 04/05/2022   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) 03/29/2022   OA  (osteoarthritis) of knee 03/28/2022   Arthritis of knee 03/28/2022   Abnormal gait due to muscle weakness 03/28/2021   Urinary retention 03/28/2021   Incomplete paraplegia (HCC) 09/08/2020   Spasticity 09/08/2020   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/08/2020   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/2020   Hypoalbuminemia due to protein-calorie malnutrition [facility])    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 [place] [address] Phone: [phone] Fax: [phone]  [facility] - [place], [place] - [address] [place] [address] Phone: [phone] Fax: [phone]  [facility] [address] [place] [address] Phone: [phone] Fax: [phone]  [id number] [place], [place] - [address] AT [facility] [address] [place] [address] Phone: [phone] Fax: [phone]     Social Drivers of Health (SDOH) Social History: SDOH Screenings   Food Insecurity: No Food Insecurity (12/24/2023)  Housing: Low Risk  (12/24/2023)  Transportation Needs: No Transportation Needs (12/24/2023)  Utilities: Not At Risk (12/24/2023)  Alcohol Screen: Low Risk  (09/12/2022)  Depression (PHQ2-9): Low Risk  (09/12/2022)  Financial Resource Strain: Low Risk  (09/13/2022)  Physical Activity: Inactive (09/13/2022)  Social Connections: Moderately Integrated (12/24/2023)   Stress: Stress Concern Present (09/13/2022)  Tobacco Use: Medium Risk (12/24/2023)   SDOH Interventions:     Readmission Risk Interventions     No data to display

## 2023-12-25 NOTE — Progress Notes (Addendum)
 Physical Therapy Treatment Patient Details Name: Randall Mccormick MRN: [id number] DOB: Mar 13, 1956 Today's Date: 12/25/2023   History of Present Illness Randall Mccormick is a 68 y.o. male who presented to the ED 12/23/23 for progressive weakness and inability to ambulate. PMHx: HTN, BPH, gout, history of multiple spine surgeries, and recent (~2 wks ago) great toe infection.    PT Comments  Pt is slowly progressing toward goals, limited by general weakness and deconditioning.  Emphasis today on transitions to EOB, trials of STS in the RW, cues for safest hand placement and progression of gait in the RW with chair follow.  Pt needs moderate assist for all mobility which pt's wife can not handle at this time.  Patient will benefit from continued inpatient follow up therapy, <3 hours/day.      If plan is discharge home, recommend the following: A lot of help with walking and/or transfers;A lot of help with bathing/dressing/bathroom;Assistance with cooking/housework;Assist for transportation;Help with stairs or ramp for entrance   Can travel by private vehicle        Equipment Recommendations  None recommended by PT    Recommendations for Other Services       Precautions / Restrictions Precautions Precautions: Fall Recall of Precautions/Restrictions: Intact Restrictions Weight Bearing Restrictions Per Provider Order: No     Mobility  Bed Mobility Overal bed mobility: Needs Assistance Bed Mobility: Rolling, Sidelying to Sit Rolling: Mod assist Sidelying to sit: Mod assist       General bed mobility comments: mod to scoot to EOB, patient appropriately using UE's and rocking to build momentum    Transfers Overall transfer level: Needs assistance   Transfers: Sit to/from Stand Sit to Stand: Mod assist           General transfer comment: cued for hand placement and technique    Ambulation/Gait Ambulation/Gait assistance: Min assist, Mod assist, +2 safety/equipment Gait Distance  (Feet): 3 Feet (F/B, then 6 feet F/B, then 10 feet, 15 and 18 feet respectively with sitting rest in between) Assistive device: Rolling walker (2 wheels) Gait Pattern/deviations: Step-through pattern Gait velocity: slower Gait velocity interpretation: <1.31 ft/sec, indicative of household ambulator   General Gait Details: short mildly unsteady steps, R LE lagging behind, likely from lack of trust with his L LE   Stairs             Wheelchair Mobility     Tilt Bed    Modified Rankin (Stroke Patients Only)       Balance Overall balance assessment: Needs assistance Sitting-balance support: Feet supported, No upper extremity supported Sitting balance-Leahy Scale: Fair Sitting balance - Comments: improved midline steadiness   Standing balance support: Bilateral upper extremity supported, During functional activity, Reliant on assistive device for balance Standing balance-Leahy Scale: Poor Standing balance comment: Pt dependent on RW and required modA to stabilize himself.                            Communication Communication Communication: No apparent difficulties  Cognition Arousal: Alert Behavior During Therapy: WFL for tasks assessed/performed   PT - Cognitive impairments: No family/caregiver present to determine baseline, No apparent impairments                         Following commands: Intact      Cueing Cueing Techniques: Verbal cues  Exercises      General Comments General comments (skin integrity, edema, etc.): VSS  on RA, 2-3 L [place] sats >94%.  On RA sats 91-92%  HR in the 80's bpm      Pertinent Vitals/Pain Pain Assessment Pain Assessment: Faces Faces Pain Scale: Hurts little more Pain Location: back Pain Descriptors / Indicators: Discomfort Pain Intervention(s): Monitored during session, Limited activity within patient's tolerance    Home Living                          Prior Function            PT Goals  (current goals can now be found in the care plan section) Acute Rehab PT Goals Patient Stated Goal: Return Home and get stronger PT Goal Formulation: With patient Time For Goal Achievement: 01/07/24 Potential to Achieve Goals: Good Progress towards PT goals: Progressing toward goals    Frequency    Min 2X/week      PT Plan      Co-evaluation              AM-PAC PT 6 Clicks Mobility   Outcome Measure  Help needed turning from your back to your side while in a flat bed without using bedrails?: A Little Help needed moving from lying on your back to sitting on the side of a flat bed without using bedrails?: A Lot Help needed moving to and from a bed to a chair (including a wheelchair)?: A Lot Help needed standing up from a chair using your arms (e.g., wheelchair or bedside chair)?: A Lot Help needed to walk in hospital room?: A Lot Help needed climbing 3-5 steps with a railing? : Total 6 Click Score: 12    End of Session Equipment Utilized During Treatment: Gait belt Activity Tolerance: Patient tolerated treatment well;Patient limited by fatigue Patient left: in chair;with call bell/phone within reach;with chair alarm set Nurse Communication: Mobility status PT Visit Diagnosis: Muscle weakness (generalized) (M62.81);Other abnormalities of gait and mobility (R26.89)     Time: 8953-8879 PT Time Calculation (min) (ACUTE ONLY): 34 min  Charges:    $Gait Training: 8-22 mins $Therapeutic Activity: 8-22 mins PT General Charges $$ ACUTE PT VISIT: 1 Visit                     12/25/2023  [place] HERO., PT Acute Rehabilitation Services [phone]  (office)   Vinie GAILS Christabel Camire 12/25/2023, 11:57 AM

## 2023-12-26 DIAGNOSIS — G894 Chronic pain syndrome: Secondary | ICD-10-CM | POA: Diagnosis not present

## 2023-12-26 DIAGNOSIS — I10 Essential (primary) hypertension: Secondary | ICD-10-CM | POA: Diagnosis not present

## 2023-12-26 DIAGNOSIS — K5901 Slow transit constipation: Secondary | ICD-10-CM | POA: Diagnosis not present

## 2023-12-26 DIAGNOSIS — R531 Weakness: Secondary | ICD-10-CM | POA: Diagnosis not present

## 2023-12-26 LAB — BASIC METABOLIC PANEL WITH GFR
Anion gap: 11 (ref 5–15)
BUN: 8 mg/dL (ref 8–23)
CO2: 29 mmol/L (ref 22–32)
Calcium: 9.1 mg/dL (ref 8.9–10.3)
Chloride: 101 mmol/L (ref 98–111)
Creatinine, Ser: 0.67 mg/dL (ref 0.61–1.24)
GFR, Estimated: >60 mL/min (ref >60)
Glucose, Bld: 90 mg/dL (ref 70–99)
Potassium: 3.8 mmol/L (ref 3.5–5.1)
Sodium: 141 mmol/L (ref 135–145)

## 2023-12-26 LAB — MAGNESIUM: Magnesium: 2 mg/dL (ref 1.7–2.4)

## 2023-12-26 MED ORDER — POLYETHYLENE GLYCOL 3350 17 G PO PACK
17.0000 g | PACK | Freq: Three times a day (TID) | ORAL | Status: DC
  Administered 2023-12-27: 17 g via ORAL
  Filled 2023-12-26: qty 1

## 2023-12-26 MED ORDER — AZITHROMYCIN 500 MG PO TABS
500.0000 mg | ORAL_TABLET | Freq: Every day | ORAL | Status: AC
  Administered 2023-12-26: 500 mg via ORAL
  Filled 2023-12-26: qty 1

## 2023-12-26 MED ORDER — AMOXICILLIN-POT CLAVULANATE 875-125 MG PO TABS
1.0000 | ORAL_TABLET | Freq: Two times a day (BID) | ORAL | Status: DC
  Administered 2023-12-27: 1 via ORAL
  Filled 2023-12-26: qty 1

## 2023-12-26 NOTE — Plan of Care (Signed)
  Problem: Education: Goal: Knowledge of General Education information will improve Description: Including pain rating scale, medication(s)/side effects and non-pharmacologic comfort measures Outcome: Progressing   Problem: Clinical Measurements: Goal: Ability to maintain clinical measurements within normal limits will improve Outcome: Progressing Goal: Will remain free from infection Outcome: Progressing Goal: Diagnostic test results will improve Outcome: Progressing Goal: Respiratory complications will improve Outcome: Progressing   Problem: Activity: Goal: Risk for activity intolerance will decrease Outcome: Progressing   Problem: Coping: Goal: Level of anxiety will decrease Outcome: Progressing

## 2023-12-26 NOTE — TOC Progression Note (Signed)
 Transition of Care [facility]) - Progression Note    Patient Details  Name: Randall Mccormick MRN: [id number] Date of Birth: Aug 22, 1955  Transition of Care [facility]) CM/SW Contact  Bridget Cordella Simmonds, LCSW Phone Number: 12/26/2023, 9:00 AM  Clinical Narrative:   CSW confirmed with [facility] that they can receive pt tomorrrow. (Sat)    Expected Discharge Plan: Skilled Nursing Facility Barriers to Discharge: Continued Medical Work up, English as a second language teacher  Expected Discharge Plan and Services In-house Referral: Clinical Social Work   Post Acute Care Choice: Skilled Nursing Facility Living arrangements for the past 2 months: Single Family Home                                       Social Determinants of Health (SDOH) Interventions SDOH Screenings   Food Insecurity: No Food Insecurity (12/24/2023)  Housing: Low Risk  (12/24/2023)  Transportation Needs: No Transportation Needs (12/24/2023)  Utilities: Not At Risk (12/24/2023)  Alcohol Screen: Low Risk  (09/12/2022)  Depression (PHQ2-9): Low Risk  (09/12/2022)  Financial Resource Strain: Low Risk  (09/13/2022)  Physical Activity: Inactive (09/13/2022)  Social Connections: Moderately Integrated (12/24/2023)  Stress: Stress Concern Present (09/13/2022)  Tobacco Use: Medium Risk (12/24/2023)    Readmission Risk Interventions     No data to display

## 2023-12-26 NOTE — Progress Notes (Signed)
 PROGRESS NOTE        PATIENT DETAILS Name: Randall Mccormick Age: 68 y.o. Sex: male Date of Birth: 1956/05/15 Admit Date: 12/23/2023 Admitting Physician Redia LOISE Cleaver, MD ERE:[name], [name], MD  Brief Summary: Patient is a 68 y.o.  male with history of chronic pain syndrome- multiple abdominal spine surgeries-uses walker to walk around the house-but he uses a wheelchair for longer distances-presented with worsening weakness/cough-found to have PNA and subsequently admitted to the hospitalist service.  Significant events: 7/1>> admit to [facility]  Significant studies: 7/1>> CXR: Left lung base atelectasis 7/2>> CT head: No acute intracranial abnormality. 7/2>> x-ray abdomen: Gaseous distention-ileus/dysmotility or distal colonic stricture/obstruction. 7/2>> x-ray left foot: No bony destruction or osteomyelitis.  Significant microbiology data: 7/1>> COVID/influenza/RSV PCR: Negative 7/2>> blood culture: No growth  Procedures: None  Consults: None  Subjective: Feels better-no complaints.  Cough has improved.  Objective: Vitals: Blood pressure 128/69, pulse 73, temperature 97.6 F (36.4 C), temperature source Oral, resp. rate 16, height 5' 8 (1.727 m), weight 99.8 kg, SpO2 93%.   Exam: Awake/alert Chest: Clear to auscultation CVS: S1-S2 regular Neurology: Moving all 4 extremities-easily able to lift both legs off the bed.    Pertinent Labs/Radiology:    Latest Ref Rng & Units 12/25/2023    6:00 AM 12/24/2023    6:14 AM 12/23/2023    6:44 PM  CBC  WBC 4.0 - 10.5 K/uL 9.3  8.8  8.3   Hemoglobin 13.0 - 17.0 g/dL 85.0  83.6  84.3   Hematocrit 39.0 - 52.0 % 46.6  51.0  49.2   Platelets 150 - 400 K/uL 212  226  216     Lab Results  Component Value Date   NA 141 12/26/2023   K 3.8 12/26/2023   CL 101 12/26/2023   CO2 29 12/26/2023      Assessment/Plan: Community-acquired pneumonia Improved Stop Zithromax  today Transition Rocephin  to  Augmentin  for a few more days.  Constipation Multiple BMs overnight Benign abdominal exam-unclear what to make of x-ray findings on 7/2 Continue MiraLAX /senna  Left first toe ulceration History of forefoot deformity X-rays negative for osteomyelitis Continue follow-up with podiatry.  Rash bilateral lower extremities Improving/clearing per patient Probably secondary to antibiotics he took previously Continue to monitor-if no improvement for the next several days-will need outpatient dermatology evaluation.  Hypokalemia Repleted  HTN  BP stable Amlodipine   HLD Zetia   BPH Flomax   Anxiety/depression Stable Has been resumed back on his home dosage of Prozac /olanzapine  without any major issues Continue Xanax -dosage reduced to  0.5 mg p.o. twice daily (home dosage 1 mg)  Chronic pain syndrome Initially narcotics were held-since he clinically improved but better-he has been resumed on his usual dosing of Neurontin  and oxycodone -without any adverse effects-he is completely awake and alert this morning.  OSA Noncompliant to CPAP  Debility/deconditioning Due to acute illness Initially home health was recommended but upon reeval by PT on 7/3-SNF now recommended  Class 1 Obesity: Estimated body mass index is 33.45 kg/m as calculated from the following:   Height as of this encounter: 5' 8 (1.727 m).   Weight as of this encounter: 99.8 kg.   Code status:   Code Status: Full Code   DVT Prophylaxis: enoxaparin  (LOVENOX ) injection 40 mg Start: 12/24/23 1000   Family Communication: None at bedside   Disposition Plan: Status is: Inpatient  Remains inpatient appropriate because: Severity of illness   Planned Discharge Destination: SNF   Diet: Diet Order             Diet Heart Room service appropriate? Yes; Fluid consistency: Thin  Diet effective now                     Antimicrobial agents: Anti-infectives (From admission, onward)    Start     Dose/Rate  Route Frequency Ordered Stop   12/25/23 2200  azithromycin  (ZITHROMAX ) tablet 500 mg        500 mg Oral Daily at bedtime 12/25/23 1048 12/29/23 2159   12/24/23 2200  azithromycin  (ZITHROMAX ) 500 mg in sodium chloride  0.9 % 250 mL IVPB  Status:  Discontinued        500 mg 250 mL/hr over 60 Minutes Intravenous Every 24 hours 12/24/23 0614 12/25/23 1048   12/24/23 0645  cefTRIAXone  (ROCEPHIN ) 2 g in sodium chloride  0.9 % 100 mL IVPB        2 g 200 mL/hr over 30 Minutes Intravenous Daily 12/24/23 0614 12/29/23 0959   12/23/23 2330  azithromycin  (ZITHROMAX ) tablet 500 mg        500 mg Oral  Once 12/23/23 2320 12/23/23 2328   12/23/23 0000  azithromycin  (ZITHROMAX ) 250 MG tablet        250 mg Oral Daily 12/23/23 2320          MEDICATIONS: Scheduled Meds:  allopurinol   300 mg Oral Daily   amLODipine   2.5 mg Oral Daily   aspirin   81 mg Oral Daily   azithromycin   500 mg Oral QHS   baclofen   5 mg Oral TID   enoxaparin  (LOVENOX ) injection  40 mg Subcutaneous Q24H   ezetimibe   10 mg Oral QHS   FLUoxetine   40 mg Oral Daily   gabapentin   300 mg Oral BID   OLANZapine   20 mg Oral QHS   polyethylene glycol  17 g Oral Daily   senna-docusate  2 tablet Oral BID   tamsulosin   0.4 mg Oral QPC supper   Continuous Infusions:  cefTRIAXone  (ROCEPHIN )  IV 2 g (12/26/23 1004)   PRN Meds:.acetaminophen , albuterol , ALPRAZolam , guaiFENesin -dextromethorphan , oxyCODONE    I have personally reviewed following labs and imaging studies  LABORATORY DATA: CBC: Recent Labs  Lab 12/23/23 1844 12/24/23 0614 12/25/23 0600  WBC 8.3 8.8 9.3  NEUTROABS 5.7 6.2  --   HGB 15.6 16.3 14.9  HCT 49.2 51.0 46.6  MCV 96.9 96.2 94.7  PLT 216 226 212    Basic Metabolic Panel: Recent Labs  Lab 12/23/23 1844 12/24/23 0614 12/25/23 0600 12/26/23 0739  NA 141 141 140 141  K 3.7 3.7 3.4* 3.8  CL 104 104 105 101  CO2 28 25 27 29   GLUCOSE 100* 104* 103* 90  BUN 10 8 5* 8  CREATININE 0.95 0.85 0.94 0.67   CALCIUM  9.0 9.2 8.7* 9.1  MG  --   --  1.8 2.0    GFR: Estimated Creatinine Clearance: 101.3 mL/min (by C-G formula based on SCr of 0.67 mg/dL).  Liver Function Tests: Recent Labs  Lab 12/23/23 1844 12/24/23 0614 12/25/23 0600  AST 27 27 22   ALT 22 20 21   ALKPHOS 91 92 83  BILITOT 1.3* 1.7* 1.2  PROT 7.1 7.2 6.2*  ALBUMIN 3.4* 3.4* 2.7*   Recent Labs  Lab 12/23/23 1844  LIPASE 26   Recent Labs  Lab 12/24/23 0721  AMMONIA 23  Coagulation Profile: Recent Labs  Lab 12/23/23 2039  INR 1.1    Cardiac Enzymes: No results for input(s): CKTOTAL, CKMB, CKMBINDEX, TROPONINI in the last 168 hours.  BNP (last 3 results) No results for input(s): PROBNP in the last 8760 hours.  Lipid Profile: No results for input(s): CHOL, HDL, LDLCALC, TRIG, CHOLHDL, LDLDIRECT in the last 72 hours.  Thyroid Function Tests: Recent Labs    12/24/23 0630  TSH 0.894    Anemia Panel: No results for input(s): VITAMINB12, FOLATE, FERRITIN, TIBC, IRON, RETICCTPCT in the last 72 hours.  Urine analysis:    Component Value Date/Time   COLORURINE YELLOW 12/23/2023 2133   APPEARANCEUR CLEAR 12/23/2023 2133   LABSPEC 1.012 12/23/2023 2133   PHURINE 6.0 12/23/2023 2133   GLUCOSEU NEGATIVE 12/23/2023 2133   HGBUR NEGATIVE 12/23/2023 2133   BILIRUBINUR NEGATIVE 12/23/2023 2133   KETONESUR NEGATIVE 12/23/2023 2133   PROTEINUR NEGATIVE 12/23/2023 2133   NITRITE NEGATIVE 12/23/2023 2133   LEUKOCYTESUR NEGATIVE 12/23/2023 2133    Sepsis Labs: Lactic Acid, Venous    Component Value Date/Time   LATICACIDVEN 0.9 12/23/2023 1844    MICROBIOLOGY: Recent Results (from the past 240 hours)  Resp panel by RT-PCR (RSV, Flu A&B, Covid) Anterior Nasal Swab     Status: None   Collection Time: 12/23/23  6:21 PM   Specimen: Anterior Nasal Swab  Result Value Ref Range Status   SARS Coronavirus 2 by RT PCR NEGATIVE NEGATIVE Final   Influenza A by PCR NEGATIVE  NEGATIVE Final   Influenza B by PCR NEGATIVE NEGATIVE Final    Comment: (NOTE) The Xpert Xpress SARS-CoV-2/FLU/RSV plus assay is intended as an aid in the diagnosis of influenza from Nasopharyngeal swab specimens and should not be used as a sole basis for treatment. Nasal washings and aspirates are unacceptable for Xpert Xpress SARS-CoV-2/FLU/RSV testing.  Fact Sheet for Patients: BloggerCourse.com  Fact Sheet for Healthcare Providers: SeriousBroker.it  This test is not yet approved or cleared by the [place]  FDA and has been authorized for detection and/or diagnosis of SARS-CoV-2 by FDA under an Emergency Use Authorization (EUA). This EUA will remain in effect (meaning this test can be used) for the duration of the COVID-19 declaration under Section 564(b)(1) of the Act, 21 U.S.C. section 360bbb-3(b)(1), unless the authorization is terminated or revoked.     Resp Syncytial Virus by PCR NEGATIVE NEGATIVE Final    Comment: (NOTE) Fact Sheet for Patients: BloggerCourse.com  Fact Sheet for Healthcare Providers: SeriousBroker.it  This test is not yet approved or cleared by the [place]  FDA and has been authorized for detection and/or diagnosis of SARS-CoV-2 by FDA under an Emergency Use Authorization (EUA). This EUA will remain in effect (meaning this test can be used) for the duration of the COVID-19 declaration under Section 564(b)(1) of the Act, 21 U.S.C. section 360bbb-3(b)(1), unless the authorization is terminated or revoked.  Performed at [facility], [address] [place], [place] [address]   Culture, blood (Routine X 2) w Reflex to ID Panel     Status: None (Preliminary result)   Collection Time: 12/24/23  8:23 AM   Specimen: BLOOD  Result Value Ref Range Status   Specimen Description BLOOD SITE NOT SPECIFIED  Final   Special Requests   Final     BOTTLES DRAWN AEROBIC AND ANAEROBIC Blood Culture adequate volume   Culture   Final    NO GROWTH 2 DAYS Performed at [facility], [address]  [place], [place] [address]    Report Status PENDING  Incomplete  Culture, blood (Routine X 2) w Reflex to ID Panel     Status: None (Preliminary result)   Collection Time: 12/24/23  8:59 AM   Specimen: BLOOD LEFT HAND  Result Value Ref Range Status   Specimen Description BLOOD LEFT HAND  Final   Special Requests   Final    BOTTLES DRAWN AEROBIC AND ANAEROBIC Blood Culture results may not be optimal due to an inadequate volume of blood received in culture bottles   Culture   Final    NO GROWTH 2 DAYS Performed at [facility], [address], [place], [place] [address]    Report Status PENDING  Incomplete    RADIOLOGY STUDIES/RESULTS: DG Knee Complete 4 Views Right Result Date: 12/25/2023 CLINICAL DATA:  Right anterior knee pain. EXAM: RIGHT KNEE - COMPLETE 4+ VIEW COMPARISON:  05/27/2022 FINDINGS: Knee arthroplasty in expected alignment. No periprosthetic lucency. No acute or periprosthetic fracture. Quadriceps and patellar tendon enthesophytes. Increasing calcifications anterior to the distal femur which may represent heterotopic calcification. There is a chronic linear 4 mm foreign body within the anterior subcutaneous tissues, unchanged. IMPRESSION: 1. Knee arthroplasty without complication. 2. Increasing calcifications anterior to the distal femur which may represent heterotopic calcification. 3. Chronic tiny foreign body in the anterior soft tissues. Electronically Signed   By: Andrea Gasman M.D.   On: 12/25/2023 16:34     LOS: 2 days   Donalda Applebaum, MD  [facility]    To contact the attending provider between 7A-7P or the covering provider during after hours 7P-7A, please log into the web site [email or web address] and access using universal [place] password for that web site. If you do not have the password,  please call the hospital operator.  12/26/2023, 11:13 AM

## 2023-12-26 NOTE — Plan of Care (Signed)

## 2023-12-27 DIAGNOSIS — R531 Weakness: Secondary | ICD-10-CM | POA: Diagnosis not present

## 2023-12-27 DIAGNOSIS — G894 Chronic pain syndrome: Secondary | ICD-10-CM | POA: Diagnosis not present

## 2023-12-27 DIAGNOSIS — J189 Pneumonia, unspecified organism: Secondary | ICD-10-CM | POA: Diagnosis not present

## 2023-12-27 DIAGNOSIS — K5901 Slow transit constipation: Secondary | ICD-10-CM | POA: Diagnosis not present

## 2023-12-27 MED ORDER — ALPRAZOLAM 0.5 MG PO TABS: 0.5000 mg | ORAL_TABLET | Freq: Two times a day (BID) | ORAL | 0 refills | Status: DC | PRN

## 2023-12-27 MED ORDER — TAMSULOSIN HCL 0.4 MG PO CAPS: 0.4000 mg | ORAL_CAPSULE | Freq: Every day | ORAL | Status: DC

## 2023-12-27 MED ORDER — SENNOSIDES-DOCUSATE SODIUM 8.6-50 MG PO TABS: 2.0000 | ORAL_TABLET | Freq: Every day | ORAL | Status: DC

## 2023-12-27 MED ORDER — POLYETHYLENE GLYCOL 3350 17 G PO PACK: 17.0000 g | PACK | Freq: Three times a day (TID) | ORAL | Status: DC

## 2023-12-27 MED ORDER — AMOXICILLIN-POT CLAVULANATE 875-125 MG PO TABS: 1.0000 | ORAL_TABLET | Freq: Two times a day (BID) | ORAL | Status: AC

## 2023-12-27 MED ORDER — OXYCODONE HCL 10 MG PO TABS: 15.0000 mg | ORAL_TABLET | ORAL | 0 refills | Status: DC

## 2023-12-27 MED ORDER — GABAPENTIN 300 MG PO CAPS
300.0000 mg | ORAL_CAPSULE | Freq: Two times a day (BID) | ORAL | Status: DC
Start: 2023-12-27 — End: 2024-03-22

## 2023-12-27 NOTE — Plan of Care (Signed)

## 2023-12-27 NOTE — Discharge Summary (Signed)
 PATIENT DETAILS Name: Randall Mccormick Age: 68 y.o. Sex: male Date of Birth: 27-Oct-1955 MRN: [id number]. Admitting Physician: Redia LOISE Cleaver, MD ERE:[name], [name], MD  Admit Date: 12/23/2023 Discharge date: 12/27/2023  Recommendations for Outpatient Follow-up:  Follow up with PCP in 1-2 weeks Please obtain CMP/CBC in one week  Admitted From:  Home  Disposition: Skilled nursing facility   Discharge Condition: good  CODE STATUS:   Code Status: Full Code   Diet recommendation:  Diet Order             Diet - low sodium heart healthy           Diet Heart Room service appropriate? Yes; Fluid consistency: Thin  Diet effective now                    Brief Summary: Patient is a 68 y.o.  male with history of chronic pain syndrome- multiple abdominal spine surgeries-uses walker to walk around the house-but he uses a wheelchair for longer distances-presented with worsening weakness/cough-found to have PNA and subsequently admitted to the hospitalist service.   Significant events: 7/1>> admit to [facility]   Significant studies: 7/1>> CXR: Left lung base atelectasis 7/2>> CT head: No acute intracranial abnormality. 7/2>> x-ray abdomen: Gaseous distention-ileus/dysmotility or distal colonic stricture/obstruction. 7/2>> x-ray left foot: No bony destruction or osteomyelitis.   Significant microbiology data: 7/1>> COVID/influenza/RSV PCR: Negative 7/2>> blood culture: No growth   Procedures: None   Consults: None  Brief Hospital Course: Community-acquired pneumonia Improved Minimal cough No fever Initially on Rocephin /Zithromax -has been transition to Augmentin .    Constipation Multiple BMs overnight Benign abdominal exam-unclear what to make of x-ray findings on 7/2 Continue MiraLAX /senna   Left first toe ulceration History of forefoot deformity X-rays negative for osteomyelitis Continue follow-up with podiatry.   Rash bilateral lower  extremities Improving/clearing per patient Probably secondary to antibiotics he took previously Continue to monitor-if no improvement for the next several days-will need outpatient dermatology evaluation.   Hypokalemia Repleted   HTN  BP stable Amlodipine    HLD Zetia    BPH Flomax    Anxiety/depression Stable Has been resumed back on his home dosage of Prozac /olanzapine  without any major issues Continue Xanax -dosage reduced to  0.5 mg p.o. twice daily (home dosage 1 mg)   Chronic pain syndrome Initially narcotics were held-since he clinically improved but better He has been resumed on his usual dosing of oxycodone /baclofen  without any major issues-he is completely awake and alert Neurontin  has also been resumed-dosage has been changed to twice daily dosing    OSA Noncompliant to CPAP   Debility/deconditioning Due to acute illness Initially home health was recommended but upon reeval by PT on 7/3-SNF now recommended   Class 1 Obesity: Estimated body mass index is 33.45 kg/m as calculated from the following:   Height as of this encounter: 5' 8 (1.727 m).   Weight as of this encounter: 99.8 kg.    Discharge Diagnoses:  Principal Problem:   Weakness Active Problems:   Hyperlipidemia   Hypertension   Gouty arthropathy   Constipation   Chronic pain syndrome   Pneumonia   Chronic pain   Discharge Instructions:  Activity:  As tolerated with Full fall precautions use walker/cane & assistance as needed  Discharge Instructions     Call MD for:  difficulty breathing, headache or visual disturbances   Complete by: As directed    Call MD for:  extreme fatigue   Complete by: As directed  Call MD for:  persistant dizziness or light-headedness   Complete by: As directed    Diet - low sodium heart healthy   Complete by: As directed    Discharge instructions   Complete by: As directed    Follow with Primary MD  Keren [name], MD in 1-2 weeks  Please get a  complete blood count and chemistry panel checked by your Primary MD at your next visit, and again as instructed by your Primary MD.  Get Medicines reviewed and adjusted: Please take all your medications with you for your next visit with your Primary MD  Laboratory/radiological data: Please request your Primary MD to go over all hospital tests and procedure/radiological results at the follow up, please ask your Primary MD to get all Hospital records sent to his/her office.  In some cases, they will be blood work, cultures and biopsy results pending at the time of your discharge. Please request that your primary care M.D. follows up on these results.  Also Note the following: If you experience worsening of your admission symptoms, develop shortness of breath, life threatening emergency, suicidal or homicidal thoughts you must seek medical attention immediately by calling 911 or calling your MD immediately  if symptoms less severe.  You must read complete instructions/literature along with all the possible adverse reactions/side effects for all the Medicines you take and that have been prescribed to you. Take any new Medicines after you have completely understood and accpet all the possible adverse reactions/side effects.   Do not drive when taking Pain medications or sleeping medications (Benzodaizepines)  Do not take more than prescribed Pain, Sleep and Anxiety Medications. It is not advisable to combine anxiety,sleep and pain medications without talking with your primary care practitioner  Special Instructions: If you have smoked or chewed Tobacco  in the last 2 yrs please stop smoking, stop any regular Alcohol  and or any Recreational drug use.  Wear Seat belts while driving.  Please note: You were cared for by a hospitalist during your hospital stay. Once you are discharged, your primary care physician will handle any further medical issues. Please note that NO REFILLS for any discharge  medications will be authorized once you are discharged, as it is imperative that you return to your primary care physician (or establish a relationship with a primary care physician if you do not have one) for your post hospital discharge needs so that they can reassess your need for medications and monitor your lab values.   Increase activity slowly   Complete by: As directed       Allergies as of 12/27/2023   No Known Allergies      Medication List     STOP taking these medications    meloxicam  7.5 MG tablet Commonly known as: MOBIC        TAKE these medications    allopurinol  300 MG tablet Commonly known as: ZYLOPRIM  Take 300 mg by mouth daily.   ALPRAZolam  0.5 MG tablet Commonly known as: XANAX  Take 1 tablet (0.5 mg total) by mouth 2 (two) times daily as needed for anxiety. What changed:  medication strength how much to take when to take this reasons to take this additional instructions   amLODipine  5 MG tablet Commonly known as: NORVASC  Take 5 mg by mouth daily.   amoxicillin -clavulanate 875-125 MG tablet Commonly known as: AUGMENTIN  Take 1 tablet by mouth 2 (two) times daily for 2 days.   aspirin  81 MG chewable tablet Chew 1 tablet (81  mg total) by mouth 2 (two) times daily. What changed: when to take this   baclofen  20 MG tablet Commonly known as: LIORESAL  Take 20 mg by mouth 2 (two) times daily.   ezetimibe  10 MG tablet Commonly known as: ZETIA  Take 10 mg by mouth at bedtime.   FLUoxetine  40 MG capsule Commonly known as: PROZAC  Take 40 mg by mouth daily.   folic acid  1 MG tablet Commonly known as: FOLVITE  Take 1 mg by mouth daily.   gabapentin  300 MG capsule Commonly known as: NEURONTIN  Take 1 capsule (300 mg total) by mouth 2 (two) times daily.   guaifenesin  100 MG/5ML syrup Commonly known as: ROBITUSSIN Take 200 mg by mouth 3 (three) times daily as needed for cough or congestion.   OLANZapine  10 MG tablet Commonly known as:  ZYPREXA  Take 20 mg by mouth at bedtime.   Oxycodone  HCl 10 MG Tabs Take 1.5 tablets (15 mg total) by mouth See admin instructions. Take 15mg  (1 tablet) by mouth two to three times a day. What changed: medication strength   pantoprazole  40 MG tablet Commonly known as: PROTONIX  Take 40 mg by mouth daily.   polyethylene glycol 17 g packet Commonly known as: MIRALAX  / GLYCOLAX  Take 17 g by mouth 3 (three) times daily. What changed: when to take this   senna-docusate 8.6-50 MG tablet Commonly known as: Senokot-S Take 2 tablets by mouth at bedtime.   tamsulosin  0.4 MG Caps capsule Commonly known as: FLOMAX  Take 1 capsule (0.4 mg total) by mouth daily after supper. What changed: See the new instructions.   Ventolin  HFA 108 (90 Base) MCG/ACT inhaler Generic drug: albuterol  Inhale 1-2 puffs into the lungs every 4 (four) hours as needed for wheezing or shortness of breath.        Contact information for follow-up providers     Keren [name], MD. Schedule an appointment as soon as possible for a visit in 1 week.   Specialty: Internal Medicine Why: For follow-up Contact information: [address] [place] [address] [phone]         [facility] Emergency Department at [facility]. Go to .   Specialty: Emergency Medicine Why: As needed, If symptoms worsen Contact information: [address] [place]  [address] [phone]        [facility] Follow up.   Why: Someone from [facility] will contact you to arrange start date and time to resume your Home Health RN, physical therapy and occupational therapy. [facility] [address] information: [address] [place] [address] [phone]              Contact information for after-discharge care     Destination     [facility] .   Service: Skilled Nursing Contact information: [address] [place]   [phone]                    No Known Allergies   Other Procedures/Studies: DG Knee Complete 4 Views Right Result Date: 12/25/2023 CLINICAL DATA:  Right anterior knee pain. EXAM: RIGHT KNEE - COMPLETE 4+ VIEW COMPARISON:  05/27/2022 FINDINGS: Knee arthroplasty in expected alignment. No periprosthetic lucency. No acute or periprosthetic fracture. Quadriceps and patellar tendon enthesophytes. Increasing calcifications anterior to the distal femur which may represent heterotopic calcification. There is a chronic linear 4 mm foreign body within the anterior subcutaneous tissues, unchanged. IMPRESSION: 1. Knee arthroplasty without complication. 2. Increasing calcifications  anterior to the distal femur which may represent heterotopic calcification. 3. Chronic tiny foreign body in the anterior soft tissues. Electronically Signed   By: Andrea Gasman M.D.   On: 12/25/2023 16:34   DG Foot 2 Views Left Result Date: 12/24/2023 CLINICAL DATA:  Cellulitis. EXAM: LEFT FOOT - 2 VIEW COMPARISON:  None Available. FINDINGS: Advanced degenerative changes are seen in the MTP and IP joints of the great toe. Assessment of the second through fifth toes is markedly limited by superimposition of bony anatomy on both projections. No gross bony destruction to suggest osteomyelitis. IMPRESSION: 1. Advanced degenerative changes in the MTP and IP joints of the great toe. 2. No gross bony destruction to suggest osteomyelitis. MRI of the foot with and without contrast could be used for more sensitive evaluation as clinically warranted. Electronically Signed   By: Camellia Candle M.D.   On: 12/24/2023 07:43   DG Abd 1 View Result Date: 12/24/2023 CLINICAL DATA:  Constipation. EXAM: ABDOMEN - 1 VIEW COMPARISON:  No comparison studies available. FINDINGS: Diffuse gaseous distension of bowel noted in the abdomen and pelvis, mainly colon. No substantial stool volume. No rectal gas evident. Extensive spinal fusion hardware  evident in the visualized lower thoracic spine, lumbar spine, and sacrum. IMPRESSION: Diffuse gaseous distension of bowel in the abdomen and pelvis, mainly colon. No substantial stool volume. Findings could reflect ileus/dysmotility or distal colonic stricture/obstruction. Colitis also a consideration. Electronically Signed   By: Camellia Candle M.D.   On: 12/24/2023 07:10   CT HEAD WO CONTRAST ( ) Result Date: 12/24/2023 CLINICAL DATA:  68 year old male with weakness and altered mental status. EXAM: CT HEAD WITHOUT CONTRAST TECHNIQUE: Contiguous axial images were obtained from the base of the skull through the vertex without intravenous contrast. RADIATION DOSE REDUCTION: This exam was performed according to the departmental dose-optimization program which includes automated exposure control, adjustment of the mA and/or kV according to patient size and/or use of iterative reconstruction technique. COMPARISON:  Brain MRI 04/09/2022.  Head CT 08/09/2022. FINDINGS: Brain: Stable cerebral volume since 2023. No midline shift, ventriculomegaly, mass effect, evidence of mass lesion, intracranial hemorrhage or evidence of cortically based acute infarction. Gray-white differentiation within normal limits for age. Vascular: Calcified atherosclerosis at the skull base. No suspicious intracranial vascular hyperdensity. Skull: No acute osseous abnormality identified. Chronic retained rounded metal foreign body in the floor of the right orbit, unchanged and did Not result in significant susceptibility artifact on 2023 MRI. Sinuses/Orbits: Visualized paranasal sinuses and mastoids are stable and well aerated. Other: No acute orbit or scalp soft tissue finding. IMPRESSION: No acute intracranial abnormality. Stable since 2023 and negative for age noncontrast CT appearance of the brain. Electronically Signed   By: VEAR Hurst M.D.   On: 12/24/2023 07:03   DG Chest Port 1 View Result Date: 12/23/2023 CLINICAL DATA:  Cough and  weakness. EXAM: PORTABLE CHEST 1 VIEW COMPARISON:  Chest radiograph dated 10/05/2023. FINDINGS: Shallow inspiration and left lung base atelectasis. Pneumonia is less likely. No pleural effusion or pneumothorax. Stable cardiac silhouette. No acute osseous pathology. Spinal fusion hardware. IMPRESSION: Shallow inspiration and left lung base atelectasis. Electronically Signed   By: Vanetta Chou M.D.   On: 12/23/2023 19:30   DG Foot Complete Right Result Date: 12/02/2023 Please see detailed radiograph report in office note.    TODAY-DAY OF DISCHARGE:  Subjective:   Randall Mccormick today has no headache,no chest abdominal pain,no new weakness tingling or numbness, feels much better wants to go home today.  Objective:   Blood pressure 128/73, pulse 89, temperature 98.2 F (36.8 C), temperature source Oral, resp. rate 17, height 5' 8 (1.727 m), weight 99.8 kg, SpO2 90%.  Intake/Output Summary (Last 24 hours) at 12/27/2023 0854 Last data filed at 12/27/2023 0600 Gross per 24 hour  Intake 240 ml  Output 1200 ml  Net -960 ml   Filed Weights   12/23/23 1816  Weight: 99.8 kg    Exam: Awake Alert, Oriented *3, No new F.N deficits, Normal affect [place].AT,PERRAL Supple Neck,No JVD, No cervical lymphadenopathy appriciated.  Symmetrical Chest wall movement, Good air movement bilaterally, CTAB RRR,No Gallops,Rubs or new Murmurs, No Parasternal Heave +ve B.Sounds, Abd Soft, Non tender, No organomegaly appriciated, No rebound -guarding or rigidity. No Cyanosis, Clubbing or edema, No new Rash or bruise   PERTINENT RADIOLOGIC STUDIES: DG Knee Complete 4 Views Right Result Date: 12/25/2023 CLINICAL DATA:  Right anterior knee pain. EXAM: RIGHT KNEE - COMPLETE 4+ VIEW COMPARISON:  05/27/2022 FINDINGS: Knee arthroplasty in expected alignment. No periprosthetic lucency. No acute or periprosthetic fracture. Quadriceps and patellar tendon enthesophytes. Increasing calcifications anterior to the distal femur  which may represent heterotopic calcification. There is a chronic linear 4 mm foreign body within the anterior subcutaneous tissues, unchanged. IMPRESSION: 1. Knee arthroplasty without complication. 2. Increasing calcifications anterior to the distal femur which may represent heterotopic calcification. 3. Chronic tiny foreign body in the anterior soft tissues. Electronically Signed   By: Andrea Gasman M.D.   On: 12/25/2023 16:34     PERTINENT LAB RESULTS: CBC: Recent Labs    12/25/23 0600  WBC 9.3  HGB 14.9  HCT 46.6  PLT 212   CMET CMP     Component Value Date/Time   NA 141 12/26/2023 0739   K 3.8 12/26/2023 0739   CL 101 12/26/2023 0739   CO2 29 12/26/2023 0739   GLUCOSE 90 12/26/2023 0739   BUN 8 12/26/2023 0739   CREATININE 0.67 12/26/2023 0739   CALCIUM  9.1 12/26/2023 0739   PROT 6.2 (L) 12/25/2023 0600   ALBUMIN 2.7 (L) 12/25/2023 0600   AST 22 12/25/2023 0600   ALT 21 12/25/2023 0600   ALKPHOS 83 12/25/2023 0600   BILITOT 1.2 12/25/2023 0600   GFRNONAA >60 12/26/2023 0739    GFR Estimated Creatinine Clearance: 101.3 mL/min (by C-G formula based on SCr of 0.67 mg/dL). No results for input(s): LIPASE, AMYLASE in the last 72 hours. No results for input(s): CKTOTAL, CKMB, CKMBINDEX, TROPONINI in the last 72 hours. Invalid input(s): POCBNP No results for input(s): DDIMER in the last 72 hours. No results for input(s): HGBA1C in the last 72 hours. No results for input(s): CHOL, HDL, LDLCALC, TRIG, CHOLHDL, LDLDIRECT in the last 72 hours. No results for input(s): TSH, T4TOTAL, T3FREE, THYROIDAB in the last 72 hours.  Invalid input(s): FREET3 No results for input(s): VITAMINB12, FOLATE, FERRITIN, TIBC, IRON, RETICCTPCT in the last 72 hours. Coags: No results for input(s): INR in the last 72 hours.  Invalid input(s): PT Microbiology: Recent Results (from the past 240 hours)  Resp panel by RT-PCR (RSV, Flu  A&B, Covid) Anterior Nasal Swab     Status: None   Collection Time: 12/23/23  6:21 PM   Specimen: Anterior Nasal Swab  Result Value Ref Range Status   SARS Coronavirus 2 by RT PCR NEGATIVE NEGATIVE Final   Influenza A by PCR NEGATIVE NEGATIVE Final   Influenza B by PCR NEGATIVE NEGATIVE Final    Comment: (NOTE) The Xpert Xpress SARS-CoV-2/FLU/RSV plus  assay is intended as an aid in the diagnosis of influenza from Nasopharyngeal swab specimens and should not be used as a sole basis for treatment. Nasal washings and aspirates are unacceptable for Xpert Xpress SARS-CoV-2/FLU/RSV testing.  Fact Sheet for Patients: BloggerCourse.com  Fact Sheet for Healthcare Providers: SeriousBroker.it  This test is not yet approved or cleared by the [place]  FDA and has been authorized for detection and/or diagnosis of SARS-CoV-2 by FDA under an Emergency Use Authorization (EUA). This EUA will remain in effect (meaning this test can be used) for the duration of the COVID-19 declaration under Section 564(b)(1) of the Act, 21 U.S.C. section 360bbb-3(b)(1), unless the authorization is terminated or revoked.     Resp Syncytial Virus by PCR NEGATIVE NEGATIVE Final    Comment: (NOTE) Fact Sheet for Patients: BloggerCourse.com  Fact Sheet for Healthcare Providers: SeriousBroker.it  This test is not yet approved or cleared by the [place]  FDA and has been authorized for detection and/or diagnosis of SARS-CoV-2 by FDA under an Emergency Use Authorization (EUA). This EUA will remain in effect (meaning this test can be used) for the duration of the COVID-19 declaration under Section 564(b)(1) of the Act, 21 U.S.C. section 360bbb-3(b)(1), unless the authorization is terminated or revoked.  Performed at [facility], [address], [place], [place] [address]   Culture, blood (Routine X 2)  w Reflex to ID Panel     Status: None (Preliminary result)   Collection Time: 12/24/23  8:23 AM   Specimen: BLOOD  Result Value Ref Range Status   Specimen Description BLOOD SITE NOT SPECIFIED  Final   Special Requests   Final    BOTTLES DRAWN AEROBIC AND ANAEROBIC Blood Culture adequate volume   Culture   Final    NO GROWTH 3 DAYS Performed at [facility], [address] [place], [place] [address]    Report Status PENDING  Incomplete  Culture, blood (Routine X 2) w Reflex to ID Panel     Status: None (Preliminary result)   Collection Time: 12/24/23  8:59 AM   Specimen: BLOOD LEFT HAND  Result Value Ref Range Status   Specimen Description BLOOD LEFT HAND  Final   Special Requests   Final    BOTTLES DRAWN AEROBIC AND ANAEROBIC Blood Culture results may not be optimal due to an inadequate volume of blood received in culture bottles   Culture   Final    NO GROWTH 3 DAYS Performed at [facility], [address], [place], [place] [address]    Report Status PENDING  Incomplete    FURTHER DISCHARGE INSTRUCTIONS:  Get Medicines reviewed and adjusted: Please take all your medications with you for your next visit with your Primary MD  Laboratory/radiological data: Please request your Primary MD to go over all hospital tests and procedure/radiological results at the follow up, please ask your Primary MD to get all Hospital records sent to his/her office.  In some cases, they will be blood work, cultures and biopsy results pending at the time of your discharge. Please request that your primary care M.D. goes through all the records of your hospital data and follows up on these results.  Also Note the following: If you experience worsening of your admission symptoms, develop shortness of breath, life threatening emergency, suicidal or homicidal thoughts you must seek medical attention immediately by calling 911 or calling your MD immediately  if symptoms less severe.  You  must read complete instructions/literature  along with all the possible adverse reactions/side effects for all the Medicines you take and that have been prescribed to you. Take any new Medicines after you have completely understood and accpet all the possible adverse reactions/side effects.   Do not drive when taking Pain medications or sleeping medications (Benzodaizepines)  Do not take more than prescribed Pain, Sleep and Anxiety Medications. It is not advisable to combine anxiety,sleep and pain medications without talking with your primary care practitioner  Special Instructions: If you have smoked or chewed Tobacco  in the last 2 yrs please stop smoking, stop any regular Alcohol  and or any Recreational drug use.  Wear Seat belts while driving.  Please note: You were cared for by a hospitalist during your hospital stay. Once you are discharged, your primary care physician will handle any further medical issues. Please note that NO REFILLS for any discharge medications will be authorized once you are discharged, as it is imperative that you return to your primary care physician (or establish a relationship with a primary care physician if you do not have one) for your post hospital discharge needs so that they can reassess your need for medications and monitor your lab values.  Total Time spent coordinating discharge including counseling, education and face to face time equals greater than 30 minutes.  SignedBETHA Donalda Applebaum 12/27/2023 8:54 AM

## 2023-12-27 NOTE — TOC Transition Note (Signed)
 Transition of Care [facility]) - Discharge Note   Patient Details  Name: Randall Mccormick MRN: [id number] Date of Birth: [birth year]  Transition of Care [facility]) CM/SW Contact:  Dino CHRISTELLA Au, LCSWA Phone Number: 12/27/2023, 10:21 AM   Clinical Narrative:     SW spoke with [name] ([facility] [phone]) confirmed able to accept today. Call report: [phone]  SW attempted to call pt wife [name] [phone]), straight to VM, will cont attempts.   Per chart, family is transporting.  Final next level of care: Skilled Nursing Facility Barriers to Discharge: Barriers Resolved   Patient Goals and CMS Choice Patient states their goals for this hospitalization and ongoing recovery are:: Rehab CMS Medicare.gov Compare Post Acute Care list provided to:: Patient Represenative (must comment) Choice offered to / list presented to : Spouse [place] ownership interest in [facility].provided to:: Spouse    Discharge Placement                Patient to be transferred to facility by: family   Patient and family notified of of transfer: 12/27/23  Discharge Plan and Services Additional resources added to the After Visit Summary for   In-house Referral: Clinical Social Work   Post Acute Care Choice: Skilled Nursing Facility                               Social Drivers of Health (SDOH) Interventions SDOH Screenings   Food Insecurity: No Food Insecurity (12/24/2023)  Housing: Low Risk  (12/24/2023)  Transportation Needs: No Transportation Needs (12/24/2023)  Utilities: Not At Risk (12/24/2023)  Alcohol Screen: Low Risk  (09/12/2022)  Depression (PHQ2-9): Low Risk  (09/12/2022)  Financial Resource Strain: Low Risk  (09/13/2022)  Physical Activity: Inactive (09/13/2022)  Social Connections: Moderately Integrated (12/24/2023)  Stress: Stress Concern Present (09/13/2022)  Tobacco Use: Medium Risk (12/24/2023)     Readmission Risk Interventions     No data to display

## 2023-12-27 NOTE — Progress Notes (Signed)
 Report given to LoryLPN at  [employer] reflecting patient's current status

## 2023-12-29 LAB — CULTURE, BLOOD (ROUTINE X 2)
Culture: NO GROWTH
Culture: NO GROWTH
Special Requests: ADEQUATE

## 2023-12-29 NOTE — Care Management Important Message (Signed)
 Important Message  Patient Details  Name: Randall Mccormick MRN: [id number] Date of Birth: February 04, 1956   Important Message Given:  Yes - Medicare IM Patient left prior to IM delivery will mail to the patient home address.     Ezriel Boffa 12/29/2023, 9:07 AM

## 2024-03-09 ENCOUNTER — Ambulatory Visit (INDEPENDENT_AMBULATORY_CARE_PROVIDER_SITE_OTHER): Payer: MEDICARE | Admitting: Podiatry

## 2024-03-09 DIAGNOSIS — B351 Tinea unguium: Secondary | ICD-10-CM | POA: Diagnosis not present

## 2024-03-09 DIAGNOSIS — M79675 Pain in left toe(s): Secondary | ICD-10-CM

## 2024-03-09 DIAGNOSIS — M2041 Other hammer toe(s) (acquired), right foot: Secondary | ICD-10-CM

## 2024-03-09 DIAGNOSIS — M2042 Other hammer toe(s) (acquired), left foot: Secondary | ICD-10-CM

## 2024-03-09 DIAGNOSIS — M79674 Pain in right toe(s): Secondary | ICD-10-CM

## 2024-03-09 DIAGNOSIS — G5793 Unspecified mononeuropathy of bilateral lower limbs: Secondary | ICD-10-CM | POA: Diagnosis not present

## 2024-03-09 NOTE — Patient Instructions (Signed)
 More silicone pads can be purchased from:  [email or web address]  Look for crest pads.  These are the types of pads that can protect your hammertoes.  You can also find them on [employer].

## 2024-03-09 NOTE — Progress Notes (Unsigned)
 Tics today

## 2024-03-09 NOTE — Progress Notes (Unsigned)
 Chief Complaint  Patient presents with   RFC    RFC  R 2nd toe end plantar has been sore x 1 yr.  Not diabetic No anti coag  Has open wound on lower R leg culture was done 03/05/24 came back positive for staff started antibiotics today    HPI: 68 y.o. male presents today for high risk footcare.  He has painful, thickened, elongated dystrophic toenails that he is unable to maintain himself due to their thickness and length as well as mobility issues.  He mostly uses a wheelchair.  Has significant neuromuscular disorder with lumbar and lumbosacral vertebral disc degeneration.  Today he is also complaining of significant pain to the right second toe.  He does have spastic hammertoe contractures bilaterally.  Of note, he is currently being treated for an infected hematoma which he recently had drained right leg.  There is compressive bandage applied which was left intact.  Past Medical History:  Diagnosis Date   Anxiety    Cobalamin deficiency    Degeneration of lumbar or lumbosacral intervertebral disc    Depression    Gouty arthropathy, unspecified    Hemorrhoids    Hyperlipidemia    Hypertension    Multiple fractures of ribs of left side 04/20/2013   FALL FROM A LADDER ON    Vitamin D deficiency     Past Surgical History:  Procedure Laterality Date   APPENDECTOMY     BACK SURGERY     x 6   CHOLECYSTECTOMY  06/24/2008   [facility]   ESOPHAGOGASTRODUODENOSCOPY N/A 04/06/2022   Procedure: ESOPHAGOGASTRODUODENOSCOPY (EGD);  Surgeon: Legrand Victory LITTIE DOUGLAS, MD;  Location: [facility] ENDOSCOPY;  Service: Gastroenterology;  Laterality: N/A;   TONSILLECTOMY     TOTAL KNEE ARTHROPLASTY Right 03/28/2022   Procedure: RIGHT TOTAL KNEE ARTHROPLASTY;  Surgeon: Addie Cordella Hamilton, MD;  Location: [facility] OR;  Service: Orthopedics;  Laterality: Right;    No Known Allergies  ROS    Physical Exam: There were no vitals filed for this visit.  General: The patient is alert and oriented x3 in no  acute distress.  Dermatology: Nailplates x 9 are thickened, elongated, dystrophic with yellow discoloration and subungual debris.  They are tender on direct dorsal palpation.  Right hallux prior total nail avulsion site doing well with some spicule formation which was trimmed today.  No preulcerative calluses today.  Dressings to right lower leg left intact at site of hematoma.   Vascular: Faintly palpable pedal pulses, difficult to palpate due to edema.  Capillary refill intact to the digits less than 3 seconds.  Scant pedal hair growth.  Diffuse lower extremity edema and forefoot edema, +2 to +3 pitting.  Neurological: Light touch sensation grossly decreased to the toes.  Protective sensation decreased.  Musculoskeletal Exam: Decreased muscle strength to lower extremities bilaterally.  Extensive forefoot pedal deformity bilaterally.  There are hammertoe deformities right greater than left foot that do seem spastic, they are difficult to reduce but can be brought in to near rectus position.  Pain on palpation of right second toe distally.  Assessment/Plan of Care: 1. Pain due to onychomycosis of toenails of both feet   2. Hammer toes of both feet   3. Neuropathy involving both lower extremities      No orders of the defined types were placed in this encounter.  None  Discussed clinical findings with patient today.  #Onychomycosis with pain  -Nails palliatively debrided as below. -Educated on  self-care - At risk footcare due to debility, neuropathy  Procedure: Nail Debridement Rationale: Pain Type of Debridement: manual, sharp debridement. Instrumentation: Nail nipper, rotary burr. Number of Nails: 10  # Hammertoes of both feet - Right second toe significantly painful at the tip of the toe - Can be brought into rectus position - He gets limited relief from crest pads - We did discuss flexor tenotomy.  Do think that this would be reasonable to try and improve the alignment of the  toe and decrease pressure when he does weight-bear on the tip of the toe - Risk, benefits alternative therapies were discussed at length.  Potential risks of the procedure include wound healing complications, infection, need for amputation, continued deformity, inadequate correction, recurrence - He is interested in proceeding with this.  We will have him follow-up in about a month to proceed with this. -I certify that this diagnosis represents a distinct and separate diagnosis that requires evaluation and treatment separate from other procedures or diagnosis    Mikell Camp L. Lamount MAUL, AACFAS [facility]     [address] [place], [place] [address]                Office [phone]  Fax [phone]

## 2024-03-12 ENCOUNTER — Encounter: Payer: Self-pay | Admitting: Podiatry

## 2024-03-22 DIAGNOSIS — M5441 Lumbago with sciatica, right side: Secondary | ICD-10-CM | POA: Insufficient documentation

## 2024-03-22 DIAGNOSIS — M5442 Lumbago with sciatica, left side: Secondary | ICD-10-CM | POA: Insufficient documentation

## 2024-03-22 DIAGNOSIS — M5430 Sciatica, unspecified side: Secondary | ICD-10-CM | POA: Insufficient documentation

## 2024-03-23 ENCOUNTER — Ambulatory Visit: Payer: MEDICARE

## 2024-03-23 VITALS — BP 140/64 | HR 68 | Ht 68.0 in | Wt 219.2 lb

## 2024-03-23 DIAGNOSIS — I10 Essential (primary) hypertension: Secondary | ICD-10-CM | POA: Diagnosis present

## 2024-03-23 DIAGNOSIS — R0602 Shortness of breath: Secondary | ICD-10-CM | POA: Diagnosis present

## 2024-03-23 DIAGNOSIS — E782 Mixed hyperlipidemia: Secondary | ICD-10-CM | POA: Diagnosis not present

## 2024-03-23 DIAGNOSIS — I340 Nonrheumatic mitral (valve) insufficiency: Secondary | ICD-10-CM | POA: Insufficient documentation

## 2024-03-23 DIAGNOSIS — Z789 Other specified health status: Secondary | ICD-10-CM | POA: Insufficient documentation

## 2024-03-23 HISTORY — DX: Nonrheumatic mitral (valve) insufficiency: I34.0

## 2024-03-23 HISTORY — DX: Other specified health status: Z78.9

## 2024-03-23 MED ORDER — ROSUVASTATIN CALCIUM 5 MG PO TABS: 5.0000 mg | ORAL_TABLET | Freq: Every day | ORAL | 3 refills | Status: DC

## 2024-03-23 NOTE — Assessment & Plan Note (Signed)
 Advised about harmful effects of alcohol consumption. Recommended to decrease the intake to no more than 1 or 2 drinks on any day and no more than 2-3 times a week.

## 2024-03-23 NOTE — Assessment & Plan Note (Signed)
 Moderate to severe mitral regurgitation reported on echocardiogram May 2025 at pulmonary clinic. Associated with moderate TR. Images not available to review myself.  [Prior echocardiogram at [facility] from October 2023 was a limited quality study; an earlier echocardiogram from 2018 at [facility] reported mild MR and mild TR].  Will reassess with repeat echocardiogram tentatively in 3 months.  Advised to continue with low-salt diet. Monitor for any worsening of bilateral lower extremity edema or rapid weight gain, shortness of breath or chest pain.

## 2024-03-23 NOTE — Assessment & Plan Note (Signed)
 Lipid panel from 03/01/2024 reviewed. Continue Zetia  10 mg once daily. Target LDL less than 70 mg/dL. Start Crestor 5 mg once daily. Repeat fasting lipid panel and CMP tentatively prior to next follow-up visit in 3 months.

## 2024-03-23 NOTE — Assessment & Plan Note (Signed)
 Blood pressure suboptimal today. Mentions typically well-controlled. Remains on amlodipine  2.5 mg once daily Continue current dose and continued monitoring at home. If blood pressures consistently above 130/80 mmHg would recommend titrating up amlodipine  dose to 5 mg once daily.

## 2024-03-23 NOTE — Patient Instructions (Signed)
 Medication Instructions:  Your physician has recommended you make the following change in your medication:   Start Crestor 5 mg daily.  *If you need a refill on your cardiac medications before your next appointment, please call your pharmacy*   Lab Work: Your physician recommends that you return for lab work in: 3 months for fasting lipids, BNP and CMP You need to have labs done when you are fasting.  You can come Monday through Friday 8:30 am to 12:00 pm and 1:15 to 4:30. You do not need to make an appointment as the order has already been placed.   If you have labs (blood work) drawn today and your tests are completely normal, you will receive your results only by: MyChart Message (if you have MyChart) OR A paper copy in the mail If you have any lab test that is abnormal or we need to change your treatment, we will call you to review the results.   Testing/Procedures: Your physician has requested that you have an echocardiogram in 3 months. Echocardiography is a painless test that uses sound waves to create images of your heart. It provides your doctor with information about the size and shape of your heart and how well your heart's chambers and valves are working. This procedure takes approximately one hour. There are no restrictions for this procedure. Please do NOT wear cologne, perfume, aftershave, or lotions (deodorant is allowed). Please arrive 15 minutes prior to your appointment time.  Please note: We ask at that you not bring children with you during ultrasound (echo/ vascular) testing. Due to room size and safety concerns, children are not allowed in the ultrasound rooms during exams. Our front office staff cannot provide observation of children in our lobby area while testing is being conducted. An adult accompanying a patient to their appointment will only be allowed in the ultrasound room at the discretion of the ultrasound technician under special circumstances. We apologize  for any inconvenience.    Follow-Up: At [facility], you and your health needs are our priority.  As part of our continuing mission to provide you with exceptional heart care, we have created designated Provider Care Teams.  These Care Teams include your primary Cardiologist (physician) and Advanced Practice Providers (APPs -  Physician Assistants and Nurse Practitioners) who all work together to provide you with the care you need, when you need it.  We recommend signing up for the patient portal called MyChart.  Sign up information is provided on this After Visit Summary.  MyChart is used to connect with patients for Virtual Visits (Telemedicine).  Patients are able to view lab/test results, encounter notes, upcoming appointments, etc.  Non-urgent messages can be sent to your provider as well.   To learn more about what you can do with MyChart, go to [email or web address].    Your next appointment:   3 month(s)  The format for your next appointment:   In Person  Provider:   Alean Kobus, MD    Other Instructions  Decrease your alcohol consumption.  Important Information About Sugar

## 2024-03-23 NOTE — Progress Notes (Signed)
 Cardiology Consultation:    Date:  03/23/2024   ID:  Randall Mccormick, DOB 05-18-1956, MRN [id number]  PCP:  Keren [name], MD  Cardiologist:  Alean SAUNDERS Fain Francis, MD   Referring MD: Keren [name], MD   No chief complaint on file.    ASSESSMENT AND PLAN:   Mr. Boehle 68 year old male with history of  coronary atherosclerosis [on CT chest April 2025 at [facility], hypertension, hyperlipidemia, chronic back pain [multiple surgeries, most recent 2 years ago], bilateral lower extremity weakness leading to functional limitations, asthma/COPD, OSA, moderate alcohol consumption [up to 4 drinks a day]   Here for further evaluation of abnormal echocardiogram findings May 2025 reporting moderate to severe MR and moderate TR at [facility] health pulmonary office.  Problem List Items Addressed This Visit     Hyperlipidemia   Lipid panel from 03/01/2024 reviewed. Continue Zetia  10 mg once daily. Target LDL less than 70 mg/dL. Start Crestor 5 mg once daily. Repeat fasting lipid panel and CMP tentatively prior to next follow-up visit in 3 months.       Hypertension   Blood pressure suboptimal today. Mentions typically well-controlled. Remains on amlodipine  2.5 mg once daily Continue current dose and continued monitoring at home. If blood pressures consistently above 130/80 mmHg would recommend titrating up amlodipine  dose to 5 mg once daily.        Relevant Orders   EKG 12-Lead (Completed)   Mitral regurgitation - Primary   Moderate to severe mitral regurgitation reported on echocardiogram May 2025 at pulmonary clinic. Associated with moderate TR. Images not available to review myself.  [Prior echocardiogram at [facility] health from October 2023 was a limited quality study; an earlier echocardiogram from 2018 at [facility] reported mild MR and mild TR].  Will reassess with repeat echocardiogram tentatively in 3 months.  Advised to continue with low-salt  diet. Monitor for any worsening of bilateral lower extremity edema or rapid weight gain, shortness of breath or chest pain.         Moderate alcohol consumption   Advised about harmful effects of alcohol consumption. Recommended to decrease the intake to no more than 1 or 2 drinks on any day and no more than 2-3 times a week.       Return to clinic tentatively in 3 months.   History of Present Illness:    Randall Mccormick is a 68 y.o. male who is being seen today for the evaluation of mitral and tricuspid regurgitation on echocardiogram at pulmonary clinic at the request of Keren [name], MD.  Here for the visit today accompanied by his wife. At home lives with his wife.  Functional status is  significantly limited due to chronic back pain and reduced lower extremity strength and uses wheelchair to ambulate outdoors and in the house able to move about for limited distances using walker.  Has history of coronary atherosclerosis [on CT chest April 2025 at [facility], hypertension, hyperlipidemia, chronic back pain [multiple surgeries, most recent 2 years ago], bilateral lower extremity weakness leading to functional limitations, asthma/COPD, OSA, moderate alcohol consumption [up to 4 drinks a day]  Echocardiogram report dated 11/18/2023 at [facility] pulmonary and sleep clinic reported left ventricle mildly dilated EF 60%, left atrium moderately dilated, moderate to severe MR, moderate tricuspid regurgitation.  Images not available to review myself.  Prior echocardiogram report from October 2023 at [facility] reported LVEF 70 to 75%, hyperdynamic, limited study technically difficult, no significant valve dysfunction  reported.  In comparison prior echocardiogram report from 2018 at [facility] reported mild MR and mild TR.  EKG in the clinic today shows sinus rhythm heart rate 68/min, PR interval 168 ms, QRS duration 96 ms, right axis deviation.  No ischemic  changes.  Mentions from cardiac standpoint no active symptoms of chest pain. Functional status is limited however denies any significant shortness of breath. Has bilateral lower extremity edema dependent type. Sustained a hematoma on the right lower extremity and bruising of the left lower extremity from injuries over the last few weeks and is being treated with wound dressing.  Compliant with his medications. Currently not on statins but not aware of any prior abnormal reactions to statins.  Blood work available from 03/01/2024 reviewed notes total cholesterol 182, HDL 49, LDL 117, triglycerides 87. Hemoglobin 15.7 Creatinine 0.88, eGFR 94. TSH was normal 0.890 on 12/24/2023.   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) 03/29/2022   Aspiration pneumonia (HCC) 04/12/2022   BMI 35.0-35.9,adult 03/02/2014   Chronic pain 12/24/2023   Chronic pain syndrome    Cobalamin deficiency    Constipation    Cord compression (HCC) 07/20/2020   Degeneration of lumbar or lumbosacral intervertebral disc    Degenerative lumbar spinal stenosis 11/23/2019   Depression    Difficulty walking 12/11/2022   Disorder of spinal region (HCC) 12/11/2022   Displacement of lumbar intervertebral disc without myelopathy 03/02/2014   Dupuytren's contracture of right hand 03/17/2019   Foot-drop 12/11/2022   Gouty arthropathy    IMO SNOMED Dx Update Oct 2024     Hematemesis with nausea    Hemorrhoids    Hyperkalemia    Hyperlipidemia    Hypertension    Hypoalbuminemia due to protein-calorie malnutrition    Incomplete paraplegia (HCC) 09/08/2020   Low back pain 05/12/2023   Lumbago  with sciatica, left side 03/22/2024   Lumbago with sciatica, right side 03/22/2024   Lumbar spondylosis 11/03/2013   Multiple fractures of ribs of left side 04/20/2013   FALL FROM A LADDER ON    Muscle atrophy 12/11/2022   Neurogenic bladder    Neurogenic bowel    Neuropathic pain    OA (osteoarthritis) of knee 03/28/2022   Osteoarthritis of right hip 01/07/2014   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/24/2011   Pneumonia 12/24/2023   Pressure injury of skin 04/09/2022   Right hip pain 01/07/2014   S/P spinal fusion 07/20/2020   Sacroiliac dysfunction 11/03/2013   Sciatica 03/22/2024   Sepsis (HCC) 09/11/2023   Septic shock (HCC)    Spasticity 09/08/2020   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/2020   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/2020   Uremia 04/07/2022   Urinary retention 03/28/2021   Vitamin D deficiency    Weakness 12/24/2023   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/08/2020    Past Surgical History:  Procedure Laterality Date   APPENDECTOMY     BACK SURGERY     x 6   CHOLECYSTECTOMY  06/24/2008   [facility]   ESOPHAGOGASTRODUODENOSCOPY N/A 04/06/2022   Procedure: ESOPHAGOGASTRODUODENOSCOPY (EGD);  Surgeon: Legrand Victory LITTIE DOUGLAS, MD;  Location:  [facility];  Service: Gastroenterology;  Laterality: N/A;   TONSILLECTOMY     TOTAL KNEE ARTHROPLASTY Right 03/28/2022   Procedure: RIGHT TOTAL KNEE ARTHROPLASTY;  Surgeon: Addie Cordella Hamilton, MD;  Location: [facility] OR;  Service: Orthopedics;  Laterality: Right;    Current Medications: Current Meds  Medication Sig   albuterol  (PROVENTIL ) (2.5 MG/3ML) 0.083% nebulizer solution Take 2.5 mg by nebulization every 6 (six) hours as needed for wheezing or shortness of breath.   allopurinol  (ZYLOPRIM ) 100 MG tablet Take 100 mg by mouth daily.   ALPRAZolam  (XANAX ) 1 MG tablet Take 1 mg by mouth 3 (three) times daily as needed for anxiety.   amLODipine  (NORVASC ) 2.5 MG tablet Take 2.5 mg by mouth  daily.   aspirin  EC 81 MG tablet Take 81 mg by mouth daily. Swallow whole.   Baclofen  5 MG TABS Take 5 mg by mouth daily.   ezetimibe  (ZETIA ) 10 MG tablet Take 10 mg by mouth at bedtime.   FLUoxetine  (PROZAC ) 10 MG capsule Take 10 mg by mouth daily.   fluticasone (FLONASE) 50 MCG/ACT nasal spray Place 1 spray into both nostrils 2 (two) times daily.   folic acid  (FOLVITE ) 1 MG tablet Take 1 mg by mouth daily.   gabapentin  (NEURONTIN ) 100 MG capsule Take 100 mg by mouth 3 (three) times daily.   meloxicam  (MOBIC ) 7.5 MG tablet Take 7.5 mg by mouth daily.   OLANZapine  (ZYPREXA ) 2.5 MG tablet Take 2.5 mg by mouth at bedtime.   oxycodone  (OXY-IR) 5 MG capsule Take 5 mg by mouth every 4 (four) hours as needed for pain.   pantoprazole  (PROTONIX ) 40 MG tablet Take 40 mg by mouth daily.   polyethylene glycol (MIRALAX  / GLYCOLAX ) 17 g packet Take 17 g by mouth 3 (three) times daily.   tamsulosin  (FLOMAX ) 0.4 MG CAPS capsule Take 1 capsule (0.4 mg total) by mouth daily after supper.   VENTOLIN  HFA 108 (90 Base) MCG/ACT inhaler Inhale 1-2 puffs into the lungs every 4 (four) hours as needed for wheezing or shortness of breath.     Allergies:   Patient has no known allergies.   Social History   Socioeconomic History   Marital status: Married    Spouse name: [name]   Number of children: 1   Years of education: Not on file   Highest education level: Not on file  Occupational History   Not on file  Tobacco Use   Smoking status: Never   Smokeless tobacco: Former    Types: [place]    Quit date: 2022  Vaping Use   Vaping status: Never Used  Substance and Sexual Activity   Alcohol use: Yes    Alcohol/week: 1.0 standard drink of alcohol    Types: 1 Shots of liquor per week    Comment: 2 per night   Drug use: Not Currently   Sexual activity: Not Currently  Other Topics Concern   Not on file  Social History Narrative   Right Handed   1 Cup of Coffee   Social Drivers of Health   Financial  Resource Strain: Low Risk  (09/13/2022)   Overall Financial Resource Strain (CARDIA)    Difficulty of Paying Living Expenses: Not hard at all  Food Insecurity: No Food Insecurity (12/24/2023)   Hunger Vital Sign    Worried About Running Out of Food in the Last Year: Never true    Ran Out of Food in the Last Year: Never true  Transportation Needs: No Transportation Needs (12/24/2023)  PRAPARE - Administrator, Civil Service (Medical): No    Lack of Transportation (Non-Medical): No  Physical Activity: Inactive (09/13/2022)   Exercise Vital Sign    Days of Exercise per Week: 0 days    Minutes of Exercise per Session: 0 min  Stress: Stress Concern Present (09/13/2022)   [employer] of Occupational Health - Occupational Stress Questionnaire    Feeling of Stress : Rather much  Social Connections: Moderately Integrated (12/24/2023)   Social Connection and Isolation Panel    Frequency of Communication with Friends and Family: Once a week    Frequency of Social Gatherings with Friends and Family: Once a week    Attends Religious Services: 1 to 4 times per year    Active Member of [employer] or Organizations: No    Attends Engineer, structural: 1 to 4 times per year    Marital Status: Married     Family History: The patient's family history includes Alcoholism in his father; Cancer in his mother and son; Heart disease in his father. ROS:   Please see the history of present illness.    All 14 point review of systems negative except as described per history of present illness.  EKGs/Labs/Other Studies Reviewed:    The following studies were reviewed today:   EKG:  EKG Interpretation Date/Time:  Tuesday March 23 2024 13:51:15 EDT Ventricular Rate:  68 PR Interval:  168 QRS Duration:  96 QT Interval:  410 QTC Calculation: 435 R Axis:   127  Text Interpretation: Normal sinus rhythm Right axis deviation When compared with ECG of 23-Dec-2023 18:26, PREVIOUS ECG IS  PRESENT Confirmed by Liborio Hai reddy [phone]) on 03/23/2024 2:27:51 PM    Recent Labs: 12/23/2023: B Natriuretic Peptide 8.4 12/24/2023: TSH 0.894 12/25/2023: ALT 21; Hemoglobin 14.9; Platelets 212 12/26/2023: BUN 8; Creatinine, Ser 0.67; Magnesium  2.0; Potassium 3.8; Sodium 141  Recent Lipid Panel    Component Value Date/Time   TRIG 217 (H) 04/07/2022 0516    Physical Exam:    VS:  BP (!) 140/64   Pulse 68   Ht 5' 8 (1.727 m)   Wt 219 lb 3.2 oz (99.4 kg)   SpO2 90%   BMI 33.33 kg/m     Wt Readings from Last 3 Encounters:  03/23/24 219 lb 3.2 oz (99.4 kg)  03/15/24 215 lb (97.5 kg)  12/23/23 220 lb (99.8 kg)     GENERAL:  Well nourished, well developed in no acute distress NECK: No JVD; No carotid bruits CARDIAC: RRR, S1 and S2 present, no murmurs, no rubs, no gallops CHEST:  Clear to auscultation without rales, wheezing or rhonchi  Extremities: Bilateral 2+ pitting pedal edema. Pulses bilaterally symmetric with radial 2+ and dorsalis pedis 2+ NEUROLOGIC:  Alert and oriented x 3  Medication Adjustments/Labs and Tests Ordered: Current medicines are reviewed at length with the patient today.  Concerns regarding medicines are outlined above.  Orders Placed This Encounter  Procedures   EKG 12-Lead   No orders of the defined types were placed in this encounter.   Signed, Baudelia Schroepfer reddy Jorryn Casagrande, MD, MPH, [facility]. 03/23/2024 2:36 PM    [facility]

## 2024-03-31 ENCOUNTER — Telehealth: Payer: Self-pay

## 2024-03-31 NOTE — Telephone Encounter (Signed)
Sent via EPIC fax

## 2024-03-31 NOTE — Telephone Encounter (Signed)
 Tamy from Dr. Lonnell office requesting to get copy of the patient OV notes. She gave fax number [phone]

## 2024-04-01 ENCOUNTER — Encounter: Payer: Self-pay | Admitting: Podiatry

## 2024-04-01 ENCOUNTER — Ambulatory Visit: Payer: MEDICARE | Admitting: Podiatry

## 2024-04-01 DIAGNOSIS — L97522 Non-pressure chronic ulcer of other part of left foot with fat layer exposed: Secondary | ICD-10-CM

## 2024-04-01 DIAGNOSIS — M2041 Other hammer toe(s) (acquired), right foot: Secondary | ICD-10-CM

## 2024-04-01 DIAGNOSIS — G5793 Unspecified mononeuropathy of bilateral lower limbs: Secondary | ICD-10-CM

## 2024-04-01 DIAGNOSIS — M2042 Other hammer toe(s) (acquired), left foot: Secondary | ICD-10-CM

## 2024-04-01 MED ORDER — MUPIROCIN 2 % EX OINT: 1.0000 | TOPICAL_OINTMENT | Freq: Two times a day (BID) | CUTANEOUS | 2 refills | Status: DC

## 2024-04-01 MED ORDER — DOXYCYCLINE HYCLATE 100 MG PO TABS: 100.0000 mg | ORAL_TABLET | Freq: Two times a day (BID) | ORAL | 0 refills | Status: AC

## 2024-04-01 NOTE — Progress Notes (Signed)
 Subjective:  Patient ID: Randall Mccormick, male    DOB: 09-18-1955,  MRN: [id number]  68 y.o. male is seen today for evaluation of left first toe hemorrhagic callus with concern for ulceration.  Original plan for scheduled flexor tenotomy of right 2nd and 3rd toe however noted to have underlying ulceration causing extra concern.  He does have spastic digital deformities from neurological issues and reports minimal ambulation.  Nondiabetic.   Patient is using walker and regular shoe gear.  Does often use a wheelchair.   Patient The patient is having no constitutional symptoms, denying fever, chills, anorexia, or weight loss.  Does have baseline shortness of breath.     Current Outpatient Medications on File Prior to Visit  Medication Sig Dispense Refill   albuterol  (PROVENTIL ) (2.5 MG/3ML) 0.083% nebulizer solution Take 2.5 mg by nebulization every 6 (six) hours as needed for wheezing or shortness of breath.     allopurinol  (ZYLOPRIM ) 100 MG tablet Take 100 mg by mouth daily.     ALPRAZolam  (XANAX ) 1 MG tablet Take 1 mg by mouth 3 (three) times daily as needed for anxiety.     amLODipine  (NORVASC ) 2.5 MG tablet Take 2.5 mg by mouth daily.     aspirin  EC 81 MG tablet Take 81 mg by mouth daily. Swallow whole.     Baclofen  5 MG TABS Take 5 mg by mouth daily.     ezetimibe  (ZETIA ) 10 MG tablet Take 10 mg by mouth at bedtime.     FLUoxetine  (PROZAC ) 10 MG capsule Take 10 mg by mouth daily.     fluticasone (FLONASE) 50 MCG/ACT nasal spray Place 1 spray into both nostrils 2 (two) times daily.     folic acid  (FOLVITE ) 1 MG tablet Take 1 mg by mouth daily.     gabapentin  (NEURONTIN ) 100 MG capsule Take 100 mg by mouth 3 (three) times daily.     meloxicam  (MOBIC ) 7.5 MG tablet Take 7.5 mg by mouth daily.     OLANZapine  (ZYPREXA ) 2.5 MG tablet Take 2.5 mg by mouth at bedtime.     oxycodone  (OXY-IR) 5 MG capsule Take 5 mg by mouth every 4 (four) hours as needed for pain.     pantoprazole  (PROTONIX ) 40  MG tablet Take 40 mg by mouth daily.     polyethylene glycol (MIRALAX  / GLYCOLAX ) 17 g packet Take 17 g by mouth 3 (three) times daily.     rosuvastatin (CRESTOR) 5 MG tablet Take 1 tablet (5 mg total) by mouth daily. 90 tablet 3   tamsulosin  (FLOMAX ) 0.4 MG CAPS capsule Take 1 capsule (0.4 mg total) by mouth daily after supper.     VENTOLIN  HFA 108 (90 Base) MCG/ACT inhaler Inhale 1-2 puffs into the lungs every 4 (four) hours as needed for wheezing or shortness of breath.     No current facility-administered medications on file prior to visit.     No Known Allergies   Objective:  Physical Exam: There were no vitals filed for this visit.   Randall Mccormick is a pleasant 68 y.o. Caucasian male in NAD. AAO x 3.  Vascular Examination: DP and PT pulses palpable 2/4 bilaterally.  Cap refill intact.  Decreased pedal hair growth.  Erythema and mild edema noted about the left first toe.  Bilateral lower extremities edematous at baseline  Dermatological Examination: Overlying hemorrhagic callus with underlying ulceration noted left first toe.    Wound Location: Distal medial first toe, plantar aspect left foot There is a considerable amount  of devitalized tissue present in the wound. Predebridement Wound Measurement: Overlying hemorrhagic callus precluding measurement  Postdebridement Wound Measurement: 0.6 x 0.4 x 0.3 cm. Wound Base: Granular/Healthy, following debridement of nonviable tissue, fibrotic tissue was noted underneath the callus.  Subcutaneous tissue involvement Peri-wound: Reddened, Calloused Exudate: Bleeding present Blood Loss during debridement: 3 cc('s). Sign(s) of clinical bacterial infection: erythema, edema, and pain on palpation  Musculoskeletal Examination: Spastic digital contractures and hallux malleus deformities noted bilaterally.  The contractures are semireducible.  There is significant lateral valgus drift of the first toes bilaterally.  Decreased muscle strength  bilaterally.  Neurological Examination: Light touch sensation decreased to toes.  Protective sensation decreased.  Labs:    Latest Ref Rng & Units 09/11/2023    3:44 AM  Hemoglobin A1C  Hemoglobin-A1c 4.8 - 5.6 % 5.3        No results for input(s): GRAMSTAIN, LABORGA in the last 8760 hours.   Lab Results  Component Value Date   WBC 9.3 12/25/2023   HGB 14.9 12/25/2023   HCT 46.6 12/25/2023   MCV 94.7 12/25/2023   PLT 212 12/25/2023     No results found.  No results found.   Assessment:  1. Ulcer of great toe, left, with fat layer exposed (HCC) (Primary) -Low-grade cellulitis present - mupirocin  ointment (BACTROBAN ) 2 %; Apply 1 Application topically 2 (two) times daily.  Dispense: 30 g; Refill: 2 - doxycycline  (VIBRA -TABS) 100 MG tablet; Take 1 tablet (100 mg total) by mouth 2 (two) times daily for 10 days.  Dispense: 20 tablet; Refill: 0  2. Hammer toes of both feet -Original plan for flexor tenotomy of painful right 2nd and 3rd toe, will defer this for now  3. Neuropathy involving both lower extremities -Increased risk for ulceration   Plan:  -Orders on today's visit: No orders of the defined types were placed in this encounter.   -Patient was evaluated and treated and all questions answered.  -Patient/POA/Family member educated on diagnosis and treatment plan of routine ulcer debridement/wound care.  -Ulceration debridement achieved utilizing sharp excisional debridement with sterile scalpel blade and sterile currette.. Type/amount of devitalized tissue removed: nonviable hyperkeratosis, underlying fibrotic tissue, significant.  100% involvement -Today's ulcer size post-debridement: 0.6 x 0.4 x 0.3 cm. -Ulceration cleansed with wound cleanser. Mupirocin  Ointment applied to base of ulceration and secured with light dressing.  Felt offloading padding applied. -Wound responded well to today's debridement. -Patient risk factors affecting healing of ulcer:  neuropathy, foot deformity, chronic lower extremity edema, gait abnormality from incomplete paraplegia -Randall Mccormick given written instructions on daily wound care for left first toe ulceration. -Mupirocin  sent to patient's pharmacy.  Antibiotics out of abundance of caution as well due to some periwound erythema, 10-day course of doxycycline .  May cleanse wound daily with antibacterial soap and water  or with wound cleanser, apply small amount of mupirocin  to the ulcer, secure with dry dressing. -Frequency of debridements needed to achieve healing: Every 2 to 3 weeks -Low threshold for x-ray if concerns about healing progression or developing infection - Monitor for signs and symptoms of worsening infection.  Contact office immediately if this occurs.  # Spastic hammertoes and digital deformities -Contributing to the development of the left first toe ulceration along with gait abnormality -Will defer the planned flexor tenotomy of the right 2nd and 3rd toe. Return in about 2 weeks (around 04/15/2024) for Wound Care.  Randall Mccormick, DPM      [place] LOCATION: [address]  [place], [place] [address]                   Office [phone]   [place] LOCATION: [address] [place], [place] [address] Office [phone]

## 2024-04-01 NOTE — Patient Instructions (Addendum)
 Instructions for Wound Care  The most important step to healing a foot wound is to reduce the pressure on your foot - it is extremely important to stay off your foot as much as possible and wear the shoe/boot as instructed.  Cleanse your foot with saline wash or warm soapy water  (dial antibacterial soap or similar).  Blot dry.  Apply prescribed medication to your wound and cover with gauze and a bandage.  May hold bandage in place with Coban (self sticky wrap), Ace bandage or tape.  You may find dressing supplies at your local [employer], [employer], drug store or medical supply store.  Monitor for any signs/symptoms of infection. If there is any increase in redness, red streaks, increase in drainage, warmth to your foot please give us  a call. Also, if you start to run a fever or have flu-like symptoms that can also be a sign of infection. Call the office immediately if any occur or go directly to the emergency room.   If you have any questions, please feel free to give us  a call at [phone] or if you are on MyChart you can always send me a message if needed.   Also recommend trying to offload the area with callus pads that he can get from [employer].  Do your best to try and stay off the left foot to limit added friction.

## 2024-04-13 ENCOUNTER — Ambulatory Visit (INDEPENDENT_AMBULATORY_CARE_PROVIDER_SITE_OTHER): Payer: MEDICARE

## 2024-04-13 ENCOUNTER — Ambulatory Visit: Payer: MEDICARE | Admitting: Podiatry

## 2024-04-13 DIAGNOSIS — L97522 Non-pressure chronic ulcer of other part of left foot with fat layer exposed: Secondary | ICD-10-CM | POA: Diagnosis not present

## 2024-04-13 DIAGNOSIS — M2042 Other hammer toe(s) (acquired), left foot: Secondary | ICD-10-CM

## 2024-04-13 DIAGNOSIS — G5793 Unspecified mononeuropathy of bilateral lower limbs: Secondary | ICD-10-CM

## 2024-04-13 DIAGNOSIS — M2041 Other hammer toe(s) (acquired), right foot: Secondary | ICD-10-CM

## 2024-04-13 NOTE — Patient Instructions (Addendum)
 Instructions for Wound Care  The most important step to healing a foot wound is to reduce the pressure on your foot - it is extremely important to stay off your foot as much as possible and wear the shoe/boot as instructed.  Cleanse your foot with saline wash or warm soapy water  (dial antibacterial soap or similar).  Blot dry.  Apply prescribed medication (mupirocin ) to your wound and cover with gauze and a bandage. If the ointment is keeping the wound and surrounding skin too moist you may try switching to betadine  gauze dressings. May hold bandage in place with Coban (self sticky wrap), Ace bandage or tape.  You may find dressing supplies at your local [employer], Target, drug store or medical supply store.  Monitor for any signs/symptoms of infection. If there is any increase in redness, red streaks, increase in drainage, warmth to your foot please give us  a call. Also, if you start to run a fever or have flu-like symptoms that can also be a sign of infection. Call the office immediately if any occur or go directly to the emergency room.   If you have any questions, please feel free to give us  a call at [phone] or if you are on MyChart you can always send me a message if needed.

## 2024-04-14 ENCOUNTER — Ambulatory Visit: Payer: MEDICARE

## 2024-04-14 ENCOUNTER — Encounter: Payer: Self-pay | Admitting: Podiatry

## 2024-04-14 DIAGNOSIS — I340 Nonrheumatic mitral (valve) insufficiency: Secondary | ICD-10-CM | POA: Insufficient documentation

## 2024-04-14 LAB — ECHOCARDIOGRAM COMPLETE
AR max vel: 1.79 cm2
AV Area VTI: 1.85 cm2
AV Area mean vel: 1.75 cm2
AV Mean grad: 5.8 mmHg
AV Peak grad: 10.7 mmHg
Ao pk vel: 1.64 m/s
Area-P 1/2: 2.92 cm2
MV M vel: 3.94 m/s
MV Peak grad: 62.1 mmHg
MV VTI: 1.27 cm2
S' Lateral: 3.1 cm

## 2024-04-14 NOTE — Progress Notes (Signed)
 Subjective:  Patient ID: Prudencio Velazco, male    DOB: 12/15/1955,  MRN: [id number]  68 y.o. male is seen today for evaluation of left first toe ulceration he is accompanied by his wife..  Prescribed wound care regimen: mupirocin  ointment dressing daily.  Patient is using rollator.  Patient relates wound has worsened slightly.  Patient The patient is having no constitutional symptoms, denying fever, chills, anorexia, or weight loss..     Current Outpatient Medications on File Prior to Visit  Medication Sig Dispense Refill   albuterol  (PROVENTIL ) (2.5 MG/3ML) 0.083% nebulizer solution Take 2.5 mg by nebulization every 6 (six) hours as needed for wheezing or shortness of breath.     allopurinol  (ZYLOPRIM ) 100 MG tablet Take 100 mg by mouth daily.     ALPRAZolam  (XANAX ) 1 MG tablet Take 1 mg by mouth 3 (three) times daily as needed for anxiety.     amLODipine  (NORVASC ) 2.5 MG tablet Take 2.5 mg by mouth daily.     aspirin  EC 81 MG tablet Take 81 mg by mouth daily. Swallow whole.     Baclofen  5 MG TABS Take 5 mg by mouth daily.     ezetimibe  (ZETIA ) 10 MG tablet Take 10 mg by mouth at bedtime.     FLUoxetine  (PROZAC ) 10 MG capsule Take 10 mg by mouth daily.     fluticasone (FLONASE) 50 MCG/ACT nasal spray Place 1 spray into both nostrils 2 (two) times daily.     folic acid  (FOLVITE ) 1 MG tablet Take 1 mg by mouth daily.     gabapentin  (NEURONTIN ) 100 MG capsule Take 100 mg by mouth 3 (three) times daily.     meloxicam  (MOBIC ) 7.5 MG tablet Take 7.5 mg by mouth daily.     mupirocin  ointment (BACTROBAN ) 2 % Apply 1 Application topically 2 (two) times daily. 30 g 2   OLANZapine  (ZYPREXA ) 2.5 MG tablet Take 2.5 mg by mouth at bedtime.     oxycodone  (OXY-IR) 5 MG capsule Take 5 mg by mouth every 4 (four) hours as needed for pain.     pantoprazole  (PROTONIX ) 40 MG tablet Take 40 mg by mouth daily.     polyethylene glycol (MIRALAX  / GLYCOLAX ) 17 g packet Take 17 g by mouth 3 (three) times daily.      rosuvastatin (CRESTOR) 5 MG tablet Take 1 tablet (5 mg total) by mouth daily. 90 tablet 3   tamsulosin  (FLOMAX ) 0.4 MG CAPS capsule Take 1 capsule (0.4 mg total) by mouth daily after supper.     VENTOLIN  HFA 108 (90 Base) MCG/ACT inhaler Inhale 1-2 puffs into the lungs every 4 (four) hours as needed for wheezing or shortness of breath.     No current facility-administered medications on file prior to visit.     No Known Allergies   Objective:  Physical Exam: There were no vitals filed for this visit.   Jaeshawn Silvio is a pleasant 68 y.o. Caucasian male in NAD. AAO x 3.  Vascular Examination: DP and PT pulses palpable 2/4 bilaterally. Cap refill intact. Decreased pedal hair growth.  Decreased erythema from previous. Bilateral lower extremities edematous at baseline   Dermatological Examination: Left first toe ulceration site there is extensive hemorrhagic callus noted that appears expanded from previous.     Wound Location: Medial first toe left foot just distal to interphalangeal joint There is a considerable amount of devitalized tissue present in the wound. Predebridement Wound Measurement: 0.5 x 0.3 x 0.2 cm.  With extensive hemorrhagic callus  Postdebridement Wound Measurement: 1.5 x 0.7 x 0.3 cm. Wound Base: Granular/Healthy following debridement of nonviable overlying hemorrhagic callus. Peri-wound: Macerated, Calloused Exudate: Scant/small amount Serous exudate Blood Loss during debridement: 2 cc('s). Sign(s) of clinical bacterial infection: no clinical signs of infection noted on examination today.  Musculoskeletal Examination: Spastic hammertoe contractures noted bilaterally.  Valgus drift of first toe.  Tenderness on palpation of left first toe interphalangeal joint level where palpable spurring is present.  Neurological Examination: Light touch sensation decreased to toes.  Labs:    Latest Ref Rng & Units 09/11/2023    3:44 AM  Hemoglobin A1C  Hemoglobin-A1c  4.8 - 5.6 % 5.3        No results for input(s): GRAMSTAIN, LABORGA in the last 8760 hours.   Lab Results  Component Value Date   WBC 9.3 12/25/2023   HGB 14.9 12/25/2023   HCT 46.6 12/25/2023   MCV 94.7 12/25/2023   PLT 212 12/25/2023     No results found.  Radiographs left foot:  No acute fractures noted.  Arthritis appreciated to the toes and left first MPJ, left first IPJ.  Periarticular spurring present to the first toe and first metatarsal phalangeal joint.  Moderate bunion deformity present.  Valgus drift of first toe with slight lateral subluxation of the first toe interphalangeal joint.  No osteolysis or cortical erosions.  No soft tissue emphysema.  Arthritic changes to ankle and hindfoot seen as well with significant calcaneal spurring. No results found.   Assessment:  1. Ulcer of great toe, left, with fat layer exposed (HCC) (Primary) - DG Foot Complete Left; Future  2. Neuropathy involving both lower extremities  3. Hammer toes of both feet    Plan:  -Orders on today's visit:  Orders Placed This Encounter  Procedures   DG Foot Complete Left    Standing Status:   Future    Number of Occurrences:   1    Expiration Date:   04/13/2025    Reason for Exam (SYMPTOM  OR DIAGNOSIS REQUIRED):   wound    Preferred imaging location?:   Internal    -Patient was evaluated and treated and all questions answered.  -Patient/POA/Family member educated on diagnosis and treatment plan of routine ulcer debridement/wound care.  -Ulceration debridement achieved utilizing sharp excisional debridement with sterile scalpel blade and sterile currette.. Type/amount of devitalized tissue removed: nonviable hyperkeratosis, necrotic skin and subcutaneous tissue -Today's ulcer size post-debridement: 1.5 x 0.7 x 0.3 cm.  Enlarged to previous. -Ulceration cleansed with wound cleanser. mupirocin  ointment applied to base of ulceration and secured with light dressing.  Offloading  applied. -Wound responded well to today's debridement. -Patient risk factors affecting healing of ulcer: neuropathy, foot deformity, gait abnormality -Lamar Sharps given written instructions on daily wound care for left first toe ulceration. -Continue with the mupirocin  for now.  If wife notices significant maceration about the wound bed, instructed her to switch to Betadine  gauze wet-to-dry dressings.  Also reviewed proper offloading with the patient.  horse shoe pad applied today about the ulceration -Frequency of debridements needed to achieve healing: weekly to biweekly -Wound culture and sensitivity ordered today. -Radiology ordered today: X-Ray: see above findings, reviewed with patient. - Has completed doxycycline . -Monitor for signs of recurrent or worsening infection.  Contact office immediately if this occurs. -Discussed challenges with offloading the toe.  May need to consider FHL tenotomy if ulceration does not improve or worsens. Advised need for limited stance and gait. Return in 2 weeks (on  04/27/2024) for Wound Care.  Ethan LITTIE Saddler, DPM      [place] LOCATION: [address] [place] [address]                   Office [phone]   [place] LOCATION: [address] [place], [place] [address] Office [phone]

## 2024-04-15 ENCOUNTER — Ambulatory Visit: Payer: Self-pay

## 2024-04-21 ENCOUNTER — Telehealth: Payer: Self-pay | Admitting: Podiatry

## 2024-04-21 NOTE — Telephone Encounter (Signed)
 Called in to reschedule appointment but your next available is in December. He is coming in for a wound check. Is it possible to get him in sooner ?

## 2024-04-22 ENCOUNTER — Telehealth: Payer: Self-pay | Admitting: Podiatry

## 2024-04-22 NOTE — Telephone Encounter (Signed)
 Patient is scheduled on 11/4 for wound check. Original appointment was 10/21. Is it okay for him to come on 11/4 ?

## 2024-04-27 ENCOUNTER — Ambulatory Visit: Payer: MEDICARE | Admitting: Podiatry

## 2024-04-27 DIAGNOSIS — G5793 Unspecified mononeuropathy of bilateral lower limbs: Secondary | ICD-10-CM

## 2024-04-27 DIAGNOSIS — M79675 Pain in left toe(s): Secondary | ICD-10-CM

## 2024-04-27 DIAGNOSIS — L97522 Non-pressure chronic ulcer of other part of left foot with fat layer exposed: Secondary | ICD-10-CM

## 2024-04-27 DIAGNOSIS — L03032 Cellulitis of left toe: Secondary | ICD-10-CM | POA: Diagnosis not present

## 2024-04-27 DIAGNOSIS — B351 Tinea unguium: Secondary | ICD-10-CM | POA: Diagnosis not present

## 2024-04-27 DIAGNOSIS — M79674 Pain in right toe(s): Secondary | ICD-10-CM | POA: Diagnosis not present

## 2024-04-27 MED ORDER — DOXYCYCLINE HYCLATE 100 MG PO TABS: 100.0000 mg | ORAL_TABLET | Freq: Two times a day (BID) | ORAL | 0 refills | Status: DC

## 2024-04-27 NOTE — Patient Instructions (Signed)

## 2024-04-27 NOTE — Progress Notes (Unsigned)
 Subjective:  Patient ID: Randall Mccormick, male    DOB: 1955/10/19,  MRN: [id number]  68 y.o. male is seen today for evaluation of left hallux ulceration.  Also presenting with painful, thickened, elongated dystrophic toenails that the patient is unable to maintain himself due to their dystrophic appearance and also due to mobility issues.  Prescribed wound care regimen: mupirocin  ointment dressing daily.  Patient is using rollator.  He has gait abnormality from back pain, neuropathy, spastic hammertoe contractures.  Patient relates wound has worsened slightly, there is increased pain and redness about the left first toe.  Patient The patient is having no constitutional symptoms, denying fever, chills, anorexia, or weight loss..     Current Outpatient Medications on File Prior to Visit  Medication Sig Dispense Refill   albuterol  (PROVENTIL ) (2.5 MG/3ML) 0.083% nebulizer solution Take 2.5 mg by nebulization every 6 (six) hours as needed for wheezing or shortness of breath.     allopurinol  (ZYLOPRIM ) 100 MG tablet Take 100 mg by mouth daily.     ALPRAZolam  (XANAX ) 1 MG tablet Take 1 mg by mouth 3 (three) times daily as needed for anxiety.     amLODipine  (NORVASC ) 2.5 MG tablet Take 2.5 mg by mouth daily.     aspirin  EC 81 MG tablet Take 81 mg by mouth daily. Swallow whole.     Baclofen  5 MG TABS Take 5 mg by mouth daily.     ezetimibe  (ZETIA ) 10 MG tablet Take 10 mg by mouth at bedtime.     FLUoxetine  (PROZAC ) 10 MG capsule Take 10 mg by mouth daily.     fluticasone (FLONASE) 50 MCG/ACT nasal spray Place 1 spray into both nostrils 2 (two) times daily.     folic acid  (FOLVITE ) 1 MG tablet Take 1 mg by mouth daily.     gabapentin  (NEURONTIN ) 100 MG capsule Take 100 mg by mouth 3 (three) times daily.     meloxicam  (MOBIC ) 7.5 MG tablet Take 7.5 mg by mouth daily.     mupirocin  ointment (BACTROBAN ) 2 % Apply 1 Application topically 2 (two) times daily. 30 g 2   OLANZapine  (ZYPREXA ) 2.5 MG  tablet Take 2.5 mg by mouth at bedtime.     oxycodone  (OXY-IR) 5 MG capsule Take 5 mg by mouth every 4 (four) hours as needed for pain.     pantoprazole  (PROTONIX ) 40 MG tablet Take 40 mg by mouth daily.     polyethylene glycol (MIRALAX  / GLYCOLAX ) 17 g packet Take 17 g by mouth 3 (three) times daily.     rosuvastatin (CRESTOR) 5 MG tablet Take 1 tablet (5 mg total) by mouth daily. 90 tablet 3   tamsulosin  (FLOMAX ) 0.4 MG CAPS capsule Take 1 capsule (0.4 mg total) by mouth daily after supper.     VENTOLIN  HFA 108 (90 Base) MCG/ACT inhaler Inhale 1-2 puffs into the lungs every 4 (four) hours as needed for wheezing or shortness of breath.     No current facility-administered medications on file prior to visit.     No Known Allergies   Objective:  Physical Exam: There were no vitals filed for this visit.   Randall Mccormick is a pleasant 68 y.o. Caucasian male in NAD. AAO x 3.  Vascular Examination: DP and PT pulses palpable 2/4 bilaterally. Cap refill intact. Decreased pedal hair growth. Bilateral lower extremities edematous at baseline.  Erythema present left first toe diffusely, does not quite extend to the MPJ.  Dermatological Examination: Left first toe ulceration plantar medial  IPJ there is extensive hemorrhagic callus.  Periwound erythema noted.  No edema.  Tender on palpation.  Nailplates x 10 are also thickened, elongated, dystrophic with yellow discoloration subungual debris.  They are tender on direct dorsal palpation.   Wound Location: Left first toe There is a considerable amount of devitalized tissue present in the wound. Predebridement Wound Measurement:  ***  x *** x ***cm. Postdebridement Wound Measurement: *** x *** x ***cm. Wound Base: {findings; base ulcer:11056} Peri-wound: {Peri-wound:10864} Exudate: {:10862} Blood Loss during debridement: {NUMBERS; 0-10:5044} cc('s). Sign(s) of clinical bacterial infection: {JGSignsofInfection:24077}  Musculoskeletal  Examination: {jgmsk:23600}  Neurological Examination: {jgneuro:23601}  Labs:    Latest Ref Rng & Units 09/11/2023    3:44 AM  Hemoglobin A1C  Hemoglobin-A1c 4.8 - 5.6 % 5.3        No results for input(s): GRAMSTAIN, LABORGA in the last 8760 hours.   Lab Results  Component Value Date   WBC 9.3 12/25/2023   HGB 14.9 12/25/2023   HCT 46.6 12/25/2023   MCV 94.7 12/25/2023   PLT 212 12/25/2023     No results found.  Radiographs {jgPodToeLocator:23637}:  {jgxrayfindings:23683} No results found.   Assessment:  1. Ulcer of great toe, left, with fat layer exposed (HCC) (Primary) *** - WOUND CULTURE  2. Cellulitis of toe of left foot *** - doxycycline  (VIBRA -TABS) 100 MG tablet; Take 1 tablet (100 mg total) by mouth 2 (two) times daily for 10 days.  Dispense: 20 tablet; Refill: 0  3. Pain due to onychomycosis of toenails of both feet ***  4. Neuropathy involving both lower extremities ***   Plan:  -Orders on today's visit:  Orders Placed This Encounter  Procedures   WOUND CULTURE    Sagis left first toe    Source:   left first toe    -Patient was evaluated and treated and all questions answered.  -Patient/POA/Family member educated on diagnosis and treatment plan of routine ulcer debridement/wound care.  -Ulceration debridement achieved utilizing sharp excisional debridement with {jgtools:23895}. Type/amount of devitalized tissue removed: {jgtissue:24134} -Today's ulcer size post-debridement: *** x *** x *** cm. -Ulceration cleansed with wound cleanser. {jgwcproduct:24043} applied to base of ulceration and secured with light dressing. -Wound responded well to today's debridement. -Patient risk factors affecting healing of ulcer: {jgriskfactors:24044} -Lamar Sharps given written instructions on daily wound care for {jgPodToeLocator:23637} ulceration. -{jgrx:23616} -Orders for Home Health Wound care to be faxed to: *** -Frequency of debridements needed to  achieve healing: weekly to biweekly -Wound culture and sensitivity ordered today. -Consultations ordered today: {JGSpecialistReferral:27127} -Radiology ordered today: {Radiology:18555}. -Labs ordered today: {micro:18246}. -{Jgplan:23602::-Patient/POA to call should there be question/concern in the interim.} Return in about 2 weeks (around 05/11/2024) for Wound Care.  Ethan LITTIE Saddler, DPM      [place] LOCATION: [address], [place] [address]                   Office [phone]   [place] LOCATION: [address] [place], [place] [address] Office [phone]   Nails today  Toe cellulitic   0.8 x 0.6 post   Also developing ingrown from gait pattern  Surgical shoe

## 2024-04-30 ENCOUNTER — Encounter: Payer: Self-pay | Admitting: Podiatry

## 2024-05-03 ENCOUNTER — Ambulatory Visit: Payer: Self-pay | Admitting: Podiatry

## 2024-05-03 DIAGNOSIS — L03032 Cellulitis of left toe: Secondary | ICD-10-CM

## 2024-05-03 MED ORDER — SULFAMETHOXAZOLE-TRIMETHOPRIM 800-160 MG PO TABS: 1.0000 | ORAL_TABLET | Freq: Two times a day (BID) | ORAL | 0 refills | Status: AC

## 2024-05-11 ENCOUNTER — Ambulatory Visit (INDEPENDENT_AMBULATORY_CARE_PROVIDER_SITE_OTHER): Payer: MEDICARE | Admitting: Podiatry

## 2024-05-11 ENCOUNTER — Encounter: Payer: Self-pay | Admitting: Podiatry

## 2024-05-11 DIAGNOSIS — M2042 Other hammer toe(s) (acquired), left foot: Secondary | ICD-10-CM

## 2024-05-11 DIAGNOSIS — L97522 Non-pressure chronic ulcer of other part of left foot with fat layer exposed: Secondary | ICD-10-CM

## 2024-05-11 DIAGNOSIS — M2041 Other hammer toe(s) (acquired), right foot: Secondary | ICD-10-CM

## 2024-05-11 DIAGNOSIS — G5793 Unspecified mononeuropathy of bilateral lower limbs: Secondary | ICD-10-CM

## 2024-05-11 NOTE — Progress Notes (Unsigned)
 Subjective:  Patient ID: Randall Mccormick, male    DOB: March 11, 1956,  MRN: [id number]  68 y.o. male is seen today for evaluation of left hallux ulceration.  He is accompanied by his wife.  Believes it is improving.  He completed course of oral doxycycline , redness and swelling significantly improved.  Prescribed wound care regimen: Betadine  Solution dressing daily.  Patient is using Darco Shoe.  Patient relates wound has slightly improved.  Patient The patient is having no constitutional symptoms, denying fever, chills, anorexia, or weight loss..     Current Outpatient Medications on File Prior to Visit  Medication Sig Dispense Refill   albuterol  (PROVENTIL ) (2.5 MG/3ML) 0.083% nebulizer solution Take 2.5 mg by nebulization every 6 (six) hours as needed for wheezing or shortness of breath.     allopurinol  (ZYLOPRIM ) 100 MG tablet Take 100 mg by mouth daily.     ALPRAZolam  (XANAX ) 1 MG tablet Take 1 mg by mouth 3 (three) times daily as needed for anxiety.     amLODipine  (NORVASC ) 2.5 MG tablet Take 2.5 mg by mouth daily.     aspirin  EC 81 MG tablet Take 81 mg by mouth daily. Swallow whole.     Baclofen  5 MG TABS Take 5 mg by mouth daily.     ezetimibe  (ZETIA ) 10 MG tablet Take 10 mg by mouth at bedtime.     FLUoxetine  (PROZAC ) 10 MG capsule Take 10 mg by mouth daily.     fluticasone (FLONASE) 50 MCG/ACT nasal spray Place 1 spray into both nostrils 2 (two) times daily.     folic acid  (FOLVITE ) 1 MG tablet Take 1 mg by mouth daily.     gabapentin  (NEURONTIN ) 100 MG capsule Take 100 mg by mouth 3 (three) times daily.     meloxicam  (MOBIC ) 7.5 MG tablet Take 7.5 mg by mouth daily.     mupirocin  ointment (BACTROBAN ) 2 % Apply 1 Application topically 2 (two) times daily. 30 g 2   OLANZapine  (ZYPREXA ) 2.5 MG tablet Take 2.5 mg by mouth at bedtime.     oxycodone  (OXY-IR) 5 MG capsule Take 5 mg by mouth every 4 (four) hours as needed for pain.     pantoprazole  (PROTONIX ) 40 MG tablet Take 40 mg by  mouth daily.     polyethylene glycol (MIRALAX  / GLYCOLAX ) 17 g packet Take 17 g by mouth 3 (three) times daily.     rosuvastatin (CRESTOR) 5 MG tablet Take 1 tablet (5 mg total) by mouth daily. 90 tablet 3   tamsulosin  (FLOMAX ) 0.4 MG CAPS capsule Take 1 capsule (0.4 mg total) by mouth daily after supper.     VENTOLIN  HFA 108 (90 Base) MCG/ACT inhaler Inhale 1-2 puffs into the lungs every 4 (four) hours as needed for wheezing or shortness of breath.     No current facility-administered medications on file prior to visit.     No Known Allergies   Objective:  Physical Exam: There were no vitals filed for this visit.   Randall Mccormick is a pleasant 68 y.o. Caucasian male in NAD. AAO x 3.  Vascular Examination: DP and PT pulses palpable 2/4 bilaterally. Cap refill intact. Decreased pedal hair growth. Bilateral lower extremities edematous at baseline.  Left first toe erythema noted previously appears significantly improved.  Dermatological Examination: Overlying hemorrhagic callus at the site of the ulceration plantar medial first toe left foot.  Decreased periwound erythema or localized edema noted.    Wound Location: Left first toe plantar medial aspect There is  a moderate amount of devitalized hyperkeratotic tissue present in the wound. Predebridement Wound Measurement: Limited by significant overlying hemorrhagic callus approximately 1.5 x 1.5 cm in diameter Postdebridement Wound Measurement: 0.5 x 0.4 x 0.3 cm with central area of subcutaneous tissue involvement Wound Base: Granular/Healthy Peri-wound: Calloused, area of resolved hemorrhagic blister and callus to the distal first toe Exudate: No significant drainage noted today.  Wound bed viable and bleeds well. Blood Loss during debridement: 2 cc('s). Sign(s) of clinical bacterial infection: no clinical signs of infection noted on examination today.  Musculoskeletal Examination: Spastic hammertoe contractures noted bilaterally.  Valgus drift of left first toe greater than right. Tenderness on palpation of left first toe interphalangeal joint level where palpable spurring is present. Hallux malleus contracture present that is semireducible  Neurological Examination: Light touch sensation decreased to toes.  Subjective paresthesias noted bilaterally.  Labs:    Latest Ref Rng & Units 09/11/2023    3:44 AM  Hemoglobin A1C  Hemoglobin-A1c 4.8 - 5.6 % 5.3        No results for input(s): GRAMSTAIN, LABORGA in the last 8760 hours.   Lab Results  Component Value Date   WBC 9.3 12/25/2023   HGB 14.9 12/25/2023   HCT 46.6 12/25/2023   MCV 94.7 12/25/2023   PLT 212 12/25/2023     No results found.   No results found.   Assessment:  1. Ulcer of great toe, left, with fat layer exposed (HCC) (Primary)   2. Neuropathy involving both lower extremities   3. Hammer toes of both feet    Plan:  -Orders on today's visit: No orders of the defined types were placed in this encounter.   -Patient was evaluated and treated and all questions answered.  -Patient/POA/Family member educated on diagnosis and treatment plan of routine ulcer debridement/wound care.  -Ulceration debridement achieved utilizing sharp excisional debridement with sterile scalpel blade and sterile currette.. Type/amount of devitalized tissue removed: nonviable hyperkeratosis -Today's ulcer size post-debridement: 0.5 x 0.4 x 0.3 cm with central area of subcutaneous tissue involvement. -Ulceration cleansed with wound cleanser. silvadene  cream applied to base of ulceration and secured with light dressing. -Wound responded well to today's debridement. -Patient risk factors affecting healing of ulcer: neuropathy, foot deformity, gait abnormality -Lamar Sharps given written instructions on daily wound care for left first toe ulceration. -Discussed indications of using Betadine  wet-to-dry dressings versus Silvadene  seeking to obtain and maintain good  moisture balance.  Recommend 1-2 times a day daily dressing changes with offloading felt padding applied. -Frequency of debridements needed to achieve healing: Every 2 to 3 weeks -Wound culture and sensitivity reviewed from previous, did grow coagulase-negative staph, did respond well to doxycycline . - Monitor for signs of worsening infection.  Contact office immediately if this does develop. -Does have spastic hammertoe contractures of both feet that do affect the great toes bilaterally to degree.  Can consider flexor tenotomy's as appropriate once wound is healed. Return in 2 weeks (on 05/25/2024) for Wound Care.  Ethan LITTIE Saddler, DPM      [place] LOCATION: [address] [place] [address]                   Office [phone])  [phone]   [place] LOCATION: [address], [address] [place], [place] [address] Office [phone]

## 2024-05-15 ENCOUNTER — Encounter (HOSPITAL_COMMUNITY): Payer: Self-pay | Admitting: Pharmacy Technician

## 2024-05-15 ENCOUNTER — Inpatient Hospital Stay (HOSPITAL_COMMUNITY)
Admission: EM | Admit: 2024-05-15 | Discharge: 2024-05-17 | DRG: 194 | Disposition: A | Discharge status: Home Health | Payer: MEDICARE | Attending: Family Medicine | Admitting: Family Medicine

## 2024-05-15 ENCOUNTER — Emergency Department (HOSPITAL_COMMUNITY): Payer: MEDICARE

## 2024-05-15 ENCOUNTER — Inpatient Hospital Stay (HOSPITAL_COMMUNITY): Payer: MEDICARE

## 2024-05-15 ENCOUNTER — Other Ambulatory Visit: Payer: Self-pay

## 2024-05-15 DIAGNOSIS — Z96651 Presence of right artificial knee joint: Secondary | ICD-10-CM | POA: Diagnosis present

## 2024-05-15 DIAGNOSIS — R1319 Other dysphagia: Secondary | ICD-10-CM

## 2024-05-15 DIAGNOSIS — R0602 Shortness of breath: Secondary | ICD-10-CM

## 2024-05-15 DIAGNOSIS — T17908A Unspecified foreign body in respiratory tract, part unspecified causing other injury, initial encounter: Secondary | ICD-10-CM

## 2024-05-15 DIAGNOSIS — I10 Essential (primary) hypertension: Secondary | ICD-10-CM | POA: Diagnosis present

## 2024-05-15 DIAGNOSIS — Z7982 Long term (current) use of aspirin: Secondary | ICD-10-CM

## 2024-05-15 DIAGNOSIS — N319 Neuromuscular dysfunction of bladder, unspecified: Secondary | ICD-10-CM | POA: Diagnosis present

## 2024-05-15 DIAGNOSIS — R1312 Dysphagia, oropharyngeal phase: Secondary | ICD-10-CM | POA: Diagnosis present

## 2024-05-15 DIAGNOSIS — Z789 Other specified health status: Secondary | ICD-10-CM

## 2024-05-15 DIAGNOSIS — I2510 Atherosclerotic heart disease of native coronary artery without angina pectoris: Secondary | ICD-10-CM | POA: Diagnosis present

## 2024-05-15 DIAGNOSIS — Z791 Long term (current) use of non-steroidal anti-inflammatories (NSAID): Secondary | ICD-10-CM

## 2024-05-15 DIAGNOSIS — G8222 Paraplegia, incomplete: Secondary | ICD-10-CM | POA: Diagnosis present

## 2024-05-15 DIAGNOSIS — J189 Pneumonia, unspecified organism: Principal | ICD-10-CM | POA: Diagnosis present

## 2024-05-15 DIAGNOSIS — F419 Anxiety disorder, unspecified: Secondary | ICD-10-CM | POA: Diagnosis present

## 2024-05-15 DIAGNOSIS — M109 Gout, unspecified: Secondary | ICD-10-CM | POA: Diagnosis present

## 2024-05-15 DIAGNOSIS — R0902 Hypoxemia: Secondary | ICD-10-CM | POA: Diagnosis present

## 2024-05-15 DIAGNOSIS — N400 Enlarged prostate without lower urinary tract symptoms: Secondary | ICD-10-CM | POA: Diagnosis present

## 2024-05-15 DIAGNOSIS — E785 Hyperlipidemia, unspecified: Secondary | ICD-10-CM | POA: Diagnosis present

## 2024-05-15 DIAGNOSIS — F32A Depression, unspecified: Secondary | ICD-10-CM | POA: Diagnosis present

## 2024-05-15 DIAGNOSIS — Z79899 Other long term (current) drug therapy: Secondary | ICD-10-CM

## 2024-05-15 DIAGNOSIS — D649 Anemia, unspecified: Secondary | ICD-10-CM

## 2024-05-15 DIAGNOSIS — K219 Gastro-esophageal reflux disease without esophagitis: Secondary | ICD-10-CM | POA: Diagnosis present

## 2024-05-15 DIAGNOSIS — K7460 Unspecified cirrhosis of liver: Secondary | ICD-10-CM | POA: Diagnosis present

## 2024-05-15 DIAGNOSIS — K5900 Constipation, unspecified: Secondary | ICD-10-CM | POA: Diagnosis present

## 2024-05-15 DIAGNOSIS — Z9049 Acquired absence of other specified parts of digestive tract: Secondary | ICD-10-CM

## 2024-05-15 DIAGNOSIS — J45909 Unspecified asthma, uncomplicated: Secondary | ICD-10-CM | POA: Diagnosis present

## 2024-05-15 DIAGNOSIS — R531 Weakness: Principal | ICD-10-CM

## 2024-05-15 DIAGNOSIS — Z1152 Encounter for screening for COVID-19: Secondary | ICD-10-CM

## 2024-05-15 HISTORY — DX: Shortness of breath: R06.02

## 2024-05-15 HISTORY — DX: Other specified health status: Z78.9

## 2024-05-15 LAB — URINALYSIS, W/ REFLEX TO CULTURE (INFECTION SUSPECTED)
Bacteria, UA: NONE SEEN
Bilirubin Urine: NEGATIVE
Glucose, UA: NEGATIVE mg/dL
Hgb urine dipstick: NEGATIVE
Ketones, ur: NEGATIVE mg/dL
Leukocytes,Ua: NEGATIVE
Nitrite: NEGATIVE
Protein, ur: NEGATIVE mg/dL
Specific Gravity, Urine: 1.014 (ref 1.005–1.030)
pH: 5 (ref 5.0–8.0)

## 2024-05-15 LAB — I-STAT CG4 LACTIC ACID, ED: Lactic Acid, Venous: 1.6 mmol/L (ref 0.5–1.9)

## 2024-05-15 LAB — CBC WITH DIFFERENTIAL/PLATELET
Abs Immature Granulocytes: 0.12 K/uL — ABNORMAL HIGH (ref 0.00–0.07)
Basophils Absolute: 0 K/uL (ref 0.0–0.1)
Basophils Relative: 0 %
Eosinophils Absolute: 0 K/uL (ref 0.0–0.5)
Eosinophils Relative: 0 %
HCT: 46.7 % (ref 39.0–52.0)
Hemoglobin: 15.2 g/dL (ref 13.0–17.0)
Immature Granulocytes: 1 %
Lymphocytes Relative: 5 %
Lymphs Abs: 0.8 K/uL (ref 0.7–4.0)
MCH: 31 pg (ref 26.0–34.0)
MCHC: 32.5 g/dL (ref 30.0–36.0)
MCV: 95.1 fL (ref 80.0–100.0)
Monocytes Absolute: 1 K/uL (ref 0.1–1.0)
Monocytes Relative: 6 %
Neutro Abs: 15.2 K/uL — ABNORMAL HIGH (ref 1.7–7.7)
Neutrophils Relative %: 88 %
Platelets: 238 K/uL (ref 150–400)
RBC: 4.91 MIL/uL (ref 4.22–5.81)
RDW: 15.1 % (ref 11.5–15.5)
WBC: 17.2 K/uL — ABNORMAL HIGH (ref 4.0–10.5)
nRBC: 0 % (ref 0.0–0.2)

## 2024-05-15 LAB — COMPREHENSIVE METABOLIC PANEL WITH GFR
ALT: 47 U/L — ABNORMAL HIGH (ref 0–44)
AST: 88 U/L — ABNORMAL HIGH (ref 15–41)
Albumin: 3.2 g/dL — ABNORMAL LOW (ref 3.5–5.0)
Alkaline Phosphatase: 127 U/L — ABNORMAL HIGH (ref 38–126)
Anion gap: 11 (ref 5–15)
BUN: 13 mg/dL (ref 8–23)
CO2: 27 mmol/L (ref 22–32)
Calcium: 8.7 mg/dL — ABNORMAL LOW (ref 8.9–10.3)
Chloride: 101 mmol/L (ref 98–111)
Creatinine, Ser: 0.78 mg/dL (ref 0.61–1.24)
GFR, Estimated: >60 mL/min (ref >60)
Glucose, Bld: 117 mg/dL — ABNORMAL HIGH (ref 70–99)
Potassium: 4 mmol/L (ref 3.5–5.1)
Sodium: 139 mmol/L (ref 135–145)
Total Bilirubin: 1.5 mg/dL — ABNORMAL HIGH (ref 0.0–1.2)
Total Protein: 6.9 g/dL (ref 6.5–8.1)

## 2024-05-15 LAB — I-STAT CHEM 8, ED
BUN: 17 mg/dL (ref 8–23)
Calcium, Ion: 1.12 mmol/L — ABNORMAL LOW (ref 1.15–1.40)
Chloride: 99 mmol/L (ref 98–111)
Creatinine, Ser: 0.9 mg/dL (ref 0.61–1.24)
Glucose, Bld: 126 mg/dL — ABNORMAL HIGH (ref 70–99)
HCT: 50 % (ref 39.0–52.0)
Hemoglobin: 17 g/dL (ref 13.0–17.0)
Potassium: 3.9 mmol/L (ref 3.5–5.1)
Sodium: 141 mmol/L (ref 135–145)
TCO2: 29 mmol/L (ref 22–32)

## 2024-05-15 LAB — RAPID URINE DRUG SCREEN, HOSP PERFORMED
Amphetamines: NOT DETECTED
Barbiturates: NOT DETECTED
Benzodiazepines: POSITIVE — AB
Cocaine: NOT DETECTED
Opiates: NOT DETECTED
Tetrahydrocannabinol: NOT DETECTED

## 2024-05-15 LAB — TROPONIN I (HIGH SENSITIVITY)
Troponin I (High Sensitivity): 8 ng/L (ref <18)
Troponin I (High Sensitivity): 6 ng/L (ref <18)

## 2024-05-15 LAB — RESP PANEL BY RT-PCR (RSV, FLU A&B, COVID)  RVPGX2
Influenza A by PCR: NEGATIVE
Influenza B by PCR: NEGATIVE
Resp Syncytial Virus by PCR: NEGATIVE
SARS Coronavirus 2 by RT PCR: NEGATIVE

## 2024-05-15 LAB — BRAIN NATRIURETIC PEPTIDE: B Natriuretic Peptide: 28.3 pg/mL (ref 0.0–100.0)

## 2024-05-15 MED ORDER — ASPIRIN 81 MG PO TBEC
81.0000 mg | DELAYED_RELEASE_TABLET | Freq: Every day | ORAL | Status: DC
  Administered 2024-05-15 – 2024-05-17 (×3): 81 mg via ORAL
  Filled 2024-05-15 (×3): qty 1

## 2024-05-15 MED ORDER — ACETAMINOPHEN 650 MG RE SUPP: 650.0000 mg | Freq: Four times a day (QID) | RECTAL | Status: DC | PRN

## 2024-05-15 MED ORDER — POLYETHYLENE GLYCOL 3350 17 G PO PACK
17.0000 g | PACK | Freq: Every day | ORAL | Status: DC | PRN
Start: 2024-05-15 — End: 2024-05-17

## 2024-05-15 MED ORDER — PANTOPRAZOLE SODIUM 40 MG PO TBEC
40.0000 mg | DELAYED_RELEASE_TABLET | Freq: Every day | ORAL | Status: DC
  Administered 2024-05-15 – 2024-05-17 (×3): 40 mg via ORAL
  Filled 2024-05-15 (×3): qty 1

## 2024-05-15 MED ORDER — SODIUM CHLORIDE 0.9 % IV SOLN
2.0000 g | Freq: Once | INTRAVENOUS | Status: AC
  Administered 2024-05-15: 2 g via INTRAVENOUS
  Filled 2024-05-15: qty 20

## 2024-05-15 MED ORDER — THIAMINE MONONITRATE 100 MG PO TABS
100.0000 mg | ORAL_TABLET | Freq: Every day | ORAL | Status: DC
  Administered 2024-05-15 – 2024-05-17 (×3): 100 mg via ORAL
  Filled 2024-05-15 (×4): qty 1

## 2024-05-15 MED ORDER — SODIUM CHLORIDE 0.9 % IV SOLN
2.0000 g | INTRAVENOUS | Status: DC
  Administered 2024-05-16: 2 g via INTRAVENOUS
  Filled 2024-05-15: qty 20

## 2024-05-15 MED ORDER — SODIUM CHLORIDE 0.9 % IV SOLN
500.0000 mg | INTRAVENOUS | Status: DC
  Administered 2024-05-16: 500 mg via INTRAVENOUS
  Filled 2024-05-15: qty 5

## 2024-05-15 MED ORDER — IOHEXOL 350 MG/ML SOLN
75.0000 mL | Freq: Once | INTRAVENOUS | Status: AC | PRN
  Administered 2024-05-15: 75 mL via INTRAVENOUS

## 2024-05-15 MED ORDER — ALPRAZOLAM 0.5 MG PO TABS
1.0000 mg | ORAL_TABLET | Freq: Three times a day (TID) | ORAL | Status: DC | PRN
Start: 2024-05-15 — End: 2024-05-17
  Administered 2024-05-16 – 2024-05-17 (×2): 1 mg via ORAL
  Filled 2024-05-15 (×2): qty 2

## 2024-05-15 MED ORDER — SODIUM CHLORIDE 0.9 % IV BOLUS
500.0000 mL | Freq: Once | INTRAVENOUS | Status: AC
  Administered 2024-05-15: 500 mL via INTRAVENOUS

## 2024-05-15 MED ORDER — FLUOXETINE HCL 20 MG PO CAPS
40.0000 mg | ORAL_CAPSULE | Freq: Every day | ORAL | Status: DC
  Administered 2024-05-15 – 2024-05-17 (×3): 40 mg via ORAL
  Filled 2024-05-15 (×3): qty 2

## 2024-05-15 MED ORDER — ALLOPURINOL 100 MG PO TABS
100.0000 mg | ORAL_TABLET | Freq: Every day | ORAL | Status: DC
  Administered 2024-05-15 – 2024-05-17 (×3): 100 mg via ORAL
  Filled 2024-05-15 (×3): qty 1

## 2024-05-15 MED ORDER — ALBUTEROL SULFATE (2.5 MG/3ML) 0.083% IN NEBU: 2.5000 mg | INHALATION_SOLUTION | Freq: Four times a day (QID) | RESPIRATORY_TRACT | Status: DC | PRN

## 2024-05-15 MED ORDER — ACETAMINOPHEN 325 MG PO TABS
650.0000 mg | ORAL_TABLET | Freq: Four times a day (QID) | ORAL | Status: DC | PRN
Start: 2024-05-15 — End: 2024-05-17
  Administered 2024-05-15 – 2024-05-16 (×2): 650 mg via ORAL
  Filled 2024-05-15 (×2): qty 2

## 2024-05-15 MED ORDER — FOLIC ACID 1 MG PO TABS
1.0000 mg | ORAL_TABLET | Freq: Every day | ORAL | Status: DC
  Administered 2024-05-15 – 2024-05-17 (×3): 1 mg via ORAL
  Filled 2024-05-15 (×3): qty 1

## 2024-05-15 MED ORDER — SODIUM CHLORIDE 0.9 % IV SOLN: 1.0000 g | Freq: Once | INTRAVENOUS | Status: DC

## 2024-05-15 MED ORDER — ADULT MULTIVITAMIN W/MINERALS CH
1.0000 | ORAL_TABLET | Freq: Every day | ORAL | Status: DC
  Administered 2024-05-15 – 2024-05-17 (×3): 1 via ORAL
  Filled 2024-05-15 (×4): qty 1

## 2024-05-15 MED ORDER — SENNA 8.6 MG PO TABS
1.0000 | ORAL_TABLET | Freq: Two times a day (BID) | ORAL | Status: DC
  Administered 2024-05-15 – 2024-05-17 (×3): 8.6 mg via ORAL
  Filled 2024-05-15 (×5): qty 1

## 2024-05-15 MED ORDER — TAMSULOSIN HCL 0.4 MG PO CAPS
0.4000 mg | ORAL_CAPSULE | Freq: Every day | ORAL | Status: DC
  Administered 2024-05-15 – 2024-05-16 (×2): 0.4 mg via ORAL
  Filled 2024-05-15 (×2): qty 1

## 2024-05-15 MED ORDER — THIAMINE HCL 100 MG/ML IJ SOLN
100.0000 mg | Freq: Every day | INTRAMUSCULAR | Status: DC
  Filled 2024-05-15: qty 2

## 2024-05-15 MED ORDER — GABAPENTIN 300 MG PO CAPS
300.0000 mg | ORAL_CAPSULE | Freq: Two times a day (BID) | ORAL | Status: DC
  Administered 2024-05-15 – 2024-05-17 (×4): 300 mg via ORAL
  Filled 2024-05-15 (×4): qty 1

## 2024-05-15 MED ORDER — BISACODYL 10 MG RE SUPP
10.0000 mg | Freq: Every day | RECTAL | Status: DC | PRN
Start: 2024-05-15 — End: 2024-05-17

## 2024-05-15 MED ORDER — SODIUM CHLORIDE 0.9 % IV SOLN
500.0000 mg | Freq: Once | INTRAVENOUS | Status: AC
  Administered 2024-05-15: 500 mg via INTRAVENOUS
  Filled 2024-05-15: qty 5

## 2024-05-15 MED ORDER — ENOXAPARIN SODIUM 40 MG/0.4ML IJ SOSY
40.0000 mg | PREFILLED_SYRINGE | INTRAMUSCULAR | Status: DC
  Administered 2024-05-15 – 2024-05-16 (×2): 40 mg via SUBCUTANEOUS
  Filled 2024-05-15 (×2): qty 0.4

## 2024-05-15 MED ORDER — AMLODIPINE BESYLATE 5 MG PO TABS
2.5000 mg | ORAL_TABLET | Freq: Every day | ORAL | Status: DC
  Administered 2024-05-15 – 2024-05-17 (×3): 2.5 mg via ORAL
  Filled 2024-05-15 (×3): qty 1

## 2024-05-15 NOTE — ED Triage Notes (Signed)
 Pt bib ems from home with generalized weakness and confusion onset yesterday. Pt initially hypoxic at 88%. Fever to 102. Given tylenol  pta. Pt reports he has been in the bed for the last 4 days.   99.5 after tylenol  120/80 Cbg 77 Cap 50 RR 22 HR 100

## 2024-05-15 NOTE — ED Notes (Signed)
 CCMD called, pt on monitor

## 2024-05-15 NOTE — Assessment & Plan Note (Signed)
 Home med pending to restart upon Pharmacy reconciliation  Asthma : VENTOLIN  inhaler, PROVENTIL  neb PRN HLD : Zetia  10 mg at bedtime, CRESTOR  5 mg Daily HTN : Home amLODipine  2.5 mg Daily CAD : Home ASA 81 mg daily Depression : Home FLUoxetine  10 mg daily [facility] : OLANZapine  2.5 mg QHS Gout : continue home allopurinol  100 mg daily GERD : Protonix  40 mg Daily

## 2024-05-15 NOTE — Assessment & Plan Note (Signed)
-   Admit to [facility], attending Dr. Madelon  - Vital signs per floor - Antibiotics: Continue Azithromycin  500 mg IV (11/22-11/24) and cefTRIAXone  2 g IV (11/22-11/26) - Pending Bcx result - Pain control: Tylenol  1000 mg Q6H PRN - AM Labs: CBC, BMP, Mg - Fall and Delirium precautions - PT/OT consult  - OOB, IS - Wean O2 to room air as tolerated

## 2024-05-15 NOTE — Plan of Care (Signed)

## 2024-05-15 NOTE — Assessment & Plan Note (Signed)
 State no good BM in the last 2 weeks - Bowel Reg: Senokot, MiRaLAX  - One time Dulcolax PR

## 2024-05-15 NOTE — ED Provider Notes (Signed)
 [place] EMERGENCY DEPARTMENT AT [facility] Provider Note   CSN: [id number] Arrival date & time: 05/15/24  0745     Patient presents with: Weakness   Randall Mccormick is a 68 y.o. male.   68 year old male with prior medical history as detailed below presents for evaluation.  Patient with 3 to 4 days of progressive weakness.  Patient is no longer able to get out of bed.  Apparently he is normally able to ambulate with a rollator.  Over the last 3 days he has been unable to walk secondary to weakness.  Fevers reported at home.  EMS noted temperature of 102 on arrival.  Patient was also hypoxic to 88%.  Patient does not require baseline FiO2 supplementation.  Patient complains of profound weakness.  He denies pain otherwise.  He denies nausea or vomiting.  He denies diarrhea.  Tylenol  was given by EMS prior to arrival.  The history is provided by the patient and medical records.       Prior to Admission medications   Medication Sig Start Date End Date Taking? Authorizing Provider  albuterol  (PROVENTIL ) (2.5 MG/3ML) 0.083% nebulizer solution Take 2.5 mg by nebulization every 6 (six) hours as needed for wheezing or shortness of breath.    [provider]  allopurinol  (ZYLOPRIM ) 100 MG tablet Take 100 mg by mouth daily.    [provider]  ALPRAZolam  (XANAX ) 1 MG tablet Take 1 mg by mouth 3 (three) times daily as needed for anxiety.    [provider]  amLODipine  (NORVASC ) 2.5 MG tablet Take 2.5 mg by mouth daily.    [provider]  aspirin  EC 81 MG tablet Take 81 mg by mouth daily. Swallow whole.    [provider]  Baclofen  5 MG TABS Take 5 mg by mouth daily.    [provider]  ezetimibe  (ZETIA ) 10 MG tablet Take 10 mg by mouth at bedtime. 04/29/20   [provider]  FLUoxetine  (PROZAC ) 10 MG capsule Take 10 mg by mouth daily.    [provider]  fluticasone (FLONASE) 50 MCG/ACT nasal spray Place 1 spray  into both nostrils 2 (two) times daily.    [provider]  folic acid  (FOLVITE ) 1 MG tablet Take 1 mg by mouth daily. 07/29/23   [provider]  gabapentin  (NEURONTIN ) 100 MG capsule Take 100 mg by mouth 3 (three) times daily.    [provider]  meloxicam  (MOBIC ) 7.5 MG tablet Take 7.5 mg by mouth daily.    [provider]  mupirocin  ointment (BACTROBAN ) 2 % Apply 1 Application topically 2 (two) times daily. 04/01/24   Lamount Ethan CROME, DPM  OLANZapine  (ZYPREXA ) 2.5 MG tablet Take 2.5 mg by mouth at bedtime.    [provider]  oxycodone  (OXY-IR) 5 MG capsule Take 5 mg by mouth every 4 (four) hours as needed for pain.    [provider]  pantoprazole  (PROTONIX ) 40 MG tablet Take 40 mg by mouth daily. 08/25/23   [provider]  polyethylene glycol (MIRALAX  / GLYCOLAX ) 17 g packet Take 17 g by mouth 3 (three) times daily. 12/27/23   Ghimire, Donalda HERO, MD  rosuvastatin  (CRESTOR ) 5 MG tablet Take 1 tablet (5 mg total) by mouth daily. 03/23/24 06/21/24  Madireddy, Alean SAUNDERS, MD  tamsulosin  (FLOMAX ) 0.4 MG CAPS capsule Take 1 capsule (0.4 mg total) by mouth daily after supper. 12/27/23   Ghimire, Donalda HERO, MD  VENTOLIN  HFA 108 (90 Base) MCG/ACT inhaler  Inhale 1-2 puffs into the lungs every 4 (four) hours as needed for wheezing or shortness of breath. 08/22/23   [provider]    Allergies: Patient has no known allergies.    Review of Systems  All other systems reviewed and are negative.   Updated Vital Signs BP 126/60   Pulse (!) 107   Temp 98.4 F (36.9 C)   Resp 16   SpO2 92%   Physical Exam Vitals and nursing note reviewed.  Constitutional:      General: He is not in acute distress.    Appearance: He is well-developed.  HENT:     Head: Normocephalic and atraumatic.  Eyes:     Conjunctiva/sclera: Conjunctivae normal.  Cardiovascular:     Rate and Rhythm: Regular rhythm. Tachycardia present.     Heart sounds: No  murmur heard. Pulmonary:     Effort: Pulmonary effort is normal. No respiratory distress.     Comments: Decreased breath sounds at bilateral bases Abdominal:     Palpations: Abdomen is soft.     Tenderness: There is no abdominal tenderness.  Musculoskeletal:        General: No swelling.     Cervical back: Neck supple.  Skin:    General: Skin is warm and dry.     Capillary Refill: Capillary refill takes less than 2 seconds.  Neurological:     Mental Status: He is alert.  Psychiatric:        Mood and Affect: Mood normal.     (all labs ordered are listed, but only abnormal results are displayed) Labs Reviewed  CULTURE, BLOOD (ROUTINE X 2)  CULTURE, BLOOD (ROUTINE X 2)  RESP PANEL BY RT-PCR (RSV, FLU A&B, COVID)  RVPGX2  COMPREHENSIVE METABOLIC PANEL WITH GFR  CBC WITH DIFFERENTIAL/PLATELET  RAPID URINE DRUG SCREEN, HOSP PERFORMED  URINALYSIS, W/ REFLEX TO CULTURE (INFECTION SUSPECTED)  BRAIN NATRIURETIC PEPTIDE  I-STAT CHEM 8, ED  I-STAT CG4 LACTIC ACID, ED  TROPONIN I (HIGH SENSITIVITY)    EKG: EKG Interpretation Date/Time:  Saturday May 15 2024 07:53:38 EST Ventricular Rate:  102 PR Interval:  166 QRS Duration:  98 QT Interval:  353 QTC Calculation: 460 R Axis:   120  Text Interpretation: Sinus tachycardia Probable left atrial enlargement Right axis deviation Borderline T abnormalities, anterior leads Confirmed by Laurice Coy [phone]) on 05/15/2024 7:54:22 AM  Radiology: No results found.   Procedures   Medications Ordered in the ED  sodium chloride  0.9 % bolus 500 mL (has no administration in time range)  cefTRIAXone  (ROCEPHIN ) 1 g in sodium chloride  0.9 % 100 mL IVPB (has no administration in time range)  azithromycin  (ZITHROMAX ) 500 mg in sodium chloride  0.9 % 250 mL IVPB (has no administration in time range)                                    Medical Decision Making Patient presents with fever, weakness, hypoxia.  Patient can no longer ambulate  secondary to his weakness.  EMS noted hypoxia to the 80s on room air.  Patient was febrile to 102.  Clinically, the patient has pneumonia.  Antibiotics initiated in the ED for treatment of suspected CAP.  Patient will benefit from admission.  Teaching service is aware of case and evaluate for admission.  Amount and/or Complexity of Data Reviewed Labs: ordered. Radiology: ordered.  Risk Prescription drug management. Decision regarding hospitalization.  Final diagnoses:  Weakness  Community acquired pneumonia, unspecified laterality    ED Discharge Orders     None          Laurice Maude BROCKS, MD 05/15/24 1140

## 2024-05-15 NOTE — H&P (Cosign Needed Addendum)
 Hospital Admission History and Physical Service Pager: [phone]  Patient name: Randall Mccormick Medical record number: [id number] Date of Birth: 10-11-1955 Age: 68 y.o. Gender: male  Primary Care Provider: Keren [name], MD  Consultants: None Code Status: Full Code which was confirmed with family if patient unable to confirm   Preferred Emergency Contact:  Extended Emergency Contact Information Primary Emergency Contact: Caleb, Decock Address: [address]          [place] [address] [place] Home Phone: [phone] Mobile Phone: [phone] Relation: Spouse   Secondary Emergency Contact: Thielmann,Jennifer Mobile Phone: [phone] Relation: Daughter   Chief Complaint: Worsening SOB  Differential and Medical Decision Making:  Randall Mccormick is a 68 y.o. male w/ PMH of Anxiety, HLD, HTN, Depression, Gout, Neurogenic B&B, and Incomplete paraplegia who presenting with worsening SOB.  Differential for this patient's presentation of this includes   Pneumonia : Possible aspiration pneumonia.  Patient present with worsening SOB. Per patient, he has been feeling weaker in the last few days, unable to get OOB and ambulate w/ rollator w/ low grade fever at home and 102F on arrival. He is now required oxygen to maintain goal oxygen saturation at 92%. Diminish breath sounds especially at lung bases. CXR on admission show patchy left lung base opacity, possibly related to low lung volumes. Bcx is pending. Not currently meet Sepsis criteria. No Hypotensive nor tachycardia  Viral Upper Respiratory Infection : Less likely patient denied Running nose, sore throat, cough, or sneezing. No HA or body ache. Respiratory panel are negative for COVID and Influenza   Lung malignancy : Less likely. Denies unintended weight lose, no Hx of smoking. While CT chest shows  2 mm lung nodules within the apical segment of the upper lung zones are nonspecific. Per radiology patient at low risk,  no further follow-up is indicated. If the patient is at increased risk a follow-up CT of the chest without contrast material in 12 months may be considered.  Pulmonary Embolism : less likely No PE found on CT chest PE  CHF exacerbation : While pt w/ LE pitting edema 2+ bilaterally this is Less likely. Pt w/o cardiac disease Hx. Normal BNP on admission. Echo in 04/14/24 shows normal pumping and relaxation function of the heart.  No major valve abnormalities. Overall reassuring results.  Assessment & Plan Pneumonia SOB (shortness of breath) - Admit to FMTS, attending Dr. Madelon  - Vital signs per floor - Antibiotics: Continue Azithromycin  500 mg IV (11/22-11/24) and cefTRIAXone  2 g IV (11/22-11/26) - Pending Bcx result - Pain control: Tylenol  1000 mg Q6H PRN - AM Labs: CBC, BMP, Mg - Fall and Delirium precautions - PT/OT consult  - OOB, IS - Wean O2 to room air as tolerated C/O Cirrhosis. Hx of daily Alcohol consumption Drink at home daily. Last drink was yesterday 05/14/24 CTAP on admission shows Caudate lobe and lateral segment of the left lobe of the liver hypertrophy with widening of the falciform ligament. No focal liver lesions identified. - Pending RUQ Liver US  - AM CMP - trend LFT, PT/INR - Starting Thiamine  100 mg w/ Folic acid  - Continue to monitor withdraw s/s per CIWA protocol - Consult TOC - No Ativan  needed at this time Constipation State no good BM in the last 2 weeks - Bowel Reg: Senokot, MiRaLAX  - One time Dulcolax PR Chronic health problem Home med pending to restart upon Pharmacy reconciliation  Asthma : VENTOLIN  inhaler, PROVENTIL  neb PRN HLD : Zetia  10  mg at bedtime, CRESTOR  5 mg Daily HTN : Home amLODipine  2.5 mg Daily CAD : Home ASA 81 mg daily Depression : Home FLUoxetine  10 mg daily SleepAid : OLANZapine  2.5 mg QHS Gout : continue home allopurinol  100 mg daily GERD : Protonix  40 mg Daily   FEN/GI: Regular VTE Prophylaxis: Lovenox   Disposition:  Med Surg  History of Present Illness:  Randall Mccormick is a 68 y.o. male PMH of Anxiety, HLD, HTN, Depression, Gout, Neurogenic B&B, and Incomplete paraplegia who BIB EMS  with worsening SOB. Per patient, he has been feeling weaker in the last few days, unable to get OOB and ambulate w/ rollator w/ low grade fever at home. Per EMS report his temp was 102F, tachycardia, and hypoxia as they arrived to his home.   In the ED, he is hemodynamically stable pertinent labs include ++ Benzo on UDS, WBC 17.2 Alk Phos 127 AST 88 ALT 47 w/ T bilirubin of 1.5  Review Of Systems: Per HPI   Pertinent Past Medical History: Asthma : VENTOLIN  inhaler, PROVENTIL  neb PRN HLD : Zetia  10 mg at bedtime, CRESTOR  5 mg Daily HTN : Home amLODipine  2.5 mg Daily CAD : Home ASA 81 mg daily Depression : Home FLUoxetine  10 mg daily SleepAid : OLANZapine  2.5 mg QHS Gout : continue home allopurinol  100 mg daily GERD : Protonix  40 mg Daily Neurogenic B&B Incomplete paraplegia   Remainder reviewed in history tab.   Pertinent Past Surgical History: Appendectomy  Back surgery x 6 Cholecystectomy (2023) Tonsilectomy Right Total Knee Replacement  Remainder reviewed in history tab.    Pertinent Social History: Tobacco use: Denies Alcohol use: Daily last drink was 11/21 Other Substance use: Denies Lives with Wife  Pertinent Family History: None contributing.   Important Outpatient Medications: VENTOLIN  inhaler PROVENTIL  neb PRN Zetia  10 mg at bedtime CRESTOR  5 mg Daily amLODipine  2.5 mg Daily ASA 81 mg daily FLUoxetine  10 mg daily OLANZapine  2.5 mg at bedtime allopurinol  100 mg daily Protonix  40 mg Daily   Objective: BP 124/72   Pulse 83   Temp 98.4 F (36.9 C)   Resp 15   SpO2 99%  Exam: General: Non toxic, As age Cardiovascular: RRR Respiratory: No distress on 2L [place] Gastrointestinal: Soft, no tenderness, Last BM on 11/21 but no good BM in the last 2 wks per wife Neuro: AOx4 , Intact  Psych:  Appropriate behavior and judgement  Extremities 2+ pitting edema bilaterally  Labs:  CBC BMET  Recent Labs  Lab 05/15/24 0755 05/15/24 0807  WBC 17.2*  --   HGB 15.2 17.0  HCT 46.7 50.0  PLT 238  --    Recent Labs  Lab 05/15/24 0755 05/15/24 0807  NA 139 141  K 4.0 3.9  CL 101 99  CO2 27  --   BUN 13 17  CREATININE 0.78 0.90  GLUCOSE 117* 126*  CALCIUM  8.7*  --       Imaging Studies Performed: EXAM: CTA CHEST PE WITHOUT AND WITH CONTRAST CT ABDOMEN AND PELVIS WITHOUT AND WITH CONTRAST 05/15/2024 10:32:56 AM  IMPRESSION: 1. No evidence of pulmonary embolism. 2. No acute abdominal or pelvic process. 3. Mild nonspecific gaseous distention of the colon. 4. Aortic atherosclerosis and coronary artery calcification . 5. 2 mm lung nodules within the apical segment of the upper lung zones are nonspecific. In a patient who was at low risk, no further follow-up is indicated. If the patient is at increased risk a follow-up CT of the chest without  contrast material in 12 months may be considered. 6. Morphologic features of the liver which may reflect early cirrhosis. 7. Status post cholecystectomy.   Electronically signed by: Waddell Calk MD 05/15/2024 11:04 AM EST RP Workstation: [id number]  Narrative & Impression EXAM: 1 VIEW(S) XRAY OF THE CHEST 05/15/2024 08:43:00 AM  IMPRESSION: 1. Patchy left lung base opacity, possibly related to low lung volumes. 2. Asymmetric elevation of the right hemidiaphragm.  Suzen Houston NOVAK, DO 05/15/2024, 11:38 AM PGY-1, [facility]  FPTS Intern pager: [phone], text pages welcome Secure chat group [facility] Teaching Service    I have reviewed the above note, agree with its content, and have made the appropriate changes.   Damien Pinal, DO [facility], PGY-3 05/15/24 1:46 PM

## 2024-05-15 NOTE — Hospital Course (Addendum)
 Randall Mccormick is a 68 y.o.male with a history of Anxiety, HLD, HTN, Depression, Gout, Neurogenic B&B, and Incomplete paraplegia who was admitted to the [facility] Teaching Service at [facility] for worsening SOB likely pneumonia . His hospital course is detailed below:  Pneumonia Patient present with worsening SOB. Per patient, he has been feeling weaker in the last few days, unable to get OOB and ambulate w/ rollator w/ low grade fever at home and 102F on arrival. He is now required oxygen to maintain goal oxygen saturation at 92%. Diminish breath sounds especially at lung bases. CXR on admission show patchy left lung base opacity, possibly related to low lung volumes. Bcx is pending. He was placed on Azithromycin  500 mg IV and cefTRIAXone  2 g IV then transition to Oral Augmentin  from 11/22 to 11/26 for 5 days total  Constipation Patient and his wife states last small BM was on 05/14/24, however patient have not been having a regular BM for 2 weeks. We starts patient on bowel regimen and gave Dulcolax suppository once   3. Dysphagia/Risk for aspiration. Barium swallow shows microaspiration, likely with some solid particulates at times. The cause of dysphagia will not be resolved with therapy or medical intervention - but therapy will help him compensate and reduce aspiration risk. He can continue with thin liquids and soft moist solids. Referral placed for Outpatient Speech Therapy  Other chronic conditions were medically managed with home medications and formulary alternatives as necessary   PCP Follow-up Recommendations: Consider reducing Xanax  F/u w/ Outpatient speech therapy

## 2024-05-15 NOTE — Progress Notes (Signed)
 Pt arrived from ED.  He is alert and oriented x4. Pt is pleasant. He reports pain in his legs 4/10. Resolved with PRN tylenol .  Vitals WNL. O2 saturation at 95% on Room Air. Pt oriented to room and equipment. He is currently in bed, watching TV. Pt has no concerns at this time.

## 2024-05-15 NOTE — Plan of Care (Signed)
  Problem: Education: Goal: Knowledge of General Education information will improve Description: Including pain rating scale, medication(s)/side effects and non-pharmacologic comfort measures Outcome: Progressing   Problem: Clinical Measurements: Goal: Ability to maintain clinical measurements within normal limits will improve Outcome: Progressing   Problem: Clinical Measurements: Goal: Will remain free from infection Outcome: Progressing   Problem: Activity: Goal: Risk for activity intolerance will decrease Outcome: Progressing   Problem: Nutrition: Goal: Adequate nutrition will be maintained Outcome: Progressing   Problem: Coping: Goal: Level of anxiety will decrease Outcome: Progressing   Problem: Elimination: Goal: Will not experience complications related to bowel motility Outcome: Progressing   Problem: Elimination: Goal: Will not experience complications related to urinary retention Outcome: Progressing   Problem: Skin Integrity: Goal: Risk for impaired skin integrity will decrease Outcome: Progressing

## 2024-05-15 NOTE — Plan of Care (Signed)
 FMTS Brief Progress Note  S: Went to evaluate patient.  Doing well on room air.  No acute concerns.   O: BP (!) 146/74 (BP Location: Left Arm)   Pulse 80   Temp 98.2 F (36.8 C) (Oral)   Resp 16   Ht 5' 8 (1.727 m)   Wt 99.8 kg   SpO2 95%   BMI 33.45 kg/m    General: NAD, resting comfortably Cardio: RRR, no murmurs Respiratory: CTAB, normal work breathing on room air  A/P: -Monitor CIWA overnight -S/p IV antibiotics, continue -Rest of plan as outlined by H&P from day team - Orders reviewed. Labs for AM ordered, which was adjusted as needed.  - If condition changes, plan includes page family medicine teaching service  Howell Lunger, DO 05/15/2024, 7:41 PM PGY-3, [facility] Night Resident  Please page [phone] with questions.

## 2024-05-15 NOTE — Assessment & Plan Note (Addendum)
 Drink at home daily. Last drink was yesterday 05/14/24 CTAP on admission shows Caudate lobe and lateral segment of the left lobe of the liver hypertrophy with widening of the falciform ligament. No focal liver lesions identified. - Pending RUQ Liver US  - AM CMP - trend LFT, PT/INR - Starting Thiamine  100 mg w/ Folic acid  - Continue to monitor withdraw s/s per CIWA protocol - Consult TOC - No Ativan  needed at this time

## 2024-05-16 DIAGNOSIS — R531 Weakness: Secondary | ICD-10-CM

## 2024-05-16 DIAGNOSIS — J189 Pneumonia, unspecified organism: Secondary | ICD-10-CM | POA: Diagnosis not present

## 2024-05-16 DIAGNOSIS — Z789 Other specified health status: Secondary | ICD-10-CM

## 2024-05-16 LAB — CBC
HCT: 41.4 % (ref 39.0–52.0)
Hemoglobin: 13.7 g/dL (ref 13.0–17.0)
MCH: 31 pg (ref 26.0–34.0)
MCHC: 33.1 g/dL (ref 30.0–36.0)
MCV: 93.7 fL (ref 80.0–100.0)
Platelets: 201 K/uL (ref 150–400)
RBC: 4.42 MIL/uL (ref 4.22–5.81)
RDW: 14.8 % (ref 11.5–15.5)
WBC: 8.9 K/uL (ref 4.0–10.5)
nRBC: 0 % (ref 0.0–0.2)

## 2024-05-16 LAB — BASIC METABOLIC PANEL WITH GFR
Anion gap: 10 (ref 5–15)
BUN: 9 mg/dL (ref 8–23)
CO2: 26 mmol/L (ref 22–32)
Calcium: 8.5 mg/dL — ABNORMAL LOW (ref 8.9–10.3)
Chloride: 106 mmol/L (ref 98–111)
Creatinine, Ser: 0.77 mg/dL (ref 0.61–1.24)
GFR, Estimated: >60 mL/min (ref >60)
Glucose, Bld: 109 mg/dL — ABNORMAL HIGH (ref 70–99)
Potassium: 3.3 mmol/L — ABNORMAL LOW (ref 3.5–5.1)
Sodium: 142 mmol/L (ref 135–145)

## 2024-05-16 LAB — MAGNESIUM: Magnesium: 2.1 mg/dL (ref 1.7–2.4)

## 2024-05-16 LAB — HEPATIC FUNCTION PANEL
ALT: 46 U/L — ABNORMAL HIGH (ref 0–44)
AST: 56 U/L — ABNORMAL HIGH (ref 15–41)
Albumin: 2.7 g/dL — ABNORMAL LOW (ref 3.5–5.0)
Alkaline Phosphatase: 108 U/L (ref 38–126)
Bilirubin, Direct: 0.2 mg/dL (ref 0.0–0.2)
Indirect Bilirubin: 1 mg/dL — ABNORMAL HIGH (ref 0.3–0.9)
Total Bilirubin: 1.2 mg/dL (ref 0.0–1.2)
Total Protein: 5.9 g/dL — ABNORMAL LOW (ref 6.5–8.1)

## 2024-05-16 LAB — PROTIME-INR
INR: 1.2 (ref 0.8–1.2)
Prothrombin Time: 15.8 s — ABNORMAL HIGH (ref 11.4–15.2)

## 2024-05-16 MED ORDER — AZITHROMYCIN 500 MG PO TABS
500.0000 mg | ORAL_TABLET | Freq: Every day | ORAL | Status: AC
  Administered 2024-05-17: 500 mg via ORAL
  Filled 2024-05-16: qty 1

## 2024-05-16 MED ORDER — EZETIMIBE 10 MG PO TABS
10.0000 mg | ORAL_TABLET | Freq: Every day | ORAL | Status: DC
Start: 2024-05-16 — End: 2024-05-17
  Administered 2024-05-16: 10 mg via ORAL
  Filled 2024-05-16: qty 1

## 2024-05-16 MED ORDER — AMOXICILLIN-POT CLAVULANATE 875-125 MG PO TABS
1.0000 | ORAL_TABLET | Freq: Two times a day (BID) | ORAL | Status: DC
  Administered 2024-05-17: 1 via ORAL
  Filled 2024-05-16: qty 1

## 2024-05-16 MED ORDER — ROSUVASTATIN CALCIUM 5 MG PO TABS
5.0000 mg | ORAL_TABLET | Freq: Every day | ORAL | Status: DC
  Administered 2024-05-16 – 2024-05-17 (×2): 5 mg via ORAL
  Filled 2024-05-16 (×2): qty 1

## 2024-05-16 MED ORDER — POTASSIUM CHLORIDE CRYS ER 20 MEQ PO TBCR
40.0000 meq | EXTENDED_RELEASE_TABLET | Freq: Once | ORAL | Status: AC
  Administered 2024-05-16: 40 meq via ORAL
  Filled 2024-05-16: qty 2

## 2024-05-16 MED ORDER — OLANZAPINE 2.5 MG PO TABS
2.5000 mg | ORAL_TABLET | Freq: Every day | ORAL | Status: DC
  Administered 2024-05-16: 2.5 mg via ORAL
  Filled 2024-05-16 (×2): qty 1

## 2024-05-16 NOTE — Assessment & Plan Note (Addendum)
-   Admit to FMTS, attending Dr. Rumball  - Antibiotics: Azithromycin  IV (11/22-) and cefTRIAXone  IV (11/22-), consider transition to PO - Pain control: Tylenol  1000 mg Q6H PRN - AM Labs: CBC, BMP, Mg - Fall and Delirium precautions - PT/OT consult  - OOB, IS - Wean O2 to room air as tolerated

## 2024-05-16 NOTE — Assessment & Plan Note (Addendum)
-   Bowel Reg: Senokot, MiRaLAX

## 2024-05-16 NOTE — Assessment & Plan Note (Addendum)
-   RUQ unremarkable - Liver enzymes slight trend down - MELD 8.0 - Continue to monitor CIWA

## 2024-05-16 NOTE — Assessment & Plan Note (Addendum)
 Asthma : PROVENTIL  neb PRN HLD : Zetia  10 mg at bedtime, CRESTOR  5 mg Daily HTN : Home amLODipine  2.5 mg Daily CAD : Home ASA 81 mg daily Depression : Home FLUoxetine  10 mg daily SleepAid : Home OLANZapine  2.5 mg QHS Gout : continue home allopurinol  100 mg daily GERD : Protonix  40 mg Daily

## 2024-05-16 NOTE — Progress Notes (Signed)
     Daily Progress Note Intern Pager: [phone]  Patient name: Randall Mccormick Medical record number: [id number] Date of birth: December 22, 1955 Age: 68 y.o. Gender: male  Primary Care Provider: Keren [name], MD Consultants: None Code Status: Full Code  Pt Overview and Major Events to Date:  11/22: Admitted  Assessment and Plan:  68 year old male admitted for shortness of breath suspected to be pneumonia, doing well today.  Noted elevated LFTs, CTAP suggestive of cirrhosis, right upper quadrant ultrasound unremarkable. Assessment & Plan Pneumonia SOB (shortness of breath) - Admit to FMTS, attending Dr. Rumball  - Antibiotics: Azithromycin  IV (11/22-) and cefTRIAXone  IV (11/22-), consider transition to PO - Pain control: Tylenol  1000 mg Q6H PRN - AM Labs: CBC, BMP, Mg - Fall and Delirium precautions - PT/OT consult  - OOB, IS - Wean O2 to room air as tolerated C/O Cirrhosis. Hx of daily Alcohol consumption - RUQ unremarkable - Liver enzymes slight trend down - MELD 8.0 - Continue to monitor CIWA Constipation - Bowel Reg: Senokot, MiRaLAX  Chronic health problem Asthma : PROVENTIL  neb PRN HLD : Zetia  10 mg at bedtime, CRESTOR  5 mg Daily HTN : Home amLODipine  2.5 mg Daily CAD : Home ASA 81 mg daily Depression : Home FLUoxetine  10 mg daily SleepAid : Home OLANZapine  2.5 mg QHS Gout : continue home allopurinol  100 mg daily GERD : Protonix  40 mg Daily  FEN/GI: Regular PPx: Lovenox  Dispo: Pending PT/OT recs  Subjective:  Sleeping comfortably in bed, easily arousable.  No concerns.  Objective: Temp:  [97.9 F (36.6 C)-98.4 F (36.9 C)] 98.2 F (36.8 C) (11/22 1512) Pulse Rate:  [73-107] 80 (11/23 0000) Resp:  [14-19] 16 (11/22 1512) BP: (117-150)/(60-84) 150/74 (11/23 0000) SpO2:  [92 %-100 %] 95 % (11/22 1512) Weight:  [99.8 kg] 99.8 kg (11/22 1545) Physical Exam: General: NAD, resting in bed Cardiovascular: RRR Respiratory: CTAB, normal breathing room  air Abdomen: Soft, not tender, not tender  Laboratory: Most recent CBC Lab Results  Component Value Date   WBC 17.2 (H) 05/15/2024   HGB 17.0 05/15/2024   HCT 50.0 05/15/2024   MCV 95.1 05/15/2024   PLT 238 05/15/2024   Most recent BMP    Latest Ref Rng & Units 05/15/2024    8:07 AM  BMP  Glucose 70 - 99 mg/dL 873   BUN 8 - 23 mg/dL 17   Creatinine 9.38 - 1.24 mg/dL 9.09   Sodium 864 - 854 mmol/L 141   Potassium 3.5 - 5.1 mmol/L 3.9   Chloride 98 - 111 mmol/L 99     Howell Lunger, DO 05/16/2024, 12:28 AM  PGY-3, [facility] FPTS Intern pager: [phone], text pages welcome Secure chat group [facility] Teaching Service

## 2024-05-16 NOTE — Progress Notes (Signed)
 Patient is on CIWA precautions, he is asymptomatic this shift. He denies anxiety, has no tremors, and is sleeping without any difficulty.  Blood pressure is slightly elevated at 150/92 and patient has Norvasc  ordered 2.5 mg daily.  Continue to follow plan of care, bed low position, call light in reach, respirations even and unlabored

## 2024-05-16 NOTE — Evaluation (Signed)
 Occupational Therapy Evaluation Patient Details Name: Randall Mccormick MRN: [id number] DOB: 1956-01-06 Today's Date: 05/16/2024   History of Present Illness   Randall Mccormick is a 68 y.o. male who presenting with worsening SOB; noting pneumonia and cirrhosis; w/ PMH of Anxiety, HLD, HTN, Depression, Gout, Neurogenic B&B, and Incomplete paraplegia     Clinical Impressions Pt reports having assist at baseline for ADLs and ambulates with rollator at home. Pt lives with spouse. Pt currently needs up to max A for ADLs, min A for bed mobility and mod-max A for transfers with rollator. Pt with poor safety awareness and bil knee pain, pt unable to safely reach back for bed to sit, instead quickly descends before letting go of rollator, needs max A to control descent to bed. Pt presenting with impairments listed below, will follow acutely. Recommend HHOT at d/c pending progression.     If plan is discharge home, recommend the following:   A lot of help with walking and/or transfers;A lot of help with bathing/dressing/bathroom;Assistance with cooking/housework;Direct supervision/assist for medications management;Direct supervision/assist for financial management;Assist for transportation;Help with stairs or ramp for entrance     Functional Status Assessment   Patient has had a recent decline in their functional status and demonstrates the ability to make significant improvements in function in a reasonable and predictable amount of time.     Equipment Recommendations   None recommended by OT     Recommendations for Other Services   PT consult     Precautions/Restrictions   Precautions Precautions: Fall Recall of Precautions/Restrictions: Intact Restrictions Weight Bearing Restrictions Per Provider Order: No     Mobility Bed Mobility Overal bed mobility: Needs Assistance Bed Mobility: Sit to Supine       Sit to supine: Min assist   General bed mobility comments: to assist  BLEs into bed    Transfers Overall transfer level: Needs assistance Equipment used: Rollator (4 wheels) Transfers: Sit to/from Stand, Bed to chair/wheelchair/BSC Sit to Stand: Mod assist Stand pivot transfers: Max assist         General transfer comment: mod A to rise from chair, max A for pivot as pt not reaching back for bed and collapsing onto bed and pulling rollator, does not set brakes      Balance Overall balance assessment: Needs assistance Sitting-balance support: Feet supported, Bilateral upper extremity supported Sitting balance-Leahy Scale: Fair       Standing balance-Leahy Scale: Poor                             ADL either performed or assessed with clinical judgement   ADL Overall ADL's : Needs assistance/impaired Eating/Feeding: Set up   Grooming: Set up   Upper Body Bathing: Minimal assistance   Lower Body Bathing: Moderate assistance   Upper Body Dressing : Minimal assistance   Lower Body Dressing: Moderate assistance   Toilet Transfer: Moderate assistance   Toileting- Clothing Manipulation and Hygiene: Maximal assistance       Functional mobility during ADLs: Moderate assistance;Rollator (4 wheels)       Vision   Vision Assessment?: No apparent visual deficits     Perception Perception: Not tested       Praxis Praxis: Not tested       Pertinent Vitals/Pain Pain Assessment Pain Assessment: Faces Pain Score: 4  Pain Location: bil knees Pain Descriptors / Indicators: Discomfort Pain Intervention(s): Limited activity within patient's tolerance, Monitored during session, Repositioned  Extremity/Trunk Assessment Upper Extremity Assessment Upper Extremity Assessment: Generalized weakness   Lower Extremity Assessment Lower Extremity Assessment: Defer to PT evaluation   Cervical / Trunk Assessment Cervical / Trunk Assessment: Kyphotic   Communication Communication Communication: No apparent difficulties    Cognition Arousal: Alert Behavior During Therapy: WFL for tasks assessed/performed Cognition: No apparent impairments                               Following commands: Intact       Cueing  General Comments   Cueing Techniques: Verbal cues  VSS   Exercises     Shoulder Instructions      Home Living Family/patient expects to be discharged to:: Private residence Living Arrangements: Spouse/significant other Available Help at Discharge: Family;Available 24 hours/day Type of Home: House Home Access: Ramped entrance     Home Layout: One level     Bathroom Shower/Tub: Producer, Television/film/video: Handicapped height Bathroom Accessibility: Yes   Home Equipment: Agricultural Consultant (2 wheels);Rollator (4 wheels);Cane - single point;Shower seat;BSC/3in1;Grab bars - toilet;Wheelchair - manual;Grab bars - tub/shower;Lift chair;Adaptive equipment Adaptive Equipment: Reacher;Sock aid        Prior Functioning/Environment Prior Level of Function : Needs assist             Mobility Comments: walks with assist and rollator, sleeps in lift chair ADLs Comments: Pt can self feed,extensive assist for all other ADLs and IADLs.    OT Problem List: Decreased strength;Decreased range of motion;Decreased activity tolerance;Impaired balance (sitting and/or standing);Decreased cognition;Decreased safety awareness;Cardiopulmonary status limiting activity   OT Treatment/Interventions: Self-care/ADL training;Therapeutic exercise;Energy conservation;DME and/or AE instruction;Therapeutic activities;Patient/family education;Balance training      OT Goals(Current goals can be found in the care plan section)   Acute Rehab OT Goals Patient Stated Goal: none stated OT Goal Formulation: With patient Time For Goal Achievement: 05/30/24 Potential to Achieve Goals: Good ADL Goals Pt Will Perform Upper Body Dressing: with supervision;sitting Pt Will Perform Lower Body Dressing:  with supervision;sitting/lateral leans;sit to/from stand Pt Will Transfer to Toilet: with contact guard assist;ambulating;regular height toilet Pt/caregiver will Perform Home Exercise Program: Both right and left upper extremity;With Supervision;With written HEP provided Additional ADL Goal #1: pt will perform bed mobiltiy supervision in prep for ADLs   OT Frequency:  Min 2X/week    Co-evaluation              AM-PAC OT 6 Clicks Daily Activity     Outcome Measure Help from another person eating meals?: A Little Help from another person taking care of personal grooming?: A Little Help from another person toileting, which includes using toliet, bedpan, or urinal?: A Lot Help from another person bathing (including washing, rinsing, drying)?: A Lot Help from another person to put on and taking off regular upper body clothing?: A Lot Help from another person to put on and taking off regular lower body clothing?: A Lot 6 Click Score: 14   End of Session Equipment Utilized During Treatment: Gait belt;Rollator (4 wheels) Nurse Communication: Mobility status  Activity Tolerance: Patient tolerated treatment well Patient left: in bed;with call bell/phone within reach;with bed alarm set;with family/visitor present  OT Visit Diagnosis: Unsteadiness on feet (R26.81);Other abnormalities of gait and mobility (R26.89);Muscle weakness (generalized) (M62.81)                Time: 1334-1400 OT Time Calculation (min): 26 min Charges:  OT General Charges $OT Visit: 1  Visit OT Evaluation $OT Eval Moderate Complexity: 1 Mod OT Treatments $Self Care/Home Management : 8-22 mins  Sadeel Fiddler K, OTD, OTR/L SecureChat Preferred Acute Rehab [phone]   Laneta MARLA Pereyra 05/16/2024, 3:32 PM

## 2024-05-16 NOTE — Evaluation (Signed)
 Physical Therapy Evaluation Patient Details Name: Praneeth Bussey MRN: [id number] DOB: [birth year] Today's Date: 05/16/2024  History of Present Illness  Mitcheal Sweetin is a 68 y.o. male who presenting with worsening SOB; noting pneumonia and cirrhosis; w/ PMH of Anxiety, HLD, HTN, Depression, Gout, Neurogenic B&B, and Incomplete paraplegia  Clinical Impression  Pt admitted with above diagnosis. Lives at home with his wife, in a single-level home with a remped entrance; Prior to admission, pt was able to walk short distance with his rollator, wife assist with bathing , dressing; Presents to PT with generalized weakness, decr activity tolerance, a decrease in overall functional level compared to his baseline; Today, pt needed light mod assist to power up to stand from low bed surface, mod assist to stabilize rollator and cues for safety as he was fatigued as we approached the recliner; He participates well, and is motivated to be able to get home; Will need to be at Supervision/Standby assist functional level to get home, as his wife cannot provide much physical assist; I anticipate good progress, and rec resuming HHPT follow up at DC;  Pt currently with functional limitations due to the deficits listed below (see PT Problem List). Pt will benefit from skilled PT to increase their independence and safety with mobility to allow discharge to the venue listed below.           If plan is discharge home, recommend the following: A little help with walking and/or transfers;A little help with bathing/dressing/bathroom;Assistance with cooking/housework   Can travel by private vehicle        Equipment Recommendations None recommended by PT (well-equipped)  Recommendations for Other Services  OT consult    Functional Status Assessment Patient has had a recent decline in their functional status and demonstrates the ability to make significant improvements in function in a reasonable and predictable amount of  time.     Precautions / Restrictions Precautions Precautions: Fall Recall of Precautions/Restrictions: Intact Restrictions Weight Bearing Restrictions Per Provider Order: No      Mobility  Bed Mobility Overal bed mobility: Needs Assistance Bed Mobility: Supine to Sit     Supine to sit: Mod assist     General bed mobility comments: Light mod assist to pull to sit; Bed mobility is less important for Mr. Rosas as he typically sleeps in a lift chair    Transfers Overall transfer level: Needs assistance Equipment used: Rollator (4 wheels) Transfers: Sit to/from Stand Sit to Stand: Mod assist           General transfer comment: light mod assist to power up and steady; stood from lowest bed height    Ambulation/Gait Ambulation/Gait assistance: Min assist, Mod assist Gait Distance (Feet): 12 Feet (around the bed to recliner) Assistive device: Rollator (4 wheels) Gait Pattern/deviations: Step-through pattern, Decreased stride length, Trunk flexed Gait velocity: slowed     General Gait Details: Used Rollator, including brakes, well; hips and trunk flexed in stance; Min assist for rollator management in small space; Light mod assist towards end of gait bout due to fatigue; sat down prematurely, but still made it sit on edge of recliner seat  Stairs            Wheelchair Mobility     Tilt Bed    Modified Rankin (Stroke Patients Only)       Balance Overall balance assessment: Needs assistance Sitting-balance support: Feet supported, Bilateral upper extremity supported Sitting balance-Leahy Scale: Fair       Standing balance-Leahy Scale:  Poor                               Pertinent Vitals/Pain Pain Assessment Pain Assessment: No/denies pain    Home Living Family/patient expects to be discharged to:: Private residence Living Arrangements: Spouse/significant other Available Help at Discharge: Family;Available 24 hours/day Type of Home:  House Home Access: Ramped entrance       Home Layout: One level Home Equipment: Agricultural Consultant (2 wheels);Rollator (4 wheels);Cane - single point;Shower seat;BSC/3in1;Grab bars - toilet;Wheelchair - manual;Grab bars - tub/shower;Lift chair;Adaptive equipment      Prior Function Prior Level of Function : Needs assist             Mobility Comments: walks with assist and rollator, sleeps in lift chair ADLs Comments: Pt can self feed,extensive assist for all other ADLs and IADLs.     Extremity/Trunk Assessment   Upper Extremity Assessment Upper Extremity Assessment: Defer to OT evaluation    Lower Extremity Assessment Lower Extremity Assessment: Generalized weakness (Noting difficulty fully extending hip in upright standing; reports this is typical for him)       Communication   Communication Communication: No apparent difficulties    Cognition Arousal: Alert Behavior During Therapy: WFL for tasks assessed/performed   PT - Cognitive impairments: No apparent impairments                         Following commands: Intact       Cueing Cueing Techniques: Verbal cues     General Comments General comments (skin integrity, edema, etc.): Session conducted on room air; O2 sats 97% at end of session    Exercises     Assessment/Plan    PT Assessment Patient needs continued PT services  PT Problem List Decreased strength;Decreased activity tolerance;Decreased mobility;Decreased balance;Decreased range of motion;Decreased coordination;Decreased knowledge of use of DME;Decreased safety awareness;Decreased knowledge of precautions;Cardiopulmonary status limiting activity       PT Treatment Interventions DME instruction;Gait training;Stair training;Functional mobility training;Therapeutic activities;Therapeutic exercise;Balance training;Neuromuscular re-education;Patient/family education;Manual techniques    PT Goals (Current goals can be found in the Care Plan  section)  Acute Rehab PT Goals Patient Stated Goal: Improve enough to get home PT Goal Formulation: With patient Time For Goal Achievement: 05/30/24 Potential to Achieve Goals: Good    Frequency Min 2X/week     Co-evaluation               AM-PAC PT 6 Clicks Mobility  Outcome Measure Help needed turning from your back to your side while in a flat bed without using bedrails?: None Help needed moving from lying on your back to sitting on the side of a flat bed without using bedrails?: A Little Help needed moving to and from a bed to a chair (including a wheelchair)?: A Little Help needed standing up from a chair using your arms (e.g., wheelchair or bedside chair)?: A Little Help needed to walk in hospital room?: A Little Help needed climbing 3-5 steps with a railing? : A Lot 6 Click Score: 18    End of Session Equipment Utilized During Treatment: Gait belt Activity Tolerance: Patient tolerated treatment well Patient left: in chair;with call bell/phone within reach (Chair alarm pad under pt, and he tells this PT he won't get up without assist) Nurse Communication: Mobility status;Other (comment) (Needs a chair alarm box to fully set up chair alarm) PT Visit Diagnosis: Unsteadiness on feet (  R26.81);Muscle weakness (generalized) (M62.81);Difficulty in walking, not elsewhere classified (R26.2)    Time: 9154-9094 PT Time Calculation (min) (ACUTE ONLY): 20 min   Charges:   PT Evaluation $PT Eval Moderate Complexity: 1 Mod   PT General Charges $$ ACUTE PT VISIT: 1 Visit         Silvano Currier, PT  Acute Rehabilitation Services Office [phone] Secure Chat welcomed   Silvano VEAR Currier 05/16/2024, 9:55 AM

## 2024-05-17 ENCOUNTER — Inpatient Hospital Stay (HOSPITAL_COMMUNITY): Payer: MEDICARE

## 2024-05-17 ENCOUNTER — Other Ambulatory Visit (HOSPITAL_COMMUNITY): Payer: Self-pay

## 2024-05-17 DIAGNOSIS — T17908A Unspecified foreign body in respiratory tract, part unspecified causing other injury, initial encounter: Secondary | ICD-10-CM

## 2024-05-17 DIAGNOSIS — R1319 Other dysphagia: Secondary | ICD-10-CM

## 2024-05-17 DIAGNOSIS — Z789 Other specified health status: Secondary | ICD-10-CM | POA: Diagnosis not present

## 2024-05-17 DIAGNOSIS — J189 Pneumonia, unspecified organism: Secondary | ICD-10-CM | POA: Diagnosis not present

## 2024-05-17 HISTORY — DX: Other dysphagia: R13.19

## 2024-05-17 HISTORY — DX: Unspecified foreign body in respiratory tract, part unspecified causing other injury, initial encounter: T17.908A

## 2024-05-17 LAB — COMPREHENSIVE METABOLIC PANEL WITH GFR
ALT: 36 U/L (ref 0–44)
AST: 33 U/L (ref 15–41)
Albumin: 2.8 g/dL — ABNORMAL LOW (ref 3.5–5.0)
Alkaline Phosphatase: 111 U/L (ref 38–126)
Anion gap: 9 (ref 5–15)
BUN: 8 mg/dL (ref 8–23)
CO2: 28 mmol/L (ref 22–32)
Calcium: 8.7 mg/dL — ABNORMAL LOW (ref 8.9–10.3)
Chloride: 104 mmol/L (ref 98–111)
Creatinine, Ser: 0.62 mg/dL (ref 0.61–1.24)
GFR, Estimated: >60 mL/min (ref >60)
Glucose, Bld: 84 mg/dL (ref 70–99)
Potassium: 3.4 mmol/L — ABNORMAL LOW (ref 3.5–5.1)
Sodium: 141 mmol/L (ref 135–145)
Total Bilirubin: 1 mg/dL (ref 0.0–1.2)
Total Protein: 6.4 g/dL — ABNORMAL LOW (ref 6.5–8.1)

## 2024-05-17 LAB — GLUCOSE, CAPILLARY: Glucose-Capillary: 129 mg/dL — ABNORMAL HIGH (ref 70–99)

## 2024-05-17 MED ORDER — AMOXICILLIN-POT CLAVULANATE 875-125 MG PO TABS
1.0000 | ORAL_TABLET | Freq: Two times a day (BID) | ORAL | 0 refills | Status: DC
  Filled 2024-05-17: qty 5, 3d supply, fill #0

## 2024-05-17 MED ORDER — POTASSIUM CHLORIDE CRYS ER 20 MEQ PO TBCR
40.0000 meq | EXTENDED_RELEASE_TABLET | Freq: Once | ORAL | Status: AC
  Administered 2024-05-17: 40 meq via ORAL
  Filled 2024-05-17: qty 2

## 2024-05-17 NOTE — Evaluation (Signed)
 Clinical/Bedside Swallow Evaluation Patient Details  Name: Randall Mccormick MRN: [id number] Date of Birth: 12-25-1955  Today's Date: 05/17/2024 Time: SLP Start Time (ACUTE ONLY): 1115 SLP Stop Time (ACUTE ONLY): 1133 SLP Time Calculation (min) (ACUTE ONLY): 18 min  Past Medical History:  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) 03/29/2022   Aspiration pneumonia (HCC) 04/12/2022   BMI 35.0-35.9,adult 03/02/2014   Chronic pain 12/24/2023   Chronic pain syndrome    Cobalamin deficiency    Constipation    Cord compression (HCC) 07/20/2020   Degeneration of lumbar or lumbosacral intervertebral disc    Degenerative lumbar spinal stenosis 11/23/2019   Depression    Difficulty walking 12/11/2022   Disorder of spinal region (HCC) 12/11/2022   Displacement of lumbar intervertebral disc without myelopathy 03/02/2014   Dupuytren's contracture of right hand 03/17/2019   Foot-drop 12/11/2022   Gouty arthropathy    IMO SNOMED Dx Update Oct 2024     Hematemesis with nausea    Hemorrhoids    Hyperkalemia    Hyperlipidemia    Hypertension    Hypoalbuminemia due to protein-calorie malnutrition    Incomplete paraplegia (HCC) 09/08/2020   Low back pain 05/12/2023   Lumbago with sciatica, left side 03/22/2024   Lumbago with sciatica, right side 03/22/2024   Lumbar spondylosis 11/03/2013   Multiple fractures of ribs of left side 04/20/2013   FALL FROM A LADDER ON    Muscle atrophy 12/11/2022   Neurogenic bladder    Neurogenic bowel    Neuropathic pain    OA (osteoarthritis) of knee 03/28/2022   Osteoarthritis of right hip 01/07/2014   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 04/24/2011   Pneumonia 12/24/2023   Pressure injury of skin 04/09/2022   Right hip pain 01/07/2014   S/P spinal fusion 07/20/2020   Sacroiliac dysfunction 11/03/2013   Sciatica 03/22/2024   Sepsis (HCC) 09/11/2023   Septic shock (HCC)    Spasticity 09/08/2020   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/2020   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/2020   Uremia 04/07/2022   Urinary retention 03/28/2021   Vitamin D deficiency    Weakness 12/24/2023   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/08/2020   Past Surgical History:  Past Surgical History:  Procedure Laterality Date   APPENDECTOMY     BACK SURGERY     x 6   CHOLECYSTECTOMY  06/24/2008   [facility]   ESOPHAGOGASTRODUODENOSCOPY N/A 04/06/2022   Procedure: ESOPHAGOGASTRODUODENOSCOPY (EGD);  Surgeon: Legrand Victory LITTIE DOUGLAS, MD;  Location: [facility] ENDOSCOPY;  Service: Gastroenterology;  Laterality: N/A;   TONSILLECTOMY     TOTAL KNEE ARTHROPLASTY Right 03/28/2022   Procedure: RIGHT TOTAL KNEE ARTHROPLASTY;  Surgeon: Addie Cordella Hamilton, MD;  Location: [facility] OR;  Service: Orthopedics;  Laterality: Right;   HPI:  Pt is a 68 year old male arriving for his second admission for pna this year. Pt has a 2-3 year history of difficulty swallowing, which he describes as solid food sticking (points to neck) eliciting coughing and regurgitation. Pt did have some kind of barium exam approximately in Spring of 2025 and also an endoscopy with dilitation which did  not relieve his symptoms.    Assessment / Plan / Recommendation  Clinical Impression  Pt presents with complaint of globus and solid food regurgitation for 2-3 years prior to admit. Under observation pt is able to consume 3 oz of water  without coughing or stopping. Audible swallow observed. Pt points to upper neck and says cough with solids is immediate with regurgitation. Given prolonged discomfort and provider concern for dysphagia related  aspiration, recommend instrumental assessment while admitted if possible to better direct pts care and best course for f/u. SLP Visit Diagnosis: Dysphagia, unspecified (R13.10)    Aspiration Risk  Mild aspiration risk    Diet Recommendation Regular;Thin liquid    Liquid Administration via: Cup;Straw    Other  Recommendations Recommended Consults: Consider GI evaluation     Assistance Recommended at Discharge    Functional Status Assessment    Frequency and Duration            Prognosis        Swallow Study   General HPI: Pt is a 68 year old male arriving for his second admission for pna this year. Pt has a 2-3 year history of difficulty swallowing, which he describes as solid food sticking (points to neck) eliciting coughing and regurgitation. Pt did have some kind of barium exam approximately in Spring of 2025 and also an endoscopy with dilitation which did not relieve his symptoms. Type of Study: Bedside Swallow Evaluation Previous Swallow Assessment: 12/24/23 - regular thin Diet Prior to this Study: Regular;Thin liquids (Level 0) Temperature Spikes Noted: No Respiratory Status: Room air History of Recent Intubation: No Behavior/Cognition: Alert;Cooperative;Pleasant mood Oral Cavity Assessment: Within Functional Limits Oral Care Completed by SLP: No Oral Cavity - Dentition: Adequate natural dentition Vision: Functional for self-feeding Self-Feeding Abilities: Able to feed self Patient Positioning: Upright in chair Baseline Vocal Quality: Normal Volitional Cough: Strong Volitional Swallow: Able to elicit    Oral/Motor/Sensory Function Overall Oral Motor/Sensory Function: Within functional limits   Ice Chips     Thin Liquid Thin Liquid: Within functional limits    Nectar Thick Nectar Thick Liquid: Not tested   Honey Thick Honey Thick Liquid: Not tested   Puree Puree: Not tested   Solid     Solid: Not tested      Selita Staiger, Consuelo Fitch 05/17/2024,1:32  PM

## 2024-05-17 NOTE — Progress Notes (Signed)
 DISCHARGE NOTE HOME Randall Mccormick to be discharged Home per MD order. Discussed prescriptions and follow up appointments with the patient. Prescriptions given to patient; medication list explained in detail. Patient verbalized understanding.  Skin clean, dry and intact without evidence of skin break down, no evidence of skin tears noted. IV catheter discontinued intact. Site without signs and symptoms of complications. Dressing and pressure applied. Pt denies pain at the site currently. No complaints noted.  See LDA for left toe wound at discharge Patient free of lines, drains, and wounds.   An After Visit Summary (AVS) was printed and given to the patient. Patient escorted via wheelchair, and discharged home via private auto.  Peyton SHAUNNA Pepper, RN

## 2024-05-17 NOTE — TOC Initial Note (Signed)
 Transition of Care [facility]) - Initial/Assessment Note    Patient Details  Name: Randall Mccormick MRN: [id number] Date of Birth: [birth year]  Transition of Care [facility]) CM/SW Contact:    Lauraine FORBES Saa, LCSWA Phone Number: 05/17/2024, 2:50 PM  Clinical Narrative:                  2:50 PM CSW introduced self and role to patient. Patient's spouse, [name], was also present at bedside. Patient consented CSW to speak to and in front of spouse. Patient accepted CSW of substance use resources. Patient and patient's spouse expressed interest in HHPT/OT services. Patient confirmed spouse is to transport him back to their home. Per chart review, patient has a PCP and insurance. Patient has CIR history with [facility]. Patient has SNF history with [facility]. Patient has HH history with [facility] and [facility]. Patient has DME (RW, wheelchair, hospital bed, lift chair) history with [facility]. Patient's preferred pharmacy's are [facility] [address] [facility], [facility] [place], and [facility] [place]. TOC will continue to follow.  Expected Discharge Plan: Home w Home Health Services Barriers to Discharge: Continued Medical Work up   Patient Goals and CMS Choice Patient states their goals for this hospitalization and ongoing recovery are:: to return home with [facility]          Expected Discharge Plan and Services In-house Referral: Clinical Social Work Discharge Planning Services: CM Consult Post Acute Care Choice: Home Health Living arrangements for the past 2 months: Single Family Home Expected Discharge Date: 05/17/24                                    Prior Living Arrangements/Services Living arrangements for the past 2 months: Single Family Home Lives with:: Spouse Patient language and need for interpreter reviewed:: Yes Do you feel safe going back to the place where you live?: Yes      Need for Family Participation in Patient  Care: No (Comment) Care giver support system in place?: No (comment) Current home services: DME Criminal Activity/Legal Involvement Pertinent to Current Situation/Hospitalization: No - Comment as needed  Activities of Daily Living   ADL Screening (condition at time of admission) Independently performs ADLs?: No Does the patient have a NEW difficulty with bathing/dressing/toileting/self-feeding that is expected to last >3 days?: No Does the patient have a NEW difficulty with getting in/out of bed, walking, or climbing stairs that is expected to last >3 days?: No Does the patient have a NEW difficulty with communication that is expected to last >3 days?: No Is the patient deaf or have difficulty hearing?: No Does the patient have difficulty seeing, even when wearing glasses/contacts?: No Does the patient have difficulty concentrating, remembering, or making decisions?: No  Permission Sought/Granted Permission sought to share information with : Facility Medical Sales Representative, Family Supports Permission granted to share information with : Yes, Verbal Permission Granted  Share Information with NAME: Elwin Tsou  Permission granted to share info w AGENCY: [facility]  Permission granted to share info w Relationship: Spouse  Permission granted to share info w Contact Information: [phone]  Emotional Assessment Appearance:: Appears stated age Attitude/Demeanor/Rapport: Engaged Affect (typically observed): Accepting, Adaptable, Pleasant, Appropriate, Calm, Stable Orientation: : Oriented to Self, Oriented to Place, Oriented to  Time, Oriented to Situation Alcohol / Substance Use: Not Applicable Psych Involvement: No (comment)  Admission diagnosis:  Pneumonia [J18.9] Weakness [  R53.1] Community acquired pneumonia, unspecified laterality [J18.9] Patient Active Problem List   Diagnosis Date Noted   SOB (shortness of breath) 05/15/2024   C/O Cirrhosis. Hx of daily Alcohol consumption 05/15/2024    Mitral regurgitation 03/23/2024   Moderate alcohol consumption 03/23/2024   Lumbago with sciatica, left side 03/22/2024   Lumbago with sciatica, right side 03/22/2024   Sciatica 03/22/2024   Weakness 12/24/2023   Pneumonia 12/24/2023   Chronic pain 12/24/2023   Sepsis (HCC) 09/11/2023   Low back pain 05/12/2023   Difficulty walking 12/11/2022   Foot-drop 12/11/2022   Disorder of spinal region [facility]) 12/11/2022   Muscle atrophy 12/11/2022   Aspiration pneumonia (HCC) 04/12/2022   Pressure injury of skin 04/09/2022   Uremia 04/07/2022   Chronic health problem 04/07/2022   Acute respiratory failure with hypoxia (HCC) 04/07/2022   Septic shock (HCC)    AKI (acute kidney injury)    Hyperkalemia    Acute blood loss anemia    Hematemesis with nausea    Acute encephalopathy 04/05/2022   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) 03/29/2022   OA (osteoarthritis) of knee 03/28/2022   Arthritis of knee 03/28/2022   Abnormal gait due to muscle weakness 03/28/2021   Urinary retention 03/28/2021   Incomplete paraplegia (HCC) 09/08/2020   Spasticity 09/08/2020   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/08/2020   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/2020   Hypoalbuminemia due to protein-calorie malnutrition    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/2020   Constipation    Neurogenic bowel    Neurogenic bladder    Neuropathic pain    Chronic pain syndrome    S/P spinal fusion 07/20/2020   Cord compression (HCC) 07/20/2020   Degenerative lumbar spinal stenosis 11/23/2019   Dupuytren's contracture of right hand 03/17/2019   BMI 35.0-35.9,adult 03/02/2014   Displacement of lumbar intervertebral disc without myelopathy 03/02/2014   Osteoarthritis of right hip 01/07/2014   Right hip pain 01/07/2014   Lumbar spondylosis 11/03/2013   Sacroiliac dysfunction 11/03/2013   Anxiety    Cobalamin deficiency    Hemorrhoids    Hyperlipidemia    Hypertension    Depression    Gouty  arthropathy    Vitamin D deficiency    Multiple fractures of ribs of left side 04/20/2013   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 - [place], [place] - [address] [place] [address] Phone: [phone] Fax: [phone]  [facility] - [place], [place] - [address] [place] [address] Phone: [phone] Fax: [phone]  [facility] [address] [place] [address] Phone: [phone] Fax: [phone]  [id number] [place], [place] - [address] [facility] [address] [place] [address] Phone: [phone] Fax: [phone]     Social Drivers of Health (SDOH) Social History: SDOH Screenings   Food Insecurity: No Food Insecurity (05/15/2024)  Housing: Low Risk  (05/15/2024)  Transportation Needs: No Transportation Needs (05/15/2024)  Utilities: Not At Risk (05/15/2024)  Alcohol Screen: Low Risk  (09/12/2022)  Depression (PHQ2-9): Low Risk  (09/12/2022)  Financial Resource Strain: Low Risk  (09/13/2022)  Physical Activity: Inactive (09/13/2022)  Social Connections: Moderately Integrated (05/15/2024)  Stress: Stress Concern Present (09/13/2022)  Tobacco Use: Medium Risk (05/15/2024)   SDOH Interventions:  Readmission Risk Interventions     No data to display

## 2024-05-17 NOTE — Progress Notes (Signed)
 Modified Barium Swallow Study  Patient Details  Name: Randall Mccormick MRN: [id number] Date of Birth: 11/03/1955  Today's Date: 05/17/2024  Modified Barium Swallow completed.  Full report located under Chart Review in the Imaging Section.  History of Present Illness Pt is a 68 year old male arriving for his second admission for pna this year. Pt has a 2-3 year history of difficulty swallowing, which he describes as solid food sticking (points to neck) eliciting coughing and regurgitation. Pt did have some kind of barium exam approximately in Spring of 2025 and also an endoscopy with dilitation which did not relieve his symptoms. PMH multiple spinal surgeries with neurogenic bowel and bladder with incomplete paraplegia.   Clinical Impression Pt demonstrates a moderate oropharyngeal dysphagia with impairments in efficiency and safety that coincide with pts history. Pt has decreased pharyngoesophageal segment opening resulting in partial passage of boluses and backflow of solids and liquids to the pyriform sinuses. Pt additionally has instances of trace silent aspiration of thin liquids due to slightly late/incomplete airway closure prior to thin bolus passage. Pt thus has accumulation of trace silent penetration, liquids mixed with solid particulate. A head turn left and effortful swallow reduced solid residue and a cued throat clear ejected trace penetrate and aspirate. Pt advised to complete these strategies, decrease oral bacterial load via brushing and flossing before meals and f/u with SLP OP therapy to address compensations and exercise plan that could further decrease risk. Provided in writing and verbally to pt and wife.  DIGEST Swallow Severity Rating*  Safety: 2  Efficiency:1  Overall Pharyngeal Swallow Severity:2 1: mild; 2: moderate; 3: severe; 4: profound  *The Dynamic Imaging Grade of Swallowing Toxicity is standardized for the head and neck cancer population, however, demonstrates  promising clinical applications across populations to standardize the clinical rating of pharyngeal swallow safety and severity.  Factors that may increase risk of adverse event in presence of aspiration Noe & Lianne 2021):    Swallow Evaluation Recommendations Recommendations: PO diet PO Diet Recommendation: Dysphagia 3 (Mechanical soft);Thin liquids (Level 0) Liquid Administration via: Cup;Straw Medication Administration: Whole meds with puree Supervision: Patient able to self-feed Swallowing strategies  : Clear throat intermittently;Follow solids with liquids;Multiple dry swallows after each bite/sip;effortful swallow;Head turn left during swallowing      Lareta Bruneau, Consuelo Fitch 05/17/2024,2:48 PM

## 2024-05-17 NOTE — Plan of Care (Signed)
   Problem: Education: Goal: Knowledge of General Education information will improve Description: Including pain rating scale, medication(s)/side effects and non-pharmacologic comfort measures Outcome: Progressing   Problem: Health Behavior/Discharge Planning: Goal: Ability to manage health-related needs will improve Outcome: Progressing   Problem: Activity: Goal: Risk for activity intolerance will decrease Outcome: Progressing

## 2024-05-17 NOTE — Progress Notes (Signed)
 DISCHARGE NOTE HOME Randall Mccormick to be discharged Home per MD order. Discussed prescriptions and follow up appointments with the patient. Prescriptions given to patient; medication list explained in detail. Patient verbalized understanding.  Skin clean, dry and intact without evidence of skin break down, no evidence of skin tears noted. IV catheter discontinued intact. Site without signs and symptoms of complications. Dressing and pressure applied. Pt denies pain at the site currently. No complaints noted.  See LDA for wound left toe at discharge Patient free of lines, drains, and wounds.   An After Visit Summary (AVS) was printed and given to the patient. Patient escorted via wheelchair, and discharged home via private auto.  Peyton SHAUNNA Pepper, RN

## 2024-05-17 NOTE — Plan of Care (Signed)

## 2024-05-17 NOTE — Progress Notes (Signed)
 SATURATION QUALIFICATIONS: (This note is used to comply with regulatory documentation for home oxygen)  Patient Saturations on Room Air at Rest = 95%  Patient Saturations on Room Air while Ambulating = 93-97%  Patient Saturations on n/a  Liters of oxygen while Ambulating = n/a %  Please briefly explain why patient needs home oxygen: pt does not need home O2 at this time, satting >93% on RA with ambulation and at rest.

## 2024-05-17 NOTE — Discharge Instructions (Addendum)
 Dear Randall Mccormick,  Thank you for letting us  participate in your care. You were hospitalized for worsening SOB and diagnosed with Pneumonia. You were treated with antibiotic.   POST-HOSPITAL & CARE INSTRUCTIONS Continue medications as prescribed Go to your follow up appointments (listed below)  DOCTOR'S APPOINTMENT   Future Appointments  Date Time Provider Department Center  05/25/2024 11:00 AM Lamount Ethan CROME, [place] [facility]  06/22/2024 11:00 AM Madireddy, Alean SAUNDERS, MD [facility] None     Take care and be well!  Family Medicine Teaching Service Inpatient Team Walker  [facility]  [address] [place], [place] [address] [phone]  Community Resource Guide Inpatient Behavioral Health/Residential Substance Abuse Treatment - Adults The United Way's "211" is a great source of information about community services available.  Access by dialing 2-1-1 from anywhere in [place] , or by website -  pooledincome.pl.    (Updated 09/2015)   Crisis Assistance 24 hours a day     Services Offered       Area [employer] 24-hour crisis assistance: [phone] [place], [place]  [facility] 24-hour crisis [phone] [place], [place]  [place] 24-hour crisis assistance: 743-637-7423 [place], [place]  [facility] Access to Care Line 24-hour crisis assistance; [phone] All  [facility] 24-hour crisis response line: [phone] All    Other Local Resources (Updated 06/2015)   Inpatient Behavioral Health/Residential Substance Abuse Treatment Programs     Services          Address and Phone Number  [facility] ([facility])   14-day residential rehabilitation  [phone] 100 [address] [place], [place]  [facility] ([facility])    Detox - private pay only 14-day residential rehabilitation -  Medicaid, insurance, private pay only  [phone], or [phone] [address], [place], [place] [address]   [facility] only Multiple facilities [phone] admissions    [facility] ([facility])   90-day program Must be homeless to participate   [phone], or [phone] Daniel [place]cal[place], [facility]  [facility] only [phone], or  [phone] [address] [place], [place] [address]  [facility] Residential Treatment Services Must make an appointment Accepts private pay, Ursula Mosses [facility] [phone]  [address] [place], [place] [address]   [facility] Females only Associated with the [facility] [place] [phone] [address] [place], [place] [address]  [facility] only [phone], or [phone] [address] [place], [id number]  [facility]     Detox Residential rehabilitation Private insurance only Multiple locations [phone] admissions  [facility]     Private pay Private insurance [phone] [address] [place], [place] [address]  [facility]     Males only Fee required at time of admission [phone] [address] [place], [place] [address]  [facility]     Private pay only   [phone] [address] [place], [place]  RTS (Residential Treatment Services)    Detox - private pay, Medicaid Residential rehabilitation for males  - Medicare, Medicaid, insurance, private pay [phone] [address] [place], [place]   [facility]              Walk-in interviews Monday - Saturday from 8 am - 4 pm Individuals with legal charges are not eligible [phone] [address] [place], [place] [address]  The [facility]  Must be willing to work Must attend Alcoholics Anonymous meetings [phone] [address] [place], [place]   [facility] [employer]    Faith-based program Private pay  only [phone] [address] [place], [place]                                        Outpatient Substance Abuse  Treatment- uninsured  Narcotics Anonymous 24-HOUR HELPLINE Pre-recorded for Meeting Schedules [place] [phone]  [email or web address].[email or web address] ALCOHOLICS ANONYMOUS  [place]   Answering Service [phone] Please Note: All [place] Meetings are Non-smoking findspice.es  Alcohol and Drug Services -  Insurance: Medicaid /State funding/private insurance Methadone, suboxone/Intensive outpatient  [place]   [phone] Fax: [phone] [address] [place], [place], [address] [place] [phone] Fax: [phone]    [address], [place], [place], [address] ([address] [place], [place], [place], [place], [place], [place], [place], [place]) [facility] [email or web address].caringservices.[email or web address]/ Accepts State funding/Medicaid Transitional housing, Intensive Outpatient Treatment, Outpatient treatment, Veterans Services  Phone: [phone] Fax: [phone] Address: [address] [place] [address]   [employer] of Care (http://carterscircleofcare.info/) Insurance: Medicaid Case Management, Administrator, Arts, Medication Management, Outpatient Therapy, Psychosocial Rehabilitation, Substance Abuse Intensive Outpatient  Phone: [phone] Fax: [phone] [address], [place], [place], [address]  [facility] Medicaid, most private insurance providers Types of Program: Individual/Group Therapy, Substance Abuse Treatment  Phone: [place] [phone] Fax: [phone] [address], [place], [place], [address] [place] [phone] [address], [address] [place], [place], [address]  [facility]  Medicaid Types of Program: SAIOP  Phone: [phone] Fax: [phone] [address] [place], [place], [place], [address] [facility] Medicaid/state funds Crisis line [phone] [address]  [facility] [phone] [place] [phone] [place] [phone]  [facility] [address];  [place], [place] [address] [phone]  Substance Abuse Intensive Outpatient Program [facility] [address] [place], [place] [address] [phone]- Substance abuse treatment  Successful Transitions  Insurance: [facility], [address], sliding scale Types of Program: substance abuse treatment, transportation assistance Phone: [phone] Fax: [phone] Address: [address], [place] [address] [facility] (trendswap.ch) Insurance: UHC, [place], [place], [place] Program: addiction counseling, detoxification,  Phone: [phone]  Fax: [phone] Address: [address] [place], [place] [address]  [facility] (statewide facilities/programs) [address] (Medicaid/state funds) [place], [place] [address]                      Http://barrett.[email or web address]/ [phone] Daniel mcalpine- [phone] [place]- [phone] [facility] (2 Locations) (Medicaid/state funds) --[address]  walk in 8:30-12 and 1-2:30 [place], [id number]   [facility]- [phone] --[address] [place], [place] [address]  [phone] walk in 8:30-12 and 2-3:30  [facility] state funds/medicaid [address] [place], [place] [address] [phone] [facility] (Suboxone clinic) Medicaid/state funds  [address]  [place], [place] [address] [phone]   [facility]  [address] [place], [place] [address]  [phone] (24 hours) [place]- [address] [place], [place] [address]  [phone] (24 hours) [place]- [address] [place] [phone] [place]- [address] [place] [address] [place] [phone] [place] [address] Leita Bradley Speculator [phone] [facility]-  Medicaid and state funds  [place]- [address] [place],   [address] [phone] (24 hours) [place]- [address] [place], [place] [address] [phone] [facility]- [address] [place], [place] [address] [phone] (24 hours) [place] [address] [place], [place]  [address] [phone] [place]- [address] [place] [phone]

## 2024-05-17 NOTE — TOC Transition Note (Signed)
 Transition of Care [facility]) - Discharge Note   Patient Details  Name: Randall Mccormick MRN: [id number] Date of Birth: Feb 27, 1956  Transition [facility]) CM/SW Contact:  [name], RN Phone Number: 05/17/2024, 2:55 PM   Clinical Narrative:     Spoke to patient and and patient's wife regarding transition needs.  Wife states patient is active with [facility] and has all needed DME. [name] with [facility] can accept the referral.     Barriers to Discharge: Continued Medical Work up   Patient Goals and CMS Choice Patient states their goals for this hospitalization and ongoing recovery are:: to return [facility] with [facility]          Discharge Placement                       Discharge Plan and Services Additional resources added to the After Visit Summary for   In-house Referral: Clinical Social Work Discharge Planning Services: CM Consult Post Acute Care Choice: Home Health                               Social Drivers of Health (SDOH) Interventions SDOH Screenings   Food Insecurity: No Food Insecurity (05/15/2024)  Housing: Low Risk  (05/15/2024)  Transportation Needs: No Transportation Needs (05/15/2024)  Utilities: Not At Risk (05/15/2024)  Alcohol Screen: Low Risk  (09/12/2022)  Depression (PHQ2-9): Low Risk  (09/12/2022)  Financial Resource Strain: Low Risk  (09/13/2022)  Physical Activity: Inactive (09/13/2022)  Social Connections: Moderately Integrated (05/15/2024)  Stress: Stress Concern Present (09/13/2022)  Tobacco Use: Medium Risk (05/15/2024)     Readmission Risk Interventions     No data to display

## 2024-05-17 NOTE — Progress Notes (Signed)
 PT Cancellation Note  Patient Details Name: Randall Mccormick MRN: [id number] DOB: Apr 15, 1956   Cancelled Treatment:    Reason Eval/Treat Not Completed: Other (comment)  Patient dressed and ready to discharge. Popped in to see if wife or patient had any questions or needs. Wife reports he walked with OT this a.m. and is about back to baseline and feels ready to take pt home. Both denied need of a PT session prior to discharge.    [name] RAMAN, PT Acute Rehabilitation Services  Office [phone]  [name] SHAUNNA Soja 05/17/2024, 2:48 PM

## 2024-05-17 NOTE — Discharge Summary (Addendum)
 Family Medicine Teaching [facility] Discharge Summary  Patient name: Randall Mccormick Medical record number: [id number] Date of birth: 04-26-1956 Age: 68 y.o. Gender: male Date of Admission: 05/15/2024  Date of Discharge: 05/17/24 Admitting Physician: [place] KATHEE Samuels, DO  Primary Care Provider: Keren [name], MD Consultants: Speech Therapy  Indication for Hospitalization: Pneumonia  Discharge Diagnoses/Problem List:  Principal Problem for Admission:  Other Problems addressed during stay:  Principal Problem:   Pneumonia Active Problems:   Constipation   Chronic health problem   SOB (shortness of breath)   C/O Cirrhosis. Hx of daily Alcohol consumption   Brief Hospital Course:  Randall Mccormick is a 68 y.o.male with a history of Anxiety, HLD, HTN, Depression, Gout, Neurogenic B&B, and Incomplete paraplegia who was admitted to the [facility] Teaching Service at [facility] for worsening SOB likely pneumonia . His hospital course is detailed below:  Pneumonia Patient present with worsening SOB. Per patient, he has been feeling weaker in the last few days, unable to get OOB and ambulate w/ rollator w/ low grade fever at home and 102F on arrival. He is now required oxygen to maintain goal oxygen saturation at 92%. Diminish breath sounds especially at lung bases. CXR on admission show patchy left lung base opacity, possibly related to low lung volumes. Bcx is pending but no growth after 2 days at time of discharge. He was placed on Azithromycin  500 mg IV (11/22-11/24) and cefTRIAXone  2 g IV then transition to Oral Augmentin  from 11/22 to 11/26 for 5 days total  Constipation Patient and his wife states last small BM was on 05/14/24, however patient have not been having a regular BM for 2 weeks. We starts patient on bowel regimen and gave Dulcolax suppository once   3. Dysphagia/Risk for aspiration. Barium swallow shows microaspiration, likely with some solid particulates at times. The cause of  dysphagia will not be resolved with therapy or medical intervention - but therapy will help him compensate and reduce aspiration risk. He can continue with thin liquids and soft moist solids. Referral placed for Outpatient Speech Therapy  Other chronic conditions were medically managed with home medications and formulary alternatives as necessary   PCP Follow-up Recommendations: Consider reducing Xanax  F/u w/ Outpatient speech therapy     Results/Tests Pending at Time of Discharge:  Unresulted Labs (From admission, onward)    None        Disposition: Home  Discharge Condition: Stable on RA  Discharge Exam:  Vitals:   05/17/24 0446 05/17/24 0859  BP: (!) 141/85 (!) 142/84  Pulse: 83 84  Resp: 17 19  Temp: 98 F (36.7 C)   SpO2: 92% 95%    Significant Procedures: Barium swallow test   Significant Labs and Imaging:  Recent Labs  Lab 05/16/24 0245  WBC 8.9  HGB 13.7  HCT 41.4  PLT 201   Recent Labs  Lab 05/16/24 0245 05/17/24 0433  NA 142 141  K 3.3* 3.4*  CL 106 104  CO2 26 28  GLUCOSE 109* 84  BUN 9 8  CREATININE 0.77 0.62  CALCIUM  8.5* 8.7*  MG 2.1  --   ALKPHOS 108 111  AST 56* 33  ALT 46* 36  ALBUMIN 2.7* 2.8*     Discharge Medications:  Allergies as of 05/17/2024   No Known Allergies      Medication List     STOP taking these medications    doxycycline  100 MG capsule Commonly known as: MONODOX   TAKE these medications    allopurinol  100 MG tablet Commonly known as: ZYLOPRIM  Take 100 mg by mouth daily.   ALPRAZolam  1 MG tablet Commonly known as: XANAX  Take 1 mg by mouth 3 (three) times daily as needed for anxiety.   amLODipine  2.5 MG tablet Commonly known as: NORVASC  Take 2.5 mg by mouth daily.   amoxicillin -clavulanate 875-125 MG tablet Commonly known as: AUGMENTIN  Take 1 tablet by mouth every 12 (twelve) hours.   aspirin  EC 81 MG tablet Take 81 mg by mouth daily. Swallow whole.   ezetimibe  10 MG  tablet Commonly known as: ZETIA  Take 10 mg by mouth at bedtime.   FLUoxetine  40 MG capsule Commonly known as: PROZAC  Take 40 mg by mouth daily.   fluticasone 50 MCG/ACT nasal spray Commonly known as: FLONASE Place 1 spray into both nostrils 2 (two) times daily as needed for allergies or rhinitis.   folic acid  1 MG tablet Commonly known as: FOLVITE  Take 1 mg by mouth daily.   gabapentin  300 MG capsule Commonly known as: NEURONTIN  Take 300 mg by mouth 2 (two) times daily.   OLANZapine  2.5 MG tablet Commonly known as: ZYPREXA  Take 2.5 mg by mouth at bedtime.   oxyCODONE  15 MG immediate release tablet Commonly known as: ROXICODONE  Take 15 mg by mouth daily.   pantoprazole  40 MG tablet Commonly known as: PROTONIX  Take 40 mg by mouth daily.   polyethylene glycol 17 g packet Commonly known as: MIRALAX  / GLYCOLAX  Take 17 g by mouth 3 (three) times daily. What changed: when to take this   rosuvastatin  5 MG tablet Commonly known as: CRESTOR  Take 1 tablet (5 mg total) by mouth daily.   tamsulosin  0.4 MG Caps capsule Commonly known as: FLOMAX  Take 1 capsule (0.4 mg total) by mouth daily after supper.   albuterol  (2.5 MG/3ML) 0.083% nebulizer solution Commonly known as: PROVENTIL  Take 2.5 mg by nebulization every 6 (six) hours as needed for wheezing or shortness of breath.   Ventolin  HFA 108 (90 Base) MCG/ACT inhaler Generic drug: albuterol  Inhale 1-2 puffs into the lungs every 4 (four) hours as needed for wheezing or shortness of breath.        Discharge Instructions: Please refer to Patient Instructions section of EMR for full details.  Patient was counseled important signs and symptoms that should prompt return to medical care, changes in medications, dietary instructions, activity restrictions, and follow up appointments.   Follow-Up Appointments:   Suzen Houston KATHEE ROSALEA 05/17/2024, 2:27 PM PGY-1, [facility]   I have reviewed the above note,  agree with its content, and have made the appropriate changes.   Damien Pinal, DO [facility], PGY-3 05/17/24 2:31 PM

## 2024-05-17 NOTE — Progress Notes (Signed)
 Occupational Therapy Treatment Patient Details Name: Randall Mccormick MRN: [id number] DOB: [birth year] Today's Date: 05/17/2024   History of present illness Randall Mccormick is a 68 y.o. male who presenting with worsening SOB; noting pneumonia and cirrhosis; w/ PMH of Anxiety, HLD, HTN, Depression, Gout, Neurogenic B&B, and Incomplete paraplegia   OT comments  Pt progressing toward goals this session, overall needs min +2 for short distance ambulation with x2 seated rest breaks. Pt mod A for bed mobility and total A for pericare at bed level. SpO2 93-97% on RA throughout session. Pt presenting with impairments listed below, will follow acutely. Continue to recommend HHOT at d/c.       If plan is discharge home, recommend the following:  A lot of help with walking and/or transfers;A lot of help with bathing/dressing/bathroom;Assistance with cooking/housework;Direct supervision/assist for medications management;Direct supervision/assist for financial management;Assist for transportation;Help with stairs or ramp for entrance   Equipment Recommendations  None recommended by OT    Recommendations for Other Services PT consult    Precautions / Restrictions Precautions Precautions: Fall Recall of Precautions/Restrictions: Intact Restrictions Weight Bearing Restrictions Per Provider Order: No       Mobility Bed Mobility Overal bed mobility: Needs Assistance Bed Mobility: Sit to Supine     Supine to sit: Mod assist     General bed mobility comments: mod A for trunk elevation    Transfers Overall transfer level: Needs assistance Equipment used: Rolling walker (2 wheels) Transfers: Sit to/from Stand, Bed to chair/wheelchair/BSC Sit to Stand: Min assist, +2 physical assistance                 Balance Overall balance assessment: Needs assistance Sitting-balance support: Feet supported, Bilateral upper extremity supported Sitting balance-Leahy Scale: Fair       Standing  balance-Leahy Scale: Poor Standing balance comment: heavy reliance on external support                           ADL either performed or assessed with clinical judgement   ADL Overall ADL's : Needs assistance/impaired                         Toilet Transfer: Minimal assistance;+2 for physical assistance;Ambulation;Rolling walker (2 wheels);Regular Toilet   Toileting- Clothing Manipulation and Hygiene: Total assistance;Bed level       Functional mobility during ADLs: Minimal assistance;+2 for physical assistance;Rolling walker (2 wheels)      Extremity/Trunk Assessment Upper Extremity Assessment Upper Extremity Assessment: Generalized weakness   Lower Extremity Assessment Lower Extremity Assessment: Defer to PT evaluation        Vision   Vision Assessment?: No apparent visual deficits   Perception Perception Perception: Not tested   Praxis Praxis Praxis: Not tested   Communication Communication Communication: No apparent difficulties   Cognition Arousal: Alert Behavior During Therapy: WFL for tasks assessed/performed Cognition: No apparent impairments                               Following commands: Intact        Cueing   Cueing Techniques: Verbal cues  Exercises      Shoulder Instructions       General Comments SpO2 93-97% on RA    Pertinent Vitals/ Pain       Pain Assessment Pain Assessment: Faces Pain Score: 4  Faces Pain Scale: Hurts little more Pain  Location: bil knees Pain Descriptors / Indicators: Discomfort Pain Intervention(s): Limited activity within patient's tolerance, Monitored during session, Repositioned  Home Living                                          Prior Functioning/Environment              Frequency  Min 2X/week        Progress Toward Goals  OT Goals(current goals can now be found in the care plan section)  Progress towards OT goals: Progressing toward  goals  Acute Rehab OT Goals Patient Stated Goal: did not state OT Goal Formulation: With patient Time For Goal Achievement: 05/30/24 Potential to Achieve Goals: Good ADL Goals Pt Will Perform Upper Body Dressing: with supervision;sitting Pt Will Perform Lower Body Dressing: with supervision;sitting/lateral leans;sit to/from stand Pt Will Transfer to Toilet: with contact guard assist;ambulating;regular height toilet Pt/caregiver will Perform Home Exercise Program: Both right and left upper extremity;With Supervision;With written HEP provided Additional ADL Goal #1: pt will perform bed mobiltiy supervision in prep for ADLs  Plan      Co-evaluation                 AM-PAC OT 6 Clicks Daily Activity     Outcome Measure   Help from another person eating meals?: A Little Help from another person taking care of personal grooming?: A Little Help from another person toileting, which includes using toliet, bedpan, or urinal?: A Lot Help from another person bathing (including washing, rinsing, drying)?: A Lot Help from another person to put on and taking off regular upper body clothing?: A Lot Help from another person to put on and taking off regular lower body clothing?: A Lot 6 Click Score: 14    End of Session Equipment Utilized During Treatment: Gait belt;Rolling walker (2 wheels)  OT Visit Diagnosis: Unsteadiness on feet (R26.81);Other abnormalities of gait and mobility (R26.89);Muscle weakness (generalized) (M62.81)   Activity Tolerance Patient tolerated treatment well   Patient Left in chair;with call bell/phone within reach;with nursing/sitter in room   Nurse Communication Mobility status        Time: 9099-9075 OT Time Calculation (min): 24 min  Charges: OT General Charges $OT Visit: 1 Visit OT Treatments $Self Care/Home Management : 8-22 mins $Therapeutic Activity: 8-22 mins  Vittoria Noreen K, OTD, OTR/L SecureChat Preferred Acute Rehab [phone]   Laneta POUR Koonce 05/17/2024, 9:39 AM

## 2024-05-20 LAB — CULTURE, BLOOD (ROUTINE X 2)
Culture: NO GROWTH
Culture: NO GROWTH
Special Requests: ADEQUATE
Special Requests: ADEQUATE

## 2024-05-24 HISTORY — PX: FOOT SURGERY: SHX648

## 2024-05-24 DEATH — deceased

## 2024-05-25 ENCOUNTER — Ambulatory Visit (INDEPENDENT_AMBULATORY_CARE_PROVIDER_SITE_OTHER): Payer: MEDICARE | Admitting: Podiatry

## 2024-05-25 DIAGNOSIS — Z91199 Patient's noncompliance with other medical treatment and regimen due to unspecified reason: Secondary | ICD-10-CM

## 2024-05-27 NOTE — Progress Notes (Signed)
 Patient did not show for scheduled appointment today due to hospitalization

## 2024-05-31 ENCOUNTER — Encounter: Payer: Self-pay | Admitting: Podiatry

## 2024-05-31 ENCOUNTER — Ambulatory Visit: Payer: MEDICARE | Admitting: Podiatry

## 2024-05-31 DIAGNOSIS — M2041 Other hammer toe(s) (acquired), right foot: Secondary | ICD-10-CM

## 2024-05-31 DIAGNOSIS — G5793 Unspecified mononeuropathy of bilateral lower limbs: Secondary | ICD-10-CM | POA: Diagnosis not present

## 2024-05-31 DIAGNOSIS — L97522 Non-pressure chronic ulcer of other part of left foot with fat layer exposed: Secondary | ICD-10-CM

## 2024-05-31 DIAGNOSIS — M2042 Other hammer toe(s) (acquired), left foot: Secondary | ICD-10-CM | POA: Diagnosis not present

## 2024-05-31 NOTE — Progress Notes (Unsigned)
 Chief Complaint  Patient presents with   Foot Ulcer    Left 1st hallux, calloused skin over ulcer area. No pain on palpation. Toe is a little red.     HPI: 68 y.o. male presents today following up for left first toe plantar medial interphalangeal joint ulceration.  Unfortunately he has recently been in the hospital.  Due to this he has been off of his feet and the wound has improved significantly.  It does appear healed today.  Continues to endorse ongoing painful hammertoe contractures mostly of the right foot.  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) 03/29/2022   Aspiration pneumonia (HCC) 04/12/2022   BMI 35.0-35.9,adult 03/02/2014   Chronic pain 12/24/2023   Chronic pain syndrome    Cobalamin deficiency    Constipation    Cord compression (HCC) 07/20/2020   Degeneration of lumbar or lumbosacral intervertebral disc    Degenerative lumbar spinal stenosis 11/23/2019   Depression    Difficulty walking 12/11/2022   Disorder of spinal region (HCC) 12/11/2022   Displacement of lumbar intervertebral disc without myelopathy 03/02/2014   Dupuytren's contracture of right hand 03/17/2019   Foot-drop 12/11/2022   Gouty arthropathy    IMO SNOMED Dx Update Oct 2024     Hematemesis with nausea    Hemorrhoids    Hyperkalemia    Hyperlipidemia    Hypertension    Hypoalbuminemia due to protein-calorie malnutrition    Incomplete paraplegia (HCC) 09/08/2020   Low back pain 05/12/2023   Lumbago with sciatica, left side 03/22/2024   Lumbago with sciatica, right side 03/22/2024   Lumbar spondylosis 11/03/2013   Multiple fractures of  ribs of left side 04/20/2013   FALL FROM A LADDER ON    Muscle atrophy 12/11/2022   Neurogenic bladder    Neurogenic bowel    Neuropathic pain    OA (osteoarthritis) of knee 03/28/2022   Osteoarthritis of right hip 01/07/2014   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/24/2011   Pneumonia 12/24/2023   Pressure injury of skin 04/09/2022   Right hip pain 01/07/2014   S/P spinal fusion 07/20/2020   Sacroiliac dysfunction 11/03/2013   Sciatica 03/22/2024   Sepsis (HCC) 09/11/2023   Septic shock (HCC)    Spasticity 09/08/2020   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/2020   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/2020   Uremia 04/07/2022   Urinary retention 03/28/2021   Vitamin D deficiency    Weakness 12/24/2023   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/08/2020    Past Surgical History:  Procedure Laterality Date   APPENDECTOMY     BACK SURGERY     x 6   CHOLECYSTECTOMY  06/24/2008   [facility]   ESOPHAGOGASTRODUODENOSCOPY N/A 04/06/2022   Procedure: ESOPHAGOGASTRODUODENOSCOPY (EGD);  Surgeon: Legrand Victory LITTIE DOUGLAS, MD;  Location: [facility] ENDOSCOPY;  Service: Gastroenterology;  Laterality: N/A;   TONSILLECTOMY     TOTAL KNEE ARTHROPLASTY Right 03/28/2022   Procedure: RIGHT TOTAL KNEE ARTHROPLASTY;  Surgeon: Addie Cordella Hamilton, MD;  Location: [facility] OR;  Service: Orthopedics;  Laterality: Right;    No Known Allergies  ROS    Physical Exam: There were no vitals filed for this visit.  General: The patient is alert and oriented x3 in no acute distress.  Dermatology: Callus present overlying the left plantar medial interphalangeal joint of first toe.  Underlying ulceration appears well-healed.  Vascular: Palpable pedal pulses bilaterally. Capillary refill within normal limits.  Baseline edema present to bilateral lower extremities  Neurological: Light touch sensation decreased to toes.  Subjective paresthesias noted bilaterally.  Musculoskeletal Exam:  Digital deformities noted bilaterally.  Spastic hammer toes noted to lesser digits bilaterally, digits right foot painful involving second, third, fourth, fifth toes.  There is valgus drift of first toe left greater than right with hallux malleus contracture and a similar reducible.    Assessment/Plan of Care: 1. Hammer toes of both feet   2. Ulcer of great toe, left, with fat layer exposed (HCC)   3. Neuropathy involving both lower extremities      No orders of the defined types were placed in this encounter.  None  Discussed clinical findings with patient today.  Ulceration left first toe appears well-healed today.  Advised that they continue to monitor site closely for signs of recurrence and continue to offload with felt padding  Prior to developing ulceration had discussed flexor tenotomy's of the right second, third, fourth, fifth hammertoes due to spastic nature of the toes causing pain.  He is interested in proceeding with this going forward, likely after the holidays.  Follow-up as needed, approximately 4 weeks if planning to proceed with procedure.   Caliegh Middlekauff L. Lamount MAUL, AACFAS [facility]     [address] [place], [place] [address]                Office [phone]  Fax [phone]

## 2024-05-31 NOTE — Addendum Note (Signed)
 Addended by: LAMOUNT ETHAN CROME on: 05/31/2024 02:05 PM   Modules accepted: Level of Service

## 2024-06-10 ENCOUNTER — Ambulatory Visit (HOSPITAL_COMMUNITY): Payer: MEDICARE | Admitting: Speech Pathology

## 2024-06-15 DIAGNOSIS — M51379 Other intervertebral disc degeneration, lumbosacral region without mention of lumbar back pain or lower extremity pain: Secondary | ICD-10-CM | POA: Insufficient documentation

## 2024-06-21 ENCOUNTER — Ambulatory Visit (INDEPENDENT_AMBULATORY_CARE_PROVIDER_SITE_OTHER): Payer: MEDICARE | Admitting: Podiatry

## 2024-06-21 ENCOUNTER — Encounter: Payer: Self-pay | Admitting: Podiatry

## 2024-06-21 DIAGNOSIS — G5793 Unspecified mononeuropathy of bilateral lower limbs: Secondary | ICD-10-CM

## 2024-06-21 DIAGNOSIS — M2042 Other hammer toe(s) (acquired), left foot: Secondary | ICD-10-CM

## 2024-06-21 DIAGNOSIS — M2041 Other hammer toe(s) (acquired), right foot: Secondary | ICD-10-CM

## 2024-06-21 MED ORDER — CEPHALEXIN 500 MG PO CAPS: 500.0000 mg | ORAL_CAPSULE | Freq: Three times a day (TID) | ORAL | 0 refills | Status: AC

## 2024-06-21 NOTE — Progress Notes (Unsigned)
 Has home oxy Flexor tenotomy of 2,3,4,5 right foot

## 2024-06-22 ENCOUNTER — Ambulatory Visit: Payer: MEDICARE

## 2024-06-22 VITALS — BP 116/52 | HR 78 | Ht 68.0 in

## 2024-06-22 DIAGNOSIS — I10 Essential (primary) hypertension: Secondary | ICD-10-CM | POA: Diagnosis not present

## 2024-06-22 DIAGNOSIS — E782 Mixed hyperlipidemia: Secondary | ICD-10-CM | POA: Diagnosis not present

## 2024-06-22 DIAGNOSIS — I340 Nonrheumatic mitral (valve) insufficiency: Secondary | ICD-10-CM | POA: Insufficient documentation

## 2024-06-22 NOTE — Assessment & Plan Note (Signed)
 Reviewed echocardiogram results and images from 04/14/2024. Normal biventricular function. MR appears trace to mild.  Does not appear consistent with moderate to severe MR as noted on prior echocardiogram report from 02/18/2024 at [facility] pulmonary and sleep clinic.  Reassured them about the findings. Will review repeat echocardiogram again in about 18-24 months for interval follow-up.  Trace dependent bilateral lower extremity edema. Advised to keep salt intake down to less than 2 g/day. If lower extremity edema increases may consider low-dose loop diuretics. Advised to keep feet elevated while at rest and consider compression socks once his right foot wound heals.

## 2024-06-22 NOTE — Progress Notes (Signed)
 "  Cardiology Consultation:    Date:  06/22/2024   ID:  Randall Mccormick, DOB 1955/10/10, MRN [id number]  PCP:  Keren [name], MD  Cardiologist:  Alean SAUNDERS Lenord Fralix, MD   Referring MD: Keren [name], MD   No chief complaint on file.    ASSESSMENT AND PLAN:   Mr. Randall Mccormick 68 year old male with history of  coronary atherosclerosis [on CT chest April 2025 at [facility], hypertension, hyperlipidemia, chronic back pain [multiple surgeries, most recent 2 years ago], bilateral lower extremity weakness leading to functional limitations, asthma/COPD, OSA, moderate alcohol consumption [now reduced to 2 drinks a day], dysphagia.  repeat echocardiogram 04/14/2024 noted technically difficult study with LVEF 60 to 65%, normal diastolic function, normal RV size and function, trace to mild MR.  Overall stable from cardiac standpoint and here for a follow-up visit.  Problem List Items Addressed This Visit       Cardiovascular and Mediastinum   Hypertension - Primary   Well-controlled. Continue amlodipine  2.5 mg once daily.       Mitral regurgitation   Reviewed echocardiogram results and images from 04/14/2024. Normal biventricular function. MR appears trace to mild.  Does not appear consistent with moderate to severe MR as noted on prior echocardiogram report from 02/18/2024 at [facility] pulmonary and sleep clinic.  Reassured them about the findings. Will review repeat echocardiogram again in about 18-24 months for interval follow-up.  Trace dependent bilateral lower extremity edema. Advised to keep salt intake down to less than 2 g/day. If lower extremity edema increases may consider low-dose loop diuretics. Advised to keep feet elevated while at rest and consider compression socks once his right foot wound heals.          Other   Hyperlipidemia   Lipid panel 05/31/2024 with total cholesterol 118, triglycerides 49, HDL 64 and LDL 42.  Well-controlled. Stable  transaminases and alkaline phosphatase. Continue rosuvastatin  5 mg once daily. Continue Zetia  10 mg once daily       Return to clinic tentatively in 10 to 12 months.   History of Present Illness:    Randall Mccormick is a 68 y.o. male who is being seen today for follow-up visit. PCP is Keren [name], MD. Last visit with me in the office was 03/23/2024.  Has history of coronary atherosclerosis [on CT chest April 2025 at [facility], hypertension, hyperlipidemia, chronic back pain [multiple surgeries, most recent 2 years ago], bilateral lower extremity weakness leading to functional limitations, asthma/COPD, OSA, moderate alcohol consumption [up to 4 drinks a day], dysphagia   Echocardiogram report dated 11/18/2023 at [facility] pulmonary and sleep clinic reported left ventricle mildly dilated EF 60%, left atrium moderately dilated, moderate to severe MR, moderate tricuspid regurgitation. Images not available to review myself. Prior echocardiogram report from October 2023 at [facility] reported LVEF 70 to 75%, hyperdynamic, limited study technically difficult, no significant valve dysfunction reported.    Followed up with repeat echocardiogram 04/14/2024 noted technically difficult study with LVEF 60 to 65%, normal diastolic function, normal RV size and function, trace to mild MR.  Blood work sent from PCPs office dated 05/31/2024 reviewed. Lipid panel with total cholesterol 118, triglycerides 49, HDL 64 and LDL 42.  Well-controlled. BUN 19, creatinine 0.75, eGFR 98. Sodium 139 and potassium 4. Normal transaminases and alkaline phosphatase. PSA levels normal 1.6. Hemoglobin 15.8, hematocrit 49.6, WBC 14, platelets 267.  Elevated leukocytes along with elevated neutrophils 12.3.  Had an inpatient stay between 05/15/2024 through 05/17/2024 in  the setting of pneumonia felt secondary to aspiration.  Received inpatient care with antibiotics IV transition to oral Augmentin  prior to  discharge.  Noted to be high risk for silent aspiration associated with dysphagia to solids.  Has been set up for speech and swallow therapy follow-up at home and outpatient GI follow-up. Also reports having toe surgery done on the right foot for hammertoe yesterday.  Here for the visit today accompanied by his wife. Functional status and mobility significantly limited due to back pain. At home uses rollator to ambulate to the restroom and around the kitchen. Has home health nurse visiting and speech and swallow specialist visiting.  Overall mentions he has been doing well from cardiac standpoint no symptoms of chest pain, palpitations, shortness of breath. Denies any syncopal or near syncopal episodes. Denies any blood in urine or stools.  Mentions blood pressures have been well-controlled. Good compliance with his medications.  He has cut down alcohol consumption to 1-2 drinks a day.  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 Hx of daily Alcohol consumption 05/15/2024   Chronic pain 12/24/2023   Chronic pain syndrome    Cobalamin deficiency    Constipation    Cord compression (HCC) 07/20/2020   Degeneration of lumbar or lumbosacral intervertebral disc    Degenerative lumbar spinal stenosis 11/23/2019   Depression    Difficulty walking 12/11/2022   Disorder of spinal region [facility]) 12/11/2022   Displacement of lumbar intervertebral disc without myelopathy 03/02/2014   Dupuytren's contracture of right hand 03/17/2019    Esophageal dysphagia 05/17/2024   Foot-drop 12/11/2022   Gouty arthropathy    IMO SNOMED Dx Update Oct 2024     Hematemesis with nausea    Hemorrhoids    Hyperkalemia    Hyperlipidemia    Hypertension    Hypoalbuminemia due to protein-calorie malnutrition    Incomplete paraplegia (HCC) 09/08/2020   Low back pain 05/12/2023   Lumbago with sciatica, left side 03/22/2024   Lumbago with sciatica, right side 03/22/2024   Lumbar spondylosis 11/03/2013   Mitral regurgitation 03/23/2024   Moderate alcohol consumption 03/23/2024   Multiple fractures of ribs of left side 04/20/2013   FALL FROM A LADDER ON    Muscle atrophy 12/11/2022   Neurogenic bladder    Neurogenic bowel    Neuropathic pain    OA (osteoarthritis) of knee 03/28/2022   Osteoarthritis of right hip 01/07/2014   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/24/2011   Pneumonia 12/24/2023   Pressure injury of skin 04/09/2022   Right hip pain 01/07/2014   S/P spinal fusion 07/20/2020   Sacroiliac dysfunction 11/03/2013   Sciatica 03/22/2024   Sepsis (HCC) 09/11/2023   Septic shock (HCC)    SOB (shortness of breath) 05/15/2024   Spasticity 09/08/2020   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/2020   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/2020   Uremia 04/07/2022   Urinary retention 03/28/2021   Vitamin D deficiency    Weakness 12/24/2023   Wheelchair dependence  09/08/2020    Past Surgical History:  Procedure Laterality Date   APPENDECTOMY     BACK SURGERY     x 6   CHOLECYSTECTOMY  06/24/2008   [facility]   ESOPHAGOGASTRODUODENOSCOPY N/A 04/06/2022   Procedure: ESOPHAGOGASTRODUODENOSCOPY (EGD);  Surgeon: Legrand Victory LITTIE DOUGLAS, MD;  Location: [facility] ENDOSCOPY;  Service: Gastroenterology;  Laterality: N/A;   FOOT SURGERY Right 05/2024   TONSILLECTOMY     TOTAL KNEE ARTHROPLASTY Right 03/28/2022   Procedure: RIGHT TOTAL KNEE ARTHROPLASTY;  Surgeon: Addie Cordella Hamilton, MD;   Location: [facility] OR;  Service: Orthopedics;  Laterality: Right;    Current Medications: Active Medications[1]   Allergies:   Patient has no known allergies.   Social History   Socioeconomic History   Marital status: Married    Spouse name: [name]   Number of children: 1   Years of education: Not on file   Highest education level: Not on file  Occupational History   Not on file  Tobacco Use   Smoking status: Never   Smokeless tobacco: Former    Types: [place]    Quit date: 2022  Vaping Use   Vaping status: Never Used  Substance and Sexual Activity   Alcohol use: Yes    Alcohol/week: 1.0 standard drink of alcohol    Types: 1 Shots of liquor per week    Comment: 2 per night   Drug use: Not Currently   Sexual activity: Not Currently  Other Topics Concern   Not on file  Social History Narrative   Right Handed   1 Cup of Coffee   Social Drivers of Health   Tobacco Use: Medium Risk (06/22/2024)   Patient History    Smoking Tobacco Use: Never    Smokeless Tobacco Use: Former    Passive Exposure: Not on Actuary Strain: Low Risk (09/13/2022)   Overall Financial Resource Strain (CARDIA)    Difficulty of Paying Living Expenses: Not hard at all  Food Insecurity: No Food Insecurity (05/15/2024)   Epic    Worried About Radiation Protection Practitioner of Food in the Last Year: Never true    Ran Out of Food in the Last Year: Never true  Transportation Needs: No Transportation Needs (05/15/2024)   Epic    Lack of Transportation (Medical): No    Lack of Transportation (Non-Medical): No  Physical Activity: Inactive (09/13/2022)   Exercise Vital Sign    Days of Exercise per Week: 0 days    Minutes of Exercise per Session: 0 min  Stress: Stress Concern Present (09/13/2022)   [employer] of Occupational Health - Occupational Stress Questionnaire    Feeling of Stress : Rather much  Social Connections: Moderately Integrated (05/15/2024)   Social Connection and Isolation Panel     Frequency of Communication with Friends and Family: Twice a week    Frequency of Social Gatherings with Friends and Family: Once a week    Attends Religious Services: 1 to 4 times per year    Active Member of Clubs or Organizations: No    Attends Banker Meetings: Never    Marital Status: Married  Depression (PHQ2-9): Low Risk (09/12/2022)   Depression (PHQ2-9)    PHQ-2 Score: 0  Alcohol Screen: Low Risk (09/12/2022)   Alcohol Screen    Last Alcohol Screening Score (AUDIT): 4  Housing: Low Risk (05/15/2024)   Epic    Unable to Pay for Housing in the Last Year: No    Number of Times Moved  in the Last Year: 0    Homeless in the Last Year: No  Utilities: Not At Risk (05/15/2024)   Epic    Threatened with loss of utilities: No  Health Literacy: Not on file     Family History: The patient's family history includes Alcoholism in his father; Cancer in his mother and son; Heart disease in his father. ROS:   Please see the history of present illness.    All 14 point review of systems negative except as described per history of present illness.  EKGs/Labs/Other Studies Reviewed:    The following studies were reviewed today:   EKG:       Recent Labs: 12/24/2023: TSH 0.894 05/15/2024: B Natriuretic Peptide 28.3 05/16/2024: Hemoglobin 13.7; Magnesium  2.1; Platelets 201 05/17/2024: ALT 36; BUN 8; Creatinine, Ser 0.62; Potassium 3.4; Sodium 141  Recent Lipid Panel    Component Value Date/Time   TRIG 217 (H) 04/07/2022 0516    Physical Exam:    VS:  BP (!) 116/52   Pulse 78   Ht 5' 8 (1.727 m)   SpO2 92%   BMI 33.45 kg/m     Wt Readings from Last 3 Encounters:  05/15/24 220 lb 0.3 oz (99.8 kg)  03/23/24 219 lb 3.2 oz (99.4 kg)  03/15/24 215 lb (97.5 kg)     GENERAL:  Well nourished, well developed in no acute distress NECK: No JVD; No carotid bruits CARDIAC: RRR, S1 and S2 present, no murmurs, no rubs, no gallops CHEST:  Clear to auscultation without rales,  wheezing or rhonchi  Extremities: Trace bilateral ankle edema.  Radial pulses 2+ bilaterally symmetric and dorsalis pedis 1+ bilaterally symmetric.  Left foot in a nonweightbearing boot given to surgery yesterday. NEUROLOGIC:  Alert and oriented x 3  Medication Adjustments/Labs and Tests Ordered: Current medicines are reviewed at length with the patient today.  Concerns regarding medicines are outlined above.  No orders of the defined types were placed in this encounter.  No orders of the defined types were placed in this encounter.   Signed, Guido Comp reddy Milos Milligan, MD, MPH, [facility]. 06/22/2024 12:03 PM    [facility]    [1]  Current Meds  Medication Sig   albuterol  (PROVENTIL ) (2.5 MG/3ML) 0.083% nebulizer solution Take 2.5 mg by nebulization every 6 (six) hours as needed for wheezing or shortness of breath.   allopurinol  (ZYLOPRIM ) 300 MG tablet Take 300 mg by mouth daily.   ALPRAZolam  (XANAX ) 1 MG tablet Take 1 mg by mouth 3 (three) times daily as needed for anxiety.   amLODipine  (NORVASC ) 2.5 MG tablet Take 2.5 mg by mouth daily.   aspirin  EC 81 MG tablet Take 81 mg by mouth daily. Swallow whole.   baclofen  (LIORESAL ) 10 MG tablet Take 10 mg by mouth daily.   cephALEXin  (KEFLEX ) 500 MG capsule Take 1 capsule (500 mg total) by mouth 3 (three) times daily for 3 days.   ezetimibe  (ZETIA ) 10 MG tablet Take 10 mg by mouth at bedtime.   FLUoxetine  (PROZAC ) 40 MG capsule Take 40 mg by mouth daily.   fluticasone (FLONASE) 50 MCG/ACT nasal spray Place 1 spray into both nostrils 2 (two) times daily as needed for allergies or rhinitis.   folic acid  (FOLVITE ) 1 MG tablet Take 1 mg by mouth daily.   gabapentin  (NEURONTIN ) 300 MG capsule Take 300 mg by mouth 2 (two) times daily.   OLANZapine  (ZYPREXA ) 2.5 MG tablet Take 2.5 mg by mouth at bedtime.  oxyCODONE  (ROXICODONE ) 15 MG immediate release tablet Take 15 mg by mouth daily.   pantoprazole  (PROTONIX ) 40 MG tablet  Take 40 mg by mouth daily.   polyethylene glycol (MIRALAX  / GLYCOLAX ) 17 g packet Take 17 g by mouth 3 (three) times daily.   rosuvastatin  (CRESTOR ) 5 MG tablet Take 1 tablet (5 mg total) by mouth daily.   tamsulosin  (FLOMAX ) 0.4 MG CAPS capsule Take 1 capsule (0.4 mg total) by mouth daily after supper.   VENTOLIN  HFA 108 (90 Base) MCG/ACT inhaler Inhale 1-2 puffs into the lungs every 4 (four) hours as needed for wheezing or shortness of breath.   [DISCONTINUED] allopurinol  (ZYLOPRIM ) 100 MG tablet Take 100 mg by mouth daily.   "

## 2024-06-22 NOTE — Assessment & Plan Note (Signed)
 Lipid panel 05/31/2024 with total cholesterol 118, triglycerides 49, HDL 64 and LDL 42.  Well-controlled. Stable transaminases and alkaline phosphatase. Continue rosuvastatin  5 mg once daily. Continue Zetia  10 mg once daily

## 2024-06-22 NOTE — Assessment & Plan Note (Signed)
Well-controlled.  Continue amlodipine 2.5 mg once daily. 

## 2024-06-22 NOTE — Patient Instructions (Signed)
 Medication Instructions:  Your physician recommends that you continue on your current medications as directed. Please refer to the Current Medication list given to you today.  *If you need a refill on your cardiac medications before your next appointment, please call your pharmacy*   Lab Work: None ordered If you have labs (blood work) drawn today and your tests are completely normal, you will receive your results only by: MyChart Message (if you have MyChart) OR A paper copy in the mail If you have any lab test that is abnormal or we need to change your treatment, we will call you to review the results.   Testing/Procedures: None ordered   Follow-Up: At [facility], you and your health needs are our priority.  As part of our continuing mission to provide you with exceptional heart care, we have created designated Provider Care Teams.  These Care Teams include your primary Cardiologist (physician) and Advanced Practice Providers (APPs -  Physician Assistants and Nurse Practitioners) who all work together to provide you with the care you need, when you need it.  We recommend signing up for the patient portal called MyChart.  Sign up information is provided on this After Visit Summary.  MyChart is used to connect with patients for Virtual Visits (Telemedicine).  Patients are able to view lab/test results, encounter notes, upcoming appointments, etc.  Non-urgent messages can be sent to your provider as well.   To learn more about what you can do with MyChart, go to [email or web address].    Your next appointment:   10 month(s)  The format for your next appointment:   In Person  Provider:   Alean Kobus, MD    Other Instructions none  Important Information About Sugar

## 2024-06-29 ENCOUNTER — Encounter: Payer: Self-pay | Admitting: Podiatry

## 2024-06-29 ENCOUNTER — Ambulatory Visit (INDEPENDENT_AMBULATORY_CARE_PROVIDER_SITE_OTHER): Payer: MEDICARE | Admitting: Podiatry

## 2024-06-29 DIAGNOSIS — M2041 Other hammer toe(s) (acquired), right foot: Secondary | ICD-10-CM

## 2024-06-29 DIAGNOSIS — M2042 Other hammer toe(s) (acquired), left foot: Secondary | ICD-10-CM

## 2024-06-29 NOTE — Progress Notes (Signed)
 "  Subjective:  Patient ID: Randall Mccormick, male    DOB: October 14, 1955,  MRN: [id number]  Chief Complaint  Patient presents with   Routine Post Op    Right tenotomy on toes 2-4. Doing well, looks pretty good.     DOS: 06/21/2024 Procedure: Right foot flexor tenotomy procedure of second, third, fourth, fifth toes  70 y.o. male returns for post-op check.  Reports doing pretty well.  Pain well-controlled at this point.  Denies signs of infection.  Has been ambulating in surgical shoe.  Review of Systems: Negative except as noted in the HPI. Denies N/V/F/Ch.  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 Hx of daily Alcohol consumption 05/15/2024   Chronic pain 12/24/2023   Chronic pain syndrome    Cobalamin deficiency    Constipation    Cord compression (HCC) 07/20/2020   Degeneration of lumbar or lumbosacral intervertebral disc    Degenerative lumbar spinal stenosis 11/23/2019   Depression    Difficulty walking 12/11/2022   Disorder of spinal region [facility]) 12/11/2022   Displacement of lumbar intervertebral disc without myelopathy 03/02/2014   Dupuytren's contracture of right hand 03/17/2019   Esophageal dysphagia 05/17/2024   Foot-drop 12/11/2022   Gouty arthropathy    IMO SNOMED Dx Update Oct 2024     Hematemesis with nausea    Hemorrhoids    Hyperkalemia    Hyperlipidemia    Hypertension    Hypoalbuminemia due to protein-calorie malnutrition    Incomplete paraplegia (HCC) 09/08/2020   Low back pain 05/12/2023    Lumbago with sciatica, left side 03/22/2024   Lumbago with sciatica, right side 03/22/2024   Lumbar spondylosis 11/03/2013   Mitral regurgitation 03/23/2024   Moderate alcohol consumption 03/23/2024   Multiple fractures of ribs of left side 04/20/2013   FALL FROM A LADDER ON    Muscle atrophy 12/11/2022   Neurogenic bladder    Neurogenic bowel    Neuropathic pain    OA (osteoarthritis) of knee 03/28/2022   Osteoarthritis of right hip 01/07/2014   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/24/2011   Pneumonia 12/24/2023   Pressure injury of skin 04/09/2022   Right hip pain 01/07/2014   S/P spinal fusion 07/20/2020   Sacroiliac dysfunction 11/03/2013   Sciatica 03/22/2024   Sepsis (HCC) 09/11/2023   Septic shock (HCC)    SOB (shortness of breath) 05/15/2024   Spasticity 09/08/2020   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/2020   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/2020   Uremia 04/07/2022   Urinary retention 03/28/2021   Vitamin D deficiency    Weakness 12/24/2023   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/08/2020   Current Medications[1]  Tobacco Use History[2]  Allergies[3] Objective:  There were no vitals filed for this visit. There is no height or weight on file to calculate BMI. Constitutional Well developed. Well nourished.  Vascular Foot warm and well perfused. Capillary refill normal to all digits.  Generalized edema present.  No erythema.  Neurologic Normal speech. Oriented to person, place,  and time. Decreased light touch sensation to digits noted, at baseline.  Dermatologic Flexor tenotomy sites right foot second, third, fourth, fifth toes appear to be healing well without signs of infection.  There is large scab 3rd and 4th toes that was left in place.  Orthopedic: Minimal tenderness to palpation noted about the surgical site.  Improved rectus position of second, third, fourth, fifth toes right foot with some fibular deviation noted  of all toes.  Hallux malleus noted.    Assessment:   1. Hammer toes of both feet    Plan:  Patient was evaluated and treated and all questions answered.  S/p right foot percutaneous flexor tenotomy of second, third, fourth, fifth toes right foot -Progressing as expected post-operatively. -WB Status: Weightbearing as tolerated in regular shoe gear - Incision sites healing well.  Do recommend maintaining Band-Aid to 3rd and 4th toe due to presence of scab -Medications: Does have home pain medication available.  Not requiring further prescription medication. - Will recheck in 3 weeks.  Did discuss monitoring of first toe hallux malleus as patient does have chronic bilateral forefoot and digital deformities due to spastic contractures.  Return in about 3 weeks (around 07/20/2024) for Post Op Check.       [1]  Current Outpatient Medications:    albuterol  (PROVENTIL ) (2.5 MG/3ML) 0.083% nebulizer solution, Take 2.5 mg by nebulization every 6 (six) hours as needed for wheezing or shortness of breath., Disp: , Rfl:    allopurinol  (ZYLOPRIM ) 300 MG tablet, Take 300 mg by mouth daily., Disp: , Rfl:    ALPRAZolam  (XANAX ) 1 MG tablet, Take 1 mg by mouth 3 (three) times daily as needed for anxiety., Disp: , Rfl:    amLODipine  (NORVASC ) 2.5 MG tablet, Take 2.5 mg by mouth daily., Disp: , Rfl:    aspirin  EC 81 MG tablet, Take 81 mg by mouth daily. Swallow whole., Disp: , Rfl:    baclofen  (LIORESAL ) 10 MG tablet, Take 10 mg by mouth daily., Disp: , Rfl:    ezetimibe  (ZETIA ) 10 MG tablet, Take 10 mg by mouth at bedtime., Disp: , Rfl:    FLUoxetine  (PROZAC ) 40 MG capsule, Take 40 mg by mouth daily., Disp: , Rfl:    fluticasone (FLONASE) 50 MCG/ACT nasal spray, Place 1 spray into both nostrils 2 (two) times daily as needed for allergies or rhinitis., Disp: , Rfl:    folic acid  (FOLVITE ) 1 MG tablet, Take 1 mg by mouth daily., Disp: , Rfl:    gabapentin  (NEURONTIN ) 300 MG capsule, Take 300 mg by mouth 2  (two) times daily., Disp: , Rfl:    OLANZapine  (ZYPREXA ) 2.5 MG tablet, Take 2.5 mg by mouth at bedtime., Disp: , Rfl:    oxyCODONE  (ROXICODONE ) 15 MG immediate release tablet, Take 15 mg by mouth daily., Disp: , Rfl:    pantoprazole  (PROTONIX ) 40 MG tablet, Take 40 mg by mouth daily., Disp: , Rfl:    polyethylene glycol (MIRALAX  / GLYCOLAX ) 17 g packet, Take 17 g by mouth 3 (three) times daily., Disp: , Rfl:    rosuvastatin  (CRESTOR ) 5 MG tablet, Take 1 tablet (5 mg total) by mouth daily., Disp: 90 tablet, Rfl: 3   tamsulosin  (FLOMAX ) 0.4 MG CAPS capsule, Take 1 capsule (0.4 mg total) by mouth daily after supper., Disp: , Rfl:    VENTOLIN  HFA 108 (90 Base) MCG/ACT inhaler, Inhale 1-2 puffs into the lungs every 4 (four) hours as needed for wheezing or shortness of breath., Disp: , Rfl:  [  2]  Social History Tobacco Use  Smoking Status Never  Smokeless Tobacco Former   Types: Chew   Quit date: 2022  [3] No Known Allergies  "

## 2024-07-09 ENCOUNTER — Emergency Department (HOSPITAL_COMMUNITY): Payer: MEDICARE

## 2024-07-09 ENCOUNTER — Inpatient Hospital Stay (HOSPITAL_COMMUNITY)
Admission: EM | Admit: 2024-07-09 | Discharge: 2024-08-22 | DRG: 870 | Disposition: E | Payer: MEDICARE | Attending: Internal Medicine | Admitting: Internal Medicine

## 2024-07-09 DIAGNOSIS — Z79899 Other long term (current) drug therapy: Secondary | ICD-10-CM

## 2024-07-09 DIAGNOSIS — Z96651 Presence of right artificial knee joint: Secondary | ICD-10-CM | POA: Diagnosis present

## 2024-07-09 DIAGNOSIS — Z87440 Personal history of urinary (tract) infections: Secondary | ICD-10-CM

## 2024-07-09 DIAGNOSIS — Z993 Dependence on wheelchair: Secondary | ICD-10-CM

## 2024-07-09 DIAGNOSIS — K219 Gastro-esophageal reflux disease without esophagitis: Secondary | ICD-10-CM | POA: Diagnosis present

## 2024-07-09 DIAGNOSIS — R1312 Dysphagia, oropharyngeal phase: Secondary | ICD-10-CM | POA: Diagnosis present

## 2024-07-09 DIAGNOSIS — E785 Hyperlipidemia, unspecified: Secondary | ICD-10-CM | POA: Diagnosis present

## 2024-07-09 DIAGNOSIS — E872 Acidosis, unspecified: Secondary | ICD-10-CM | POA: Diagnosis present

## 2024-07-09 DIAGNOSIS — F419 Anxiety disorder, unspecified: Secondary | ICD-10-CM | POA: Diagnosis present

## 2024-07-09 DIAGNOSIS — Z1152 Encounter for screening for COVID-19: Secondary | ICD-10-CM

## 2024-07-09 DIAGNOSIS — G894 Chronic pain syndrome: Secondary | ICD-10-CM | POA: Diagnosis present

## 2024-07-09 DIAGNOSIS — Z87891 Personal history of nicotine dependence: Secondary | ICD-10-CM

## 2024-07-09 DIAGNOSIS — A414 Sepsis due to anaerobes: Principal | ICD-10-CM | POA: Diagnosis present

## 2024-07-09 DIAGNOSIS — A419 Sepsis, unspecified organism: Principal | ICD-10-CM | POA: Diagnosis present

## 2024-07-09 DIAGNOSIS — Z7982 Long term (current) use of aspirin: Secondary | ICD-10-CM

## 2024-07-09 DIAGNOSIS — R6520 Severe sepsis without septic shock: Secondary | ICD-10-CM | POA: Diagnosis present

## 2024-07-09 DIAGNOSIS — R578 Other shock: Secondary | ICD-10-CM | POA: Diagnosis not present

## 2024-07-09 DIAGNOSIS — K529 Noninfective gastroenteritis and colitis, unspecified: Secondary | ICD-10-CM | POA: Diagnosis present

## 2024-07-09 DIAGNOSIS — J9811 Atelectasis: Secondary | ICD-10-CM | POA: Diagnosis present

## 2024-07-09 DIAGNOSIS — N319 Neuromuscular dysfunction of bladder, unspecified: Secondary | ICD-10-CM | POA: Diagnosis present

## 2024-07-09 DIAGNOSIS — R54 Age-related physical debility: Secondary | ICD-10-CM | POA: Diagnosis present

## 2024-07-09 DIAGNOSIS — Z9049 Acquired absence of other specified parts of digestive tract: Secondary | ICD-10-CM

## 2024-07-09 DIAGNOSIS — Z66 Do not resuscitate: Secondary | ICD-10-CM | POA: Diagnosis not present

## 2024-07-09 DIAGNOSIS — K7200 Acute and subacute hepatic failure without coma: Secondary | ICD-10-CM | POA: Diagnosis present

## 2024-07-09 DIAGNOSIS — Z8249 Family history of ischemic heart disease and other diseases of the circulatory system: Secondary | ICD-10-CM

## 2024-07-09 DIAGNOSIS — G9341 Metabolic encephalopathy: Secondary | ICD-10-CM

## 2024-07-09 DIAGNOSIS — G8222 Paraplegia, incomplete: Secondary | ICD-10-CM | POA: Diagnosis present

## 2024-07-09 DIAGNOSIS — G7281 Critical illness myopathy: Secondary | ICD-10-CM | POA: Diagnosis present

## 2024-07-09 DIAGNOSIS — N179 Acute kidney failure, unspecified: Secondary | ICD-10-CM | POA: Diagnosis not present

## 2024-07-09 DIAGNOSIS — I10 Essential (primary) hypertension: Secondary | ICD-10-CM | POA: Diagnosis present

## 2024-07-09 DIAGNOSIS — M1A9XX0 Chronic gout, unspecified, without tophus (tophi): Secondary | ICD-10-CM | POA: Diagnosis present

## 2024-07-09 DIAGNOSIS — F418 Other specified anxiety disorders: Secondary | ICD-10-CM | POA: Diagnosis present

## 2024-07-09 DIAGNOSIS — J690 Pneumonitis due to inhalation of food and vomit: Secondary | ICD-10-CM | POA: Diagnosis not present

## 2024-07-09 DIAGNOSIS — F32A Depression, unspecified: Secondary | ICD-10-CM | POA: Diagnosis present

## 2024-07-09 DIAGNOSIS — Z515 Encounter for palliative care: Secondary | ICD-10-CM

## 2024-07-09 DIAGNOSIS — Z6835 Body mass index (BMI) 35.0-35.9, adult: Secondary | ICD-10-CM

## 2024-07-09 DIAGNOSIS — J9601 Acute respiratory failure with hypoxia: Secondary | ICD-10-CM | POA: Diagnosis present

## 2024-07-09 DIAGNOSIS — E870 Hyperosmolality and hypernatremia: Secondary | ICD-10-CM | POA: Diagnosis present

## 2024-07-09 DIAGNOSIS — K592 Neurogenic bowel, not elsewhere classified: Secondary | ICD-10-CM | POA: Diagnosis present

## 2024-07-09 DIAGNOSIS — Z981 Arthrodesis status: Secondary | ICD-10-CM

## 2024-07-09 DIAGNOSIS — E66812 Obesity, class 2: Secondary | ICD-10-CM | POA: Diagnosis present

## 2024-07-09 DIAGNOSIS — A09 Infectious gastroenteritis and colitis, unspecified: Secondary | ICD-10-CM

## 2024-07-09 DIAGNOSIS — A0472 Enterocolitis due to Clostridium difficile, not specified as recurrent: Secondary | ICD-10-CM | POA: Diagnosis present

## 2024-07-09 DIAGNOSIS — M109 Gout, unspecified: Secondary | ICD-10-CM | POA: Diagnosis present

## 2024-07-09 LAB — URINALYSIS, W/ REFLEX TO CULTURE (INFECTION SUSPECTED)
Bilirubin Urine: NEGATIVE
Glucose, UA: NEGATIVE mg/dL
Ketones, ur: NEGATIVE mg/dL
Leukocytes,Ua: NEGATIVE
Nitrite: NEGATIVE
Protein, ur: 30 mg/dL — AB
Specific Gravity, Urine: 1.018 (ref 1.005–1.030)
pH: 5 (ref 5.0–8.0)

## 2024-07-09 LAB — CBC WITH DIFFERENTIAL/PLATELET
Abs Immature Granulocytes: 0.15 K/uL — ABNORMAL HIGH (ref 0.00–0.07)
Basophils Absolute: 0 K/uL (ref 0.0–0.1)
Basophils Relative: 0 %
Eosinophils Absolute: 0.1 K/uL (ref 0.0–0.5)
Eosinophils Relative: 0 %
HCT: 51.1 % (ref 39.0–52.0)
Hemoglobin: 16.4 g/dL (ref 13.0–17.0)
Immature Granulocytes: 1 %
Lymphocytes Relative: 6 %
Lymphs Abs: 1.2 K/uL (ref 0.7–4.0)
MCH: 32.2 pg (ref 26.0–34.0)
MCHC: 32.1 g/dL (ref 30.0–36.0)
MCV: 100.4 fL — ABNORMAL HIGH (ref 80.0–100.0)
Monocytes Absolute: 2.2 K/uL — ABNORMAL HIGH (ref 0.1–1.0)
Monocytes Relative: 10 %
Neutro Abs: 18 K/uL — ABNORMAL HIGH (ref 1.7–7.7)
Neutrophils Relative %: 83 %
Platelets: 227 K/uL (ref 150–400)
RBC: 5.09 MIL/uL (ref 4.22–5.81)
RDW: 16.5 % — ABNORMAL HIGH (ref 11.5–15.5)
WBC: 21.7 K/uL — ABNORMAL HIGH (ref 4.0–10.5)
nRBC: 0 % (ref 0.0–0.2)

## 2024-07-09 LAB — I-STAT CG4 LACTIC ACID, ED
Lactic Acid, Venous: 2.3 mmol/L (ref 0.5–1.9)
Lactic Acid, Venous: 1 mmol/L (ref 0.5–1.9)

## 2024-07-09 LAB — I-STAT CHEM 8, ED
BUN: 15 mg/dL (ref 8–23)
Calcium, Ion: 1.06 mmol/L — ABNORMAL LOW (ref 1.15–1.40)
Chloride: 99 mmol/L (ref 98–111)
Creatinine, Ser: 1 mg/dL (ref 0.61–1.24)
Glucose, Bld: 116 mg/dL — ABNORMAL HIGH (ref 70–99)
HCT: 54 % — ABNORMAL HIGH (ref 39.0–52.0)
Hemoglobin: 18.4 g/dL — ABNORMAL HIGH (ref 13.0–17.0)
Potassium: 3.8 mmol/L (ref 3.5–5.1)
Sodium: 139 mmol/L (ref 135–145)
TCO2: 26 mmol/L (ref 22–32)

## 2024-07-09 LAB — COMPREHENSIVE METABOLIC PANEL WITH GFR
ALT: 36 U/L (ref 0–44)
AST: 48 U/L — ABNORMAL HIGH (ref 15–41)
Albumin: 3.5 g/dL (ref 3.5–5.0)
Alkaline Phosphatase: 241 U/L — ABNORMAL HIGH (ref 38–126)
Anion gap: 12 (ref 5–15)
BUN: 14 mg/dL (ref 8–23)
CO2: 26 mmol/L (ref 22–32)
Calcium: 8.5 mg/dL — ABNORMAL LOW (ref 8.9–10.3)
Chloride: 99 mmol/L (ref 98–111)
Creatinine, Ser: 1.07 mg/dL (ref 0.61–1.24)
GFR, Estimated: >60 mL/min
Glucose, Bld: 113 mg/dL — ABNORMAL HIGH (ref 70–99)
Potassium: 4 mmol/L (ref 3.5–5.1)
Sodium: 137 mmol/L (ref 135–145)
Total Bilirubin: 2.6 mg/dL — ABNORMAL HIGH (ref 0.0–1.2)
Total Protein: 7 g/dL (ref 6.5–8.1)

## 2024-07-09 LAB — RESP PANEL BY RT-PCR (RSV, FLU A&B, COVID)  RVPGX2
Influenza A by PCR: NEGATIVE
Influenza B by PCR: NEGATIVE
Resp Syncytial Virus by PCR: NEGATIVE
SARS Coronavirus 2 by RT PCR: NEGATIVE

## 2024-07-09 MED ORDER — FLUOXETINE HCL 20 MG PO CAPS
40.0000 mg | ORAL_CAPSULE | Freq: Every day | ORAL | Status: DC
  Administered 2024-07-10 – 2024-07-12 (×3): 40 mg via ORAL
  Filled 2024-07-09 (×5): qty 2

## 2024-07-09 MED ORDER — LACTATED RINGERS IV BOLUS
1000.0000 mL | Freq: Once | INTRAVENOUS | Status: AC
  Administered 2024-07-09: 1000 mL via INTRAVENOUS

## 2024-07-09 MED ORDER — GABAPENTIN 300 MG PO CAPS
300.0000 mg | ORAL_CAPSULE | Freq: Two times a day (BID) | ORAL | Status: DC
  Administered 2024-07-10 – 2024-07-12 (×6): 300 mg via ORAL
  Filled 2024-07-09 (×7): qty 1

## 2024-07-09 MED ORDER — IOHEXOL 350 MG/ML SOLN
100.0000 mL | Freq: Once | INTRAVENOUS | Status: AC | PRN
  Administered 2024-07-09: 100 mL via INTRAVENOUS

## 2024-07-09 MED ORDER — LACTATED RINGERS IV SOLN
150.0000 mL/h | INTRAVENOUS | Status: AC
  Administered 2024-07-10 (×2): 150 mL/h via INTRAVENOUS

## 2024-07-09 MED ORDER — ALPRAZOLAM 0.5 MG PO TABS
1.0000 mg | ORAL_TABLET | Freq: Three times a day (TID) | ORAL | Status: DC | PRN
  Administered 2024-07-10 – 2024-07-13 (×4): 1 mg via ORAL
  Filled 2024-07-09 (×2): qty 2
  Filled 2024-07-09: qty 4
  Filled 2024-07-09 (×2): qty 2

## 2024-07-09 MED ORDER — ACETAMINOPHEN 325 MG PO TABS
650.0000 mg | ORAL_TABLET | Freq: Four times a day (QID) | ORAL | Status: DC | PRN
  Administered 2024-07-10 – 2024-07-23 (×11): 650 mg via ORAL
  Filled 2024-07-09 (×11): qty 2

## 2024-07-09 MED ORDER — ONDANSETRON HCL 4 MG/2ML IJ SOLN: 4.0000 mg | Freq: Four times a day (QID) | INTRAMUSCULAR | Status: DC | PRN

## 2024-07-09 MED ORDER — OLANZAPINE 2.5 MG PO TABS
2.5000 mg | ORAL_TABLET | Freq: Every day | ORAL | Status: DC
  Administered 2024-07-10 – 2024-07-12 (×3): 2.5 mg via ORAL
  Filled 2024-07-09 (×4): qty 1

## 2024-07-09 MED ORDER — METRONIDAZOLE 500 MG/100ML IV SOLN
500.0000 mg | Freq: Once | INTRAVENOUS | Status: AC
  Administered 2024-07-09: 500 mg via INTRAVENOUS
  Filled 2024-07-09: qty 100

## 2024-07-09 MED ORDER — ACETAMINOPHEN 650 MG RE SUPP: 650.0000 mg | Freq: Four times a day (QID) | RECTAL | Status: DC | PRN

## 2024-07-09 MED ORDER — ONDANSETRON HCL 4 MG PO TABS: 4.0000 mg | ORAL_TABLET | Freq: Four times a day (QID) | ORAL | Status: DC | PRN

## 2024-07-09 MED ORDER — EZETIMIBE 10 MG PO TABS
10.0000 mg | ORAL_TABLET | Freq: Every day | ORAL | Status: DC
  Administered 2024-07-10 – 2024-07-12 (×3): 10 mg via ORAL
  Filled 2024-07-09 (×4): qty 1

## 2024-07-09 MED ORDER — ACETAMINOPHEN 650 MG RE SUPP
650.0000 mg | Freq: Once | RECTAL | Status: AC
  Administered 2024-07-09: 650 mg via RECTAL
  Filled 2024-07-09: qty 1

## 2024-07-09 MED ORDER — OXYCODONE HCL 5 MG PO TABS
15.0000 mg | ORAL_TABLET | Freq: Three times a day (TID) | ORAL | Status: DC | PRN
  Administered 2024-07-15: 15 mg via ORAL
  Filled 2024-07-09: qty 3

## 2024-07-09 MED ORDER — ALLOPURINOL 300 MG PO TABS
300.0000 mg | ORAL_TABLET | Freq: Every day | ORAL | Status: DC
  Administered 2024-07-10 – 2024-07-12 (×3): 300 mg via ORAL
  Filled 2024-07-09 (×4): qty 1
  Filled 2024-07-09: qty 3

## 2024-07-09 MED ORDER — BACLOFEN 10 MG PO TABS
5.0000 mg | ORAL_TABLET | Freq: Every day | ORAL | Status: DC
  Administered 2024-07-10 – 2024-07-12 (×3): 5 mg via ORAL
  Filled 2024-07-09 (×2): qty 0.5
  Filled 2024-07-09: qty 1

## 2024-07-09 MED ORDER — ALBUTEROL SULFATE (2.5 MG/3ML) 0.083% IN NEBU
2.5000 mg | INHALATION_SOLUTION | Freq: Four times a day (QID) | RESPIRATORY_TRACT | Status: DC | PRN
  Administered 2024-07-13 – 2024-07-24 (×3): 2.5 mg via RESPIRATORY_TRACT
  Filled 2024-07-09 (×3): qty 3

## 2024-07-09 MED ORDER — VANCOMYCIN HCL IN DEXTROSE 1-5 GM/200ML-% IV SOLN: 1000.0000 mg | Freq: Once | INTRAVENOUS | Status: DC

## 2024-07-09 MED ORDER — ENOXAPARIN SODIUM 40 MG/0.4ML IJ SOSY
40.0000 mg | PREFILLED_SYRINGE | INTRAMUSCULAR | Status: DC
  Administered 2024-07-09 – 2024-07-15 (×7): 40 mg via SUBCUTANEOUS
  Filled 2024-07-09 (×7): qty 0.4

## 2024-07-09 MED ORDER — SODIUM CHLORIDE 0.9 % IV SOLN
2.0000 g | Freq: Once | INTRAVENOUS | Status: AC
  Administered 2024-07-09: 2 g via INTRAVENOUS
  Filled 2024-07-09: qty 12.5

## 2024-07-09 MED ORDER — VANCOMYCIN HCL 2000 MG/400ML IV SOLN
2000.0000 mg | Freq: Once | INTRAVENOUS | Status: AC
  Administered 2024-07-09: 2000 mg via INTRAVENOUS
  Filled 2024-07-09: qty 400

## 2024-07-09 MED ORDER — SODIUM CHLORIDE 0.9% FLUSH
3.0000 mL | Freq: Two times a day (BID) | INTRAVENOUS | Status: DC
  Administered 2024-07-09 – 2024-07-24 (×29): 3 mL via INTRAVENOUS

## 2024-07-09 MED ORDER — PIPERACILLIN-TAZOBACTAM 3.375 G IVPB
3.3750 g | Freq: Three times a day (TID) | INTRAVENOUS | Status: DC
  Administered 2024-07-10 (×2): 3.375 g via INTRAVENOUS
  Filled 2024-07-09 (×2): qty 50

## 2024-07-09 MED ORDER — ROSUVASTATIN CALCIUM 5 MG PO TABS
5.0000 mg | ORAL_TABLET | Freq: Every day | ORAL | Status: DC
  Administered 2024-07-10 – 2024-07-12 (×3): 5 mg via ORAL
  Filled 2024-07-09 (×3): qty 1

## 2024-07-09 NOTE — ED Provider Notes (Signed)
 " [place] [facility] Provider Note   CSN: [id number] Arrival date & time: 07/09/24  1758     Patient presents with: Altered Mental Status   Randall Mccormick is a 69 y.o. male.  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  Started having nonbloody diarrhea several days ago.  Also is having some abdominal pain.  Does have a chronic cough but no changes recently.  Went to [facility] emergency department yesterday and had lab work done with a leukocytosis but otherwise reassuring.  COVID and flu was negative.  He was discharged home.  Reports symptoms worsened since then       Prior to Admission medications  Medication Sig Start Date End Date Taking? Authorizing Provider  albuterol  (PROVENTIL ) (2.5 MG/3ML) 0.083% nebulizer solution Take 2.5 mg by nebulization every 6 (six) hours as needed for wheezing or shortness of breath.    [provider]  allopurinol  (ZYLOPRIM ) 300 MG tablet Take 300 mg by mouth daily. 05/12/24   [provider]  ALPRAZolam  (XANAX ) 1 MG tablet Take 1 mg by mouth 3 (three) times daily as needed for anxiety.    [provider]  amLODipine  (NORVASC ) 2.5 MG tablet Take 2.5 mg by mouth daily.    [provider]  aspirin  EC 81 MG tablet Take 81 mg by mouth daily. Swallow whole.    [provider]  baclofen  (LIORESAL ) 10 MG tablet Take 10 mg by mouth daily. 06/12/24   [provider]  ezetimibe  (ZETIA ) 10 MG tablet Take 10 mg by mouth at bedtime. 04/29/20   [provider]  FLUoxetine  (PROZAC ) 40 MG capsule Take 40 mg by mouth daily. 03/22/24   [provider]  fluticasone (FLONASE) 50 MCG/ACT nasal spray Place 1 spray into both nostrils 2 (two) times daily as needed for allergies  or rhinitis.    [provider]  folic acid  (FOLVITE ) 1 MG tablet Take 1 mg by mouth daily. 07/29/23   [provider]  gabapentin  (NEURONTIN ) 300 MG capsule Take 300 mg by mouth 2 (two) times daily. 04/13/24   [provider]  OLANZapine  (ZYPREXA ) 2.5 MG tablet Take 2.5 mg by mouth at bedtime.    [provider]  oxyCODONE  (ROXICODONE ) 15 MG immediate release tablet Take 15 mg by mouth daily. 04/07/24   [provider]  pantoprazole  (PROTONIX ) 40 MG tablet Take 40 mg by mouth daily. 08/25/23   [provider]  polyethylene glycol (MIRALAX  / GLYCOLAX ) 17 g packet Take 17 g by mouth 3 (three) times daily. 12/27/23   Ghimire, Donalda HERO, MD  rosuvastatin  (CRESTOR ) 5 MG tablet Take 1 tablet (5 mg total) by mouth daily. 03/23/24 06/29/24  Madireddy, Alean SAUNDERS, MD  tamsulosin  (FLOMAX ) 0.4 MG CAPS capsule Take 1 capsule (0.4 mg total) by mouth daily after supper. 12/27/23   Ghimire, Donalda HERO, MD  VENTOLIN  HFA 108 (90 Base) MCG/ACT inhaler Inhale 1-2 puffs into the lungs every 4 (four) hours as needed for wheezing or shortness of breath. 08/22/23   [provider]    Allergies: Patient has no known allergies.    Review of Systems  Updated Vital Signs BP (!) 147/73 (BP Location: Left Arm)   Pulse (!) 125   Temp (!) 102 F (38.9 C) (Oral)   Resp (!) 26   SpO2 98%  Physical Exam Vitals and nursing note reviewed.  Constitutional:      General: He is not in acute distress.    Appearance: He is well-developed.  HENT:     Head: Normocephalic and atraumatic.     Right Ear: External ear normal.     Left Ear: External ear normal.     Nose: Nose normal.  Eyes:     Extraocular Movements: Extraocular movements intact.     Conjunctiva/sclera: Conjunctivae normal.     Pupils: Pupils are equal, round, and reactive to light.  Cardiovascular:     Rate and Rhythm: Regular rhythm. Tachycardia present.     Heart sounds: Normal heart sounds.   Pulmonary:     Effort: Pulmonary effort is normal. No respiratory distress.     Breath sounds: Normal breath sounds.  Abdominal:     General: There is no distension.     Palpations: Abdomen is soft. There is no mass.     Tenderness: There is no abdominal tenderness. There is no guarding.  Musculoskeletal:     Cervical back: Normal range of motion and neck supple.  Skin:    General: Skin is warm and dry.     Capillary Refill: Capillary refill takes more than 3 seconds.  Neurological:     Mental Status: He is alert. Mental status is at baseline.  Psychiatric:        Mood and Affect: Mood normal.        Behavior: Behavior normal.     (all labs ordered are listed, but only abnormal results are displayed) Labs Reviewed  I-STAT CG4 LACTIC ACID, ED - Abnormal; Notable for the following components:      Result Value   Lactic Acid, Venous 2.3 (*)    All other components within normal limits  I-STAT CHEM 8, ED - Abnormal; Notable for the following components:   Glucose, Bld 116 (*)    Calcium , Ion 1.06 (*)    Hemoglobin 18.4 (*)    HCT 54.0 (*)    All other components within normal limits  RESP PANEL BY RT-PCR (RSV, FLU A&B, COVID)  RVPGX2  CULTURE, BLOOD (ROUTINE X 2)  CULTURE, BLOOD (ROUTINE X 2)  COMPREHENSIVE METABOLIC PANEL WITH GFR  CBC WITH DIFFERENTIAL/PLATELET  URINALYSIS, W/ REFLEX TO CULTURE (INFECTION SUSPECTED)  CBC WITH DIFFERENTIAL/PLATELET    EKG: None  Radiology: DG Chest Port 1 View Result Date: 07/09/2024 CLINICAL DATA:  Possible sepsis EXAM: PORTABLE CHEST 1 VIEW COMPARISON:  05/15/2024 FINDINGS: Hypoventilatory changes. Suspected elevation of the right diaphragm. No pleural effusion. Difficult to exclude airspace disease at left base. Cardiomegaly with aortic atherosclerosis IMPRESSION: 1. Hypoventilatory changes with suspected elevation of the right diaphragm. Difficult to exclude airspace disease at the left base. 2. Cardiomegaly. Electronically Signed    By: Luke Bun M.D.   On: 07/09/2024 18:44    {Document cardiac monitor, telemetry assessment procedure when appropriate:32947} Procedures   Medications Ordered in the ED  ceFEPIme  (MAXIPIME ) 2 g in sodium chloride  0.9 % 100 mL IVPB (2 g Intravenous New Bag/Given 07/09/24 1845)  metroNIDAZOLE  (FLAGYL ) IVPB 500 mg (has no administration in time range)  vancomycin  (VANCOREADY) IVPB 2000 mg/400 mL (has no administration in time range)    Clinical Course as of 07/09/24 2058  Fri Jul 09, 2024  1913 Fever yesterday. Had some diarrhea. Had leukocytosis. Covid and flu negative. No imaging. Pt wanted to go home.  [RP]    Clinical Course User Index [RP] Ladonte, [place],  MD   {Click here for ABCD2, HEART and other calculators REFRESH Note before signing:1}                              Medical Decision Making Amount and/or Complexity of Data Reviewed Labs: ordered. Radiology: ordered.  Risk OTC drugs. Prescription drug management. Decision regarding hospitalization.   ***  {Document critical care time when appropriate  Document review of labs and clinical decision tools ie CHADS2VASC2, etc  Document your independent review of radiology images and any outside records  Document your discussion with family members, caretakers and with consultants  Document social determinants of health affecting pt's care  Document your decision making why or why not admission, treatments were needed:32947:::1}   Final diagnoses:  None    ED Discharge Orders     None        "

## 2024-07-09 NOTE — ED Notes (Signed)
 Assisted PT with repositioning.SABRA

## 2024-07-09 NOTE — H&P (Signed)
 " History and Physical    Randall Mccormick FMW:[id number] DOB: 07/11/1955 DOA: 07/09/2024  PCP: Keren [name], MD  Patient coming from: Home  I have personally briefly reviewed patient's old medical records in [facility]  Chief Complaint: Diarrhea  HPI: Randall Mccormick is a 69 y.o. 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  Patient reports frequent nonbloody diarrhea beginning several days ago.  He has had fevers, chills, diaphoresis.  Reportedly he was seen in the [facility] ED yesterday.  He had lab work performed which showed leukocytosis but otherwise reassuring.  COVID and flu was negative.  He was discharged to home.  Today his spouse noted that he had worsening symptoms with continued fever and some confusion.  EMS were called.  Per ED triage documentation temperature was 104 F.  He was brought to the ED for further evaluation.  Patient otherwise denies chest pain, dyspnea, abdominal pain, dysuria.  Patient has been on multiple antibiotic courses over the last several months including Augmentin , Keflex , clindamycin, doxycycline , and most recently Levaquin prescribed on 06/30/2024 for 7-day course.  ED Course  Labs/Imaging on admission: I have personally reviewed following labs and imaging studies.  Initial vitals showed BP 127/76, pulse 123, RR 28, temp 102.0 F, SpO2 97% on room air.  Labs showed WBC 21.7, hemoglobin 16.4, platelets 227, sodium 137, potassium 4.0, bicarb 26, BUN 14, creatinine 1.07, serum glucose 113, AST 48, ALT 36, alk phos 241, total bilirubin 2.6, lactic acid 2.3.  Blood cultures in process.  SARS-CoV-2, influenza, RSV PCR negative.  UA showed negative nitrates, negative leukocytes, 0-5 RBCs and WBCs, rare bacteria.  C. difficile and GI pathogen  panels ordered and pending collection.  Portable chest x-ray showed cardiomegaly, hypoventilatory changes with suspected elevation of the right diaphragm.  CT abdomen/pelvis with contrast showed fluid throughout the colon consistent with a diarrheal state/enteritis.  No other acute abnormality.  Mild atelectatic changes noted in the lung bases bilaterally.  Patient was given 2 L LR, IV vancomycin , cefepime , Flagyl .  The hospitalist service was consulted for admission.  Review of Systems: All systems reviewed and are negative except as documented in history of present illness above.   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 Hx of daily Alcohol consumption 05/15/2024   Chronic pain 12/24/2023   Chronic pain syndrome    Cobalamin deficiency    Constipation    Cord compression (HCC) 07/20/2020   Degeneration of lumbar or lumbosacral intervertebral disc    Degenerative lumbar spinal stenosis 11/23/2019   Depression    Difficulty walking 12/11/2022   Disorder of spinal region [facility]) 12/11/2022   Displacement of lumbar intervertebral disc without myelopathy 03/02/2014   Dupuytren's contracture of right hand 03/17/2019   Esophageal dysphagia 05/17/2024   Foot-drop 12/11/2022   Gouty arthropathy    IMO SNOMED Dx Update Oct 2024  Hematemesis with nausea    Hemorrhoids    Hyperkalemia    Hyperlipidemia    Hypertension    Hypoalbuminemia due to protein-calorie malnutrition    Incomplete paraplegia (HCC) 09/08/2020   Low  back pain 05/12/2023   Lumbago with sciatica, left side 03/22/2024   Lumbago with sciatica, right side 03/22/2024   Lumbar spondylosis 11/03/2013   Mitral regurgitation 03/23/2024   Moderate alcohol consumption 03/23/2024   Multiple fractures of ribs of left side 04/20/2013   FALL FROM A LADDER ON    Muscle atrophy 12/11/2022   Neurogenic bladder    Neurogenic bowel    Neuropathic pain    OA (osteoarthritis) of knee 03/28/2022   Osteoarthritis of right hip 01/07/2014   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/24/2011   Pneumonia 12/24/2023   Pressure injury of skin 04/09/2022   Right hip pain 01/07/2014   S/P spinal fusion 07/20/2020   Sacroiliac dysfunction 11/03/2013   Sciatica 03/22/2024   Sepsis (HCC) 09/11/2023   Septic shock (HCC)    SOB (shortness of breath) 05/15/2024   Spasticity 09/08/2020   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/2020   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/2020   Uremia 04/07/2022   Urinary retention 03/28/2021   Vitamin D deficiency    Weakness 12/24/2023   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/08/2020    Past Surgical History:  Procedure Laterality Date   APPENDECTOMY     BACK SURGERY     x 6   CHOLECYSTECTOMY  06/24/2008   [facility]   ESOPHAGOGASTRODUODENOSCOPY N/A 04/06/2022   Procedure: ESOPHAGOGASTRODUODENOSCOPY (EGD);  Surgeon: Legrand Victory LITTIE DOUGLAS, MD;  Location[facility] ENDOSCOPY;  Service: Gastroenterology;  Laterality: N/A;   FOOT SURGERY Right 05/2024   TONSILLECTOMY     TOTAL KNEE ARTHROPLASTY Right 03/28/2022   Procedure: RIGHT TOTAL KNEE ARTHROPLASTY;  Surgeon: Addie Cordella Hamilton, MD;  Location: [facility] OR;  Service: Orthopedics;  Laterality: Right;    Social History: Social History[1]  Allergies[2]  Family History  Problem Relation Age of Onset   Cancer Mother        BREAST   Heart disease Father    Alcoholism Father    Cancer Son        LEUKEMIA     Prior to Admission medications   Medication Sig Start Date End Date Taking? Authorizing Provider  albuterol  (PROVENTIL ) (2.5 MG/3ML) 0.083% nebulizer solution Take 2.5 mg by nebulization every 6 (six) hours as needed for wheezing or shortness of breath.   Yes [provider]  allopurinol  (ZYLOPRIM ) 300 MG tablet Take 300 mg by mouth daily. 05/12/24  Yes [provider]  ALPRAZolam  (XANAX ) 1 MG tablet Take 1 mg by mouth 3 (three) times daily as needed for anxiety.   Yes [provider]  amLODipine  (NORVASC ) 2.5 MG tablet Take 2.5 mg by mouth daily.   Yes [provider]  aspirin  EC 81 MG tablet Take 81 mg by mouth daily. Swallow whole.   Yes [provider]  baclofen  (LIORESAL ) 10 MG tablet Take 5 mg by mouth daily. 06/12/24  Yes [provider]  ezetimibe  (ZETIA ) 10 MG tablet Take 10 mg by mouth at bedtime. 04/29/20  Yes [provider]  FLUoxetine  (PROZAC ) 40 MG capsule Take 40 mg by mouth daily. 03/22/24  Yes [provider]  fluticasone (FLONASE) 50 MCG/ACT nasal spray Place 1 spray into both nostrils 2 (two) times daily as needed for allergies or rhinitis.   Yes [provider]  folic acid  (FOLVITE ) 1 MG tablet Take 1 mg by mouth daily. 07/29/23  Yes [provider]  gabapentin  (NEURONTIN ) 300 MG capsule Take 300 mg by mouth 2 (two) times daily. 04/13/24  Yes [provider]  OLANZapine  (ZYPREXA ) 2.5 MG tablet Take 2.5 mg by mouth at bedtime.   Yes [provider]  oxyCODONE  (ROXICODONE ) 15 MG immediate release tablet Take 15 mg by mouth every 8 (eight) hours as needed for pain. 04/07/24  Yes [provider]  pantoprazole  (PROTONIX ) 40 MG tablet Take 40 mg by mouth daily. 08/25/23  Yes [provider]  polyethylene glycol (MIRALAX  / GLYCOLAX ) 17 g packet Take 17 g by mouth 3 (three) times daily. Patient taking differently: Take 17 g by mouth daily. May take 1 additional tablet as needed for constipation  12/27/23  Yes Ghimire, Donalda HERO, MD  rosuvastatin  (CRESTOR ) 5 MG tablet Take 1 tablet (5 mg total) by mouth daily. 03/23/24 07/09/25 Yes Madireddy, Alean SAUNDERS, MD  VENTOLIN  HFA 108 (90 Base) MCG/ACT inhaler Inhale 1-2 puffs into the lungs every 4 (four) hours as needed for wheezing or shortness of breath. 08/22/23  Yes [provider]  tamsulosin  (FLOMAX ) 0.4 MG CAPS capsule Take 1 capsule (0.4 mg total) by mouth daily after supper. Patient not taking: Reported on 07/09/2024 12/27/23   Raenelle Donalda HERO, MD    Physical Exam: Vitals:   07/09/24 2100 07/09/24 2115 07/09/24 2130 07/09/24 2145  BP: (!) 87/65 (!) 97/51 107/68 104/61  Pulse: (!) 115 (!) 113 (!) 110 (!) 110  Resp: (!) 22 20 (!) 21 (!) 21  Temp:      TempSrc:      SpO2: 98% 98% 97% 96%   Constitutional: Resting supine in bed, appears fatigued Eyes: EOMI, lids and conjunctivae normal ENMT: Mucous membranes are dry. Posterior pharynx clear of any exudate or lesions.Normal dentition.  Neck: normal, supple, no masses. Respiratory: clear to auscultation bilaterally, no wheezing, no crackles. Normal respiratory effort. No accessory muscle use.  Cardiovascular: Tachycardic, no murmurs / rubs / gallops. No extremity edema. 2+ pedal pulses. Abdomen: Distended but soft abdomen without guarding.  No tenderness. Musculoskeletal: no clubbing / cyanosis. Good ROM, no contractures. Normal muscle tone.  Skin: Diaphoretic.  Callus medial left first toe. Neurologic:  Strength 5/5 bilateral upper extremities, 4/5 bilateral lower extremities. Psychiatric: Alert and oriented to self and year.  Initially states he is in [facility] but knows that he is in [place].  EKG: Personally reviewed. Sinus tachycardia, rate 123.  Rate is faster when compared to previous.  Assessment/Plan Principal Problem:   Severe sepsis (HCC) Active Problems:   Acute metabolic encephalopathy   Enteritis   Hyperlipidemia   Hypertension   Depression with  anxiety   Gouty arthropathy   Chronic pain syndrome   Incomplete paraplegia (HCC)   Hyperbilirubinemia   Randall Mccormick is a 69 y.o. 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  Assessment and Plan: Severe sepsis due to enteritis, POA: Patient presenting with fever, leukocytosis, lactic acidosis, tachycardia.  CT suggestive of diarrheal illness/enteritis.  High risk of C. difficile given multiple courses of antibiotics, most recently Levaquin in the last week. - Obtain C. difficile and GI pathogen panels - Follow blood cultures - Continue IV fluid hydration overnight - Start on empiric Zosyn   Acute metabolic encephalopathy: Secondary to sepsis.  Continue management as  above.  Hyperbilirubinemia: Likely secondary to sepsis physiology.  Abdomen nontender.  Check hepatic function panel in AM.  Hypertension: BP has been borderline low.  Hold amlodipine .  Hyperlipidemia: Continue rosuvastatin  and Zetia .  Incomplete paraplegia with neurogenic bladder: Patient with history of thoracic spinal fusion and chronic incomplete paraplegia with neurogenic bladder not requiring chronic Foley. - Continue fall precautions, bladder scan and In-N-Out cath as needed - Continue baclofen , gabapentin   Depression/anxiety: Continue home Prozac , Zyprexa , and Xanax  1 mg 3 times daily as needed with hold parameters.  Chronic pain: Continue home Oxy IR 15 mg every 8 hours as needed with hold parameters.  Gout: Continue allopurinol .   DVT prophylaxis: enoxaparin  (LOVENOX ) injection 40 mg Start: 07/09/24 2200 Code Status: Full code Family Communication: Discussed with patient, he has discussed with family Disposition Plan: From home, dispo pending clinical progress Consults called: None Severity of Illness: The  appropriate patient status for this patient is INPATIENT. Inpatient status is judged to be reasonable and necessary in order to provide the required intensity of service to ensure the patient's safety. The patient's presenting symptoms, physical exam findings, and initial radiographic and laboratory data in the context of their chronic comorbidities is felt to place them at high risk for further clinical deterioration. Furthermore, it is not anticipated that the patient will be medically stable for discharge from the hospital within 2 midnights of admission.   * I certify that at the point of admission it is my clinical judgment that the patient will require inpatient hospital care spanning beyond 2 midnights from the point of admission due to high intensity of service, high risk for further deterioration and high frequency of surveillance required.Randall Mccormick Jorie Blanch MD [facility]  If 7PM-7AM, please contact night-coverage [email or web address]  07/09/2024, 10:08 PM      [1]  Social History Tobacco Use   Smoking status: Never   Smokeless tobacco: Former    Types: Chew    Quit date: 2022  Vaping Use   Vaping status: Never Used  Substance Use Topics   Alcohol use: Yes    Alcohol/week: 1.0 standard drink of alcohol    Types: 1 Shots of liquor per week    Comment: 2 per night   Drug use: Not Currently  [2] No Known Allergies  "

## 2024-07-09 NOTE — ED Triage Notes (Signed)
 [facility] from home s/p wife call for concerns of fever and AMS. Recently seen for similar symptoms and discharged. 104 temp by EMS en route. A&Ox2 on arrival.

## 2024-07-09 NOTE — Hospital Course (Signed)
 Randall Mccormick is a 69 y.o. 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

## 2024-07-09 NOTE — Progress Notes (Signed)
 Elink following for sepsis protocol.

## 2024-07-10 DIAGNOSIS — R6520 Severe sepsis without septic shock: Secondary | ICD-10-CM | POA: Diagnosis not present

## 2024-07-10 DIAGNOSIS — A419 Sepsis, unspecified organism: Secondary | ICD-10-CM | POA: Diagnosis not present

## 2024-07-10 LAB — CBC
HCT: 50.9 % (ref 39.0–52.0)
Hemoglobin: 16.4 g/dL (ref 13.0–17.0)
MCH: 32.4 pg (ref 26.0–34.0)
MCHC: 32.2 g/dL (ref 30.0–36.0)
MCV: 100.6 fL — ABNORMAL HIGH (ref 80.0–100.0)
Platelets: 183 K/uL (ref 150–400)
RBC: 5.06 MIL/uL (ref 4.22–5.81)
RDW: 16.5 % — ABNORMAL HIGH (ref 11.5–15.5)
WBC: 21.2 K/uL — ABNORMAL HIGH (ref 4.0–10.5)
nRBC: 0 % (ref 0.0–0.2)

## 2024-07-10 LAB — HEPATIC FUNCTION PANEL
ALT: 32 U/L (ref 0–44)
AST: 37 U/L (ref 15–41)
Albumin: 3 g/dL — ABNORMAL LOW (ref 3.5–5.0)
Alkaline Phosphatase: 203 U/L — ABNORMAL HIGH (ref 38–126)
Bilirubin, Direct: 1.4 mg/dL — ABNORMAL HIGH (ref 0.0–0.2)
Indirect Bilirubin: 1.6 mg/dL — ABNORMAL HIGH (ref 0.3–0.9)
Total Bilirubin: 3 mg/dL — ABNORMAL HIGH (ref 0.0–1.2)
Total Protein: 6 g/dL — ABNORMAL LOW (ref 6.5–8.1)

## 2024-07-10 LAB — BASIC METABOLIC PANEL WITH GFR
Anion gap: 10 (ref 5–15)
BUN: 16 mg/dL (ref 8–23)
CO2: 25 mmol/L (ref 22–32)
Calcium: 8.4 mg/dL — ABNORMAL LOW (ref 8.9–10.3)
Chloride: 101 mmol/L (ref 98–111)
Creatinine, Ser: 0.94 mg/dL (ref 0.61–1.24)
GFR, Estimated: >60 mL/min
Glucose, Bld: 118 mg/dL — ABNORMAL HIGH (ref 70–99)
Potassium: 3.8 mmol/L (ref 3.5–5.1)
Sodium: 137 mmol/L (ref 135–145)

## 2024-07-10 LAB — PROTIME-INR
INR: 1.5 — ABNORMAL HIGH (ref 0.8–1.2)
Prothrombin Time: 19.2 s — ABNORMAL HIGH (ref 11.4–15.2)

## 2024-07-10 LAB — C DIFFICILE QUICK SCREEN W PCR REFLEX
C Diff antigen: POSITIVE — AB
C Diff interpretation: DETECTED
C Diff toxin: POSITIVE — AB

## 2024-07-10 LAB — HEPATITIS PANEL, ACUTE
HCV Ab: NONREACTIVE
Hep A IgM: NONREACTIVE
Hep B C IgM: NONREACTIVE
Hepatitis B Surface Ag: NONREACTIVE

## 2024-07-10 LAB — MAGNESIUM: Magnesium: 2.2 mg/dL (ref 1.7–2.4)

## 2024-07-10 MED ORDER — VANCOMYCIN HCL 125 MG PO CAPS
125.0000 mg | ORAL_CAPSULE | Freq: Four times a day (QID) | ORAL | Status: DC
  Administered 2024-07-10 – 2024-07-12 (×9): 125 mg via ORAL
  Filled 2024-07-10 (×17): qty 1

## 2024-07-10 NOTE — Progress Notes (Signed)
 Pt on unit from ED, a&ox4, VSS, fluid and antibiotic running through PIV, on 3L [place]. Pt oriented to room, call light within reach, and spoke to wife over phone.   07/10/24 1529  Vitals  Temp 97.6 F (36.4 C)  Temp Source Oral  BP 139/89  MAP (mmHg) 100  BP Location Right Wrist  BP Method Automatic  Patient Position (if appropriate) Lying  Pulse Rate (!) 118  Pulse Rate Source Monitor  ECG Heart Rate (!) 118  Resp 20  MEWS COLOR  MEWS Score Color Yellow  Oxygen Therapy  SpO2 95 %  O2 Device Nasal Cannula  O2 Flow Rate (L/min) 3 L/min  ECG Monitoring  PR interval 0.18  QRS interval 0.09  QT interval 0.29  QTc interval 0.4  CV Strip Heart Rate 118  Cardiac Rhythm ST (ADMIT)  MEWS Score  MEWS Temp 0  MEWS Systolic 0  MEWS Pulse 2  MEWS RR 0  MEWS LOC 1  MEWS Score 3

## 2024-07-10 NOTE — Progress Notes (Signed)
 " Progress Note   Patient: Randall Mccormick FMW:[id number] DOB: 1956/05/31 DOA: 07/09/2024     1 DOS: the patient was seen and examined on 07/10/2024    Brief hospital course: Randall Mccormick is a 69 y.o. 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 H/o multiple antibiotic courses over the past several months and C. difficile colitis felt likely.  Assessment and Plan:  Severe sepsis, present on admission Evidenced by lactic acidosis, hyperbilirubinemia, fever, leukocytosis in the setting of infection. Imaging studies not convincing for pneumonia. UA unremarkable. CT of the abdomen showed fluid throughout the colon consistent with a diarrheal state/enteritis. - Workup as below.  Acute enteritis Patient has had diarrhea for 3 days. Multiple watery bowel movements even despite Imodium which his wife gave him. High index of suspicion for C. difficile colitis as patient has had multiple courses of antibiotics over the past few months. - C. difficile testing - GI PP  Hyperbilirubinemia Possibly part of sepsis syndrome or due to colitis. - Follow-up hepatitis panel.  Hypertension - Hold home Norvasc  given relatively low pressures.   Incomplete paraplegia with neurogenic bladder: Patient with history of thoracic spinal fusion and chronic incomplete paraplegia with neurogenic bladder not requiring chronic Foley. - Continue fall precautions, bladder scan and In-N-Out cath as needed - Continue baclofen , gabapentin    Depression/anxiety: Continue home Prozac , Zyprexa , and Xanax  1 mg 3 times daily as needed with hold parameters.   Chronic pain: Continue home Oxy IR 15 mg every 8 hours as needed with hold parameters.   Gout: Continue allopurinol .  Hyperlipidemia: Continue rosuvastatin  and  Zetia .      Subjective: Patient denies abdominal pain.  Physical Exam: BP (!) 125/93   Pulse (!) 111   Temp 99.4 F (37.4 C) (Oral)   Resp 19   SpO2 100%    General: Alert, oriented X3  Eyes: Pupils equal, reactive  Oral cavity: moist mucous membranes  Head: Atraumatic, normocephalic  Neck: supple  Chest: clear to auscultation. No crackles, no wheezes  CVS: S1,S2 RRR. No murmurs  Abd: Distention +, soft, non-tender. No masses palpable  Extr: No edema   MSK: No joint deformities or swelling  Neurological: Grossly intact.    Data Reviewed:    Latest Ref Rng & Units 07/10/2024    4:06 AM 07/09/2024    6:45 PM 07/09/2024    6:30 PM  CBC  WBC 4.0 - 10.5 K/uL 21.2   21.7   Hemoglobin 13.0 - 17.0 g/dL 83.5  81.5  83.5   Hematocrit 39.0 - 52.0 % 50.9  54.0  51.1   Platelets 150 - 400 K/uL 183   227       Latest Ref Rng & Units 07/10/2024    4:06 AM 07/09/2024    6:45 PM 07/09/2024    6:26 PM  BMP  Glucose 70 - 99 mg/dL 881  883  886   BUN 8 - 23 mg/dL 16  15  14    Creatinine 0.61 - 1.24 mg/dL 9.05  8.99  8.92   Sodium 135 - 145 mmol/L 137  139  137   Potassium 3.5 - 5.1 mmol/L 3.8  3.8  4.0   Chloride 98 - 111 mmol/L 101  99  99   CO2 22 - 32 mmol/L 25   26   Calcium  8.9 - 10.3 mg/dL 8.4   8.5  Family Communication: Spoke with wife and daughter at bedside.  Disposition: Status is: Inpatient Remains inpatient appropriate because: Sepsis, on IV antibiotics.  DVT PPx: SQ Lovenox       Author: MDALA-GAUSI, Eretria Manternach AGATHA, MD 07/10/2024 1:13 PM  For on call review [email or web address].    "

## 2024-07-10 NOTE — ED Notes (Signed)
 Pt with several large episodes of watery diarrhea. Sample walked to the lab. MD messaged regarding rectal tube

## 2024-07-10 NOTE — ED Notes (Signed)
 Rectal tube inserted w/o difficulty. Intense peri care performed, barrier cream applied to all skin folds and excoriated areas. Pt transferred to hospital bed, gown changed. Pt repositioned for comfort.

## 2024-07-11 DIAGNOSIS — A419 Sepsis, unspecified organism: Secondary | ICD-10-CM | POA: Diagnosis not present

## 2024-07-11 DIAGNOSIS — R6520 Severe sepsis without septic shock: Secondary | ICD-10-CM | POA: Diagnosis not present

## 2024-07-11 LAB — COMPREHENSIVE METABOLIC PANEL WITH GFR
ALT: 21 U/L (ref 0–44)
AST: 30 U/L (ref 15–41)
Albumin: 2.2 g/dL — ABNORMAL LOW (ref 3.5–5.0)
Alkaline Phosphatase: 165 U/L — ABNORMAL HIGH (ref 38–126)
Anion gap: 12 (ref 5–15)
BUN: 25 mg/dL — ABNORMAL HIGH (ref 8–23)
CO2: 20 mmol/L — ABNORMAL LOW (ref 22–32)
Calcium: 8.3 mg/dL — ABNORMAL LOW (ref 8.9–10.3)
Chloride: 102 mmol/L (ref 98–111)
Creatinine, Ser: 1.33 mg/dL — ABNORMAL HIGH (ref 0.61–1.24)
GFR, Estimated: 58 mL/min — ABNORMAL LOW
Glucose, Bld: 144 mg/dL — ABNORMAL HIGH (ref 70–99)
Potassium: 4.6 mmol/L (ref 3.5–5.1)
Sodium: 133 mmol/L — ABNORMAL LOW (ref 135–145)
Total Bilirubin: 1.5 mg/dL — ABNORMAL HIGH (ref 0.0–1.2)
Total Protein: 5.3 g/dL — ABNORMAL LOW (ref 6.5–8.1)

## 2024-07-11 LAB — CBC
HCT: 52.5 % — ABNORMAL HIGH (ref 39.0–52.0)
Hemoglobin: 17.6 g/dL — ABNORMAL HIGH (ref 13.0–17.0)
MCH: 32.5 pg (ref 26.0–34.0)
MCHC: 33.5 g/dL (ref 30.0–36.0)
MCV: 96.9 fL (ref 80.0–100.0)
Platelets: 240 K/uL (ref 150–400)
RBC: 5.42 MIL/uL (ref 4.22–5.81)
RDW: 16.5 % — ABNORMAL HIGH (ref 11.5–15.5)
WBC: 25.1 K/uL — ABNORMAL HIGH (ref 4.0–10.5)
nRBC: 0 % (ref 0.0–0.2)

## 2024-07-11 LAB — GASTROINTESTINAL PANEL BY PCR, STOOL (REPLACES STOOL CULTURE)

## 2024-07-11 MED ORDER — LACTATED RINGERS IV SOLN: INTRAVENOUS | Status: DC

## 2024-07-11 NOTE — Progress Notes (Addendum)
 " Progress Note   Patient: Randall Mccormick FMW:[id number] DOB: [birth year] DOA: 07/09/2024     2 DOS: the patient was seen and examined on 07/11/2024    Brief hospital course: Randall Mccormick is a 69 y.o. 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 H/o multiple antibiotic courses over the past several months.  Assessment and Plan:  Severe sepsis, present on admission Evidenced by lactic acidosis, hyperbilirubinemia, fever, leukocytosis in the setting of infection. Imaging studies not convincing for pneumonia. UA unremarkable. CT of the abdomen showed fluid throughout the colon consistent with a diarrheal state/enteritis. Patient tested positive for C Diff.  - Will treat C Diff as indicated below.   C Diff colitis Patient has had diarrhea since 07/08/2024. High index of suspicion for C. difficile colitis as patient has had multiple courses of antibiotics over the past few months. Patient tested positive for C Diff on 1/17. The GIPP was negative.  - Started on PO vancomycin .  - Dced zosyn .  Acute kidney injury Likely prerenal due to volume losses from diarrhea and poor PO intake.  - IV fluids.  -Avoid nephrotoxins. - Daily BMP.  Hyperbilirubinemia Possibly part of sepsis syndrome or due to colitis. Hepatitis panel was negative.  - will continue to trend bilirubin.   Hypertension - Hold home Norvasc  given relatively low pressures.   Incomplete paraplegia with neurogenic bladder: Patient with history of thoracic spinal fusion and chronic incomplete paraplegia with neurogenic bladder not requiring chronic Foley. - Continue fall precautions, bladder scan and In-N-Out cath as needed - Continue baclofen , gabapentin   History of recurrent UTIs With history of multiple courses of antibiotics. This  will have to be addressed, given continued risk for C. difficile infection. - Close outpatient follow-up with PCP.   Depression/anxiety: Continue home Prozac , Zyprexa , and Xanax  1 mg 3 times daily as needed with hold parameters.   Chronic pain: Continue home Oxy IR 15 mg every 8 hours as needed with hold parameters.   Gout: Continue allopurinol .  Hyperlipidemia: Continue rosuvastatin  and Zetia .      Subjective: Patient denies abdominal pain.  No diarrhea this morning.  Physical Exam: BP 122/80 (BP Location: Right Arm)   Pulse (!) 107   Temp 97.7 F (36.5 C) (Oral)   Resp 19   SpO2 96%    General: Alert, oriented X3  Eyes: Pupils equal, reactive  Oral cavity: moist mucous membranes  Head: Atraumatic, normocephalic  Neck: supple  Chest: clear to auscultation. No crackles, no wheezes  CVS: S1,S2 RRR. No murmurs  Abd: Distention +, soft, non-tender. No masses palpable  Extr: No edema   MSK: No joint deformities or swelling  Neurological: Grossly intact.    Data Reviewed:    Latest Ref Rng & Units 07/11/2024    2:44 AM 07/10/2024    4:06 AM 07/09/2024    6:45 PM  CBC  WBC 4.0 - 10.5 K/uL 25.1  21.2    Hemoglobin 13.0 - 17.0 g/dL 82.3  83.5  81.5   Hematocrit 39.0 - 52.0 % 52.5  50.9  54.0   Platelets 150 - 400 K/uL 240  183        Latest Ref Rng & Units 07/11/2024    2:44 AM 07/10/2024    4:06 AM 07/09/2024    6:45 PM  BMP  Glucose 70 - 99 mg/dL 855  881  883  BUN 8 - 23 mg/dL 25  16  15    Creatinine 0.61 - 1.24 mg/dL 8.66  9.05  8.99   Sodium 135 - 145 mmol/L 133  137  139   Potassium 3.5 - 5.1 mmol/L 4.6  3.8  3.8   Chloride 98 - 111 mmol/L 102  101  99   CO2 22 - 32 mmol/L 20  25    Calcium  8.9 - 10.3 mg/dL 8.3  8.4       Family Communication: Spoke with daughter at bedside.  Disposition: Status is: Inpatient Remains inpatient appropriate because: Sepsis, being treated for C. difficile colitis, acute kidney injury.  DVT PPx: SQ  Lovenox       Author: MDALA-GAUSI, Eddi Hymes AGATHA, MD 07/11/2024 1:05 PM  For on call review [email or web address].    "

## 2024-07-12 ENCOUNTER — Telehealth (HOSPITAL_COMMUNITY): Payer: Self-pay

## 2024-07-12 ENCOUNTER — Other Ambulatory Visit (HOSPITAL_COMMUNITY): Payer: Self-pay

## 2024-07-12 DIAGNOSIS — R6520 Severe sepsis without septic shock: Secondary | ICD-10-CM | POA: Diagnosis not present

## 2024-07-12 DIAGNOSIS — A419 Sepsis, unspecified organism: Secondary | ICD-10-CM | POA: Diagnosis not present

## 2024-07-12 LAB — COMPREHENSIVE METABOLIC PANEL WITH GFR
ALT: 25 U/L (ref 0–44)
AST: 41 U/L (ref 15–41)
Albumin: 2.2 g/dL — ABNORMAL LOW (ref 3.5–5.0)
Alkaline Phosphatase: 168 U/L — ABNORMAL HIGH (ref 38–126)
Anion gap: 11 (ref 5–15)
BUN: 46 mg/dL — ABNORMAL HIGH (ref 8–23)
CO2: 22 mmol/L (ref 22–32)
Calcium: 8 mg/dL — ABNORMAL LOW (ref 8.9–10.3)
Chloride: 98 mmol/L (ref 98–111)
Creatinine, Ser: 1.95 mg/dL — ABNORMAL HIGH (ref 0.61–1.24)
GFR, Estimated: 37 mL/min — ABNORMAL LOW
Glucose, Bld: 130 mg/dL — ABNORMAL HIGH (ref 70–99)
Potassium: 5.1 mmol/L (ref 3.5–5.1)
Sodium: 131 mmol/L — ABNORMAL LOW (ref 135–145)
Total Bilirubin: 0.8 mg/dL (ref 0.0–1.2)
Total Protein: 5.3 g/dL — ABNORMAL LOW (ref 6.5–8.1)

## 2024-07-12 LAB — CBC
HCT: 54.3 % — ABNORMAL HIGH (ref 39.0–52.0)
Hemoglobin: 17.9 g/dL — ABNORMAL HIGH (ref 13.0–17.0)
MCH: 32.2 pg (ref 26.0–34.0)
MCHC: 33 g/dL (ref 30.0–36.0)
MCV: 97.7 fL (ref 80.0–100.0)
Platelets: 238 K/uL (ref 150–400)
RBC: 5.56 MIL/uL (ref 4.22–5.81)
RDW: 16.9 % — ABNORMAL HIGH (ref 11.5–15.5)
WBC: 27.2 K/uL — ABNORMAL HIGH (ref 4.0–10.5)
nRBC: 0.1 % (ref 0.0–0.2)

## 2024-07-12 NOTE — Plan of Care (Signed)
  Problem: Fluid Volume: Goal: Hemodynamic stability will improve Outcome: Progressing   Problem: Clinical Measurements: Goal: Diagnostic test results will improve Outcome: Progressing Goal: Signs and symptoms of infection will decrease Outcome: Progressing   Problem: Respiratory: Goal: Ability to maintain adequate ventilation will improve Outcome: Progressing   Problem: Respiratory: Goal: Ability to maintain adequate ventilation will improve Outcome: Progressing

## 2024-07-12 NOTE — Telephone Encounter (Signed)
 Pharmacy Patient Advocate Encounter  Insurance verification completed.    The patient is insured through [employer]. Patient has Medicare and is not eligible for a copay card, but may be able to apply for patient assistance or Medicare RX Payment Plan (Patient Must reach out to their plan, if eligible for payment plan), if available.    Ran test claim for Vancomycin  125mg  capsule and the current 10 day co-pay is $57.98 due to deductible.   This test claim was processed through [facility]- copay amounts may vary at other pharmacies due to pharmacy/plan contracts, or as the patient moves through the different stages of their insurance plan.

## 2024-07-12 NOTE — Progress Notes (Addendum)
 " PROGRESS NOTE  Randall Mccormick FMW:[id number] DOB: [birth year] DOA: 07/09/2024 PCP: Keren [name], MD   LOS: 3 days   Brief narrative:  Patient is a Randall Mccormick is a 69 y.o. 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  In the ED, patient was tachycardic with fever.  Labs showed creatinine of 1.0.  Lactate of 2.3.  Total bilirubin of 2.6.  COVID influenza and RSV was negative.  Chest x-ray showed cardiomegaly.  CT scan of the abdomen pelvis showed fluid throughout the colon consistent with diarrhea/enteritis.   Assessment/Plan: Principal Problem:   Severe sepsis (HCC) Active Problems:   Acute metabolic encephalopathy   Enteritis   Hyperlipidemia   Hypertension   Depression with anxiety   Gouty arthropathy   Chronic pain syndrome   Incomplete paraplegia (HCC)   Hyperbilirubinemia   Severe sepsis due to C. difficile enteritis, POA: Patient presented with fever, leukocytosis, lactic acidosis, tachycardia.  CT suggestive of diarrheal illness/enteritis.  Stool tested positive for C. difficile given multiple courses of antibiotics, most recently Levaquin in the last week.  Continue with oral vancomycin .  Temperature max of 101.3 F.  Patient still with loose stools on rectal tube.  WBC at 27.2.  Cough congestion.  Chest x-ray recently done showed atelectasis.  Will encourage incentive spirometer.  Will get chest physiotherapy with flutter valve and chest vest that might help him especially in his condition.  Acute metabolic encephalopathy: Secondary to sepsis.  Alert awake and Communicative at this time.  Resolved.   Hyperbilirubinemia: Likely secondary to sepsis.  Total bilirubin of 1.5.   Hypertension: BP was borderline low.  Amlodipine  oral on  hold  Hyperlipidemia: Continue rosuvastatin  and Zetia .   Incomplete paraplegia with neurogenic bladder: History of thoracic spinal fusion and chronic incomplete paraplegia with neurogenic bladder not requiring chronic Foley but on external PureWick catheter..  Continue fall precautions.  Continue baclofen  and gabapentin .  At home he does use a walker and walks some as per the spouse.  Will get PT OT evaluation.   Depression/anxiety: Continue home Prozac , Zyprexa , and Xanax     Chronic pain: Continue home Oxy IR 15 mg every 8 hours as needed with hold parameters.   Gout: Continue allopurinol .  DVT prophylaxis: enoxaparin  (LOVENOX ) injection 40 mg Start: 07/09/24 2200   Disposition: Home likely in 1 to 2 days, patient lives with his spouse at home.  Status is: Inpatient Remains inpatient appropriate because: Pending clinical improvement, ongoing diarrhea, PT evaluation    Code Status:     Code Status: Full Code  Family Communication: None at bedside. I spoke with the the patient's spouse on the phone and updated her about the clinical condition of the patient.  Consultants: None  Procedures: None  Anti-infectives:  Vancomycin  oral  Anti-infectives (From admission, onward)    Start     Dose/Rate Route Frequency Ordered Stop   07/10/24 1800  vancomycin  (VANCOCIN ) capsule 125 mg        125 mg Oral 4 times daily 07/10/24 1704 07/20/24 1759   07/10/24 0200  piperacillin -tazobactam (ZOSYN ) IVPB 3.375 g  Status:  Discontinued        3.375 g 12.5 mL/hr over 240 Minutes Intravenous Every 8 hours 07/09/24 2158 07/10/24 1703   07/09/24 1845  vancomycin  (VANCOREADY) IVPB 2000 mg/400 mL        2,000 mg 200 mL/hr over 120  Minutes Intravenous  Once 07/09/24 1831 07/09/24 2146   07/09/24 1830  ceFEPIme  (MAXIPIME ) 2 g in sodium chloride  0.9 % 100 mL IVPB        2 g 200 mL/hr over 30 Minutes Intravenous  Once 07/09/24 1826 07/09/24 1915   07/09/24 1830  metroNIDAZOLE  (FLAGYL ) IVPB  500 mg        500 mg 100 mL/hr over 60 Minutes Intravenous  Once 07/09/24 1826 07/09/24 2045   07/09/24 1830  vancomycin  (VANCOCIN ) IVPB 1000 mg/200 mL premix  Status:  Discontinued        1,000 mg 200 mL/hr over 60 Minutes Intravenous  Once 07/09/24 1826 07/09/24 1831        Subjective: Today, patient was seen and examined at bedside.  Patient denies any nausea vomiting or abdominal pain.  Denies any urinary urgency frequency or dysuria.  Continues to have loose stools in the rectal tube.  Objective: Vitals:   07/12/24 0634 07/12/24 0800  BP:  115/78  Pulse: (!) 109 (!) 111  Resp: 17 20  Temp:  99 F (37.2 C)  SpO2: 96% 95%    Intake/Output Summary (Last 24 hours) at 07/12/2024 1141 Last data filed at 07/12/2024 0338 Gross per 24 hour  Intake 1000.22 ml  Output 700 ml  Net 300.22 ml   There were no vitals filed for this visit. There is no height or weight on file to calculate BMI.   Physical Exam: GENERAL: Patient is alert awake and Communicative.  Not in obvious distress. HENT: No scleral pallor or icterus. Pupils equally reactive to light. Oral mucosa is moist NECK: is supple, no gross swelling noted. CHEST: Clear to auscultation. No crackles or wheezes.  CVS: S1 and S2 heard, no murmur. Regular rate and rhythm.  ABDOMEN: Soft, non-tender, distention, bowel sounds are present. EXTREMITIES: Quadriparesis CNS: Cranial nerves are intact.  quadriparesis SKIN: warm and dry without rashes.  Data Review: I have personally reviewed the following laboratory data and studies,  CBC: Recent Labs  Lab 07/09/24 1830 07/09/24 1845 07/10/24 0406 07/11/24 0244 07/12/24 0816  WBC 21.7*  --  21.2* 25.1* 27.2*  NEUTROABS 18.0*  --   --   --   --   HGB 16.4 18.4* 16.4 17.6* 17.9*  HCT 51.1 54.0* 50.9 52.5* 54.3*  MCV 100.4*  --  100.6* 96.9 97.7  PLT 227  --  183 240 238   Basic Metabolic Panel: Recent Labs  Lab 07/09/24 1826 07/09/24 1845 07/10/24 0406 07/11/24 0244  07/12/24 0816  NA 137 139 137 133* 131*  K 4.0 3.8 3.8 4.6 5.1  CL 99 99 101 102 98  CO2 26  --  25 20* 22  GLUCOSE 113* 116* 118* 144* 130*  BUN 14 15 16  25* 46*  CREATININE 1.07 1.00 0.94 1.33* 1.95*  CALCIUM  8.5*  --  8.4* 8.3* 8.0*  MG  --   --  2.2  --   --    Liver Function Tests: Recent Labs  Lab 07/09/24 1826 07/10/24 0406 07/11/24 0244 07/12/24 0816  AST 48* 37 30 41  ALT 36 32 21 25  ALKPHOS 241* 203* 165* 168*  BILITOT 2.6* 3.0* 1.5* 0.8  PROT 7.0 6.0* 5.3* 5.3*  ALBUMIN 3.5 3.0* 2.2* 2.2*   No results for input(s): LIPASE, AMYLASE in the last 168 hours. No results for input(s): AMMONIA in the last 168 hours. Cardiac Enzymes: No results for input(s): CKTOTAL, CKMB, CKMBINDEX, TROPONINI in the last 168 hours. BNP (  last 3 results) Recent Labs    12/23/23 1844 05/15/24 0755  BNP 8.4 28.3    ProBNP (last 3 results) No results for input(s): PROBNP in the last 8760 hours.  CBG: No results for input(s): GLUCAP in the last 168 hours. Recent Results (from the past 240 hours)  Blood Culture (routine x 2)     Status: None (Preliminary result)   Collection Time: 07/09/24  6:15 PM   Specimen: BLOOD LEFT ARM  Result Value Ref Range Status   Specimen Description BLOOD LEFT ARM  Final   Special Requests   Final    BOTTLES DRAWN AEROBIC AND ANAEROBIC Blood Culture results may not be optimal due to an inadequate volume of blood received in culture bottles   Culture   Final    NO GROWTH 3 DAYS Performed at [facility], [address], [place], [place] [address]    Report Status PENDING  Incomplete  Resp panel by RT-PCR (RSV, Flu A&B, Covid) Anterior Nasal Swab     Status: None   Collection Time: 07/09/24  6:26 PM   Specimen: Anterior Nasal Swab  Result Value Ref Range Status   SARS Coronavirus 2 by RT PCR NEGATIVE NEGATIVE Final   Influenza A by PCR NEGATIVE NEGATIVE Final   Influenza B by PCR NEGATIVE NEGATIVE Final    Comment:  (NOTE) The Xpert Xpress SARS-CoV-2/FLU/RSV plus assay is intended as an aid in the diagnosis of influenza from Nasopharyngeal swab specimens and should not be used as a sole basis for treatment. Nasal washings and aspirates are unacceptable for Xpert Xpress SARS-CoV-2/FLU/RSV testing.  Fact Sheet for Patients: bloggercourse.com  Fact Sheet for Healthcare Providers: seriousbroker.it  This test is not yet approved or cleared by the [place]  FDA and has been authorized for detection and/or diagnosis of SARS-CoV-2 by FDA under an Emergency Use Authorization (EUA). This EUA will remain in effect (meaning this test can be used) for the duration of the COVID-19 declaration under Section 564(b)(1) of the Act, 21 U.S.C. section 360bbb-3(b)(1), unless the authorization is terminated or revoked.     Resp Syncytial Virus by PCR NEGATIVE NEGATIVE Final    Comment: (NOTE) Fact Sheet for Patients: bloggercourse.com  Fact Sheet for Healthcare Providers: seriousbroker.it  This test is not yet approved or cleared by the [place]  FDA and has been authorized for detection and/or diagnosis of SARS-CoV-2 by FDA under an Emergency Use Authorization (EUA). This EUA will remain in effect (meaning this test can be used) for the duration of the COVID-19 declaration under Section 564(b)(1) of the Act, 21 U.S.C. section 360bbb-3(b)(1), unless the authorization is terminated or revoked.  Performed at [facility], [address] [place], [place] [address]   Blood Culture (routine x 2)     Status: None (Preliminary result)   Collection Time: 07/09/24  6:30 PM   Specimen: BLOOD RIGHT ARM  Result Value Ref Range Status   Specimen Description BLOOD RIGHT ARM  Final   Special Requests   Final    BOTTLES DRAWN AEROBIC AND ANAEROBIC Blood Culture results may not be optimal due to an inadequate  volume of blood received in culture bottles   Culture   Final    NO GROWTH 3 DAYS Performed at [facility], [address], [place], [place] [address]    Report Status PENDING  Incomplete  C Difficile Quick Screen w PCR reflex     Status: Abnormal   Collection Time: 07/09/24  7:14 PM   Specimen: STOOL  Result Value Ref Range Status   C Diff antigen POSITIVE (A) NEGATIVE Final   C Diff toxin POSITIVE (A) NEGATIVE Final   C Diff interpretation Toxin producing C. difficile detected.  Final    Comment: RESULT CALLED TO, READ BACK BY AND VERIFIED WITH:  CHARGE NURSE SARAH B. @1434  ON 07/10/24 BY DT Performed at [facility], [address] [place], [place] [address]   Gastrointestinal Panel by PCR , Stool     Status: None   Collection Time: 07/09/24  7:14 PM   Specimen: STOOL  Result Value Ref Range Status   Campylobacter species NOT DETECTED NOT DETECTED Final   Plesimonas shigelloides NOT DETECTED NOT DETECTED Final   Salmonella species NOT DETECTED NOT DETECTED Final   Yersinia enterocolitica NOT DETECTED NOT DETECTED Final   Vibrio species NOT DETECTED NOT DETECTED Final   Vibrio cholerae NOT DETECTED NOT DETECTED Final   Enteroaggregative E coli (EAEC) NOT DETECTED NOT DETECTED Final   Enteropathogenic E coli (EPEC) NOT DETECTED NOT DETECTED Final   Enterotoxigenic E coli (ETEC) NOT DETECTED NOT DETECTED Final   Shiga like toxin producing E coli (STEC) NOT DETECTED NOT DETECTED Final   Shigella/Enteroinvasive E coli (EIEC) NOT DETECTED NOT DETECTED Final   Cryptosporidium NOT DETECTED NOT DETECTED Final   Cyclospora cayetanensis NOT DETECTED NOT DETECTED Final   Entamoeba histolytica NOT DETECTED NOT DETECTED Final   Giardia lamblia NOT DETECTED NOT DETECTED Final   Adenovirus F40/41 NOT DETECTED NOT DETECTED Final   Astrovirus NOT DETECTED NOT DETECTED Final   Norovirus GI/GII NOT DETECTED NOT DETECTED Final   Rotavirus A NOT DETECTED NOT DETECTED Final    Sapovirus (I, II, IV, and V) NOT DETECTED NOT DETECTED Final    Comment: Performed at [facility], [address] [place], [place] [address]     Studies: No results found.    Gustavus Haskin, MD  [facility] 07/12/2024  If 7PM-7AM, please contact night-coverage             "

## 2024-07-12 NOTE — Progress Notes (Signed)
" °   07/11/24 2307  Vitals  Temp (!) 101.3 F (38.5 C)  Temp Source Oral  BP 118/80  MAP (mmHg) 92  BP Location Right Arm  BP Method Automatic  Patient Position (if appropriate) Lying  Pulse Rate (!) 122  Pulse Rate Source Monitor  ECG Heart Rate (!) 124  Resp (!) 23  Level of Consciousness  Level of Consciousness Alert  MEWS COLOR  MEWS Score Color Red  Oxygen Therapy  SpO2 95 %  MEWS Score  MEWS Temp 1  MEWS Systolic 0  MEWS Pulse 2  MEWS RR 1  MEWS LOC 0  MEWS Score 4   Patient was noted with a fever, and elevated HR. Tylenol  650mg  was administered per PRN order, Temp is slowly dropping to base line. Will continue to monitor. "

## 2024-07-13 ENCOUNTER — Inpatient Hospital Stay (HOSPITAL_COMMUNITY): Payer: MEDICARE

## 2024-07-13 DIAGNOSIS — A419 Sepsis, unspecified organism: Secondary | ICD-10-CM | POA: Diagnosis not present

## 2024-07-13 DIAGNOSIS — J9691 Respiratory failure, unspecified with hypoxia: Secondary | ICD-10-CM | POA: Diagnosis not present

## 2024-07-13 DIAGNOSIS — F419 Anxiety disorder, unspecified: Secondary | ICD-10-CM | POA: Diagnosis not present

## 2024-07-13 DIAGNOSIS — A0472 Enterocolitis due to Clostridium difficile, not specified as recurrent: Secondary | ICD-10-CM | POA: Diagnosis not present

## 2024-07-13 DIAGNOSIS — I10 Essential (primary) hypertension: Secondary | ICD-10-CM

## 2024-07-13 DIAGNOSIS — F32A Depression, unspecified: Secondary | ICD-10-CM | POA: Diagnosis not present

## 2024-07-13 DIAGNOSIS — R569 Unspecified convulsions: Secondary | ICD-10-CM

## 2024-07-13 DIAGNOSIS — G9341 Metabolic encephalopathy: Secondary | ICD-10-CM | POA: Diagnosis not present

## 2024-07-13 DIAGNOSIS — E785 Hyperlipidemia, unspecified: Secondary | ICD-10-CM | POA: Diagnosis not present

## 2024-07-13 DIAGNOSIS — R4182 Altered mental status, unspecified: Secondary | ICD-10-CM | POA: Diagnosis not present

## 2024-07-13 DIAGNOSIS — R6520 Severe sepsis without septic shock: Secondary | ICD-10-CM | POA: Diagnosis not present

## 2024-07-13 LAB — CBC
HCT: 52.2 % — ABNORMAL HIGH (ref 39.0–52.0)
Hemoglobin: 17.3 g/dL — ABNORMAL HIGH (ref 13.0–17.0)
MCH: 32 pg (ref 26.0–34.0)
MCHC: 33.1 g/dL (ref 30.0–36.0)
MCV: 96.5 fL (ref 80.0–100.0)
Platelets: 251 K/uL (ref 150–400)
RBC: 5.41 MIL/uL (ref 4.22–5.81)
RDW: 16.9 % — ABNORMAL HIGH (ref 11.5–15.5)
WBC: 23.8 K/uL — ABNORMAL HIGH (ref 4.0–10.5)
nRBC: 0.1 % (ref 0.0–0.2)

## 2024-07-13 LAB — POCT I-STAT 7, (LYTES, BLD GAS, ICA,H+H)
Acid-base deficit: 3 mmol/L — ABNORMAL HIGH (ref 0.0–2.0)
Acid-base deficit: 3 mmol/L — ABNORMAL HIGH (ref 0.0–2.0)
Bicarbonate: 23.1 mmol/L (ref 20.0–28.0)
Bicarbonate: 22.7 mmol/L (ref 20.0–28.0)
Calcium, Ion: 1.07 mmol/L — ABNORMAL LOW (ref 1.15–1.40)
Calcium, Ion: 1.06 mmol/L — ABNORMAL LOW (ref 1.15–1.40)
HCT: 54 % — ABNORMAL HIGH (ref 39.0–52.0)
HCT: 54 % — ABNORMAL HIGH (ref 39.0–52.0)
Hemoglobin: 18.4 g/dL — ABNORMAL HIGH (ref 13.0–17.0)
Hemoglobin: 18.4 g/dL — ABNORMAL HIGH (ref 13.0–17.0)
O2 Saturation: 88 %
O2 Saturation: 94 %
Patient temperature: 37.9
Patient temperature: 100.5
Potassium: 4.8 mmol/L (ref 3.5–5.1)
Potassium: 5 mmol/L (ref 3.5–5.1)
Sodium: 130 mmol/L — ABNORMAL LOW (ref 135–145)
Sodium: 133 mmol/L — ABNORMAL LOW (ref 135–145)
TCO2: 24 mmol/L (ref 22–32)
TCO2: 24 mmol/L (ref 22–32)
pCO2 arterial: 44.7 mmHg (ref 32–48)
pCO2 arterial: 42.9 mmHg (ref 32–48)
pH, Arterial: 7.326 — ABNORMAL LOW (ref 7.35–7.45)
pH, Arterial: 7.335 — ABNORMAL LOW (ref 7.35–7.45)
pO2, Arterial: 62 mmHg — ABNORMAL LOW (ref 83–108)
pO2, Arterial: 81 mmHg — ABNORMAL LOW (ref 83–108)

## 2024-07-13 LAB — BASIC METABOLIC PANEL WITH GFR
Anion gap: 12 (ref 5–15)
BUN: 65 mg/dL — ABNORMAL HIGH (ref 8–23)
CO2: 21 mmol/L — ABNORMAL LOW (ref 22–32)
Calcium: 7.7 mg/dL — ABNORMAL LOW (ref 8.9–10.3)
Chloride: 99 mmol/L (ref 98–111)
Creatinine, Ser: 2.44 mg/dL — ABNORMAL HIGH (ref 0.61–1.24)
GFR, Estimated: 28 mL/min — ABNORMAL LOW
Glucose, Bld: 125 mg/dL — ABNORMAL HIGH (ref 70–99)
Potassium: 5 mmol/L (ref 3.5–5.1)
Sodium: 132 mmol/L — ABNORMAL LOW (ref 135–145)

## 2024-07-13 LAB — AMMONIA: Ammonia: 35 umol/L (ref 9–35)

## 2024-07-13 LAB — MAGNESIUM: Magnesium: 3 mg/dL — ABNORMAL HIGH (ref 1.7–2.4)

## 2024-07-13 MED ORDER — ACETAMINOPHEN 10 MG/ML IV SOLN
1000.0000 mg | Freq: Four times a day (QID) | INTRAVENOUS | Status: AC
  Administered 2024-07-13 – 2024-07-14 (×4): 1000 mg via INTRAVENOUS
  Filled 2024-07-13 (×4): qty 100

## 2024-07-13 MED ORDER — LACTATED RINGERS IV SOLN: INTRAVENOUS | Status: AC

## 2024-07-13 NOTE — Progress Notes (Signed)
 RT was called due to PT increased WOB. PT was increased from 2L [place] to 4L. Rhonchi / fine crackle BS. PRN DUO given, ABG obtained. PT unarousable to verbal and tactile stimuli. Rn was made aware and contacting provider. RT will continue to monitor.

## 2024-07-13 NOTE — Progress Notes (Addendum)
 " PROGRESS NOTE  Randall Mccormick FMW:[id number] DOB: 1956-01-25 DOA: 07/09/2024 PCP: Keren [name], MD   LOS: 4 days   Brief narrative:  Patient is a Randall Mccormick is a 69 y.o. 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  In the ED, patient was tachycardic with fever.  Labs showed creatinine of 1.0.  Lactate of 2.3.  Total bilirubin of 2.6.  COVID influenza and RSV was negative.  Chest x-ray showed cardiomegaly.  CT scan of the abdomen pelvis showed fluid throughout the colon consistent with diarrhea/enteritis.  During hospitalization patient had increased work of breathing and was put on BiPAP and has been having increased somnolence.  Critical care has been consulted.   Assessment/Plan: Principal Problem:   Severe sepsis (HCC) Active Problems:   Acute metabolic encephalopathy   Enteritis   Hyperlipidemia   Hypertension   Depression with anxiety   Gouty arthropathy   Chronic pain syndrome   Incomplete paraplegia (HCC)   Hyperbilirubinemia  Respiratory distress, somnolence likely Metabolic encephalopathy.  Initial chest x-ray recently done showed atelectasis.  Repeat chest x-ray from this morning shows patchy right lower lobe opacity with atelectasis likely.  Currently on BiPAP but is very somnolent and nonresponsive to sternal rub.  ABG showed normal CO2 but hypoxia.  PCCM on board and at this time PCCM has recommended continued observation on BiPAP with IV fluids for possible uremia.  Currently patient is protecting airway.  Patient's family at bedside is aware of this situation and wishes to proceed with intubation and other aggressive measures if necessary.  Will add CT scan of the head and ammonia levels.  Severe sepsis due to C. difficile enteritis, POA: Patient presented  with fever, leukocytosis, lactic acidosis, tachycardia.  CT suggestive of diarrheal illness/enteritis.  Stool tested positive for C. difficile given multiple courses of antibiotics, most recently Levaquin in the last week.  Continue with oral vancomycin  but currently somnolent.  Has rectal tube in place..  Temperature max of 100.9 F..  Patient still with loose stools on rectal tube.  WBC at 23.8.   Hyperbilirubinemia: Likely secondary to sepsis.  Total bilirubin of 0.8.   Hypertension: BP was borderline low.  Amlodipine  oral on hold.  Latest blood pressure of 105/69.  Hyperlipidemia: On rosuvastatin  and Zetia .   Incomplete paraplegia with neurogenic bladder: History of thoracic spinal fusion and chronic incomplete paraplegia with neurogenic bladder not requiring chronic Foley but on external PureWick catheter..  Continue fall precautions.  Continue baclofen  and gabapentin .  At home he does use a walker and walks some as per the spouse.  Will get PT OT evaluation when clinically feeling better.   Depression/anxiety: Continue home Prozac , Zyprexa , and Xanax     Chronic pain: Continue home Oxy IR 15 mg every 8 hours as needed with hold parameters.  Will continue to hold narcotics due to somnolence.  Has not received narcotics yesterday.   Gout: Continue allopurinol .  DVT prophylaxis: enoxaparin  (LOVENOX ) injection 40 mg Start: 07/09/24 2200   Disposition: Uncertain at this time  Status is: Inpatient Remains inpatient appropriate because: Pending clinical improvement, ongoing diarrhea, PT evaluation, on BiPAP altered mental status,    Code Status:     Code Status: Full Code  Family Communication: .  I have a prolonged discussion with the patient's spouse at bedside.  She would like to continue with any level of care that  the patient might be necessary ncluding intubation i  Consultants: Critical care  Procedures: BiPAP  Anti-infectives:  Vancomycin  oral  Anti-infectives  (From admission, onward)    Start     Dose/Rate Route Frequency Ordered Stop   07/10/24 1800  vancomycin  (VANCOCIN ) capsule 125 mg        125 mg Oral 4 times daily 07/10/24 1704 07/20/24 1759   07/10/24 0200  piperacillin -tazobactam (ZOSYN ) IVPB 3.375 g  Status:  Discontinued        3.375 g 12.5 mL/hr over 240 Minutes Intravenous Every 8 hours 07/09/24 2158 07/10/24 1703   07/09/24 1845  vancomycin  (VANCOREADY) IVPB 2000 mg/400 mL        2,000 mg 200 mL/hr over 120 Minutes Intravenous  Once 07/09/24 1831 07/09/24 2146   07/09/24 1830  ceFEPIme  (MAXIPIME ) 2 g in sodium chloride  0.9 % 100 mL IVPB        2 g 200 mL/hr over 30 Minutes Intravenous  Once 07/09/24 1826 07/09/24 1915   07/09/24 1830  metroNIDAZOLE  (FLAGYL ) IVPB 500 mg        500 mg 100 mL/hr over 60 Minutes Intravenous  Once 07/09/24 1826 07/09/24 2045   07/09/24 1830  vancomycin  (VANCOCIN ) IVPB 1000 mg/200 mL premix  Status:  Discontinued        1,000 mg 200 mL/hr over 60 Minutes Intravenous  Once 07/09/24 1826 07/09/24 1831        Subjective:  Today, patient was seen and examined at bedside.  Patient had increased work of breathing with increased oxygen requirement with worsening mentation and somnolence.  Patient was subsequently upgraded on nasal cannula and has been on BiPAP with minimal response.  Low-grade fever noted.  Objective: Vitals:   07/13/24 0746 07/13/24 0836  BP: 105/69   Pulse:  (!) 112  Resp:  (!) 25  Temp: (!) 100.5 F (38.1 C)   SpO2:  99%    Intake/Output Summary (Last 24 hours) at 07/13/2024 1103 Last data filed at 07/13/2024 9187 Gross per 24 hour  Intake 3 ml  Output 1320 ml  Net -1317 ml   There were no vitals filed for this visit. There is no height or weight on file to calculate BMI.   Physical Exam: GENERAL: Patient is lethargic, unresponsive to painful stimuli.  On BiPAP, obese HENT: No scleral pallor or icterus. Pupils equally reactive to light.  On BiPAP. NECK: is supple, no  gross swelling noted. CHEST: Diminished breath sounds bilaterally, CVS: S1 and S2 heard, no murmur. Regular rate and rhythm.  ABDOMEN: Soft, non-tender, distention, bowel sounds are present. EXTREMITIES: Quadriparesis CNS:   quadriparesis SKIN: warm and dry without rashes.  Data Review: I have personally reviewed the following laboratory data and studies,  CBC: Recent Labs  Lab 07/09/24 1830 07/09/24 1845 07/10/24 0406 07/11/24 0244 07/12/24 0816 07/13/24 0138 07/13/24 0526 07/13/24 0832  WBC 21.7*  --  21.2* 25.1* 27.2*  --  23.8*  --   NEUTROABS 18.0*  --   --   --   --   --   --   --   HGB 16.4   < > 16.4 17.6* 17.9* 18.4* 17.3* 18.4*  HCT 51.1   < > 50.9 52.5* 54.3* 54.0* 52.2* 54.0*  MCV 100.4*  --  100.6* 96.9 97.7  --  96.5  --   PLT 227  --  183 240 238  --  251  --    < > = values in this interval not  displayed.   Basic Metabolic Panel: Recent Labs  Lab 07/09/24 1826 07/09/24 1845 07/10/24 0406 07/11/24 0244 07/12/24 0816 07/13/24 0138 07/13/24 0526 07/13/24 0832  NA 137 139 137 133* 131* 130* 132* 133*  K 4.0 3.8 3.8 4.6 5.1 4.8 5.0 5.0  CL 99 99 101 102 98  --  99  --   CO2 26  --  25 20* 22  --  21*  --   GLUCOSE 113* 116* 118* 144* 130*  --  125*  --   BUN 14 15 16  25* 46*  --  65*  --   CREATININE 1.07 1.00 0.94 1.33* 1.95*  --  2.44*  --   CALCIUM  8.5*  --  8.4* 8.3* 8.0*  --  7.7*  --   MG  --   --  2.2  --   --   --  3.0*  --    Liver Function Tests: Recent Labs  Lab 07/09/24 1826 07/10/24 0406 07/11/24 0244 07/12/24 0816  AST 48* 37 30 41  ALT 36 32 21 25  ALKPHOS 241* 203* 165* 168*  BILITOT 2.6* 3.0* 1.5* 0.8  PROT 7.0 6.0* 5.3* 5.3*  ALBUMIN 3.5 3.0* 2.2* 2.2*   No results for input(s): LIPASE, AMYLASE in the last 168 hours. No results for input(s): AMMONIA in the last 168 hours. Cardiac Enzymes: No results for input(s): CKTOTAL, CKMB, CKMBINDEX, TROPONINI in the last 168 hours. BNP (last 3 results) Recent Labs     12/23/23 1844 05/15/24 0755  BNP 8.4 28.3    ProBNP (last 3 results) No results for input(s): PROBNP in the last 8760 hours.  CBG: No results for input(s): GLUCAP in the last 168 hours. Recent Results (from the past 240 hours)  Blood Culture (routine x 2)     Status: None (Preliminary result)   Collection Time: 07/09/24  6:15 PM   Specimen: BLOOD LEFT ARM  Result Value Ref Range Status   Specimen Description BLOOD LEFT ARM  Final   Special Requests   Final    BOTTLES DRAWN AEROBIC AND ANAEROBIC Blood Culture results may not be optimal due to an inadequate volume of blood received in culture bottles   Culture   Final    NO GROWTH 4 DAYS Performed at [facility], [address], [place], [place] [address]    Report Status PENDING  Incomplete  Resp panel by RT-PCR (RSV, Flu A&B, Covid) Anterior Nasal Swab     Status: None   Collection Time: 07/09/24  6:26 PM   Specimen: Anterior Nasal Swab  Result Value Ref Range Status   SARS Coronavirus 2 by RT PCR NEGATIVE NEGATIVE Final   Influenza A by PCR NEGATIVE NEGATIVE Final   Influenza B by PCR NEGATIVE NEGATIVE Final    Comment: (NOTE) The Xpert Xpress SARS-CoV-2/FLU/RSV plus assay is intended as an aid in the diagnosis of influenza from Nasopharyngeal swab specimens and should not be used as a sole basis for treatment. Nasal washings and aspirates are unacceptable for Xpert Xpress SARS-CoV-2/FLU/RSV testing.  Fact Sheet for Patients: bloggercourse.com  Fact Sheet for Healthcare Providers: seriousbroker.it  This test is not yet approved or cleared by the [place]  FDA and has been authorized for detection and/or diagnosis of SARS-CoV-2 by FDA under an Emergency Use Authorization (EUA). This EUA will remain in effect (meaning this test can be used) for the duration of the COVID-19 declaration under Section 564(b)(1) of the Act, 21 U.S.C. section 360bbb-3(b)(1),  unless the authorization is terminated or revoked.     Resp Syncytial Virus by PCR NEGATIVE NEGATIVE Final    Comment: (NOTE) Fact Sheet for Patients: bloggercourse.com  Fact Sheet for Healthcare Providers: seriousbroker.it  This test is not yet approved or cleared by the [place]  FDA and has been authorized for detection and/or diagnosis of SARS-CoV-2 by FDA under an Emergency Use Authorization (EUA). This EUA will remain in effect (meaning this test can be used) for the duration of the COVID-19 declaration under Section 564(b)(1) of the Act, 21 U.S.C. section 360bbb-3(b)(1), unless the authorization is terminated or revoked.  Performed at [facility], [address] [place], [place] [address]   Blood Culture (routine x 2)     Status: None (Preliminary result)   Collection Time: 07/09/24  6:30 PM   Specimen: BLOOD RIGHT ARM  Result Value Ref Range Status   Specimen Description BLOOD RIGHT ARM  Final   Special Requests   Final    BOTTLES DRAWN AEROBIC AND ANAEROBIC Blood Culture results may not be optimal due to an inadequate volume of blood received in culture bottles   Culture   Final    NO GROWTH 4 DAYS Performed at [facility], [address], [place], [place] [address]    Report Status PENDING  Incomplete  C Difficile Quick Screen w PCR reflex     Status: Abnormal   Collection Time: 07/09/24  7:14 PM   Specimen: STOOL  Result Value Ref Range Status   C Diff antigen POSITIVE (A) NEGATIVE Final   C Diff toxin POSITIVE (A) NEGATIVE Final   C Diff interpretation Toxin producing C. difficile detected.  Final    Comment: RESULT CALLED TO, READ BACK BY AND VERIFIED WITH:  CHARGE NURSE SARAH B. @1434  ON 07/10/24 BY DT Performed at [facility], [address] [place], [place] [address]   Gastrointestinal Panel by PCR , Stool     Status: None   Collection Time: 07/09/24  7:14 PM   Specimen: STOOL   Result Value Ref Range Status   Campylobacter species NOT DETECTED NOT DETECTED Final   Plesimonas shigelloides NOT DETECTED NOT DETECTED Final   Salmonella species NOT DETECTED NOT DETECTED Final   Yersinia enterocolitica NOT DETECTED NOT DETECTED Final   Vibrio species NOT DETECTED NOT DETECTED Final   Vibrio cholerae NOT DETECTED NOT DETECTED Final   Enteroaggregative E coli (EAEC) NOT DETECTED NOT DETECTED Final   Enteropathogenic E coli (EPEC) NOT DETECTED NOT DETECTED Final   Enterotoxigenic E coli (ETEC) NOT DETECTED NOT DETECTED Final   Shiga like toxin producing E coli (STEC) NOT DETECTED NOT DETECTED Final   Shigella/Enteroinvasive E coli (EIEC) NOT DETECTED NOT DETECTED Final   Cryptosporidium NOT DETECTED NOT DETECTED Final   Cyclospora cayetanensis NOT DETECTED NOT DETECTED Final   Entamoeba histolytica NOT DETECTED NOT DETECTED Final   Giardia lamblia NOT DETECTED NOT DETECTED Final   Adenovirus F40/41 NOT DETECTED NOT DETECTED Final   Astrovirus NOT DETECTED NOT DETECTED Final   Norovirus GI/GII NOT DETECTED NOT DETECTED Final   Rotavirus A NOT DETECTED NOT DETECTED Final   Sapovirus (I, II, IV, and V) NOT DETECTED NOT DETECTED Final    Comment: Performed at [facility], [address] [place] [place], [place] [address]     Studies: DG CHEST PORT 1 VIEW Result Date: 07/13/2024 EXAM: 1 VIEW(S) XRAY OF THE CHEST 07/13/2024 02:23:22 AM COMPARISON: 07/09/2024 CLINICAL HISTORY: Respiratory compromise  FINDINGS: LUNGS AND PLEURA: Low lung volumes. Patchy right lower lobe opacity, likely atelectasis. Mild eventration of the right hemidiaphragm. No pleural effusion. No pneumothorax. HEART AND MEDIASTINUM: No acute abnormality of the cardiac and mediastinal silhouettes. BONES AND SOFT TISSUES: Old left rib fractures. Lower thoracic/upper lumbar spine fixation hardware, incompletely visualized. Cholecystectomy clips. IMPRESSION: 1. Patchy right lower lobe opacity, likely  atelectasis. Electronically signed by: Pinkie Pebbles MD 07/13/2024 02:28 AM EST RP Workstation: [id number]      Vernal Alstrom, MD  [facility] 07/13/2024  If 7PM-7AM, please contact night-coverage             "

## 2024-07-13 NOTE — Plan of Care (Signed)
" °  Problem: Fluid Volume: Goal: Hemodynamic stability will improve Outcome: Progressing   Problem: Clinical Measurements: Goal: Diagnostic test results will improve Outcome: Progressing Goal: Signs and symptoms of infection will decrease Outcome: Progressing   Problem: Respiratory: Goal: Ability to maintain adequate ventilation will improve Outcome: Progressing   Problem: Clinical Measurements: Goal: Ability to maintain clinical measurements within normal limits will improve Outcome: Progressing Goal: Will remain free from infection Outcome: Progressing Goal: Diagnostic test results will improve Outcome: Progressing Goal: Respiratory complications will improve Outcome: Progressing Goal: Cardiovascular complication will be avoided Outcome: Progressing   Problem: Activity: Goal: Risk for activity intolerance will decrease Outcome: Progressing   Problem: Nutrition: Goal: Adequate nutrition will be maintained Outcome: Progressing   Problem: Coping: Goal: Level of anxiety will decrease Outcome: Progressing   Problem: Safety: Goal: Ability to remain free from injury will improve Outcome: Progressing   Problem: Skin Integrity: Goal: Risk for impaired skin integrity will decrease Outcome: Progressing   Problem: Elimination: Goal: Will not experience complications related to bowel motility Outcome: Progressing Goal: Will not experience complications related to urinary retention Outcome: Progressing   Problem: Pain Managment: Goal: General experience of comfort will improve and/or be controlled Outcome: Progressing   "

## 2024-07-13 NOTE — Progress Notes (Signed)
"  ° °      Overnight   NAME: Randall Mccormick MRN: [id number] DOB : [birth year]    Date of Service   07/13/2024   HPI/Events of Note    Notified by RN for increasing oxygen demand.  RN notes that patient has not been fully awake or alert since beginning of shift. He currently has increased work of breathing, oxygen titrated up to 4L, sats no more than 92%. RT is giving nebulizer treatment, high flow oxygen up to 10 L/min. ABG is ordered-pending. Portable chest x-ray ordered-pending,    Interventions/ Plan   O2 as needed to maintain sats greater than 90% ABG-pending Chest x-ray- pending         Update 0213 hours  Latest Reference Range & Units 07/13/24 01:38  Sample type  ARTERIAL  pH, Arterial 7.35 - 7.45  7.326 (L)  pCO2 arterial 32 - 48 mmHg 44.7  pO2, Arterial 83 - 108 mmHg 62 (L)  TCO2 22 - 32 mmol/L 24  Acid-base deficit 0.0 - 2.0 mmol/L 3.0 (H)  Bicarbonate 20.0 - 28.0 mmol/L 23.1  O2 Saturation % 88  Patient temperature  37.9 C  (L): Data is abnormally low (H): Data is abnormally high    Lynwood Kipper BSN MSNA MSN ACNPC-AG Acute Care Nurse Practitioner [facility] [place]  "

## 2024-07-13 NOTE — Progress Notes (Signed)
 PT Cancellation Note  Patient Details Name: Randall Mccormick MRN: [id number] DOB: 10/10/1955   Cancelled Treatment:    Reason Eval/Treat Not Completed: Medical issues which prohibited therapy. Pt unarousable to verbal or tactile stimuli at this time and unable to participate in PT eval. Acute PT to return as able, as appropriate to complete PT eval.  Granvel Proudfoot, PT, DPT Acute Rehabilitation Services Secure chat preferred Office #: [phone]    [name] Ames 07/13/2024, 9:37 AM

## 2024-07-13 NOTE — Progress Notes (Signed)
 Patient has not been awake or alert through the shift. He is currently on Bipap per provider's order due to increased work of breathing.

## 2024-07-13 NOTE — Progress Notes (Signed)
 CPT held at this time. Patient still on bipap. ABG drawn and results given to MD.

## 2024-07-13 NOTE — Procedures (Addendum)
 Patient Name: Randall Mccormick  MRN: [id number]  Epilepsy Attending: Arlin MALVA Krebs  Referring Physician/Provider: Sonjia Held, MD  Date: 07/13/2024 Duration: 22.55 mins  Patient history: 70yo M with ams. EEG to evaluate for seizure  Level of alertness: Asleep/ lethargic  AEDs during EEG study: GBP  Technical aspects: This EEG study was done with scalp electrodes positioned according to the 10-20 International system of electrode placement. Electrical activity was reviewed with band pass filter of 1-70Hz , sensitivity of 7 uV/mm, display speed of 42mm/sec with a 60Hz  notched filter applied as appropriate. EEG data were recorded continuously and digitally stored.  Video monitoring was available and reviewed as appropriate.  Description: EEG showed continuous generalized 3-6hz  theta-delta slowing. Sleep was characterized by sleep spindles (12 to 14 Hz), maximal frontocentral region. Hyperventilation and photic stimulation were not performed.     IMPRESSION: This study is suggestive of generalized cerebral dysfunction ( encephalopathy). No seizures or epileptiform discharges were seen throughout the recording.  Alia Parsley O Eugina Row

## 2024-07-13 NOTE — Progress Notes (Signed)
 RT placed PT on SALTER @ 10L.

## 2024-07-13 NOTE — Progress Notes (Signed)
 Patient was transported on the Bipap from 4NP09 to CT & back with no problems.

## 2024-07-13 NOTE — Consult Note (Addendum)
 "  NAME:  Randall Mccormick, MRN:  [id number], DOB:  08/08/1955, LOS: 4 ADMISSION DATE:  07/09/2024, CONSULTATION DATE:  07/13/24 REFERRING MD:  Dr. Sonjia CHIEF COMPLAINT:  Respiratory Distress   History of Present Illness:  Patient is a Randall Mccormick is a 69 y.o. 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  In the ED, patient was tachycardic with fever.  Labs showed creatinine of 1.0.  Lactate of 2.3.  Total bilirubin of 2.6.  COVID influenza and RSV was negative.  Chest x-ray showed cardiomegaly.  CT scan of the abdomen pelvis showed fluid throughout the colon consistent with diarrhea/enteritis.   Pt was not awake or alert this AM. He had increased work of breathing for which he was placed on Bipap. Initial CXR on admission showed hypo ventilatory changes with cardiomegaly. Repeat CXR from this AM showed right lower lobe opacity, likely due to atelectasis. Mild decrease in pO2 on stat ABG from this AM. PCCM consulted for further evaluation   Pertinent  Medical History  Per above   Significant Hospital Events: Including procedures, antibiotic start and stop dates in addition to other pertinent events   1/16: admitted for Sepsis 2/2 to enteritis  1/20: PCCM consulted for Respiratory distress and somnolence   Interim History / Subjective:  Saw pt at bedside. On bipap. Not following commands.   Objective    Blood pressure 105/69, pulse (!) 112, temperature (!) 100.5 F (38.1 C), temperature source Axillary, resp. rate (!) 25, SpO2 99%.    FiO2 (%):  [60 %] 60 % PEEP:  [5 cmH20] 5 cmH20   Intake/Output Summary (Last 24 hours) at 07/13/2024 0933 Last data filed at 07/13/2024 9187 Gross per 24 hour  Intake 3 ml  Output 1320 ml  Net -1317 ml   There were no vitals filed for this  visit.  Examination: General: ill-appearing male, on bipap HENT: [place]/AT, PERRL Lungs: Reduces breath sounds BL  Cardiovascular: Mild tachycardia  Abdomen: Distended but soft Extremities: +1 Mild BL pitting edema in LE  Neuro: not alert, somnolent, Doesn't respond to tactile or verbal stimuli, doesn't follow commands,   Resolved problem list   Assessment and Plan    Respiratory distress Pt was found to be have increased work of breathing with increased O2 requirements this AM. He had worsening of mentation and was not awake or alert. He was transitioned from [place] to Bipap this AM. Pt was somnolent throughout our evaluation. He was satting well on Bipap with appropriate tidal volume. No significant changes in STAT-ABG. No emergent need for intubation at present. If condition worsens, will likely intubate. Will reassess in PM to evaluate the need for intubation or airway watch.   -Continue on Bipap -Will re-evaluate in PM and determine need for ICU transfer  Volume status assessment  Although mild edema noted in LE, pt appears dry on exam, concerning for intravascular depletion. Fluids initiated. -LR infusion   Severe sepsis due to C. difficile enteritis, POA Hyperbilirubinemia Hypertension Hyperlipidemia Incomplete paraplegia with neurogenic bladder: Depression/anxiety Chronic pain Gout  Tx, per primary team     Labs   CBC: Recent Labs  Lab 07/09/24 1830 07/09/24 1845 07/10/24 0406 07/11/24 0244 07/12/24 0816 07/13/24 0138 07/13/24 0526 07/13/24 0832  WBC 21.7*  --  21.2* 25.1* 27.2*  --  23.8*  --   NEUTROABS 18.0*  --   --   --   --   --   --   --  HGB 16.4   < > 16.4 17.6* 17.9* 18.4* 17.3* 18.4*  HCT 51.1   < > 50.9 52.5* 54.3* 54.0* 52.2* 54.0*  MCV 100.4*  --  100.6* 96.9 97.7  --  96.5  --   PLT 227  --  183 240 238  --  251  --    < > = values in this interval not displayed.    Basic Metabolic Panel: Recent Labs  Lab 07/09/24 1826 07/09/24 1845  07/10/24 0406 07/11/24 0244 07/12/24 0816 07/13/24 0138 07/13/24 0526 07/13/24 0832  NA 137 139 137 133* 131* 130* 132* 133*  K 4.0 3.8 3.8 4.6 5.1 4.8 5.0 5.0  CL 99 99 101 102 98  --  99  --   CO2 26  --  25 20* 22  --  21*  --   GLUCOSE 113* 116* 118* 144* 130*  --  125*  --   BUN 14 15 16  25* 46*  --  65*  --   CREATININE 1.07 1.00 0.94 1.33* 1.95*  --  2.44*  --   CALCIUM  8.5*  --  8.4* 8.3* 8.0*  --  7.7*  --   MG  --   --  2.2  --   --   --  3.0*  --    GFR: CrCl cannot be calculated (Unknown ideal weight.). Recent Labs  Lab 07/09/24 1846 07/09/24 2158 07/10/24 0406 07/11/24 0244 07/12/24 0816 07/13/24 0526  WBC  --   --  21.2* 25.1* 27.2* 23.8*  LATICACIDVEN 2.3* 1.0  --   --   --   --     Liver Function Tests: Recent Labs  Lab 07/09/24 1826 07/10/24 0406 07/11/24 0244 07/12/24 0816  AST 48* 37 30 41  ALT 36 32 21 25  ALKPHOS 241* 203* 165* 168*  BILITOT 2.6* 3.0* 1.5* 0.8  PROT 7.0 6.0* 5.3* 5.3*  ALBUMIN 3.5 3.0* 2.2* 2.2*   No results for input(s): LIPASE, AMYLASE in the last 168 hours. No results for input(s): AMMONIA in the last 168 hours.  ABG    Component Value Date/Time   PHART 7.335 (L) 07/13/2024 0832   PCO2ART 42.9 07/13/2024 0832   PO2ART 81 (L) 07/13/2024 0832   HCO3 22.7 07/13/2024 0832   TCO2 24 07/13/2024 0832   ACIDBASEDEF 3.0 (H) 07/13/2024 0832   O2SAT 94 07/13/2024 0832     Coagulation Profile: Recent Labs  Lab 07/10/24 0406  INR 1.5*    Cardiac Enzymes: No results for input(s): CKTOTAL, CKMB, CKMBINDEX, TROPONINI in the last 168 hours.  HbA1C: Hgb A1c MFr Bld  Date/Time Value Ref Range Status  09/11/2023 03:44 AM 5.3 4.8 - 5.6 % Final    Comment:    (NOTE) Pre diabetes:          5.7%-6.4%  Diabetes:              >6.4%  Glycemic control for   <7.0% adults with diabetes   04/06/2022 04:58 PM 5.3 4.8 - 5.6 % Final    Comment:    (NOTE) Pre diabetes:          5.7%-6.4%  Diabetes:               >6.4%  Glycemic control for   <7.0% adults with diabetes     CBG: No results for input(s): GLUCAP in the last 168 hours.  Review of Systems:   Per above   Past Medical History:  He,  has  a past medical history of Abnormal gait due to muscle weakness (03/28/2021), Acute blood loss anemia, Acute encephalopathy (04/05/2022), Acute on chronic anemia (04/07/2022), Acute respiratory failure with hypoxia (HCC) (04/07/2022), AKI (acute kidney injury), Anxiety, Arthritis of knee (03/28/2022), 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) (03/29/2022), Aspiration into airway (05/17/2024), Aspiration pneumonia (HCC) (04/12/2022), BMI 35.0-35.9,adult (03/02/2014), C/O Cirrhosis. Hx of daily Alcohol consumption (05/15/2024), Chronic pain (12/24/2023), Chronic pain syndrome, Cobalamin deficiency, Constipation, Cord compression (HCC) (07/20/2020), Degeneration of lumbar or lumbosacral intervertebral disc, Degenerative lumbar spinal stenosis (11/23/2019), Depression, Difficulty walking (12/11/2022), Disorder of spinal region [facility]) (12/11/2022), Displacement of lumbar intervertebral disc without myelopathy (03/02/2014), Dupuytren's contracture of right hand (03/17/2019), Esophageal dysphagia (05/17/2024), Foot-drop (12/11/2022), Gouty arthropathy, Hematemesis with nausea, Hemorrhoids, Hyperkalemia, Hyperlipidemia, Hypertension, Hypoalbuminemia due to protein-calorie malnutrition, Incomplete paraplegia (HCC) (09/08/2020), Low back pain (05/12/2023), Lumbago with sciatica, left side (03/22/2024), Lumbago with sciatica, right side (03/22/2024), Lumbar spondylosis (11/03/2013), Mitral regurgitation (03/23/2024), Moderate alcohol consumption (03/23/2024), Multiple fractures of ribs of left side (04/20/2013), Muscle atrophy (12/11/2022), Neurogenic bladder, Neurogenic bowel, Neuropathic pain, OA (osteoarthritis) of knee (03/28/2022), Osteoarthritis of right hip (01/07/2014), 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   Surgical History:   Past Surgical History:  Procedure Laterality Date   APPENDECTOMY     BACK SURGERY     x 6   CHOLECYSTECTOMY  06/24/2008   [facility]   ESOPHAGOGASTRODUODENOSCOPY N/A 04/06/2022   Procedure: ESOPHAGOGASTRODUODENOSCOPY (EGD);  Surgeon: Legrand Victory LITTIE DOUGLAS, MD;  Location: [facility] ENDOSCOPY;  Service: Gastroenterology;  Laterality: N/A;   FOOT SURGERY Right 05/2024   TONSILLECTOMY     TOTAL KNEE ARTHROPLASTY Right 03/28/2022   Procedure: RIGHT TOTAL KNEE ARTHROPLASTY;  Surgeon: Addie Cordella Hamilton, MD;  Location: [facility] OR;  Service: Orthopedics;  Laterality: Right;     Social History:   reports that he has never smoked. He quit smokeless tobacco use about 4 years ago.  His smokeless tobacco use included chew. He reports current alcohol use of about 1.0 standard drink of alcohol per week. He reports that he does not currently use drugs.   Family History:  His family history includes Alcoholism in his father; Cancer in his mother and son; Heart disease in his father.   Allergies Allergies[1]   Home Medications  Prior to Admission medications  Medication Sig Start Date End Date Taking? Authorizing Provider  albuterol  (PROVENTIL ) (2.5 MG/3ML) 0.083% nebulizer solution Take 2.5 mg by nebulization every 6 (six) hours as needed for wheezing or shortness of breath.   Yes [provider]  allopurinol   (ZYLOPRIM ) 300 MG tablet Take 300 mg by mouth daily. 05/12/24  Yes [provider]  ALPRAZolam  (XANAX ) 1 MG tablet Take 1 mg by mouth 3 (three) times daily as needed for anxiety.   Yes [provider]  amLODipine  (NORVASC ) 2.5 MG tablet Take 2.5 mg by mouth daily.   Yes [provider]  aspirin  EC 81 MG tablet Take 81 mg by mouth daily. Swallow whole.   Yes [provider]  baclofen  (LIORESAL ) 10 MG tablet Take 5 mg by mouth daily. 06/12/24  Yes [provider]  ezetimibe  (ZETIA )  10 MG tablet Take 10 mg by mouth at bedtime. 04/29/20  Yes [provider]  FLUoxetine  (PROZAC ) 40 MG capsule Take 40 mg by mouth daily. 03/22/24  Yes [provider]  fluticasone (FLONASE) 50 MCG/ACT nasal spray Place 1 spray into both nostrils 2 (two) times daily as needed for allergies or rhinitis.   Yes [provider]  folic acid  (FOLVITE ) 1 MG tablet Take 1 mg by mouth daily. 07/29/23  Yes [provider]  gabapentin  (NEURONTIN ) 300 MG capsule Take 300 mg by mouth 2 (two) times daily. 04/13/24  Yes [provider]  OLANZapine  (ZYPREXA ) 2.5 MG tablet Take 2.5 mg by mouth at bedtime.   Yes [provider]  oxyCODONE  (ROXICODONE ) 15 MG immediate release tablet Take 15 mg by mouth every 8 (eight) hours as needed for pain. 04/07/24  Yes [provider]  pantoprazole  (PROTONIX ) 40 MG tablet Take 40 mg by mouth daily. 08/25/23  Yes [provider]  polyethylene glycol (MIRALAX  / GLYCOLAX ) 17 g packet Take 17 g by mouth 3 (three) times daily. Patient taking differently: Take 17 g by mouth daily. May take 1 additional tablet as needed for constipation 12/27/23  Yes Ghimire, Donalda HERO, MD  Prenatal Vit-Fe Fumarate-FA (PRENATAL VITAMIN PO) Take 1 tablet by mouth daily.   Yes [provider]  rosuvastatin  (CRESTOR ) 5 MG tablet Take 1 tablet (5 mg total) by mouth daily. 03/23/24 07/09/25 Yes Madireddy, Alean SAUNDERS, MD   solifenacin (VESICARE) 10 MG tablet Take 10 mg by mouth daily. 07/01/24  Yes [provider]  thiamine  (VITAMIN B1) 100 MG tablet Take 100 mg by mouth daily.   Yes [provider]  VENTOLIN  HFA 108 (90 Base) MCG/ACT inhaler Inhale 1-2 puffs into the lungs every 4 (four) hours as needed for wheezing or shortness of breath. 08/22/23  Yes [provider]  tamsulosin  (FLOMAX ) 0.4 MG CAPS capsule Take 1 capsule (0.4 mg total) by mouth daily after supper. Patient not taking: Reported on 07/09/2024 12/27/23   Raenelle Donalda HERO, MD     Critical care time: 50     Rebecka Pion, DO IM PGY-1          [1] No Known Allergies  "

## 2024-07-13 NOTE — Progress Notes (Signed)
 EEG complete - results pending

## 2024-07-14 ENCOUNTER — Inpatient Hospital Stay (HOSPITAL_COMMUNITY): Payer: MEDICARE

## 2024-07-14 DIAGNOSIS — R6520 Severe sepsis without septic shock: Secondary | ICD-10-CM | POA: Diagnosis not present

## 2024-07-14 DIAGNOSIS — G9341 Metabolic encephalopathy: Secondary | ICD-10-CM

## 2024-07-14 DIAGNOSIS — A419 Sepsis, unspecified organism: Secondary | ICD-10-CM | POA: Diagnosis not present

## 2024-07-14 DIAGNOSIS — A09 Infectious gastroenteritis and colitis, unspecified: Secondary | ICD-10-CM

## 2024-07-14 DIAGNOSIS — A0472 Enterocolitis due to Clostridium difficile, not specified as recurrent: Secondary | ICD-10-CM | POA: Diagnosis not present

## 2024-07-14 DIAGNOSIS — F32A Depression, unspecified: Secondary | ICD-10-CM | POA: Diagnosis not present

## 2024-07-14 DIAGNOSIS — I10 Essential (primary) hypertension: Secondary | ICD-10-CM | POA: Diagnosis not present

## 2024-07-14 DIAGNOSIS — E785 Hyperlipidemia, unspecified: Secondary | ICD-10-CM | POA: Diagnosis not present

## 2024-07-14 DIAGNOSIS — F419 Anxiety disorder, unspecified: Secondary | ICD-10-CM | POA: Diagnosis not present

## 2024-07-14 LAB — POCT I-STAT 7, (LYTES, BLD GAS, ICA,H+H)
Acid-base deficit: 3 mmol/L — ABNORMAL HIGH (ref 0.0–2.0)
Acid-base deficit: 4 mmol/L — ABNORMAL HIGH (ref 0.0–2.0)
Bicarbonate: 26.1 mmol/L (ref 20.0–28.0)
Bicarbonate: 23.9 mmol/L (ref 20.0–28.0)
Calcium, Ion: 1.06 mmol/L — ABNORMAL LOW (ref 1.15–1.40)
Calcium, Ion: 1.07 mmol/L — ABNORMAL LOW (ref 1.15–1.40)
HCT: 50 % (ref 39.0–52.0)
HCT: 49 % (ref 39.0–52.0)
Hemoglobin: 17 g/dL (ref 13.0–17.0)
Hemoglobin: 16.7 g/dL (ref 13.0–17.0)
O2 Saturation: 83 %
O2 Saturation: 94 %
Patient temperature: 36.6
Patient temperature: 98.3
Potassium: 4.5 mmol/L (ref 3.5–5.1)
Potassium: 4.7 mmol/L (ref 3.5–5.1)
Sodium: 131 mmol/L — ABNORMAL LOW (ref 135–145)
Sodium: 137 mmol/L (ref 135–145)
TCO2: 28 mmol/L (ref 22–32)
TCO2: 25 mmol/L (ref 22–32)
pCO2 arterial: 60.3 mmHg — ABNORMAL HIGH (ref 32–48)
pCO2 arterial: 52.8 mmHg — ABNORMAL HIGH (ref 32–48)
pH, Arterial: 7.243 — ABNORMAL LOW (ref 7.35–7.45)
pH, Arterial: 7.263 — ABNORMAL LOW (ref 7.35–7.45)
pO2, Arterial: 56 mmHg — ABNORMAL LOW (ref 83–108)
pO2, Arterial: 81 mmHg — ABNORMAL LOW (ref 83–108)

## 2024-07-14 LAB — COMPREHENSIVE METABOLIC PANEL WITH GFR
ALT: 34 U/L (ref 0–44)
ALT: 35 U/L (ref 0–44)
AST: 78 U/L — ABNORMAL HIGH (ref 15–41)
AST: 80 U/L — ABNORMAL HIGH (ref 15–41)
Albumin: 2.1 g/dL — ABNORMAL LOW (ref 3.5–5.0)
Albumin: 2.1 g/dL — ABNORMAL LOW (ref 3.5–5.0)
Alkaline Phosphatase: 122 U/L (ref 38–126)
Alkaline Phosphatase: 122 U/L (ref 38–126)
Anion gap: 13 (ref 5–15)
Anion gap: 14 (ref 5–15)
BUN: 68 mg/dL — ABNORMAL HIGH (ref 8–23)
BUN: 68 mg/dL — ABNORMAL HIGH (ref 8–23)
CO2: 20 mmol/L — ABNORMAL LOW (ref 22–32)
CO2: 21 mmol/L — ABNORMAL LOW (ref 22–32)
Calcium: 7.3 mg/dL — ABNORMAL LOW (ref 8.9–10.3)
Calcium: 7.1 mg/dL — ABNORMAL LOW (ref 8.9–10.3)
Chloride: 103 mmol/L (ref 98–111)
Chloride: 102 mmol/L (ref 98–111)
Creatinine, Ser: 2.15 mg/dL — ABNORMAL HIGH (ref 0.61–1.24)
Creatinine, Ser: 1.98 mg/dL — ABNORMAL HIGH (ref 0.61–1.24)
GFR, Estimated: 33 mL/min — ABNORMAL LOW
GFR, Estimated: 36 mL/min — ABNORMAL LOW
Glucose, Bld: 95 mg/dL (ref 70–99)
Glucose, Bld: 90 mg/dL (ref 70–99)
Potassium: 6 mmol/L — ABNORMAL HIGH (ref 3.5–5.1)
Potassium: 4.4 mmol/L (ref 3.5–5.1)
Sodium: 136 mmol/L (ref 135–145)
Sodium: 137 mmol/L (ref 135–145)
Total Bilirubin: 0.8 mg/dL (ref 0.0–1.2)
Total Bilirubin: 0.7 mg/dL (ref 0.0–1.2)
Total Protein: 5 g/dL — ABNORMAL LOW (ref 6.5–8.1)
Total Protein: 4.8 g/dL — ABNORMAL LOW (ref 6.5–8.1)

## 2024-07-14 LAB — CBC
HCT: 45.7 % (ref 39.0–52.0)
Hemoglobin: 15.1 g/dL (ref 13.0–17.0)
MCH: 32.5 pg (ref 26.0–34.0)
MCHC: 33 g/dL (ref 30.0–36.0)
MCV: 98.3 fL (ref 80.0–100.0)
Platelets: 209 K/uL (ref 150–400)
RBC: 4.65 MIL/uL (ref 4.22–5.81)
RDW: 17.4 % — ABNORMAL HIGH (ref 11.5–15.5)
WBC: 19.1 K/uL — ABNORMAL HIGH (ref 4.0–10.5)
nRBC: 0 % (ref 0.0–0.2)

## 2024-07-14 LAB — CULTURE, BLOOD (ROUTINE X 2)
Culture: NO GROWTH
Culture: NO GROWTH

## 2024-07-14 LAB — MRSA NEXT GEN BY PCR, NASAL: MRSA by PCR Next Gen: NOT DETECTED

## 2024-07-14 LAB — GLUCOSE, CAPILLARY
Glucose-Capillary: 103 mg/dL — ABNORMAL HIGH (ref 70–99)
Glucose-Capillary: 91 mg/dL (ref 70–99)
Glucose-Capillary: 86 mg/dL (ref 70–99)
Glucose-Capillary: 110 mg/dL — ABNORMAL HIGH (ref 70–99)
Glucose-Capillary: 139 mg/dL — ABNORMAL HIGH (ref 70–99)

## 2024-07-14 LAB — MAGNESIUM: Magnesium: 2.8 mg/dL — ABNORMAL HIGH (ref 1.7–2.4)

## 2024-07-14 LAB — PHOSPHORUS: Phosphorus: 3.8 mg/dL (ref 2.5–4.6)

## 2024-07-14 LAB — LACTIC ACID, PLASMA
Lactic Acid, Venous: 1.4 mmol/L (ref 0.5–1.9)
Lactic Acid, Venous: 1.3 mmol/L (ref 0.5–1.9)

## 2024-07-14 MED ORDER — VANCOMYCIN 50 MG/ML ORAL SOLUTION
125.0000 mg | Freq: Four times a day (QID) | ORAL | Status: DC
  Administered 2024-07-14 – 2024-07-16 (×8): 125 mg
  Filled 2024-07-14 (×9): qty 2.5

## 2024-07-14 MED ORDER — FAMOTIDINE 20 MG PO TABS
20.0000 mg | ORAL_TABLET | Freq: Every day | ORAL | Status: DC
  Administered 2024-07-14 – 2024-07-15 (×2): 20 mg
  Filled 2024-07-14 (×2): qty 1

## 2024-07-14 MED ORDER — MIDAZOLAM HCL 2 MG/2ML IJ SOLN
INTRAMUSCULAR | Status: AC
  Filled 2024-07-14: qty 2

## 2024-07-14 MED ORDER — FLUOXETINE HCL 20 MG PO CAPS: 40.0000 mg | ORAL_CAPSULE | Freq: Every day | ORAL | Status: DC

## 2024-07-14 MED ORDER — ROCURONIUM BROMIDE 10 MG/ML (PF) SYRINGE
PREFILLED_SYRINGE | INTRAVENOUS | Status: AC
  Administered 2024-07-14: 50 mg
  Filled 2024-07-14: qty 10

## 2024-07-14 MED ORDER — VANCOMYCIN HCL 2000 MG/400ML IV SOLN
2000.0000 mg | Freq: Once | INTRAVENOUS | Status: AC
  Administered 2024-07-14: 2000 mg via INTRAVENOUS
  Filled 2024-07-14: qty 400

## 2024-07-14 MED ORDER — ETOMIDATE 2 MG/ML IV SOLN
INTRAVENOUS | Status: AC
  Administered 2024-07-14: 20 mg via INTRAVENOUS
  Filled 2024-07-14: qty 20

## 2024-07-14 MED ORDER — PROSOURCE TF20 ENFIT COMPATIBL EN LIQD
60.0000 mL | Freq: Every day | ENTERAL | Status: DC
  Administered 2024-07-14: 60 mL
  Administered 2024-07-14: 30 mL
  Administered 2024-07-15 – 2024-07-24 (×10): 60 mL
  Filled 2024-07-14 (×12): qty 60

## 2024-07-14 MED ORDER — CHLORHEXIDINE GLUCONATE CLOTH 2 % EX PADS
6.0000 | MEDICATED_PAD | Freq: Every day | CUTANEOUS | Status: DC
  Administered 2024-07-14 – 2024-07-21 (×8): 6 via TOPICAL

## 2024-07-14 MED ORDER — ORAL CARE MOUTH RINSE: 15.0000 mL | OROMUCOSAL | Status: DC | PRN

## 2024-07-14 MED ORDER — PIPERACILLIN-TAZOBACTAM 3.375 G IVPB
3.3750 g | Freq: Three times a day (TID) | INTRAVENOUS | Status: DC
  Filled 2024-07-14: qty 50

## 2024-07-14 MED ORDER — EZETIMIBE 10 MG PO TABS
10.0000 mg | ORAL_TABLET | Freq: Every day | ORAL | Status: DC
  Administered 2024-07-14 – 2024-07-23 (×10): 10 mg
  Filled 2024-07-14 (×11): qty 1

## 2024-07-14 MED ORDER — ROSUVASTATIN CALCIUM 5 MG PO TABS
5.0000 mg | ORAL_TABLET | Freq: Every day | ORAL | Status: DC
  Administered 2024-07-14 – 2024-07-24 (×11): 5 mg
  Filled 2024-07-14 (×12): qty 1

## 2024-07-14 MED ORDER — FAMOTIDINE 20 MG PO TABS: 20.0000 mg | ORAL_TABLET | Freq: Every day | ORAL | Status: DC

## 2024-07-14 MED ORDER — PROPOFOL 1000 MG/100ML IV EMUL
0.0000 ug/kg/min | INTRAVENOUS | Status: DC
  Administered 2024-07-14: 10 ug/kg/min via INTRAVENOUS
  Filled 2024-07-14: qty 100

## 2024-07-14 MED ORDER — FENTANYL 2500MCG IN NS 250ML (10MCG/ML) PREMIX INFUSION
0.0000 ug/h | INTRAVENOUS | Status: DC
  Administered 2024-07-14: 50 ug/h via INTRAVENOUS
  Administered 2024-07-16: 25 ug/h via INTRAVENOUS
  Filled 2024-07-14 (×3): qty 250

## 2024-07-14 MED ORDER — GABAPENTIN 300 MG PO CAPS: 300.0000 mg | ORAL_CAPSULE | Freq: Two times a day (BID) | ORAL | Status: DC

## 2024-07-14 MED ORDER — THIAMINE MONONITRATE 100 MG PO TABS
100.0000 mg | ORAL_TABLET | Freq: Every day | ORAL | Status: DC
  Administered 2024-07-14 – 2024-07-15 (×2): 100 mg
  Filled 2024-07-14 (×2): qty 1

## 2024-07-14 MED ORDER — BACLOFEN 10 MG PO TABS
5.0000 mg | ORAL_TABLET | Freq: Every day | ORAL | Status: DC
  Administered 2024-07-14: 5 mg
  Filled 2024-07-14: qty 1

## 2024-07-14 MED ORDER — ALLOPURINOL 300 MG PO TABS: 300.0000 mg | ORAL_TABLET | Freq: Every day | ORAL | Status: DC

## 2024-07-14 MED ORDER — FENTANYL CITRATE (PF) 50 MCG/ML IJ SOSY
PREFILLED_SYRINGE | INTRAMUSCULAR | Status: AC
  Administered 2024-07-14: 50 ug via INTRAVENOUS
  Filled 2024-07-14: qty 2

## 2024-07-14 MED ORDER — NOREPINEPHRINE 4 MG/250ML-% IV SOLN
0.0000 ug/min | INTRAVENOUS | Status: DC
  Administered 2024-07-14: 2 ug/min via INTRAVENOUS
  Administered 2024-07-15: 3 ug/min via INTRAVENOUS
  Administered 2024-07-17: 5 ug/min via INTRAVENOUS
  Filled 2024-07-14 (×3): qty 250

## 2024-07-14 MED ORDER — BACLOFEN 10 MG PO TABS: 5.0000 mg | ORAL_TABLET | Freq: Every day | ORAL | Status: DC

## 2024-07-14 MED ORDER — KETAMINE HCL 50 MG/5ML IJ SOSY
PREFILLED_SYRINGE | INTRAMUSCULAR | Status: AC
  Filled 2024-07-14: qty 10

## 2024-07-14 MED ORDER — ETOMIDATE 2 MG/ML IV SOLN
20.0000 mg | Freq: Once | INTRAVENOUS | Status: AC
  Filled 2024-07-14: qty 10

## 2024-07-14 MED ORDER — CALCIUM GLUCONATE-NACL 1-0.675 GM/50ML-% IV SOLN
1.0000 g | Freq: Once | INTRAVENOUS | Status: AC
  Administered 2024-07-14: 1000 mg via INTRAVENOUS
  Filled 2024-07-14: qty 50

## 2024-07-14 MED ORDER — VITAL 1.5 CAL PO LIQD
1000.0000 mL | ORAL | Status: AC
  Administered 2024-07-14 – 2024-07-21 (×8): 1000 mL
  Filled 2024-07-14 (×8): qty 1000

## 2024-07-14 MED ORDER — SUCCINYLCHOLINE CHLORIDE 200 MG/10ML IV SOSY
PREFILLED_SYRINGE | INTRAVENOUS | Status: AC
  Filled 2024-07-14: qty 10

## 2024-07-14 MED ORDER — FENTANYL CITRATE (PF) 50 MCG/ML IJ SOSY
50.0000 ug | PREFILLED_SYRINGE | Freq: Once | INTRAMUSCULAR | Status: AC
  Filled 2024-07-14: qty 1

## 2024-07-14 MED ORDER — OLANZAPINE 2.5 MG PO TABS
2.5000 mg | ORAL_TABLET | Freq: Every day | ORAL | Status: DC
  Administered 2024-07-14 – 2024-07-18 (×5): 2.5 mg
  Filled 2024-07-14 (×5): qty 1

## 2024-07-14 MED ORDER — VANCOMYCIN 50 MG/ML ORAL SOLUTION
125.0000 mg | Freq: Four times a day (QID) | ORAL | Status: DC
  Administered 2024-07-14: 125 mg via ORAL
  Filled 2024-07-14 (×4): qty 2.5

## 2024-07-14 MED ORDER — FLUOXETINE HCL 20 MG PO CAPS
40.0000 mg | ORAL_CAPSULE | Freq: Every day | ORAL | Status: DC
  Administered 2024-07-14 – 2024-07-24 (×11): 40 mg
  Filled 2024-07-14 (×4): qty 2
  Filled 2024-07-14: qty 4
  Filled 2024-07-14: qty 2
  Filled 2024-07-14 (×5): qty 4
  Filled 2024-07-14 (×3): qty 2
  Filled 2024-07-14: qty 4
  Filled 2024-07-14 (×3): qty 2

## 2024-07-14 MED ORDER — PIPERACILLIN-TAZOBACTAM 3.375 G IVPB 30 MIN
3.3750 g | Freq: Once | INTRAVENOUS | Status: AC
  Administered 2024-07-14: 3.375 g via INTRAVENOUS
  Filled 2024-07-14: qty 50

## 2024-07-14 MED ORDER — ALLOPURINOL 300 MG PO TABS
300.0000 mg | ORAL_TABLET | Freq: Every day | ORAL | Status: DC
  Administered 2024-07-14 – 2024-07-24 (×11): 300 mg
  Filled 2024-07-14 (×12): qty 1

## 2024-07-14 MED ORDER — ORAL CARE MOUTH RINSE
15.0000 mL | OROMUCOSAL | Status: DC
  Administered 2024-07-14 – 2024-07-21 (×86): 15 mL via OROMUCOSAL

## 2024-07-14 MED ORDER — PHENYLEPHRINE 80 MCG/ML (10ML) SYRINGE FOR IV PUSH (FOR BLOOD PRESSURE SUPPORT)
PREFILLED_SYRINGE | INTRAVENOUS | Status: AC
  Filled 2024-07-14: qty 10

## 2024-07-14 NOTE — Progress Notes (Signed)
 PT intubated by CCM. 7.5 - 24 @ lip. RT will obtain ABG in one hour.

## 2024-07-14 NOTE — Plan of Care (Addendum)
 20 mg of Etomidate  and 50 mcg pre-filled syringe pulled from pyxis, drawn up, and left with Rapid Response RN, Alm Burner. Pt awaiting transfer to Medical ICU for higher level of care.

## 2024-07-14 NOTE — H&P (Addendum)
 "  NAME:  Randall Mccormick, MRN:  [id number], DOB:  1955/08/25, LOS: 5 ADMISSION DATE:  07/09/2024, CONSULTATION DATE:  07/14/24 REFERRING MD: None , CHIEF COMPLAINT:  Respiratory Decline   History of Present Illness:  Patient is a Randall Mccormick is a 69 y.o. 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  In the ED, patient was tachycardic with fever.  Labs showed creatinine of 1.0.  Lactate of 2.3.  Total bilirubin of 2.6.  COVID influenza and RSV was negative.  Chest x-ray showed cardiomegaly.  CT scan of the abdomen pelvis showed fluid throughout the colon consistent with diarrhea/enteritis. He was tested positive for C. Diff and was being treated with PO vancomycin .  On 1/20 PCCM was consulted to evaluate the pt for not being responsive and having increased WOB. Pt was transitioned from [place] to Bipap. PCCM recommended pt being monitored on the floor as he was tolerating bipap well and was maintaining his airway, with low threshold to move to ICU if need be.   On 1/21 pt had worsening respiratory status and and abnormal ABG requiring intubation. Repeat ABG showed an improvement in Pco2, and O2 levels.  Pertinent  Medical History  Per above   Significant Hospital Events: Including procedures, antibiotic start and stop dates in addition to other pertinent events   1/16: admitted  1/20: Became less responsive with increased WOB 1/21: Intubated and admitted to ICU for decline in respiratory status   Interim History / Subjective:  Saw pt at bedside. Sedated and not following commands.   Objective    Blood pressure 113/72, pulse 86, temperature 97.6 F (36.4 C), temperature source Oral, resp. rate (!) 26, height 5' 8 (1.727 m), weight 99.8 kg, SpO2 96%.    FiO2 (%):  [50 %-60 %] 60 % Set  Rate:  [24 bmp-26 bmp] 26 bmp Vt Set:  [550 mL] 550 mL PEEP:  [5 cmH20-8 cmH20] 8 cmH20 Plateau Pressure:  [16 cmH20] 16 cmH20   Intake/Output Summary (Last 24 hours) at 07/14/2024 9160 Last data filed at 07/14/2024 0700 Gross per 24 hour  Intake 153 ml  Output 650 ml  Net -497 ml   Filed Weights   07/14/24 0500  Weight: 99.8 kg    Examination: General: sedated, not following commands  HENT: [place]/ AT, PER sluggishly reactive to light  Lungs: Coarse breath sounds  Cardiovascular: RRR Abdomen: distended but soft  Extremities: +1 BL LE pitting edema  Neuro: sedated, not following commands   Resolved problem list   Assessment and Plan   Metabolic Encephalopathy  Respiratory decline PCCM consulted yesterday 1/20 after pt became less responsive and required Bipap. Upon reevaluation in PM on 1/20, pt was protecting airway and was a bit more responsive to localized pain. Monitored pt on the floor with low threshold to move to ICU if need be. Pt respiratory status declined and he was intubated on 1/21.   No electrolyte derangements this AM. Ammonia levels were wnl. CT head was negative for acute changes. EEG negative for seizures but suggested generalized cerebral dysfunction (encephalopathy).  Repeat ABG  after intubation showed improvement in pCO2 and O2 levels. LA levels have been normal.   His encephalopathy is likely multifactorial due to metabolic causes with elevated pCO2 levels and ongoing infection with C. Diff. Will continue treating with intubation for today and wean off sedation as able. Tx for  C diff per below.   -Continue intubation -PAD  with propofol  and fentanyl   -SBT when able  -Vancomycin , per below   Severe sepsis due to C. difficile enteritis, POA: Patient presented with fever, leukocytosis, lactic acidosis, tachycardia during admission. He tested positive for C. Diff and was started on oral vancomycin . Per charting, he has had 2 BM for the past two days. Pt did  not receive PO dose of vancomycin  yesterday. Spoke to ID, who recommended going ahead with vancomycin . Will continue vanc per tube for a 10 day-course.  -Continue vancomycin  per tube 125 mg 4 times daily   Hyperbilirubinemia: Likely secondary to sepsis.  Total bilirubin levels wnl this AM.    Hypertension: Bp stable. Doesn't require pressors at the moment.    Hyperlipidemia: On rosuvastatin  and Zetia .   Incomplete paraplegia with neurogenic bladder: History of thoracic spinal fusion and chronic incomplete paraplegia with neurogenic bladder. Was on external catheter on wards. Was retaining about 500 cc on bladder scan this AM. Used in and out cather once and placed on external catheter. If pt is consistently retaining urine then can switch to foley. Will hold baclofen  and gabapentin  as they are centrally acting meds and could be contributing to his present condition.   -Hold home baclofen  and gabapentin     Depression/anxiety: Continue home Prozac , Zyprexa , and Xanax     Chronic pain: Continue home Oxy IR 15 mg every 8 hours as needed for severe pain.   Has not received narcotics for a few days.   Gout: Continue allopurinol .   Labs   CBC: Recent Labs  Lab 07/09/24 1830 07/09/24 1845 07/10/24 0406 07/11/24 0244 07/12/24 9183 07/13/24 0138 07/13/24 0526 07/13/24 0832 07/14/24 0400 07/14/24 0646  WBC 21.7*  --  21.2* 25.1* 27.2*  --  23.8*  --   --   --   NEUTROABS 18.0*  --   --   --   --   --   --   --   --   --   HGB 16.4   < > 16.4 17.6* 17.9* 18.4* 17.3* 18.4* 17.0 16.7  HCT 51.1   < > 50.9 52.5* 54.3* 54.0* 52.2* 54.0* 50.0 49.0  MCV 100.4*  --  100.6* 96.9 97.7  --  96.5  --   --   --   PLT 227  --  183 240 238  --  251  --   --   --    < > = values in this interval not displayed.    Basic Metabolic Panel: Recent Labs  Lab 07/09/24 1826 07/09/24 1845 07/10/24 0406 07/11/24 0244 07/12/24 9183 07/13/24 0138 07/13/24 0526 07/13/24 0832 07/14/24 0400  07/14/24 0646  NA 137 139 137 133* 131* 130* 132* 133* 131* 137  K 4.0 3.8 3.8 4.6 5.1 4.8 5.0 5.0 4.5 4.7  CL 99 99 101 102 98  --  99  --   --   --   CO2 26  --  25 20* 22  --  21*  --   --   --   GLUCOSE 113* 116* 118* 144* 130*  --  125*  --   --   --   BUN 14 15 16  25* 46*  --  65*  --   --   --   CREATININE 1.07 1.00 0.94 1.33* 1.95*  --  2.44*  --   --   --   CALCIUM  8.5*  --  8.4* 8.3* 8.0*  --  7.7*  --   --   --   MG  --   --  2.2  --   --   --  3.0*  --   --   --    GFR: Estimated Creatinine Clearance: 32.7 mL/min (A) (by C-G formula based on SCr of 2.44 mg/dL (H)). Recent Labs  Lab 07/09/24 1846 07/09/24 2158 07/10/24 0406 07/11/24 0244 07/12/24 0816 07/13/24 0526  WBC  --   --  21.2* 25.1* 27.2* 23.8*  LATICACIDVEN 2.3* 1.0  --   --   --   --     Liver Function Tests: Recent Labs  Lab 07/09/24 1826 07/10/24 0406 07/11/24 0244 07/12/24 0816  AST 48* 37 30 41  ALT 36 32 21 25  ALKPHOS 241* 203* 165* 168*  BILITOT 2.6* 3.0* 1.5* 0.8  PROT 7.0 6.0* 5.3* 5.3*  ALBUMIN 3.5 3.0* 2.2* 2.2*   No results for input(s): LIPASE, AMYLASE in the last 168 hours. Recent Labs  Lab 07/13/24 1115  AMMONIA 35    ABG    Component Value Date/Time   PHART 7.263 (L) 07/14/2024 0646   PCO2ART 52.8 (H) 07/14/2024 0646   PO2ART 81 (L) 07/14/2024 0646   HCO3 23.9 07/14/2024 0646   TCO2 25 07/14/2024 0646   ACIDBASEDEF 4.0 (H) 07/14/2024 0646   O2SAT 94 07/14/2024 0646     Coagulation Profile: Recent Labs  Lab 07/10/24 0406  INR 1.5*    Cardiac Enzymes: No results for input(s): CKTOTAL, CKMB, CKMBINDEX, TROPONINI in the last 168 hours.  HbA1C: Hgb A1c MFr Bld  Date/Time Value Ref Range Status  09/11/2023 03:44 AM 5.3 4.8 - 5.6 % Final    Comment:    (NOTE) Pre diabetes:          5.7%-6.4%  Diabetes:              >6.4%  Glycemic control for   <7.0% adults with diabetes   04/06/2022 04:58 PM 5.3 4.8 - 5.6 % Final    Comment:    (NOTE) Pre  diabetes:          5.7%-6.4%  Diabetes:              >6.4%  Glycemic control for   <7.0% adults with diabetes     CBG: Recent Labs  Lab 07/14/24 0540 07/14/24 0804  GLUCAP 103* 91    Review of Systems:   Per above   Past Medical History:  He,  has a past medical history of Abnormal gait due to muscle weakness (03/28/2021), Acute blood loss anemia, Acute encephalopathy (04/05/2022), Acute on chronic anemia (04/07/2022), Acute respiratory failure with hypoxia (HCC) (04/07/2022), AKI (acute kidney injury), Anxiety, Arthritis of knee (03/28/2022), 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) (03/29/2022), Aspiration into airway (05/17/2024), Aspiration pneumonia (HCC) (04/12/2022), BMI 35.0-35.9,adult (03/02/2014), C/O Cirrhosis. Hx of daily Alcohol consumption (05/15/2024), Chronic pain (12/24/2023), Chronic pain syndrome, Cobalamin deficiency, Constipation, Cord compression (HCC) (07/20/2020), Degeneration of lumbar or lumbosacral intervertebral disc, Degenerative lumbar spinal stenosis (11/23/2019), Depression, Difficulty walking (12/11/2022), Disorder of spinal region [facility]) (12/11/2022), Displacement of lumbar intervertebral disc without myelopathy (03/02/2014), Dupuytren's contracture of right hand (03/17/2019), Esophageal dysphagia (05/17/2024), Foot-drop (12/11/2022), Gouty arthropathy, Hematemesis with nausea, Hemorrhoids, Hyperkalemia, Hyperlipidemia, Hypertension, Hypoalbuminemia due to protein-calorie malnutrition, Incomplete paraplegia (HCC) (09/08/2020), Low back pain (05/12/2023), Lumbago with sciatica, left side (03/22/2024), Lumbago with sciatica, right side (03/22/2024), Lumbar spondylosis (11/03/2013), Mitral regurgitation (03/23/2024), Moderate alcohol consumption (03/23/2024), Multiple fractures of ribs of left  side (04/20/2013), Muscle atrophy (12/11/2022), Neurogenic bladder, Neurogenic bowel, Neuropathic pain, OA (osteoarthritis) of knee (03/28/2022), Osteoarthritis of  right hip (01/07/2014), 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   Surgical History:   Past Surgical History:  Procedure Laterality Date   APPENDECTOMY     BACK SURGERY     x 6   CHOLECYSTECTOMY  06/24/2008   [facility]   ESOPHAGOGASTRODUODENOSCOPY N/A 04/06/2022   Procedure: ESOPHAGOGASTRODUODENOSCOPY (EGD);  Surgeon: Legrand Victory LITTIE DOUGLAS, MD;  Location: [facility] ENDOSCOPY;  Service: Gastroenterology;  Laterality: N/A;   FOOT SURGERY Right 05/2024   TONSILLECTOMY     TOTAL KNEE ARTHROPLASTY Right 03/28/2022   Procedure: RIGHT TOTAL KNEE ARTHROPLASTY;  Surgeon: Addie Cordella Hamilton, MD;  Location: [facility] OR;  Service: Orthopedics;  Laterality: Right;     Social History:   reports that he has never smoked. He quit smokeless tobacco use about 4 years ago.  His smokeless tobacco use included chew. He reports current alcohol use of about 1.0 standard drink of alcohol per week. He reports that he does not currently use drugs.   Family History:  His family history includes Alcoholism in his father; Cancer in his mother and son; Heart disease in his father.   Allergies Allergies[1]   Home Medications  Prior to Admission medications  Medication Sig Start Date End Date Taking? Authorizing Provider  albuterol  (PROVENTIL ) (2.5 MG/3ML) 0.083% nebulizer solution Take 2.5 mg by nebulization every 6 (six) hours as needed for wheezing or  shortness of breath.   Yes [provider]  allopurinol  (ZYLOPRIM ) 300 MG tablet Take 300 mg by mouth daily. 05/12/24  Yes [provider]  ALPRAZolam  (XANAX ) 1 MG tablet Take 1 mg by mouth 3 (three) times daily as needed for anxiety.   Yes [provider]  amLODipine  (NORVASC ) 2.5 MG tablet Take 2.5 mg by mouth daily.   Yes [provider]  aspirin  EC 81 MG tablet Take 81 mg by mouth daily. Swallow whole.   Yes [provider]  baclofen  (LIORESAL ) 10 MG tablet Take 5 mg by mouth daily. 06/12/24  Yes [provider]  ezetimibe  (ZETIA ) 10 MG tablet Take 10 mg by mouth at bedtime. 04/29/20  Yes [provider]  FLUoxetine  (PROZAC ) 40 MG capsule Take 40 mg by mouth daily. 03/22/24  Yes [provider]  fluticasone (FLONASE) 50 MCG/ACT nasal spray Place 1 spray into both nostrils 2 (two) times daily as needed for allergies or rhinitis.   Yes [provider]  folic acid  (FOLVITE ) 1 MG tablet Take 1 mg by mouth daily. 07/29/23  Yes [provider]  gabapentin  (NEURONTIN ) 300 MG capsule Take 300 mg by mouth 2 (two) times daily. 04/13/24  Yes [provider]  OLANZapine  (ZYPREXA ) 2.5 MG tablet Take 2.5 mg by mouth at bedtime.   Yes [provider]  oxyCODONE  (ROXICODONE ) 15 MG immediate release tablet Take 15 mg by mouth every 8 (eight) hours as needed for pain. 04/07/24  Yes [provider]  pantoprazole  (PROTONIX ) 40 MG tablet Take 40 mg by mouth daily. 08/25/23  Yes [provider]  polyethylene glycol (MIRALAX  /  GLYCOLAX ) 17 g packet Take 17 g by mouth 3 (three) times daily. Patient taking differently: Take 17 g by mouth daily. May take 1 additional tablet as needed for constipation 12/27/23  Yes Ghimire, Donalda HERO, MD  Prenatal Vit-Fe Fumarate-FA (PRENATAL VITAMIN PO) Take 1 tablet by mouth daily.   Yes [provider]  rosuvastatin  (CRESTOR ) 5 MG tablet Take 1 tablet (5 mg  total) by mouth daily. 03/23/24 07/09/25 Yes Madireddy, Alean SAUNDERS, MD  solifenacin (VESICARE) 10 MG tablet Take 10 mg by mouth daily. 07/01/24  Yes [provider]  thiamine  (VITAMIN B1) 100 MG tablet Take 100 mg by mouth daily.   Yes [provider]  VENTOLIN  HFA 108 (90 Base) MCG/ACT inhaler Inhale 1-2 puffs into the lungs every 4 (four) hours as needed for wheezing or shortness of breath. 08/22/23  Yes [provider]  tamsulosin  (FLOMAX ) 0.4 MG CAPS capsule Take 1 capsule (0.4 mg total) by mouth daily after supper. Patient not taking: Reported on 07/09/2024 12/27/23   Raenelle Donalda HERO, MD     Critical care time: 63    Rebecka Pion, DO IM PGY1          [1] No Known Allergies  "

## 2024-07-14 NOTE — Procedures (Addendum)
 Intubation Procedure Note  Randall Mccormick  [id number]  1955/09/25  Date:07/14/24  Time:6:51 AM   Provider Performing:Abhiraj Dozal Randall Mccormick    Procedure: Intubation (31500)  Indication(s) Respiratory Failure  Consent Risks of the procedure as well as the alternatives and risks of each were explained to the patient and/or caregiver.  Consent for the procedure was obtained and is signed in the bedside chart   Anesthesia Etomidate  and Fentanyl    Time Out Verified patient identification, verified procedure, site/side was marked, verified correct patient position, special equipment/implants available, medications/allergies/relevant history reviewed, required imaging and test results available.   Sterile Technique Usual hand hygeine, masks, and gloves were used   Procedure Description Patient positioned in bed supine.  Sedation given as noted above.  Patient was intubated with endotracheal tube using Glidescope.  View was Grade 1 full glottis .  Number of attempts was 1.  Colorimetric CO2 detector was consistent with tracheal placement.   Complications/Tolerance None; patient tolerated the procedure well. Chest X-ray is ordered to verify placement.   EBL Minimal   Specimen(s) None  Thick large volume creamy secretions from upper airway and tracheal suctioning. Sent for Cx. Rx as aspiration PNA. See orders.

## 2024-07-14 NOTE — Progress Notes (Addendum)
 Initial Nutrition Assessment  DOCUMENTATION CODES:  Not applicable  INTERVENTION:  Initiate tube feeding via NGT: Vital 1.5 at 60 ml/h (1440 ml per day) Start at 20 and advance by 10mL every 8 hours to reach goal Prosource TF20 60 ml 1x/d Provides 2240 kcal, 117 gm protein, 1100 ml free water  daily Pt is at risk for refeeding syndrome given poor PO intake. Monitor magnesium  and phosphorus daily x 3 days, MD to replete as needed. 100mg  thiamine  x 7 days  NUTRITION DIAGNOSIS:  Inadequate oral intake related to inability to eat as evidenced by NPO status.  GOAL:  Patient will meet greater than or equal to 90% of their needs  MONITOR:  TF tolerance, Vent status, I & O's, Labs  REASON FOR ASSESSMENT:  Ventilator, Consult Enteral/tube feeding initiation and management  ASSESSMENT:  Pt with hx of HTN, HLD, incomplete paraplegia with neurogenic bowel/bladder,   presented to ED with fever, bloody diarrhea, and AMS. Found to be positive for Clostridium difficile after multiple recent rounds of antibiotics.  1/16 - presented to ED 1/21 - intubated   Patient is currently intubated on ventilator support. Wife at bedside able to provide a nutrition hx. Pt admitted on 1/16. Wife reports that he has not eaten anything at all for the last 2 days but that earlier in admission she was able to get him to eat a little.   Reports that she has also noticed a change in his appetite at home which has gotten worse over the last year or so. States that at home, he may or may not have breakfast and lunch but always eats dinner. For breakfast, if he eats it will be something smaller like a bowl of cereal but that recently he has been doing a boost shake instead. If he has lunch, will be something like a sandwich. Dinner she cooks a larger meal with a meal and sides.   Wife also mentions that pt has been having some issues with swallowing PTA. On a chopped diet at baseline. Gets SLP and PT at home.   At  baseline, pt is not very mobile due to a previous fall which resulted in incomplete paraplegia, but that he is able to get around their house with a rollator. However, has noted that over the last few weeks pt reports less strength in his legs and he has needed a transfer chair to get around.   On exam, pt with some mild deficits noted. May have a degree of acute malnutrition present, but difficult to determine as some edema is present and pt also has not been weighed this admission aside from today when he was transferred to ICU.   Discussed with resident, will initiate enteral feeds.  MV: 13.5 L/min Temp (24hrs), Avg:98 F (36.7 C), Min:97.2 F (36.2 C), Max:99 F (37.2 C) MAP (cuff): 73-76mmHg this AM  Propofol : 5.99 ml/hr  Admit / Current weight: 99.8 kg    Intake/Output Summary (Last 24 hours) at 07/14/2024 1447 Last data filed at 07/14/2024 1445 Gross per 24 hour  Intake 773.45 ml  Output 1200 ml  Net -426.55 ml  Net IO Since Admission: 1,645.84 mL [07/14/24 1447]  Drains/Lines: CVC Triple Lumen, left IJ FMS x 24 hours UOP x 24 hours NGT (gastric)  Average Meal Intake: None recorded in flowsheet this admission  Nutritionally Relevant Medications: Scheduled Meds:  baclofen   5 mg Oral Daily   ezetimibe   10 mg Oral QHS   famotidine   20 mg Oral  QHS   rosuvastatin   5 mg Oral Daily   vancomycin   125 mg Oral QID   Continuous Infusions:  fentaNYL  infusion INTRAVENOUS 50 mcg/hr (07/14/24 0726)   piperacillin -tazobactam (ZOSYN )  IV     propofol  (DIPRIVAN ) infusion 10 mcg/kg/min (07/14/24 0730)   vancomycin  2,000 mg (07/14/24 0849)   PRN Meds: ondansetron   Labs Reviewed: Potassium 6.0 BUN 68, creatinine 2.15 CBG ranges from 91-103 mg/dL over the last 24 hours HgbA1c 5.3% (09/11/23)  NUTRITION - FOCUSED PHYSICAL EXAM: Flowsheet Row Most Recent Value  Orbital Region Mild depletion  Upper Arm Region No depletion  Thoracic and Lumbar Region No depletion   Buccal Region Unable to assess  [ETT holder]  Temple Region Mild depletion  Clavicle Bone Region No depletion  Clavicle and Acromion Bone Region No depletion  Scapular Bone Region Mild depletion  Dorsal Hand No depletion  Patellar Region No depletion  Anterior Thigh Region No depletion  Posterior Calf Region No depletion  Edema (RD Assessment) Moderate  [pitting to the right lower leg]  Hair Reviewed  Eyes Reviewed  Mouth Reviewed  Skin Reviewed  Nails Reviewed    Diet Order:   Diet Order     None       EDUCATION NEEDS:  Education needs have been addressed  Skin:  Skin Assessment: Reviewed RN Assessment  Last BM:  1/20 - type 7 FMS placed 1/17  Height:  Ht Readings from Last 1 Encounters:  07/14/24 5' 8 (1.727 m)    Weight:  Wt Readings from Last 1 Encounters:  07/14/24 99.8 kg    Ideal Body Weight:  63.6 kg  BMI:  Body mass index is 33.45 kg/m.  Estimated Nutritional Needs:  Kcal:  2100-2300 kcal/d Protein:  105-120g/d Fluid:  2.2 L/d    Randall Mccormick, RD, LDN, CNSC Registered Dietitian II Please reach out via secure chat

## 2024-07-14 NOTE — Progress Notes (Signed)
 PT Cancellation Note  Patient Details Name: Cadin Luka MRN: [id number] DOB: [birth year]   Cancelled Treatment:    Reason Eval/Treat Not Completed: Medical issues which prohibited therapy (Pt not tolerating BIPAP and intubated this AM. Will follow up as able and appropriate.)  Dorothyann Maier, DPT, CLT  Acute Rehabilitation Services Office: [phone] (Secure chat preferred)   Dorothyann VEAR Maier 07/14/2024, 12:14 PM

## 2024-07-14 NOTE — Progress Notes (Signed)
 eLink Physician-Brief Progress Note Patient Name: Randall Mccormick DOB: 04/22/1956 MRN: [id number]   Date of Service  07/14/2024  HPI/Events of Note  Bedside RN requesting ground crew evaluation of patient for possible intubation due to worsening respiratory status on BIPAP. Patient is in a non-camera room.  eICU Interventions  Ground crew requested to evaluate patient at bedside.        Alletta Mattos U Zailee Vallely 07/14/2024, 4:34 AM

## 2024-07-14 NOTE — Plan of Care (Signed)
" °  Problem: Fluid Volume: Goal: Hemodynamic stability will improve Outcome: Progressing   Problem: Clinical Measurements: Goal: Diagnostic test results will improve Outcome: Progressing Goal: Signs and symptoms of infection will decrease Outcome: Progressing   Problem: Respiratory: Goal: Ability to maintain adequate ventilation will improve Outcome: Progressing   Problem: Clinical Measurements: Goal: Ability to maintain clinical measurements within normal limits will improve Outcome: Progressing Goal: Will remain free from infection Outcome: Progressing Goal: Diagnostic test results will improve Outcome: Progressing Goal: Respiratory complications will improve Outcome: Progressing Goal: Cardiovascular complication will be avoided Outcome: Progressing   Problem: Activity: Goal: Risk for activity intolerance will decrease Outcome: Progressing   Problem: Nutrition: Goal: Adequate nutrition will be maintained Outcome: Progressing   Problem: Coping: Goal: Level of anxiety will decrease Outcome: Progressing   Problem: Safety: Goal: Ability to remain free from injury will improve Outcome: Progressing   Problem: Skin Integrity: Goal: Risk for impaired skin integrity will decrease Outcome: Progressing   Problem: Elimination: Goal: Will not experience complications related to bowel motility Outcome: Progressing Goal: Will not experience complications related to urinary retention Outcome: Progressing   Problem: Pain Managment: Goal: General experience of comfort will improve and/or be controlled Outcome: Progressing   "

## 2024-07-14 NOTE — Progress Notes (Signed)
 eLink Physician-Brief Progress Note Patient Name: Randall Mccormick DOB: 13-Jun-1956 MRN: [id number]   Date of Service  07/14/2024  HPI/Events of Note  Patient being treated on the floor for diarrhea and fever who developed acute respiratory failure requiring emergent intubation, patient in now in the ICU on the ventilator, work-up in progress.  eICU Interventions  New Patient Evaluation.        Etosha Wetherell U Mysty Kielty 07/14/2024, 6:57 AM

## 2024-07-14 NOTE — Progress Notes (Signed)
 Patient transferred to higher level of care, 77M room 15 for intubation due to abnormal blood gas. All personal belongings transferred, wife notified.

## 2024-07-14 NOTE — Procedures (Signed)
 Central Venous Catheter Insertion Procedure Note  Cristen Murcia  [id number]  [birth year]  Date:07/14/24  Time:6:47 AM   Provider Performing:Zai Chmiel [name]   Procedure: Insertion of Non-tunneled Central Venous Catheter(36556) with US  guidance (23062)   Indication(s) Medication administration and Difficult access  Consent Risks of the procedure as well as the alternatives and risks of each were explained to the patient and/or caregiver.  Consent for the procedure was obtained and is signed in the bedside chart  Anesthesia Moderate sedation   Timeout Verified patient identification, verified procedure, site/side was marked, verified correct patient position, special equipment/implants available, medications/allergies/relevant history reviewed, required imaging and test results available.  Sterile Technique Maximal sterile technique including full sterile barrier drape, hand hygiene, sterile gown, sterile gloves, mask, hair covering, sterile ultrasound probe cover (if used).  Procedure Description Area of catheter insertion was cleaned with chlorhexidine  and draped in sterile fashion.  With real-time ultrasound guidance a central venous catheter was placed into the left internal jugular vein. Nonpulsatile blood flow and easy flushing noted in all ports.  The catheter was sutured in place and sterile dressing applied.  Complications/Tolerance None; patient tolerated the procedure well. Chest X-ray is ordered to verify placement for internal jugular or subclavian cannulation.   Chest x-ray is not ordered for femoral cannulation.  EBL Minimal  Specimen(s) None

## 2024-07-15 DIAGNOSIS — F419 Anxiety disorder, unspecified: Secondary | ICD-10-CM | POA: Diagnosis not present

## 2024-07-15 DIAGNOSIS — J9601 Acute respiratory failure with hypoxia: Secondary | ICD-10-CM | POA: Diagnosis not present

## 2024-07-15 DIAGNOSIS — A0472 Enterocolitis due to Clostridium difficile, not specified as recurrent: Secondary | ICD-10-CM | POA: Diagnosis not present

## 2024-07-15 DIAGNOSIS — N179 Acute kidney failure, unspecified: Secondary | ICD-10-CM | POA: Diagnosis not present

## 2024-07-15 DIAGNOSIS — A419 Sepsis, unspecified organism: Secondary | ICD-10-CM | POA: Diagnosis not present

## 2024-07-15 DIAGNOSIS — E785 Hyperlipidemia, unspecified: Secondary | ICD-10-CM | POA: Diagnosis not present

## 2024-07-15 DIAGNOSIS — F32A Depression, unspecified: Secondary | ICD-10-CM | POA: Diagnosis not present

## 2024-07-15 DIAGNOSIS — J189 Pneumonia, unspecified organism: Secondary | ICD-10-CM

## 2024-07-15 DIAGNOSIS — I10 Essential (primary) hypertension: Secondary | ICD-10-CM | POA: Diagnosis not present

## 2024-07-15 DIAGNOSIS — G9341 Metabolic encephalopathy: Secondary | ICD-10-CM | POA: Diagnosis not present

## 2024-07-15 DIAGNOSIS — R6520 Severe sepsis without septic shock: Secondary | ICD-10-CM | POA: Diagnosis not present

## 2024-07-15 LAB — GLUCOSE, CAPILLARY
Glucose-Capillary: 136 mg/dL — ABNORMAL HIGH (ref 70–99)
Glucose-Capillary: 138 mg/dL — ABNORMAL HIGH (ref 70–99)
Glucose-Capillary: 139 mg/dL — ABNORMAL HIGH (ref 70–99)
Glucose-Capillary: 142 mg/dL — ABNORMAL HIGH (ref 70–99)
Glucose-Capillary: 150 mg/dL — ABNORMAL HIGH (ref 70–99)
Glucose-Capillary: 152 mg/dL — ABNORMAL HIGH (ref 70–99)
Glucose-Capillary: 161 mg/dL — ABNORMAL HIGH (ref 70–99)

## 2024-07-15 LAB — MAGNESIUM: Magnesium: 3 mg/dL — ABNORMAL HIGH (ref 1.7–2.4)

## 2024-07-15 LAB — CBC
HCT: 46 % (ref 39.0–52.0)
Hemoglobin: 15.6 g/dL (ref 13.0–17.0)
MCH: 32.4 pg (ref 26.0–34.0)
MCHC: 33.9 g/dL (ref 30.0–36.0)
MCV: 95.6 fL (ref 80.0–100.0)
Platelets: 233 K/uL (ref 150–400)
RBC: 4.81 MIL/uL (ref 4.22–5.81)
RDW: 17.3 % — ABNORMAL HIGH (ref 11.5–15.5)
WBC: 29.5 K/uL — ABNORMAL HIGH (ref 4.0–10.5)
nRBC: 0.1 % (ref 0.0–0.2)

## 2024-07-15 LAB — COMPREHENSIVE METABOLIC PANEL WITH GFR
ALT: 41 U/L (ref 0–44)
AST: 79 U/L — ABNORMAL HIGH (ref 15–41)
Albumin: 2.2 g/dL — ABNORMAL LOW (ref 3.5–5.0)
Alkaline Phosphatase: 153 U/L — ABNORMAL HIGH (ref 38–126)
Anion gap: 12 (ref 5–15)
BUN: 62 mg/dL — ABNORMAL HIGH (ref 8–23)
CO2: 21 mmol/L — ABNORMAL LOW (ref 22–32)
Calcium: 7.2 mg/dL — ABNORMAL LOW (ref 8.9–10.3)
Chloride: 106 mmol/L (ref 98–111)
Creatinine, Ser: 1.51 mg/dL — ABNORMAL HIGH (ref 0.61–1.24)
GFR, Estimated: 50 mL/min — ABNORMAL LOW
Glucose, Bld: 147 mg/dL — ABNORMAL HIGH (ref 70–99)
Potassium: 4.5 mmol/L (ref 3.5–5.1)
Sodium: 140 mmol/L (ref 135–145)
Total Bilirubin: 0.6 mg/dL (ref 0.0–1.2)
Total Protein: 5.2 g/dL — ABNORMAL LOW (ref 6.5–8.1)

## 2024-07-15 LAB — TRIGLYCERIDES: Triglycerides: 115 mg/dL

## 2024-07-15 LAB — PHOSPHORUS: Phosphorus: 2.3 mg/dL — ABNORMAL LOW (ref 2.5–4.6)

## 2024-07-15 MED ORDER — SODIUM PHOSPHATES 45 MMOLE/15ML IV SOLN
15.0000 mmol | Freq: Once | INTRAVENOUS | Status: AC
  Administered 2024-07-15: 15 mmol via INTRAVENOUS
  Filled 2024-07-15: qty 5

## 2024-07-15 MED ORDER — THIAMINE HCL 100 MG/ML IJ SOLN
500.0000 mg | Freq: Three times a day (TID) | INTRAVENOUS | Status: AC
  Administered 2024-07-15 – 2024-07-18 (×9): 500 mg via INTRAVENOUS
  Filled 2024-07-15: qty 500
  Filled 2024-07-15: qty 5
  Filled 2024-07-15: qty 500
  Filled 2024-07-15: qty 5
  Filled 2024-07-15: qty 500
  Filled 2024-07-15: qty 5
  Filled 2024-07-15 (×3): qty 500

## 2024-07-15 MED ORDER — K PHOS MONO-SOD PHOS DI & MONO 155-852-130 MG PO TABS: 500.0000 mg | ORAL_TABLET | ORAL | Status: DC

## 2024-07-15 MED ORDER — THIAMINE HCL 100 MG/ML IJ SOLN: 100.0000 mg | Freq: Every day | INTRAMUSCULAR | Status: DC

## 2024-07-15 MED ORDER — THIAMINE MONONITRATE 100 MG PO TABS: 500.0000 mg | ORAL_TABLET | Freq: Every day | ORAL | Status: DC

## 2024-07-15 MED ORDER — PIPERACILLIN-TAZOBACTAM 3.375 G IVPB
3.3750 g | Freq: Three times a day (TID) | INTRAVENOUS | Status: DC
  Administered 2024-07-15 – 2024-07-16 (×4): 3.375 g via INTRAVENOUS
  Filled 2024-07-15 (×4): qty 50

## 2024-07-15 NOTE — Progress Notes (Signed)
 "  NAME:  Randall Mccormick, MRN:  [id number], DOB:  1956-04-21, LOS: 6 ADMISSION DATE:  07/09/2024, CONSULTATION DATE:  07/14/24 REFERRING MD: None , CHIEF COMPLAINT:  Respiratory Decline   History of Present Illness:  Patient is a Randall Mccormick is a 69 y.o. 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  In the ED, patient was tachycardic with fever.  Labs showed creatinine of 1.0.  Lactate of 2.3.  Total bilirubin of 2.6.  COVID influenza and RSV was negative.  Chest x-ray showed cardiomegaly.  CT scan of the abdomen pelvis showed fluid throughout the colon consistent with diarrhea/enteritis. He was tested positive for C. Diff and was being treated with PO vancomycin .  On 1/20 PCCM was consulted to evaluate the pt for not being responsive and having increased WOB. Pt was transitioned from [place] to Bipap. PCCM recommended pt being monitored on the floor as he was tolerating bipap well and was maintaining his airway, with low threshold to move to ICU if need be.   On 1/21 pt had worsening respiratory status and and abnormal ABG requiring intubation. Repeat ABG showed an improvement in Pco2, and O2 levels.  Pertinent  Medical History  Per above   Significant Hospital Events: Including procedures, antibiotic start and stop dates in addition to other pertinent events   1/16: admitted  1/20: Became less responsive with increased WOB 1/21: Intubated and admitted to ICU for decline in respiratory status   Interim History / Subjective:  Saw pt at bedside. Sedated and not following commands.   Objective    Blood pressure (!) 95/53, pulse (!) 102, temperature (!) 103.2 F (39.6 C), temperature source Oral, resp. rate (!) 26, height 5' 8 (1.727 m), weight 99.8 kg, SpO2 91%.    Vent Mode: PRVC FiO2  (%):  [50 %-60 %] 50 % Set Rate:  [26 bmp] 26 bmp Vt Set:  [550 mL] 550 mL PEEP:  [8 cmH20] 8 cmH20 Plateau Pressure:  [23 cmH20-25 cmH20] 23 cmH20   Intake/Output Summary (Last 24 hours) at 07/15/2024 1453 Last data filed at 07/15/2024 1300 Gross per 24 hour  Intake 826.57 ml  Output 100 ml  Net 726.57 ml   Filed Weights   07/14/24 0500  Weight: 99.8 kg    Examination: General: sedated, not following commands  HENT: Randall Mccormick/ AT, preserved corneal reflex  Lungs: coarse breath sounds Cardiovascular: RRR Abdomen: distended but soft  Extremities: +1 BL LE pitting edema  Neuro: sedated, not following commands   Resolved problem list   Assessment and Plan   Metabolic Encephalopathy  Respiratory decline  -MR was negative for acute intracranial abnormality.  -No electrolyte derangements this AM.  BUN/ Cr levels improving.  -Sedation was turned off, however, pt was still unresponsive.  -Spoke to neurology who recommended giving him at least 24 hours and reevaluate as he has had a slow recovery for encephalopathy in the past as well. Neurology to re-evaluate if his condition remains the same tomorrow morning.   -Will reach out to neurology tomorrow if condition doesn't improve  -Scheduled Oxycodone  per below -Continue intubation -PAD  with propofol  and fentanyl   -SBT when able  -Vancomycin , per below   Severe sepsis due to C. difficile enteritis, POA ?Respiratory Infection Stools have slowed down as pt only put out 50 mL in the past 24 hours. WBC count increasing with elevated temperatures. Prelim  resp cx do show some gram negative rods. Will start pt on zosyn .  -Continue vancomycin  per tube 125 mg 4 times daily to complete 10-day course -Start zosyn   Hyperbilirubinemia: Likely secondary to sepsis.  Total bilirubin levels wnl this AM.    Hypertension: Bp stable. Doesn't require pressors at the moment.    Hyperlipidemia: On rosuvastatin  and Zetia .   Incomplete paraplegia  with neurogenic bladder AKI likely 2/2 to sepsis per above  Good urine OP.   -Monitor Cr/Bun level daily  -Hold home baclofen  and gabapentin     Depression/anxiety: Continue home Prozac , Zyprexa , and Xanax     Chronic pain: Continue home Oxy IR 15 mg every 8 hours for severe pain and agitation.       Gout: Continue allopurinol .   Labs   CBC: Recent Labs  Lab 07/09/24 1830 07/09/24 1845 07/11/24 0244 07/12/24 0816 07/13/24 0138 07/13/24 0526 07/13/24 0832 07/14/24 0400 07/14/24 0646 07/14/24 0850 07/15/24 0401  WBC 21.7*   < > 25.1* 27.2*  --  23.8*  --   --   --  19.1* 29.5*  NEUTROABS 18.0*  --   --   --   --   --   --   --   --   --   --   HGB 16.4   < > 17.6* 17.9*   < > 17.3* 18.4* 17.0 16.7 15.1 15.6  HCT 51.1   < > 52.5* 54.3*   < > 52.2* 54.0* 50.0 49.0 45.7 46.0  MCV 100.4*   < > 96.9 97.7  --  96.5  --   --   --  98.3 95.6  PLT 227   < > 240 238  --  251  --   --   --  209 233   < > = values in this interval not displayed.    Basic Metabolic Panel: Recent Labs  Lab 07/10/24 0406 07/11/24 0244 07/12/24 0816 07/13/24 0138 07/13/24 0526 07/13/24 0832 07/14/24 0400 07/14/24 0646 07/14/24 0836 07/14/24 1132 07/15/24 0401  NA 137   < > 131*   < > 132*   < > 131* 137 136 137 140  K 3.8   < > 5.1   < > 5.0   < > 4.5 4.7 6.0* 4.4 4.5  CL 101   < > 98  --  99  --   --   --  103 102 106  CO2 25   < > 22  --  21*  --   --   --  20* 21* 21*  GLUCOSE 118*   < > 130*  --  125*  --   --   --  95 90 147*  BUN 16   < > 46*  --  65*  --   --   --  68* 68* 62*  CREATININE 0.94   < > 1.95*  --  2.44*  --   --   --  2.15* 1.98* 1.51*  CALCIUM  8.4*   < > 8.0*  --  7.7*  --   --   --  7.3* 7.1* 7.2*  MG 2.2  --   --   --  3.0*  --   --   --   --  2.8* 3.0*  PHOS  --   --   --   --   --   --   --   --   --  3.8 2.3*   < > =  values in this interval not displayed.   GFR: Estimated Creatinine Clearance: 52.9 mL/min (A) (by C-G formula based on SCr of 1.51 mg/dL  (H)). Recent Labs  Lab 07/09/24 1846 07/09/24 2158 07/10/24 0406 07/12/24 0816 07/13/24 0526 07/14/24 0850 07/14/24 1132 07/14/24 1448 07/15/24 0401  WBC  --   --    < > 27.2* 23.8* 19.1*  --   --  29.5*  LATICACIDVEN 2.3* 1.0  --   --   --   --  1.4 1.3  --    < > = values in this interval not displayed.    Liver Function Tests: Recent Labs  Lab 07/11/24 0244 07/12/24 0816 07/14/24 0836 07/14/24 1132 07/15/24 0401  AST 30 41 78* 80* 79*  ALT 21 25 34 35 41  ALKPHOS 165* 168* 122 122 153*  BILITOT 1.5* 0.8 0.8 0.7 0.6  PROT 5.3* 5.3* 5.0* 4.8* 5.2*  ALBUMIN 2.2* 2.2* 2.1* 2.1* 2.2*   No results for input(s): LIPASE, AMYLASE in the last 168 hours. Recent Labs  Lab 07/13/24 1115  AMMONIA 35    ABG    Component Value Date/Time   PHART 7.263 (L) 07/14/2024 0646   PCO2ART 52.8 (H) 07/14/2024 0646   PO2ART 81 (L) 07/14/2024 0646   HCO3 23.9 07/14/2024 0646   TCO2 25 07/14/2024 0646   ACIDBASEDEF 4.0 (H) 07/14/2024 0646   O2SAT 94 07/14/2024 0646     Coagulation Profile: Recent Labs  Lab 07/10/24 0406  INR 1.5*    Cardiac Enzymes: No results for input(s): CKTOTAL, CKMB, CKMBINDEX, TROPONINI in the last 168 hours.  HbA1C: Hgb A1c MFr Bld  Date/Time Value Ref Range Status  09/11/2023 03:44 AM 5.3 4.8 - 5.6 % Final    Comment:    (NOTE) Pre diabetes:          5.7%-6.4%  Diabetes:              >6.4%  Glycemic control for   <7.0% adults with diabetes   04/06/2022 04:58 PM 5.3 4.8 - 5.6 % Final    Comment:    (NOTE) Pre diabetes:          5.7%-6.4%  Diabetes:              >6.4%  Glycemic control for   <7.0% adults with diabetes     CBG: Recent Labs  Lab 07/14/24 2011 07/15/24 0003 07/15/24 0356 07/15/24 0801 07/15/24 1158  GLUCAP 139* 139* 136* 152* 142*    Review of Systems:   Per above   Past Medical History:  He,  has a past medical history of Abnormal gait due to muscle weakness (03/28/2021), Acute blood loss  anemia, Acute encephalopathy (04/05/2022), Acute on chronic anemia (04/07/2022), Acute respiratory failure with hypoxia (HCC) (04/07/2022), AKI (acute kidney injury), Anxiety, Arthritis of knee (03/28/2022), 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) (03/29/2022), Aspiration into airway (05/17/2024), Aspiration pneumonia (HCC) (04/12/2022), BMI 35.0-35.9,adult (03/02/2014), C/O Cirrhosis. Hx of daily Alcohol consumption (05/15/2024), Chronic pain (12/24/2023), Chronic pain syndrome, Cobalamin deficiency, Constipation, Cord compression (HCC) (07/20/2020), Degeneration of lumbar or lumbosacral intervertebral disc, Degenerative lumbar spinal stenosis (11/23/2019), Depression, Difficulty walking (12/11/2022), Disorder of spinal region [facility]) (12/11/2022), Displacement of lumbar intervertebral disc without myelopathy (03/02/2014), Dupuytren's contracture of right hand (03/17/2019), Esophageal dysphagia (05/17/2024), Foot-drop (12/11/2022), Gouty arthropathy, Hematemesis with nausea, Hemorrhoids, Hyperkalemia, Hyperlipidemia, Hypertension, Hypoalbuminemia due to protein-calorie malnutrition, Incomplete paraplegia (HCC) (09/08/2020), Low back pain (05/12/2023), Lumbago with sciatica, left side (03/22/2024), Lumbago with sciatica, right  side (03/22/2024), Lumbar spondylosis (11/03/2013), Mitral regurgitation (03/23/2024), Moderate alcohol consumption (03/23/2024), Multiple fractures of ribs of left side (04/20/2013), Muscle atrophy (12/11/2022), Neurogenic bladder, Neurogenic bowel, Neuropathic pain, OA (osteoarthritis) of knee (03/28/2022), Osteoarthritis of right hip (01/07/2014), 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   Surgical History:   Past Surgical History:  Procedure Laterality Date   APPENDECTOMY     BACK SURGERY     x 6   CHOLECYSTECTOMY  06/24/2008   [facility]   ESOPHAGOGASTRODUODENOSCOPY N/A 04/06/2022   Procedure: ESOPHAGOGASTRODUODENOSCOPY (EGD);  Surgeon: Legrand Victory LITTIE DOUGLAS, MD;  Location: [facility] ENDOSCOPY;  Service: Gastroenterology;  Laterality: N/A;   FOOT SURGERY Right 05/2024   TONSILLECTOMY     TOTAL KNEE ARTHROPLASTY Right 03/28/2022   Procedure: RIGHT TOTAL KNEE ARTHROPLASTY;  Surgeon: Addie Cordella Hamilton, MD;  Location: [facility] OR;  Service: Orthopedics;  Laterality: Right;     Social History:   reports that he has never smoked. He quit smokeless tobacco use about 4 years ago.  His smokeless tobacco use included chew. He reports current alcohol use of about 1.0 standard drink of alcohol per week. He reports that he does not currently use drugs.   Family History:  His family history includes Alcoholism in his father; Cancer in his mother and son; Heart disease in his father.   Allergies Allergies[1]   Home Medications  Prior to Admission medications  Medication Sig Start Date End Date Taking? Authorizing Provider  albuterol  (PROVENTIL ) (2.5 MG/3ML) 0.083% nebulizer solution Take 2.5 mg by nebulization every 6 (six) hours as needed for wheezing or shortness of breath.   Yes [provider]  allopurinol  (ZYLOPRIM ) 300 MG tablet Take 300 mg by mouth daily. 05/12/24  Yes [provider]  ALPRAZolam  (XANAX ) 1 MG tablet Take 1 mg by mouth 3 (three) times daily as needed for anxiety.   Yes [provider]  amLODipine  (NORVASC ) 2.5 MG tablet Take 2.5 mg by mouth daily.   Yes [provider]  aspirin  EC 81 MG tablet Take 81 mg by mouth daily. Swallow whole.   Yes [provider]  baclofen  (LIORESAL ) 10 MG tablet Take 5 mg by mouth daily. 06/12/24  Yes [provider]  ezetimibe  (ZETIA ) 10 MG tablet Take 10 mg by mouth at bedtime. 04/29/20  Yes [provider]  FLUoxetine  (PROZAC ) 40 MG capsule Take 40 mg by mouth daily. 03/22/24  Yes [provider]  fluticasone (FLONASE) 50 MCG/ACT nasal spray Place 1 spray into both nostrils 2 (two) times daily as needed for allergies or rhinitis.   Yes [provider]  folic acid  (FOLVITE ) 1 MG tablet Take 1 mg by mouth daily. 07/29/23  Yes [provider]  gabapentin  (NEURONTIN ) 300 MG capsule Take 300 mg by mouth 2 (two) times daily. 04/13/24  Yes [provider]  OLANZapine  (ZYPREXA ) 2.5 MG tablet Take 2.5 mg by mouth at bedtime.   Yes [provider]  oxyCODONE  (ROXICODONE ) 15 MG immediate release tablet Take 15 mg by mouth every 8 (eight) hours as needed for pain. 04/07/24  Yes [provider]  pantoprazole  (PROTONIX ) 40 MG  tablet Take 40 mg by mouth daily. 08/25/23  Yes [provider]  polyethylene glycol (MIRALAX  / GLYCOLAX ) 17 g packet Take 17 g by mouth 3 (three) times daily. Patient taking differently: Take 17 g by mouth daily. May take 1 additional tablet as needed for constipation 12/27/23  Yes Ghimire, Donalda HERO, MD  Prenatal Vit-Fe Fumarate-FA (PRENATAL VITAMIN PO) Take 1 tablet by mouth daily.   Yes [provider]  rosuvastatin  (CRESTOR ) 5 MG tablet Take 1 tablet (5 mg total) by mouth daily. 03/23/24 07/09/25 Yes Madireddy, Alean SAUNDERS, MD  solifenacin (VESICARE) 10 MG tablet Take 10 mg by mouth daily. 07/01/24  Yes [provider]  thiamine  (VITAMIN B1) 100 MG tablet Take 100 mg by mouth daily.   Yes [provider]  VENTOLIN  HFA 108 (90 Base) MCG/ACT inhaler Inhale 1-2 puffs into the lungs every 4 (four) hours as  needed for wheezing or shortness of breath. 08/22/23  Yes [provider]  tamsulosin  (FLOMAX ) 0.4 MG CAPS capsule Take 1 capsule (0.4 mg total) by mouth daily after supper. Patient not taking: Reported on 07/09/2024 12/27/23   Raenelle Donalda HERO, MD     Critical care time: 78    Rebecka Pion, DO IM PGY1           [1] No Known Allergies  "

## 2024-07-15 NOTE — Plan of Care (Signed)
" °  Problem: Clinical Measurements: Goal: Ability to maintain clinical measurements within normal limits will improve Outcome: Progressing   Problem: Nutrition: Goal: Adequate nutrition will be maintained Outcome: Progressing   Problem: Safety: Goal: Ability to remain free from injury will improve Outcome: Progressing   Problem: Pain Managment: Goal: General experience of comfort will improve and/or be controlled Outcome: Progressing   "

## 2024-07-15 NOTE — Progress Notes (Signed)
 PT Cancellation Note  Patient Details Name: Randall Mccormick MRN: [id number] DOB: [birth year]   Cancelled Treatment:    Reason Eval/Treat Not Completed: Medical issues which prohibited therapy. Pt now intubated and continues to be unresponsive. Pt not appropriate and unable to participate in PT eval at this time. PT SIGNING OFF. Please re-consult if needed in future.  Norene Ames, PT, DPT Acute Rehabilitation Services Secure chat preferred Office #: [phone]    [name] Ames 07/15/2024, 10:51 AM

## 2024-07-16 ENCOUNTER — Inpatient Hospital Stay (HOSPITAL_COMMUNITY): Payer: MEDICARE

## 2024-07-16 DIAGNOSIS — F418 Other specified anxiety disorders: Secondary | ICD-10-CM | POA: Diagnosis not present

## 2024-07-16 DIAGNOSIS — J9690 Respiratory failure, unspecified, unspecified whether with hypoxia or hypercapnia: Secondary | ICD-10-CM | POA: Diagnosis not present

## 2024-07-16 DIAGNOSIS — E785 Hyperlipidemia, unspecified: Secondary | ICD-10-CM | POA: Diagnosis not present

## 2024-07-16 DIAGNOSIS — G8222 Paraplegia, incomplete: Secondary | ICD-10-CM

## 2024-07-16 DIAGNOSIS — F419 Anxiety disorder, unspecified: Secondary | ICD-10-CM | POA: Diagnosis not present

## 2024-07-16 DIAGNOSIS — G9341 Metabolic encephalopathy: Secondary | ICD-10-CM | POA: Diagnosis not present

## 2024-07-16 DIAGNOSIS — A0471 Enterocolitis due to Clostridium difficile, recurrent: Secondary | ICD-10-CM | POA: Diagnosis not present

## 2024-07-16 DIAGNOSIS — A419 Sepsis, unspecified organism: Secondary | ICD-10-CM | POA: Diagnosis not present

## 2024-07-16 DIAGNOSIS — N179 Acute kidney failure, unspecified: Secondary | ICD-10-CM | POA: Diagnosis not present

## 2024-07-16 DIAGNOSIS — F32A Depression, unspecified: Secondary | ICD-10-CM | POA: Diagnosis not present

## 2024-07-16 DIAGNOSIS — R6520 Severe sepsis without septic shock: Secondary | ICD-10-CM | POA: Diagnosis not present

## 2024-07-16 LAB — CULTURE, RESPIRATORY W GRAM STAIN

## 2024-07-16 LAB — CBC
HCT: 44.8 % (ref 39.0–52.0)
Hemoglobin: 15.2 g/dL (ref 13.0–17.0)
MCH: 32.7 pg (ref 26.0–34.0)
MCHC: 33.9 g/dL (ref 30.0–36.0)
MCV: 96.3 fL (ref 80.0–100.0)
Platelets: 279 K/uL (ref 150–400)
RBC: 4.65 MIL/uL (ref 4.22–5.81)
RDW: 18 % — ABNORMAL HIGH (ref 11.5–15.5)
WBC: 38.4 K/uL — ABNORMAL HIGH (ref 4.0–10.5)
nRBC: 0.1 % (ref 0.0–0.2)

## 2024-07-16 LAB — COMPREHENSIVE METABOLIC PANEL WITH GFR
ALT: 38 U/L (ref 0–44)
AST: 68 U/L — ABNORMAL HIGH (ref 15–41)
Albumin: 2.1 g/dL — ABNORMAL LOW (ref 3.5–5.0)
Alkaline Phosphatase: 172 U/L — ABNORMAL HIGH (ref 38–126)
Anion gap: 9 (ref 5–15)
BUN: 45 mg/dL — ABNORMAL HIGH (ref 8–23)
CO2: 22 mmol/L (ref 22–32)
Calcium: 7.2 mg/dL — ABNORMAL LOW (ref 8.9–10.3)
Chloride: 111 mmol/L (ref 98–111)
Creatinine, Ser: 1.04 mg/dL (ref 0.61–1.24)
GFR, Estimated: >60 mL/min
Glucose, Bld: 163 mg/dL — ABNORMAL HIGH (ref 70–99)
Potassium: 3.9 mmol/L (ref 3.5–5.1)
Sodium: 143 mmol/L (ref 135–145)
Total Bilirubin: 0.7 mg/dL (ref 0.0–1.2)
Total Protein: 5.2 g/dL — ABNORMAL LOW (ref 6.5–8.1)

## 2024-07-16 LAB — GLUCOSE, CAPILLARY
Glucose-Capillary: 180 mg/dL — ABNORMAL HIGH (ref 70–99)
Glucose-Capillary: 157 mg/dL — ABNORMAL HIGH (ref 70–99)
Glucose-Capillary: 123 mg/dL — ABNORMAL HIGH (ref 70–99)
Glucose-Capillary: 121 mg/dL — ABNORMAL HIGH (ref 70–99)
Glucose-Capillary: 135 mg/dL — ABNORMAL HIGH (ref 70–99)

## 2024-07-16 LAB — MAGNESIUM: Magnesium: 3.2 mg/dL — ABNORMAL HIGH (ref 1.7–2.4)

## 2024-07-16 LAB — AMYLASE: Amylase: 44 U/L (ref 28–100)

## 2024-07-16 LAB — LACTIC ACID, PLASMA: Lactic Acid, Venous: 1.5 mmol/L (ref 0.5–1.9)

## 2024-07-16 LAB — PROCALCITONIN: Procalcitonin: 1.29 ng/mL

## 2024-07-16 LAB — PHOSPHORUS: Phosphorus: 1.7 mg/dL — ABNORMAL LOW (ref 2.5–4.6)

## 2024-07-16 LAB — LIPASE, BLOOD: Lipase: 57 U/L — ABNORMAL HIGH (ref 11–51)

## 2024-07-16 MED ORDER — VANCOMYCIN 50 MG/ML ORAL SOLUTION
500.0000 mg | Freq: Four times a day (QID) | ORAL | Status: DC
  Administered 2024-07-16 – 2024-07-20 (×16): 500 mg
  Filled 2024-07-16 (×18): qty 10

## 2024-07-16 MED ORDER — METRONIDAZOLE 500 MG/100ML IV SOLN
500.0000 mg | Freq: Three times a day (TID) | INTRAVENOUS | Status: DC
  Administered 2024-07-16 – 2024-07-24 (×24): 500 mg via INTRAVENOUS
  Filled 2024-07-16 (×25): qty 100

## 2024-07-16 MED ORDER — BANATROL TF EN LIQD
60.0000 mL | Freq: Two times a day (BID) | ENTERAL | Status: DC
  Administered 2024-07-16 – 2024-07-17 (×2): 60 mL
  Filled 2024-07-16 (×2): qty 60

## 2024-07-16 MED ORDER — GERHARDT'S BUTT CREAM
1.0000 | TOPICAL_CREAM | Freq: Every day | CUTANEOUS | Status: AC
  Administered 2024-07-16 – 2024-07-20 (×5): 1 via TOPICAL
  Filled 2024-07-16 (×3): qty 60

## 2024-07-16 MED ORDER — ENOXAPARIN SODIUM 60 MG/0.6ML IJ SOSY
50.0000 mg | PREFILLED_SYRINGE | INTRAMUSCULAR | Status: DC
  Administered 2024-07-16 – 2024-07-23 (×8): 50 mg via SUBCUTANEOUS
  Filled 2024-07-16 (×8): qty 0.6

## 2024-07-16 MED ORDER — POLYETHYLENE GLYCOL 3350 17 G PO PACK: 17.0000 g | PACK | Freq: Every day | ORAL | Status: DC | PRN

## 2024-07-16 MED ORDER — OXYCODONE HCL 5 MG PO TABS
15.0000 mg | ORAL_TABLET | Freq: Three times a day (TID) | ORAL | Status: DC
  Administered 2024-07-16 – 2024-07-17 (×3): 15 mg via ORAL
  Filled 2024-07-16 (×3): qty 3

## 2024-07-16 MED ORDER — INSULIN ASPART 100 UNIT/ML IJ SOLN
0.0000 [IU] | INTRAMUSCULAR | Status: DC
  Administered 2024-07-16 (×2): 1 [IU] via SUBCUTANEOUS
  Administered 2024-07-17: 2 [IU] via SUBCUTANEOUS
  Administered 2024-07-17 – 2024-07-23 (×21): 1 [IU] via SUBCUTANEOUS
  Administered 2024-07-23: 2 [IU] via SUBCUTANEOUS
  Administered 2024-07-23 (×2): 1 [IU] via SUBCUTANEOUS
  Administered 2024-07-24: 2 [IU] via SUBCUTANEOUS
  Administered 2024-07-24 (×2): 1 [IU] via SUBCUTANEOUS
  Filled 2024-07-16 (×14): qty 1
  Filled 2024-07-16: qty 2
  Filled 2024-07-16 (×13): qty 1

## 2024-07-16 MED ORDER — POTASSIUM PHOSPHATES 15 MMOLE/5ML IV SOLN
30.0000 mmol | Freq: Once | INTRAVENOUS | Status: AC
  Administered 2024-07-16: 30 mmol via INTRAVENOUS
  Filled 2024-07-16: qty 30

## 2024-07-16 MED ORDER — SENNA 8.6 MG PO TABS: 1.0000 | ORAL_TABLET | Freq: Two times a day (BID) | ORAL | Status: DC | PRN

## 2024-07-16 MED ORDER — FENTANYL BOLUS VIA INFUSION
25.0000 ug | INTRAVENOUS | Status: DC | PRN
  Administered 2024-07-16 (×2): 50 ug via INTRAVENOUS

## 2024-07-16 MED ORDER — SENNA 8.6 MG PO TABS: 1.0000 | ORAL_TABLET | Freq: Two times a day (BID) | ORAL | Status: DC

## 2024-07-16 MED ORDER — FAMOTIDINE 20 MG PO TABS
20.0000 mg | ORAL_TABLET | Freq: Two times a day (BID) | ORAL | Status: DC
  Administered 2024-07-16 – 2024-07-24 (×16): 20 mg
  Filled 2024-07-16 (×17): qty 1

## 2024-07-16 NOTE — Plan of Care (Signed)
" °  Problem: Fluid Volume: Goal: Hemodynamic stability will improve Outcome: Not Progressing   Problem: Clinical Measurements: Goal: Diagnostic test results will improve Outcome: Not Progressing Goal: Signs and symptoms of infection will decrease Outcome: Not Progressing   Problem: Respiratory: Goal: Ability to maintain adequate ventilation will improve Outcome: Not Progressing   Problem: Clinical Measurements: Goal: Ability to maintain clinical measurements within normal limits will improve Outcome: Not Progressing Goal: Will remain free from infection Outcome: Not Progressing Goal: Diagnostic test results will improve Outcome: Not Progressing Goal: Respiratory complications will improve Outcome: Not Progressing Goal: Cardiovascular complication will be avoided Outcome: Not Progressing   Problem: Activity: Goal: Risk for activity intolerance will decrease Outcome: Not Progressing   Problem: Nutrition: Goal: Adequate nutrition will be maintained Outcome: Not Progressing   Problem: Coping: Goal: Level of anxiety will decrease Outcome: Not Progressing   Problem: Safety: Goal: Ability to remain free from injury will improve Outcome: Not Progressing   Problem: Skin Integrity: Goal: Risk for impaired skin integrity will decrease Outcome: Not Progressing   Problem: Elimination: Goal: Will not experience complications related to bowel motility Outcome: Not Progressing Goal: Will not experience complications related to urinary retention Outcome: Not Progressing   Problem: Pain Managment: Goal: General experience of comfort will improve and/or be controlled Outcome: Not Progressing   "

## 2024-07-16 NOTE — Progress Notes (Addendum)
 Nutrition Follow-up  DOCUMENTATION CODES:  Not applicable  INTERVENTION:  Continue tube feeding via NGT: Vital 1.5 at 60 ml/h (1440 ml per day) Prosource TF20 60 ml 1x/d Provides 2240 kcal, 117 gm protein, 1100 ml free water  daily Exchange NGT for cortrak tube Banatrol BID-provides 45kcal, 5g soluble fiber and 2g protein per serving. Ok to use with Clostridium difficile infection per manufacturer   NUTRITION DIAGNOSIS:  Inadequate oral intake related to inability to eat as evidenced by NPO status. - remains applicable  GOAL:  Patient will meet greater than or equal to 90% of their needs - progressing, TF at goal  MONITOR:  TF tolerance, Vent status, I & O's, Labs  REASON FOR ASSESSMENT:  Ventilator, Consult Enteral/tube feeding initiation and management  ASSESSMENT:  Pt with hx of HTN, HLD, incomplete paraplegia with neurogenic bowel/bladder,   presented to ED with fever, bloody diarrhea, and AMS. Found to be positive for Clostridium difficile after multiple recent rounds of antibiotics.  1/16 - presented to ED 1/21 - intubated, enteral feeds initiated   Pt resting in bed at the time of assessment. Remains intubated, no sedation at this time, but pt not following commands. RN reports that pt will withdrawal to pain but otherwise not responding. Continues to have output, likely in part due to Clostridium difficile infection but now pt is also receiving nutrition. Will add fiber to add bulk to stool.  Pt discussed during ICU rounds and with RN and MD. Pt has been slow to wake up in the past, currently just allowing time for outcomes. Discussed exchanging NGT for cortrak for higher level of pt comfort and more secure attachment. Order placed.    MV: 14.5 L/min Temp (24hrs), Avg:99.3 F (37.4 C), Min:99 F (37.2 C), Max:99.9 F (37.7 C) MAP (cuff): 66-89 mmHg this AM  Admit weight: 99.8 kg  Current weight: 106.1 kg   Intake/Output Summary (Last 24 hours) at 07/16/2024  1534 Last data filed at 07/16/2024 1400 Gross per 24 hour  Intake 2166.13 ml  Output 1475 ml  Net 691.13 ml  Net IO Since Admission: 3,264.6 mL [07/16/24 1534]  Drains/Lines: CVC Triple Lumen, left IJ FMS x 24 hours UOP x 24 hours NGT (gastric)  Nutritionally Relevant Medications: Scheduled Meds:  ezetimibe   10 mg Per Tube QHS   famotidine   20 mg Per Tube BID   PROSource TF20  60 mL Per Tube Daily   insulin  aspart  0-9 Units Subcutaneous Q4H   rosuvastatin   5 mg Per Tube Daily   vancomycin   500 mg Per Tube Q6H   Continuous Infusions:  feeding supplement (VITAL 1.5 CAL) 50 mL/hr at 07/16/24 1400   metronidazole      thiamine  (VITAMIN B1) injection 500 mg (07/16/24 1446)   PRN Meds: ondansetron   Labs Reviewed: BUN 45 Phosphorus 1.7 Magnesium  3.2 Calcium  corrects to 8.72 for low albumin, low CBG ranges from 123-180 mg/dL over the last 24 hours HgbA1c 5.3% (09/11/23)  NUTRITION - FOCUSED PHYSICAL EXAM: Flowsheet Row Most Recent Value  Orbital Region Mild depletion  Upper Arm Region No depletion  Thoracic and Lumbar Region No depletion  Buccal Region Unable to assess  [ETT holder]  Temple Region Mild depletion  Clavicle Bone Region No depletion  Clavicle and Acromion Bone Region No depletion  Scapular Bone Region Mild depletion  Dorsal Hand No depletion  Patellar Region No depletion  Anterior Thigh Region No depletion  Posterior Calf Region No depletion  Edema (RD Assessment) Moderate  [pitting  to the right lower leg]  Hair Reviewed  Eyes Reviewed  Mouth Reviewed  Skin Reviewed  Nails Reviewed    Diet Order:   Diet Order     None       EDUCATION NEEDS:  Education needs have been addressed  Skin:  Skin Assessment: Reviewed RN Assessment  Last BM:  1/23 - type 7 via FMS FMS placed 1/17  Height:  Ht Readings from Last 1 Encounters:  07/14/24 5' 8 (1.727 m)    Weight:  Wt Readings from Last 1 Encounters:  07/15/24 106.1 kg     Ideal Body Weight:  63.6 kg  BMI:  Body mass index is 35.57 kg/m.  Estimated Nutritional Needs:  Kcal:  2100-2300 kcal/d Protein:  105-120g/d Fluid:  2.2 L/d    Vernell Lukes, RD, LDN, CNSC Registered Dietitian II Please reach out via secure chat

## 2024-07-16 NOTE — Progress Notes (Addendum)
 "  NAME:  Randall Mccormick, MRN:  [id number], DOB:  May 07, 1956, LOS: 7 ADMISSION DATE:  07/09/2024, CONSULTATION DATE:  07/14/24 REFERRING MD: None , CHIEF COMPLAINT:  Respiratory Decline   History of Present Illness:  Patient is a Randall Mccormick is a 69 y.o. 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  In the ED, patient was tachycardic with fever.  Labs showed creatinine of 1.0.  Lactate of 2.3.  Total bilirubin of 2.6.  COVID influenza and RSV was negative.  Chest x-ray showed cardiomegaly.  CT scan of the abdomen pelvis showed fluid throughout the colon consistent with diarrhea/enteritis. He was tested positive for C. Diff and was being treated with PO vancomycin .  On 1/20 PCCM was consulted to evaluate the pt for not being responsive and having increased WOB. Pt was transitioned from [place] to Bipap. PCCM recommended pt being monitored on the floor as he was tolerating bipap well and was maintaining his airway, with low threshold to move to ICU if need be.   On 1/21 pt had worsening respiratory status and and abnormal ABG requiring intubation. Repeat ABG showed an improvement in Pco2, and O2 levels.  Pertinent  Medical History  Per above   Significant Hospital Events: Including procedures, antibiotic start and stop dates in addition to other pertinent events   1/16: admitted  1/20: Became less responsive with increased WOB 1/21: Intubated and admitted to ICU for decline in respiratory status   Interim History / Subjective:  Saw pt at bedside. Opens eyes. Responding to noxious stimuli. Doesn't follow commands   Objective    Blood pressure 124/71, pulse 82, temperature 99 F (37.2 C), temperature source Axillary, resp. rate (!) 26, height 5' 8 (1.727 m), weight 106.1 kg, SpO2 98%.     Vent Mode: PRVC FiO2 (%):  [50 %-60 %] 50 % Set Rate:  [26 bmp] 26 bmp Vt Set:  [550 mL] 550 mL PEEP:  [8 cmH20] 8 cmH20 Plateau Pressure:  [22 cmH20-24 cmH20] 24 cmH20   Intake/Output Summary (Last 24 hours) at 07/16/2024 0743 Last data filed at 07/16/2024 0600 Gross per 24 hour  Intake 1398.33 ml  Output 1475 ml  Net -76.67 ml   Filed Weights   07/14/24 0500 07/15/24 0708  Weight: 99.8 kg 106.1 kg    Examination: General: opens eyes, not following commands  HENT: [place]/ AT Lungs: coarse breath sounds Cardiovascular: RRR Abdomen: distended but soft  Neuro: opens eyes, responds to painful stimuli, not following commands   Resolved problem list   Assessment and Plan   Neuro Encephalopathy 2/2 metabolic, infectious causes Respiratory decline 1/20, improving - Pt's mentation has improved. He moved his head once on either side when requested by RN ON. However, not following commands. Will continue to observe and wean sedation as tolerated. Currently only on fentanyl   -Apparently in the past, he had encephalopathy and did not wake up easily. Will hold on re-consulting neuro as mentation has improved.  -Will continue on vent for now and plan for removal once mentation has improved significantly.  -Family was keen on discussing goals of care with palliative, if pt's mentation did not improve.   -Scheduled Oxycodone  per below -Continue on vent -PAD  with propofol  and fentanyl   -Wean sedation as able  -SBT when able  -Vancomycin , per below -Consider palliative care consult   Incomplete paraplegia with neurogenic bladder AKI  likely 2/2 to sepsis per below  Good urine OP. Bun/ Cr levels normalized.  -Monitor Cr/Bun level daily  -Hold home baclofen  and gabapentin    Respiratory Respiratory decline 1/20, improving ?Respiratory Infection  -Will continue on vent, per above.  -Will continue with zosyn  as discussed below  Infectious  Severe sepsis due to C. difficile  enteritis, POA ?Respiratory Infection -Stool output has increased since yesterday.  Will continue tx with vancomycin .  - prelim resp cx do show some gram negative rods, cocci.  -WBC trending up likeley 2/2 to c.diff but respiratory source cannot be excluded due to resp. Cx findings.  -Continue vancomycin  per tube 500 mg 4 times daily to complete 10-day course -Continue zosyn : day 2    Cardiac Hemodynamically stable. On low dose of levophed   -Wean pressors, as tolerated  Hyperlipidemia: On rosuvastatin  and Zetia .  GI Per above   -Start Protonix  GI prophylaxis   Hyperbilirubinemia Likely secondary to sepsis per above.  Total bilirubin levels wnl this AM.   Nutrition Tube feeds.   Endo  Appropriate glucose levels.   -Trend glucose daily   Renal Incomplete paraplegia with neurogenic bladder AKI likely 2/2 to sepsis per below  Good urine OP. Bun/ Cr levels normalized.  -Monitor Cr/Bun level daily  -Hold home baclofen  and gabapentin    Heme Elevated WBC count, per above.   Anticoagulation: lovenox  40 mg daily Reason:DVT prophylaxis   MSK/Other  Depression/anxiety: Continue home Prozac , Zyprexa , and Xanax     Chronic pain: Continue home Oxy IR 15 mg every 8 hours for severe pain and agitation.     Gout: Continue allopurinol .     Labs   CBC: Recent Labs  Lab 07/09/24 1830 07/09/24 1845 07/12/24 0816 07/13/24 0138 07/13/24 0526 07/13/24 9167 07/14/24 0400 07/14/24 0646 07/14/24 0850 07/15/24 0401 07/16/24 0317  WBC 21.7*   < > 27.2*  --  23.8*  --   --   --  19.1* 29.5* 38.4*  NEUTROABS 18.0*  --   --   --   --   --   --   --   --   --   --   HGB 16.4   < > 17.9*   < > 17.3*   < > 17.0 16.7 15.1 15.6 15.2  HCT 51.1   < > 54.3*   < > 52.2*   < > 50.0 49.0 45.7 46.0 44.8  MCV 100.4*   < > 97.7  --  96.5  --   --   --  98.3 95.6 96.3  PLT 227   < > 238  --  251  --   --   --  209 233 279   < > = values in this interval not displayed.    Basic  Metabolic Panel: Recent Labs  Lab 07/10/24 0406 07/11/24 0244 07/13/24 0526 07/13/24 9167 07/14/24 0646 07/14/24 0836 07/14/24 1132 07/15/24 0401 07/16/24 0317  NA 137   < > 132*   < > 137 136 137 140 143  K 3.8   < > 5.0   < > 4.7 6.0* 4.4 4.5 3.9  CL 101   < > 99  --   --  103 102 106 111  CO2 25   < > 21*  --   --  20* 21* 21* 22  GLUCOSE 118*   < > 125*  --   --  95 90 147* 163*  BUN 16   < > 65*  --   --  68* 68* 62* 45*  CREATININE 0.94   < > 2.44*  --   --  2.15* 1.98* 1.51* 1.04  CALCIUM  8.4*   < > 7.7*  --   --  7.3* 7.1* 7.2* 7.2*  MG 2.2  --  3.0*  --   --   --  2.8* 3.0* 3.2*  PHOS  --   --   --   --   --   --  3.8 2.3* 1.7*   < > = values in this interval not displayed.   GFR: Estimated Creatinine Clearance: 79.2 mL/min (by C-G formula based on SCr of 1.04 mg/dL). Recent Labs  Lab 07/09/24 1846 07/09/24 2158 07/10/24 0406 07/13/24 0526 07/14/24 0850 07/14/24 1132 07/14/24 1448 07/15/24 0401 07/16/24 0317  WBC  --   --    < > 23.8* 19.1*  --   --  29.5* 38.4*  LATICACIDVEN 2.3* 1.0  --   --   --  1.4 1.3  --   --    < > = values in this interval not displayed.    Liver Function Tests: Recent Labs  Lab 07/12/24 0816 07/14/24 0836 07/14/24 1132 07/15/24 0401 07/16/24 0317  AST 41 78* 80* 79* 68*  ALT 25 34 35 41 38  ALKPHOS 168* 122 122 153* 172*  BILITOT 0.8 0.8 0.7 0.6 0.7  PROT 5.3* 5.0* 4.8* 5.2* 5.2*  ALBUMIN 2.2* 2.1* 2.1* 2.2* 2.1*   No results for input(s): LIPASE, AMYLASE in the last 168 hours. Recent Labs  Lab 07/13/24 1115  AMMONIA 35    ABG    Component Value Date/Time   PHART 7.263 (L) 07/14/2024 0646   PCO2ART 52.8 (H) 07/14/2024 0646   PO2ART 81 (L) 07/14/2024 0646   HCO3 23.9 07/14/2024 0646   TCO2 25 07/14/2024 0646   ACIDBASEDEF 4.0 (H) 07/14/2024 0646   O2SAT 94 07/14/2024 0646     Coagulation Profile: Recent Labs  Lab 07/10/24 0406  INR 1.5*    Cardiac Enzymes: No results for input(s): CKTOTAL,  CKMB, CKMBINDEX, TROPONINI in the last 168 hours.  HbA1C: Hgb A1c MFr Bld  Date/Time Value Ref Range Status  09/11/2023 03:44 AM 5.3 4.8 - 5.6 % Final    Comment:    (NOTE) Pre diabetes:          5.7%-6.4%  Diabetes:              >6.4%  Glycemic control for   <7.0% adults with diabetes   04/06/2022 04:58 PM 5.3 4.8 - 5.6 % Final    Comment:    (NOTE) Pre diabetes:          5.7%-6.4%  Diabetes:              >6.4%  Glycemic control for   <7.0% adults with diabetes     CBG: Recent Labs  Lab 07/15/24 1158 07/15/24 1556 07/15/24 2009 07/15/24 2347 07/16/24 0402  GLUCAP 142* 161* 150* 138* 180*    Review of Systems:   Per above   Past Medical History:  He,  has a past medical history of Abnormal gait due to muscle weakness (03/28/2021), Acute blood loss anemia, Acute encephalopathy (04/05/2022), Acute on chronic anemia (04/07/2022), Acute respiratory failure with hypoxia (HCC) (04/07/2022), AKI (acute kidney injury), Anxiety, Arthritis of knee (03/28/2022), 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) (03/29/2022), Aspiration into airway (05/17/2024), Aspiration pneumonia (HCC) (04/12/2022), BMI 35.0-35.9,adult (03/02/2014), C/O Cirrhosis. Hx of daily Alcohol consumption (05/15/2024), Chronic pain (  12/24/2023), Chronic pain syndrome, Cobalamin deficiency, Constipation, Cord compression (HCC) (07/20/2020), Degeneration of lumbar or lumbosacral intervertebral disc, Degenerative lumbar spinal stenosis (11/23/2019), Depression, Difficulty walking (12/11/2022), Disorder of spinal region [facility]) (12/11/2022), Displacement of lumbar intervertebral disc without myelopathy (03/02/2014), Dupuytren's contracture of right hand (03/17/2019), Esophageal dysphagia (05/17/2024), Foot-drop (12/11/2022), Gouty arthropathy, Hematemesis with nausea, Hemorrhoids, Hyperkalemia, Hyperlipidemia, Hypertension, Hypoalbuminemia due to protein-calorie malnutrition, Incomplete paraplegia (HCC)  (09/08/2020), Low back pain (05/12/2023), Lumbago with sciatica, left side (03/22/2024), Lumbago with sciatica, right side (03/22/2024), Lumbar spondylosis (11/03/2013), Mitral regurgitation (03/23/2024), Moderate alcohol consumption (03/23/2024), Multiple fractures of ribs of left side (04/20/2013), Muscle atrophy (12/11/2022), Neurogenic bladder, Neurogenic bowel, Neuropathic pain, OA (osteoarthritis) of knee (03/28/2022), Osteoarthritis of right hip (01/07/2014), 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   Surgical History:   Past Surgical History:  Procedure Laterality Date   APPENDECTOMY     BACK SURGERY     x 6   CHOLECYSTECTOMY  06/24/2008   [facility]   ESOPHAGOGASTRODUODENOSCOPY N/A 04/06/2022   Procedure: ESOPHAGOGASTRODUODENOSCOPY (EGD);  Surgeon: Legrand Victory LITTIE DOUGLAS, MD;  Location: [facility] ENDOSCOPY;  Service: Gastroenterology;  Laterality: N/A;   FOOT SURGERY Right 05/2024   TONSILLECTOMY     TOTAL KNEE ARTHROPLASTY Right 03/28/2022   Procedure: RIGHT TOTAL KNEE ARTHROPLASTY;  Surgeon: Addie Cordella Hamilton, MD;  Location[facility] OR;  Service: Orthopedics;  Laterality: Right;     Social History:   reports that he has never smoked. He quit smokeless tobacco use about 4 years ago.  His smokeless tobacco use included chew. He reports current alcohol use of about 1.0 standard drink of alcohol per week. He reports that he does  not currently use drugs.   Family History:  His family history includes Alcoholism in his father; Cancer in his mother and son; Heart disease in his father.   Allergies Allergies[1]   Home Medications  Prior to Admission medications  Medication Sig Start Date End Date Taking? Authorizing Provider  albuterol  (PROVENTIL ) (2.5 MG/3ML) 0.083% nebulizer solution Take 2.5 mg by nebulization every 6 (six) hours as needed for wheezing or shortness of breath.   Yes [provider]  allopurinol  (ZYLOPRIM ) 300 MG tablet Take 300 mg by mouth daily. 05/12/24  Yes [provider]  ALPRAZolam  (XANAX ) 1 MG tablet Take 1 mg by mouth 3 (three) times daily as needed for anxiety.   Yes [provider]  amLODipine  (NORVASC ) 2.5 MG tablet Take 2.5 mg by mouth daily.   Yes [provider]  aspirin  EC 81 MG tablet Take 81 mg by mouth daily. Swallow whole.   Yes [provider]  baclofen  (LIORESAL ) 10 MG tablet Take 5 mg by mouth daily. 06/12/24  Yes [provider]  ezetimibe  (ZETIA ) 10 MG tablet Take 10 mg by mouth at bedtime. 04/29/20  Yes [provider]  FLUoxetine  (PROZAC ) 40 MG capsule Take 40 mg by mouth daily. 03/22/24  Yes [provider]  fluticasone  (FLONASE ) 50 MCG/ACT nasal spray Place 1 spray into both nostrils 2 (two) times daily as needed for allergies or rhinitis.   Yes [provider]  folic acid  (FOLVITE ) 1 MG tablet  Take 1 mg by mouth daily. 07/29/23  Yes [provider]  gabapentin  (NEURONTIN ) 300 MG capsule Take 300 mg by mouth 2 (two) times daily. 04/13/24  Yes [provider]  OLANZapine  (ZYPREXA ) 2.5 MG tablet Take 2.5 mg by mouth at bedtime.   Yes [provider]  oxyCODONE  (ROXICODONE ) 15 MG immediate release tablet Take 15 mg by mouth every 8 (eight) hours as needed for pain. 04/07/24  Yes [provider]  pantoprazole  (PROTONIX ) 40 MG tablet Take 40 mg by mouth daily. 08/25/23   Yes [provider]  polyethylene glycol (MIRALAX  / GLYCOLAX ) 17 g packet Take 17 g by mouth 3 (three) times daily. Patient taking differently: Take 17 g by mouth daily. May take 1 additional tablet as needed for constipation 12/27/23  Yes Ghimire, Donalda HERO, MD  Prenatal Vit-Fe Fumarate-FA (PRENATAL VITAMIN PO) Take 1 tablet by mouth daily.   Yes [provider]  rosuvastatin  (CRESTOR ) 5 MG tablet Take 1 tablet (5 mg total) by mouth daily. 03/23/24 07/09/25 Yes Madireddy, Alean SAUNDERS, MD  solifenacin (VESICARE) 10 MG tablet Take 10 mg by mouth daily. 07/01/24  Yes [provider]  thiamine  (VITAMIN B1) 100 MG tablet Take 100 mg by mouth daily.   Yes [provider]  VENTOLIN  HFA 108 (90 Base) MCG/ACT inhaler Inhale 1-2 puffs into the lungs every 4 (four) hours as needed for wheezing or shortness of breath. 08/22/23  Yes [provider]  tamsulosin  (FLOMAX ) 0.4 MG CAPS capsule Take 1 capsule (0.4 mg total) by mouth daily after supper. Patient not taking: Reported on 07/09/2024 12/27/23   Raenelle Donalda HERO, MD     Critical care time: 54    Rebecka Pion, DO IM PGY1            [1] No Known Allergies  "

## 2024-07-16 NOTE — Progress Notes (Signed)
 [facility] ADULT ICU REPLACEMENT PROTOCOL   The patient does apply for the [facility] Adult ICU Electrolyte Replacment Protocol based on the criteria listed below:   1.Exclusion criteria: TCTS, ECMO, Dialysis, and Myasthenia Gravis patients 2. Is GFR >/= 30 ml/min? Yes.    Patient's GFR today is >60 3. Is SCr </= 2? Yes.   Patient's SCr is 1.04 mg/dL 4. Did SCr increase >/= 0.5 in 24 hours? No. 5.Pt's weight >40kg  Yes.   6. Abnormal electrolyte(s):   Phos 1.7  7. Electrolytes replaced per protocol 8.  Call MD STAT for K+ </= 2.5, Phos </= 1, or Mag </= 1 Physician:  DOROTHA Claudene Blackbird R Evelynne Spiers 07/16/2024 5:33 AM

## 2024-07-16 NOTE — Procedures (Signed)
 Cortrak  Person Inserting Tube:  [place] Madia, Olivia SAUNDERS, RD Tube Type:  Cortrak - 43 inches Tube Size:  10 Tube Location:  Left nare Secured by: Bridle Initial Placement:  Gastric Technique Used to Measure Tube Placement:  Marking at nare/corner of mouth Cortrak Secured At:  74 cm Initial Placement Verification:  Cortrak device (Registered Dieticians Only)  Cortrak Tube Team Note:  Consult received to place a Cortrak feeding tube.   No x-ray is required. RN may begin using tube.   If the tube becomes dislodged please keep the tube and contact the Cortrak team at [email or web address] for replacement.  If after hours and replacement cannot be delayed, place a NG tube and confirm placement with an abdominal x-ray.    Olivia Kenning, RD Registered Dietitian  See Amion for more information

## 2024-07-17 DIAGNOSIS — G9341 Metabolic encephalopathy: Secondary | ICD-10-CM | POA: Diagnosis not present

## 2024-07-17 DIAGNOSIS — G8222 Paraplegia, incomplete: Secondary | ICD-10-CM | POA: Diagnosis not present

## 2024-07-17 DIAGNOSIS — E785 Hyperlipidemia, unspecified: Secondary | ICD-10-CM | POA: Diagnosis not present

## 2024-07-17 DIAGNOSIS — F419 Anxiety disorder, unspecified: Secondary | ICD-10-CM | POA: Diagnosis not present

## 2024-07-17 DIAGNOSIS — F32A Depression, unspecified: Secondary | ICD-10-CM | POA: Diagnosis not present

## 2024-07-17 DIAGNOSIS — N179 Acute kidney failure, unspecified: Secondary | ICD-10-CM | POA: Diagnosis not present

## 2024-07-17 DIAGNOSIS — A0471 Enterocolitis due to Clostridium difficile, recurrent: Secondary | ICD-10-CM | POA: Diagnosis not present

## 2024-07-17 DIAGNOSIS — A419 Sepsis, unspecified organism: Secondary | ICD-10-CM | POA: Diagnosis not present

## 2024-07-17 DIAGNOSIS — F418 Other specified anxiety disorders: Secondary | ICD-10-CM | POA: Diagnosis not present

## 2024-07-17 DIAGNOSIS — R6520 Severe sepsis without septic shock: Secondary | ICD-10-CM | POA: Diagnosis not present

## 2024-07-17 DIAGNOSIS — J9690 Respiratory failure, unspecified, unspecified whether with hypoxia or hypercapnia: Secondary | ICD-10-CM | POA: Diagnosis not present

## 2024-07-17 LAB — COMPREHENSIVE METABOLIC PANEL WITH GFR
ALT: 72 U/L — ABNORMAL HIGH (ref 0–44)
AST: 156 U/L — ABNORMAL HIGH (ref 15–41)
Albumin: 2 g/dL — ABNORMAL LOW (ref 3.5–5.0)
Alkaline Phosphatase: 241 U/L — ABNORMAL HIGH (ref 38–126)
Anion gap: 6 (ref 5–15)
BUN: 26 mg/dL — ABNORMAL HIGH (ref 8–23)
CO2: 25 mmol/L (ref 22–32)
Calcium: 7 mg/dL — ABNORMAL LOW (ref 8.9–10.3)
Chloride: 114 mmol/L — ABNORMAL HIGH (ref 98–111)
Creatinine, Ser: 0.85 mg/dL (ref 0.61–1.24)
GFR, Estimated: >60 mL/min
Glucose, Bld: 133 mg/dL — ABNORMAL HIGH (ref 70–99)
Potassium: 4.5 mmol/L (ref 3.5–5.1)
Sodium: 146 mmol/L — ABNORMAL HIGH (ref 135–145)
Total Bilirubin: 0.6 mg/dL (ref 0.0–1.2)
Total Protein: 5 g/dL — ABNORMAL LOW (ref 6.5–8.1)

## 2024-07-17 LAB — GLUCOSE, CAPILLARY
Glucose-Capillary: 104 mg/dL — ABNORMAL HIGH (ref 70–99)
Glucose-Capillary: 116 mg/dL — ABNORMAL HIGH (ref 70–99)
Glucose-Capillary: 120 mg/dL — ABNORMAL HIGH (ref 70–99)
Glucose-Capillary: 125 mg/dL — ABNORMAL HIGH (ref 70–99)
Glucose-Capillary: 128 mg/dL — ABNORMAL HIGH (ref 70–99)
Glucose-Capillary: 132 mg/dL — ABNORMAL HIGH (ref 70–99)
Glucose-Capillary: 152 mg/dL — ABNORMAL HIGH (ref 70–99)

## 2024-07-17 LAB — CBC
HCT: 43.2 % (ref 39.0–52.0)
Hemoglobin: 14.2 g/dL (ref 13.0–17.0)
MCH: 32.1 pg (ref 26.0–34.0)
MCHC: 32.9 g/dL (ref 30.0–36.0)
MCV: 97.5 fL (ref 80.0–100.0)
Platelets: 277 10*3/uL (ref 150–400)
RBC: 4.43 MIL/uL (ref 4.22–5.81)
RDW: 18.5 % — ABNORMAL HIGH (ref 11.5–15.5)
WBC: 38.9 10*3/uL — ABNORMAL HIGH (ref 4.0–10.5)
nRBC: 0.2 % (ref 0.0–0.2)

## 2024-07-17 LAB — LACTIC ACID, PLASMA: Lactic Acid, Venous: 1.5 mmol/L (ref 0.5–1.9)

## 2024-07-17 LAB — PHOSPHORUS: Phosphorus: 1.7 mg/dL — ABNORMAL LOW (ref 2.5–4.6)

## 2024-07-17 LAB — MAGNESIUM: Magnesium: 3.1 mg/dL — ABNORMAL HIGH (ref 1.7–2.4)

## 2024-07-17 MED ORDER — MIDAZOLAM HCL (PF) 2 MG/2ML IJ SOLN: 1.0000 mg | INTRAMUSCULAR | Status: DC | PRN

## 2024-07-17 MED ORDER — MIDAZOLAM HCL (PF) 2 MG/2ML IJ SOLN
1.0000 mg | INTRAMUSCULAR | Status: DC | PRN
  Administered 2024-07-19: 2 mg via INTRAVENOUS
  Filled 2024-07-17 (×2): qty 2

## 2024-07-17 MED ORDER — FENTANYL CITRATE (PF) 50 MCG/ML IJ SOSY
50.0000 ug | PREFILLED_SYRINGE | INTRAMUSCULAR | Status: DC | PRN
  Administered 2024-07-17: 100 ug via INTRAVENOUS
  Administered 2024-07-17 – 2024-07-19 (×3): 50 ug via INTRAVENOUS
  Filled 2024-07-17: qty 2
  Filled 2024-07-17 (×3): qty 1

## 2024-07-17 MED ORDER — SODIUM PHOSPHATES 45 MMOLE/15ML IV SOLN
30.0000 mmol | Freq: Once | INTRAVENOUS | Status: AC
  Administered 2024-07-17: 30 mmol via INTRAVENOUS
  Filled 2024-07-17: qty 10

## 2024-07-17 MED ORDER — CALCIUM GLUCONATE-NACL 2-0.675 GM/100ML-% IV SOLN
2.0000 g | Freq: Once | INTRAVENOUS | Status: AC
  Administered 2024-07-17: 2000 mg via INTRAVENOUS
  Filled 2024-07-17: qty 100

## 2024-07-17 MED ORDER — ACETAMINOPHEN 160 MG/5ML PO SOLN
ORAL | Status: AC
  Filled 2024-07-17: qty 20.3

## 2024-07-17 NOTE — Progress Notes (Addendum)
 "  NAME:  Randall Mccormick, MRN:  [id number], DOB:  [birth year], LOS: 8 ADMISSION DATE:  07/09/2024, CONSULTATION DATE:  07/14/24 REFERRING MD: None , CHIEF COMPLAINT:  Respiratory Decline   History of Present Illness:  Patient is a Randall Mccormick is a 69 y.o. 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  In the ED, patient was tachycardic with fever.  Labs showed creatinine of 1.0.  Lactate of 2.3.  Total bilirubin of 2.6.  COVID influenza and RSV was negative.  Chest x-ray showed cardiomegaly.  CT scan of the abdomen pelvis showed fluid throughout the colon consistent with diarrhea/enteritis. He was tested positive for C. Diff and was being treated with PO vancomycin .  On 1/20 PCCM was consulted to evaluate the pt for not being responsive and having increased WOB. Pt was transitioned from [place] to Bipap. PCCM recommended pt being monitored on the floor as he was tolerating bipap well and was maintaining his airway, with low threshold to move to ICU if need be.   On 1/21 pt had worsening respiratory status and and abnormal ABG requiring intubation. Repeat ABG showed an improvement in Pco2, and O2 levels.  Pertinent  Medical History  Per above   Significant Hospital Events: Including procedures, antibiotic start and stop dates in addition to other pertinent events   1/16: admitted  Blood culture negative flu and COVID PCR negative Stool C. difficile antigen and toxin positive 1/20: Became less responsive with increased WOB 1/21: Intubated and admitted to ICU for decline in respiratory status  Respiratory culture with moderate Candida albicans MRSA PCR negative 1/23 - Saw pt at bedside. Opens eyes. Responding to noxious stimuli. Doesn't follow commands  Copious diarrhea:  Changed from oral  vancomycin  to higher dose oral vancomycin  associated with IV Flagyl  Lactate and abdominal x-ray within normal limits. Core track left naris introduced yesterday  Interim History / Subjective:   1/24: Remains on ventilator 50% FiO2.  On tube feeds through core track.  On fentanyl  infusion.  On low-dose Levophed  infusion.  Renal function holding.  Phosphorus very low.  Fever still present.?  Plateauing.  White count 30,900 and slightly higher but?  Plateauing.  WYUA - pending SBT - pending Levophed   STil with diarrhe aper RN - but unclear if better 250cc overnight per RN    Objective    Blood pressure 99/61, pulse 73, temperature (!) 100.9 F (38.3 C), temperature source Axillary, resp. rate (!) 26, height 5' 8 (1.727 m), weight 100.2 kg, SpO2 94%.    Vent Mode: PRVC FiO2 (%):  [40 %-50 %] 50 % Set Rate:  [26 bmp] 26 bmp Vt Set:  [550 mL] 550 mL PEEP:  [8 cmH20] 8 cmH20 Plateau Pressure:  [8 cmH20-26 cmH20] 22 cmH20   Intake/Output Summary (Last 24 hours) at 07/17/2024 0857 Last data filed at 07/17/2024 9348 Gross per 24 hour  Intake 2450.41 ml  Output 1850 ml  Net 600.41 ml   Filed Weights   07/14/24 0500 07/15/24 0708 07/17/24 0551  Weight: 99.8 kg 106.1 kg 100.2 kg    Examination:    General Appearance:  Looks criticall ill Head:  Normocephalic, without obvious abnormality, atraumatic Eyes:  PERRL - yes, conjunctiva/corneas - muddy     Ears:  Normal external ear canals, both ears Nose:  G tube - yes Throat:  ETT TUBE - yes , OG tube -  no Neck:  Supple,  No enlargement/tenderness/nodules Lungs: Clear to auscultation bilaterally, Ventilator   Synchrony - yes Heart:  S1 and S2 normal, no murmur, CVP - no.  Pressors - levophed  Abdomen:  Soft, no masses, no organomegaly Genitalia / Rectal:  Not done Extremities:  Extremities- intact Skin:  ntact in exposed areas . Sacral area - not examined Neurologic:  Sedation - Fent gtt -> RASS - -3 . Moves all 4s -  cannot test. CAM-ICU - canot test . Orientation - no     Resolved problem list   Assessment and Plan   Neuro dEopression on Prozac , Xanax  prn and Zygprexa scheduleds Chronic pain - on oxy Chronic gout -0n allopurinoil Hx of in-hospital prolonged encephalopathy Encephalopathy 2/2 metabolic, infectious causes  - Apparently in the past, he had encephalopathy and did not wake up easily   1/2 :: RASS -4 v -3 on fent gtt  PLAN  - dc scheduled Oxycodone  per below -> resart when more awake - dc schedule fentanyl  - do fent prn  -do versed  prn  - ok for diprivan  gtt  - RASS goal 0 to -2 -Continue on vent  - hold allopurnol - continue PRozac  + Zyprexa   Incomplete paraplegia with neurogenic bladder  -uses walker at home. Baseline medical issue  Plan -Holding home baclofen  and gabapentin     Respiratory Respiratory decline 1/20,   intubated 07/14/24 Acute resp failure   07/17/2024 - > does NOT meet criteria for SBT/Extubation in setting of Acute Respiratory Failure due to obtundatiion, shock , c diff  PLAN -Will continue on vent, per above.  -Will continue with zosyn  as discussed below  Infectious  Severe sepsis due to C. difficile enteritis, POA  1/25 - ?>  [place] of severe c diff worsening  Plan - IV Flagyl  since 07/16/24  -Continue vancomycin  per tube 500 mg 4 times daily to complete 10-day course - no zosynb   Cardiac CIRCULATORY SHOCK   1/24:   On low dose of levophed   PLAN - MAP goal > 65 -Wean pressors, as tolerated  Hyperlipidemia: On rosuvastatin  and Zetia .  GI sUP  -Start Protonix  GI prophylaxis   Hyperbilirubinemia - Likely secondary to sepsis per above.   1/24 : LFT worsne  Plan  - recheck   Renal Incomplete paraplegia with neurogenic bladder on vesicare , flomax  as thome AKI likely 2/2 to sepsis per below   1/24: Good urine OP. Bun/ Cr levels normalized.  PLAN -Monitor Cr/Bun level daily  -Hold home baclofen  and gabapentin        Depression/anxiety: Continue home Prozac , Zyprexa , and Xanax     Chronic pain: Continue home Oxy IR 15 mg every 8 hours for severe pain and agitation.     Gout: Continue allopurinol .     Nutrition Tube feeds.   Endo  Appropriate glucose levels.   -Trend glucose daily   Anticoagulation:  - lovenox  40 mg daily - Reason:DVT prophylaxis   Family  07/17/24: Wife Ms. Campton updated  ATTESTATION & SIGNATURE   The patient Peighton Edgin is critically ill with multiple organ systems failure and requires high complexity decision making for assessment and support, frequent evaluation and titration of therapies, application of advanced monitoring technologies and extensive interpretation of multiple databases and discussion with other appropriate health care personnel such as bedside nurses, social workers, case production designer, theatre/television/film, consultants, respiratory therapists, nutritionists, secretaries etc.,  Critical care time includes but is not restricted to just documentation time. Documentation can happen in parallel or sequential to  care time depending on case mix urgency and priorities for the shift. So, overall critical Care Time devoted to patient care services described in this note is  35  Minutes.   This time reflects time of care of this signee Dr Dorethia Cave which includ does not reflect procedure time, or teaching time or supervisory time of PA/NP/Med student/Med Resident etc but could involve care discussion time     Dr. Dorethia Cave, M.D., [facility].C.P Pulmonary and Critical Care Medicine Staff Physician, [facility] [place] [facility] Pager: [phone], If no answer or between  15:00h - 7:00h: call [phone]  07/17/2024 8:58 AM    LABS    PULMONARY Recent Labs  Lab 07/13/24 0138 07/13/24 0832 07/14/24 0400 07/14/24 0646  PHART 7.326* 7.335* 7.243* 7.263*  PCO2ART 44.7 42.9 60.3* 52.8*  PO2ART 62* 81* 56* 81*  HCO3 23.1 22.7 26.1  23.9  TCO2 24 24 28 25   O2SAT 88 94 83 94    CBC Recent Labs  Lab 07/15/24 0401 07/16/24 0317 07/17/24 0450  HGB 15.6 15.2 14.2  HCT 46.0 44.8 43.2  WBC 29.5* 38.4* 38.9*  PLT 233 279 277    COAGULATION No results for input(s): INR in the last 168 hours.  CARDIAC  No results for input(s): TROPONINI in the last 168 hours. No results for input(s): PROBNP in the last 168 hours.   CHEMISTRY Recent Labs  Lab 07/13/24 0526 07/13/24 9167 07/14/24 0836 07/14/24 1132 07/15/24 0401 07/16/24 0317 07/17/24 0450  NA 132*   < > 136 137 140 143 146*  K 5.0   < > 6.0* 4.4 4.5 3.9 4.5  CL 99  --  103 102 106 111 114*  CO2 21*  --  20* 21* 21* 22 25  GLUCOSE 125*  --  95 90 147* 163* 133*  BUN 65*  --  68* 68* 62* 45* 26*  CREATININE 2.44*  --  2.15* 1.98* 1.51* 1.04 0.85  CALCIUM  7.7*  --  7.3* 7.1* 7.2* 7.2* 7.0*  MG 3.0*  --   --  2.8* 3.0* 3.2* 3.1*  PHOS  --   --   --  3.8 2.3* 1.7* 1.7*   < > = values in this interval not displayed.   Estimated Creatinine Clearance: 94.1 mL/min (by C-G formula based on SCr of 0.85 mg/dL).   LIVER Recent Labs  Lab 07/14/24 0836 07/14/24 1132 07/15/24 0401 07/16/24 0317 07/17/24 0450  AST 78* 80* 79* 68* 156*  ALT 34 35 41 38 72*  ALKPHOS 122 122 153* 172* 241*  BILITOT 0.8 0.7 0.6 0.7 0.6  PROT 5.0* 4.8* 5.2* 5.2* 5.0*  ALBUMIN 2.1* 2.1* 2.2* 2.1* 2.0*     INFECTIOUS Recent Labs  Lab 07/14/24 1448 07/16/24 1437 07/17/24 0450  LATICACIDVEN 1.3 1.5 1.5  PROCALCITON  --  1.29  --      ENDOCRINE CBG (last 3)  Recent Labs    07/17/24 0012 07/17/24 0409 07/17/24 0835  GLUCAP 152* 116* 125*         IMAGING x48h  - image(s) personally visualized  -   highlighted in bold DG Abd 1 View Result Date: 07/16/2024 EXAM: 1 VIEW XRAY OF THE ABDOMEN 07/16/2024 04:39:00 PM COMPARISON: 12/24/2023 CLINICAL HISTORY: Toxic megacolon. FINDINGS: LINES, TUBES AND DEVICES: Enteric feeding tube in place with tip projecting  over the distal stomach. BOWEL: Nonobstructive bowel gas pattern. SOFT TISSUES: Right upper quadrant surgical clips noted. No  abnormal calcifications. BONES: Extensive thoracic and lumbar spinal surgical hardware noted. Old healed left rib fractures. No acute fracture. IMPRESSION: 1. Enteric feeding tube tip projects over the distal stomach. Electronically signed by: Oneil Devonshire MD 07/16/2024 07:30 PM EST RP Workstation: [id number]    "

## 2024-07-17 NOTE — Progress Notes (Signed)
 [facility] ADULT ICU REPLACEMENT PROTOCOL   The patient does apply for the [facility] Adult ICU Electrolyte Replacment Protocol based on the criteria listed below:   1.Exclusion criteria: TCTS, ECMO, Dialysis, and Myasthenia Gravis patients 2. Is GFR >/= 30 ml/min? Yes.    Patient's GFR today is >60 3. Is SCr </= 2? Yes.   Patient's SCr is 0.85 mg/dL 4. Did SCr increase >/= 0.5 in 24 hours? No. 5.Pt's weight >40kg  Yes.   6. Abnormal electrolyte(s):   Phos 1.7  7. Electrolytes replaced per protocol 8.  Call MD STAT for K+ </= 2.5, Phos </= 1, or Mag </= 1 Physician:  DOROTHA Claudene Blackbird R Aman Batley 07/17/2024 5:58 AM

## 2024-07-18 DIAGNOSIS — F32A Depression, unspecified: Secondary | ICD-10-CM | POA: Diagnosis not present

## 2024-07-18 DIAGNOSIS — R6520 Severe sepsis without septic shock: Secondary | ICD-10-CM | POA: Diagnosis not present

## 2024-07-18 DIAGNOSIS — F419 Anxiety disorder, unspecified: Secondary | ICD-10-CM | POA: Diagnosis not present

## 2024-07-18 DIAGNOSIS — E785 Hyperlipidemia, unspecified: Secondary | ICD-10-CM | POA: Diagnosis not present

## 2024-07-18 DIAGNOSIS — A0471 Enterocolitis due to Clostridium difficile, recurrent: Secondary | ICD-10-CM | POA: Diagnosis not present

## 2024-07-18 DIAGNOSIS — J9690 Respiratory failure, unspecified, unspecified whether with hypoxia or hypercapnia: Secondary | ICD-10-CM | POA: Diagnosis not present

## 2024-07-18 DIAGNOSIS — G9341 Metabolic encephalopathy: Secondary | ICD-10-CM | POA: Diagnosis not present

## 2024-07-18 DIAGNOSIS — F418 Other specified anxiety disorders: Secondary | ICD-10-CM | POA: Diagnosis not present

## 2024-07-18 DIAGNOSIS — G8222 Paraplegia, incomplete: Secondary | ICD-10-CM | POA: Diagnosis not present

## 2024-07-18 DIAGNOSIS — A419 Sepsis, unspecified organism: Secondary | ICD-10-CM | POA: Diagnosis not present

## 2024-07-18 DIAGNOSIS — N179 Acute kidney failure, unspecified: Secondary | ICD-10-CM | POA: Diagnosis not present

## 2024-07-18 LAB — COMPREHENSIVE METABOLIC PANEL WITH GFR
ALT: 57 U/L — ABNORMAL HIGH (ref 0–44)
AST: 85 U/L — ABNORMAL HIGH (ref 15–41)
Albumin: 1.9 g/dL — ABNORMAL LOW (ref 3.5–5.0)
Alkaline Phosphatase: 246 U/L — ABNORMAL HIGH (ref 38–126)
Anion gap: 7 (ref 5–15)
BUN: 23 mg/dL (ref 8–23)
CO2: 27 mmol/L (ref 22–32)
Calcium: 7.2 mg/dL — ABNORMAL LOW (ref 8.9–10.3)
Chloride: 118 mmol/L — ABNORMAL HIGH (ref 98–111)
Creatinine, Ser: 0.79 mg/dL (ref 0.61–1.24)
GFR, Estimated: >60 mL/min
Glucose, Bld: 121 mg/dL — ABNORMAL HIGH (ref 70–99)
Potassium: 4.7 mmol/L (ref 3.5–5.1)
Sodium: 151 mmol/L — ABNORMAL HIGH (ref 135–145)
Total Bilirubin: 0.5 mg/dL (ref 0.0–1.2)
Total Protein: 4.9 g/dL — ABNORMAL LOW (ref 6.5–8.1)

## 2024-07-18 LAB — PHOSPHORUS: Phosphorus: 2.5 mg/dL (ref 2.5–4.6)

## 2024-07-18 LAB — GLUCOSE, CAPILLARY
Glucose-Capillary: 127 mg/dL — ABNORMAL HIGH (ref 70–99)
Glucose-Capillary: 119 mg/dL — ABNORMAL HIGH (ref 70–99)
Glucose-Capillary: 134 mg/dL — ABNORMAL HIGH (ref 70–99)
Glucose-Capillary: 124 mg/dL — ABNORMAL HIGH (ref 70–99)
Glucose-Capillary: 103 mg/dL — ABNORMAL HIGH (ref 70–99)
Glucose-Capillary: 124 mg/dL — ABNORMAL HIGH (ref 70–99)

## 2024-07-18 LAB — TRIGLYCERIDES: Triglycerides: 74 mg/dL

## 2024-07-18 LAB — CBC
HCT: 42.6 % (ref 39.0–52.0)
Hemoglobin: 14.1 g/dL (ref 13.0–17.0)
MCH: 32.2 pg (ref 26.0–34.0)
MCHC: 33.1 g/dL (ref 30.0–36.0)
MCV: 97.3 fL (ref 80.0–100.0)
Platelets: 296 10*3/uL (ref 150–400)
RBC: 4.38 MIL/uL (ref 4.22–5.81)
RDW: 18.6 % — ABNORMAL HIGH (ref 11.5–15.5)
WBC: 35.9 10*3/uL — ABNORMAL HIGH (ref 4.0–10.5)
nRBC: 0.1 % (ref 0.0–0.2)

## 2024-07-18 LAB — CK TOTAL AND CKMB (NOT AT ARMC)
CK, MB: <1 ng/mL (ref 0.5–5.0)
Total CK: 21 U/L — ABNORMAL LOW (ref 49–397)

## 2024-07-18 LAB — MAGNESIUM: Magnesium: 2.8 mg/dL — ABNORMAL HIGH (ref 1.7–2.4)

## 2024-07-18 MED ORDER — FREE WATER
300.0000 mL | Status: DC
  Administered 2024-07-18 – 2024-07-20 (×12): 300 mL

## 2024-07-18 MED ORDER — CALCIUM GLUCONATE-NACL 2-0.675 GM/100ML-% IV SOLN
2.0000 g | Freq: Once | INTRAVENOUS | Status: AC
  Administered 2024-07-18: 2000 mg via INTRAVENOUS
  Filled 2024-07-18: qty 100

## 2024-07-18 MED ORDER — THIAMINE MONONITRATE 100 MG PO TABS
100.0000 mg | ORAL_TABLET | Freq: Every day | ORAL | Status: DC
  Administered 2024-07-18 – 2024-07-24 (×7): 100 mg
  Filled 2024-07-18 (×7): qty 1

## 2024-07-18 MED ORDER — ALTEPLASE 2 MG IJ SOLR
2.0000 mg | Freq: Once | INTRAMUSCULAR | Status: AC
  Administered 2024-07-18: 2 mg

## 2024-07-18 NOTE — Progress Notes (Addendum)
 "  NAME:  Randall Mccormick, MRN:  [id number], DOB:  [birth year], LOS: 9 ADMISSION DATE:  07/09/2024, CONSULTATION DATE:  07/14/24 REFERRING MD: None , CHIEF COMPLAINT:  Respiratory Decline   History of Present Illness:  Patient is a Randall Mccormick is a 69 y.o. 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  In the ED, patient was tachycardic with fever.  Labs showed creatinine of 1.0.  Lactate of 2.3.  Total bilirubin of 2.6.  COVID influenza and RSV was negative.  Chest x-ray showed cardiomegaly.  CT scan of the abdomen pelvis showed fluid throughout the colon consistent with diarrhea/enteritis. He was tested positive for C. Diff and was being treated with PO vancomycin .  On 1/20 PCCM was consulted to evaluate the pt for not being responsive and having increased WOB. Pt was transitioned from [place] to Bipap. PCCM recommended pt being monitored on the floor as he was tolerating bipap well and was maintaining his airway, with low threshold to move to ICU if need be.   On 1/21 pt had worsening respiratory status and and abnormal ABG requiring intubation. Repeat ABG showed an improvement in Pco2, and O2 levels.  Pertinent  Medical History  Per above   Significant Hospital Events: Including procedures, antibiotic start and stop dates in addition to other pertinent events   1/16: admitted  Blood culture negative flu and COVID PCR negative Stool C. difficile antigen and toxin positive 1/20: Became less responsive with increased WOB 1/21: Intubated and admitted to ICU for decline in respiratory status  Respiratory culture with moderate Candida albicans MRSA PCR negative 1/23 - Saw pt at bedside. Opens eyes. Responding to noxious stimuli. Doesn't follow commands  Copious diarrhea:  Changed from oral  vancomycin  to higher dose oral vancomycin  associated with IV Flagyl  Lactate and abdominal x-ray within normal limits. Core track left naris introduced yesterday  1/24: Remains on ventilator 50% FiO2.  On tube feeds through core track.  On fentanyl  infusion.  On low-dose Levophed  infusion.  Renal function holding.  Phosphorus very low.  Fever still present.?  Plateauing.  White count 30,900 and slightly higher but?  Plateauing. Turned all sedation gtt off. STil with diarrhe aper RN - but unclear if better 250cc overnight per RN  Interim History / Subjective:   1/25: 40% fio2 on vent.  On TF. WBC down to 36K ,. .fever curve likely improving NAs 151 rising.  LFT better.  NOt on pressors. Colro better per friend at bedside. MAnaging on prn sedation x 24h  Objective    Blood pressure 106/65, pulse 93, temperature 99.8 F (37.7 C), temperature source Axillary, resp. rate (!) 26, height 5' 8 (1.727 m), weight 100 kg, SpO2 97%.    Vent Mode: PRVC FiO2 (%):  [40 %] 40 % Set Rate:  [26 bmp] 26 bmp Vt Set:  [550 mL] 550 mL PEEP:  [8 cmH20] 8 cmH20 Plateau Pressure:  [21 cmH20-23 cmH20] 21 cmH20   Intake/Output Summary (Last 24 hours) at 07/18/2024 1252 Last data filed at 07/18/2024 0600 Gross per 24 hour  Intake 1942.88 ml  Output 2150 ml  Net -207.12 ml   Filed Weights   07/15/24 0708 07/17/24 0551 07/18/24 0500  Weight: 106.1 kg 100.2 kg 100 kg    Examination:   General Appearance:  Looks criticall ill OBESE - + Head:  Normocephalic, without obvious abnormality, atraumatic Eyes:  PERRL - yes, conjunctiva/corneas - mud     Ears:  Normal external ear canals, both ears Nose:  G tube - yes Throat:  ETT TUBE - yes , OG tube - x Neck:  Supple,  No enlargement/tenderness/nodules Lungs: Clear to auscultation bilaterally, Ventilator   Synchrony - yes Heart:  S1 and S2 normal, no murmur, CVP - no.  Pressors - OFF Abdomen:  Soft, no masses, no organomegaly Genitalia / Rectal:  Not  done Extremities:  Extremities- intact, edema  + , boots + Skin:  ntact in exposed areas . Sacral area - not examined Neurologic:  Sedation - prn -> RASS - -3 right now . Moves all 4s - yes he can. CAM-ICU - x . Orientation - not       Resolved problem list   Assessment and Plan   Neuro dEopression on Prozac , Xanax  prn and Zygprexa scheduleds Chronic pain - on oxy Chronic gout -0n allopurinoil Hx of in-hospital prolonged encephalopathy Encephalopathy 2/2 metabolic, infectious causes  - Apparently in the past, he had encephalopathy and did not wake up easily  - According to best friend 07/18/2024: At baseline he is hard to wake up anyway   1/25 :: RASS -3 and synchronous on the ventilator on sedation as needed.  Scheduled sedation medication stopped in 07/17/2024   PLAN - do fent prn  -do versed  prn  - ok for diprivan  gtt if needed  - RASS goal 0 to -2 -Continue on vent   - continue PRozac  + Zyprexa   Incomplete paraplegia with neurogenic bladder  -uses walker at home. Baseline medical issue  Plan -Holding home baclofen  and gabapentin      Respiratory decline 1/20,   intubated 07/14/24 Acute resp failure   07/18/2024 - > does NOT meet criteria for SBT/Extubation in setting of Acute Respiratory Failure due to obtundatiion, and C. Difficile   PLAN PRVC - VAP bundle  Infectious  Severe sepsis due to C. difficile enteritis, POA  1/26 - ?  White count better, fever curve better, diarrhea better but still all abnormal and ongoing diarrhea  Plan - IV Flagyl  since 07/16/24  -Continue vancomycin  per tube 500 mg 4 times daily [dose escalated on 07/16/2024] - no zosyn     CIRCULATORY SHOCK   - Resolved 07/18/2024   PLAN - MAP goal > 65 -Wean pressors, as tolerated  Hyperlipidemia: On rosuvastatin  and Zetia .  GI SUP  Plan - Protonix  GI prophylaxis   Hyperbilirubinemia and shock liver transaminitis- Likely secondary to sepsis per above.   07/18/2024: LFT  improving  Plan  - recheck as needed    Incomplete paraplegia with neurogenic bladder on vesicare , flomax  as thome AKI likely 2/2 to sepsis per below   1/25: Good urine OP. Bun/ Cr levels normalized.  PLAN -Monitor Cr/Bun level daily  -Hold home baclofen  and gabapentin    Hypernatremia 07/18/24  Plan  -s tart free water   Gout: Continue allopurinol .     Nutrition Tube feeds.   Endo  Appropriate glucose levels.   -Trend glucose daily   Anticoagulation:  - lovenox  40 mg daily - Reason:DVT prophylaxis   Family  07/17/24: Wife Ms. Stefanski updated 1/25/2026BETHA Helling  Robb [phone] - updated  ATTESTATION & SIGNATURE   The patient Quinterious Walraven is critically ill with multiple organ systems failure and requires high complexity decision making for assessment and support, frequent evaluation and titration of therapies, application of advanced monitoring technologies and extensive interpretation of multiple databases and discussion  with other appropriate health care personnel such as bedside nurses, social workers, case production designer, theatre/television/film, consultants, respiratory therapists, nutritionists, secretaries etc.,  Critical care time includes but is not restricted to just documentation time. Documentation can happen in parallel or sequential to care time depending on case mix urgency and priorities for the shift. So, overall critical Care Time devoted to patient care services described in this note is  35  Minutes.   This time reflects time of care of this signee Dr Dorethia Cave which includ does not reflect procedure time, or teaching time or supervisory time of PA/NP/Med student/Med Resident etc but could involve care discussion time     Dr. Dorethia Cave, M.D., [facility].C.P Pulmonary and Critical Care Medicine Staff Physician,  [facility] [place] [facility]monary and Critical Care Pager: [phone], If no answer or between  15:00h - 7:00h: call [phone]  07/18/2024 12:52  PM    LABS    PULMONARY Recent Labs  Lab 07/13/24 0138 07/13/24 0832 07/14/24 0400 07/14/24 0646  PHART 7.326* 7.335* 7.243* 7.263*  PCO2ART 44.7 42.9 60.3* 52.8*  PO2ART 62* 81* 56* 81*  HCO3 23.1 22.7 26.1 23.9  TCO2 24 24 28 25   O2SAT 88 94 83 94    CBC Recent Labs  Lab 07/16/24 0317 07/17/24 0450 07/18/24 0430  HGB 15.2 14.2 14.1  HCT 44.8 43.2 42.6  WBC 38.4* 38.9* 35.9*  PLT 279 277 296    COAGULATION No results for input(s): INR in the last 168 hours.  CARDIAC  No results for input(s): TROPONINI in the last 168 hours. No results for input(s): PROBNP in the last 168 hours.   CHEMISTRY Recent Labs  Lab 07/14/24 1132 07/15/24 0401 07/16/24 0317 07/17/24 0450 07/18/24 0430  NA 137 140 143 146* 151*  K 4.4 4.5 3.9 4.5 4.7  CL 102 106 111 114* 118*  CO2 21* 21* 22 25 27   GLUCOSE 90 147* 163* 133* 121*  BUN 68* 62* 45* 26* 23  CREATININE 1.98* 1.51* 1.04 0.85 0.79  CALCIUM  7.1* 7.2* 7.2* 7.0* 7.2*  MG 2.8* 3.0* 3.2* 3.1* 2.8*  PHOS 3.8 2.3* 1.7* 1.7* 2.5   Estimated Creatinine Clearance: 99.8 mL/min (by C-G formula based on SCr of 0.79 mg/dL).   LIVER Recent Labs  Lab 07/14/24 1132 07/15/24 0401 07/16/24 0317 07/17/24 0450 07/18/24 0430  AST 80* 79* 68* 156* 85*  ALT 35 41 38 72* 57*  ALKPHOS 122 153* 172* 241* 246*  BILITOT 0.7 0.6 0.7 0.6 0.5  PROT 4.8* 5.2* 5.2* 5.0* 4.9*  ALBUMIN 2.1* 2.2* 2.1* 2.0* 1.9*     INFECTIOUS Recent Labs  Lab 07/14/24 1448 07/16/24 1437 07/17/24 0450  LATICACIDVEN 1.3 1.5 1.5  PROCALCITON  --  1.29  --      ENDOCRINE CBG (last 3)  Recent Labs    07/18/24 0322 07/18/24 0804 07/18/24 1111  GLUCAP 127* 119* 134*         IMAGING x48h  - image(s) personally visualized  -   highlighted in bold DG Abd 1 View Result Date: 07/16/2024 EXAM: 1 VIEW XRAY OF THE ABDOMEN 07/16/2024 04:39:00 PM COMPARISON: 12/24/2023 CLINICAL HISTORY: Toxic megacolon. FINDINGS: LINES, TUBES AND DEVICES:  Enteric feeding tube in place with tip projecting over the distal stomach. BOWEL: Nonobstructive bowel gas pattern. SOFT TISSUES: Right upper quadrant surgical clips noted. No abnormal calcifications. BONES: Extensive thoracic and lumbar spinal surgical hardware noted. Old healed left rib fractures. No acute fracture. IMPRESSION: 1. Enteric  feeding tube tip projects over the distal stomach. Electronically signed by: Oneil Devonshire MD 07/16/2024 07:30 PM EST RP Workstation: [id number]    "

## 2024-07-19 ENCOUNTER — Inpatient Hospital Stay (HOSPITAL_COMMUNITY): Payer: MEDICARE

## 2024-07-19 DIAGNOSIS — A0471 Enterocolitis due to Clostridium difficile, recurrent: Secondary | ICD-10-CM | POA: Diagnosis not present

## 2024-07-19 DIAGNOSIS — G9341 Metabolic encephalopathy: Secondary | ICD-10-CM | POA: Diagnosis not present

## 2024-07-19 DIAGNOSIS — G8220 Paraplegia, unspecified: Secondary | ICD-10-CM

## 2024-07-19 DIAGNOSIS — R4182 Altered mental status, unspecified: Secondary | ICD-10-CM | POA: Diagnosis not present

## 2024-07-19 DIAGNOSIS — J9600 Acute respiratory failure, unspecified whether with hypoxia or hypercapnia: Secondary | ICD-10-CM

## 2024-07-19 DIAGNOSIS — R569 Unspecified convulsions: Secondary | ICD-10-CM | POA: Diagnosis not present

## 2024-07-19 DIAGNOSIS — F32A Depression, unspecified: Secondary | ICD-10-CM | POA: Diagnosis not present

## 2024-07-19 DIAGNOSIS — N179 Acute kidney failure, unspecified: Secondary | ICD-10-CM | POA: Diagnosis not present

## 2024-07-19 DIAGNOSIS — E870 Hyperosmolality and hypernatremia: Secondary | ICD-10-CM | POA: Diagnosis not present

## 2024-07-19 LAB — COMPREHENSIVE METABOLIC PANEL WITH GFR
ALT: 41 U/L (ref 0–44)
AST: 55 U/L — ABNORMAL HIGH (ref 15–41)
Albumin: 1.8 g/dL — ABNORMAL LOW (ref 3.5–5.0)
Alkaline Phosphatase: 191 U/L — ABNORMAL HIGH (ref 38–126)
Anion gap: 6 (ref 5–15)
BUN: 26 mg/dL — ABNORMAL HIGH (ref 8–23)
CO2: 28 mmol/L (ref 22–32)
Calcium: 7.4 mg/dL — ABNORMAL LOW (ref 8.9–10.3)
Chloride: 114 mmol/L — ABNORMAL HIGH (ref 98–111)
Creatinine, Ser: 0.87 mg/dL (ref 0.61–1.24)
GFR, Estimated: >60 mL/min
Glucose, Bld: 125 mg/dL — ABNORMAL HIGH (ref 70–99)
Potassium: 4.9 mmol/L (ref 3.5–5.1)
Sodium: 148 mmol/L — ABNORMAL HIGH (ref 135–145)
Total Bilirubin: 0.5 mg/dL (ref 0.0–1.2)
Total Protein: 5 g/dL — ABNORMAL LOW (ref 6.5–8.1)

## 2024-07-19 LAB — MAGNESIUM: Magnesium: 2.7 mg/dL — ABNORMAL HIGH (ref 1.7–2.4)

## 2024-07-19 LAB — GLUCOSE, CAPILLARY
Glucose-Capillary: 105 mg/dL — ABNORMAL HIGH (ref 70–99)
Glucose-Capillary: 107 mg/dL — ABNORMAL HIGH (ref 70–99)
Glucose-Capillary: 124 mg/dL — ABNORMAL HIGH (ref 70–99)
Glucose-Capillary: 129 mg/dL — ABNORMAL HIGH (ref 70–99)
Glucose-Capillary: 132 mg/dL — ABNORMAL HIGH (ref 70–99)
Glucose-Capillary: 140 mg/dL — ABNORMAL HIGH (ref 70–99)

## 2024-07-19 LAB — POCT I-STAT 7, (LYTES, BLD GAS, ICA,H+H)
Acid-Base Excess: 1 mmol/L (ref 0.0–2.0)
Bicarbonate: 26.7 mmol/L (ref 20.0–28.0)
Calcium, Ion: 1.09 mmol/L — ABNORMAL LOW (ref 1.15–1.40)
HCT: 46 % (ref 39.0–52.0)
Hemoglobin: 15.6 g/dL (ref 13.0–17.0)
O2 Saturation: 94 %
Patient temperature: 101.2
Potassium: 4.9 mmol/L (ref 3.5–5.1)
Sodium: 149 mmol/L — ABNORMAL HIGH (ref 135–145)
TCO2: 28 mmol/L (ref 22–32)
pCO2 arterial: 48.1 mmHg — ABNORMAL HIGH (ref 32–48)
pH, Arterial: 7.359 (ref 7.35–7.45)
pO2, Arterial: 81 mmHg — ABNORMAL LOW (ref 83–108)

## 2024-07-19 LAB — CBC
HCT: 43.2 % (ref 39.0–52.0)
Hemoglobin: 14.1 g/dL (ref 13.0–17.0)
MCH: 32.3 pg (ref 26.0–34.0)
MCHC: 32.6 g/dL (ref 30.0–36.0)
MCV: 98.9 fL (ref 80.0–100.0)
Platelets: 324 10*3/uL (ref 150–400)
RBC: 4.37 MIL/uL (ref 4.22–5.81)
RDW: 18.6 % — ABNORMAL HIGH (ref 11.5–15.5)
WBC: 33.8 10*3/uL — ABNORMAL HIGH (ref 4.0–10.5)
nRBC: 0 % (ref 0.0–0.2)

## 2024-07-19 LAB — PHOSPHORUS: Phosphorus: 2.9 mg/dL (ref 2.5–4.6)

## 2024-07-19 MED ORDER — DEXMEDETOMIDINE HCL IN NACL 400 MCG/100ML IV SOLN: 0.0000 ug/kg/h | INTRAVENOUS | Status: DC

## 2024-07-19 NOTE — Progress Notes (Signed)
 "  NAME:  Randall Mccormick, MRN:  [id number], DOB:  [birth year], LOS: 10 ADMISSION DATE:  07/09/2024, CONSULTATION DATE:  07/14/24 REFERRING MD: None , CHIEF COMPLAINT:  Respiratory Decline   History of Present Illness:  Patient is a Randall Mccormick is a 69 y.o. 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  In the ED, patient was tachycardic with fever.  Labs showed creatinine of 1.0.  Lactate of 2.3.  Total bilirubin of 2.6.  COVID influenza and RSV was negative.  Chest x-ray showed cardiomegaly.  CT scan of the abdomen pelvis showed fluid throughout the colon consistent with diarrhea/enteritis. He was tested positive for C. Diff and was being treated with PO vancomycin .  On 1/20 PCCM was consulted to evaluate the pt for not being responsive and having increased WOB. Pt was transitioned from [place] to Bipap. PCCM recommended pt being monitored on the floor as he was tolerating bipap well and was maintaining his airway, with low threshold to move to ICU if need be.   On 1/21 pt had worsening respiratory status and and abnormal ABG requiring intubation. Repeat ABG showed an improvement in Pco2, and O2 levels.  Pertinent  Medical History  Per above   Significant Hospital Events: Including procedures, antibiotic start and stop dates in addition to other pertinent events   1/16: admitted  Blood culture negative flu and COVID PCR negative Stool C. difficile antigen and toxin positive 1/20: Became less responsive with increased WOB 1/21: Intubated and admitted to ICU for decline in respiratory status  Respiratory culture with moderate Candida albicans MRSA PCR negative 1/23 - Saw pt at bedside. Opens eyes. Responding to noxious stimuli. Doesn't follow commands  Copious diarrhea:  Changed from oral  vancomycin  to higher dose oral vancomycin  associated with IV Flagyl  Lactate and abdominal x-ray within normal limits. Core track left naris introduced yesterday  1/24: Remains on ventilator 50% FiO2.  On tube feeds through core track.  On fentanyl  infusion.  On low-dose Levophed  infusion.  Renal function holding.  Phosphorus very low.  Fever still present.?  Plateauing.  White count 30,900 and slightly higher but?  Plateauing. Turned all sedation gtt off. STil with diarrhe aper RN - but unclear if better 250cc overnight per RN  Interim History / Subjective:   Still not really waking up much Did get additional versed  + fent  Objective    Blood pressure 126/74, pulse (!) 104, temperature (!) 101.2 F (38.4 C), temperature source Axillary, resp. rate (!) 29, height 5' 8 (1.727 m), weight 100.4 kg, SpO2 93%.    Vent Mode: PSV;CPAP FiO2 (%):  [40 %] 40 % Set Rate:  [26 bmp] 26 bmp Vt Set:  [550 mL] 550 mL PEEP:  [8 cmH20] 8 cmH20 Plateau Pressure:  [21 cmH20-22 cmH20] 21 cmH20   Intake/Output Summary (Last 24 hours) at 07/19/2024 1435 Last data filed at 07/19/2024 1147 Gross per 24 hour  Intake 3297.18 ml  Output 2450 ml  Net 847.18 ml   Filed Weights   07/17/24 0551 07/18/24 0500 07/19/24 0414  Weight: 100.2 kg 100 kg 100.4 kg    Examination:  Chronically ill Moving only LUE, no movement on R Unable to move lower ext much per family at baseline Abd soft Mild-moderate anasarca  Labs and imaging reviewed  Resolved problem list   Assessment and Plan   Depression on Prozac , Xanax  prn  and Zygprexa scheduleds Chronic pain - on oxy Chronic gout -0n allopurinoil Hx of in-hospital prolonged encephalopathy Encephalopathy 2/2 metabolic, infectious causes Incomplete paraplegia with neurogenic bladder  -uses walker at home. Baseline medical issue Respiratory decline 1/20,   intubated 07/14/24 Acute resp failure mixed groups 1/2 Severe sepsis due to C. difficile enteritis,  POA Hyperlipidemia: Incomplete paraplegia with neurogenic bladder on vesicare , flomax  as thome AKI likely 2/2 to sepsis per below  Hypernatremia 07/18/24 Gout  Family at bedside, still think much is polypharmacy and critical illness but not moving RUE is concerning  Continue PTA meds as ordered except any CNS acting agents Check EEG and repeat MRI brain/C spine given unilateral appearing paralysis of R upper ext Precedex  may be used PRN Continue PO vanc and IV flagyl  Continue vent for now, too somnolent to extubate  My cc time 31 mins "

## 2024-07-19 NOTE — Progress Notes (Signed)
 EEG complete - results pending

## 2024-07-19 NOTE — Procedures (Signed)
 Patient Name: Randall Mccormick  MRN: [id number]  Epilepsy Attending: Arlin MALVA Krebs  Referring Physician/Provider: Sharps Toribio BROCKS, MD  Date: 07/19/2024 Duration: 22.07 mins  Patient history: 69yo M with ams. EEG to evaluate for seizure  Level of alertness: Awake/ lethargic   AEDs during EEG study: None  Technical aspects: This EEG study was done with scalp electrodes positioned according to the 10-20 International system of electrode placement. Electrical activity was reviewed with band pass filter of 1-70Hz , sensitivity of 7 uV/mm, display speed of 34mm/sec with a 60Hz  notched filter applied as appropriate. EEG data were recorded continuously and digitally stored.  Video monitoring was available and reviewed as appropriate.  Description: EEG showed continuous generalized predominantly 5 to 7 Hz theta slowing admixed with intermittent 2-3hz  delta slowing. Hyperventilation and photic stimulation were not performed.     ABNORMALITY - Continuous slow, generalized  IMPRESSION: This study is suggestive of moderate diffuse encephalopathy. No seizures or epileptiform discharges were seen throughout the recording.  Camiah Humm O Antara Brecheisen

## 2024-07-20 ENCOUNTER — Ambulatory Visit: Payer: MEDICARE | Admitting: Podiatry

## 2024-07-20 DIAGNOSIS — A0471 Enterocolitis due to Clostridium difficile, recurrent: Secondary | ICD-10-CM | POA: Diagnosis not present

## 2024-07-20 DIAGNOSIS — G8220 Paraplegia, unspecified: Secondary | ICD-10-CM | POA: Diagnosis not present

## 2024-07-20 DIAGNOSIS — J9600 Acute respiratory failure, unspecified whether with hypoxia or hypercapnia: Secondary | ICD-10-CM | POA: Diagnosis not present

## 2024-07-20 DIAGNOSIS — F32A Depression, unspecified: Secondary | ICD-10-CM | POA: Diagnosis not present

## 2024-07-20 DIAGNOSIS — E870 Hyperosmolality and hypernatremia: Secondary | ICD-10-CM | POA: Diagnosis not present

## 2024-07-20 DIAGNOSIS — N179 Acute kidney failure, unspecified: Secondary | ICD-10-CM | POA: Diagnosis not present

## 2024-07-20 DIAGNOSIS — G9341 Metabolic encephalopathy: Secondary | ICD-10-CM | POA: Diagnosis not present

## 2024-07-20 LAB — COMPREHENSIVE METABOLIC PANEL WITH GFR
ALT: 29 U/L (ref 0–44)
AST: 40 U/L (ref 15–41)
Albumin: 1.9 g/dL — ABNORMAL LOW (ref 3.5–5.0)
Alkaline Phosphatase: 136 U/L — ABNORMAL HIGH (ref 38–126)
Anion gap: 7 (ref 5–15)
BUN: 26 mg/dL — ABNORMAL HIGH (ref 8–23)
CO2: 27 mmol/L (ref 22–32)
Calcium: 7.4 mg/dL — ABNORMAL LOW (ref 8.9–10.3)
Chloride: 115 mmol/L — ABNORMAL HIGH (ref 98–111)
Creatinine, Ser: 0.75 mg/dL (ref 0.61–1.24)
GFR, Estimated: >60 mL/min
Glucose, Bld: 122 mg/dL — ABNORMAL HIGH (ref 70–99)
Potassium: 4.5 mmol/L (ref 3.5–5.1)
Sodium: 148 mmol/L — ABNORMAL HIGH (ref 135–145)
Total Bilirubin: 0.6 mg/dL (ref 0.0–1.2)
Total Protein: 5 g/dL — ABNORMAL LOW (ref 6.5–8.1)

## 2024-07-20 LAB — GLUCOSE, CAPILLARY
Glucose-Capillary: 133 mg/dL — ABNORMAL HIGH (ref 70–99)
Glucose-Capillary: 116 mg/dL — ABNORMAL HIGH (ref 70–99)
Glucose-Capillary: 139 mg/dL — ABNORMAL HIGH (ref 70–99)
Glucose-Capillary: 136 mg/dL — ABNORMAL HIGH (ref 70–99)
Glucose-Capillary: 133 mg/dL — ABNORMAL HIGH (ref 70–99)
Glucose-Capillary: 117 mg/dL — ABNORMAL HIGH (ref 70–99)

## 2024-07-20 LAB — CBC
HCT: 41.7 % (ref 39.0–52.0)
Hemoglobin: 13.8 g/dL (ref 13.0–17.0)
MCH: 32.2 pg (ref 26.0–34.0)
MCHC: 33.1 g/dL (ref 30.0–36.0)
MCV: 97.4 fL (ref 80.0–100.0)
Platelets: 356 10*3/uL (ref 150–400)
RBC: 4.28 MIL/uL (ref 4.22–5.81)
RDW: 18.2 % — ABNORMAL HIGH (ref 11.5–15.5)
WBC: 28 10*3/uL — ABNORMAL HIGH (ref 4.0–10.5)
nRBC: 0 % (ref 0.0–0.2)

## 2024-07-20 LAB — MAGNESIUM: Magnesium: 2.7 mg/dL — ABNORMAL HIGH (ref 1.7–2.4)

## 2024-07-20 LAB — PHOSPHORUS: Phosphorus: 2.8 mg/dL (ref 2.5–4.6)

## 2024-07-20 LAB — AMMONIA: Ammonia: 21 umol/L (ref 9–35)

## 2024-07-20 MED ORDER — FREE WATER
400.0000 mL | Status: DC
  Administered 2024-07-20 – 2024-07-22 (×11): 400 mL

## 2024-07-20 MED ORDER — VANCOMYCIN 50 MG/ML ORAL SOLUTION
500.0000 mg | Freq: Four times a day (QID) | ORAL | Status: DC
  Administered 2024-07-20 – 2024-07-24 (×16): 500 mg via ORAL
  Filled 2024-07-20 (×19): qty 10

## 2024-07-20 MED ORDER — BANATROL TF EN LIQD
60.0000 mL | Freq: Two times a day (BID) | ENTERAL | Status: DC
  Administered 2024-07-20 – 2024-07-22 (×4): 60 mL
  Filled 2024-07-20 (×4): qty 60

## 2024-07-20 NOTE — Progress Notes (Addendum)
 "  NAME:  Randall Mccormick, MRN:  [id number], DOB:  [birth year], LOS: 11 ADMISSION DATE:  07/09/2024, CONSULTATION DATE:  07/14/24 REFERRING MD: None , CHIEF COMPLAINT:  Respiratory Decline   History of Present Illness:  Patient is a Randall Mccormick is a 69 y.o. 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  In the ED, patient was tachycardic with fever.  Labs showed creatinine of 1.0.  Lactate of 2.3.  Total bilirubin of 2.6.  COVID influenza and RSV was negative.  Chest x-ray showed cardiomegaly.  CT scan of the abdomen pelvis showed fluid throughout the colon consistent with diarrhea/enteritis. He was tested positive for C. Diff and was being treated with PO vancomycin .  On 1/20 PCCM was consulted to evaluate the pt for not being responsive and having increased WOB. Pt was transitioned from  to Bipap. PCCM recommended pt being monitored on the floor as he was tolerating bipap well and was maintaining his airway, with low threshold to move to ICU if need be.   On 1/21 pt had worsening respiratory status and and abnormal ABG requiring intubation. Repeat ABG showed an improvement in Pco2, and O2 levels.  Pertinent  Medical History  Per above   Significant Hospital Events: Including procedures, antibiotic start and stop dates in addition to other pertinent events   1/16: admitted  Blood culture negative flu and COVID PCR negative Stool C. difficile antigen and toxin positive 1/20: Became less responsive with increased WOB 1/21: Intubated and admitted to ICU for decline in respiratory status  Respiratory culture with moderate Candida albicans MRSA PCR negative 1/23 - Saw pt at bedside. Opens eyes. Responding to noxious stimuli. Doesn't follow commands  Copious diarrhea:  Changed from oral  vancomycin  to higher dose oral vancomycin  associated with IV Flagyl  Lactate and abdominal x-ray within normal limits. Core track left naris introduced yesterday  1/24: Remains on ventilator 50% FiO2.  On tube feeds through core track.  On fentanyl  infusion.  On low-dose Levophed  infusion.  Renal function holding.  Phosphorus very low.  Fever still present.?  Plateauing.  White count 30,900 and slightly higher but?  Plateauing. Turned all sedation gtt off. STil with diarrhe aper RN - but unclear if better 250cc overnight per RN  Interim History / Subjective:   Opens eyes; follows commands.   Objective    Blood pressure 126/66, pulse 82, temperature 99.1 F (37.3 C), temperature source Oral, resp. rate (!) 27, height 5' 8 (1.727 m), weight 100 kg, SpO2 99%.    Vent Mode: PRVC FiO2 (%):  [40 %] 40 % Set Rate:  [26 bmp] 26 bmp Vt Set:  [550 mL] 550 mL PEEP:  [8 cmH20] 8 cmH20 Pressure Support:  [8 cmH20] 8 cmH20 Plateau Pressure:  [20 cmH20-22 cmH20] 22 cmH20   Intake/Output Summary (Last 24 hours) at 07/20/2024 9187 Last data filed at 07/20/2024 0700 Gross per 24 hour  Intake 3730 ml  Output 550 ml  Net 3180 ml   Filed Weights   07/18/24 0500 07/19/24 0414 07/20/24 0434  Weight: 100 kg 100.4 kg 100 kg    Examination:  Chronically ill Moving both left and R extremities; able to squeeze my fingers  Wiggled BL toes  Abd soft but distended  Resolved problem list   Assessment and Plan   Depression on Prozac , Xanax  prn and Zygprexa scheduleds Chronic pain - on oxy  Chronic gout -0n allopurinoil Hx of in-hospital prolonged encephalopathy Encephalopathy 2/2 metabolic, infectious causes Incomplete paraplegia with neurogenic bladder  -uses walker at home. Baseline medical issue Respiratory decline 1/20,   intubated 07/14/24 Acute resp failure mixed groups 1/2 Severe sepsis due to C. difficile enteritis, POA Hyperlipidemia: Incomplete paraplegia with neurogenic bladder on  vesicare , flomax  as thome AKI likely 2/2 to sepsis per below  Hypernatremia 07/18/24 Gout  Able to move both UE today; MRI head and cervical spine negative for acute abnormality. Mentation improved. EEG negative for seizures , showed moderate diffuse encephalopathy. Will extubate today.    Continue PTA meds as ordered except any CNS acting agents Continue PO vanc and IV flagyl  Will extubate today Free water  flushes for hypernatremia   Patient Lines/Drains/Airways Status     Active Line/Drains/Airways     Name Placement date Placement time Site Days   Peripheral IV 07/09/24 18 G Right Antecubital 07/09/24  1816  Antecubital  11   Peripheral IV 07/09/24 20 G Anterior;Left;Upper Arm 07/09/24  1858  Arm  11   CVC Triple Lumen 07/14/24 Left Internal jugular 07/14/24  0720  -- 6   External Urinary Catheter 07/11/24  0000  --  9   Fecal Management System 45 mL 07/10/24  1445  -- 10   Airway 7.5 mm 07/14/24  0615  -- 6   Small Bore Feeding Tube 10 Fr. Left nare Marking at nare/corner of mouth 74 cm 07/16/24  1559  Left nare  4   Wound 07/14/24 0535 Pressure Injury Vertebral column Lower Stage 1 -  Intact skin with non-blanchable redness of a localized area usually over a bony prominence. 07/14/24  0535  Vertebral column  6   Wound 07/15/24 1900 Other (Comment) Nose 07/15/24  1900  Nose  5              "

## 2024-07-20 NOTE — Procedures (Signed)
 Extubation Procedure Note  Patient Details:   Name: Randall Mccormick DOB: 11-11-1955 MRN: [id number]   Airway Documentation:  Airway 7.5 mm (Active)  Secured at (cm) 24 cm 07/20/24 1100  Measured From Lips 07/20/24 1100  Secured Location Left 07/20/24 0922  Secured By [employer] 07/20/24 0922  Bite Block Yes 07/20/24 0922  Tube Holder Repositioned Yes 07/20/24 0922  Prone position No 07/20/24 0922  Cuff Pressure (cm H2O) Clear OR 27-39 CmH2O 07/20/24 0922  Site Condition Dry 07/20/24 0922   Vent end date: 07/20/24 Vent end time: 1215  + cuff leak test prior to extubation Evaluation  O2 sats: stable throughout Complications: No apparent complications Patient did tolerate procedure well. Bilateral Breath Sounds: Diminished, Clear   Yes- barely a whisper.  No distress noted, no stridor noted.    [name] Mini 07/20/2024, 12:21 PM

## 2024-07-20 NOTE — Progress Notes (Signed)
 Nutrition Follow-up  DOCUMENTATION CODES:  Not applicable  INTERVENTION:  Continue tube feeding via cortrak: Vital 1.5 at 60 ml/h (1440 ml per day) Prosource TF20 60 ml 1x/d Provides 2240 kcal, 117 gm protein, 1100 ml free water  daily Banatrol BID-provides 45kcal, 5g soluble fiber and 2g protein per serving. Ok to use with Clostridium difficile infection per manufacturer as product solidifies the stool and does not stop gut motility.  Soluble fibers in banana flakes have a gel-like property that helps thicken stool consistency and a prebiotic effect that promotes reabsorption of fluid in the colon to reduce watery diarrhea  NUTRITION DIAGNOSIS:  Inadequate oral intake related to inability to eat as evidenced by NPO status. - remains applicable  GOAL:  Patient will meet greater than or equal to 90% of their needs - progressing, TF at goal  MONITOR:  TF tolerance, Vent status, I & O's, Labs  REASON FOR ASSESSMENT:  Ventilator, Consult Enteral/tube feeding initiation and management  ASSESSMENT:  Pt with hx of HTN, HLD, incomplete paraplegia with neurogenic bowel/bladder,   presented to ED with fever, bloody diarrhea, and AMS. Found to be positive for Clostridium difficile after multiple recent rounds of antibiotics.  1/16 - presented to ED 1/21 - intubated, enteral feeds initiated  1/23 - cortrak placed  Pt resting in bed at the time of assessment. Still intubated, but no sedation at this time and pt opens eyes when his name is called. Two family members present at bedside. Discussed nutrition plan and cortrak feeding tube. Noted that tube could be utilized after extubation to ensure medications and nutrition be provided but that it was not long-term and would be removed as soon as pt was tolerating PO and able to swallow effectively.   Family hopeful that pt will be able to be extubated and note that this is the most awake he has been all week.   Noted, pt with watery stools and  flexiseal in place. Noted that banatrol was discontinued last week after pt only received 2 doses. Reordered today.  MV: 14.6 L/min Temp (24hrs), Avg:100.1 F (37.8 C), Min:99.1 F (37.3 C), Max:101.3 F (38.5 C) MAP (cuff): 73-82 mmHg this AM  Admit weight: 99.8 kg  Current weight: 100.kg   Intake/Output Summary (Last 24 hours) at 07/20/2024 1146 Last data filed at 07/20/2024 1055 Gross per 24 hour  Intake 3009.96 ml  Output 550 ml  Net 2459.96 ml  Net IO Since Admission: 6,820.56 mL [07/20/24 1146]  Drains/Lines: CVC Triple Lumen, left IJ FMS no recorded output x 24 hours UOP x 24 hours Cortrak  Nutritionally Relevant Medications: Scheduled Meds:  ezetimibe   10 mg Per Tube QHS   famotidine   20 mg Per Tube BID   PROSource TF20  60 mL Per Tube Daily   free water   300 mL Per Tube Q4H   insulin  aspart  0-9 Units Subcutaneous Q4H   rosuvastatin   5 mg Per Tube Daily   thiamine   100 mg Per Tube Daily   vancomycin   500 mg Per Tube Q6H   Continuous Infusions:  feeding supplement (VITAL 1.5 CAL) 60 mL/hr at 07/20/24 0700   metronidazole  Stopped (07/20/24 0110)   PRN Meds: ondansetron   Labs Reviewed: Sodium 148, chloride 115 BUN 26 Magnesium  2.7 CBG ranges from 105-140 mg/dL over the last 24 hours HgbA1c 5.3% (09/11/23)  NUTRITION - FOCUSED PHYSICAL EXAM: Flowsheet Row Most Recent Value  Orbital Region Mild depletion  Upper Arm Region No depletion  Thoracic and Lumbar Region  No depletion  Buccal Region Unable to assess  [ETT holder]  Temple Region Mild depletion  Clavicle Bone Region No depletion  Clavicle and Acromion Bone Region No depletion  Scapular Bone Region Mild depletion  Dorsal Hand No depletion  Patellar Region No depletion  Anterior Thigh Region No depletion  Posterior Calf Region No depletion  Edema (RD Assessment) Moderate  [pitting to the right lower leg]  Hair Reviewed  Eyes Reviewed  Mouth Reviewed  Skin Reviewed  Nails Reviewed     Diet Order:   Diet Order     None       EDUCATION NEEDS:  Education needs have been addressed  Skin:  Skin Assessment: Reviewed RN Assessment  Last BM:  1/26, type 7 FMS placed 1/17  Height:  Ht Readings from Last 1 Encounters:  07/14/24 5' 8 (1.727 m)    Weight:  Wt Readings from Last 1 Encounters:  07/20/24 100 kg    Ideal Body Weight:  63.6 kg  BMI:  Body mass index is 33.52 kg/m.  Estimated Nutritional Needs:  Kcal:  2100-2300 kcal/d Protein:  105-120g/d Fluid:  2.2 L/d    Vernell Lukes, RD, LDN, CNSC Registered Dietitian II Please reach out via secure chat

## 2024-07-20 NOTE — Plan of Care (Signed)
" °  Problem: Fluid Volume: Goal: Hemodynamic stability will improve Outcome: Progressing   Problem: Clinical Measurements: Goal: Diagnostic test results will improve Outcome: Progressing   Problem: Respiratory: Goal: Ability to maintain adequate ventilation will improve Outcome: Progressing   Problem: Clinical Measurements: Goal: Ability to maintain clinical measurements within normal limits will improve Outcome: Progressing Goal: Diagnostic test results will improve Outcome: Progressing Goal: Respiratory complications will improve Outcome: Progressing Goal: Cardiovascular complication will be avoided Outcome: Progressing   Problem: Activity: Goal: Risk for activity intolerance will decrease Outcome: Progressing   Problem: Nutrition: Goal: Adequate nutrition will be maintained Outcome: Progressing   Problem: Coping: Goal: Level of anxiety will decrease Outcome: Progressing   Problem: Safety: Goal: Ability to remain free from injury will improve Outcome: Progressing   Problem: Skin Integrity: Goal: Risk for impaired skin integrity will decrease Outcome: Progressing   Problem: Pain Managment: Goal: General experience of comfort will improve and/or be controlled Outcome: Progressing   Problem: Clinical Measurements: Goal: Will remain free from infection Outcome: Not Progressing   Problem: Elimination: Goal: Will not experience complications related to bowel motility Outcome: Not Progressing Goal: Will not experience complications related to urinary retention Outcome: Not Progressing   "

## 2024-07-21 DIAGNOSIS — N179 Acute kidney failure, unspecified: Secondary | ICD-10-CM | POA: Diagnosis not present

## 2024-07-21 DIAGNOSIS — E870 Hyperosmolality and hypernatremia: Secondary | ICD-10-CM | POA: Diagnosis not present

## 2024-07-21 DIAGNOSIS — J9600 Acute respiratory failure, unspecified whether with hypoxia or hypercapnia: Secondary | ICD-10-CM | POA: Diagnosis not present

## 2024-07-21 DIAGNOSIS — A0471 Enterocolitis due to Clostridium difficile, recurrent: Secondary | ICD-10-CM | POA: Diagnosis not present

## 2024-07-21 DIAGNOSIS — F32A Depression, unspecified: Secondary | ICD-10-CM | POA: Diagnosis not present

## 2024-07-21 DIAGNOSIS — G9341 Metabolic encephalopathy: Secondary | ICD-10-CM | POA: Diagnosis not present

## 2024-07-21 DIAGNOSIS — G8220 Paraplegia, unspecified: Secondary | ICD-10-CM | POA: Diagnosis not present

## 2024-07-21 LAB — GLUCOSE, CAPILLARY
Glucose-Capillary: 87 mg/dL (ref 70–99)
Glucose-Capillary: 117 mg/dL — ABNORMAL HIGH (ref 70–99)
Glucose-Capillary: 120 mg/dL — ABNORMAL HIGH (ref 70–99)
Glucose-Capillary: 120 mg/dL — ABNORMAL HIGH (ref 70–99)
Glucose-Capillary: 100 mg/dL — ABNORMAL HIGH (ref 70–99)

## 2024-07-21 LAB — COMPREHENSIVE METABOLIC PANEL WITH GFR
ALT: 21 U/L (ref 0–44)
AST: 28 U/L (ref 15–41)
Albumin: 1.9 g/dL — ABNORMAL LOW (ref 3.5–5.0)
Alkaline Phosphatase: 104 U/L (ref 38–126)
Anion gap: 4 — ABNORMAL LOW (ref 5–15)
BUN: 21 mg/dL (ref 8–23)
CO2: 28 mmol/L (ref 22–32)
Calcium: 7.4 mg/dL — ABNORMAL LOW (ref 8.9–10.3)
Chloride: 112 mmol/L — ABNORMAL HIGH (ref 98–111)
Creatinine, Ser: 0.65 mg/dL (ref 0.61–1.24)
GFR, Estimated: >60 mL/min
Glucose, Bld: 129 mg/dL — ABNORMAL HIGH (ref 70–99)
Potassium: 4.3 mmol/L (ref 3.5–5.1)
Sodium: 145 mmol/L (ref 135–145)
Total Bilirubin: 0.4 mg/dL (ref 0.0–1.2)
Total Protein: 4.7 g/dL — ABNORMAL LOW (ref 6.5–8.1)

## 2024-07-21 LAB — CBC
HCT: 40.1 % (ref 39.0–52.0)
Hemoglobin: 12.8 g/dL — ABNORMAL LOW (ref 13.0–17.0)
MCH: 31.7 pg (ref 26.0–34.0)
MCHC: 31.9 g/dL (ref 30.0–36.0)
MCV: 99.3 fL (ref 80.0–100.0)
Platelets: 330 10*3/uL (ref 150–400)
RBC: 4.04 MIL/uL — ABNORMAL LOW (ref 4.22–5.81)
RDW: 17.8 % — ABNORMAL HIGH (ref 11.5–15.5)
WBC: 21.5 10*3/uL — ABNORMAL HIGH (ref 4.0–10.5)
nRBC: 0 % (ref 0.0–0.2)

## 2024-07-21 LAB — PHOSPHORUS: Phosphorus: 3.2 mg/dL (ref 2.5–4.6)

## 2024-07-21 LAB — MAGNESIUM: Magnesium: 2.5 mg/dL — ABNORMAL HIGH (ref 1.7–2.4)

## 2024-07-21 MED ORDER — ORAL CARE MOUTH RINSE: 15.0000 mL | OROMUCOSAL | Status: DC | PRN

## 2024-07-21 MED ORDER — ORAL CARE MOUTH RINSE
15.0000 mL | OROMUCOSAL | Status: DC
  Administered 2024-07-21 – 2024-07-24 (×12): 15 mL via OROMUCOSAL

## 2024-07-21 NOTE — Plan of Care (Signed)
" °  Problem: Fluid Volume: Goal: Hemodynamic stability will improve Outcome: Progressing   Problem: Clinical Measurements: Goal: Diagnostic test results will improve Outcome: Progressing Goal: Signs and symptoms of infection will decrease Outcome: Progressing   Problem: Respiratory: Goal: Ability to maintain adequate ventilation will improve Outcome: Progressing   Problem: Clinical Measurements: Goal: Ability to maintain clinical measurements within normal limits will improve Outcome: Progressing Goal: Will remain free from infection Outcome: Progressing Goal: Diagnostic test results will improve Outcome: Progressing Goal: Respiratory complications will improve Outcome: Progressing Goal: Cardiovascular complication will be avoided Outcome: Progressing   Problem: Activity: Goal: Risk for activity intolerance will decrease Outcome: Progressing   Problem: Nutrition: Goal: Adequate nutrition will be maintained Outcome: Progressing   Problem: Coping: Goal: Level of anxiety will decrease Outcome: Progressing   Problem: Safety: Goal: Ability to remain free from injury will improve Outcome: Progressing   Problem: Skin Integrity: Goal: Risk for impaired skin integrity will decrease Outcome: Progressing   Problem: Elimination: Goal: Will not experience complications related to bowel motility Outcome: Progressing Goal: Will not experience complications related to urinary retention Outcome: Progressing   Problem: Pain Managment: Goal: General experience of comfort will improve and/or be controlled Outcome: Progressing   "

## 2024-07-21 NOTE — Progress Notes (Signed)
 "  NAME:  Randall Mccormick, MRN:  [id number], DOB:  08-30-1955, LOS: 12 ADMISSION DATE:  07/09/2024, CONSULTATION DATE:  07/14/24 REFERRING MD: None , CHIEF COMPLAINT:  Respiratory Decline   History of Present Illness:  Patient is a Randall Mccormick is a 69 y.o. 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  In the ED, patient was tachycardic with fever.  Labs showed creatinine of 1.0.  Lactate of 2.3.  Total bilirubin of 2.6.  COVID influenza and RSV was negative.  Chest x-ray showed cardiomegaly.  CT scan of the abdomen pelvis showed fluid throughout the colon consistent with diarrhea/enteritis. He was tested positive for C. Diff and was being treated with PO vancomycin .  On 1/20 PCCM was consulted to evaluate the pt for not being responsive and having increased WOB. Pt was transitioned from [place] to Bipap. PCCM recommended pt being monitored on the floor as he was tolerating bipap well and was maintaining his airway, with low threshold to move to ICU if need be.   On 1/21 pt had worsening respiratory status and and abnormal ABG requiring intubation. Repeat ABG showed an improvement in Pco2, and O2 levels.  Pertinent  Medical History  Per above   Significant Hospital Events: Including procedures, antibiotic start and stop dates in addition to other pertinent events   1/16: admitted  Blood culture negative flu and COVID PCR negative Stool C. difficile antigen and toxin positive 1/20: Became less responsive with increased WOB 1/21: Intubated and admitted to ICU for decline in respiratory status  Respiratory culture with moderate Candida albicans MRSA PCR negative 1/23 - Saw pt at bedside. Opens eyes. Responding to noxious stimuli. Doesn't follow commands  Copious diarrhea:  Changed from oral  vancomycin  to higher dose oral vancomycin  associated with IV Flagyl  Lactate and abdominal x-ray within normal limits. Core track left naris introduced yesterday  1/24: Remains on ventilator 50% FiO2.  On tube feeds through core track.  On fentanyl  infusion.  On low-dose Levophed  infusion.  Renal function holding.  Phosphorus very low.  Fever still present.?  Plateauing.  White count 30,900 and slightly higher but?  Plateauing. Turned all sedation gtt off. STil with diarrhe aper RN - but unclear if better 250cc overnight per RN 1/27: extubated and HHFNC; did well post extubation   Interim History / Subjective:   Opens eyes; follows commands; communicating.   Objective    Blood pressure (!) 105/52, pulse 76, temperature 98.1 F (36.7 C), temperature source Oral, resp. rate (!) 23, height 5' 8 (1.727 m), weight 101.5 kg, SpO2 97%.    Vent Mode: CPAP;PSV FiO2 (%):  [40 %] 40 % Set Rate:  [26 bmp] 26 bmp Vt Set:  [550 mL] 550 mL PEEP:  [6 cmH20-8 cmH20] 8 cmH20 Pressure Support:  [5 cmH20] 5 cmH20 Plateau Pressure:  [19 cmH20] 19 cmH20   Intake/Output Summary (Last 24 hours) at 07/21/2024 9178 Last data filed at 07/21/2024 0700 Gross per 24 hour  Intake 3412 ml  Output 1500 ml  Net 1912 ml   Filed Weights   07/19/24 0414 07/20/24 0434 07/21/24 0336  Weight: 100.4 kg 100 kg 101.5 kg    Examination:  Chronically ill; gives a thumbs up when asked if he was feeling better  Moving both left and R extremities; able to squeeze my fingers  Abd soft but distended ; denies tenderness in abd  Resolved problem list   Assessment and Plan   Depression on Prozac , Xanax  prn and Zygprexa scheduleds Chronic pain - on oxy Chronic gout -0n allopurinoil Hx of in-hospital prolonged encephalopathy Encephalopathy 2/2 metabolic, infectious causes Incomplete paraplegia with neurogenic bladder  -uses walker at home. Baseline medical issue Respiratory decline 1/20,   intubated 07/14/24 Acute resp  failure mixed groups 1/2 Severe sepsis due to C. difficile enteritis, POA Hyperlipidemia: Incomplete paraplegia with neurogenic bladder on vesicare , flomax  as thome AKI likely 2/2 to sepsis per below  Hypernatremia 07/18/24 Gout  -Extubated yesterday. Doing well and in no resp distress. Mentation has improved and he is now communicating well. Can be transferred to wards today -Na levels normalized.   Continue PTA meds as ordered except any CNS acting agents Continue PO vanc and IV flagyl  Transfer to wards today Needs extended rehab due to weakness   Patient Lines/Drains/Airways Status     Active Line/Drains/Airways     Name Placement date Placement time Site Days   Peripheral IV 07/09/24 18 G Right Antecubital 07/09/24  1816  Antecubital  12   Peripheral IV 07/09/24 20 G Anterior;Left;Upper Arm 07/09/24  1858  Arm  12   CVC Triple Lumen 07/14/24 Left Internal jugular 07/14/24  0720  -- 7   External Urinary Catheter 07/11/24  0000  --  10   Fecal Management System 45 mL 07/10/24  1445  -- 11   Airway 7.5 mm 07/14/24  0615  -- 7   Small Bore Feeding Tube 10 Fr. Left nare Marking at nare/corner of mouth 74 cm 07/16/24  1559  Left nare  5   Wound 07/14/24 0535 Pressure Injury Vertebral column Lower Stage 1 -  Intact skin with non-blanchable redness of a localized area usually over a bony prominence. 07/14/24  0535  Vertebral column  7   Wound 07/15/24 1900 Other (Comment) Nose 07/15/24  1900  Nose  6   Wound 07/20/24 0800 Other (Comment) Buttocks Medial 07/20/24  0800  Buttocks  1              "

## 2024-07-21 NOTE — Procedures (Signed)
 Objective Swallowing Evaluation: Type of Study: FEES   Patient Details  Name: Randall Mccormick MRN: [id number] Date of Birth: [birth year]  Today's Date: 07/21/2024 Time: SLP Start Time (ACUTE ONLY): 1200 -SLP Stop Time (ACUTE ONLY): 1228  SLP Time Calculation (min) (ACUTE ONLY): 28 min   Past Medical History:  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 Hx of daily Alcohol consumption 05/15/2024   Chronic pain 12/24/2023   Chronic pain syndrome    Cobalamin deficiency    Constipation    Cord compression (HCC) 07/20/2020   Degeneration of lumbar or lumbosacral intervertebral disc    Degenerative lumbar spinal stenosis 11/23/2019   Depression    Difficulty walking 12/11/2022   Disorder of spinal region [facility]) 12/11/2022   Displacement of lumbar intervertebral disc without myelopathy 03/02/2014   Dupuytren's contracture of right hand 03/17/2019   Esophageal dysphagia 05/17/2024   Foot-drop 12/11/2022   Gouty arthropathy    IMO SNOMED Dx Update Oct 2024     Hematemesis with nausea    Hemorrhoids    Hyperkalemia    Hyperlipidemia    Hypertension    Hypoalbuminemia due to protein-calorie malnutrition    Incomplete paraplegia (HCC) 09/08/2020   Low back pain 05/12/2023   Lumbago with sciatica, left side 03/22/2024   Lumbago with sciatica, right side 03/22/2024   Lumbar spondylosis 11/03/2013   Mitral regurgitation 03/23/2024   Moderate alcohol consumption 03/23/2024   Multiple fractures of ribs of left side 04/20/2013    FALL FROM A LADDER ON    Muscle atrophy 12/11/2022   Neurogenic bladder    Neurogenic bowel    Neuropathic pain    OA (osteoarthritis) of knee 03/28/2022   Osteoarthritis of right hip 01/07/2014   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/24/2011   Pneumonia 12/24/2023   Pressure injury of skin 04/09/2022   Right hip pain 01/07/2014   S/P spinal fusion 07/20/2020   Sacroiliac dysfunction 11/03/2013   Sciatica 03/22/2024   Sepsis (HCC) 09/11/2023   Septic shock (HCC)    SOB (shortness of breath) 05/15/2024   Spasticity 09/08/2020   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/2020   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/2020   Uremia 04/07/2022   Urinary retention 03/28/2021   Vitamin D deficiency    Weakness 12/24/2023   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/08/2020   Past Surgical History:  Past Surgical History:  Procedure Laterality Date   APPENDECTOMY     BACK SURGERY     x 6   CHOLECYSTECTOMY  06/24/2008   [facility]   ESOPHAGOGASTRODUODENOSCOPY N/A 04/06/2022   Procedure: ESOPHAGOGASTRODUODENOSCOPY (EGD);  Surgeon: Legrand Victory LITTIE DOUGLAS, MD;  Location: [facility] ENDOSCOPY;  Service: Gastroenterology;  Laterality: N/A;   FOOT SURGERY Right 05/2024   TONSILLECTOMY     TOTAL KNEE ARTHROPLASTY Right 03/28/2022   Procedure: RIGHT TOTAL KNEE ARTHROPLASTY;  Surgeon: Addie Cordella Hamilton, MD;  Location: [facility];  Service: Orthopedics;  Laterality: Right;   HPI: Randall Mccormick is a 69 y.o. male who  presented to the hospital with fever and diarrhea. Tested positive for C. Diff and was being treated with PO vancomycin . On 1/20 PCCM was consulted to evaluate the pt for not being responsive and having increased WOB. Pt was transitioned from [place] to [facility]ip[facility].  On 1/21 pt had worsening respiratory status and and abnormal ABG requiring intubation. ETT 1/21-27. Pt known to this service from prior admissions.  MBSS 05/17/24 with recommendations for mechanical soft solids with  thin liquids.  Pt with hx esophageal dysphagia and prior dilation. Pt with past medical history significant for thoracic myelopathy/incomplete paraplegia with neurogenic bowel/bladder, hypertension, hyperlipidemia, depression/anxiety, oropharyngeal dysphagia, chronic pain.   Subjective: Pt awake, alert, pleasant, participative.  Wife present    Assessment / Plan / Recommendation     07/21/2024    1:10 PM  Clinical Impressions  Clinical Impression Pt presents with a mild oropharyngeal dysphagia c/b delay swallow initiation, reduced base of tongue retraction, incomplete laryngeal closure, reduced pharyngeal stripping wave, decreased UES opening, and trace backflow to the pyriform sinuses.  These deficits resulted in audible penetration of thin lqiuid prior to the swallow with suspected aspiration during swallow.  Reflexive cough did not fully clear penetration/aspiration. Secretions standing in pyrifom sinuses with some invasion of interarytenoid space.  Pt with spontaneous cough later which cleared green tinged secretions, suspect this sucessfuly cleared aspiration. There are bilateral excressences at posterior commisure of vocal folds. With mildly thick/nectar thick liquids there was no penetration or aspiration.  Pt with diffuse pharyngeal residue with all solids.  Pt benefited from liquid wash to reduce residue.  There was oral loss of soft solid, and nectar thick liquids.  Trace backflow of thin liquid at UES which is consistent with MBSS from November, and may also be impacted by presence of cortrak. There was no spillover of backflow or residuals into laryngeal vestibule. Will initiate modified diet with SLP to follow for tolerance and readiness for advancement.     Recommend puree diet with nectar thick liquids.    SLP Visit Diagnosis --  Attention and concentration deficit following --  Frontal lobe and executive function deficit following --  Impact on safety and function Mild aspiration  risk         07/21/2024    1:10 PM  Treatment Recommendations  Treatment Recommendations Therapy as outlined in treatment plan below        07/21/2024    1:18 PM  Prognosis  Prognosis for improved oropharyngeal function Good  Barriers to Reach Goals --  Barriers/Prognosis Comment --    Swallow Evaluation Recommendations Liquid Administration via: Cup;Straw Medication Administration: Whole meds with liquid (as tolerated, no specific precautions)          07/21/2024    1:10 PM  Other Recommendations  Recommended Consults --  Oral Care Recommendations Oral care BID;Oral care before and after PO  Caregiver Recommendations --  Follow Up Recommendations --  Assistance recommended at discharge --  Functional Status Assessment Patient has had a recent decline in their functional status and demonstrates the ability to make significant improvements in function in a reasonable and predictable amount of time.       07/21/2024    1:10 PM  Frequency and Duration   Speech Therapy Frequency (ACUTE ONLY) min 2x/week  Treatment Duration 2 weeks         07/21/2024    1:07 PM  Oral Phase  Oral Phase Impaired  Oral - Pudding Teaspoon --  Oral - Pudding Cup --  Oral - Honey Teaspoon --  Oral - Honey Cup --  Oral - Nectar Teaspoon --  Oral - Nectar Cup Left anterior bolus loss  Oral - Nectar Straw WFL  Oral - Thin Teaspoon --  Oral - Thin Cup --  Oral - Thin Straw WFL  Oral - Puree WFL  Oral - Mech Soft Left anterior bolus loss  Oral - Regular Left anterior bolus loss  Oral - Multi-Consistency --  Oral - Pill --  Oral Phase - Comment --       07/21/2024    1:08 PM  Pharyngeal Phase  Pharyngeal Phase Impaired  Pharyngeal- Pudding Teaspoon --  Pharyngeal --  Pharyngeal- Pudding Cup --  Pharyngeal --  Pharyngeal- Honey Teaspoon --  Pharyngeal --  Pharyngeal- Honey Cup --  Pharyngeal --  Pharyngeal- Nectar Teaspoon --  Pharyngeal --  Pharyngeal- Nectar Cup [facility]   Pharyngeal Material does not enter airway  Pharyngeal- Nectar Straw WFL  Pharyngeal Material does not enter airway  Pharyngeal- Thin Teaspoon --  Pharyngeal --  Pharyngeal- Thin Cup --  Pharyngeal --  Pharyngeal- Thin Straw Delayed swallow initiation-pyriform sinuses;Reduced airway/laryngeal closure;Penetration/Aspiration during swallow;Penetration/Aspiration before swallow;Trace aspiration  Pharyngeal Material enters airway, passes BELOW cords and not ejected out despite cough attempt by patient  Pharyngeal- Puree Delayed swallow initiation-vallecula;Reduced pharyngeal peristalsis;Reduced tongue base retraction;Pharyngeal residue - valleculae;Pharyngeal residue - pyriform;Pharyngeal residue - posterior pharnyx  Pharyngeal Material does not enter airway  Pharyngeal- Mechanical Soft Reduced pharyngeal peristalsis;Reduced tongue base retraction;Pharyngeal residue - valleculae;Pharyngeal residue - pyriform;Pharyngeal residue - posterior pharnyx  Pharyngeal Material does not enter airway  Pharyngeal- Regular Reduced pharyngeal peristalsis;Reduced tongue base retraction;Pharyngeal residue - valleculae;Pharyngeal residue - pyriform;Pharyngeal residue - posterior pharnyx  Pharyngeal Material does not enter airway  Pharyngeal- Multi-consistency --  Pharyngeal --  Pharyngeal- Pill --  Pharyngeal --  Pharyngeal Comment --        07/21/2024    1:10 PM  Cervical Esophageal Phase   Cervical Esophageal Phase Impaired  Pudding Teaspoon --  Pudding Cup --  Honey Teaspoon --  Honey Cup --  Nectar Teaspoon --  Nectar Cup --  Nectar Straw --  Thin Teaspoon --  Thin Cup --  Thin Straw Esophageal backflow into cervical esophagus  Puree --  Mechanical Soft --  Regular --  Multi-consistency --  Pill --  Cervical Esophageal Comment --     Anette FORBES Grippe, MA, CCC-SLP Acute Rehabilitation Services Office: [phone] 07/21/2024, 1:18 PM

## 2024-07-21 NOTE — TOC Initial Note (Signed)
 Transition of Care Randall Mccormick) - Initial/Assessment Note    Patient Details  Name: Randall Mccormick MRN: [id number] Date of Birth: [birth year]  Transition of Care Randall Mccormick:    Randall Randall Stain, RN Phone Number: 07/21/2024, 12:23 PM  Clinical Narrative:                 Spoke to patient's wife, Randall Mccormick, regarding transition needs. Patient is active with [facility] home health, notified Randall Mccormick of admission.  Need resumption orders for PT OT RN speech. Patient has all needed DME bedside commode, wheelchair, rollator, transport chair. Watch for patient's need for home oxygen. Address, Phone number and PCP verified.   Expected Discharge Plan: Home w Home Health Services Barriers to Discharge: Continued Medical Work up   Patient Goals and CMS Choice Patient states their goals for this hospitalization and ongoing recovery are:: get better CMS Medicare.gov Compare Post Acute Care list provided to:: Patient Represenative (must comment) Choice offered to / list presented to : Spouse      Expected Discharge Plan and Services   Discharge Planning Services: CM Consult Post Acute Care Choice: Home Health Living arrangements for the past 2 months: Single Family Home                           HH Arranged: RN, PT, OT, Speech Therapy HH Agency: [facility] Date [facility] [place] [facility] [place] Agency Contacted: 07/21/24 Time HH Agency Contacted: 1222 Representative spoke with at [facility] Agency: Randall Mccormick  Prior Living Arrangements/Services Living arrangements for the past 2 months: Single Family Home Lives with:: Spouse Patient language and need for interpreter reviewed:: Yes Do you feel safe going back to the place where you live?: Yes      Need for Family Participation in Patient Care: Yes (Comment) Care giver support system in place?: Yes (comment) Current home services: Home OT, Home PT, Home RN, DME (Side commode, wheelchair, rollator, transport chair) Criminal Activity/Legal Involvement Pertinent  to Current Situation/Hospitalization: No - Comment as needed  Activities of Daily Living      Permission Sought/Granted Permission sought to share information with : Photographer granted to share info w AGENCY: Home health        Emotional Assessment         Alcohol / Substance Use: Not Applicable Psych Involvement: No (comment)  Admission diagnosis:  Severe sepsis (HCC) [A41.9, R65.20] Infectious diarrhea [A09] Sepsis, due to unspecified organism, unspecified whether acute organ dysfunction present Randall Mccormick) [A41.9] Patient Active Problem List   Diagnosis Date Noted   Infectious diarrhea 07/14/2024   Hyperbilirubinemia 07/09/2024   Enteritis 07/09/2024   Degeneration of lumbar or lumbosacral intervertebral disc    Aspiration into airway 05/17/2024   Esophageal dysphagia 05/17/2024   SOB (shortness of breath) 05/15/2024   C/O Cirrhosis. Hx of daily Alcohol consumption 05/15/2024   Mitral regurgitation 03/23/2024   Moderate alcohol consumption 03/23/2024   Lumbago with sciatica, left side 03/22/2024   Lumbago with sciatica, right side 03/22/2024   Sciatica 03/22/2024   Weakness 12/24/2023   Pneumonia 12/24/2023   Chronic pain 12/24/2023   Sepsis (HCC) 09/11/2023   Difficulty walking 12/11/2022   Foot-drop 12/11/2022   Disorder of spinal region [facility]) 12/11/2022   Muscle atrophy 12/11/2022   Aspiration pneumonia (HCC) 04/12/2022   Pressure injury of skin 04/09/2022   Uremia 04/07/2022   Acute on chronic anemia 04/07/2022   Acute respiratory failure with hypoxia (  HCC) 04/07/2022   Septic shock (HCC)    AKI (acute kidney injury)    Hyperkalemia    Acute blood loss anemia    Hematemesis with nausea    Acute metabolic encephalopathy 04/05/2022   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) 03/29/2022   OA (osteoarthritis) of knee 03/28/2022   Arthritis of knee 03/28/2022   Abnormal gait due to muscle weakness 03/28/2021    Urinary retention 03/28/2021   Incomplete paraplegia (HCC) 09/08/2020   Spasticity 09/08/2020   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/08/2020   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/2020   Hypoalbuminemia due to protein-calorie malnutrition    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/2020   Constipation    Neurogenic bowel    Neurogenic bladder    Neuropathic pain    Chronic pain syndrome    S/P spinal fusion 07/20/2020   Cord compression (HCC) 07/20/2020   Degenerative lumbar spinal stenosis 11/23/2019   Dupuytren's contracture of right hand 03/17/2019   BMI 35.0-35.9,adult 03/02/2014   Displacement of lumbar intervertebral disc without myelopathy 03/02/2014   Osteoarthritis of right hip 01/07/2014   Right hip pain 01/07/2014   Lumbar spondylosis 11/03/2013   Sacroiliac dysfunction 11/03/2013   Anxiety    Cobalamin deficiency    Hemorrhoids    Hyperlipidemia    Hypertension    Depression with anxiety    Gouty arthropathy    Vitamin D deficiency    Multiple fractures of ribs of left side 04/20/2013   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 [place] [address] Phone: [phone] Fax: [phone]  [facility] [place], [place] - [address] [place] [address] Phone: [phone] Fax: [phone]  [facility] [address] [place] [address] Phone: [phone] Fax: [phone]  [id number] [place], [place] - [address] AT [facility] [address] [place] [address] Phone: [phone] Fax: [phone]     Social Drivers of Health (SDOH) Social History: SDOH Screenings   Food Insecurity: No Food Insecurity (07/11/2024)  Housing: Low Risk (07/13/2024)   Transportation Needs: No Transportation Needs (07/13/2024)  Utilities: Not At Risk (07/13/2024)  Alcohol Screen: Low Risk (09/12/2022)  Depression (PHQ2-9): Low Risk (09/12/2022)  Financial Resource Strain: Low Risk (09/13/2022)  Physical Activity: Inactive (09/13/2022)  Social Connections: Moderately Integrated (07/13/2024)  Stress: Stress Concern Present (09/13/2022)  Tobacco Use: Medium Risk (06/29/2024)   SDOH Interventions:     Readmission Risk Interventions     No data to display

## 2024-07-21 NOTE — Evaluation (Signed)
 Clinical/Bedside Swallow Evaluation Patient Details  Name: Randall Mccormick MRN: [id number] Date of Birth: 06-Oct-1955  Today's Date: 07/21/2024 Time: SLP Start Time (ACUTE ONLY): 1130 SLP Stop Time (ACUTE ONLY): 1142 SLP Time Calculation (min) (ACUTE ONLY): 12 min  Past Medical History:  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 Hx of daily Alcohol consumption 05/15/2024   Chronic pain 12/24/2023   Chronic pain syndrome    Cobalamin deficiency    Constipation    Cord compression (HCC) 07/20/2020   Degeneration of lumbar or lumbosacral intervertebral disc    Degenerative lumbar spinal stenosis 11/23/2019   Depression    Difficulty walking 12/11/2022   Disorder of spinal region [facility]) 12/11/2022   Displacement of lumbar intervertebral disc without myelopathy 03/02/2014   Dupuytren's contracture of right hand 03/17/2019   Esophageal dysphagia 05/17/2024   Foot-drop 12/11/2022   Gouty arthropathy    IMO SNOMED Dx Update Oct 2024     Hematemesis with nausea    Hemorrhoids    Hyperkalemia    Hyperlipidemia    Hypertension    Hypoalbuminemia due to protein-calorie malnutrition    Incomplete paraplegia (HCC) 09/08/2020   Low back pain 05/12/2023   Lumbago with sciatica, left side 03/22/2024   Lumbago with sciatica, right side 03/22/2024   Lumbar spondylosis 11/03/2013   Mitral regurgitation 03/23/2024   Moderate alcohol consumption 03/23/2024   Multiple fractures of ribs of left side 04/20/2013   FALL FROM A LADDER ON     Muscle atrophy 12/11/2022   Neurogenic bladder    Neurogenic bowel    Neuropathic pain    OA (osteoarthritis) of knee 03/28/2022   Osteoarthritis of right hip 01/07/2014   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/24/2011   Pneumonia 12/24/2023   Pressure injury of skin 04/09/2022   Right hip pain 01/07/2014   S/P spinal fusion 07/20/2020   Sacroiliac dysfunction 11/03/2013   Sciatica 03/22/2024   Sepsis (HCC) 09/11/2023   Septic shock (HCC)    SOB (shortness of breath) 05/15/2024   Spasticity 09/08/2020   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/2020   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/2020   Uremia 04/07/2022   Urinary retention 03/28/2021   Vitamin D deficiency    Weakness 12/24/2023   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/08/2020   Past Surgical History:  Past Surgical History:  Procedure Laterality Date   APPENDECTOMY     BACK SURGERY     x 6   CHOLECYSTECTOMY  06/24/2008   [facility]   ESOPHAGOGASTRODUODENOSCOPY N/A 04/06/2022   Procedure: ESOPHAGOGASTRODUODENOSCOPY (EGD);  Surgeon: Legrand Victory LITTIE DOUGLAS, MD;  Location[facility] ENDOSCOPY;  Service: Gastroenterology;  Laterality: N/A;   FOOT SURGERY Right 05/2024   TONSILLECTOMY     TOTAL KNEE ARTHROPLASTY Right 03/28/2022   Procedure: RIGHT TOTAL KNEE ARTHROPLASTY;  Surgeon: Addie Cordella Hamilton, MD;  Location: [facility] OR;  Service: Orthopedics;  Laterality: Right;   HPI:  Randall Mccormick is a 69 y.o. male who presented to the hospital with fever and  diarrhea. Tested positive for C. Diff and was being treated with PO vancomycin . On 1/20 PCCM was consulted to evaluate the pt for not being responsive and having increased WOB. Pt was transitioned from [place] to [facility]ip[facility].  On 1/21 pt had worsening respiratory status and and abnormal ABG requiring intubation. ETT 1/21-27. Pt known to this service from prior admissions.  MBSS 05/17/24 with recommendations for mechanical soft solids with thin liquids.  Pt with hx  esophageal dysphagia and prior dilation. Pt with past medical history significant for thoracic myelopathy/incomplete paraplegia with neurogenic bowel/bladder, hypertension, hyperlipidemia, depression/anxiety, oropharyngeal dysphagia, chronic pain.    Assessment / Plan / Recommendation  Clinical Impression  Pt presents with clinical indicators of pharyngeal dysphagia. Pt tolerated ice chip and water  by spoon. Pt exhibted immediate wet cough following straw sip of thin liquids. Pt tolerated puree and exhibited good oral clearance.  Vocal quality normal, but with decreased vocal intensity.  Pt would benefit from further assessment of pharyngeal swallow function prior to initiation of PO diet.  Pt and wife agreeable to FEES.  Will proceed with instrumental assessment.  Recommend pt remain NPO pending results of FEES.  SLP Visit Diagnosis: Dysphagia, unspecified (R13.10)    Aspiration Risk  Moderate aspiration risk    Diet Recommendation NPO    Medication Administration: Via alternative means    Other Recommendations Oral Care Recommendations: Oral care QID     Swallow Evaluation Recommendations  TBD   Functional Status Assessment  (TBD)  Frequency and Duration  (TBD)          Prognosis Prognosis for improved oropharyngeal function:  (TBD)      Swallow Study   General Date of Onset: 07/09/24 HPI: Randall Mccormick is a 69 y.o. male who presented to the hospital with fever and diarrhea. Tested positive for C. Diff and was being treated with PO vancomycin . On 1/20 PCCM was consulted to evaluate the pt for not being responsive and having increased WOB. Pt was transitioned from [place] to Bipap.  On 1/21 pt had worsening respiratory status and and abnormal ABG requiring intubation. ETT 1/21-27. Pt known to this service from prior admissions.  MBSS 05/17/24 with recommendations for mechanical soft solids with thin liquids.  Pt with hx esophageal dysphagia and prior dilation. Pt with past medical history  significant for thoracic myelopathy/incomplete paraplegia with neurogenic bowel/bladder, hypertension, hyperlipidemia, depression/anxiety, oropharyngeal dysphagia, chronic pain. Type of Study: Bedside Swallow Evaluation Previous Swallow Assessment: MBS 05/17/24 Diet Prior to this Study: NPO Respiratory Status: Nasal cannula History of Recent Intubation: Yes Total duration of intubation (days): 6 days Date extubated: 07/20/24 Behavior/Cognition: Alert;Cooperative;Pleasant mood Oral Cavity Assessment: Within Functional Limits Oral Care Completed by SLP: Yes Oral Cavity - Dentition: Adequate natural dentition Self-Feeding Abilities: Needs assist Patient Positioning: Upright in bed Baseline Vocal Quality: Low vocal intensity Volitional Cough: Weak Volitional Swallow: Able to elicit    Oral/Motor/Sensory Function Overall Oral Motor/Sensory Function: Mild impairment Facial ROM: Reduced left Facial Symmetry: Within Functional Limits Lingual ROM: Within Functional Limits Lingual Symmetry: Within Functional Limits Lingual Strength: Reduced Velum: Within Functional Limits Mandible: Within Functional Limits   Ice Chips Ice chips: Within functional limits   Thin Liquid Thin Liquid: Impaired Presentation: Spoon;Straw Oral Phase Functional Implications: Left anterior spillage Pharyngeal  Phase Impairments: Cough - Immediate;Suspected delayed Swallow;Wet Vocal Quality    Nectar Thick Nectar Thick Liquid: Not tested   Honey Thick Honey Thick Liquid: Not tested   Puree Puree: Within functional limits Presentation: Spoon   Solid  Solid: Not tested      Anette FORBES Grippe, MA, CCC-SLP Acute Rehabilitation Services Office: [phone] 07/21/2024,1:04 PM

## 2024-07-22 DIAGNOSIS — J9601 Acute respiratory failure with hypoxia: Secondary | ICD-10-CM | POA: Diagnosis not present

## 2024-07-22 DIAGNOSIS — K529 Noninfective gastroenteritis and colitis, unspecified: Secondary | ICD-10-CM | POA: Diagnosis not present

## 2024-07-22 DIAGNOSIS — G9341 Metabolic encephalopathy: Secondary | ICD-10-CM | POA: Diagnosis not present

## 2024-07-22 DIAGNOSIS — G894 Chronic pain syndrome: Secondary | ICD-10-CM

## 2024-07-22 LAB — COMPREHENSIVE METABOLIC PANEL WITH GFR
ALT: 20 U/L (ref 0–44)
AST: 29 U/L (ref 15–41)
Albumin: 2.2 g/dL — ABNORMAL LOW (ref 3.5–5.0)
Alkaline Phosphatase: 108 U/L (ref 38–126)
Anion gap: 6 (ref 5–15)
BUN: 16 mg/dL (ref 8–23)
CO2: 26 mmol/L (ref 22–32)
Calcium: 7.6 mg/dL — ABNORMAL LOW (ref 8.9–10.3)
Chloride: 107 mmol/L (ref 98–111)
Creatinine, Ser: 0.57 mg/dL — ABNORMAL LOW (ref 0.61–1.24)
GFR, Estimated: >60 mL/min
Glucose, Bld: 127 mg/dL — ABNORMAL HIGH (ref 70–99)
Potassium: 4.4 mmol/L (ref 3.5–5.1)
Sodium: 139 mmol/L (ref 135–145)
Total Bilirubin: 0.4 mg/dL (ref 0.0–1.2)
Total Protein: 5.2 g/dL — ABNORMAL LOW (ref 6.5–8.1)

## 2024-07-22 LAB — CBC
HCT: 40.5 % (ref 39.0–52.0)
Hemoglobin: 13.1 g/dL (ref 13.0–17.0)
MCH: 32.2 pg (ref 26.0–34.0)
MCHC: 32.3 g/dL (ref 30.0–36.0)
MCV: 99.5 fL (ref 80.0–100.0)
Platelets: 397 10*3/uL (ref 150–400)
RBC: 4.07 MIL/uL — ABNORMAL LOW (ref 4.22–5.81)
RDW: 17.4 % — ABNORMAL HIGH (ref 11.5–15.5)
WBC: 20.7 10*3/uL — ABNORMAL HIGH (ref 4.0–10.5)
nRBC: 0 % (ref 0.0–0.2)

## 2024-07-22 LAB — GLUCOSE, CAPILLARY
Glucose-Capillary: 122 mg/dL — ABNORMAL HIGH (ref 70–99)
Glucose-Capillary: 135 mg/dL — ABNORMAL HIGH (ref 70–99)
Glucose-Capillary: 97 mg/dL (ref 70–99)
Glucose-Capillary: 133 mg/dL — ABNORMAL HIGH (ref 70–99)
Glucose-Capillary: 130 mg/dL — ABNORMAL HIGH (ref 70–99)
Glucose-Capillary: 141 mg/dL — ABNORMAL HIGH (ref 70–99)

## 2024-07-22 LAB — PHOSPHORUS: Phosphorus: 2.5 mg/dL (ref 2.5–4.6)

## 2024-07-22 LAB — MAGNESIUM: Magnesium: 2.5 mg/dL — ABNORMAL HIGH (ref 1.7–2.4)

## 2024-07-22 MED ORDER — FREE WATER
200.0000 mL | Status: DC
  Administered 2024-07-22 – 2024-07-23 (×9): 200 mL

## 2024-07-22 MED ORDER — OSMOLITE 1.5 CAL PO LIQD
1400.0000 mL | ORAL | Status: DC
  Administered 2024-07-22 – 2024-07-23 (×2): 1400 mL
  Filled 2024-07-22 (×2): qty 1422
  Filled 2024-07-22: qty 2000

## 2024-07-22 MED ORDER — ENSURE PLUS HIGH PROTEIN PO LIQD
237.0000 mL | Freq: Two times a day (BID) | ORAL | Status: DC
  Administered 2024-07-22 – 2024-07-24 (×3): 237 mL via ORAL

## 2024-07-22 MED ORDER — TAMSULOSIN HCL 0.4 MG PO CAPS
0.4000 mg | ORAL_CAPSULE | Freq: Every day | ORAL | Status: DC
  Administered 2024-07-22 – 2024-07-23 (×2): 0.4 mg via ORAL
  Filled 2024-07-22 (×2): qty 1

## 2024-07-22 MED ORDER — FLUTICASONE PROPIONATE 50 MCG/ACT NA SUSP
1.0000 | Freq: Every day | NASAL | Status: DC
  Administered 2024-07-22 – 2024-07-24 (×3): 1 via NASAL
  Filled 2024-07-22: qty 16

## 2024-07-22 MED ORDER — BANATROL TF EN LIQD
60.0000 mL | Freq: Three times a day (TID) | ENTERAL | Status: DC
  Administered 2024-07-22 – 2024-07-24 (×6): 60 mL
  Filled 2024-07-22 (×6): qty 60

## 2024-07-22 NOTE — Progress Notes (Addendum)
 "                        PROGRESS NOTE        PATIENT DETAILS Name: Randall Mccormick Age: 69 y.o. Sex: male Date of Birth: Mar 02, 1956 Admit Date: 07/09/2024 Admitting Physician Jorie JONELLE Blanch, MD ERE:[name], [name], MD  Brief Summary: Patient is a 69 y.o.  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 difficile colitis, hospital course complicated by progressive metabolic/toxic encephalopathy-with worsening hypoxic respiratory failure-requiring transfer to the ICU and mechanical intubation.  He was stabilized and then transferred back to [facility] service on 1/29.  Significant events: 1/16>> mid to [facility] 1/20>> worsening encephalopathy-respiratory distress-started on BiPAP 1/21>> transferred to ICU-intubated 1/27>> extubated 1/29>> transferred to [facility]  Significant studies: 1/16>> CT abdomen/pelvis: Fluid throughout colon consistent with diarrheal state/enteritis. 1/20>> CXR: Patchy right lower lobe opacity 1/20>> CT head: No acute intracranial abnormality 1/21>> MRI brain: No acute intracranial abnormality 1/26>> MRI brain: No acute intracranial abnormality 1/26>> MRI cervical spine: No acute abnormality-no fracture-multilevel cervical spondylosis. 1/26>> EEG: No seizures  Significant microbiology data: 1/16>> COVID/influenza/RSV PCR: Negative 1/16>> stool C. difficile studies (antigen/toxin): Positive 1/16>> stool GI pathogen panel: Negative 1/16>> blood culture: No growth 1/21>> tracheal aspirate: Moderate Candida albicans.  Procedures: See above  Consults: PCCM  Subjective: Looks incredibly frail/weak-mostly whispers but able to phonate slowly.  Denies any abdominal pain.  No chest pain or shortness of breath.  Objective: Vitals: Blood pressure 123/68, pulse 92, temperature 98.8 F (37.1 C),  temperature source Axillary, resp. rate (!) 23, height 5' 8 (1.727 m), weight 104.7 kg, SpO2 98%.   Exam: Gen Exam: Incredibly frail-but not in any distress HEENT:atraumatic, normocephalic Chest: B/L clear to auscultation anteriorly-some transmitted upper airway sounds intermittently CVS:S1S2 regular Abdomen:soft non tender, non distended Extremities:++edema Neurology: Has diffuse weakness-mostly lower extremities (per history-paraplegic at baseline) Skin: no rash  Pertinent Labs/Radiology:    Latest Ref Rng & Units 07/22/2024    3:40 AM 07/21/2024    5:00 AM 07/20/2024    5:53 AM  CBC  WBC 4.0 - 10.5 K/uL 20.7  21.5  28.0   Hemoglobin 13.0 - 17.0 g/dL 86.8  87.1  86.1   Hematocrit 39.0 - 52.0 % 40.5  40.1  41.7   Platelets 150 - 400 K/uL 397  330  356     Lab Results  Component Value Date   NA 139 07/22/2024   K 4.4 07/22/2024   CL 107 07/22/2024   CO2 26 07/22/2024      Assessment/Plan: Severe sepsis secondary to C. difficile colitis Slowly improving Continues to have some diarrhea Leukocytosis slowly downtrending On oral Vanco/IV Flagyl  combination.  Acute toxic metabolic encephalopathy Felt to be secondary to sepsis-and possibly a toxic component from sedating medications Slowly improving-remains lethargic Thankfully MRI brain/Spot EEG stable Continue to minimize sedating medications as much as possible Continue to treat underlying sepsis physiology  Acute hypoxic respiratory failure s/p ETT-121/127 Felt to be secondary to a combination of encephalopathy/sepsis physiology/atelectasis Currently requiring around 4-5 L Minimize sedating medications if at all possible Incentive spirometry/flutter valve Mobilize with nursing staff/PT/OT.  AKI Likely hemodynamically mediated-improved Follow electrolytes periodically  Oropharyngeal dysphagia Secondary to critical illness related debility/deconditioning [place]  tube in place-getting NG feeds-seen by SLP-started  on pure diet on 1/28 Await further recommendations from SLP Continue to treat  underlying acute issues.  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 Currently with significant amount of debility/deconditioning related to acute illness Continue to work with PT/OT  Chronic pain On gabapentin  and as needed oxycodone  chronically-given encephalopathy-all narcotics held-given that he is slowly improving-still with significant amount of frailty-continue to hold sedating medications/narcotics.  History of depression/anxiety Continue Prozac  Olanzapine /Xanax  on hold due to encephalopathy/frailty-aim to minimize sedating medications-need to be resumed when he is a bit more awake-alert and less frail.  GERD Pepcid   HLD Statin/Zetia   HTN BP stable Amlodipine   History of gout Stable-no flare-allopurinol .  Nutrition Status: Nutrition Problem: Inadequate oral intake Etiology: inability to eat Signs/Symptoms: NPO status Interventions: MVI, Tube feeding, Prostat  Pressure Ulcer: Wound 07/14/24 0535 Pressure Injury Vertebral column Lower Stage 1 -  Intact skin with non-blanchable redness of a localized area usually over a bony prominence. (Active)   Class 2 Obesity: Estimated body mass index is 35.1 kg/m as calculated from the following:   Height as of this encounter: 5' 8 (1.727 m).   Weight as of this encounter: 104.7 kg.   Code status:   Code Status: Full Code   DVT Prophylaxis:SQ Lovenox     Family Communication: Spouse-Lynn Thone-[phone] updated 1/29   Disposition Plan: Status is: Inpatient Remains inpatient appropriate because: Severity of illness   Planned Discharge Destination:Rehabilitation facility   Diet: Diet  Order             DIET - DYS 1 Fluid consistency: Nectar Thick  Diet effective now                     Antimicrobial agents: Anti-infectives (From admission, onward)    Start     Dose/Rate Route Frequency Ordered Stop   07/20/24 1800  vancomycin  (VANCOCIN ) 50 mg/mL oral solution SOLN 500 mg        500 mg Oral Every 6 hours 07/20/24 1457 07/30/24 2359   07/16/24 1800  vancomycin  (VANCOCIN ) 50 mg/mL oral solution SOLN 500 mg  Status:  Discontinued        500 mg Per Tube Every 6 hours 07/16/24 1456 07/20/24 1457   07/16/24 1600  metroNIDAZOLE  (FLAGYL ) IVPB 500 mg        500 mg 100 mL/hr over 60 Minutes Intravenous Every 8 hours 07/16/24 1456 07/30/24 2359   07/15/24 1545  piperacillin -tazobactam (ZOSYN ) IVPB 3.375 g  Status:  Discontinued        3.375 g 12.5 mL/hr over 240 Minutes Intravenous Every 8 hours 07/15/24 1458 07/16/24 1456   07/14/24 1800  vancomycin  (VANCOCIN ) 50 mg/mL oral solution SOLN 125 mg  Status:  Discontinued        125 mg Per Tube Every 6 hours 07/14/24 1611 07/16/24 1456   07/14/24 1400  piperacillin -tazobactam (ZOSYN ) IVPB 3.375 g  Status:  Discontinued        3.375 g 12.5 mL/hr over 240 Minutes Intravenous Every 8 hours 07/14/24 0700 07/14/24 1539   07/14/24 1230  vancomycin  (VANCOCIN ) 50 mg/mL oral solution SOLN 125 mg  Status:  Discontinued        125 mg Oral Every 6 hours 07/14/24 1144 07/14/24 1611   07/14/24 0800  vancomycin  (VANCOREADY) IVPB 2000 mg/400 mL        2,000 mg 200 mL/hr over 120 Minutes Intravenous  Once  07/14/24 0700 07/14/24 1049   07/14/24 0800  piperacillin -tazobactam (ZOSYN ) IVPB 3.375 g        3.375 g 100 mL/hr over 30 Minutes Intravenous  Once 07/14/24 0703 07/15/24 1518   07/10/24 1800  vancomycin  (VANCOCIN ) capsule 125 mg  Status:  Discontinued        125 mg Oral 4 times daily 07/10/24 1704 07/14/24 1144   07/10/24 0200  piperacillin -tazobactam (ZOSYN ) IVPB 3.375 g  Status:  Discontinued        3.375 g 12.5 mL/hr over 240  Minutes Intravenous Every 8 hours 07/09/24 2158 07/10/24 1703   07/09/24 1845  vancomycin  (VANCOREADY) IVPB 2000 mg/400 mL        2,000 mg 200 mL/hr over 120 Minutes Intravenous  Once 07/09/24 1831 07/09/24 2146   07/09/24 1830  ceFEPIme  (MAXIPIME ) 2 g in sodium chloride  0.9 % 100 mL IVPB        2 g 200 mL/hr over 30 Minutes Intravenous  Once 07/09/24 1826 07/09/24 1915   07/09/24 1830  metroNIDAZOLE  (FLAGYL ) IVPB 500 mg        500 mg 100 mL/hr over 60 Minutes Intravenous  Once 07/09/24 1826 07/09/24 2045   07/09/24 1830  vancomycin  (VANCOCIN ) IVPB 1000 mg/200 mL premix  Status:  Discontinued        1,000 mg 200 mL/hr over 60 Minutes Intravenous  Once 07/09/24 1826 07/09/24 1831        MEDICATIONS: Scheduled Meds:  allopurinol   300 mg Per Tube Daily   Chlorhexidine  Gluconate Cloth  6 each Topical Daily   enoxaparin  (LOVENOX ) injection  50 mg Subcutaneous Q24H   ezetimibe   10 mg Per Tube QHS   famotidine   20 mg Per Tube BID   feeding supplement (PROSource TF20)  60 mL Per Tube Daily   fiber supplement (BANATROL TF)  60 mL Per Tube BID   FLUoxetine   40 mg Per Tube Daily   free water   400 mL Per Tube Q4H   insulin  aspart  0-9 Units Subcutaneous Q4H   mouth rinse  15 mL Mouth Rinse 4 times per day   rosuvastatin   5 mg Per Tube Daily   sodium chloride  flush  3 mL Intravenous Q12H   thiamine   100 mg Per Tube Daily   vancomycin   500 mg Oral Q6H   Continuous Infusions:  feeding supplement (VITAL 1.5 CAL) 1,000 mL (07/21/24 2021)   metronidazole  500 mg (07/22/24 0034)   PRN Meds:.acetaminophen  **OR** acetaminophen , albuterol , ondansetron  **OR** ondansetron  (ZOFRAN ) IV, mouth rinse   I have personally reviewed following labs and imaging studies  LABORATORY DATA: CBC: Recent Labs  Lab 07/18/24 0430 07/19/24 0416 07/19/24 1440 07/20/24 0553 07/21/24 0500 07/22/24 0340  WBC 35.9* 33.8*  --  28.0* 21.5* 20.7*  HGB 14.1 14.1 15.6 13.8 12.8* 13.1  HCT 42.6 43.2 46.0 41.7 40.1  40.5  MCV 97.3 98.9  --  97.4 99.3 99.5  PLT 296 324  --  356 330 397    Basic Metabolic Panel: Recent Labs  Lab 07/18/24 0430 07/19/24 0416 07/19/24 1440 07/20/24 0553 07/21/24 0500 07/22/24 0340  NA 151* 148* 149* 148* 145 139  K 4.7 4.9 4.9 4.5 4.3 4.4  CL 118* 114*  --  115* 112* 107  CO2 27 28  --  27 28 26   GLUCOSE 121* 125*  --  122* 129* 127*  BUN 23 26*  --  26* 21 16  CREATININE 0.79 0.87  --  0.75 0.65 0.57*  CALCIUM  7.2* 7.4*  --  7.4* 7.4* 7.6*  MG 2.8* 2.7*  --  2.7* 2.5* 2.5*  PHOS 2.5 2.9  --  2.8 3.2 2.5    GFR: Estimated Creatinine Clearance: 102.2 mL/min (A) (by C-G formula based on SCr of 0.57 mg/dL (L)).  Liver Function Tests: Recent Labs  Lab 07/18/24 0430 07/19/24 0416 07/20/24 0553 07/21/24 0500 07/22/24 0340  AST 85* 55* 40 28 29  ALT 57* 41 29 21 20   ALKPHOS 246* 191* 136* 104 108  BILITOT 0.5 0.5 0.6 0.4 0.4  PROT 4.9* 5.0* 5.0* 4.7* 5.2*  ALBUMIN 1.9* 1.8* 1.9* 1.9* 2.2*   Recent Labs  Lab 07/16/24 1437  LIPASE 57*  AMYLASE 44   Recent Labs  Lab 07/20/24 0554  AMMONIA 21    Coagulation Profile: No results for input(s): INR, PROTIME in the last 168 hours.  Cardiac Enzymes: Recent Labs  Lab 07/18/24 0430  CKTOTAL 21*  CKMB <1.0    BNP (last 3 results) No results for input(s): PROBNP in the last 8760 hours.  Lipid Profile: No results for input(s): CHOL, HDL, LDLCALC, TRIG, CHOLHDL, LDLDIRECT in the last 72 hours.  Thyroid Function Tests: No results for input(s): TSH, T4TOTAL, FREET4, T3FREE, THYROIDAB in the last 72 hours.  Anemia Panel: No results for input(s): VITAMINB12, FOLATE, FERRITIN, TIBC, IRON, RETICCTPCT in the last 72 hours.  Urine analysis:    Component Value Date/Time   COLORURINE AMBER (A) 07/09/2024 1830   APPEARANCEUR CLEAR 07/09/2024 1830   LABSPEC 1.018 07/09/2024 1830   PHURINE 5.0 07/09/2024 1830   GLUCOSEU NEGATIVE 07/09/2024 1830   HGBUR SMALL  (A) 07/09/2024 1830   BILIRUBINUR NEGATIVE 07/09/2024 1830   KETONESUR NEGATIVE 07/09/2024 1830   PROTEINUR 30 (A) 07/09/2024 1830   NITRITE NEGATIVE 07/09/2024 1830   LEUKOCYTESUR NEGATIVE 07/09/2024 1830    Sepsis Labs: Lactic Acid, Venous    Component Value Date/Time   LATICACIDVEN 1.5 07/17/2024 0450    MICROBIOLOGY: Recent Results (from the past 240 hours)  MRSA Next Gen by PCR, Nasal     Status: None   Collection Time: 07/14/24  5:23 AM   Specimen: Nasal Mucosa; Nasal Swab  Result Value Ref Range Status   MRSA by PCR Next Gen NOT DETECTED NOT DETECTED Final    Comment: (NOTE) The GeneXpert MRSA Assay (FDA approved for NASAL specimens only), is one component of a comprehensive MRSA colonization surveillance program. It is not intended to diagnose MRSA infection nor to guide or monitor treatment for MRSA infections. Test performance is not FDA approved in patients less than 72 years old. Performed at [facility], [address], [place], [place] [address]   Culture, Respiratory w Gram Stain     Status: None   Collection Time: 07/14/24  6:18 AM   Specimen: Tracheal Aspirate; Respiratory  Result Value Ref Range Status   Specimen Description TRACHEAL ASPIRATE  Final   Special Requests NONE  Final   Gram Stain   Final    MODERATE WBC PRESENT, PREDOMINANTLY PMN MODERATE YEAST RARE GRAM NEGATIVE RODS RARE GRAM NEGATIVE COCCI Performed at [facility], [address] [place], [place] [address]    Culture MODERATE CANDIDA ALBICANS  Final   Report Status 07/16/2024 FINAL  Final    RADIOLOGY STUDIES/RESULTS: No results found.   LOS: 13 days   Donalda Applebaum, MD  [facility]    To contact the attending provider between 7A-7P or the covering provider during after  hours 7P-7A, please log into the web site [email or web address] and access using universal [place] password for that web site. If you do not have the password, please call the hospital  operator.  07/22/2024, 8:29 AM    "

## 2024-07-22 NOTE — Evaluation (Signed)
 Physical Therapy Evaluation Patient Details Name: Randall Mccormick MRN: [id number] DOB: 05/07/1956 Today's Date: 07/22/2024  History of Present Illness  Randall Mccormick is a 69 y.o. male who presenting to [facility] on 1/16 with with fever and diarrhea. Pt with respiratory distress and intubated in ICU until 1/28. PMHx:  urinary retention, incomplete paraplegia, spasticity, wheelchair dependence, thoracic spondylosis, neurogenic bladder, neuropathic pain, chronic pain, anxiety, gout, HTN, L rib fractures.   Clinical Impression  Pt is currently mobilizing below his baseline due to medical complications leading to intubation in ICU. Pt is paraplegic at baseline and requires assist for mobility and ADLs. Pt is currently requiring totalAx2 for bed mobility to roll and sit EOB. Pt max-totalA to sit EOB ~8 minutes and unable to follow cues to promote trunk mobility/control. B UE muscle activation noted when cued to pull trunk forward via hand held assist but little movement occurred and pt was unable to maintain the position. Pt with limited tolerance and participation in functional mobility at this time, will plan to see 1x per week until tolerance improves. Pt appropriate to return home with [facility] PT pending family comfort with providing level of care. If family unable to provide care at home, pt will require <3 hr rehab.         If plan is discharge home, recommend the following: Two people to help with walking and/or transfers;Two people to help with bathing/dressing/bathroom;Assistance with cooking/housework;Assistance with feeding;Direct supervision/assist for medications management;Direct supervision/assist for financial management;Assist for transportation;Help with stairs or ramp for entrance;Supervision due to cognitive status   Can travel by private vehicle        Equipment Recommendations None recommended by PT (Pt reports having approrpiate equipment at home)  Recommendations for Other Services        Functional Status Assessment Patient has had a recent decline in their functional status and demonstrates the ability to make significant improvements in function in a reasonable and predictable amount of time.     Precautions / Restrictions Precautions Precautions: Fall Recall of Precautions/Restrictions: Impaired Precaution/Restrictions Comments: Contack, fecal tube, skin breakdown Restrictions Weight Bearing Restrictions Per Provider Order: No      Mobility  Bed Mobility Overal bed mobility: Needs Assistance Bed Mobility: Rolling, Sidelying to Sit, Sit to Sidelying Rolling: Total assist, +2 for physical assistance Sidelying to sit: Total assist, +2 for physical assistance     Sit to sidelying: Total assist, +2 for physical assistance General bed mobility comments: Attempts to hold bed rails when in side lying to maintain position. Pain with rolling.    Transfers                        Ambulation/Gait                  Stairs            Wheelchair Mobility     Tilt Bed    Modified Rankin (Stroke Patients Only)       Balance Overall balance assessment: Needs assistance Sitting-balance support: Feet supported, Single extremity supported Sitting balance-Leahy Scale: Zero Sitting balance - Comments: Max-totalA required for balance sitting EOB. Pt attempts to pull with B UE to assist with trunk control but is unable to maintain. Does not follow verbal cues to reposition. Postural control: Posterior lean  Pertinent Vitals/Pain Pain Assessment Pain Assessment: Faces Faces Pain Scale: Hurts little more Pain Location: Grimace with rolling Pain Descriptors / Indicators: Grimacing Pain Intervention(s): Limited activity within patient's tolerance, Repositioned, Monitored during session    Home Living Family/patient expects to be discharged to:: Private residence Living Arrangements:  Spouse/significant other Available Help at Discharge: Family;Available 24 hours/day Type of Home: House Home Access: Ramped entrance       Home Layout: One level Home Equipment: Agricultural [employer]ant (2 wheels);Rollator (4 wheels);Cane - single point;Shower seat;BSC/3in1;Grab bars - toilet;Wheelchair - manual;Grab bars - tub/shower;Lift chair;Adaptive equipment;Hospital bed;Other (comment) [place])      Prior Function Prior Level of Function : Needs assist       Physical Assist : ADLs (physical)   ADLs (physical): Feeding;Grooming;Bathing;Dressing;Toileting;IADLs Mobility Comments: Pt reports using hoyer to transfer to wheelchair but also states he uses walker to pivot to wheelchair, baseline mobility unclear. ADLs Comments: Patient is at a total A level for all ADL at bedleve.  Patient describes, bedlevel ADL at home, hoyer lift to wheelchair.     Extremity/Trunk Assessment   Upper Extremity Assessment Upper Extremity Assessment: Defer to OT evaluation RUE Deficits / Details: No purposeful AROM, swollen RUE Coordination: decreased fine motor;decreased gross motor LUE Deficits / Details: Able to touch his nose, no significant shoulder flexion LUE Coordination: decreased fine motor;decreased gross motor    Lower Extremity Assessment Lower Extremity Assessment: Generalized weakness;RLE deficits/detail;LLE deficits/detail RLE Deficits / Details: Muscle twitch noted when repositioning R LE in bed, unclear if reflexive. Pt demonstrates minimal knee extension EOB ~5-10 degrees. Difficulty following cues for AROM. PROM WNL. RLE Sensation: WNL LLE Deficits / Details: No mucle activation noted despite multimodal cues. PROM WNL. LLE Sensation: WNL    Cervical / Trunk Assessment Cervical / Trunk Assessment: Kyphotic  Communication   Communication Communication: Impaired Factors Affecting Communication: Difficulty expressing self;Reduced clarity of speech    Cognition Arousal:  Alert Behavior During Therapy: Flat affect   PT - Cognitive impairments: Difficult to assess, Awareness, Safety/Judgement, Initiation, Sequencing, Attention, Problem solving Difficult to assess due to: Impaired communication, Level of arousal                     PT - Cognition Comments: Pt intermitently falling alseep during session, alert when sitting EOB. Following commands: Impaired Following commands impaired: Follows one step commands inconsistently     Cueing Cueing Techniques: Verbal cues, Gestural cues, Tactile cues, Visual cues     General Comments General comments (skin integrity, edema, etc.): VSS throughout. B LE edema. LE PF contracture.    Exercises     Assessment/Plan    PT Assessment Patient needs continued PT services  PT Problem List Decreased strength;Decreased mobility;Decreased safety awareness;Impaired tone;Decreased range of motion;Decreased coordination;Decreased knowledge of precautions;Decreased activity tolerance;Decreased cognition;Decreased skin integrity;Decreased balance;Decreased knowledge of use of DME;Pain       PT Treatment Interventions DME instruction;Therapeutic exercise;Wheelchair mobility training;Gait training;Balance training;Neuromuscular re-education;Functional mobility training;Patient/family education;Therapeutic activities    PT Goals (Current goals can be found in the Care Plan section)  Acute Rehab PT Goals Patient Stated Goal: None stated    Frequency Min 1X/week     Co-evaluation PT/OT/SLP Co-Evaluation/Treatment: Yes Reason for Co-Treatment: Complexity of the patient's impairments (multi-system involvement) PT goals addressed during session: Mobility/safety with mobility;Balance OT goals addressed during session: ADL's and self-care       AM-PAC PT 6 Clicks Mobility  Outcome Measure Help needed turning from your back to your side while in  a flat bed without using bedrails?: Total Help needed moving from  lying on your back to sitting on the side of a flat bed without using bedrails?: Total Help needed moving to and from a bed to a chair (including a wheelchair)?: Total Help needed standing up from a chair using your arms (e.g., wheelchair or bedside chair)?: Total Help needed to walk in hospital room?: Total Help needed climbing 3-5 steps with a railing? : Total 6 Click Score: 6    End of Session   Activity Tolerance: Patient limited by fatigue;Patient limited by lethargy;Patient limited by pain Patient left: in bed;with call bell/phone within reach;with bed alarm set Nurse Communication: Mobility status;Need for lift equipment PT Visit Diagnosis: Muscle weakness (generalized) (M62.81);Other abnormalities of gait and mobility (R26.89);Other symptoms and signs involving the nervous system (R29.898)    Time: 9067-8995 PT Time Calculation (min) (ACUTE ONLY): 32 min   Charges:   PT Evaluation $PT Eval High Complexity: 1 High   PT General Charges $$ ACUTE PT VISIT: 1 Visit         Sabra Morel, PT, DPT  Acute Rehabilitation Services         Office: [phone]     Sabra MARLA Morel 07/22/2024, 12:53 PM

## 2024-07-22 NOTE — Progress Notes (Addendum)
 Nutrition Follow-up  DOCUMENTATION CODES:   Not applicable  INTERVENTION:   -48 hour calorie count ordered -Transition to nocturnal feeds via cortrak:   Osmolite 1.5 @ 100 ml/hr  over 14 hours (1800-0800)  60 ml Prosource TF20 daily.    200 ml free water  flush every 3 hours  Tube feeding regimen provides 2180 kcal (100% of needs), 108 grams of protein, and 1067 ml of H2O. Total free water : 2667 ml daily    -Increase Banatrol to TID, provides provides 45kcal, 5g soluble fiber and 2g protein per serving  Ok to use with Clostridium difficile infection per manufacturer as product solidifies the stool and does not stop gut motility.  Soluble fibers in banana flakes have a gel-like property that helps thicken stool consistency and a prebiotic effect that promotes reabsorption of fluid in the colon to reduce watery diarrhea -Continue 100 mg thiamine  daily -Ensure Plus High Protein po BID, each supplement provides 350 kcal and 20 grams of protein  -Magic cup TID with meals, each supplement provides 290 kcal and 9 grams of protein  -Changes to plan of care discussed with RN and MD  NUTRITION DIAGNOSIS:   Inadequate oral intake related to inability to eat as evidenced by NPO status.  Ongoing  GOAL:   Patient will meet greater than or equal to 90% of their needs  Met with TF  MONITOR:   PO intake, Supplement acceptance, Diet advancement, TF tolerance  REASON FOR ASSESSMENT:   Ventilator, Consult Enteral/tube feeding initiation and management  ASSESSMENT:   Pt with hx of HTN, HLD, incomplete paraplegia with neurogenic bowel/bladder,   presented to ED with fever, bloody diarrhea, and AMS. Found to be positive for Clostridium difficile after multiple recent rounds of antibiotics.  1/16 - presented to ED 1/21 - intubated, enteral feeds initiated  1/23 - cortrak placed 1/27- extubated 1/28- s/p BSE- NPO, s/p FEES- dysphagia 1 diet with nectar thick liquids  Reviewed I/O's:  +2.7 L x 24 hours and +10.7 L since admission  UOP: 800 ml x 24 hours  Rectal tube output: 125 ml x 24 hours  Patient unavailable at time of visit. Attempted to speak with patient via call to hospital room phone, however, unable to reach. RD unable to obtain further nutrition-related history or complete nutrition-focused physical exam at this time.    Case discussed with RN, who reports patient consumed 35% of breakfast this morning. He is tolerating TF well. Will plan to transition to nocturnal feeds and start calorie count to determine how much patient is eating and ability to discontinue cortrak vs discuss potential long term feeding options based upon results of calorie count.   Patient currently receiving Vital 1.5 @ 60 ml/hr and Prosource TF20 BID, which provides 2240 kcals, 117 grams protein, and 110 mg free water  daily. Banatrol was added BID to help with loose stools; he remains with rectal tube with type 7 bowel movements.   Reviewed weights; weights have ranged from 100-106.1 kg over the past 7 days. Patient with mild edema per nursing documentation.   Labs reviewed: CBGS: 97-135 (inpatient orders for glycemic control are 0-9 units insulin  aspart every 4 hours).    Diet Order:   Diet Order             DIET - DYS 1 Fluid consistency: Nectar Thick  Diet effective now                   EDUCATION NEEDS:   Education needs  have been addressed  Skin:  Skin Assessment: Skin Integrity Issues: Skin Integrity Issues:: Stage I Stage I: vertebral column  Last BM:  07/21/24 (via rectal tube)  Height:   Ht Readings from Last 1 Encounters:  07/14/24 5' 8 (1.727 m)    Weight:   Wt Readings from Last 1 Encounters:  07/22/24 104.7 kg    Ideal Body Weight:  63.6 kg  BMI:  Body mass index is 35.1 kg/m.  Estimated Nutritional Needs:   Kcal:  2100-2300 kcal/d  Protein:  105-120g/d  Fluid:  2.2 L/d    Margery ORN, RD, LDN, CDCES Registered Dietitian III Certified  Diabetes Care and Education Specialist If unable to reach this RD, please use RD Inpatient group chat on secure chat between hours of 8am-4 pm daily

## 2024-07-22 NOTE — Plan of Care (Signed)
" °  Problem: Fluid Volume: Goal: Hemodynamic stability will improve Outcome: Progressing   Problem: Clinical Measurements: Goal: Diagnostic test results will improve Outcome: Progressing Goal: Signs and symptoms of infection will decrease Outcome: Progressing   Problem: Respiratory: Goal: Ability to maintain adequate ventilation will improve Outcome: Progressing   Problem: Clinical Measurements: Goal: Will remain free from infection Outcome: Progressing   "

## 2024-07-22 NOTE — Evaluation (Signed)
 Occupational Therapy Evaluation Patient Details Name: Randall Mccormick MRN: [id number] DOB: [birth year] Today's Date: 07/22/2024   History of Present Illness   Randall Mccormick is a 69 y.o. male who presenting with  with fever and diarrhea.  PMH of Anxiety, HLD, HTN, Depression, Gout, Neurogenic B&B, and Incomplete paraplegia.     Clinical Impressions Patient admitted for the diagnosis above.  Patient most likely Total A at home, bedlevel ADL, hoyer to wheelchair.  Unsure if he could feed himself recently.  Tube feed in place.  OT will check back next week to see if there is a change in his mental status.  No OT is anticipated post acute given patient's level.       If plan is discharge home, recommend the following:   Assistance with feeding;Two people to help with bathing/dressing/bathroom     Functional Status Assessment   Patient has had a recent decline in their functional status and demonstrates the ability to make significant improvements in function in a reasonable and predictable amount of time.     Equipment Recommendations   None recommended by OT     Recommendations for Other Services         Precautions/Restrictions   Precautions Recall of Precautions/Restrictions: Impaired Precaution/Restrictions Comments: Contack, fecal tube, skin breakdown Restrictions Weight Bearing Restrictions Per Provider Order: No     Mobility Bed Mobility Overal bed mobility: Needs Assistance Bed Mobility: Rolling, Sidelying to Sit, Sit to Sidelying Rolling: Total assist, +2 for physical assistance Sidelying to sit: Total assist, +2 for physical assistance     Sit to sidelying: Total assist, +2 for physical assistance      Transfers                          Balance Overall balance assessment: Needs assistance Sitting-balance support: Feet supported, Single extremity supported Sitting balance-Leahy Scale: Zero   Postural control: Posterior lean                                  ADL either performed or assessed with clinical judgement   ADL                                         General ADL Comments: Total A of up to 2 for self care     Vision   Vision Assessment?: No apparent visual deficits     Perception Perception: Not tested       Praxis Praxis: Not tested       Pertinent Vitals/Pain Pain Assessment Pain Assessment: Faces Faces Pain Scale: Hurts little more Pain Location: Grimace with rolling Pain Descriptors / Indicators: Grimacing Pain Intervention(s): Monitored during session     Extremity/Trunk Assessment Upper Extremity Assessment Upper Extremity Assessment: Generalized weakness;Right hand dominant;RUE deficits/detail;LUE deficits/detail RUE Deficits / Details: No purposeful AROM, swollen RUE Coordination: decreased fine motor;decreased gross motor LUE Deficits / Details: Able to touch his nose, no significant shoulder flexion LUE Coordination: decreased fine motor;decreased gross motor   Lower Extremity Assessment Lower Extremity Assessment: Defer to PT evaluation   Cervical / Trunk Assessment Cervical / Trunk Assessment: Kyphotic   Communication Communication Communication: Impaired Factors Affecting Communication: Difficulty expressing self;Reduced clarity of speech   Cognition Arousal: Alert Behavior During Therapy: Flat affect Cognition: No family/caregiver present to  determine baseline                               Following commands: Impaired Following commands impaired: Follows one step commands inconsistently     Cueing  General Comments   Cueing Techniques: Verbal cues;Gestural cues;Tactile cues      Exercises     Shoulder Instructions      Home Living Family/patient expects to be discharged to:: Private residence Living Arrangements: Spouse/significant other Available Help at Discharge: Family;Available 24 hours/day Type of Home:  House Home Access: Ramped entrance     Home Layout: One level     Bathroom Shower/Tub: Walk-in shower;Sponge bathes at baseline   Bathroom Toilet: Handicapped height Bathroom Accessibility: No   Home Equipment: Agricultural Consultant (2 wheels);Rollator (4 wheels);Cane - single point;Shower seat;BSC/3in1;Grab bars - toilet;Wheelchair - manual;Grab bars - tub/shower;Lift chair;Adaptive equipment          Prior Functioning/Environment Prior Level of Function : Needs assist       Physical Assist : ADLs (physical)   ADLs (physical): Feeding;Grooming;Bathing;Dressing;Toileting;IADLs   ADLs Comments: Patient is at a total A level for all ADL at bedleve.  Patient describes, bedlevel ADL at home, hoyer lift to wheelchair.    OT Problem List: Decreased strength;Decreased range of motion;Impaired balance (sitting and/or standing);Impaired UE functional use;Increased edema   OT Treatment/Interventions: Self-care/ADL training;Therapeutic exercise;Therapeutic activities      OT Goals(Current goals can be found in the care plan section)   Acute Rehab OT Goals OT Goal Formulation: Patient unable to participate in goal setting Time For Goal Achievement: 08/05/24 Potential to Achieve Goals: Fair ADL Goals Pt Will Perform Eating: with mod assist;bed level Pt Will Perform Grooming: with mod assist;bed level Additional ADL Goal #1: Roll to the R/able to grasp and hold L handrail Max A to reduce CG burden.   OT Frequency:  Min 1X/week    Co-evaluation PT/OT/SLP Co-Evaluation/Treatment: Yes Reason for Co-Treatment: Complexity of the patient's impairments (multi-system involvement)   OT goals addressed during session: ADL's and self-care      AM-PAC OT 6 Clicks Daily Activity     Outcome Measure Help from another person eating meals?: Total Help from another person taking care of personal grooming?: Total Help from another person toileting, which includes using toliet, bedpan, or  urinal?: Total Help from another person bathing (including washing, rinsing, drying)?: Total Help from another person to put on and taking off regular upper body clothing?: Total Help from another person to put on and taking off regular lower body clothing?: Total 6 Click Score: 6   End of Session Nurse Communication: Need for lift equipment  Activity Tolerance: Patient limited by fatigue Patient left: in bed;with call bell/phone within reach;with bed alarm set  OT Visit Diagnosis: Muscle weakness (generalized) (M62.81)                Time: 0940-1006 OT Time Calculation (min): 26 min Charges:  OT General Charges $OT Visit: 1 Visit OT Evaluation $OT Eval Moderate Complexity: 1 Mod  07/22/2024  RP, OTR/L  Acute Rehabilitation Services  Office:  [phone]   Randall Mccormick 07/22/2024, 10:21 AM

## 2024-07-23 ENCOUNTER — Inpatient Hospital Stay (HOSPITAL_COMMUNITY): Payer: MEDICARE

## 2024-07-23 DIAGNOSIS — G9341 Metabolic encephalopathy: Secondary | ICD-10-CM | POA: Diagnosis not present

## 2024-07-23 DIAGNOSIS — K529 Noninfective gastroenteritis and colitis, unspecified: Secondary | ICD-10-CM | POA: Diagnosis not present

## 2024-07-23 DIAGNOSIS — G894 Chronic pain syndrome: Secondary | ICD-10-CM | POA: Diagnosis not present

## 2024-07-23 DIAGNOSIS — J9601 Acute respiratory failure with hypoxia: Secondary | ICD-10-CM | POA: Diagnosis not present

## 2024-07-23 LAB — COMPREHENSIVE METABOLIC PANEL WITH GFR
ALT: 23 U/L (ref 0–44)
AST: 47 U/L — ABNORMAL HIGH (ref 15–41)
Albumin: 2 g/dL — ABNORMAL LOW (ref 3.5–5.0)
Alkaline Phosphatase: 102 U/L (ref 38–126)
Anion gap: 7 (ref 5–15)
BUN: 15 mg/dL (ref 8–23)
CO2: 27 mmol/L (ref 22–32)
Calcium: 7.6 mg/dL — ABNORMAL LOW (ref 8.9–10.3)
Chloride: 104 mmol/L (ref 98–111)
Creatinine, Ser: 0.52 mg/dL — ABNORMAL LOW (ref 0.61–1.24)
GFR, Estimated: >60 mL/min
Glucose, Bld: 119 mg/dL — ABNORMAL HIGH (ref 70–99)
Potassium: 4.5 mmol/L (ref 3.5–5.1)
Sodium: 138 mmol/L (ref 135–145)
Total Bilirubin: 0.3 mg/dL (ref 0.0–1.2)
Total Protein: 5.3 g/dL — ABNORMAL LOW (ref 6.5–8.1)

## 2024-07-23 LAB — GLUCOSE, CAPILLARY
Glucose-Capillary: 107 mg/dL — ABNORMAL HIGH (ref 70–99)
Glucose-Capillary: 120 mg/dL — ABNORMAL HIGH (ref 70–99)
Glucose-Capillary: 129 mg/dL — ABNORMAL HIGH (ref 70–99)
Glucose-Capillary: 138 mg/dL — ABNORMAL HIGH (ref 70–99)
Glucose-Capillary: 138 mg/dL — ABNORMAL HIGH (ref 70–99)
Glucose-Capillary: 150 mg/dL — ABNORMAL HIGH (ref 70–99)
Glucose-Capillary: 152 mg/dL — ABNORMAL HIGH (ref 70–99)

## 2024-07-23 LAB — CBC
HCT: 38 % — ABNORMAL LOW (ref 39.0–52.0)
Hemoglobin: 12.6 g/dL — ABNORMAL LOW (ref 13.0–17.0)
MCH: 32.4 pg (ref 26.0–34.0)
MCHC: 33.2 g/dL (ref 30.0–36.0)
MCV: 97.7 fL (ref 80.0–100.0)
Platelets: 459 10*3/uL — ABNORMAL HIGH (ref 150–400)
RBC: 3.89 MIL/uL — ABNORMAL LOW (ref 4.22–5.81)
RDW: 17.2 % — ABNORMAL HIGH (ref 11.5–15.5)
WBC: 19.2 10*3/uL — ABNORMAL HIGH (ref 4.0–10.5)
nRBC: 0 % (ref 0.0–0.2)

## 2024-07-23 LAB — MAGNESIUM: Magnesium: 2.2 mg/dL (ref 1.7–2.4)

## 2024-07-23 LAB — PHOSPHORUS: Phosphorus: 2.9 mg/dL (ref 2.5–4.6)

## 2024-07-23 MED ORDER — FUROSEMIDE 10 MG/ML IJ SOLN
20.0000 mg | Freq: Once | INTRAMUSCULAR | Status: AC
  Administered 2024-07-23: 20 mg via INTRAVENOUS
  Filled 2024-07-23: qty 2

## 2024-07-23 MED ORDER — OSMOLITE 1.5 CAL PO LIQD
720.0000 mL | ORAL | Status: DC
  Administered 2024-07-23: 720 mL
  Filled 2024-07-23: qty 1000
  Filled 2024-07-23: qty 948
  Filled 2024-07-23 (×2): qty 1000

## 2024-07-23 MED ORDER — FREE WATER
200.0000 mL | Freq: Four times a day (QID) | Status: DC
  Administered 2024-07-24 (×3): 200 mL

## 2024-07-23 NOTE — Plan of Care (Signed)
" °  Problem: Respiratory: Goal: Ability to maintain adequate ventilation will improve Outcome: Progressing   Problem: Clinical Measurements: Goal: Ability to maintain clinical measurements within normal limits will improve Outcome: Progressing Goal: Will remain free from infection Outcome: Progressing   Problem: Activity: Goal: Risk for activity intolerance will decrease Outcome: Progressing   Problem: Nutrition: Goal: Adequate nutrition will be maintained Outcome: Progressing   Problem: Coping: Goal: Level of anxiety will decrease Outcome: Progressing   Problem: Skin Integrity: Goal: Risk for impaired skin integrity will decrease Outcome: Progressing   Problem: Elimination: Goal: Will not experience complications related to bowel motility Outcome: Progressing Goal: Will not experience complications related to urinary retention Outcome: Progressing   "

## 2024-07-23 NOTE — NC FL2 (Signed)
 " [place]  MEDICAID FL2 LEVEL OF CARE FORM     IDENTIFICATION  Patient Name: Servando Kyllonen Birthdate: December 19, 1955 Sex: male Admission Date (Current Location): 07/09/2024  [facility] and [place] Number:  [employer] and Address:  [facility], [address], [place], [place] [address]      Provider Number: [id number]  Attending Physician Name and Address:  Raenelle Donalda HERO, MD  Relative Name and Phone Number:  Shawnta, Zimbelman, Emergency Contact  [phone] [facility])    Current Level of Care: Hospital Recommended Level of Care: Skilled Nursing Facility Prior Approval Number:    Date Approved/Denied:   PASRR Number: [id number]  Discharge Plan: SNF    Current Diagnoses: Patient Active Problem List   Diagnosis Date Noted   Infectious diarrhea 07/14/2024   Hyperbilirubinemia 07/09/2024   Enteritis 07/09/2024   Degeneration of lumbar or lumbosacral intervertebral disc    Aspiration into airway 05/17/2024   Esophageal dysphagia 05/17/2024   SOB (shortness of breath) 05/15/2024   C/O Cirrhosis. Hx of daily Alcohol  consumption 05/15/2024   Mitral regurgitation 03/23/2024   Moderate alcohol  consumption 03/23/2024   Lumbago with sciatica, left side 03/22/2024   Lumbago with sciatica, right side 03/22/2024   Sciatica 03/22/2024   Weakness 12/24/2023   Pneumonia 12/24/2023   Chronic pain 12/24/2023   Sepsis (HCC) 09/11/2023   Difficulty walking 12/11/2022   Foot-drop 12/11/2022   Disorder of spinal region [facility]) 12/11/2022   Muscle atrophy 12/11/2022   Aspiration pneumonia (HCC) 04/12/2022   Pressure injury of skin 04/09/2022   Uremia 04/07/2022   Acute on chronic anemia 04/07/2022   Acute respiratory failure with hypoxia (HCC) 04/07/2022   Septic shock (HCC)    AKI (acute kidney injury)    Hyperkalemia    Acute blood loss anemia    Hematemesis with nausea    Acute metabolic encephalopathy 04/05/2022   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) 03/29/2022   OA (osteoarthritis) of knee 03/28/2022   Arthritis of knee 03/28/2022   Abnormal gait due to muscle weakness 03/28/2021   Urinary retention 03/28/2021   Incomplete paraplegia (HCC) 09/08/2020   Spasticity 09/08/2020   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/08/2020   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/2020   Hypoalbuminemia due to protein-calorie malnutrition    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/2020   Constipation    Neurogenic bowel    Neurogenic bladder    Neuropathic pain    Chronic pain syndrome    S/P spinal fusion 07/20/2020   Cord compression (HCC) 07/20/2020   Degenerative lumbar spinal stenosis 11/23/2019   Dupuytren's contracture of right hand 03/17/2019   BMI 35.0-35.9,adult 03/02/2014   Displacement of lumbar intervertebral disc without myelopathy 03/02/2014   Osteoarthritis of right hip 01/07/2014   Right hip pain 01/07/2014   Lumbar spondylosis 11/03/2013   Sacroiliac dysfunction 11/03/2013   Anxiety    Cobalamin deficiency    Hemorrhoids    Hyperlipidemia    Hypertension    Depression with anxiety    Gouty arthropathy    Vitamin D deficiency    Multiple fractures of ribs of left side 04/20/2013   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/24/2011    Orientation RESPIRATION BLADDER Height & Weight     Self, Time, Place  O2 (nasal canula 2L/min) Incontinent, External catheter Weight: 230 lb 13.2 oz (104.7 kg) Height:  5' 8 (172.7 cm)  BEHAVIORAL SYMPTOMS/MOOD NEUROLOGICAL BOWEL NUTRITION STATUS  Incontinent Diet  AMBULATORY STATUS COMMUNICATION OF NEEDS Skin   Extensive Assist Verbally PU Stage and Appropriate Care (stage 1 vertebral PI, wound of nose, wound on medial buttocks)                       Personal Care Assistance Level of Assistance  Bathing, Feeding, Dressing Bathing Assistance: Maximum assistance Feeding assistance: Maximum assistance Dressing Assistance:  Maximum assistance     Functional Limitations Info             SPECIAL CARE FACTORS FREQUENCY  PT (By licensed PT), OT (By licensed OT)     PT Frequency: 5x/week OT Frequency: 5x/week            Contractures Contractures Info: Not present    Additional Factors Info  Code Status, Allergies, Insulin  Sliding Scale Code Status Info: full Allergies Info: NKA   Insulin  Sliding Scale Info: see d/c summary       Current Medications (07/23/2024):  This is the current hospital active medication list Current Facility-Administered Medications  Medication Dose Route Frequency Provider Last Rate Last Admin   acetaminophen  (TYLENOL ) tablet 650 mg  650 mg Oral Q6H PRN Patel, Vishal R, MD   650 mg at 07/19/24 1743   Or   acetaminophen  (TYLENOL ) suppository 650 mg  650 mg Rectal Q6H PRN Patel, Vishal R, MD       albuterol  (PROVENTIL ) (2.5 MG/3ML) 0.083% nebulizer solution 2.5 mg  2.5 mg Nebulization Q6H PRN Patel, Vishal R, MD   2.5 mg at 07/23/24 1301   allopurinol  (ZYLOPRIM ) tablet 300 mg  300 mg Per Tube Daily Tanda Powell ORN, RPH   300 mg at 07/23/24 0815   Chlorhexidine  Gluconate Cloth 2 % PADS 6 each  6 each Topical Daily Gatha Pence, MD   6 each at 07/21/24 2031   enoxaparin  (LOVENOX ) injection 50 mg  50 mg Subcutaneous Q24H Dang, Thuy D, RPH   50 mg at 07/22/24 2048   ezetimibe  (ZETIA ) tablet 10 mg  10 mg Per Tube QHS Tanda Powell ORN, RPH   10 mg at 07/22/24 2048   famotidine  (PEPCID ) tablet 20 mg  20 mg Per Tube BID Ramaswamy, Murali, MD   20 mg at 07/23/24 0815   feeding supplement (ENSURE PLUS HIGH PROTEIN) liquid 237 mL  237 mL Oral BID BM Raenelle Donalda HERO, MD   237 mL at 07/23/24 0817   feeding supplement (OSMOLITE 1.5 CAL) liquid 1,400 mL  1,400 mL Per Tube Continuous Raenelle Donalda HERO, MD   Stopped at 07/23/24 9167   feeding supplement (PROSource TF20) liquid 60 mL  60 mL Per Tube Daily Mohammed, Shahid, MD   60 mL at 07/23/24 0816   fiber supplement (BANATROL  TF) liquid 60 mL  60 mL Per Tube TID Raenelle Donalda HERO, MD   60 mL at 07/23/24 0816   FLUoxetine  (PROZAC ) capsule 40 mg  40 mg Per Tube Daily Tanda Powell ORN, RPH   40 mg at 07/23/24 0815   fluticasone  (FLONASE ) 50 MCG/ACT nasal spray 1 spray  1 spray Each Nare Daily Sundil, Subrina, MD   1 spray at 07/23/24 0816   free water  200 mL  200 mL Per Tube Q3H Ghimire, Shanker M, MD   200 mL at 07/23/24 1307   insulin  aspart (novoLOG ) injection 0-9 Units  0-9 Units Subcutaneous Q4H Geronimo Amel, MD   1 Units at 07/23/24 0820   metroNIDAZOLE  (FLAGYL ) IVPB 500 mg  500 mg  Intravenous Q8H Syeda, Raeeha, DO 100 mL/hr at 07/23/24 0847 500 mg at 07/23/24 0847   ondansetron  (ZOFRAN ) tablet 4 mg  4 mg Oral Q6H PRN Patel, Vishal R, MD       Or   ondansetron  (ZOFRAN ) injection 4 mg  4 mg Intravenous Q6H PRN Patel, Vishal R, MD       Oral care mouth rinse  15 mL Mouth Rinse 4 times per day Claudene Toribio BROCKS, MD   15 mL at 07/23/24 1307   Oral care mouth rinse  15 mL Mouth Rinse PRN Claudene Toribio BROCKS, MD       rosuvastatin  (CRESTOR ) tablet 5 mg  5 mg Per Tube Daily Tanda Powell ORN, RPH   5 mg at 07/23/24 0815   sodium chloride  flush (NS) 0.9 % injection 3 mL  3 mL Intravenous Q12H Patel, Vishal R, MD   3 mL at 07/23/24 0848   tamsulosin  (FLOMAX ) capsule 0.4 mg  0.4 mg Oral QPC supper Raenelle Donalda HERO, MD   0.4 mg at 07/22/24 1610   thiamine  (VITAMIN B1) tablet 100 mg  100 mg Per Tube Daily Geronimo Amel, MD   100 mg at 07/23/24 0815   vancomycin  (VANCOCIN ) 50 mg/mL oral solution SOLN 500 mg  500 mg Oral Q6H Brovey, Karlen, RPH   500 mg at 07/23/24 1307     Discharge Medications: Please see discharge summary for a list of discharge medications.  Relevant Imaging Results:  Relevant Lab Results:   Additional Information SSN: [id number]  Sharyne Drum, Student-Social Work     "

## 2024-07-23 NOTE — TOC Progression Note (Signed)
 Transition of Care [facility]) - Progression Note    Patient Details  Name: Randall Mccormick MRN: [id number] Date of Birth: 10/18/1955  Transition of [facility]) CM/SW Contact  Landry DELENA Senters, RN Phone Number: 07/23/2024, 11:58 AM  Clinical Narrative:     CM met with patient and wife to determine dispo plans. Patient is unable to participate in conversation at this time. Wife reports she is unable to take him home and care for him with his current needs. She is requesting SNF placement, with [facility] as first choice and [facility] as second.   ICM will continue to follow for medical stability and dispo plan.  Expected Discharge Plan: Home w Home Health Services Barriers to Discharge: Continued Medical Work up               Expected Discharge Plan and Services   Discharge Planning Services: CM Consult Post Acute Care Choice: Home Health Living arrangements for the past 2 months: Single Family Home                           HH Arranged: RN, PT, OT, Speech Therapy HH Agency: [facility] Date [facility] Agency Contacted: 07/21/24 Time HH Agency Contacted: 1222 Representative spoke with at [facility] Agency: [name]   Social Drivers of Health (SDOH) Interventions SDOH Screenings   Food Insecurity: No Food Insecurity (07/11/2024)  Housing: Low Risk (07/13/2024)  Transportation Needs: No Transportation Needs (07/13/2024)  Utilities: Not At Risk (07/13/2024)  Alcohol  Screen: Low Risk (09/12/2022)  Depression (PHQ2-9): Low Risk (09/12/2022)  Financial Resource Strain: Low Risk (09/13/2022)  Physical Activity: Inactive (09/13/2022)  Social Connections: Moderately Integrated (07/13/2024)  Stress: Stress Concern Present (09/13/2022)  Tobacco Use: Medium Risk (06/29/2024)    Readmission Risk Interventions     No data to display

## 2024-07-23 NOTE — TOC Progression Note (Signed)
 Transition of Care [facility]) - Progression Note    Patient Details  Name: Randall Mccormick MRN: [id number] Date of Birth: 1955/08/29  Transition of Care [facility]) CM/SW Contact  Inocente GORMAN Kindle, LCSW Phone Number: 07/23/2024, 2:52 PM  Clinical Narrative:    CSW requested [facility] and [facility] review referral.    Expected Discharge Plan: Skilled Nursing Facility Barriers to Discharge: Continued Medical Work up, SNF Pending bed offer               Expected Discharge Plan and Services In-house Referral: Clinical Social Work Discharge Planning Services: CM Consult Post Acute Care Choice: Skilled Nursing Facility Living arrangements for the past 2 months: Single Family Home                           HH Arranged: RN, PT, OT, Speech Therapy HH Agency: [facility] Date [facility] Agency Contacted: 07/21/24 Time HH Agency Contacted: 1222 Representative spoke with at [facility] Agency: [name]   Social Drivers of Health (SDOH) Interventions SDOH Screenings   Food Insecurity: No Food Insecurity (07/11/2024)  Housing: Low Risk (07/13/2024)  Transportation Needs: No Transportation Needs (07/13/2024)  Utilities: Not At Risk (07/13/2024)  Alcohol  Screen: Low Risk (09/12/2022)  Depression (PHQ2-9): Low Risk (09/12/2022)  Financial Resource Strain: Low Risk (09/13/2022)  Physical Activity: Inactive (09/13/2022)  Social Connections: Moderately Integrated (07/13/2024)  Stress: Stress Concern Present (09/13/2022)  Tobacco Use: Medium Risk (06/29/2024)    Readmission Risk Interventions    07/23/2024    2:44 PM  Readmission Risk Prevention Plan  Transportation Screening Complete  Medication Review (RN Care Manager) Complete  PCP or Specialist appointment within 3-5 days of discharge Complete  HRI or Home Care Consult Complete  SW Recovery Care/Counseling Consult Complete  Palliative Care Screening Not Applicable  Skilled Nursing Facility Complete

## 2024-07-23 NOTE — Progress Notes (Signed)
 MEWS Progress Note  Patient Details Name: Randall Mccormick MRN: [id number] DOB: 01/01/1956 Today's Date: 07/23/2024   MEWS Flowsheet Documentation:  Assess: MEWS Score Temp: 99 F (37.2 C) BP: 130/75 MAP (mmHg): 88 Pulse Rate: 100 ECG Heart Rate: 100 Resp: (!) 48 Level of Consciousness: Alert SpO2: 94 % O2 Device: Nasal Cannula Patient Activity (if Appropriate): In bed O2 Flow Rate (L/min): 2 L/min FiO2 (%): 40 % Assess: MEWS Score MEWS Temp: 0 MEWS Systolic: 0 MEWS Pulse: 0 MEWS RR: 3 MEWS LOC: 0 MEWS Score: 3 MEWS Score Color: Yellow Assess: SIRS CRITERIA SIRS Temperature : 0 SIRS Respirations : 1 SIRS Pulse: 1 SIRS WBC: 1 SIRS Score Sum : 3 SIRS Temperature : 0 SIRS Pulse: 1 SIRS Respirations : 1 SIRS WBC: 1 SIRS Score Sum : 3 Assess: if the MEWS score is Yellow or Red Were vital signs accurate and taken at a resting state?: Yes Does the patient meet 2 or more of the SIRS criteria?: Yes Does the patient have a confirmed or suspected source of infection?: Yes MEWS guidelines implemented : Yes, yellow Treat MEWS Interventions: Considered administering scheduled or prn medications/treatments as ordered Take Vital Signs Increase Vital Sign Frequency : Yellow: Q2hr x1, continue Q4hrs until patient remains green for 12hrs Escalate MEWS: Escalate: Yellow: Discuss with charge nurse and consider notifying provider and/or RRT        Joana CINDERELLA Pouch 07/23/2024, 3:58 PM

## 2024-07-23 NOTE — Progress Notes (Addendum)
 Nutrition Follow-up  DOCUMENTATION CODES:  Not applicable  INTERVENTION:  Increase Ensure Plus High Protein to TID, each supplement provides 350 kcal and 20 grams of protein Continue Magic cup TID with meals, each supplement provides 290 kcal and 9 grams of protein D/C calorie count.  Nocturnal TF via Cortrak: Osmolite 1.5 at 60 ml/h x 12 h Prosource TF20 60 ml daily Free water  flushes 200 ml q6h Provides 1160 kcal (55% of minimum estimated needs), 65 gm protein (62% of minimum estimated needs), and 1349 ml free water  daily (TF+flushes) Continue Banatrol TID-provides 45 kcal, 5g soluble fiber and 2g protein per serving.  NUTRITION DIAGNOSIS:  Inadequate oral intake related to inability to eat as evidenced by NPO status; ongoing.  GOAL:  Patient will meet greater than or equal to 90% of their needs; met with TF + PO intake.   MONITOR:  PO intake, Supplement acceptance, Diet advancement, TF tolerance  REASON FOR ASSESSMENT:  Ventilator, Consult Enteral/tube feeding initiation and management  ASSESSMENT:  Pt with hx of HTN, HLD, incomplete paraplegia with neurogenic bowel/bladder,   presented to ED with fever, bloody diarrhea, and AMS. Found to be positive for Clostridium difficile after multiple recent rounds of antibiotics.  Calorie count: 1/29 Breakfast: 300 kcal, 9 gm protein 1/29 Lunch: 137 kcal, 7 gm protein 1/29 Dinner: 99 kcal, 11 gm protein  Supplements: 350 kcal, 20 gm protein  Total: 886 kcal (42% of minimum estimated needs), 47 gm protein (45% of minimum estimated needs).  Current nocturnal TF: Osmolite 1.5 at 100 ml/h x 14 hours with Prosource TF20 60 ml once daily provides 2180 kcal, 108 gm protein, 1067 ml free water  daily. Free water  flushes 200 ml q3h for a total of 2667 ml free water  daily.  Will decrease nocturnal TF since oral intake has improved.   Labs reviewed. CBG: [id number]  Medications reviewed and include pepcid , banatrol TF, novolog , flomax ,  thiamine , flagyl .   Diet Order:   Diet Order             DIET - DYS 1 Fluid consistency: Nectar Thick  Diet effective now                  EDUCATION NEEDS:  Education needs have been addressed  Skin:  Skin Assessment: Skin Integrity Issues: Skin Integrity Issues:: Stage I Stage I: vertebral column  Last BM:  1/30 type 7  Height:  Ht Readings from Last 1 Encounters:  07/14/24 5' 8 (1.727 m)   Weight:  Wt Readings from Last 1 Encounters:  07/22/24 104.7 kg   Ideal Body Weight:  63.6 kg  BMI:  Body mass index is 35.1 kg/m.  Estimated Nutritional Needs:  Kcal:  2100-2300 kcal/d Protein:  105-120g/d Fluid:  2.2 L/d   Suzen HUNT RD, LDN, CNSC Contact via secure chat. If unavailable, use group chat RD Inpatient.

## 2024-07-24 ENCOUNTER — Inpatient Hospital Stay (HOSPITAL_COMMUNITY): Payer: MEDICARE

## 2024-07-24 DIAGNOSIS — J9601 Acute respiratory failure with hypoxia: Secondary | ICD-10-CM | POA: Diagnosis not present

## 2024-07-24 DIAGNOSIS — G9341 Metabolic encephalopathy: Secondary | ICD-10-CM | POA: Diagnosis not present

## 2024-07-24 DIAGNOSIS — E870 Hyperosmolality and hypernatremia: Secondary | ICD-10-CM | POA: Diagnosis not present

## 2024-07-24 DIAGNOSIS — G928 Other toxic encephalopathy: Secondary | ICD-10-CM | POA: Diagnosis not present

## 2024-07-24 DIAGNOSIS — N179 Acute kidney failure, unspecified: Secondary | ICD-10-CM | POA: Diagnosis not present

## 2024-07-24 DIAGNOSIS — G894 Chronic pain syndrome: Secondary | ICD-10-CM | POA: Diagnosis not present

## 2024-07-24 DIAGNOSIS — G8221 Paraplegia, complete: Secondary | ICD-10-CM | POA: Diagnosis not present

## 2024-07-24 DIAGNOSIS — A4151 Sepsis due to Escherichia coli [E. coli]: Secondary | ICD-10-CM | POA: Diagnosis not present

## 2024-07-24 DIAGNOSIS — F32A Depression, unspecified: Secondary | ICD-10-CM | POA: Diagnosis not present

## 2024-07-24 DIAGNOSIS — R6520 Severe sepsis without septic shock: Secondary | ICD-10-CM | POA: Diagnosis not present

## 2024-07-24 DIAGNOSIS — K529 Noninfective gastroenteritis and colitis, unspecified: Secondary | ICD-10-CM | POA: Diagnosis not present

## 2024-07-24 LAB — BASIC METABOLIC PANEL WITH GFR
Anion gap: 8 (ref 5–15)
BUN: 14 mg/dL (ref 8–23)
CO2: 30 mmol/L (ref 22–32)
Calcium: 7.9 mg/dL — ABNORMAL LOW (ref 8.9–10.3)
Chloride: 103 mmol/L (ref 98–111)
Creatinine, Ser: 0.5 mg/dL — ABNORMAL LOW (ref 0.61–1.24)
GFR, Estimated: >60 mL/min
Glucose, Bld: 133 mg/dL — ABNORMAL HIGH (ref 70–99)
Potassium: 4.2 mmol/L (ref 3.5–5.1)
Sodium: 141 mmol/L (ref 135–145)

## 2024-07-24 LAB — CBC
HCT: 38.3 % — ABNORMAL LOW (ref 39.0–52.0)
Hemoglobin: 12.6 g/dL — ABNORMAL LOW (ref 13.0–17.0)
MCH: 32.1 pg (ref 26.0–34.0)
MCHC: 32.9 g/dL (ref 30.0–36.0)
MCV: 97.5 fL (ref 80.0–100.0)
Platelets: 535 10*3/uL — ABNORMAL HIGH (ref 150–400)
RBC: 3.93 MIL/uL — ABNORMAL LOW (ref 4.22–5.81)
RDW: 17 % — ABNORMAL HIGH (ref 11.5–15.5)
WBC: 19.3 10*3/uL — ABNORMAL HIGH (ref 4.0–10.5)
nRBC: 0 % (ref 0.0–0.2)

## 2024-07-24 LAB — GLUCOSE, CAPILLARY
Glucose-Capillary: 131 mg/dL — ABNORMAL HIGH (ref 70–99)
Glucose-Capillary: 158 mg/dL — ABNORMAL HIGH (ref 70–99)
Glucose-Capillary: 113 mg/dL — ABNORMAL HIGH (ref 70–99)
Glucose-Capillary: 129 mg/dL — ABNORMAL HIGH (ref 70–99)

## 2024-07-24 LAB — BLOOD GAS, ARTERIAL
Acid-Base Excess: 6.9 mmol/L — ABNORMAL HIGH (ref 0.0–2.0)
Bicarbonate: 33 mmol/L — ABNORMAL HIGH (ref 20.0–28.0)
O2 Saturation: 96.3 %
Patient temperature: 37.1
pCO2 arterial: 52 mmHg — ABNORMAL HIGH (ref 32–48)
pH, Arterial: 7.41 (ref 7.35–7.45)
pO2, Arterial: 68 mmHg — ABNORMAL LOW (ref 83–108)

## 2024-07-24 MED ORDER — GLYCOPYRROLATE 0.2 MG/ML IJ SOLN: 0.2000 mg | INTRAMUSCULAR | Status: DC | PRN

## 2024-07-24 MED ORDER — LORAZEPAM 2 MG/ML PO CONC: 1.0000 mg | ORAL | Status: DC | PRN

## 2024-07-24 MED ORDER — SODIUM CHLORIDE 0.9% FLUSH: 3.0000 mL | INTRAVENOUS | Status: DC | PRN

## 2024-07-24 MED ORDER — FUROSEMIDE 10 MG/ML IJ SOLN
20.0000 mg | Freq: Once | INTRAMUSCULAR | Status: AC
  Administered 2024-07-24: 20 mg via INTRAVENOUS
  Filled 2024-07-24: qty 2

## 2024-07-24 MED ORDER — MORPHINE 100MG IN NS 100ML (1MG/ML) PREMIX INFUSION
1.0000 mg/h | INTRAVENOUS | Status: DC
  Administered 2024-07-24: 2 mg/h via INTRAVENOUS
  Filled 2024-07-24: qty 100

## 2024-07-24 MED ORDER — HALOPERIDOL 0.5 MG PO TABS: 0.5000 mg | ORAL_TABLET | ORAL | Status: DC | PRN

## 2024-07-24 MED ORDER — ALBUTEROL SULFATE (2.5 MG/3ML) 0.083% IN NEBU: 2.5000 mg | INHALATION_SOLUTION | RESPIRATORY_TRACT | Status: DC | PRN

## 2024-07-24 MED ORDER — HALOPERIDOL LACTATE 5 MG/ML IJ SOLN
0.5000 mg | INTRAMUSCULAR | Status: DC | PRN
  Administered 2024-07-25: 0.5 mg via INTRAVENOUS
  Filled 2024-07-24: qty 1

## 2024-07-24 MED ORDER — GLYCOPYRROLATE 1 MG PO TABS: 1.0000 mg | ORAL_TABLET | ORAL | Status: DC | PRN

## 2024-07-24 MED ORDER — ONDANSETRON 4 MG PO TBDP: 4.0000 mg | ORAL_TABLET | Freq: Four times a day (QID) | ORAL | Status: DC | PRN

## 2024-07-24 MED ORDER — POLYVINYL ALCOHOL 1.4 % OP SOLN: 1.0000 [drp] | Freq: Four times a day (QID) | OPHTHALMIC | Status: DC | PRN

## 2024-07-24 MED ORDER — FUROSEMIDE 10 MG/ML IJ SOLN: 40.0000 mg | Freq: Once | INTRAMUSCULAR | Status: AC

## 2024-07-24 MED ORDER — FUROSEMIDE 10 MG/ML IJ SOLN
INTRAMUSCULAR | Status: AC
  Administered 2024-07-24: 40 mg via INTRAVENOUS
  Filled 2024-07-24: qty 4

## 2024-07-24 MED ORDER — SCOPOLAMINE 1 MG/3DAYS TD PT72
1.0000 | MEDICATED_PATCH | TRANSDERMAL | Status: DC
  Administered 2024-07-24: 1 mg via TRANSDERMAL
  Filled 2024-07-24: qty 1

## 2024-07-24 MED ORDER — MORPHINE SULFATE (PF) 2 MG/ML IV SOLN
INTRAVENOUS | Status: AC
  Administered 2024-07-24: 2 mg via INTRAVENOUS
  Filled 2024-07-24: qty 1

## 2024-07-24 MED ORDER — GLYCOPYRROLATE 0.2 MG/ML IJ SOLN: 0.3000 mg | INTRAMUSCULAR | Status: DC | PRN

## 2024-07-24 MED ORDER — MORPHINE BOLUS VIA INFUSION
1.0000 mg | INTRAVENOUS | Status: DC | PRN
  Administered 2024-07-25: 2 mg via INTRAVENOUS

## 2024-07-24 MED ORDER — HALOPERIDOL LACTATE 2 MG/ML PO CONC: 0.5000 mg | ORAL | Status: DC | PRN

## 2024-07-24 MED ORDER — SODIUM CHLORIDE 0.9% FLUSH
3.0000 mL | Freq: Two times a day (BID) | INTRAVENOUS | Status: DC
  Administered 2024-07-24: 3 mL via INTRAVENOUS

## 2024-07-24 MED ORDER — ONDANSETRON HCL 4 MG/2ML IJ SOLN: 4.0000 mg | Freq: Four times a day (QID) | INTRAMUSCULAR | Status: DC | PRN

## 2024-07-24 MED ORDER — SODIUM CHLORIDE 0.9 % IV SOLN: 250.0000 mL | INTRAVENOUS | Status: DC | PRN

## 2024-07-24 MED ORDER — MORPHINE SULFATE (PF) 2 MG/ML IV SOLN: 2.0000 mg | Freq: Once | INTRAVENOUS | Status: AC

## 2024-07-24 MED ORDER — LORAZEPAM 1 MG PO TABS: 1.0000 mg | ORAL_TABLET | ORAL | Status: DC | PRN

## 2024-07-24 MED ORDER — FUROSEMIDE 8 MG/ML PO SOLN
40.0000 mg | Freq: Once | ORAL | Status: DC
  Filled 2024-07-24: qty 5

## 2024-07-24 MED ORDER — LORAZEPAM 2 MG/ML IJ SOLN
1.0000 mg | INTRAMUSCULAR | Status: DC | PRN
  Administered 2024-07-24 – 2024-07-25 (×2): 1 mg via INTRAVENOUS

## 2024-07-24 MED ORDER — ALLOPURINOL 300 MG PO TABS: 300.0000 mg | ORAL_TABLET | Freq: Every day | ORAL | Status: DC

## 2024-07-24 MED ORDER — LORAZEPAM 2 MG/ML IJ SOLN
1.0000 mg | INTRAMUSCULAR | Status: DC | PRN
  Filled 2024-07-24 (×2): qty 1

## 2024-07-24 MED ORDER — SALINE SPRAY 0.65 % NA SOLN: 1.0000 | NASAL | Status: DC | PRN

## 2024-07-24 MED ORDER — OXYMETAZOLINE HCL 0.05 % NA SOLN
1.0000 | Freq: Two times a day (BID) | NASAL | Status: DC
  Administered 2024-07-24: 1 via NASAL
  Filled 2024-07-24: qty 30

## 2024-07-24 NOTE — Plan of Care (Signed)
" °  Problem: Fluid Volume: Goal: Hemodynamic stability will improve Outcome: Progressing   Problem: Clinical Measurements: Goal: Diagnostic test results will improve Outcome: Progressing Goal: Signs and symptoms of infection will decrease Outcome: Progressing   Problem: Respiratory: Goal: Ability to maintain adequate ventilation will improve Outcome: Progressing   Problem: Clinical Measurements: Goal: Ability to maintain clinical measurements within normal limits will improve Outcome: Progressing Goal: Will remain free from infection Outcome: Progressing Goal: Diagnostic test results will improve Outcome: Progressing Goal: Respiratory complications will improve Outcome: Progressing Goal: Cardiovascular complication will be avoided Outcome: Progressing   Problem: Activity: Goal: Risk for activity intolerance will decrease Outcome: Progressing   Problem: Nutrition: Goal: Adequate nutrition will be maintained Outcome: Progressing   Problem: Coping: Goal: Level of anxiety will decrease Outcome: Progressing   Problem: Safety: Goal: Ability to remain free from injury will improve Outcome: Progressing   Problem: Skin Integrity: Goal: Risk for impaired skin integrity will decrease Outcome: Progressing   Problem: Elimination: Goal: Will not experience complications related to bowel motility Outcome: Progressing Goal: Will not experience complications related to urinary retention Outcome: Progressing   Problem: Pain Managment: Goal: General experience of comfort will improve and/or be controlled Outcome: Progressing   "

## 2024-07-24 NOTE — Progress Notes (Signed)
 Pt has a history of breathing fast at times while awake.

## 2024-07-24 NOTE — Progress Notes (Signed)
 "  NAME:  Theoplis Garciagarcia, MRN:  [id number], DOB:  12/12/1955, LOS: 15 ADMISSION DATE:  07/09/2024, CONSULTATION DATE:  07/14/24 REFERRING MD: None , CHIEF COMPLAINT:  Respiratory Decline   History of Present Illness:  Patient is a Quinnlan Abruzzo is a 69 y.o. 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  In the ED, patient was tachycardic with fever.  Labs showed creatinine of 1.0.  Lactate of 2.3.  Total bilirubin of 2.6.  COVID influenza and RSV was negative.  Chest x-ray showed cardiomegaly.  CT scan of the abdomen pelvis showed fluid throughout the colon consistent with diarrhea/enteritis. He was tested positive for C. Diff and was being treated with PO vancomycin .  Pertinent  Medical History  Per above   Significant Hospital Events: Including procedures, antibiotic start and stop dates in addition to other pertinent events   1/16: admitted  Blood culture negative flu and COVID PCR negative Stool C. difficile antigen and toxin positive 1/20: PCCM was consulted to evaluate the pt for not being responsive and having increased WOB. Pt was transitioned from [place] to Bipap. PCCM recommended pt being monitored on the floor as he was tolerating bipap well and was maintaining his airway, with low threshold to move to ICU if need be.  1/21: Intubated and admitted to ICU for decline in respiratory status  Respiratory culture with moderate Candida albicans MRSA PCR negative 1/23 - Saw pt at bedside. Opens eyes. Responding to noxious stimuli. Doesn't follow commands  Copious diarrhea:  Changed from oral vancomycin  to higher dose oral vancomycin  associated with IV Flagyl  Lactate and abdominal x-ray within normal limits. Core track left naris introduced yesterday 1/24: Remains on ventilator 50% FiO2.   On tube feeds through core track.  On fentanyl  infusion.  On low-dose Levophed  infusion.  Renal function holding.  Phosphorus very low.  Fever still present.?  Plateauing.  White count 30,900 and slightly higher but? Plateauing. Turned all sedation gtt off. STil with diarrhe aper RN - but unclear if better 250cc overnight per RN 1/27: extubated and HHFNC; did well post extubation  1/28 to TRH 1/31 PCCM reconsulted for increased work of breathing, ABG reassuring. GOC discussion held with wife per [facility] decision made to change code status to DNR/DNI  Interim History / Subjective:  Sleepy but arousable No family at bedsdie    Objective    Blood pressure 107/63, pulse 99, temperature 98.1 F (36.7 C), temperature source Oral, resp. rate (!) 33, height 5' 8 (1.727 m), weight 105 kg, SpO2 94%.        Intake/Output Summary (Last 24 hours) at 07/24/2024 1013 Last data filed at 07/24/2024 0855 Gross per 24 hour  Intake 2426 ml  Output 2000 ml  Net 426 ml   Filed Weights   07/21/24 0336 07/22/24 0500 07/24/24 0441  Weight: 101.5 kg 104.7 kg 105 kg    Examination: General: Acute on chronically ill appearing middle aged male lying in bed in NAD   HEENT: [place]/AT, MM pink/moist, PERRL,  Neuro: Arousable CV: s1s2 regular rate and rhythm, no murmur, rubs, or gallops,  PULM:  Slight increased work of breathing with accessory muscle use seen, on 2L  with sats 92-94% GI: soft, bowel sounds active in all 4 quadrants, non-tender, non-distended, tolerating TF Extremities: warm/dry, no edema  Skin: no rashes or lesions  Resolved problem list  Respiratory decline 1/20,  intubated 07/14/24 Acute resp failure mixed groups 1/2  Assessment and Plan  Acute on chronic hypoxic respiratory failure  -Required intubation during admission but was successfully extubated 1/27. Am 1/31 patient is seen with increased work of breathing with accessory muscle use.  -Some concern for pulmonary edema on CXR with  positive fluid balance  P: Breathing pattern appears encephalopathic ABG is reassuring  Agree with gentle diuretics, of tolerated well consider additional evening dose  Can consider BiPAP as needed for work of breathing if it aligns with GOC  and patient has the mentation to remove if he becomes nauseous. Would be high risk given decreased mentation and TF vi cortrack Limit sedating medications As needed bronchodilators  Aspiration precautions  No indication to move back to ICU as family has elected for DNI status  Ongoing medical problems managed per primary: Toxic metabolic Encephalopathy Incomplete paraplegia with neurogenic bladder Hx of in-hospital prolonged encephalopathy Severe sepsis due to C. difficile enteritis, POA Depression on Prozac , Xanax  prn and Zygprexa scheduleds Chronic pain - on oxy Chronic gout -0n allopurinoil Hyperlipidemia: AKI likely 2/2 to sepsis per below  Hypernatremia 07/18/24   Signature:  Cosandra Plouffe D. Harris, NP-C  Pulmonary & Critical Care Personal contact information can be found on Amion  If no contact or response made please call [phone] 07/24/2024, 12:33 PM   "

## 2024-07-24 NOTE — Progress Notes (Signed)
 RT called for patient in respiratory distress.  Per RN, patient aspirated after being fed ice cream by his wife.  SPO2 was in the mid 80s on NRB.  RT and Rapid Response Nurse performed nasal tracheal suctioning with a large amount of secretions noted. Patient's SPO2 increased to low 90s.  Albuterol  tx given. MD arrived at bedside to discuss plan of care with patient's wife.  RT will continue to monitor as needed.

## 2024-07-24 NOTE — Consult Note (Signed)
 "                                                  Palliative Care Consult Note                                  Date: 07/24/2024   Patient Name: Randall Mccormick  DOB: 1955/10/10  MRN: [id number]  Age / Sex: 69 y.o., male  PCP: Keren [name], MD Referring Physician: Raenelle Donalda HERO, MD  Reason for Consultation: Establishing goals of care  HPI/Patient Profile: Palliative Care consult requested for goals of care discussion in this 69 y.o. 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 On work-up patient found to febrile (104), C-difficile positive. Palliative consulted for goals of care discussions.   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 Hx of daily Alcohol  consumption 05/15/2024   Chronic pain 12/24/2023   Chronic pain syndrome    Cobalamin deficiency    Constipation    Cord compression (HCC) 07/20/2020   Degeneration of lumbar or lumbosacral intervertebral disc    Degenerative lumbar spinal stenosis 11/23/2019   Depression    Difficulty walking 12/11/2022   Disorder of spinal region [facility]) 12/11/2022   Displacement of lumbar intervertebral disc without myelopathy 03/02/2014   Dupuytren's contracture of right hand 03/17/2019   Esophageal dysphagia 05/17/2024    Foot-drop 12/11/2022   Gouty arthropathy    IMO SNOMED Dx Update Oct 2024     Hematemesis with nausea    Hemorrhoids    Hyperkalemia    Hyperlipidemia    Hypertension    Hypoalbuminemia due to protein-calorie malnutrition    Incomplete paraplegia (HCC) 09/08/2020   Low back pain 05/12/2023   Lumbago with sciatica, left side 03/22/2024   Lumbago with sciatica, right side 03/22/2024   Lumbar spondylosis 11/03/2013   Mitral regurgitation 03/23/2024   Moderate alcohol  consumption 03/23/2024   Multiple fractures of ribs of left side 04/20/2013   FALL FROM A LADDER ON    Muscle atrophy 12/11/2022   Neurogenic bladder    Neurogenic bowel    Neuropathic pain    OA (osteoarthritis) of knee 03/28/2022   Osteoarthritis of right hip 01/07/2014   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/24/2011   Pneumonia 12/24/2023   Pressure injury of skin 04/09/2022   Right hip pain 01/07/2014   S/P spinal fusion 07/20/2020   Sacroiliac dysfunction 11/03/2013   Sciatica 03/22/2024   Sepsis (HCC) 09/11/2023   Septic shock (HCC)    SOB (shortness of breath) 05/15/2024   Spasticity 09/08/2020   Thoracic disc disease with myelopathy  07/28/2020   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/2020   Uremia 04/07/2022   Urinary retention 03/28/2021   Vitamin D deficiency    Weakness 12/24/2023   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/08/2020     Subjective:   This NP Levon Freud reviewed medical records, received report from team, assessed the patient and then met at the patient's bedside with [name]. Patient resting comfortably but will arise to verbal stimuli. Denies pain with head nods. No family present. I was able to connect with patient's wife, Randall Mccormick to discuss diagnosis, prognosis, GOC, EOL wishes disposition and options.  [name] is awake and alert however due to acuity of condition and inability to thoroughly assess mentation he is not able to appropriately engage  in discussions.    Concept of Palliative Care was introduced as specialized medical care for people and their families living with serious illness.  It focuses on providing relief from the symptoms and stress of a serious illness.  The goal is to improve quality of life for both the patient and the family. Values and goals of care important to patient and family were attempted to be elicited.  I created space and opportunity for wife to explore state of health prior to admission, thoughts, and feelings. His social history includes a long marriage of 48 years, with one surviving daughter who lives in [place]  and is currently traveling to hospital out of concern of Mr. Fosnaugh change in health status. He has three granddaughters. His past occupation was a engineer, maintenance, which involved heavy lifting which wife attributes to his current physical condition.  Wife share patient has a history of multiple back surgeries, significantly impacting his mobility. At home, he can walk very short distances with a rollator but requires assistance with bathing and dressing. He spends most of his daytime in a recliner.  He has experienced recurrent episodes of pneumonia over the past two years, often leading to hospital admissions. During these episodes, he frequently exhibited confusion. His wife reports ongoing confusion since his current hospital admission.  In terms of nutrition, he typically consumes breakfast, including cereal or a sausage and egg biscuit, and sometimes a nutritional drink if he does not want solid food. Lunch is often a sandwich or leftovers, and dinner is a cooked meal. Over the past three months, he has had difficulty swallowing both food and pills, managed by his wife giving him pills in applesauce and preparing soft foods.  We discussed His current illness and what it means in the larger context of His on-going co-morbidities. Natural disease trajectory and expectations were  discussed.  [name] verbalized understanding of current illness and co-morbidities. She is realistic in her awareness of poor overall prognosis and concerns that [name] may require emergent interventions specifically intubation given noted changes in respiratory status today.   We discussed what Daire would want for himself based on his quality of life. His wife is emotional sharing they have discussed his wishes in the past knowing this day would come! She chooses to support his decisions and is clear in expressed wishes that she desires for her husband not to suffer. Mrs. Wilmes has contacted their daughter who is driving in from [facility] to support any decision making needs.   At this time wife would like to continue with current treatments. No escalation. If patient was to decline she desires all care to transition on focusing on his comfort and minimizing andy symptom burden decreasing any level of suffering. She is  hopeful their daughter will have time to arrive.   I discussed the importance of continued conversation with family and their medical providers regarding overall plan of care and treatment options, ensuring decisions are within the context of the patients values and GOCs.  Questions and concerns were addressed. The wife was encouraged to call with questions or concerns.  PMT will continue to support holistically as needed.  1715: Received notification patient has had a significant aspiration event and is rapidly declining. Dr. Raenelle and the team have updated wife who is at the bedside. Mrs. [place] has made difficult decision to transition all care to focus on comfort allowing her husband to be at peace and minimize distress. Support provided.   Objective:   Primary Diagnoses: Present on Admission:  Chronic pain syndrome  Hyperlipidemia  Hypertension  Incomplete paraplegia (HCC)  Hyperbilirubinemia  Enteritis  Depression with anxiety  Sepsis (HCC)  Gouty arthropathy   Acute respiratory failure with hypoxia (HCC)   Scheduled Meds:  [START ON 08/13/2024] allopurinol   300 mg Oral Daily   Chlorhexidine  Gluconate Cloth  6 each Topical Daily   enoxaparin  (LOVENOX ) injection  50 mg Subcutaneous Q24H   ezetimibe   10 mg Per Tube QHS   famotidine   20 mg Per Tube BID   feeding supplement  237 mL Oral BID BM   feeding supplement (PROSource TF20)  60 mL Per Tube Daily   fiber supplement (BANATROL TF)  60 mL Per Tube TID   FLUoxetine   40 mg Per Tube Daily   fluticasone   1 spray Each Nare Daily   free water   200 mL Per Tube Q6H   insulin  aspart  0-9 Units Subcutaneous Q4H   mouth rinse  15 mL Mouth Rinse 4 times per day   oxymetazoline   1 spray Each Nare BID   rosuvastatin   5 mg Per Tube Daily   sodium chloride  flush  3 mL Intravenous Q12H   tamsulosin   0.4 mg Oral QPC supper   thiamine   100 mg Per Tube Daily   vancomycin   500 mg Oral Q6H    Continuous Infusions:  feeding supplement (OSMOLITE 1.5 CAL) Stopped (07/24/24 0855)   metronidazole  500 mg (07/24/24 0900)    PRN Meds: acetaminophen  **OR** acetaminophen , albuterol , ondansetron  **OR** ondansetron  (ZOFRAN ) IV, mouth rinse, sodium chloride   Allergies[1]  Review of Systems  Unable to perform ROS: Acuity of condition   Physical Exam General: frail chronically-ill appearing Cardiovascular: Tachycardic  Pulmonary: Tachypnea, diminished bilaterally, accessory muscle use  Abdomen: soft, nontender, + bowel sounds, Coretrak in place Skin: no rashes, warm and dry Neurological: awake and alert, nods head, unable to determine mentation   Vital Signs:  BP 107/63 (BP Location: Left Arm)   Pulse 99   Temp 98.1 F (36.7 C) (Oral)   Resp (!) 33   Ht 5' 8 (1.727 m)   Wt 105 kg   SpO2 94%   BMI 35.20 kg/m  Pain Scale: Faces   Pain Score: 0-No pain  SpO2: SpO2: 94 % O2 Device:SpO2: 94 % O2 Flow Rate: .O2 Flow Rate (L/min): 2 L/min  IO: Intake/output summary:  Intake/Output Summary (Last 24  hours) at 07/24/2024 1109 Last data filed at 07/24/2024 0855 Gross per 24 hour  Intake 2426 ml  Output 2000 ml  Net 426 ml    LBM: Last BM Date : 07/23/24 Baseline Weight: Weight: 99.8 kg Most recent weight: Weight: 105 kg      Palliative Assessment/Data: PPS 10%   Clinical Assessment and  Goals of Care:  I have reviewed medical records including: EPIC notes: Reviewed progress/consult notes from critical care detailing treatment plan, emphasis on treating. Reviewed to track clinical course and prognostication.  Vital signs: Tachycardic, Tachypnea, accessory muscle use  MAR: Reviewed PRN meds received over the last 24 hours. Reviewed to assess needs for medication adjustment to optimize comfort.  Available advanced directives in ACP: None on file  Labs: CBC, CMP, blood gas, Reviewed to assist with prescribing and prognostication  Primary Decision Maker: NEXT OF KIN  Code Status/Advance Care Planning: DNR  Code Status: Concepts specific to code status, artifical feeding and hydration, continued IV antibiotics and rehospitalization was held.  The difference between a aggressive medical intervention and a palliative comfort care path was discussed.  Assessment & Plan:   SUMMARY OF RECOMMENDATIONS   DNR/DNI-as confirmed by wife  Transition all care to focus on comfort after discussions with medical team and aspiration event. Anticipated hospital death.  Extensive goals of care discussions. Wife is realistic in her understand. No desire for suffering. Wishes to minimize as best possible.  PMT will continue to support and follow as needed. Please call team line with urgent unmet palliative needs.  Symptom Management:  Per Attending Morphine  infusion (titrate for symptom management)  Morphine  boluses via IV PRN for pain/air hunger/comfort Robinul  PRN for excessive secretions Ativan  PRN for agitation/anxiety Zofran  PRN for nausea Liquifilm tears PRN for dry eyes Haldol  PRN for  agitation/anxiety Comfort cart for family Unrestricted visitations in the setting of EOL (per policy) Oxygen PRN 2L or less for comfort. No escalation.    Palliative Prophylaxis:  Aspiration, Bowel Regimen, Frequent Pain Assessment, Oral Care, Palliative Wound Care, and Turn Reposition  Additional Recommendations (Limitations, Scope, Preferences): Full Comfort Care  Psycho-social/Spiritual:  Desire for further Chaplaincy support: yes Additional Recommendations: Comfort focus   Prognosis:  Hours - Days  Discharge Planning:  Anticipated Hospital Death   Discussed with: Dr. Raenelle via secure chat.   [name], [name] expressed understanding and was in agreement with this plan.    I personally spent a total of 70 minutes in the care of the patient today including preparing to see the patient, getting/reviewing separately obtained history, performing a medically appropriate exam/evaluation, counseling and educating, placing orders, referring and communicating with other health care professionals, documenting clinical information in the EHR, and coordinating care. Visit consisted of counseling and education dealing with the complex and emotionally intense issues of symptom management and palliative care in the setting of serious and potentially life-threatening illness.  Signed by:  Levon Borer, AGPCNP-BC Palliative Medicine TeamWL [facility]   Phone: [phone] Pager: [phone] Amion: GEANNIE Freud   Thank you for allowing the Palliative Medicine Team to assist in the care of this patient. Please utilize secure chat with additional questions, if there is no response within 30 minutes please call the above phone number. Palliative Medicine Team providers are available by phone from 7am to 5pm daily and can be reached through the team cell phone.  Should this patient require assistance outside of these hours, please call the patient's attending  physician.                  [1] No Known Allergies  "

## 2024-07-24 NOTE — Significant Event (Signed)
 Covering for Dr. Raenelle.    Patient had acute event and tachypnea with lethargic after eating.  Attended patient at the bedside.  Patient looked very uncomfortable, breathing with 26-32 times per minute.  Currently on nonrebreather.  Patient is unable to respond.  Looks very uncomfortable.  Bilateral chest exam with poor air entry.  Discussed with primary attending.  Discussed with patient's wife at the bedside.  They also have a family friend visiting them.  [name] recalled previous discussion with primary attending as well as pulmonary about patient's poor status, current work of breathing and chronic illness that is very much against his wishes and family wishes.  They do not want him to suffer and do not want him to go through more intervention or intubation that is appropriate given his severe clinical condition.   Discussed with patient's wife, she asked what makes him comfortable.  We offered a comfort care pathway including all symptom control medications including relieving pain, suffering, anxiety, air hunger.  Will give a dose of Lasix  40 mg once, a dose of morphine  2 mg once and then start him on morphine  infusion. Continue breathing therapies and all supportive care. Given patient's serious critical condition, I advised family to visit him as much as possible. RN may pronounce death if happens in the hospital. Comfort care measures.

## 2024-07-24 NOTE — Progress Notes (Signed)
 Pt unable to per NIF or VC

## 2024-07-25 DIAGNOSIS — Z515 Encounter for palliative care: Secondary | ICD-10-CM

## 2024-07-25 DIAGNOSIS — G894 Chronic pain syndrome: Secondary | ICD-10-CM | POA: Diagnosis not present

## 2024-07-25 DIAGNOSIS — G9341 Metabolic encephalopathy: Secondary | ICD-10-CM | POA: Diagnosis not present

## 2024-07-25 DIAGNOSIS — K529 Noninfective gastroenteritis and colitis, unspecified: Secondary | ICD-10-CM | POA: Diagnosis not present

## 2024-07-25 DIAGNOSIS — J9601 Acute respiratory failure with hypoxia: Secondary | ICD-10-CM | POA: Diagnosis not present

## 2024-07-25 DEATH — deceased

## 2024-08-22 NOTE — Death Summary Note (Signed)
 "  DEATH SUMMARY   Patient Details  Name: Randall Mccormick MRN: [id number] DOB: [birth year] ERE:[name], [name], MD Admission/Discharge Information   Admit Date:  August 07, 2024  Date of Death: Date of Death: August 23, 2024  Time of Death: Time of Death: 0815  Length of Stay: 2024/09/07   Principle Cause of death: Sepsis-aspiration pneumonia  Hospital Diagnoses: Principal Problem:   Sepsis (HCC) Active Problems:   Acute metabolic encephalopathy   Enteritis   Hyperlipidemia   Hypertension   Depression with anxiety   Gouty arthropathy   Chronic pain syndrome   Incomplete paraplegia (HCC)   Acute respiratory failure with hypoxia (HCC)   Hyperbilirubinemia   Infectious diarrhea   Hospital Course: \Patient is a 69 y.o.  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 difficile colitis, hospital course complicated by progressive metabolic/toxic encephalopathy-with worsening hypoxic respiratory failure-requiring transfer to the ICU and mechanical intubation.  He was stabilized and then transferred back to [facility] service on 1/29.   Significant events: 1/16>> mid to [facility] 1/20>> worsening encephalopathy-respiratory distress-started on BiPAP 1/21>> transferred to ICU-intubated 1/27>> extubated 1/29>> transferred to [facility] 1/31>> tachypneic-appears to be using accessory muscles-PCCM consulted-spoke with patient's spouse-DNR.  Subsequently aspirated later in the evening-developed worsening hypoxemia-transitioned to comfort care.   Significant studies: 1/16>> CT abdomen/pelvis: Fluid throughout colon consistent with diarrheal state/enteritis. 1/20>> CXR: Patchy right lower lobe opacity 1/20>> CT head: No acute intracranial abnormality 1/21>> MRI brain: No acute intracranial abnormality 1/26>> MRI brain: No acute intracranial  abnormality 1/26>> MRI cervical spine: No acute abnormality-no fracture-multilevel cervical spondylosis. 1/26>> EEG: No seizures   Significant microbiology data: 1/16>> COVID/influenza/RSV PCR: Negative 1/16>> stool C. difficile studies (antigen/toxin): Positive 1/16>> stool GI pathogen panel: Negative 1/16>> blood culture: No growth 1/21>> tracheal aspirate: Moderate Candida albicans.   Procedures: See above   Consults: PCCM Palliative care    Assessment and Plan: Severe sepsis secondary to C. difficile colitis Initially on IV vancomycin /oral Flagyl -all antibiotics were stopped when patient was transitioned to full comfort measures.   Acute toxic metabolic encephalopathy Felt to be secondary to sepsis-and possibly a toxic component from sedating medications Continue to have encephalopathy-that gradually improved.  MRI brain/Spot EEG was stable. Plan was to minimize  sedating medications as much as possible Once transitioned to comfort care on 1/31-he was started on morphine  infusion.   Acute hypoxic respiratory failure s/p [id number]-121/127 Felt to be secondary to a combination of encephalopathy/sepsis physiology/atelectasis Although overall improved and stable on just 2 L of oxygen but given frailty-remains at risk for aspiration and subsequent decompensation.  Unfortunately-had an aspiration event on 1/31 evening-and was transitioned to comfort measures.   Respiratory distress-mild hypercarbia-concern for neuromuscular fatigue-has history of thoracic myelopathy resulting in incomplete paraplegia/neurogenic bladder On 1/31-and noted to be using his accessory muscles-particularly his neck muscles/trapezius for respiration. ABG showing mild hypercarbia but pH stable Concerned that his neuromuscular fatigue (likely worsened due to critical illness myopathy) will continue to worsen-and that he may need to go back on the ventilator at some point in time.  Subsequently extensive discussion  was held with patient's spouse over the phone-after much discussion (see prior notes)-DNR order placed.  Unfortunately-later in the day on 1/31 he aspirated-had worsening hypoxemia-and was transitioned to full comfort measures.     AKI Likely hemodynamically mediated-improved   Oropharyngeal dysphagia Secondary to critical illness related debility/deconditioning As noted above-he was doing relatively  well-but on 1/31-had a aspiration event-while eating-he was transitioned to comfort measures.  Once transitioned to comfort measures-Cortak  tube was discontinued.   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   Chronic pain On gabapentin  and as needed oxycodone  chronically-given encephalopathy-all narcotics were held-see above documentation.   History of depression/anxiety Was on Prozac  but olanzapine /Xanax  was held due to encephalopathy/frailty-with plans to only resume when clinically improved.   GERD Was on Pepcid    HLD Was on statin/Zetia    HTN Was on amlodipine -but has been discontinued once comfort measures were started.   History of gout Stable-no flare-was on allopurinol  but this was stopped once comfort measures started.   Palliative care See above discussion Palliative care consulted-appreciate input. DNR order placed on 1/31 after discussion with spouse-but  later on that day-patient had an aspiration event-had worsening hypoxemia-after further discussion with family-patient was transition to full comfort measures and started on morphine  infusion.     The results of significant diagnostics from this hospitalization (including imaging, microbiology, ancillary and laboratory) are listed below for reference.   Significant Diagnostic Studies: DG Chest Port 1V same Day Result  Date: 07/24/2024 CLINICAL DATA:  [id number] SOB (shortness of breath) [id number] EXAM: PORTABLE CHEST 1 VIEW COMPARISON:  July 23, 2024 FINDINGS: The cardiomediastinal silhouette is unchanged in contour.Low lung volume radiograph. The enteric tube courses through the chest to the abdomen beyond the field-of-view. No definitive pleural effusion. No pneumothorax. Mild diffuse reticular prominence. LEFT-sided rib fractures. IMPRESSION: Mild diffuse reticular prominence which may reflect mild pulmonary edema versus bronchovascular crowding. Electronically Signed   By: Corean Salter M.D.   On: 07/24/2024 10:37   DG Chest Port 1V same Day Result Date: 07/23/2024 CLINICAL DATA:  Shortness of breath EXAM: PORTABLE CHEST 1 VIEW COMPARISON:  07/14/2024 FINDINGS: Single frontal view of the chest demonstrates enteric catheter passing below diaphragm, tip excluded by collimation. Extensive postsurgical changes of the thoracolumbar spine are again noted, with stable hardware in place. Cardiac silhouette is unremarkable. Lung volumes are diminished, with crowding of the pulmonary vasculature. Scattered areas of consolidation at the lung bases are again noted, favor atelectasis. No effusion or pneumothorax. IMPRESSION: 1. Low lung volumes, with bibasilar atelectasis and crowding of the pulmonary vasculature unchanged. Electronically Signed   By: Ozell Daring M.D.   On: 07/23/2024 15:50   MR CERVICAL SPINE WO CONTRAST Result Date: 07/20/2024 CLINICAL DATA:  Initial evaluation for compression fracture, right upper extremity paralysis. EXAM: MRI CERVICAL SPINE WITHOUT CONTRAST TECHNIQUE: Multiplanar, multisequence MR imaging of the cervical spine was performed. No intravenous contrast was administered. COMPARISON:  Prior MRI from 09/30/2022. FINDINGS: Alignment: Straightening of the normal cervical lordosis. No significant listhesis. Vertebrae: Vertebral body height maintained without acute or chronic fracture. Bone marrow  signal intensity diffusely decreased on T1 weighted imaging, nonspecific, but most commonly related to anemia, smoking, or obesity. No discrete or worrisome osseous lesions. No significant abnormal marrow edema. Cord: Normal signal and morphology. Posterior Fossa, vertebral arteries, paraspinal tissues: Patchy signal abnormality within the pons, most characteristic of chronic microvascular ischemic disease. Craniocervical junction within normal limits. Paraspinous soft tissues demonstrate no acute finding. Endotracheal and enteric tubes in place. Postobstructive secretions noted within the oropharynx and nasopharynx. Preserved flow voids seen within the vertebral arteries bilaterally. Disc levels: C2-C3: Anterior endplate spurring without significant disc bulge. No significant spinal stenosis. Foramina  remain patent. C3-C4: Advanced degenerative vertebral disc space narrowing with diffuse disc osteophyte complex, slightly asymmetric to the right. Mild to moderate left facet hypertrophy. Flattening and partial effacement of the ventral thecal sac. Resultant mild-to-moderate spinal stenosis. Severe right worse than left C4 foraminal narrowing. C4-C5: Mild diffuse disc bulge with bilateral uncovertebral hypertrophy. Flattening and partial effacement of the ventral thecal sac. Mild right-sided facet spurring. No significant spinal stenosis. Severe left with moderate right C5 foraminal narrowing. C5-C6: Diffuse disc bulge with bilateral uncovertebral spurring. Superimposed small central disc protrusion indents the ventral thecal sac (series 13, image 27). No significant spinal stenosis. Severe right worse than left C6 foraminal narrowing. C6-C7: Degenerative disc space narrowing with diffuse disc osteophyte complex, asymmetric to the left. Flattening of the ventral thecal sac with minimal flattening of the left ventral cord, but no cord signal changes or significant spinal stenosis. Severe left with moderate right C7  foraminal stenosis. C7-T1: Mild disc bulge with uncovertebral spurring. Moderate left facet hypertrophy. No spinal stenosis. Mild left C8 foraminal narrowing. Right neural foramina remains patent. IMPRESSION: 1. No acute abnormality within the cervical spine. No fracture. Normal MRI appearance of the cervical spinal cord. 2. Multilevel cervical spondylosis with resultant mild to moderate spinal stenosis at C3-4. No other significant spinal stenosis within the cervical spine. 3. Multifactorial degenerative changes with resultant multilevel foraminal narrowing as above. Notable findings include severe right worse than left C4 and C6 foraminal stenosis, severe left with moderate right C5 and C7 foraminal narrowing, with mild left C8 foraminal stenosis. Electronically Signed   By: Morene Hoard M.D.   On: 07/20/2024 01:03   MR BRAIN WO CONTRAST Result Date: 07/20/2024 CLINICAL DATA:  Initial evaluation for acute mental status change, unknown cause. EXAM: MRI HEAD WITHOUT CONTRAST TECHNIQUE: Multiplanar, multiecho pulse sequences of the brain and surrounding structures were obtained without intravenous contrast. COMPARISON:  Prior MRI from 07/14/2024. FINDINGS: Brain: Cerebral volume within normal limits for age. Mild patchy T2/FLAIR hyperintensity patchy T2/FLAIR hyperintensity involving the periventricular deep white matter both cerebral hemispheres, most likely related to chronic microvascular ischemic disease, mild for age. No abnormal foci of restricted diffusion to suggest acute or subacute ischemia. Gray-white matter differentiation maintained. No areas of chronic cortical infarction. No acute or significant chronic intracranial blood products. No mass lesion, midline shift or mass effect. Stable ventricular size without hydrocephalus. No extra-axial fluid collection. Pituitary gland suprasellar region within normal limits. Vascular: Major intracranial vascular flow voids are maintained. Skull and upper  cervical spine: Craniocervical junction within normal limits. Decreased T1 signal intensity within the bone marrow of the visualized upper cervical spine, nonspecific, but most commonly related to anemia, smoking, or obesity. No scalp soft tissue abnormality. Sinuses/Orbits: Prior ocular lens replacement on the left. Globes orbital soft tissues demonstrate no acute finding. Paranasal sinuses are largely clear. Trace left mastoid effusion noted, of doubtful significance. Other: None. IMPRESSION: 1. No acute intracranial abnormality. 2. Mild chronic microvascular ischemic disease for age. Electronically Signed   By: Morene Hoard M.D.   On: 07/20/2024 00:56   EEG adult Result Date: 07/19/2024 Shelton Arlin KIDD, MD     07/19/2024  5:07 PM Patient Name: Esiquio Boesen MRN: [id number] Epilepsy Attending: Arlin KIDD Shelton Referring Physician/Provider: Sharps Toribio BROCKS, MD Date: 07/19/2024 Duration: 22.07 mins Patient history: 69yo M with ams. EEG to evaluate for seizure Level of alertness: Awake/ lethargic AEDs during EEG study: None Technical aspects: This EEG study was done with scalp electrodes positioned according to the  10-20 International system of electrode placement. Electrical activity was reviewed with band pass filter of 1-70Hz , sensitivity of 7 uV/mm, display speed of 65mm/sec with a 60Hz  notched filter applied as appropriate. EEG data were recorded continuously and digitally stored.  Video monitoring was available and reviewed as appropriate. Description: EEG showed continuous generalized predominantly 5 to 7 Hz theta slowing admixed with intermittent 2-3hz  delta slowing. Hyperventilation and photic stimulation were not performed.   ABNORMALITY - Continuous slow, generalized IMPRESSION: This study is suggestive of moderate diffuse encephalopathy. No seizures or epileptiform discharges were seen throughout the recording. Arlin MALVA Krebs   DG Abd 1 View Result Date: 07/16/2024 EXAM: 1 VIEW XRAY OF THE  ABDOMEN 07/16/2024 04:39:00 PM COMPARISON: 12/24/2023 CLINICAL HISTORY: Toxic megacolon. FINDINGS: LINES, TUBES AND DEVICES: Enteric feeding tube in place with tip projecting over the distal stomach. BOWEL: Nonobstructive bowel gas pattern. SOFT TISSUES: Right upper quadrant surgical clips noted. No abnormal calcifications. BONES: Extensive thoracic and lumbar spinal surgical hardware noted. Old healed left rib fractures. No acute fracture. IMPRESSION: 1. Enteric feeding tube tip projects over the distal stomach. Electronically signed by: Oneil Devonshire MD 07/16/2024 07:30 PM EST RP Workstation: [id number]   MR BRAIN WO CONTRAST Result Date: 07/14/2024 EXAM: MRI BRAIN WITHOUT CONTRAST 07/14/2024 03:47:56 PM TECHNIQUE: Multiplanar multisequence MRI of the head/brain was performed without the administration of intravenous contrast. COMPARISON: MRI of the head dated 04/09/2022. CLINICAL HISTORY: Encephalopathy (Ped 0-17y). FINDINGS: BRAIN AND VENTRICLES: No acute infarct. No intracranial hemorrhage. No mass. No midline shift. No hydrocephalus. The sella is unremarkable. Normal flow voids. There is age-related cerebral volume loss and mild periventricular and subcortical white matter disease. ORBITS: No significant abnormality. SINUSES AND MASTOIDS: No significant abnormality. BONES AND SOFT TISSUES: Normal marrow signal. No soft tissue abnormality. The patient is status post left lens replacement. IMPRESSION: 1. No acute intracranial abnormality. 2. Age-related cerebral volume loss and mild periventricular and subcortical white matter disease. Electronically signed by: Evalene Coho MD 07/14/2024 03:53 PM EST RP Workstation: [id number]   DG CHEST PORT 1 VIEW Result Date: 07/14/2024 CLINICAL DATA:  Central line placement.  Ventilator dependence. EXAM: PORTABLE CHEST 1 VIEW COMPARISON:  07/13/2024 FINDINGS: Interval intubation. Endotracheal tube tip is approximately 3.9 cm above the base of the carina. The NG tube  passes into the stomach although the distal tip position is not included on the film. Left IJ central line is new in the interval with the tip overlying the proximal mid SVC level. Low volume film with vascular congestion and bibasilar atelectasis with probable right greater than left small layering effusions. Thoracolumbar fusion hardware has been incompletely visualized. Multiple old left rib fractures evident. IMPRESSION: 1. Interval intubation with endotracheal tube tip approximately 3.9 cm above the base of the carina. 2. Left IJ central line tip overlies the proximal mid SVC level. No pneumothorax. 3. Low volume film with vascular congestion and bibasilar atelectasis. Electronically Signed   By: Camellia Candle M.D.   On: 07/14/2024 07:00   EEG adult Result Date: 07/13/2024 Krebs Arlin MALVA, MD     07/13/2024  4:56 PM Patient Name: Calyn Rubi MRN: [id number] Epilepsy Attending: Arlin MALVA Krebs Referring Physician/Provider: Sonjia Held, MD Date: 07/13/2024 Duration: 22.55 mins Patient history: 69yo M with ams. EEG to evaluate for seizure Level of alertness: Asleep/ lethargic AEDs during EEG study: GBP Technical aspects: This EEG study was done with scalp electrodes positioned according to the 10-20 International system of electrode placement. Electrical activity was reviewed with band pass  filter of 1-70Hz , sensitivity of 7 uV/mm, display speed of 96mm/sec with a 60Hz  notched filter applied as appropriate. EEG data were recorded continuously and digitally stored.  Video monitoring was available and reviewed as appropriate. Description: EEG showed continuous generalized 3-6hz  theta-delta slowing. Sleep was characterized by sleep spindles (12 to 14 Hz), maximal frontocentral region. Hyperventilation and photic stimulation were not performed.   IMPRESSION: This study is suggestive of generalized cerebral dysfunction ( encephalopathy). No seizures or epileptiform discharges were seen throughout the recording.  Priyanka MALVA Krebs   CT HEAD WO CONTRAST ( ) Result Date: 07/13/2024 EXAM: CT HEAD WITHOUT CONTRAST 07/13/2024 11:54:29 AM TECHNIQUE: CT of the head was performed without the administration of intravenous contrast. Automated exposure control, iterative reconstruction, and/or weight based adjustment of the mA/kV was utilized to reduce the radiation dose to as low as reasonably achievable. COMPARISON: Brain MRI 04/09/2022, non-contrast head CT 12/24/2023. CLINICAL HISTORY: 69 year old male with encephalopathy and recent fever. FINDINGS: BRAIN AND VENTRICLES: No acute hemorrhage. No evidence of acute infarct. No hydrocephalus. No extra-axial collection. No mass effect or midline shift. Stable brain volume since last year. Gray white differentiation stable and within normal limits for age. No suspicious intracranial vascular hyperdensity. Calcified atherosclerosis at the skull base. ORBITS: Foreign body at the floor of the right orbit (series 4 image 6). No acute orbital soft tissue finding. SINUSES: Visible paranasal sinuses, tympanic cavities and mastoids are well aerated. SOFT TISSUES AND SKULL: No acute scalp soft tissue finding. No skull fracture. IMPRESSION: 1. Stable, negative for age non-contrast CT appearance of the brain. Electronically signed by: Helayne Hurst MD 07/13/2024 12:18 PM EST RP Workstation: [id number]   DG CHEST PORT 1 VIEW Result Date: 07/13/2024 EXAM: 1 VIEW(S) XRAY OF THE CHEST 07/13/2024 02:23:22 AM COMPARISON: 07/09/2024 CLINICAL HISTORY: Respiratory compromise FINDINGS: LUNGS AND PLEURA: Low lung volumes. Patchy right lower lobe opacity, likely atelectasis. Mild eventration of the right hemidiaphragm. No pleural effusion. No pneumothorax. HEART AND MEDIASTINUM: No acute abnormality of the cardiac and mediastinal silhouettes. BONES AND SOFT TISSUES: Old left rib fractures. Lower thoracic/upper lumbar spine fixation hardware, incompletely visualized. Cholecystectomy clips. IMPRESSION: 1.  Patchy right lower lobe opacity, likely atelectasis. Electronically signed by: Pinkie Pebbles MD 07/13/2024 02:28 AM EST RP Workstation: [id number]   CT ABDOMEN PELVIS W CONTRAST Result Date: 07/09/2024 EXAM: CT ABDOMEN AND PELVIS WITH CONTRAST 07/09/2024 08:43:05 PM TECHNIQUE: CT of the abdomen and pelvis was performed with the administration of 100 mL of iohexol  (OMNIPAQUE ) 350 MG/ML injection. Multiplanar reformatted images are provided for review. Automated exposure control, iterative reconstruction, and/or weight-based adjustment of the mA/kV was utilized to reduce the radiation dose to as low as reasonably achievable. COMPARISON: 05/15/2024 CLINICAL HISTORY: Abdominal pain and fevers. FINDINGS: LOWER CHEST: Mild atelectatic changes are noted in the bases bilaterally. LIVER: The liver is within normal limits. GALLBLADDER AND BILE DUCTS: The gallbladder has been surgically removed. No biliary ductal dilatation. SPLEEN: No acute abnormality. PANCREAS: No acute abnormality. ADRENAL GLANDS: Adrenal glands are unremarkable. KIDNEYS, URETERS AND BLADDER: Kidneys demonstrate a normal enhancement pattern. No renal calculi or obstructing changes are seen. No hydronephrosis. No perinephric or periureteral stranding. Bladder is decompressed. GI AND BOWEL: The stomach is decompressed. The small bowel is unremarkable. Fluid is noted throughout the colon consistent with a diarrheal state. The appendix has been surgically removed. There is no bowel obstruction. PERITONEUM AND RETROPERITONEUM: No ascites. No free air. VASCULATURE: Aortic calcifications were seen without any aneurysmal dilatation. LYMPH NODES: No lymphadenopathy. REPRODUCTIVE ORGANS: No  acute abnormality. BONES AND SOFT TISSUES: Extensive orthopedic hardware is noted in the lower lumbar spine. No acute osseous abnormality. No focal soft tissue abnormality. IMPRESSION: 1. Fluid throughout the colon consistent with a diarrheal state/enteritis; no other acute  abnormality in the abdomen or pelvis. Electronically signed by: Oneil Devonshire MD 07/09/2024 08:52 PM EST RP Workstation: [id number]   DG Chest Port 1 View Result Date: 07/09/2024 CLINICAL DATA:  Possible sepsis EXAM: PORTABLE CHEST 1 VIEW COMPARISON:  05/15/2024 FINDINGS: Hypoventilatory changes. Suspected elevation of the right diaphragm. No pleural effusion. Difficult to exclude airspace disease at left base. Cardiomegaly with aortic atherosclerosis IMPRESSION: 1. Hypoventilatory changes with suspected elevation of the right diaphragm. Difficult to exclude airspace disease at the left base. 2. Cardiomegaly. Electronically Signed   By: Luke Bun M.D.   On: 07/09/2024 18:44    Microbiology: No results found for this or any previous visit (from the past 240 hours).  Time spent: 35 minutes  Signed: Donalda Applebaum, MD 08/21/2024   "

## 2024-08-22 NOTE — Progress Notes (Signed)
 "                        PROGRESS NOTE        PATIENT DETAILS Name: Randall Mccormick Age: 69 y.o. Sex: male Date of Birth: Jun 03, 1956 Admit Date: 07/09/2024 Admitting Physician Jorie JONELLE Blanch, MD ERE:[name], [name], MD  Brief Summary: Patient is a 69 y.o.  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 difficile colitis, hospital course complicated by progressive metabolic/toxic encephalopathy-with worsening hypoxic respiratory failure-requiring transfer to the ICU and mechanical intubation.  He was stabilized and then transferred back to [facility] service on 1/29.  Significant events: 1/16>> mid to TRH 1/20>> worsening encephalopathy-respiratory distress-started on BiPAP 1/21>> transferred to ICU-intubated 1/27>> extubated 1/29>> transferred to [facility] 1/31>> tachypneic-appears to be using accessory muscles-PCCM consulted-spoke with patient's spouse-DNR.  Subsequently aspirated later in the evening-developed worsening hypoxemia-transitioned to comfort care.  Significant studies: 1/16>> CT abdomen/pelvis: Fluid throughout colon consistent with diarrheal state/enteritis. 1/20>> CXR: Patchy right lower lobe opacity 1/20>> CT head: No acute intracranial abnormality 1/21>> MRI brain: No acute intracranial abnormality 1/26>> MRI brain: No acute intracranial abnormality 1/26>> MRI cervical spine: No acute abnormality-no fracture-multilevel cervical spondylosis. 1/26>> EEG: No seizures  Significant microbiology data: 1/16>> COVID/influenza/RSV PCR: Negative 1/16>> stool C. difficile studies (antigen/toxin): Positive 1/16>> stool GI pathogen panel: Negative 1/16>> blood culture: No growth 1/21>> tracheal aspirate: Moderate Candida albicans.  Procedures: See above  Consults: PCCM Palliative  care  Subjective: Hypotensive-barely arousable-apneic spells-in between tachypnea.  Objective: Vitals: Blood pressure (!) 65/42, pulse (!) 119, temperature 98.9 F (37.2 C), temperature source Axillary, resp. rate (!) 38, height 5' 8 (1.727 m), weight 105 kg, SpO2 (!) 73%.   Exam: Essentially unresponsive Appears comfortable.  Pertinent Labs/Radiology:    Latest Ref Rng & Units 07/24/2024    3:54 AM 07/23/2024    6:13 AM 07/22/2024    3:40 AM  CBC  WBC 4.0 - 10.5 K/uL 19.3  19.2  20.7   Hemoglobin 13.0 - 17.0 g/dL 87.3  87.3  86.8   Hematocrit 39.0 - 52.0 % 38.3  38.0  40.5   Platelets 150 - 400 K/uL 535  459  397     Lab Results  Component Value Date   NA 141 07/24/2024   K 4.2 07/24/2024   CL 103 07/24/2024   CO2 30 07/24/2024      Assessment/Plan: Severe sepsis secondary to C. difficile colitis Initially on IV vancomycin /oral Flagyl -all antibiotics were stopped when patient was transitioned to full comfort measures.  Acute toxic metabolic encephalopathy Felt to be secondary to sepsis-and possibly a toxic component from sedating medications Continue to have encephalopathy-that gradually improved.  MRI brain/Spot EEG was stable. Plan was to minimize  sedating medications as much as possible Once transition to comfort care on 1/31-he was started on morphine  infusion.  Acute hypoxic respiratory failure s/p ETT-121/127 Felt to be secondary to a combination of encephalopathy/sepsis physiology/atelectasis Although overall improved and stable on just 2 L of oxygen but given frailty-remains at risk for aspiration and subsequent decompensation.  Unfortunately-had an aspiration event on 1/31 evening-and was transition to comfort measures.  Respiratory distress-mild hypercarbia-concern for neuromuscular fatigue-has history of thoracic myelopathy resulting in incomplete paraplegia/neurogenic bladder On 1/31-and noted to be using his accessory muscles-particularly his neck  muscles/trapezius for respiration. ABG showing mild hypercarbia but pH stable Concerned  that his neuromuscular fatigue (likely worsened due to critical illness myopathy) will continue to worsen-and that he may need to go back on the ventilator at some point in time.  Subsequently extensive discussion was held with patient's spouse over the phone-after much discussion (see prior notes)-DNR order placed.  Unfortunately-later in the day on 1/31 he aspirated-had worsening hypoxemia-and was transitioned to full comfort measures.    AKI Likely hemodynamically mediated-improved  Oropharyngeal dysphagia Secondary to critical illness related debility/deconditioning As noted above-he was doing relatively well-but on 1/31-had a aspiration event-while eating-in was transition to comfort measures.  Once transitioned to comfort measures-Cortak  tube was discontinued.  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  Chronic pain On gabapentin  and as needed oxycodone  chronically-given encephalopathy-all narcotics were held-see above documentation.  History of depression/anxiety Was on Prozac  but olanzapine /Xanax  was held due to encephalopathy/frailty-with plans to only resume when clinically improved.  GERD Was on Pepcid   HLD Was on statin/Zetia   HTN Was on amlodipine -but has been discontinued once comfort measures were started.  History of gout Stable-no flare-was on allopurinol  but this was stopped once comfort measures started.  Palliative care See above discussion Palliative care consulted-appreciate input. DNR order placed on 1/31 after discussion with spouse-but  later on that day-patient had an aspiration event-had worsening hypoxemia-after further discussion with family-patient was transition  to full comfort measures and started on morphine  infusion.  This morning-he appears to have apneic spells and is very close to end-of-life-continue full comfort measures-anticipate death in a matter of few hours.  Nutrition Status: Nutrition Problem: Inadequate oral intake Etiology: inability to eat Signs/Symptoms: NPO status Interventions: Tube feeding, Magic cup, MVI  Pressure Ulcer: Wound 07/14/24 0535 Pressure Injury Vertebral column Lower Stage 1 -  Intact skin with non-blanchable redness of a localized area usually over a bony prominence. (Active)   Class 2 Obesity: Estimated body mass index is 35.2 kg/m as calculated from the following:   Height as of this encounter: 5' 8 (1.727 m).   Weight as of this encounter: 105 kg.   Code status:   Code Status: Do not attempt resuscitation (DNR) - Comfort care   DVT Prophylaxis:SQ Lovenox     Family Communication: Spouse-Lynn Sones-[phone] updated 1/29   Disposition Plan: Status is: Inpatient Remains inpatient appropriate because: Severity of illness   Planned Discharge Destination: Inpatient death in a matter of few hours.   Diet: Diet Order             DIET - DYS 1 Fluid consistency: Nectar Thick  Diet effective now                     Antimicrobial agents: Anti-infectives (From admission, onward)    Start     Dose/Rate Route Frequency Ordered Stop   07/20/24 1800  vancomycin  (VANCOCIN ) 50 mg/mL oral solution SOLN 500 mg  Status:  Discontinued        500 mg Oral Every 6 hours 07/20/24 1457 07/24/24 1720   07/16/24 1800  vancomycin  (VANCOCIN ) 50 mg/mL oral solution SOLN 500 mg  Status:  Discontinued        500 mg Per Tube Every 6 hours 07/16/24 1456 07/20/24 1457   07/16/24 1600  metroNIDAZOLE  (FLAGYL ) IVPB 500 mg  Status:  Discontinued        500 mg 100 mL/hr over 60  Minutes Intravenous Every 8 hours 07/16/24 1456 07/24/24 1720   07/15/24 1545  piperacillin -tazobactam (ZOSYN ) IVPB 3.375 g  Status:   Discontinued        3.375 g 12.5 mL/hr over 240 Minutes Intravenous Every 8 hours 07/15/24 1458 07/16/24 1456   07/14/24 1800  vancomycin  (VANCOCIN ) 50 mg/mL oral solution SOLN 125 mg  Status:  Discontinued        125 mg Per Tube Every 6 hours 07/14/24 1611 07/16/24 1456   07/14/24 1400  piperacillin -tazobactam (ZOSYN ) IVPB 3.375 g  Status:  Discontinued        3.375 g 12.5 mL/hr over 240 Minutes Intravenous Every 8 hours 07/14/24 0700 07/14/24 1539   07/14/24 1230  vancomycin  (VANCOCIN ) 50 mg/mL oral solution SOLN 125 mg  Status:  Discontinued        125 mg Oral Every 6 hours 07/14/24 1144 07/14/24 1611   07/14/24 0800  vancomycin  (VANCOREADY) IVPB 2000 mg/400 mL        2,000 mg 200 mL/hr over 120 Minutes Intravenous  Once 07/14/24 0700 07/14/24 1049   07/14/24 0800  piperacillin -tazobactam (ZOSYN ) IVPB 3.375 g        3.375 g 100 mL/hr over 30 Minutes Intravenous  Once 07/14/24 0703 07/15/24 1518   07/10/24 1800  vancomycin  (VANCOCIN ) capsule 125 mg  Status:  Discontinued        125 mg Oral 4 times daily 07/10/24 1704 07/14/24 1144   07/10/24 0200  piperacillin -tazobactam (ZOSYN ) IVPB 3.375 g  Status:  Discontinued        3.375 g 12.5 mL/hr over 240 Minutes Intravenous Every 8 hours 07/09/24 2158 07/10/24 1703   07/09/24 1845  vancomycin  (VANCOREADY) IVPB 2000 mg/400 mL        2,000 mg 200 mL/hr over 120 Minutes Intravenous  Once 07/09/24 1831 07/09/24 2146   07/09/24 1830  ceFEPIme  (MAXIPIME ) 2 g in sodium chloride  0.9 % 100 mL IVPB        2 g 200 mL/hr over 30 Minutes Intravenous  Once 07/09/24 1826 07/09/24 1915   07/09/24 1830  metroNIDAZOLE  (FLAGYL ) IVPB 500 mg        500 mg 100 mL/hr over 60 Minutes Intravenous  Once 07/09/24 1826 07/09/24 2045   07/09/24 1830  vancomycin  (VANCOCIN ) IVPB 1000 mg/200 mL premix  Status:  Discontinued        1,000 mg 200 mL/hr over 60 Minutes Intravenous  Once 07/09/24 1826 07/09/24 1831        MEDICATIONS: Scheduled Meds:  scopolamine   1  patch Transdermal Q72H   sodium chloride  flush  3 mL Intravenous Q12H   Continuous Infusions:  sodium chloride      morphine  4 mg/hr (08/04/2024 0755)   PRN Meds:.sodium chloride , albuterol , artificial tears, [DISCONTINUED] glycopyrrolate  **OR** glycopyrrolate  **OR** glycopyrrolate , [DISCONTINUED] haloperidol  **OR** haloperidol  **OR** haloperidol  lactate, [DISCONTINUED] LORazepam  **OR** LORazepam  **OR** LORazepam , LORazepam , morphine , [DISCONTINUED] ondansetron  **OR** ondansetron  (ZOFRAN ) IV, sodium chloride  flush   I have personally reviewed following labs and imaging studies  LABORATORY DATA: CBC: Recent Labs  Lab 07/20/24 0553 07/21/24 0500 07/22/24 0340 07/23/24 0613 07/24/24 0354  WBC 28.0* 21.5* 20.7* 19.2* 19.3*  HGB 13.8 12.8* 13.1 12.6* 12.6*  HCT 41.7 40.1 40.5 38.0* 38.3*  MCV 97.4 99.3 99.5 97.7 97.5  PLT 356 330 397 459* 535*    Basic Metabolic Panel: Recent Labs  Lab 07/19/24 0416 07/19/24 1440 07/20/24 0553 07/21/24 0500 07/22/24 0340 07/23/24 0613 07/24/24 0354  NA 148*   < > 148* 145  139 138 141  K 4.9   < > 4.5 4.3 4.4 4.5 4.2  CL 114*  --  115* 112* 107 104 103  CO2 28  --  27 28 26 27 30   GLUCOSE 125*  --  122* 129* 127* 119* 133*  BUN 26*  --  26* 21 16 15 14   CREATININE 0.87  --  0.75 0.65 0.57* 0.52* 0.50*  CALCIUM  7.4*  --  7.4* 7.4* 7.6* 7.6* 7.9*  MG 2.7*  --  2.7* 2.5* 2.5* 2.2  --   PHOS 2.9  --  2.8 3.2 2.5 2.9  --    < > = values in this interval not displayed.    GFR: Estimated Creatinine Clearance: 102.3 mL/min (A) (by C-G formula based on SCr of 0.5 mg/dL (L)).  Liver Function Tests: Recent Labs  Lab 07/19/24 0416 07/20/24 0553 07/21/24 0500 07/22/24 0340 07/23/24 0613  AST 55* 40 28 29 47*  ALT 41 29 21 20 23   ALKPHOS 191* 136* 104 108 102  BILITOT 0.5 0.6 0.4 0.4 0.3  PROT 5.0* 5.0* 4.7* 5.2* 5.3*  ALBUMIN 1.8* 1.9* 1.9* 2.2* 2.0*   No results for input(s): LIPASE, AMYLASE in the last 168 hours.  Recent Labs   Lab 07/20/24 0554  AMMONIA 21    Coagulation Profile: No results for input(s): INR, PROTIME in the last 168 hours.  Cardiac Enzymes: No results for input(s): CKTOTAL, CKMB, CKMBINDEX, TROPONINI in the last 168 hours.   BNP (last 3 results) No results for input(s): PROBNP in the last 8760 hours.  Lipid Profile: No results for input(s): CHOL, HDL, LDLCALC, TRIG, CHOLHDL, LDLDIRECT in the last 72 hours.  Thyroid Function Tests: No results for input(s): TSH, T4TOTAL, FREET4, T3FREE, THYROIDAB in the last 72 hours.  Anemia Panel: No results for input(s): VITAMINB12, FOLATE, FERRITIN, TIBC, IRON, RETICCTPCT in the last 72 hours.  Urine analysis:    Component Value Date/Time   COLORURINE AMBER (A) 07/09/2024 1830   APPEARANCEUR CLEAR 07/09/2024 1830   LABSPEC 1.018 07/09/2024 1830   PHURINE 5.0 07/09/2024 1830   GLUCOSEU NEGATIVE 07/09/2024 1830   HGBUR SMALL (A) 07/09/2024 1830   BILIRUBINUR NEGATIVE 07/09/2024 1830   KETONESUR NEGATIVE 07/09/2024 1830   PROTEINUR 30 (A) 07/09/2024 1830   NITRITE NEGATIVE 07/09/2024 1830   LEUKOCYTESUR NEGATIVE 07/09/2024 1830    Sepsis Labs: Lactic Acid, Venous    Component Value Date/Time   LATICACIDVEN 1.5 07/17/2024 0450    MICROBIOLOGY: No results found for this or any previous visit (from the past 240 hours).   RADIOLOGY STUDIES/RESULTS: DG Chest Port 1V same Day Result Date: 07/24/2024 CLINICAL DATA:  141880 SOB (shortness of breath) [id number] EXAM: PORTABLE CHEST 1 VIEW COMPARISON:  July 23, 2024 FINDINGS: The cardiomediastinal silhouette is unchanged in contour.Low lung volume radiograph. The enteric tube courses through the chest to the abdomen beyond the field-of-view. No definitive pleural effusion. No pneumothorax. Mild diffuse reticular prominence. LEFT-sided rib fractures. IMPRESSION: Mild diffuse reticular prominence which may reflect mild pulmonary edema versus  bronchovascular crowding. Electronically Signed   By: Corean Salter M.D.   On: 07/24/2024 10:37   DG Chest Port 1V same Day Result Date: 07/23/2024 CLINICAL DATA:  Shortness of breath EXAM: PORTABLE CHEST 1 VIEW COMPARISON:  07/14/2024 FINDINGS: Single frontal view of the chest demonstrates enteric catheter passing below diaphragm, tip excluded by collimation. Extensive postsurgical changes of the thoracolumbar spine are again noted, with stable hardware in place. Cardiac silhouette is unremarkable. Lung  volumes are diminished, with crowding of the pulmonary vasculature. Scattered areas of consolidation at the lung bases are again noted, favor atelectasis. No effusion or pneumothorax. IMPRESSION: 1. Low lung volumes, with bibasilar atelectasis and crowding of the pulmonary vasculature unchanged. Electronically Signed   By: Ozell Daring M.D.   On: 07/23/2024 15:50     LOS: 16 days   Donalda Applebaum, MD  [facility]    To contact the attending provider between 7A-7P or the covering provider during after hours 7P-7A, please log into the web site [email or web address] and access using universal [place] password for that web site. If you do not have the password, please call the hospital operator.  08/18/2024, 8:18 AM    "

## 2024-08-22 NOTE — Progress Notes (Signed)
" ° °  Palliative Medicine Inpatient Follow Up Note   I stopped by [facility] room this morning.   Upon assessment he was noted to not be breathing and had no audible heart beat on ausculation.   I spoke with the charge RN who shared that Randall Mccormick had been pronounced around 8:15.   The PMT will remain available if any additional family support is needed.  No Charge ______________________________________________________________________________________ Randall Mccormick [place] Palliative Medicine Team Team Cell Phone: [phone] Please utilize secure chat with additional questions, if there is no response within 30 minutes please call the above phone number    "

## 2024-08-22 NOTE — Plan of Care (Signed)
" °  Problem: Fluid Volume: Goal: Hemodynamic stability will improve Outcome: Not Progressing   Problem: Clinical Measurements: Goal: Diagnostic test results will improve Outcome: Not Progressing Goal: Signs and symptoms of infection will decrease Outcome: Not Progressing   Problem: Respiratory: Goal: Ability to maintain adequate ventilation will improve Outcome: Not Progressing   Problem: Clinical Measurements: Goal: Ability to maintain clinical measurements within normal limits will improve Outcome: Not Progressing Goal: Will remain free from infection Outcome: Not Progressing Goal: Diagnostic test results will improve Outcome: Not Progressing Goal: Respiratory complications will improve Outcome: Not Progressing Goal: Cardiovascular complication will be avoided Outcome: Not Progressing   Problem: Activity: Goal: Risk for activity intolerance will decrease Outcome: Not Progressing   Problem: Nutrition: Goal: Adequate nutrition will be maintained Outcome: Not Progressing   Problem: Coping: Goal: Level of anxiety will decrease Outcome: Not Progressing   Problem: Safety: Goal: Ability to remain free from injury will improve Outcome: Not Progressing   Problem: Skin Integrity: Goal: Risk for impaired skin integrity will decrease Outcome: Not Progressing   Problem: Elimination: Goal: Will not experience complications related to bowel motility Outcome: Not Progressing Goal: Will not experience complications related to urinary retention Outcome: Not Progressing   Problem: Pain Managment: Goal: General experience of comfort will improve and/or be controlled Outcome: Not Progressing   Problem: Education: Goal: Knowledge of the prescribed therapeutic regimen will improve Outcome: Not Progressing   Problem: Coping: Goal: Ability to identify and develop effective coping behavior will improve Outcome: Not Progressing   Problem: Clinical Measurements: Goal: Quality of  life will improve Outcome: Not Progressing   Problem: Respiratory: Goal: Verbalizations of increased ease of respirations will increase Outcome: Not Progressing   Problem: Role Relationship: Goal: Family's ability to cope with current situation will improve Outcome: Not Progressing Goal: Ability to verbalize concerns, feelings, and thoughts to partner or family member will improve Outcome: Not Progressing   Problem: Pain Management: Goal: Satisfaction with pain management regimen will improve Outcome: Not Progressing   "

## 2024-08-22 NOTE — Progress Notes (Signed)
 Pt has expired at this time, no heart beat apical or pulse felt radial. Notified Dr Raenelle at this time. Manisha, RN second to pronounce.

## 2024-08-22 DEATH — deceased
